# Patient Record
Sex: Male | Born: 1940 | Race: White | Hispanic: No | Marital: Married | Smoking: Former smoker
Health system: Southern US, Community
[De-identification: ages and names within clinical notes are randomized; demographics above are authoritative.]

## PROBLEM LIST (undated history)

## (undated) DIAGNOSIS — H5200 Hypermetropia, unspecified eye: Secondary | ICD-10-CM

## (undated) DIAGNOSIS — T8859XA Other complications of anesthesia, initial encounter: Secondary | ICD-10-CM

## (undated) DIAGNOSIS — H524 Presbyopia: Secondary | ICD-10-CM

## (undated) DIAGNOSIS — M48062 Spinal stenosis, lumbar region with neurogenic claudication: Secondary | ICD-10-CM

## (undated) DIAGNOSIS — G7000 Myasthenia gravis without (acute) exacerbation: Secondary | ICD-10-CM

## (undated) DIAGNOSIS — C439 Malignant melanoma of skin, unspecified: Secondary | ICD-10-CM

## (undated) DIAGNOSIS — T4145XA Adverse effect of unspecified anesthetic, initial encounter: Secondary | ICD-10-CM

## (undated) DIAGNOSIS — R7303 Prediabetes: Secondary | ICD-10-CM

## (undated) DIAGNOSIS — Z9289 Personal history of other medical treatment: Secondary | ICD-10-CM

## (undated) DIAGNOSIS — Z8719 Personal history of other diseases of the digestive system: Secondary | ICD-10-CM

## (undated) DIAGNOSIS — C8590 Non-Hodgkin lymphoma, unspecified, unspecified site: Secondary | ICD-10-CM

## (undated) DIAGNOSIS — H3322 Serous retinal detachment, left eye: Secondary | ICD-10-CM

## (undated) DIAGNOSIS — E119 Type 2 diabetes mellitus without complications: Secondary | ICD-10-CM

## (undated) DIAGNOSIS — J189 Pneumonia, unspecified organism: Secondary | ICD-10-CM

## (undated) DIAGNOSIS — K219 Gastro-esophageal reflux disease without esophagitis: Secondary | ICD-10-CM

## (undated) DIAGNOSIS — M1990 Unspecified osteoarthritis, unspecified site: Secondary | ICD-10-CM

## (undated) DIAGNOSIS — I499 Cardiac arrhythmia, unspecified: Secondary | ICD-10-CM

## (undated) DIAGNOSIS — I10 Essential (primary) hypertension: Secondary | ICD-10-CM

## (undated) HISTORY — DX: Myasthenia gravis without (acute) exacerbation: G70.00

## (undated) HISTORY — DX: Hypermetropia, unspecified eye: H52.00

## (undated) HISTORY — PX: MIDDLE EAR SURGERY: SHX713

## (undated) HISTORY — PX: COLONOSCOPY: SHX174

## (undated) HISTORY — DX: Spinal stenosis, lumbar region with neurogenic claudication: M48.062

## (undated) HISTORY — DX: Malignant melanoma of skin, unspecified: C43.9

## (undated) HISTORY — DX: Presbyopia: H52.4

## (undated) HISTORY — PX: HERNIA REPAIR: SHX51

## (undated) HISTORY — DX: Essential (primary) hypertension: I10

---

## 2001-08-23 ENCOUNTER — Ambulatory Visit (HOSPITAL_COMMUNITY): Admission: RE | Admit: 2001-08-23 | Discharge: 2001-08-23 | Payer: Self-pay | Admitting: Gastroenterology

## 2001-08-23 ENCOUNTER — Encounter (INDEPENDENT_AMBULATORY_CARE_PROVIDER_SITE_OTHER): Payer: Self-pay | Admitting: Specialist

## 2004-10-23 ENCOUNTER — Encounter (INDEPENDENT_AMBULATORY_CARE_PROVIDER_SITE_OTHER): Payer: Self-pay | Admitting: Family Medicine

## 2004-10-23 LAB — CONVERTED CEMR LAB

## 2004-12-05 ENCOUNTER — Encounter (INDEPENDENT_AMBULATORY_CARE_PROVIDER_SITE_OTHER): Payer: Self-pay | Admitting: Specialist

## 2004-12-05 ENCOUNTER — Ambulatory Visit (HOSPITAL_COMMUNITY): Admission: RE | Admit: 2004-12-05 | Discharge: 2004-12-05 | Payer: Self-pay | Admitting: Gastroenterology

## 2005-04-11 ENCOUNTER — Ambulatory Visit (HOSPITAL_COMMUNITY): Admission: RE | Admit: 2005-04-11 | Discharge: 2005-04-11 | Payer: Self-pay | Admitting: Family Medicine

## 2006-06-15 ENCOUNTER — Ambulatory Visit: Payer: Self-pay | Admitting: Family Medicine

## 2006-06-21 ENCOUNTER — Ambulatory Visit: Payer: Self-pay | Admitting: Family Medicine

## 2006-07-19 ENCOUNTER — Ambulatory Visit: Payer: Self-pay | Admitting: Family Medicine

## 2006-07-27 ENCOUNTER — Ambulatory Visit (HOSPITAL_COMMUNITY): Admission: RE | Admit: 2006-07-27 | Discharge: 2006-07-27 | Payer: Self-pay | Admitting: *Deleted

## 2006-08-30 ENCOUNTER — Ambulatory Visit: Payer: Self-pay | Admitting: Family Medicine

## 2006-08-30 LAB — CONVERTED CEMR LAB
ALT: 26 units/L (ref 0–40)
Cholesterol: 258 mg/dL (ref 0–200)
HDL: 36.1 mg/dL — ABNORMAL LOW (ref >39.0)
LDL DIRECT: 163.5 mg/dL
Uric Acid, Serum: 6 mg/dL (ref 2.4–7.0)

## 2006-11-15 ENCOUNTER — Ambulatory Visit (HOSPITAL_COMMUNITY): Admission: RE | Admit: 2006-11-15 | Discharge: 2006-11-15 | Payer: Self-pay | Admitting: Orthopedic Surgery

## 2006-11-16 ENCOUNTER — Ambulatory Visit: Payer: Self-pay | Admitting: Family Medicine

## 2006-11-27 ENCOUNTER — Ambulatory Visit: Payer: Self-pay | Admitting: Family Medicine

## 2006-11-27 LAB — CONVERTED CEMR LAB
AST: 16 units/L (ref 0–37)
Cholesterol: 230 mg/dL (ref 0–200)
HDL: 40.2 mg/dL (ref >39.0)
Triglycerides: 275 mg/dL (ref 0–149)

## 2007-01-28 DIAGNOSIS — F528 Other sexual dysfunction not due to a substance or known physiological condition: Secondary | ICD-10-CM

## 2007-01-28 DIAGNOSIS — K219 Gastro-esophageal reflux disease without esophagitis: Secondary | ICD-10-CM

## 2007-10-24 HISTORY — PX: JOINT REPLACEMENT: SHX530

## 2007-12-19 ENCOUNTER — Ambulatory Visit: Payer: Self-pay | Admitting: Family Medicine

## 2007-12-19 DIAGNOSIS — E785 Hyperlipidemia, unspecified: Secondary | ICD-10-CM | POA: Insufficient documentation

## 2007-12-19 DIAGNOSIS — I10 Essential (primary) hypertension: Secondary | ICD-10-CM | POA: Insufficient documentation

## 2008-01-02 ENCOUNTER — Ambulatory Visit: Payer: Self-pay | Admitting: Family Medicine

## 2008-01-05 LAB — CONVERTED CEMR LAB
AST: 22 units/L (ref 0–37)
Cholesterol: 171 mg/dL (ref 0–200)
HDL: 45.3 mg/dL (ref >39.0)
Total CHOL/HDL Ratio: 3.8

## 2008-01-06 ENCOUNTER — Encounter (INDEPENDENT_AMBULATORY_CARE_PROVIDER_SITE_OTHER): Payer: Self-pay | Admitting: *Deleted

## 2008-01-30 ENCOUNTER — Ambulatory Visit: Payer: Self-pay | Admitting: Internal Medicine

## 2008-01-30 DIAGNOSIS — M1990 Unspecified osteoarthritis, unspecified site: Secondary | ICD-10-CM | POA: Insufficient documentation

## 2008-02-03 ENCOUNTER — Encounter (INDEPENDENT_AMBULATORY_CARE_PROVIDER_SITE_OTHER): Payer: Self-pay | Admitting: *Deleted

## 2008-02-03 LAB — CONVERTED CEMR LAB
BUN: 13 mg/dL (ref 6–23)
Basophils Relative: 0.5 % (ref 0.0–1.0)
CO2: 31 meq/L (ref 19–32)
Chloride: 103 meq/L (ref 96–112)
Creatinine, Ser: 1 mg/dL (ref 0.4–1.5)
Eosinophils Absolute: 0.2 10*3/uL (ref 0.0–0.7)
Eosinophils Relative: 2.5 % (ref 0.0–5.0)
Lymphocytes Relative: 23.5 % (ref 12.0–46.0)
MCV: 90.7 fL (ref 78.0–100.0)
Neutrophils Relative %: 63.6 % (ref 43.0–77.0)
RBC: 4.76 M/uL (ref 4.22–5.81)
WBC: 6.7 10*3/uL (ref 4.5–10.5)

## 2008-03-11 ENCOUNTER — Ambulatory Visit: Payer: Self-pay | Admitting: Internal Medicine

## 2008-03-13 ENCOUNTER — Encounter: Payer: Self-pay | Admitting: Internal Medicine

## 2008-03-24 ENCOUNTER — Inpatient Hospital Stay (HOSPITAL_COMMUNITY): Admission: RE | Admit: 2008-03-24 | Discharge: 2008-03-26 | Payer: Self-pay | Admitting: Orthopedic Surgery

## 2008-05-13 ENCOUNTER — Ambulatory Visit: Payer: Self-pay | Admitting: Internal Medicine

## 2008-05-18 LAB — CONVERTED CEMR LAB
HDL: 41.1 mg/dL (ref >39.0)
LDL Cholesterol: 99 mg/dL (ref 0–99)
Total CHOL/HDL Ratio: 4.2
VLDL: 31 mg/dL (ref 0–40)

## 2008-09-03 ENCOUNTER — Encounter (INDEPENDENT_AMBULATORY_CARE_PROVIDER_SITE_OTHER): Payer: Self-pay | Admitting: *Deleted

## 2008-11-12 ENCOUNTER — Ambulatory Visit: Payer: Self-pay | Admitting: Internal Medicine

## 2008-11-12 LAB — CONVERTED CEMR LAB
Glucose, Urine, Semiquant: NEGATIVE
Specific Gravity, Urine: <1.005
WBC Urine, dipstick: NEGATIVE
pH: 6

## 2008-11-16 LAB — CONVERTED CEMR LAB
ALT: 24 units/L (ref 0–53)
AST: 20 units/L (ref 0–37)
Basophils Absolute: 0 10*3/uL (ref 0.0–0.1)
GFR calc Af Amer: 109 mL/min
Glucose, Bld: 95 mg/dL (ref 70–99)
HCT: 41.7 % (ref 39.0–52.0)
Lymphocytes Relative: 22.6 % (ref 12.0–46.0)
Monocytes Absolute: 0.6 10*3/uL (ref 0.1–1.0)
Monocytes Relative: 9.9 % (ref 3.0–12.0)
Platelets: 225 10*3/uL (ref 150–400)
Potassium: 3.8 meq/L (ref 3.5–5.1)
RDW: 11.8 % (ref 11.5–14.6)
Sodium: 140 meq/L (ref 135–145)

## 2009-05-18 ENCOUNTER — Ambulatory Visit: Payer: Self-pay | Admitting: Internal Medicine

## 2009-05-28 LAB — CONVERTED CEMR LAB
ALT: 22 units/L (ref 0–53)
CO2: 31 meq/L (ref 19–32)
Calcium: 9 mg/dL (ref 8.4–10.5)
Chloride: 102 meq/L (ref 96–112)
Glucose, Bld: 104 mg/dL — ABNORMAL HIGH (ref 70–99)
Potassium: 4.3 meq/L (ref 3.5–5.1)
Sodium: 140 meq/L (ref 135–145)
Triglycerides: 222 mg/dL — ABNORMAL HIGH (ref 0.0–149.0)

## 2009-06-08 ENCOUNTER — Ambulatory Visit: Payer: Self-pay | Admitting: Internal Medicine

## 2009-11-17 ENCOUNTER — Telehealth (INDEPENDENT_AMBULATORY_CARE_PROVIDER_SITE_OTHER): Payer: Self-pay | Admitting: *Deleted

## 2009-11-19 ENCOUNTER — Telehealth (INDEPENDENT_AMBULATORY_CARE_PROVIDER_SITE_OTHER): Payer: Self-pay | Admitting: *Deleted

## 2009-11-19 ENCOUNTER — Ambulatory Visit: Payer: Self-pay | Admitting: Internal Medicine

## 2009-12-03 ENCOUNTER — Ambulatory Visit: Payer: Self-pay | Admitting: Internal Medicine

## 2009-12-06 LAB — CONVERTED CEMR LAB
BUN: 10 mg/dL (ref 6–23)
Basophils Absolute: 0 10*3/uL (ref 0.0–0.1)
CO2: 30 meq/L (ref 19–32)
Chloride: 103 meq/L (ref 96–112)
Glucose, Bld: 93 mg/dL (ref 70–99)
HCT: 42.1 % (ref 39.0–52.0)
Hemoglobin: 14.1 g/dL (ref 13.0–17.0)
Lymphs Abs: 1.6 10*3/uL (ref 0.7–4.0)
MCHC: 33.4 g/dL (ref 30.0–36.0)
MCV: 92.3 fL (ref 78.0–100.0)
Monocytes Absolute: 0.6 10*3/uL (ref 0.1–1.0)
Neutro Abs: 4.4 10*3/uL (ref 1.4–7.7)
Platelets: 205 10*3/uL (ref 150.0–400.0)
Potassium: 4.2 meq/L (ref 3.5–5.1)
RDW: 12 % (ref 11.5–14.6)
TSH: 0.61 microintl units/mL (ref 0.35–5.50)

## 2010-03-02 ENCOUNTER — Ambulatory Visit: Payer: Self-pay | Admitting: Internal Medicine

## 2010-03-04 LAB — CONVERTED CEMR LAB
HDL: 45.1 mg/dL (ref >39.00)
Total CHOL/HDL Ratio: 4
VLDL: 52.6 mg/dL — ABNORMAL HIGH (ref 0.0–40.0)

## 2010-05-31 ENCOUNTER — Encounter: Payer: Self-pay | Admitting: Internal Medicine

## 2010-06-03 ENCOUNTER — Telehealth (INDEPENDENT_AMBULATORY_CARE_PROVIDER_SITE_OTHER): Payer: Self-pay | Admitting: *Deleted

## 2010-08-04 ENCOUNTER — Encounter: Payer: Self-pay | Admitting: Internal Medicine

## 2010-08-11 ENCOUNTER — Encounter (INDEPENDENT_AMBULATORY_CARE_PROVIDER_SITE_OTHER): Payer: Self-pay | Admitting: *Deleted

## 2010-09-05 ENCOUNTER — Ambulatory Visit: Payer: Self-pay | Admitting: Internal Medicine

## 2010-11-24 NOTE — Assessment & Plan Note (Signed)
Summary: rto 6 months/cbs   Vital Signs:  Patient profile:   70 year old male Weight:      275.13 pounds Pulse rate:   66 / minute Pulse rhythm:   regular BP sitting:   132 / 82  (left arm) Cuff size:   large  Vitals Entered By: Army Fossa CMA (September 05, 2010 8:31 AM) CC: 6 month f/u- fasting  Comments [facility] [address]    History of Present Illness: routine office visit   diet has not been as good recently No exercising as much recently  due to aches and pains  Current Medications (verified): 1)  Meloxicam 7.5 Mg  Tabs (Meloxicam) .... 1/2 By Mouth Once Daily 2)  Simvastatin 80 Mg  Tabs (Simvastatin) .... Takeone Tablet Daily 3)  Fish Oil 1000 Mg  Caps (Omega-3 Fatty Acids) .... Take One Capsule Daily 4)  Aspirin 81 Mg Tbec (Aspirin) .... Once Daily 5)  Losartan Potassium 50 Mg Tabs (Losartan Potassium) .Marland Kitchen.. 1 By Mouth Once Daily  Allergies (verified): No Known Drug Allergies  Past History:  Past Medical History: Reviewed history from 12/03/2009 and no changes required. OA Dyslipidemia GERD   HTN ERECTILE DYSFUNCTION  adenomatous colon polyps,  h/o tics GI Dr Matthias Hughs  Past Surgical History: Reviewed history from 05/13/2008 and no changes required. 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 GI:  GERD symptoms well controlled. MS:  back pain has improved lately He does have knee pain from time to time, saw  his orthopedic surgeon, recommended no surgery and keep himself active.  Physical Exam  General:  alert, well-developed, and overweight-appearing.   Lungs:   normal respiratory effort, no intercostal retractions, no accessory muscle use, and normal breath sounds.   Heart:  normal rate, regular rhythm, and no murmur.   Extremities:  trace bilateral lower extremity edema Psych:  not anxious appearing and not depressed appearing.     Impression & Recommendations:  Problem # 1:  OSTEOARTHRITIS (ICD-715.90) see review of systems back pain better, some knee pain. Recommend to use a knee sleeve and keep himself active.    His updated medication list for this problem includes:    Meloxicam 7.5 Mg Tabs (Meloxicam) .Marland Kitchen... 1/2 by mouth once daily    Aspirin 81 Mg Tbec (Aspirin) ..... Once daily  Problem # 2:  DYSLIPIDEMIA (ICD-272.4) on a high dose of simvastatin for years, tolerating well all previous FLP reviewed w/ pt ,  aware that the triglycerides are slightly elevated No change Diet and exercise! His updated medication list for this problem includes:    Simvastatin 80 Mg Tabs (Simvastatin) .Marland Kitchen... Takeone tablet daily  Labs Reviewed: SGOT: 20 (12/03/2009)   SGPT: 25 (12/03/2009)   HDL:45.10 (03/02/2010), 43.70 (05/18/2009)  LDL:99 (05/13/2008), 99 (01/02/2008)  Chol:170 (03/02/2010), 158 (05/18/2009)  Trig:263.0 (03/02/2010), 222.0 (05/18/2009)  Problem # 3:  HYPERTENSION (ICD-401.9) at goal  His updated medication list for this problem includes:    Losartan Potassium 50 Mg Tabs (Losartan potassium) .Marland Kitchen... 1 by mouth once daily  BP today: 132/82 Prior BP: 132/80 (03/02/2010)  Labs Reviewed: K+: 4.2 (12/03/2009) Creat: : 1.0 (  12/03/2009)   Chol: 170 (03/02/2010)   HDL: 45.10 (03/02/2010)   LDL: 99 (05/13/2008)   TG: 263.0 (03/02/2010)  Complete Medication List: 1)  Meloxicam 7.5 Mg Tabs (Meloxicam) .... 1/2 by mouth once daily 2)  Simvastatin 80 Mg Tabs (Simvastatin) .... Takeone tablet daily 3)  Fish Oil 1000 Mg Caps (Omega-3 fatty acids) .... Take one capsule daily 4)  Aspirin 81 Mg Tbec (Aspirin) .... Once daily 5)  Losartan  Potassium 50 Mg Tabs (Losartan potassium) .Marland Kitchen.. 1 by mouth once daily  Patient Instructions: 1)  Please schedule a follow-up appointment by 2- 2012, physical exam, fasting   Orders Added: 1)  Est. Patient Level III [10272]

## 2010-11-24 NOTE — Progress Notes (Signed)
Summary: REFILL REQUEST  Phone Note Refill Request Message from:  Pharmacy on June 03, 2010 1:04 PM  Refills Requested: Medication #1:  LOSARTAN POTASSIUM 50 MG TABS 1 by mouth once daily.   Dosage confirmed as above?Dosage Confirmed   Supply Requested: 1 month   Last Refilled: 03/02/2010   Notes: PT WILL BE GOING OUT OF COUNTRY AND NEEDS REFILL ASAP [facility] [address]  Next Appointment Scheduled: NOV.14TH 2011 Initial call taken by: Lavell Islam,  June 03, 2010 1:05 PM  Follow-up for Phone Call        Spoke with pharmacist Trinna Post) he has refills on file. Army Fossa CMA  June 03, 2010 1:13 PM

## 2010-11-24 NOTE — Miscellaneous (Signed)
Summary: flu shot @ [facility]    Immunization History:  Influenza Immunization History:    Influenza:  given @ [facility]  (08/04/2010)

## 2010-11-24 NOTE — Medication Information (Signed)
Summary: [facility]  [facility]ose[facility]   Imported By: Lanelle Bal 06/06/2010 13:18:19  _____________________________________________________________________  External Attachment:    Type:   Image     Comment:   External Document

## 2010-11-24 NOTE — Progress Notes (Signed)
Summary: simvastatin refill   Phone Note Refill Request Message from:  Fax from Pharmacy on November 17, 2009 9:17 AM  Refills Requested: Medication #1:  SIMVASTATIN 80 MG  TABS Takeone tablet daily   Dosage confirmed as above?Dosage [facility] [address]  Next Appointment Scheduled: 11/19/09 Dr.[name] Initial call taken by: Michaelle Copas,  November 17, 2009 9:19 AM    Prescriptions: SIMVASTATIN 80 MG  TABS (SIMVASTATIN) Takeone tablet daily  #30 Tablet x 5   Entered by:   Doristine Devoid   Authorized by:   Nolon Rod. [name] MD   Signed by:   Doristine Devoid on 11/17/2009   Method used:   [facility]        [facility]  [address] [id number]       [address]       [place], [place]  [address]       Ph: [phone] or [phone]       Fax: [phone]   RxID:   [id number]

## 2010-11-24 NOTE — Assessment & Plan Note (Signed)
Summary: RTO 6 MONTHS.CBS   Vital Signs:  Patient profile:   70 year old male Height:      72 inches Weight:      270.2 pounds BMI:     36.78 Pulse rate:   60 / minute BP sitting:   140 / 80  Vitals Entered By: Shary Decamp (November 19, 2009 10:08 AM) CC: rov - fasting   History of Present Illness: here for routine office visit, feels well  Current Medications (verified): 1)  Meloxicam 7.5 Mg  Tabs (Meloxicam) .... 1/2 By Mouth Once Daily 2)  Simvastatin 80 Mg  Tabs (Simvastatin) .... Takeone Tablet Daily 3)  Fish Oil 1000 Mg  Caps (Omega-3 Fatty Acids) .... Take One Capsule Daily 4)  Aspirin 81 Mg Tbec (Aspirin) .... Once Daily  Allergies (verified): No Known Drug Allergies  Past History:  Past Medical History: OA Dyslipidemia GERD   HTN ERECTILE DYSFUNCTION  adenomatous colon polyps,  h/o tics GI Dr Matthias Hughs  Past Surgical History: Reviewed history from 05/13/2008 and no changes required. 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 Occupation:  Programme researcher, broadcasting/film/video, still working, very active  Married, 2 living children, lost 2 kids  Never Smoked Alcohol use-no Drug use-no Regular exercise-no  Review of Systems       since the last office visit, the patient is taking half of his meloxicam on OA  pain is a still well controlled BPs are checked at [facility], they vary from 140 to even 170 at times his diet was very good until December, he is already trying  to go back on a more healthy diet he is eating low-salt food  Physical Exam  General:  alert and well-developed.   Lungs:  normal respiratory effort, no intercostal retractions, no accessory muscle use, and normal breath sounds.   Heart:  normal rate, regular rhythm, and no murmur.   Extremities:  no pretibial edema bilaterally    Impression  & Recommendations:  Problem # 1:  HYPERTENSION (ICD-401.9) his blood pressure has been modestly elevated for a while, he still have systolic readings of 170 Plan: Start losartan, see instructions  His updated medication list for this problem includes:    Losartan Potassium 25 Mg Tabs (Losartan potassium) ..... One tablet a day for one week, then take two tablets daily  Orders: Prescription Created Electronically [phone])  BP today: 140/80 Prior BP: 140/70 (06/08/2009)  Labs Reviewed: K+: 4.3 (05/18/2009) Creat: : 1.1 (05/18/2009)   Chol: 158 (05/18/2009)   HDL: 43.70 (05/18/2009)   LDL: 99 (05/13/2008)   TG: 222.0 (05/18/2009)  Problem # 2:  OSTEOARTHRITIS (ICD-715.90) symptoms well controlled w/ 1/2 meloxicam His updated medication list for this problem includes:    Meloxicam 7.5 Mg Tabs (Meloxicam) .Marland Kitchen... 1/2 by mouth once daily    Aspirin 81 Mg Tbec (Aspirin) ..... Once daily  Complete Medication List: 1)  Meloxicam 7.5 Mg Tabs (Meloxicam) .... 1/2 by mouth once daily 2)  Simvastatin 80 Mg Tabs (Simvastatin) .... Takeone tablet daily 3)  Fish Oil 1000 Mg Caps (Omega-3 fatty acids) .... Take one capsule daily 4)  Aspirin 81 Mg Tbec (Aspirin) .... Once daily 5)  Losartan Potassium 25 Mg Tabs (Losartan potassium) .... One tablet a day for one week, then take two tablets daily  Patient Instructions: 1)  start  losartan 25 mg daily,  then increase to 50 mg daily 2)  call if problems or side effects 3)  please come back in two weeks for a complete physical exam Prescriptions: LOSARTAN POTASSIUM 25 MG TABS (LOSARTAN POTASSIUM) one tablet a day for one week, then take two tablets daily  #60 x 1   Entered and Authorized by:   Elita Quick E. Mandalyn Pasqua MD   Signed by:   Nolon Rod. Madalaine Portier MD on 11/19/2009   Method used:   [facility]        [facility]  [address] [id number]       [address]       [place], [place]  [address]       Ph: [phone] or [phone]       Fax:  [phone]   RxID:   [id number]

## 2010-11-24 NOTE — Assessment & Plan Note (Signed)
Summary: [name] [name]-- /ns/kdc   Vital Signs:  Patient profile:   70 year old male Height:      72 inches Weight:      270 pounds BMI:     36.75 Pulse rate:   76 / minute BP sitting:   160 / 72  Vitals Entered By: Shary Decamp (December 03, 2009 11:13 AM) CC: yearly - fasting Is Patient Diabetic? No   History of Present Illness: OA-- on meloxicam, 1/2 tab a day, symptoms well controlled   Dyslipidemia-- good medication compliance   HTN--ambulatory BPs in the 140-150/80s range     yearly checkup, chart review  Preventive Screening-Counseling & Management  Alcohol-Tobacco     Smoking Status: quit     Year Quit: quit 45 years ago  Caffeine-Diet-Exercise     Does Patient Exercise: yes     Type of exercise: walks, rides bike     Times/week: 7  Current Medications (verified): 1)  Meloxicam 7.5 Mg  Tabs (Meloxicam) .... 1/2 By Mouth Once Daily 2)  Simvastatin 80 Mg  Tabs (Simvastatin) .... Takeone Tablet Daily 3)  Fish Oil 1000 Mg  Caps (Omega-3 Fatty Acids) .... Take One Capsule Daily 4)  Aspirin 81 Mg Tbec (Aspirin) .... Once Daily 5)  Losartan Potassium 25 Mg Tabs (Losartan Potassium) .... One Tablet A Day For One Week, Then Take Two Tablets Daily  Allergies (verified): No Known Drug Allergies  Past History:  Past Medical History: OA Dyslipidemia GERD   HTN ERECTILE DYSFUNCTION  adenomatous colon polyps,  h/o tics GI Dr Matthias Hughs  Past Surgical History: Reviewed history from 05/13/2008 and no changes required. 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 colon ca--no prostate ca--no CAD - no DM - bro HTN - no stroke - no  Social History: Reviewed history from 11/12/2008 and no changes required. 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 Resp:  Denies cough and shortness of breath. GI:  Denies bloody stools, diarrhea, nausea, and vomiting. GU:  Denies hematuria, urinary frequency, and urinary hesitancy. Psych:  Denies anxiety and depression.  Physical Exam  General:  alert and well-developed.   Neck:  no masses, no thyromegaly, and normal carotid upstroke.   Lungs:  normal respiratory effort, no intercostal retractions, no accessory muscle use, and normal breath sounds.   Heart:  normal rate, regular rhythm, no murmur, and no gallop.   Abdomen:  soft, non-tender, and no distention.   Rectal:  No external abnormalities noted. Normal sphincter tone. No rectal masses or tenderness. Prostate:  Prostate gland firm and smooth, no enlargement, nodularity, tenderness, mass, asymmetry or induration. Extremities:  no edema Psych:  Cognition and judgment appear intact. Alert and cooperative with normal attention span and concentration. not anxious appearing and not depressed appearing.     Impression & Recommendations:  Problem # 1:  DYSLIPIDEMIA (ICD-272.4) at goal labs His updated medication list for this problem includes:    Simvastatin 80 Mg Tabs (Simvastatin) .Marland Kitchen... Takeone tablet daily  Orders: TLB-TSH (Thyroid Stimulating Hormone) (84443-TSH) TLB-ALT (SGPT) (84460-ALT) TLB-AST (SGOT) (84450-SGOT)  Problem # 2:  PREVENTIVE HEALTH CARE (ICD-V70.0) Td 09 pneumonia shot--2010 had a flu shot    PSA:  1.62 (01/02/2008)     Colonoscopy:  Adenomatous Polyp (12/17/2006)-- next 5 years    doing well, encouraged to continue being physically active  Problem # 3:  HYPERTENSION (ICD-401.9) at goal ? increased losartan from 25 to 50 just a day ago  low-salt diet see instructions  His updated medication list  for this problem includes:    Losartan Potassium 25 Mg Tabs (Losartan potassium) .Marland Kitchen... 2 tabs a day  BP today: 160/72 Prior BP: 140/80 (11/19/2009)  Labs Reviewed: K+: 4.3 (05/18/2009) Creat: : 1.1 (05/18/2009)   Chol: 158 (05/18/2009)   HDL: 43.70 (05/18/2009)   LDL: 99 (05/13/2008)   TG: 222.0 (05/18/2009)  Problem # 4:  OSTEOARTHRITIS (ICD-715.90) symptoms well controlled with meloxicam His updated medication list for this problem includes:    Meloxicam 7.5 Mg Tabs (Meloxicam) .Marland Kitchen... 1/2 by mouth once daily    Aspirin 81 Mg Tbec (Aspirin) ..... Once daily  Complete Medication List: 1)  Meloxicam 7.5 Mg Tabs (Meloxicam) .... 1/2 by mouth once daily 2)  Simvastatin 80 Mg Tabs (Simvastatin) .... Takeone tablet daily 3)  Fish Oil 1000 Mg Caps (Omega-3 fatty acids) .... Take one capsule daily 4)  Aspirin 81 Mg Tbec (Aspirin) .... Once daily 5)  Losartan Potassium 25 Mg Tabs (Losartan potassium) .... 2 tabs a day  Other Orders: Venipuncture (52841) TLB-BMP (Basic Metabolic Panel-BMET) (80048-METABOL) TLB-CBC Platelet - w/Differential (85025-CBCD) TLB-PSA (Prostate Specific Antigen) (84153-PSA)  Patient Instructions: 1)  low-salt diet 2)  exercise daily for 30 minutes 3)  increase losartan to 50mg  a day 4)  Check your blood pressure 2 or 3 times a week. If it is more than 140/85 consistently,please let us know  5)  Please schedule a follow-up appointment in 3 months .    Preventive Care Screening  Prior Values:    PSA:  1.62 (01/02/2008)    Colonoscopy:  Adenomatous Polyp (12/17/2006)    Last Tetanus Booster:  Tdap (12/19/2007)    Last Pneumovax:  Pneumovax (11/12/2008)    Risk Factors:  Tobacco use:  quit    Year quit:  quit 45 years ago Passive smoke exposure:  no Drug use:  no HIV high-risk behavior:  no Caffeine use:  1 drinks per day Alcohol use:  no Exercise:  yes    Times per week:  7    Type:  walks, rides bike Seatbelt use:  95 % Sun Exposure:   occasionally  Colonoscopy History:    Date of Last Colonoscopy:  12/17/2006

## 2010-11-24 NOTE — Progress Notes (Signed)
Summary: prior authorixation approved for [facility]  Phone Note Refill Request Message from:  [facility] [address] fax [phone]  Refills Requested: Medication #1:  LOSARTAN POTASSIUM 25 MG TABS one tablet a day for one week please call for PA  [phone]  Initial call taken by: Barb Merino,  November 19, 2009 11:50 AM  Follow-up for Phone Call        Prior Auth Approved for Losartan through 11/26/09.  [facility]Please note Losartan 50 mg #1 per day will process without authorization.Kandice Hams  November 22, 2009 9:40 AM  Follow-up by: Kandice Hams,  November 22, 2009 9:41 AM

## 2010-11-24 NOTE — Miscellaneous (Signed)
Summary: [facility]   Imported By: Lanelle Bal 08/22/2010 09:41:46  _____________________________________________________________________  External Attachment:    Type:   Image     Comment:   External Document

## 2010-11-24 NOTE — Assessment & Plan Note (Signed)
Summary: 3 MONTH FOLLOWUP///SPH   Vital Signs:  Patient profile:   70 year old male Height:      72 inches Weight:      273.8 pounds BMI:     37.27 Pulse rate:   64 / minute BP sitting:   132 / 80  Vitals Entered By: Shary Decamp (Mar 02, 2010 8:01 AM) CC: rov, fasting Comments  - pt is seeing Dr. Charlann Boxer for back pain Shary Decamp  Mar 02, 2010 8:05 AM    History of Present Illness: ROV has  low back pain w/  radiation to the *R*  leg leg, status post evaluation and treatment by orthopedic surgery, and they are planning surgery if he's not better. doing physical therapy which seems to be helping   Current Medications (verified): 1)  Meloxicam 7.5 Mg  Tabs (Meloxicam) .... 1/2 By Mouth Once Daily 2)  Simvastatin 80 Mg  Tabs (Simvastatin) .... Takeone Tablet Daily 3)  Fish Oil 1000 Mg  Caps (Omega-3 Fatty Acids) .... Take One Capsule Daily 4)  Aspirin 81 Mg Tbec (Aspirin) .... Once Daily 5)  Losartan Potassium 25 Mg Tabs (Losartan Potassium) .... 2 Tabs A Day  Allergies (verified): No Known Drug Allergies  Past History:  Past Medical History: Reviewed history from 12/03/2009 and no changes required. OA Dyslipidemia GERD   HTN ERECTILE DYSFUNCTION  adenomatous colon polyps,  h/o tics GI Dr Matthias Hughs  Past Surgical History: Reviewed history from 05/13/2008 and no changes required. 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  No apparent side effects denies any bladder or bowel incontinence with the back  pain  Physical Exam  General:  alert and well-developed.   Lungs:  normal respiratory effort, no intercostal retractions, no accessory muscle use, and normal breath sounds.   Heart:  normal rate, regular rhythm, and no murmur.   Extremities:  trace bilateral lower extremity edema Psych:  Cognition and judgment appear intact. Alert and cooperative with normal attention span and concentration. not anxious appearing and not depressed appearing.     Impression & Recommendations:  Problem # 1:  OSTEOARTHRITIS (ICD-715.90) has low back pain with radiation to the right leg, slightly improving with conservative treatment but they are considering surgery His updated medication list for this problem includes:    Meloxicam 7.5 Mg Tabs (Meloxicam) .Marland Kitchen... 1/2 by mouth once daily    Aspirin 81 Mg Tbec (Aspirin) ..... Once daily  Problem # 2:  DYSLIPIDEMIA (ICD-272.4) due for labs His updated medication list for this problem includes:    Simvastatin 80 Mg Tabs (Simvastatin) .Marland Kitchen... Takeone tablet daily  Orders: Venipuncture (57846) TLB-Lipid Panel (80061-LIPID)  Labs Reviewed: SGOT: 20 (12/03/2009)   SGPT: 25 (12/03/2009)   HDL:43.70 (05/18/2009), 41.1 (05/13/2008)  LDL:99 (05/13/2008), 99 (01/02/2008)  Chol:158 (05/18/2009), 171 (05/13/2008)  Trig:222.0 (05/18/2009), 155 (05/13/2008)  Problem # 3:  HYPERTENSION (ICD-401.9)  well-controlled with losartan 50 mg, not change His updated medication list for this problem includes:    Losartan Potassium 50 Mg Tabs (Losartan potassium) .Marland Kitchen... 1 by  mouth once daily  BP today: 132/80 Prior BP: 160/72 (12/03/2009)  Labs Reviewed: K+: 4.2 (12/03/2009) Creat: : 1.0 (12/03/2009)   Chol: 158 (05/18/2009)   HDL: 43.70 (05/18/2009)   LDL: 99 (05/13/2008)   TG: 222.0 (05/18/2009)  Orders: Prescription Created Electronically [id number])  Complete Medication List: 1)  Meloxicam 7.5 Mg Tabs (Meloxicam) .... 1/2 by mouth once daily 2)  Simvastatin 80 Mg Tabs  (Simvastatin) .... Takeone tablet daily 3)  Fish Oil 1000 Mg Caps (Omega-3 fatty acids) .... Take one capsule daily 4)  Aspirin 81 Mg Tbec (Aspirin) .... Once daily 5)  Losartan Potassium 50 Mg Tabs (Losartan potassium) .Marland Kitchen.. 1 by mouth once daily  Patient Instructions: 1)  Please schedule a follow-up appointment in 6 months .  Prescriptions: LOSARTAN POTASSIUM 50 MG TABS (LOSARTAN POTASSIUM) 1 by mouth once daily  #30 x 12   Entered and Authorized by:   Jose E. [name] MD   Signed by:   Nolon Rod. [name] MD on 03/02/2010   Method used:   [facility]  [address] [id number]       [address]       [place], [place]  [address]       Ph: [phone] or [phone]       Fax: [phone]   RxID:   [id number] SIMVASTATIN 80 MG  TABS (SIMVASTATIN) Takeone tablet daily  #30 Tablet x 12   Entered and Authorized by:   Nolon Rod. [name] MD   Signed by:   Nolon Rod. [name] MD on 03/02/2010   Method used:   [facility]        [facility]  [address] [id number]       [address]       [place], [place]  [address]       Ph: [phone] or [phone]       Fax: [phone]   RxID:   [id number]

## 2010-11-24 NOTE — Medication Information (Signed)
Summary: Prior Authorization & Approval for Losartan/[facility]  Prior Authorization & Approval for Losartan/[facility]   Imported By: Lanelle Bal 11/26/2009 14:19:48  _____________________________________________________________________  External Attachment:    Type:   Image     Comment:   External Document

## 2010-12-07 ENCOUNTER — Encounter: Payer: Self-pay | Admitting: Internal Medicine

## 2010-12-07 ENCOUNTER — Other Ambulatory Visit: Payer: Self-pay | Admitting: Internal Medicine

## 2010-12-07 ENCOUNTER — Encounter (INDEPENDENT_AMBULATORY_CARE_PROVIDER_SITE_OTHER): Payer: Medicare Other | Admitting: Internal Medicine

## 2010-12-07 DIAGNOSIS — E785 Hyperlipidemia, unspecified: Secondary | ICD-10-CM

## 2010-12-07 DIAGNOSIS — R109 Unspecified abdominal pain: Secondary | ICD-10-CM | POA: Insufficient documentation

## 2010-12-07 DIAGNOSIS — Z0000 Encounter for general adult medical examination without abnormal findings: Secondary | ICD-10-CM

## 2010-12-07 DIAGNOSIS — Z130 Encounter for screening for diseases of the blood and blood-forming organs and certain disorders involving the immune mechanism: Secondary | ICD-10-CM

## 2010-12-07 DIAGNOSIS — I10 Essential (primary) hypertension: Secondary | ICD-10-CM

## 2010-12-07 DIAGNOSIS — Z125 Encounter for screening for malignant neoplasm of prostate: Secondary | ICD-10-CM

## 2010-12-07 LAB — BASIC METABOLIC PANEL
BUN: 15 mg/dL (ref 6–23)
CO2: 29 mEq/L (ref 19–32)
Calcium: 9 mg/dL (ref 8.4–10.5)
Chloride: 102 mEq/L (ref 96–112)
Creatinine, Ser: 1.1 mg/dL (ref 0.4–1.5)

## 2010-12-07 LAB — ALT: ALT: 24 U/L (ref 0–53)

## 2010-12-07 LAB — CONVERTED CEMR LAB
Bilirubin Urine: NEGATIVE
Blood in Urine, dipstick: NEGATIVE
Ketones, urine, test strip: NEGATIVE
Nitrite: NEGATIVE
Protein, U semiquant: NEGATIVE
Urobilinogen, UA: 0.2

## 2010-12-07 LAB — LIPID PANEL
Cholesterol: 159 mg/dL (ref 0–200)
HDL: 42.5 mg/dL (ref >39.00)
Triglycerides: 223 mg/dL — ABNORMAL HIGH (ref 0.0–149.0)

## 2010-12-07 LAB — AST: AST: 17 U/L (ref 0–37)

## 2010-12-08 LAB — CBC WITH DIFFERENTIAL/PLATELET
Basophils Absolute: 0 10*3/uL (ref 0.0–0.1)
Basophils Relative: 0.4 % (ref 0.0–3.0)
Eosinophils Absolute: 0.2 10*3/uL (ref 0.0–0.7)
Lymphocytes Relative: 25.4 % (ref 12.0–46.0)
MCHC: 34.5 g/dL (ref 30.0–36.0)
MCV: 90.3 fl (ref 78.0–100.0)
Monocytes Absolute: 0.6 10*3/uL (ref 0.1–1.0)
Neutrophils Relative %: 60.1 % (ref 43.0–77.0)
Platelets: 233 10*3/uL (ref 150.0–400.0)
RBC: 4.47 Mil/uL (ref 4.22–5.81)

## 2010-12-14 NOTE — Assessment & Plan Note (Signed)
Summary: YEARLY AND FASTING LABS/SPH/PH   Vital Signs:  Patient profile:   70 year old male Height:      72 inches Weight:      274.13 pounds BMI:     37.31 Pulse rate:   87 / minute Pulse rhythm:   regular BP sitting:   136 / 82  (left arm) Cuff size:   large  Vitals Entered By: Army Fossa CMA (December 07, 2010 9:29 AM) CC: CPX, fasting  Comments wants his Hernia checked [facility] [address]    History of Present Illness: Here for Medicare AWV:  1.   Risk factors based on Past M, S, F history: reviewed  2.   Physical Activities: walks > , 3 times a week 3.   Depression/mood: no problems noted or reported  4.   Hearing: no problems noted or reported  5.   ADL's: independent  6.   Fall Risk: had a fall last year ,prevention discussed  7.   Home Safety: does feel safe at home  8.   Height, weight, &visual acuity: see VS, vision no problems noted or reported  9.   Counseling: yes  10.   Labs ordered based on risk factors: yes   11.           Referral Coordination, if needed  12.           Care Plan, see a/p 13.            Cognitive Assessment: cognition, memory and motor skills seem appropiate  in addition, we discussed the following  8 days ago, he had a sudden onset of moderate to severe pain at the left suprapubic area. symptoms are gradually getting better ROS-----no rash or mass in the area, no heavy lifting recently, no testicular pain, no dysuria or gross hematuria  OA-- on meloxicam, helps his OA sx  , no s/e reported   Dyslipidemia-- good medication compliance, no apparent s/e    HTN-- ambulatory BPs wnl when checked     Preventive Screening-Counseling & Management  Caffeine-Diet-Exercise     Type of exercise: walks  Current Medications (verified): 1)  Meloxicam 7.5 Mg  Tabs (Meloxicam) .... 1/2 By Mouth Once Daily 2)  Simvastatin 80 Mg  Tabs (Simvastatin) .... Takeone Tablet Daily 3)  Fish Oil 1000 Mg  Caps (Omega-3 Fatty Acids) .... Take One  Capsule Daily 4)  Aspirin 81 Mg Tbec (Aspirin) .... Once Daily 5)  Losartan Potassium 50 Mg Tabs (Losartan Potassium) .Marland Kitchen.. 1 By Mouth Once Daily  Allergies (verified): No Known Drug Allergies  Past History:  Past Medical History: Reviewed history from 12/03/2009 and no changes required. OA Dyslipidemia GERD   HTN ERECTILE DYSFUNCTION  adenomatous colon polyps,  h/o tics GI Dr Matthias Hughs  Past Surgical History: Reviewed history from 05/13/2008 and no changes required. 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 colon ca--no prostate ca--no CAD - no DM - bro HTN - no stroke - no  Social History: Reviewed history from 12/03/2009 and no changes required. 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 Resp:  Denies cough, shortness of breath, and wheezing. GI:  Denies bloody stools, diarrhea, nausea, and vomiting. GU:  Denies dysuria, urinary frequency, and urinary hesitancy.  Physical Exam  General:  alert, well-developed, and overweight-appearing.   Neck:  no masses and no thyromegaly.   Lungs:  normal respiratory effort, no intercostal retractions, no accessory muscle use, and normal breath sounds.   Heart:  normal rate, regular rhythm, and no murmur.   Abdomen:  soft, non-tender, no distention, no masses, no guarding, and no rigidity.  Slightly tender at the left lower quadrant without mass or rebound. There is no inguinal hernia on either side, suprapubic area is free of mass or hernia as well Rectal:  No external abnormalities noted. Normal sphincter tone. No rectal masses or tenderness. Prostate:  Prostate gland  firm and smooth, no enlargement, nodularity, tenderness, mass, asymmetry or induration. Extremities:  trace bilateral lower extremity edema Psych:  Oriented X3, memory intact for recent and remote, normally interactive, good eye contact, not anxious appearing, and not depressed appearing.     Impression & Recommendations:  Problem # 1:  PREVENTIVE HEALTH CARE (ICD-V70.0) Td 09 pneumonia shot--2010 had a flu shot   labs      Colonoscopy:  Adenomatous Polyp (12/17/2006)-- next 5 years    encouraged to continue exercise  recommend weight loss, diet discussed  Orders: EKG w/ Interpretation (93000) Medicare -1st Annual Wellness Visit [phone])  Problem # 2:  INGUINAL PAIN (ICD-789.09)  8 days ago developed a sudden left inguinal pain. No mass. On exam, there is no hernia, left lower quadrant is slightly tender. ROS essentially negative. check a Udip observe (in retrospect thinks he sprained a muscle there when he went under the house to change a filter) His updated medication list for this problem includes:    Meloxicam 7.5 Mg Tabs (Meloxicam) .Marland Kitchen... 1/2 by mouth once daily    Aspirin 81 Mg Tbec (Aspirin) ..... Once daily  Orders: UA Dipstick w/o Micro (automated)  (81003)  Problem # 3:  HYPERTENSION (ICD-401.9) EKG showed sinus bradycardia otherwise normal and unchanged from previous. His updated medication list for this problem includes:    Losartan Potassium 50 Mg Tabs (Losartan potassium) .Marland Kitchen... 1 by mouth once daily  Orders: EKG w/ Interpretation (93000) TLB-BMP (Basic Metabolic Panel-BMET) (80048-METABOL) Specimen Handling (60454) EKG w/ Interpretation (93000)  Problem # 4:  DYSLIPIDEMIA (ICD-272.4) labs  His updated medication list for this problem includes:    Simvastatin 80 Mg Tabs (Simvastatin) .Marland Kitchen... Takeone tablet daily  Orders: TLB-ALT (SGPT) (84460-ALT) TLB-AST (SGOT) (84450-SGOT) TLB-Lipid Panel (80061-LIPID) Specimen Handling (09811)  Labs  Reviewed: SGOT: 20 (12/03/2009)   SGPT: 25 (12/03/2009)   HDL:45.10 (03/02/2010), 43.70 (05/18/2009)  LDL:99 (05/13/2008), 99 (01/02/2008)  Chol:170 (03/02/2010), 158 (05/18/2009)  Trig:263.0 (03/02/2010), 222.0 (05/18/2009)  Complete Medication List: 1)  Meloxicam 7.5 Mg Tabs (Meloxicam) .... 1/2 by mouth once daily 2)  Simvastatin 80 Mg Tabs (Simvastatin) .... Takeone tablet daily 3)  Fish Oil 1000 Mg Caps (Omega-3 fatty acids) .... Take one capsule daily 4)  Aspirin 81 Mg Tbec (Aspirin) .... Once daily 5)  Losartan Potassium 50 Mg Tabs (Losartan potassium) .Marland Kitchen.. 1 by mouth once daily  Other Orders: Venipuncture (91478) TLB-CBC Platelet - w/Differential (85025-CBCD) TLB-PSA (Prostate Specific Antigen) (84153-PSA)  Patient Instructions: 1)  Please schedule a follow-up appointment in 6 months .    Orders Added: 1)  EKG w/ Interpretation [93000] 2)  Venipuncture [36415] 3)  TLB-ALT (SGPT) [84460-ALT] 4)  TLB-AST (SGOT) [84450-SGOT] 5)  TLB-BMP (Basic Metabolic Panel-BMET) [80048-METABOL] 6)  TLB-CBC Platelet -  w/Differential [85025-CBCD] 7)  TLB-Lipid Panel [80061-LIPID] 8)  TLB-PSA (Prostate Specific Antigen) [84153-PSA] 9)  Specimen Handling [99000] 10)  UA Dipstick w/o Micro (automated)  [81003] 11)  EKG w/ Interpretation [93000] 12)  Est. Patient Level III [16109] 13)  Medicare -1st Annual Wellness Visit [G0438]     Risk Factors:  Exercise:  yes    Type:  walks     Laboratory Results   Urine Tests    Routine Urinalysis   Color: yellow Appearance: Clear Glucose: negative   (Normal Range: Negative) Bilirubin: negative   (Normal Range: Negative) Ketone: negative   (Normal Range: Negative) Spec. Gravity: 1.020   (Normal Range: 1.003-1.035) Blood: negative   (Normal Range: Negative) pH: 7.0   (Normal Range: 5.0-8.0) Protein: negative   (Normal Range: Negative) Urobilinogen: 0.2   (Normal Range: 0-1) Nitrite: negative   (Normal Range: Negative) Leukocyte  Esterace: negative   (Normal Range: Negative)    Comments: Army Fossa CMA  December 07, 2010 10:33 AM

## 2010-12-15 ENCOUNTER — Telehealth: Payer: Self-pay | Admitting: Internal Medicine

## 2010-12-20 ENCOUNTER — Telehealth: Payer: Self-pay | Admitting: Internal Medicine

## 2010-12-20 NOTE — Progress Notes (Signed)
Summary: referral  Phone Note Call from Patient Call back at Home Phone [phone]   Caller: Spouse Summary of Call: Pts wife called and states that pt is having pain around groin area still. Would like to see a specialist if possible. Please advise. Army Fossa CMA  December 15, 2010 5:05 PM   Follow-up for Phone Call         please arrange a Gen. surgery referral. If the pain is severe, he needs to let us  know Follow-up by: [facility] E. [name] MD,  December 15, 2010 5:37 PM  Additional Follow-up for Phone Call Additional follow up Details #1::        Pts wife states that the pain is not all the time, sometimes worse than normal. It comes and goes. Aware of referral. Army Fossa CMA  December 16, 2010 8:20 AM

## 2010-12-29 NOTE — Progress Notes (Signed)
Summary: referral  Phone Note Call from Patient Call back at Home Phone [phone]   Summary of Call: Patient spouse would like to know about patient referral. I made her aware that Luster Landsberg is working on this and will call her tomorrow. Initial call taken by: Lucious Groves CMA,  December 20, 2010 4:32 PM  Follow-up for Phone Call        pt appt 12-27-2010 w/ccs, i s/w spouse she is aware Magdalen Spatz [facility]  December 21, 2010 8:28 AM

## 2011-03-07 ENCOUNTER — Other Ambulatory Visit: Payer: Self-pay | Admitting: Internal Medicine

## 2011-03-07 NOTE — Op Note (Signed)
NAME:  [name] [name], [name] [name] NO.:  [id number]   MEDICAL RECORD NO.:  [id number]          PATIENT TYPE:  INP   LOCATION:  0006                         FACILITY:  [facility]   PHYSICIAN:  Madlyn Frankel. Charlann Boxer, M.D.  DATE OF BIRTH:  [birth year]   DATE OF PROCEDURE:  03/24/2008  DATE OF DISCHARGE:                               OPERATIVE REPORT   PREOPERATIVE DIAGNOSIS:  Right knee osteoarthritis.   POSTOPERATIVE DIAGNOSIS:  Right knee osteoarthritis.   PROCEDURE:  Right total knee replacement.   COMPONENTS USED:  DePuy rotating platform posterior stabilized knee  system with size 5 femur, 4 tibia, 10-mm insert and a 41 patellar  button.   SURGEON:  Madlyn Frankel. Charlann Boxer, M.D.   ASSISTANT:  Yetta Glassman. Mann, PA.   ANESTHESIA:  Duramorph spinal.   DRAINS:  x1.   COMPLICATIONS:  None.   TOURNIQUET TIME:  39 minutes at 250 mmHg.   INDICATION FOR PROCEDURE:  Mr. Delap is a 70 year old patient of mine  with bilateral total hip replacements, who presented with increasing  right knee discomfort, failing conservative measures.  Despite attempts  at conservative measures and his desire not to have another  arthroplasty, his symptoms have progressed to the point that he wished  to go and proceed with it.  We reviewed the risks and benefits and the  comparison of knee replacement versus total hip replacement.  Consent  was obtained.   PROCEDURE IN DETAIL:  The patient was brought to the operative theater.  Once adequate anesthesia and preoperative antibiotics of Ancef  administered, the patient was positioned supine with a proximal thigh  tourniquet in place.  The right lower extremity was then prescrubbed and  prepped and draped in sterile fashion.  The leg was exsanguinated,  tourniquet elevated.  A midline incision was made followed by a median  arthrotomy.  Patella subluxation was carried out.  Following initial  debridement attention was first directed to the patella.  Precut  measurement was 25 mm.  I resected down to about 14 mm, debriding the  infrapatellar fat pad in addition to the synovium on the proximal  aspect.   The 41 patella appeared to fit nicely on the cut surface.  The lug holes  drills were done.  At this point I placed a metal shim and subluxed the  patella to the side as the knee was flexed.  Attention was now directed  to the femur.  Following initiating the canal opening, I irrigated the  canal to prevent fat emboli.  Intramedullary rod then passed down the  femur.  Based on the preoperative flexion contracture and the size of  the knee I resected 12 mm of bone off the distal femur.  Following this  resection I sized the femur to be a size 5.  The posterior condylar axis  was perpendicular to the [facility] line in the AP axis.   I went ahead and made the anterior-posterior and chamfering cuts without  complication, nor was there any notching.   Based on the lateral aspect of distal femur, I went ahead and  made my  box cut.   At this point the attention was now directed to the tibia.  The tibia  was subluxated anterior and remaining meniscus was removed in addition  to the stumps of the PCL.  the tibia was subluxated forward.  I used an  extramedullary rod and perpendicular to the shaft of the tibia made a  cut off the proximal tibia of 10 mm.  I sized the cut surface.  It fit  best with a size 4.  This allowed me to rotate the tibia to the medial  third of the tibial tubercle.  I pinned it in position, checked with the  alignment rod and was happy that it was a perpendicular cut.  At this  point I drilled and keel-punched the tibia and did a trial reduction.  Please note that after my proximal tibia cut I did check an extension  block and found the knee came out to full extension.  At this point  trial reduction was carried out with this 5 femur, 4 tibia and a 10-mm  insert.  The knee came out to full extension and was stable from   extension to flexion.  A 41 patellar button tracked without application  of pressure.  At this point all trial components were removed.  The knee  was irrigated with normal saline solution.  I then injected the synovial  capsule junction with 0.25% Marcaine with epinephrine and 1 mL of  Toradol.  The knee was irrigated, cement mixed, final components opened.  Final components were then cemented into position and the knee was  brought to extension with a 10-mm insert in place.  The extruded cement  was removed.  Once the cement had cured, the knee was brought to flexion  and the remaining cement removed.  The final 10-mm insert was then  placed into the tibial tray.  We reirrigated the knee, placed a medium  Hemovac drain deep.  The tourniquet was let down at 39 minutes.  At this  point the extensor mechanism was reapproximated using #1 Vicryl with the  knee in flexion.  The remainder of the wound was closed with 2-0 Vicryl  and running 4-0 Monocryl.  The knee was then dressed in Steri-Strips and  a sterile bulky Jones dressing.  He was brought to recovery room and  tolerated the procedure very well.      Madlyn Frankel Charlann Boxer, M.D.  Electronically Signed     MDO/MEDQ  D:  03/24/2008  T:  03/24/2008  Job:  [id number]

## 2011-03-07 NOTE — H&P (Signed)
NAME:  [name] [name], [name] [name] NO.:  [id number]   MEDICAL RECORD NO.:  [id number]         PATIENT TYPE:  LINP   LOCATION:                               FACILITY:  [facility]   PHYSICIAN:  Madlyn Frankel. Charlann Boxer, M.D.  DATE OF BIRTH:  03-Oct-1941   DATE OF ADMISSION:  03/24/2008  DATE OF DISCHARGE:                              HISTORY & PHYSICAL   PROCEDURE:  Right total knee arthroplasty.   CHIEF COMPLAINTS:  Right knee pain.   HISTORY OF PRESENT ILLNESS:  This is a 70 year old male with a history  of right knee pain secondary to osteoarthritis.  It has been refractory  to all conservative treatment including oral anti-inflammatories and  knee scope, cortisone injection and viscous supplementation.  He has  diminished quality of life, pain at night.  He has been presurgically  assessed for a right total knee replacement by his primary care  physician, Dr. Drue Novel.   PAST MEDICAL HISTORY:  Significant for:  1. Osteoarthritis.  2. Dyslipidemia.  3. Borderline hypertension.   PAST SURGICAL HISTORY:  Hernia operations x3.   FAMILY HISTORY:  Diabetes.   SOCIAL HISTORY:  Married, Chartered certified accountant.  Primary caregiver after surgery  will be his wife, Diane at home.   DRUG ALLERGIES:  NO KNOWN DRUG ALLERGIES.   MEDICATIONS:  1. Simvastatin 80 mg one p.o. daily.  2. Meloxicam 7.5 mg p.o. daily.  3. Arthritis Tylenol 650 mg one p.o. daily.  4. Metoprolol 25 mg one p.o. b.i.d. x3 days before surgery and 3 days      after surgery.  5. Celebrex 200 mg one p.o. b.i.d. x2 weeks after surgery.   REVIEW OF SYSTEMS:  None other than HPI.   PHYSICAL EXAMINATION:  VITAL SIGNS:  Pulse 64, respirations 18, blood  pressure 146/76.  GENERAL:  Awake, alert and oriented, well-developed, well-nourished, no  acute distress.  NECK: Supple.  No carotid bruits.  CHEST/LUNGS:  Clear to auscultation bilaterally.  BREASTS:  Deferred.  HEART:  Regular rate and rhythm.  S1-S2 distinct.  ABDOMEN:  Soft,  nontender, nondistended.  Bowel sounds present.  GENITOURINARY:  Deferred.  EXTREMITIES:  He does have dorsalis pedis pulse positive.  He has a very  mild flexion contracture maybe 2-3 degrees of this right lower  extremity.  He flexes his back to beyond 110 degrees.  SKIN:  No cellulitis.  NEUROLOGIC:  Intact distal sensibilities.   LABS:  EKG, chest x-ray all pending presurgical testing.   IMPRESSION:  Right knee osteoarthritis.   PLAN OF ACTION:  Right total knee arthroplasty [facility] March 24, 2008 by surgeon Dr. Durene Romans.  Risks and complications were  discussed.  Questions were encouraged, answered and reviewed.   Postoperative medications including Lovenox, Robaxin, iron, aspirin,  Colace, MiraLax provided at time of history and physical.  Pain  medicines will be provided at time of surgery.     ______________________________  Yetta Glassman [address], [place]      Madlyn Frankel. Charlann Boxer, M.D.  Electronically Signed    BLM/MEDQ  D:  03/18/2008  T:  03/18/2008  Job:  [id number]   cc:   Willow Ora, MD  [phone] [address] [place], [place] [address]

## 2011-03-10 NOTE — Op Note (Signed)
NAME:  QUSAY, VILLADA NO.:  [id number]   MEDICAL RECORD NO.:  [id number]          PATIENT TYPE:  AMB   LOCATION:  DAY                          FACILITY:  [facility]   PHYSICIAN:  Alfonse Ras, MD   DATE OF BIRTH:  [birth year]   DATE OF PROCEDURE:  07/27/2006  DATE OF DISCHARGE:                                 OPERATIVE REPORT   PREOPERATIVE DIAGNOSIS:  Possible ventral hernia.   POSTOPERATIVE DIAGNOSIS:  Rectal  diastasis.   PROCEDURE:  Diagnostic laparoscopy.   SURGEON:  Alfonse Ras, MD   ASSISTANT:  None.   ANESTHESIA:  General.   DESCRIPTION:  The patient was taken to the operating room and placed in  supine position.  After adequate general anesthesia was induced using  endotracheal tube, the abdomen was prepped and draped in the normal sterile  fashion.  Using a OptiView in the left upper quadrant, a pneumoperitoneum  was obtained under direct vision.  On total inspection of the abdomen, I saw  no evidence of a ventral hernia.  He did have a weakness of the upper  midline, but there was no rim and no hernia.  Finding no hernia, I felt no  need to place any mesh intra-abdominally, and therefore the pneumoperitoneum  was released and the skin incision was closed with 4-0 Monocryl.  The  patient tolerated the procedure well and went to PACU in good condition.      Alfonse Ras, MD  Electronically Signed     KRE/MEDQ  D:  07/27/2006  T:  07/28/2006  Job:  [id number]

## 2011-03-10 NOTE — Procedures (Signed)
[place]  Patient:    [name] [name], [name] [name] Visit Number: [id number] MRN: [id number]          Service Type: END Location: ENDO Attending Physician:  Rich Brave Dictated by:   Florencia Reasons, M.D. Proc. Date: 08/23/01 Admit Date:  08/23/2001 Discharge Date: 08/23/2001   CC:         Leanne Chang, M.D.                           Procedure Report  PROCEDURE:  Colonoscopy with polypectomy.  INDICATIONS FOR PROCEDURE:  This is a 70 year old who underwent a screening flexible sigmoidoscopy today and was found to have several polyps.  FINDINGS:  Four small to medium sized polyps removed by snare technique.  DESCRIPTION OF PROCEDURE:  The nature, purpose, and risks of the procedure had been discussed with the patient in the office this morning following his flexible sigmoidoscopy, for which he had undergone a colite prep.  He consented to the procedure, and presented to the [facility]. Sedation was Demerol 50 mg and Versed 5 mg IV without arrhythmias or desaturation.  Digital examination of the prostate gland was normal.  The Olympus adult video colonoscope was quite easily advanced to the level of the ileocecal valve and, then with some abdominal compression, the tip of the scope passed into the base of the cecum and pull back was initiated.  The quality of the prep was fairly good, although there were some patches of mushy stool residue here and there.  It is not felt they would have obscured any major lesions, although some small lesions could conceivably have been missed.  Due to the presence of vegetable matter and the character of the stool, it was not really possible to cleanse the entire colon well during the exam.  There were four polyps observed on this exam, ranging in size from roughly 3 mm to 6 mm, all semi-pedunculated in character, each removed by snare technique with complete hemostasis and no excessive  cautery.  These were located at approximately 40, 35, 20, and 18 cm from the external anal opening. Each polyp was retrieved by suctioning through the scope.  No large polyps, cancer, colitis, or diverticular disease were observed. Retroflexion was not performed in the rectum due to the proximity of the distal polypectomy.  Antegrade viewing, however, disclosed no additional pathology.  There were some moderate internal hemorrhoids.  The patient tolerated this procedure well, and there were no apparent complications.  IMPRESSION:  Four small colon polyps removed as described above.  PLAN:  Await pathology.  Anticipate probable colonoscopic followup in 3 years if any of the polyps are adenomatous in character. Dictated by:   Florencia Reasons, M.D. Attending Physician:  Rich Brave DD:  08/23/01 TD:  08/26/01 Job: [id number]

## 2011-03-10 NOTE — Op Note (Signed)
NAME:  [name] [name], [name] [name] NO.:  [id number]   MEDICAL RECORD NO.:  [id number]          PATIENT TYPE:  AMB   LOCATION:  ENDO                         FACILITY:  [facility]   PHYSICIAN:  Bernette Redbird, M.D.   DATE OF BIRTH:  [birth year]   DATE OF PROCEDURE:  12/05/2004  DATE OF DISCHARGE:                                 OPERATIVE REPORT   PROCEDURE:  Colonoscopy with polypectomy, biopsy and directed submucosal  injection.   ENDOSCOPIST:  Bernette Redbird, M.D.   INDICATIONS FOR PROCEDURE:  The patient is a 70 year old gentleman about 3-  1/2 years status post removal of several colon polyps (initially detected on  screening flexible sigmoidoscopy), one of which was adenomatous in  Editor, commissioning.   FINDINGS:  Several polyps removed.   INFORMED CONSENT:  The nature, purpose and risks of the procedure were  familiar to the patient from prior examination.  He provided a written  consent.   SEDATION:  Fentanyl 75 mcg, Versed 7.5 mg IV, without arrhythmias or  desaturation.  A digital examination of the prostate was unremarkable.   DESCRIPTION OF PROCEDURE:  The Olympus adjustable tension adult video  colonoscope was used for this examination, and despite the use of that  scope, there was still some looping and difficulty entering the base of the  cecum, but after being situation above the cecum for awhile, and turning the  patient from the supine position back to the left lateral decubitus  position, the scope finally crept forward into the base of the cecum, as  identified by visualization of the appendiceal orifice, and the controlled  look of the entire cecum was achieved.  Pullback was then performed.  The  quality of the prep was very good, and it was felt that all areas were well-  seen.   There was some mild right-sided diverticulosis.  Four polyps were identified  and removed on this exam.  Two were cold snared (one in the sigmoid at about  30 cm as measured during  pullback, the other in the proximal colon).  There  was also a smaller polyp in the transverse colon, removed by cold biopsy  technique.  Finally, there was a somewhat larger roughly 15 mm sessile or  semi-pedunculated, slightly erythematous polyp on a fold in the region of  the splenic flexure.  I injected the base of this polyp with 1:10,000  epinephrine to elevate it, with good bleb formation and then I snared it off  with cautery.  The polyp was retrieved by suctioning through the scope,  during which time it fragmented.  There was no evidence of bleeding, or any  evidence of excessive cautery at the polypectomy site.   Retroflexion in the rectum and re-inspection of the rectum were  unremarkable.  No cancer, colitis, or vascular malformations were observed.   The patient tolerated the procedure well, and there were no apparent  complications.   IMPRESSION:  1.  Colon polyps removed as described above - code #211.3.  2.  Mild diverticulosis.   PLAN:  Await pathology results.  RB/MEDQ  D:  12/05/2004  T:  12/05/2004  Job:  [id number]   cc:   Leanne Chang, M.D.  [address]  [place]  [place] [address]  Fax: [phone]

## 2011-03-10 NOTE — Assessment & Plan Note (Signed)
[facility]                        [name]   [name], [name]                        MRN:          [id number]  DATE:11/16/2006                            DOB:          [birth year]    REASON FOR VISIT:  Followup.  [name] is a 70 year old male with a  history of hypertriglyceridemia, hyperlipidemia, and gastroesophageal  reflux disease.  He presents for followup.  In November he was started  on Zocor secondary to his LDL cholesterol being 164.  He reports no side  effects from the medication.  He previously was also on Niaspan, but  discontinued the medication earlier in the month after being started on  a pain medication by his orthopedist for knee pain. He states that he  could not tolerate all 3 medicines at once.   He reports that his diet was fully balanced until the holidays when he  started to eat a lot of ham.  He initially lost 10 pounds but has  regained it.   PAST MEDICAL HISTORY:  1. Hyperlipidemia.  2. Erectile dysfunction.  3. Adenomatous colon polyps.  4. Diverticulosis.  5. Gastroesophageal reflux disease.   MEDICATIONS:  1. Aspirin 1 daily.  2. Fish oil capsule.  3. Niaspan 1000 mg nightly, but discontinued earlier in January.  4. Zocor 40 mg daily.  5. Prevacid 1 daily p.r.n.   ALLERGIES:  NO KNOWN DRUG ALLERGIES.   OBJECTIVE:  Weight 255, pulse 78, blood pressure 135/84.  We have a pleasant male in no acute distress, overweight.  NECK:  Supple, no lymphadenopathy, carotid bruits, or JVD.  LUNGS:  Clear.  HEART:  Regular rate and rhythm, normal S1, S2, no murmurs, gallops, or  rubs.  EXTREMITIES:  No cyanosis, clubbing, or edema.   IMPRESSION:  1. Hyperlipidemia with recent start on Zocor 40 mg secondary to an LDL      of 164.  Tolerating medication well.  2. Gastroesophageal reflux, stable.  3. History of colon polyp with last colonoscopy done in 2006.      Recommendation for patient with  surveillance every 2-3 years.   PLAN:  1. Advised patient that I would like him to restart the Niaspan as      soon as possible.  He prefers to complete the course with the anti      inflammatory that was prescribed by orthopedics.  He anticipates      possible surgery in the near future if the MRI is conclusive      regarding the etiology of his pain.  2. I will schedule the patient for a repeat lipid profile, AST and      ALT.  Additionally a serum glucose will be drawn given a slight      elevation at the previous lab visit.  3. Patient to follow up as needed in the interim, otherwise, will      schedule a complete physical examination in August.  4. Will refer patient back to Dr. Matthias Hughs for his colonoscopy      followup.  Patient expresses understanding.  Leanne Chang, M.D.  Electronically Signed    LA/MedQ  DD: 11/16/2006  DT: 11/16/2006  Job #: [id number]

## 2011-03-10 NOTE — Discharge Summary (Signed)
NAME:  [name] [name], [name] [name] NO.:  [id number]   MEDICAL RECORD NO.:  [id number]          PATIENT TYPE:  INP   LOCATION:  1609                         FACILITY:  [facility]   PHYSICIAN:  Madlyn Frankel. Charlann Boxer, M.D.  DATE OF BIRTH:  [birth year]   DATE OF ADMISSION:  03/24/2008  DATE OF DISCHARGE:  03/26/2008                               DISCHARGE SUMMARY   ADMISSION DIAGNOSES:  1. Osteoarthritis.  2. Dyslipidemia.  3. Borderline hypertension.   DISCHARGE DIAGNOSES:  1. Osteoarthritis.  2. Dyslipidemia.  3. Borderline hypertension.   HISTORY OF PRESENT ILLNESS:  A 70 year old male with a history of right  knee pain secondary to osteoarthritis.  He is refractory to all  conservative treatment including oral anti-inflammatories, knee scope,  cortisone injections and Viscosupplementation.   CONSULTATION:  None.   PROCEDURE:  Right total knee arthroplasty by surgeon Dr. Durene Romans.  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   LABORATORY DATA:  Preadmission CBC:  Hemoglobin/hematocrit respectively  were 14 and 40.2, platelets 231. At time of discharge:  Hemoglobin 12,  hematocrit 34.5, platelets 192.  White cell differential normal.  Coags  within normal limits.  Routine chemistry on admission:  Sodium 138,  potassium 3.6, glucose 141, creatinine 1.01.  At time of discharge:  Sodium 137, potassium 4.5, glucose 164, creatinine 0.97.  Kidney  function:  GFR greater than 16, calcium 8.2 at discharge.  UA negative.   DIAGNOSTICS:  Chest two-view:  No acute cardiopulmonary process.  Indeterminate compression fracture of T8.   Cardiology had previous cardiology workup for previous surgery.   HOSPITAL COURSE:  The patient underwent a right total knee replacement  and admitted to the orthopedic floor.  His stay was unremarkable.  He  remained afebrile and hemodynamically stable.  His dressing was changed.  Wound looked great.  Neurovascularly intact to his right lower  extremity.  He had  increasing quad function throughout.  He made  excellent progress with PT/OT.  Lovenox was started on postop day number  1.  Pain was well controlled.   DISCHARGE DISPOSITION:  Discharged home, home health care PT, stable and  improved condition.  Discharged with physical therapy.  Weightbearing as  tolerated with use of a rolling walker.  Goals of physical therapy will  be 0-100 and 0-120 at 6 weeks.   DISCHARGE DIET:  Regular.   DISCHARGE WOUND CARE:  Keep dry.   DISCHARGE FOLLOWUP:  Follow up with Dr. Charlann Boxer at phone number [phone] in  2 weeks.   DISCHARGE MEDICATIONS:  1. Lovenox 40 mg subcu. q.24 h. x 11 days.  2. Robaxin 5 mg p.o. q.6 h. p.r.n. muscle spasm and pain.  3. Enteric-coated aspirin 325 mg p.o. daily x4 weeks after Lovenox      completed.  4. Iron 325 mg p.o. t.i.d. x2 weeks.  5. Colace 100 mg p.o. b.i.d.  6. MiraLax 17 gm p.o. daily.  7. Vicodin 5/325 1-2 p.o. q.4-6 h. p.r.n. pain.  8. Prednisone 5 mg, 12 day Dosepak.  9. Simvastatin 80 mg 1 p.o. daily.  10.Metoprolol 25 mg 1 p.o. b.i.d.  x3 days after surgery.  11.Celebrex 200 mg p.o. b.i.d. x2 weeks.     ______________________________  Yetta Glassman [address], [place]      Madlyn Frankel. Charlann Boxer, M.D.  Electronically Signed    BLM/MEDQ  D:  04/28/2008  T:  04/28/2008  Job:  [id number]   cc:   Willow Ora, MD  [phone] [address] [place], [place] [address]

## 2011-03-21 ENCOUNTER — Other Ambulatory Visit: Payer: Self-pay | Admitting: Internal Medicine

## 2011-06-01 ENCOUNTER — Other Ambulatory Visit: Payer: Self-pay | Admitting: Emergency Medicine

## 2011-06-01 DIAGNOSIS — M25512 Pain in left shoulder: Secondary | ICD-10-CM

## 2011-06-04 ENCOUNTER — Ambulatory Visit
Admission: RE | Admit: 2011-06-04 | Discharge: 2011-06-04 | Disposition: A | Discharge status: Home / Self Care | Payer: Medicare Other | Source: Ambulatory Visit | Attending: Emergency Medicine | Admitting: Emergency Medicine

## 2011-06-04 DIAGNOSIS — M25512 Pain in left shoulder: Secondary | ICD-10-CM

## 2011-06-07 ENCOUNTER — Ambulatory Visit (INDEPENDENT_AMBULATORY_CARE_PROVIDER_SITE_OTHER): Payer: Medicare Other | Admitting: Internal Medicine

## 2011-06-07 ENCOUNTER — Encounter: Payer: Self-pay | Admitting: Internal Medicine

## 2011-06-07 DIAGNOSIS — I10 Essential (primary) hypertension: Secondary | ICD-10-CM

## 2011-06-07 DIAGNOSIS — S43409A Unspecified sprain of unspecified shoulder joint, initial encounter: Secondary | ICD-10-CM

## 2011-06-07 DIAGNOSIS — E785 Hyperlipidemia, unspecified: Secondary | ICD-10-CM

## 2011-06-07 MED ORDER — ZOSTER VACCINE LIVE 19400 UNT/0.65ML ~~LOC~~ SOLR: 0.6500 mL | Freq: Once | SUBCUTANEOUS | Status: DC

## 2011-06-07 NOTE — Assessment & Plan Note (Signed)
Recent fall, evaluated elsewhere, MRI pending rec tylenol for pain ,avoid NSAIDs if possible

## 2011-06-07 NOTE — Assessment & Plan Note (Signed)
Well-controlled, no change 

## 2011-06-07 NOTE — Assessment & Plan Note (Signed)
At goal.  

## 2011-06-07 NOTE — Progress Notes (Signed)
  Subjective:    Patient ID: [name] [name], male    DOB: 1941-04-02, 70 y.o.   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 He appears well-developed. No distress.       Overweight appearing   Cardiovascular: Normal rate, regular rhythm and normal heart sounds.   No murmur heard. Pulmonary/Chest: Effort normal and breath sounds normal. No respiratory distress. He has no wheezes. He has no rales.  Musculoskeletal: Edema: trace B edema   Neurological: He is alert and oriented to person, place, and time.  Skin: He is not diaphoretic.  Psychiatric: He has a normal mood and affect. His behavior is normal. Judgment and thought content normal.          Assessment & Plan:  Request Shingles shot: Rx provided

## 2011-07-19 LAB — DIFFERENTIAL
Lymphs Abs: 1.7
Monocytes Absolute: 0.5
Monocytes Relative: 8
Neutro Abs: 3.7
Neutrophils Relative %: 60

## 2011-07-19 LAB — URINALYSIS, ROUTINE W REFLEX MICROSCOPIC
Glucose, UA: NEGATIVE
Hgb urine dipstick: NEGATIVE
Protein, ur: NEGATIVE
Specific Gravity, Urine: 1.018
pH: 5.5

## 2011-07-19 LAB — CBC
Hemoglobin: 14
MCV: 88.4
RBC: 4.55
WBC: 6.1

## 2011-07-19 LAB — BASIC METABOLIC PANEL
Chloride: 99
Creatinine, Ser: 1.01
GFR calc Af Amer: >60
Sodium: 138

## 2011-07-19 LAB — PROTIME-INR: INR: 0.9

## 2011-07-20 LAB — CBC
Hemoglobin: 13
MCHC: 34.8
MCHC: 34.9
MCV: 88.9
RBC: 4.19 — ABNORMAL LOW
RDW: 12.4

## 2011-07-20 LAB — BASIC METABOLIC PANEL
CO2: 26
CO2: 29
Calcium: 8.2 — ABNORMAL LOW
Chloride: 102
Creatinine, Ser: 0.97
GFR calc Af Amer: >60
Glucose, Bld: 164 — ABNORMAL HIGH
Sodium: 137

## 2011-07-20 LAB — TYPE AND SCREEN: ABO/RH(D): O POS

## 2011-12-07 ENCOUNTER — Encounter: Payer: Self-pay | Admitting: Internal Medicine

## 2011-12-07 ENCOUNTER — Ambulatory Visit (INDEPENDENT_AMBULATORY_CARE_PROVIDER_SITE_OTHER): Payer: Medicare Other | Admitting: Internal Medicine

## 2011-12-07 VITALS — BP 150/80 | HR 56 | Temp 98.2°F | Ht 71.5 in | Wt 273.0 lb

## 2011-12-07 DIAGNOSIS — Z125 Encounter for screening for malignant neoplasm of prostate: Secondary | ICD-10-CM

## 2011-12-07 DIAGNOSIS — Z0000 Encounter for general adult medical examination without abnormal findings: Secondary | ICD-10-CM

## 2011-12-07 DIAGNOSIS — R609 Edema, unspecified: Secondary | ICD-10-CM

## 2011-12-07 DIAGNOSIS — I10 Essential (primary) hypertension: Secondary | ICD-10-CM

## 2011-12-07 DIAGNOSIS — E785 Hyperlipidemia, unspecified: Secondary | ICD-10-CM

## 2011-12-07 LAB — LIPID PANEL
Total CHOL/HDL Ratio: 4
Triglycerides: 240 mg/dL — ABNORMAL HIGH (ref 0.0–149.0)
VLDL: 48 mg/dL — ABNORMAL HIGH (ref 0.0–40.0)

## 2011-12-07 LAB — PSA: PSA: 1.75 ng/mL (ref 0.10–4.00)

## 2011-12-07 LAB — COMPREHENSIVE METABOLIC PANEL
AST: 18 U/L (ref 0–37)
Albumin: 4.2 g/dL (ref 3.5–5.2)
Alkaline Phosphatase: 77 U/L (ref 39–117)
BUN: 14 mg/dL (ref 6–23)
Potassium: 4.7 mEq/L (ref 3.5–5.1)
Sodium: 140 mEq/L (ref 135–145)
Total Protein: 6.8 g/dL (ref 6.0–8.3)

## 2011-12-07 MED ORDER — ZOSTER VACCINE LIVE 19400 UNT/0.65ML ~~LOC~~ SOLR: 0.6500 mL | Freq: Once | SUBCUTANEOUS | Status: DC

## 2011-12-07 NOTE — Patient Instructions (Signed)
You are due  for a colonoscopy, please call your GI doctor

## 2011-12-07 NOTE — Assessment & Plan Note (Addendum)
Right calf found to be larger on today's exam, the patient was unaware of. Calf is nontender, doubt an acute problem but will get ultrasound to document. Of note, he had a right knee replacement in the past.

## 2011-12-07 NOTE — Progress Notes (Signed)
  Subjective:    Patient ID: [name] [name], male    DOB: [birth year], 71 y.o.   MRN: [id number]  HPI Here for Medicare AWV: 1. Risk factors based on Past M, S, F history: reviewed  2. Physical Activities: bike x 20 min 3 times a week 3. Depression/mood: no problems noted or reported  4. Hearing: no problems noted or reported  5. ADL's: independent  6. Fall Risk: had a fall in the summer , had a shoulder injury, recovered well, was a mechanical             fall while feeding his chicken---> prevention discussed  7. Home Safety: does feel safe at home  8. Height, weight, &visual acuity: see VS, vision no problems noted or reported  9. Counseling: yes  10. Labs ordered based on risk factors: yes   11.           Referral Coordination, if needed  12.           Care Plan, see a/p 13.            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 No difficulty urinating or gross hematuria.     Objective:   Physical Exam  Constitutional: He is oriented to person, place, and time. He appears well-developed. No  distress.       overweight  HENT:  Head: Normocephalic and atraumatic.  Neck: No thyromegaly present.  Cardiovascular: Normal rate, regular rhythm and normal heart sounds.   No murmur heard. Pulmonary/Chest: Effort normal and breath sounds normal. No respiratory distress. He has no wheezes. He has no rales.  Abdominal: Soft. Bowel sounds are normal. He exhibits no distension. There is no tenderness. There is no rebound and no guarding.  Genitourinary: Prostate normal.       No stools found   Musculoskeletal:       R calf ~ 1.5 cm larger than L, no tender . No actual pitting edema  Neurological: He is alert and oriented to person, place, and time.  Skin: Skin is warm and dry. He is not diaphoretic.  Psychiatric: He has a normal mood and affect. His behavior is normal. Judgment and thought content normal.      Assessment & Plan:

## 2011-12-07 NOTE — Assessment & Plan Note (Addendum)
Td 09 pneumonia shot--2010 Had a  zostavax 2012 per pt   labs      Colonoscopy:  Adenomatous Polyp (12/17/2006)-- next 5 years , rec to call GI   encouraged to continue exercise  recommend weight loss, diet discussed

## 2011-12-07 NOTE — Assessment & Plan Note (Signed)
BP slightly elevated today but usually okay, good ambulatory BPs. No change

## 2011-12-07 NOTE — Assessment & Plan Note (Signed)
Good medication compliance, due for lab

## 2011-12-08 ENCOUNTER — Other Ambulatory Visit: Payer: Self-pay | Admitting: Cardiology

## 2011-12-08 DIAGNOSIS — R609 Edema, unspecified: Secondary | ICD-10-CM

## 2011-12-10 ENCOUNTER — Encounter: Payer: Self-pay | Admitting: Internal Medicine

## 2011-12-11 ENCOUNTER — Encounter (INDEPENDENT_AMBULATORY_CARE_PROVIDER_SITE_OTHER): Payer: Medicare Other | Admitting: Cardiology

## 2011-12-11 DIAGNOSIS — M7989 Other specified soft tissue disorders: Secondary | ICD-10-CM

## 2011-12-11 DIAGNOSIS — R609 Edema, unspecified: Secondary | ICD-10-CM

## 2012-01-03 ENCOUNTER — Other Ambulatory Visit: Payer: Self-pay | Admitting: Internal Medicine

## 2012-01-03 NOTE — Telephone Encounter (Signed)
Refill done.  

## 2012-01-24 ENCOUNTER — Telehealth: Payer: Self-pay | Admitting: Internal Medicine

## 2012-01-24 MED ORDER — LOSARTAN POTASSIUM 50 MG PO TABS: 50.0000 mg | ORAL_TABLET | Freq: Every day | ORAL | Status: DC

## 2012-01-24 MED ORDER — SIMVASTATIN 80 MG PO TABS: 80.0000 mg | ORAL_TABLET | Freq: Every day | ORAL | Status: DC

## 2012-01-24 NOTE — Telephone Encounter (Signed)
Refill: Losartan potassium 50 mg tab. 90 day supply  Simvastatin 80 mg tab. 90 day supply.

## 2012-01-24 NOTE — Telephone Encounter (Signed)
Refill done.  

## 2012-01-30 ENCOUNTER — Other Ambulatory Visit: Payer: Self-pay | Admitting: Gastroenterology

## 2012-02-14 ENCOUNTER — Encounter: Payer: Self-pay | Admitting: Internal Medicine

## 2012-05-13 ENCOUNTER — Encounter: Payer: Self-pay | Admitting: Internal Medicine

## 2012-05-13 ENCOUNTER — Ambulatory Visit (INDEPENDENT_AMBULATORY_CARE_PROVIDER_SITE_OTHER): Payer: Medicare Other | Admitting: Internal Medicine

## 2012-05-13 VITALS — BP 148/86 | HR 67 | Temp 98.1°F | Wt 276.0 lb

## 2012-05-13 DIAGNOSIS — I10 Essential (primary) hypertension: Secondary | ICD-10-CM

## 2012-05-13 DIAGNOSIS — E785 Hyperlipidemia, unspecified: Secondary | ICD-10-CM

## 2012-05-13 DIAGNOSIS — Z01818 Encounter for other preprocedural examination: Secondary | ICD-10-CM | POA: Insufficient documentation

## 2012-05-13 DIAGNOSIS — M25569 Pain in unspecified knee: Secondary | ICD-10-CM

## 2012-05-13 LAB — CBC WITH DIFFERENTIAL/PLATELET
Basophils Absolute: 0.1 10*3/uL (ref 0.0–0.1)
Eosinophils Relative: 2.9 % (ref 0.0–5.0)
HCT: 40.5 % (ref 39.0–52.0)
Lymphocytes Relative: 24.7 % (ref 12.0–46.0)
Monocytes Relative: 11.6 % (ref 3.0–12.0)
Neutrophils Relative %: 59.9 % (ref 43.0–77.0)
Platelets: 221 10*3/uL (ref 150.0–400.0)
WBC: 6.7 10*3/uL (ref 4.5–10.5)

## 2012-05-13 LAB — BASIC METABOLIC PANEL
BUN: 14 mg/dL (ref 6–23)
Calcium: 9 mg/dL (ref 8.4–10.5)
GFR: 74.25 mL/min (ref >60.00)
Potassium: 4 mEq/L (ref 3.5–5.1)

## 2012-05-13 MED ORDER — CARVEDILOL 6.25 MG PO TABS: 6.2500 mg | ORAL_TABLET | Freq: Two times a day (BID) | ORAL | Status: DC

## 2012-05-13 NOTE — Assessment & Plan Note (Addendum)
71 year old gentleman in need of a left knee arthroscopy for a meniscal repair. BP has been slightly elevated last 2 times he was here. Will adjust his medication, see hypertension He is asymptomatic from a cardiopulmonary standpoint. EKG today, sinus bradycardia with a rate of 59. No abnormalities, no change from previous EKGs. We'll clear him, labs pending  Recommend early ambulation to prevent clots. His surgeon also recommended to bump up his aspirin dose to 325 daily temporarily. Recommend to take aspirin with food.

## 2012-05-13 NOTE — Assessment & Plan Note (Addendum)
BP slightly elevated at the last 2 office visits. Will start a low dose of Coreg. See instructions

## 2012-05-13 NOTE — Assessment & Plan Note (Signed)
Cleared today to have an arthroscopy

## 2012-05-13 NOTE — Progress Notes (Signed)
  Subjective:    Patient ID: [name] [name], male    DOB: 06-21-1941, 71 y.o.   MRN: [id number]  HPI Here for surgical clearance. Dr. Juliene Pina plans to do a left arthroscopy for meniscus tear. Hypertension, good medication compliance. High cholesterol good medication compliance, no apparent side effects.  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 No orthopnea. No lower extremity edema. Before he developed the knee pain he was able to walk 1 mile every morning and 1 mild every afternoon without problems. Able to go up the stairs without issues. No dysuria or gross hematuria No cough or wheezing. In the 90s, was seen by Dr. Clarene Duke, cardiology, for palpitations, he was never diagnosed with atrial fibrillation to his knowledge. Asymptomatic since.     Objective:   Physical Exam  General -- alert, well-developed, and overweight appearing. No apparent distress.  Neck --no JVD at 45 Lungs -- normal respiratory effort, no intercostal retractions, no accessory muscle use, and normal breath sounds.   Heart-- normal rate, regular rhythm, no murmur, and no gallop.   Extremities-- no pretibial edema bilaterally  Neurologic-- alert & oriented X3 and strength normal in all extremities. Psych-- Cognition and judgment appear intact. Alert and cooperative with normal attention span and concentration.  not anxious appearing and not depressed appearing.         Assessment & Plan:

## 2012-05-13 NOTE — Assessment & Plan Note (Signed)
Well-controlled, no change 

## 2012-05-13 NOTE — Patient Instructions (Signed)
Check the  blood pressure 2 or 3 times a week, be sure it is between 110/60 and 135/85. If it is consistently higher or lower, let me know. Call my nurse in one week, asked for a BP and pulse check. No charge.

## 2012-05-14 ENCOUNTER — Encounter: Payer: Self-pay | Admitting: *Deleted

## 2012-06-05 ENCOUNTER — Ambulatory Visit: Payer: Medicare Other | Admitting: Internal Medicine

## 2012-06-17 ENCOUNTER — Ambulatory Visit: Payer: Medicare Other | Admitting: Internal Medicine

## 2012-09-02 ENCOUNTER — Encounter (HOSPITAL_COMMUNITY): Payer: Self-pay

## 2012-09-04 ENCOUNTER — Encounter (HOSPITAL_COMMUNITY): Payer: Self-pay

## 2012-09-04 ENCOUNTER — Encounter (HOSPITAL_COMMUNITY)
Admission: RE | Admit: 2012-09-04 | Discharge: 2012-09-04 | Disposition: A | Discharge status: Home / Self Care | Payer: Medicare Other | Source: Ambulatory Visit | Attending: Orthopedic Surgery | Admitting: Orthopedic Surgery

## 2012-09-04 ENCOUNTER — Ambulatory Visit (HOSPITAL_COMMUNITY)
Admission: RE | Admit: 2012-09-04 | Discharge: 2012-09-04 | Disposition: A | Discharge status: Home / Self Care | Payer: Medicare Other | Source: Ambulatory Visit | Attending: Surgical | Admitting: Surgical

## 2012-09-04 DIAGNOSIS — Z01818 Encounter for other preprocedural examination: Secondary | ICD-10-CM | POA: Insufficient documentation

## 2012-09-04 HISTORY — DX: Cardiac arrhythmia, unspecified: I49.9

## 2012-09-04 LAB — URINALYSIS, ROUTINE W REFLEX MICROSCOPIC
Bilirubin Urine: NEGATIVE
Glucose, UA: NEGATIVE mg/dL
Hgb urine dipstick: NEGATIVE
Ketones, ur: NEGATIVE mg/dL
Leukocytes, UA: NEGATIVE
Nitrite: NEGATIVE
Protein, ur: NEGATIVE mg/dL
Specific Gravity, Urine: 1.02 (ref 1.005–1.030)
Urobilinogen, UA: 0.2 mg/dL (ref 0.0–1.0)
pH: 6.5 (ref 5.0–8.0)

## 2012-09-04 LAB — COMPREHENSIVE METABOLIC PANEL
ALT: 18 U/L (ref 0–53)
AST: 15 U/L (ref 0–37)
Albumin: 3.9 g/dL (ref 3.5–5.2)
Alkaline Phosphatase: 89 U/L (ref 39–117)
BUN: 11 mg/dL (ref 6–23)
CO2: 31 mEq/L (ref 19–32)
Calcium: 9.1 mg/dL (ref 8.4–10.5)
Chloride: 101 mEq/L (ref 96–112)
Creatinine, Ser: 1.04 mg/dL (ref 0.50–1.35)
GFR calc Af Amer: 82 mL/min — ABNORMAL LOW (ref >90)
GFR calc non Af Amer: 71 mL/min — ABNORMAL LOW (ref >90)
Glucose, Bld: 144 mg/dL — ABNORMAL HIGH (ref 70–99)
Potassium: 4.4 mEq/L (ref 3.5–5.1)
Sodium: 140 mEq/L (ref 135–145)
Total Bilirubin: 0.5 mg/dL (ref 0.3–1.2)
Total Protein: 6.8 g/dL (ref 6.0–8.3)

## 2012-09-04 LAB — SURGICAL PCR SCREEN
MRSA, PCR: NEGATIVE
Staphylococcus aureus: NEGATIVE

## 2012-09-04 LAB — CBC
Hemoglobin: 13.6 g/dL (ref 13.0–17.0)
MCH: 30.6 pg (ref 26.0–34.0)
MCHC: 34.8 g/dL (ref 30.0–36.0)
RDW: 12.5 % (ref 11.5–15.5)

## 2012-09-04 LAB — PROTIME-INR
INR: 0.96 (ref 0.00–1.49)
Prothrombin Time: 12.7 seconds (ref 11.6–15.2)

## 2012-09-04 LAB — APTT: aPTT: 30 seconds (ref 24–37)

## 2012-09-04 NOTE — Patient Instructions (Addendum)
20      Your procedure is scheduled on:  Tuesday 09/10/2012 at 0730 am  Report to [facility] at   0530 AM.  Call this number if you have problems the morning of surgery: [phone]   Remember:   Do not eat food or drink liquids after midnight!  Take these medicines the morning of surgery with A SIP OF WATER: Coreg, Simvastatin   Do not bring valuables to the hospital.  .  Leave suitcase in the car. After surgery it may be brought to your room.  For patients admitted to the hospital, checkout time is 11:00 AM the day of              Discharge.    Special Instructions: See [facility] Preparing  For Surgery Instruction Sheet. Do not wear jewelry, lotions powders, perfumes. Women do not shave  legs or underarms for 12 hours before showers. Contacts, partial plates, or dentures may not be worn into surgery.                          Patients discharged the day of surgery will not be allowed to drive home. If going home the same day of surgery, must have someone stay with you  first 24 hrs.at home and arrange for someone to drive you home from the Hospital.              YOUR DRIVER ZO:[id number]   Please read over the following fact sheets that you were given: MRSA INFORMATION,INCENTIVE SPIROMETRY SHEET, SLEEP APNEA SHEET, BLOOD TRANSFUSION SHEET                            Telford Nab.Ardys Hataway,RN,BSN     [phone]

## 2012-09-04 NOTE — H&P (Signed)
TOTAL KNEE ADMISSION H&P  Patient is being admitted for left total knee arthroplasty.  Subjective:  Chief Complaint:left knee pain.  HPI: [name] [name], 71 y.o. male, has a history of pain and functional disability in the left knee due to arthritis and has failed non-surgical conservative treatments for greater than 12 weeks to includeNSAID's and/or analgesics, corticosteriod injections, activity modification and arthoscopy.  Onset of symptoms was gradual, starting 2 years ago with gradually worsening course since that time. The patient noted prior procedures on the knee to include  arthroscopy and menisectomy on the left knee(s).  Patient currently rates pain in the left knee(s) at 7 out of 10 with activity. Patient has night pain, worsening of pain with activity and weight bearing, pain that interferes with activities of daily living, pain with passive range of motion, crepitus and joint swelling.  Patient has evidence of periarticular osteophytes and joint space narrowing by imaging studies.  There is no active infection.  Patient Active Problem List   Diagnosis Date Noted  . Pre-op evaluation 05/13/2012  . Knee pain 05/13/2012  . General medical examination 12/07/2011  . Edema 12/07/2011  . OSTEOARTHRITIS 01/30/2008  . DYSLIPIDEMIA 12/19/2007  . HYPERTENSION 12/19/2007  . ERECTILE DYSFUNCTION 01/28/2007  . GERD 01/28/2007   Past Medical History  Diagnosis Date  . Dysrhythmia     Past Surgical History  Procedure Date  . Hernia repair     bilateral inguinal hernia  . Hernia repair     umbilical  . Joint replacement 2009    right knee     Current outpatient prescriptions: acetaminophen (TYLENOL) 500 MG tablet, Take 500 mg by mouth every 6 (six) hours as needed. Pain,  aspirin 81 MG tablet, Take 81 mg by mouth daily. ,  carvedilol (COREG) 6.25 MG tablet, Take 1 tablet (6.25 mg total) by mouth 2 (two) times daily with a meal.,  fish oil-omega-3 fatty acids 1000 MG capsule, Take  1 g by mouth daily. ,  losartan (COZAAR) 50 MG tablet, Take 1 tablet (50 mg total) by mouth daily.,    meloxicam (MOBIC) 7.5 MG tablet, Take 7.5 mg by mouth daily. ,  simvastatin (ZOCOR) 80 MG tablet, Take 1 tablet (80 mg total) by mouth daily.,   No Known Allergies  History  Substance Use Topics  . Smoking status: Former Games developer  . Smokeless tobacco: Not on file     Comment: Quit 46 yrs ago  . Alcohol Use: No    Family History Father deceased age 70 due to complications of pneumonia and stroke. Mother deceased age 34 due to cardiac arrest.  Review of Systems  Constitutional: Negative.   HENT: Negative.  Negative for neck pain.   Eyes: Negative.   Respiratory: Negative.   Cardiovascular: Negative.   Gastrointestinal: Negative.   Genitourinary: Negative.   Musculoskeletal: Positive for joint pain. Negative for myalgias, back pain and falls.       Left knee pain  Skin: Negative.   Neurological: Negative.   Endo/Heme/Allergies: Negative.   Psychiatric/Behavioral: Negative.     Objective:  Physical Exam  Constitutional: He is oriented to person, place, and time. He appears well-developed and well-nourished. No distress.  HENT:  Head: Normocephalic and atraumatic.  Right Ear: External ear normal.  Left Ear: External ear normal.  Nose: Nose normal.  Mouth/Throat: Oropharynx is clear and moist.  Eyes: Conjunctivae normal and EOM are normal.  Neck: Normal range of motion. Neck supple. No tracheal deviation present. No thyromegaly present.  Cardiovascular: Normal rate, regular rhythm, normal heart sounds and intact distal pulses.   No murmur heard. Respiratory: Effort normal. No respiratory distress. He has no wheezes. He exhibits no tenderness.  GI: Soft. Bowel sounds are normal. He exhibits no distension and no mass. There is no tenderness.  Musculoskeletal:       Right hip: Normal.       Left hip: Normal.       Right knee: Normal. He exhibits normal range of motion, no  swelling and no erythema. no tenderness found.       Left knee: He exhibits decreased range of motion and swelling. He exhibits no erythema. tenderness found. Medial joint line tenderness noted.       Legs: Lymphadenopathy:    He has no cervical adenopathy.  Neurological: He is alert and oriented to person, place, and time. He has normal strength. No sensory deficit.  Skin: No rash noted. He is not diaphoretic. No erythema.  Psychiatric: He has a normal mood and affect.    Vital signs in last 24 hours: Temp:  [98 F (36.7 C)] 98 F (36.7 C) (11/13 0955) Pulse Rate:  [57] 57  (11/13 0955) Resp:  [16] 16  (11/13 0955) BP: (158)/(75) 158/75 mmHg (11/13 0955) SpO2:  [98 %] 98 % (11/13 0955) Weight:  [128.368 kg (283 lb)] 128.368 kg (283 lb) (11/13 0955)  Labs:   Estimated Body mass index is 37.96 kg/(m^2) as calculated from the following:   Height as of 12/07/11: 5' 11.5"(1.816 m).   Weight as of 05/13/12: 276 lb(125.193 kg).   Imaging Review Plain radiographs demonstrate moderate degenerative joint disease of the left knee(s). The overall alignment ismild varus. The bone quality appears to be fair for age and reported activity level.  Assessment/Plan:  End stage arthritis, left knee   The patient history, physical examination, clinical judgment of the provider and imaging studies are consistent with end stage degenerative joint disease of the left knee(s) and total knee arthroplasty is deemed medically necessary. The treatment options including medical management, injection therapy arthroscopy and arthroplasty were discussed at length. The risks and benefits of total knee arthroplasty were presented and reviewed. The risks due to aseptic loosening, infection, stiffness, patella tracking problems, thromboembolic complications and other imponderables were discussed. The patient acknowledged the explanation, agreed to proceed with the plan and consent was signed. Patient is being admitted  for inpatient treatment for surgery, pain control, PT, OT, prophylactic antibiotics, VTE prophylaxis, progressive ambulation and ADL's and discharge planning. The patient is planning to be discharged home with home health services   [place], [place]

## 2012-09-04 NOTE — Progress Notes (Signed)
09/04/12 0956  OBSTRUCTIVE SLEEP APNEA  Have you ever been diagnosed with sleep apnea through a sleep study? No  Do you snore loudly (loud enough to be heard through closed doors)?  1  Do you often feel tired, fatigued, or sleepy during the daytime? 0  Has anyone observed you stop breathing during your sleep? 0  Do you have, or are you being treated for high blood pressure? 1  BMI more than 35 kg/m2? 1  Age over 70 years old? 1  Neck circumference greater than 40 cm/18 inches? 1  Gender: 1  Obstructive Sleep Apnea Score 6   Score 4 or greater  Results sent to PCP

## 2012-09-05 ENCOUNTER — Encounter: Payer: Self-pay | Admitting: Internal Medicine

## 2012-09-05 ENCOUNTER — Ambulatory Visit (INDEPENDENT_AMBULATORY_CARE_PROVIDER_SITE_OTHER): Payer: Medicare Other | Admitting: Internal Medicine

## 2012-09-05 VITALS — BP 140/80 | HR 49 | Temp 97.9°F | Wt 278.0 lb

## 2012-09-05 DIAGNOSIS — M1990 Unspecified osteoarthritis, unspecified site: Secondary | ICD-10-CM

## 2012-09-05 DIAGNOSIS — I10 Essential (primary) hypertension: Secondary | ICD-10-CM

## 2012-09-05 DIAGNOSIS — Z01818 Encounter for other preprocedural examination: Secondary | ICD-10-CM

## 2012-09-05 DIAGNOSIS — E785 Hyperlipidemia, unspecified: Secondary | ICD-10-CM

## 2012-09-05 LAB — GLUCOSE, RANDOM: Glucose, Bld: 107 mg/dL — ABNORMAL HIGH (ref 70–99)

## 2012-09-05 LAB — LIPID PANEL
HDL: 43.7 mg/dL (ref >39.00)
VLDL: 55.2 mg/dL — ABNORMAL HIGH (ref 0.0–40.0)

## 2012-09-05 LAB — LDL CHOLESTEROL, DIRECT: Direct LDL: 98.9 mg/dL

## 2012-09-05 NOTE — Assessment & Plan Note (Signed)
Since we started beta blockers, he is feeling well, ambulatory BPs remain 140/80 when checked. Plan: No change

## 2012-09-05 NOTE — Progress Notes (Signed)
  Subjective:    Patient ID: [name] [name], male    DOB: 12/19/1940, 71 y.o.   MRN: [id number]  HPI We discussed the following issues: DJD, needs a left total knee replacement High cholesterol, good medication compliance, no apparent side effects Hypertension, we added a beta blocker the last time he was here, since then, ambulatory BPs  140/80  Past Medical History: OA Dyslipidemia GERD    HTN ED adenomatous colon polyps,  h/o tics GI Dr Matthias Hughs  Past Surgical History: right knee arthroscopy right ear -patched hole in ear drum 3 hernia repairs groin/naval area (1) Total knee replacement (right) 6-09  Family History: colon ca--no prostate ca--no CAD - no DM - bro HTN - no stroke - no  Social History: Occupation:  Programme researcher, broadcasting/film/video-- retired 2010 was active, unable to do much d/t OA Married, 2 living children, lost 2 kids   Never Smoked Alcohol use-no   Review of Systems Denies chest pain or shortness of breath, no lower extremity edema. No dyspnea on exertion with activities of daily living. No orthopnea. Snores occasionally, denies persistent fatigue or feeling sleepy throughout the day. Medication list reviewed, good compliance, was recommended to hold aspirin and Tylenol on the days leading up to surgery Recent labs reviewed,   normal except for a blood sugar of 144, we are rechecking    Objective:   Physical Exam General -- alert, well-developed, and overweight appearing. No apparent distress.  Neck --no JVD at 45 Lungs -- normal respiratory effort, no intercostal retractions, no accessory muscle use, and normal breath sounds.   Heart-- normal rate, regular rhythm, no murmur, and no gallop.   Abdomen--soft, non-tender, no distention, no masses, no HSM, no guarding, and no rigidity.   Extremities-- right calf larger than left, nontender. See problem list. Neurologic-- alert & oriented X3 and strength normal in all extremities. Psych-- Cognition and judgment  appear intact. Alert and cooperative with normal attention span and concentration.  not anxious appearing and not depressed appearing.       Assessment & Plan:

## 2012-09-05 NOTE — Assessment & Plan Note (Addendum)
Needs a left total knee replacement. Review of systems negative, recent labs normal Has tolerated well a right total knee replacement  Few years ago, tolerated  well recent left knee arthroscopy. He snores  sometimes but no overt symptoms of sleep apnea. Plan: Cleared for surgery, he is holding Tylenol and aspirin as recommended by surgery.

## 2012-09-05 NOTE — Assessment & Plan Note (Signed)
labs

## 2012-09-05 NOTE — Assessment & Plan Note (Signed)
He had a left knee arthroscopy, now he is recommended a left total knee replacement. See pre op eval

## 2012-09-10 ENCOUNTER — Encounter (HOSPITAL_COMMUNITY)
Admission: RE | Disposition: A | Discharge status: Home Health | Payer: Self-pay | Source: Ambulatory Visit | Attending: Orthopedic Surgery

## 2012-09-10 ENCOUNTER — Encounter (HOSPITAL_COMMUNITY): Payer: Self-pay | Admitting: *Deleted

## 2012-09-10 ENCOUNTER — Inpatient Hospital Stay (HOSPITAL_COMMUNITY)
Admission: RE | Admit: 2012-09-10 | Discharge: 2012-09-13 | DRG: 470 | Disposition: A | Discharge status: Home Health | Payer: Medicare Other | Source: Ambulatory Visit | Attending: Orthopedic Surgery | Admitting: Orthopedic Surgery

## 2012-09-10 ENCOUNTER — Inpatient Hospital Stay (HOSPITAL_COMMUNITY): Payer: Medicare Other

## 2012-09-10 ENCOUNTER — Encounter (HOSPITAL_COMMUNITY): Payer: Self-pay | Admitting: Anesthesiology

## 2012-09-10 ENCOUNTER — Inpatient Hospital Stay (HOSPITAL_COMMUNITY): Payer: Medicare Other | Admitting: Anesthesiology

## 2012-09-10 DIAGNOSIS — I10 Essential (primary) hypertension: Secondary | ICD-10-CM | POA: Diagnosis present

## 2012-09-10 DIAGNOSIS — M1712 Unilateral primary osteoarthritis, left knee: Secondary | ICD-10-CM | POA: Diagnosis present

## 2012-09-10 DIAGNOSIS — Z96659 Presence of unspecified artificial knee joint: Secondary | ICD-10-CM

## 2012-09-10 DIAGNOSIS — M21869 Other specified acquired deformities of unspecified lower leg: Secondary | ICD-10-CM | POA: Diagnosis present

## 2012-09-10 DIAGNOSIS — M1710 Unilateral primary osteoarthritis, unspecified knee: Principal | ICD-10-CM | POA: Diagnosis present

## 2012-09-10 HISTORY — PX: TOTAL KNEE ARTHROPLASTY: SHX125

## 2012-09-10 LAB — TYPE AND SCREEN
ABO/RH(D): O POS
Antibody Screen: NEGATIVE

## 2012-09-10 SURGERY — ARTHROPLASTY, KNEE, TOTAL
Anesthesia: Spinal | Site: Knee | Laterality: Left | Wound class: Clean

## 2012-09-10 MED ORDER — HYDROMORPHONE HCL PF 1 MG/ML IJ SOLN
1.0000 mg | INTRAMUSCULAR | Status: DC | PRN
  Administered 2012-09-10 (×3): 1 mg via INTRAVENOUS
  Filled 2012-09-10 (×3): qty 1

## 2012-09-10 MED ORDER — THROMBIN 5000 UNITS EX SOLR
CUTANEOUS | Status: DC | PRN
  Administered 2012-09-10: 5000 [IU] via TOPICAL

## 2012-09-10 MED ORDER — LACTATED RINGERS IV SOLN
INTRAVENOUS | Status: DC
  Administered 2012-09-10 – 2012-09-11 (×2): via INTRAVENOUS

## 2012-09-10 MED ORDER — PHENOL 1.4 % MT LIQD: 1.0000 | OROMUCOSAL | Status: DC | PRN

## 2012-09-10 MED ORDER — METHOCARBAMOL 100 MG/ML IJ SOLN
500.0000 mg | Freq: Four times a day (QID) | INTRAVENOUS | Status: DC | PRN
  Administered 2012-09-10: 500 mg via INTRAVENOUS
  Filled 2012-09-10: qty 5

## 2012-09-10 MED ORDER — MIDAZOLAM HCL 5 MG/5ML IJ SOLN
INTRAMUSCULAR | Status: DC | PRN
  Administered 2012-09-10 (×2): 0.5 mg via INTRAVENOUS

## 2012-09-10 MED ORDER — DEXTROSE 5 % IV SOLN
3.0000 g | INTRAVENOUS | Status: AC
  Administered 2012-09-10: 3 g via INTRAVENOUS
  Filled 2012-09-10: qty 3000

## 2012-09-10 MED ORDER — ACETAMINOPHEN 10 MG/ML IV SOLN
INTRAVENOUS | Status: DC | PRN
  Administered 2012-09-10: 1000 mg via INTRAVENOUS

## 2012-09-10 MED ORDER — PROPOFOL 10 MG/ML IV EMUL
INTRAVENOUS | Status: DC | PRN
  Administered 2012-09-10: 50 ug/kg/min via INTRAVENOUS

## 2012-09-10 MED ORDER — ONDANSETRON HCL 4 MG/2ML IJ SOLN
4.0000 mg | Freq: Four times a day (QID) | INTRAMUSCULAR | Status: DC | PRN
  Administered 2012-09-10 – 2012-09-11 (×3): 4 mg via INTRAVENOUS
  Filled 2012-09-10 (×3): qty 2

## 2012-09-10 MED ORDER — ATORVASTATIN CALCIUM 40 MG PO TABS
40.0000 mg | ORAL_TABLET | Freq: Every day | ORAL | Status: DC
  Administered 2012-09-11 – 2012-09-12 (×2): 40 mg via ORAL
  Filled 2012-09-10 (×4): qty 1

## 2012-09-10 MED ORDER — BISACODYL 10 MG RE SUPP
10.0000 mg | Freq: Every day | RECTAL | Status: DC | PRN
  Administered 2012-09-12: 10 mg via RECTAL
  Filled 2012-09-10: qty 1

## 2012-09-10 MED ORDER — BUPIVACAINE IN DEXTROSE 0.75-8.25 % IT SOLN
INTRATHECAL | Status: DC | PRN
  Administered 2012-09-10: 2 mL via INTRATHECAL

## 2012-09-10 MED ORDER — LACTATED RINGERS IV SOLN: INTRAVENOUS | Status: DC

## 2012-09-10 MED ORDER — FLEET ENEMA 7-19 GM/118ML RE ENEM: 1.0000 | ENEMA | Freq: Once | RECTAL | Status: AC | PRN

## 2012-09-10 MED ORDER — LACTATED RINGERS IV SOLN
INTRAVENOUS | Status: DC
Start: 2012-09-10 — End: 2012-09-10
  Administered 2012-09-10 (×3): via INTRAVENOUS

## 2012-09-10 MED ORDER — ALUM & MAG HYDROXIDE-SIMETH 200-200-20 MG/5ML PO SUSP: 30.0000 mL | ORAL | Status: DC | PRN

## 2012-09-10 MED ORDER — HYDROCODONE-ACETAMINOPHEN 5-325 MG PO TABS
1.0000 | ORAL_TABLET | ORAL | Status: DC | PRN
  Administered 2012-09-10 – 2012-09-12 (×8): 2 via ORAL
  Administered 2012-09-12: 1 via ORAL
  Administered 2012-09-12: 2 via ORAL
  Administered 2012-09-12: 1 via ORAL
  Administered 2012-09-12: 2 via ORAL
  Administered 2012-09-13 (×2): 1 via ORAL
  Filled 2012-09-10: qty 2
  Filled 2012-09-10: qty 1
  Filled 2012-09-10 (×4): qty 2
  Filled 2012-09-10: qty 1
  Filled 2012-09-10 (×4): qty 2
  Filled 2012-09-10: qty 1
  Filled 2012-09-10 (×2): qty 2

## 2012-09-10 MED ORDER — BUPIVACAINE LIPOSOME 1.3 % IJ SUSP
20.0000 mL | INTRAMUSCULAR | Status: AC
  Administered 2012-09-10: 20 mL
  Filled 2012-09-10: qty 20

## 2012-09-10 MED ORDER — CARVEDILOL 6.25 MG PO TABS
6.2500 mg | ORAL_TABLET | Freq: Two times a day (BID) | ORAL | Status: DC
  Administered 2012-09-10 – 2012-09-13 (×6): 6.25 mg via ORAL
  Filled 2012-09-10 (×9): qty 1

## 2012-09-10 MED ORDER — FERROUS SULFATE 325 (65 FE) MG PO TABS
325.0000 mg | ORAL_TABLET | Freq: Three times a day (TID) | ORAL | Status: DC
  Administered 2012-09-10 – 2012-09-13 (×6): 325 mg via ORAL
  Filled 2012-09-10 (×12): qty 1

## 2012-09-10 MED ORDER — ACETAMINOPHEN 325 MG PO TABS: 650.0000 mg | ORAL_TABLET | Freq: Four times a day (QID) | ORAL | Status: DC | PRN

## 2012-09-10 MED ORDER — SODIUM CHLORIDE 0.9 % IR SOLN
Status: DC | PRN
  Administered 2012-09-10: 1000 mL
  Administered 2012-09-10: 3000 mL
  Administered 2012-09-10: 20 mL

## 2012-09-10 MED ORDER — OXYCODONE-ACETAMINOPHEN 5-325 MG PO TABS: 2.0000 | ORAL_TABLET | ORAL | Status: DC | PRN

## 2012-09-10 MED ORDER — METHOCARBAMOL 500 MG PO TABS
500.0000 mg | ORAL_TABLET | Freq: Four times a day (QID) | ORAL | Status: DC | PRN
  Administered 2012-09-11 – 2012-09-13 (×7): 500 mg via ORAL
  Filled 2012-09-10 (×7): qty 1

## 2012-09-10 MED ORDER — HYDROMORPHONE HCL PF 1 MG/ML IJ SOLN: 0.2500 mg | INTRAMUSCULAR | Status: DC | PRN

## 2012-09-10 MED ORDER — ONDANSETRON HCL 4 MG PO TABS
4.0000 mg | ORAL_TABLET | Freq: Four times a day (QID) | ORAL | Status: DC | PRN
  Administered 2012-09-12: 4 mg via ORAL
  Filled 2012-09-10: qty 1

## 2012-09-10 MED ORDER — MENTHOL 3 MG MT LOZG
1.0000 | LOZENGE | OROMUCOSAL | Status: DC | PRN
  Filled 2012-09-10: qty 9

## 2012-09-10 MED ORDER — CEFAZOLIN SODIUM 1-5 GM-% IV SOLN
1.0000 g | Freq: Four times a day (QID) | INTRAVENOUS | Status: AC
  Administered 2012-09-10 (×2): 1 g via INTRAVENOUS
  Filled 2012-09-10 (×2): qty 50

## 2012-09-10 MED ORDER — FENTANYL CITRATE 0.05 MG/ML IJ SOLN
INTRAMUSCULAR | Status: DC | PRN
  Administered 2012-09-10: 25 ug via INTRAVENOUS

## 2012-09-10 MED ORDER — MEPERIDINE HCL 50 MG/ML IJ SOLN: 6.2500 mg | INTRAMUSCULAR | Status: DC | PRN

## 2012-09-10 MED ORDER — ACETAMINOPHEN 650 MG RE SUPP: 650.0000 mg | Freq: Four times a day (QID) | RECTAL | Status: DC | PRN

## 2012-09-10 MED ORDER — SODIUM CHLORIDE 0.9 % IR SOLN
Status: DC | PRN
  Administered 2012-09-10: 08:00:00

## 2012-09-10 MED ORDER — ONDANSETRON HCL 4 MG/2ML IJ SOLN
INTRAMUSCULAR | Status: DC | PRN
  Administered 2012-09-10: 2 mg via INTRAVENOUS

## 2012-09-10 MED ORDER — RIVAROXABAN 10 MG PO TABS
10.0000 mg | ORAL_TABLET | Freq: Every day | ORAL | Status: DC
  Administered 2012-09-11 – 2012-09-13 (×3): 10 mg via ORAL
  Filled 2012-09-10 (×5): qty 1

## 2012-09-10 MED ORDER — PROMETHAZINE HCL 25 MG/ML IJ SOLN: 6.2500 mg | INTRAMUSCULAR | Status: DC | PRN

## 2012-09-10 MED ORDER — LOSARTAN POTASSIUM 50 MG PO TABS
50.0000 mg | ORAL_TABLET | Freq: Every day | ORAL | Status: DC
  Administered 2012-09-11 – 2012-09-13 (×3): 50 mg via ORAL
  Filled 2012-09-10 (×3): qty 1

## 2012-09-10 MED ORDER — POLYETHYLENE GLYCOL 3350 17 G PO PACK: 17.0000 g | PACK | Freq: Every day | ORAL | Status: DC | PRN

## 2012-09-10 MED ORDER — EPHEDRINE SULFATE 50 MG/ML IJ SOLN
INTRAMUSCULAR | Status: DC | PRN
  Administered 2012-09-10: 10 mg via INTRAVENOUS
  Administered 2012-09-10 (×4): 5 mg via INTRAVENOUS

## 2012-09-10 SURGICAL SUPPLY — 69 items
BAG ZIPLOCK 12X15 (MISCELLANEOUS) ×2 IMPLANT
BANDAGE ELASTIC 4 VELCRO ST LF (GAUZE/BANDAGES/DRESSINGS) ×2 IMPLANT
BANDAGE ELASTIC 6 VELCRO ST LF (GAUZE/BANDAGES/DRESSINGS) ×2 IMPLANT
BANDAGE ESMARK 6X9 LF (GAUZE/BANDAGES/DRESSINGS) ×1 IMPLANT
BANDAGE GAUZE ELAST BULKY 4 IN (GAUZE/BANDAGES/DRESSINGS) ×2 IMPLANT
BLADE SAG 18X100X1.27 (BLADE) ×2 IMPLANT
BLADE SAW SGTL 11.0X1.19X90.0M (BLADE) ×2 IMPLANT
BNDG ESMARK 6X9 LF (GAUZE/BANDAGES/DRESSINGS) ×2
BONE CEMENT GENTAMICIN (Cement) ×4 IMPLANT
CEMENT BONE GENTAMICIN 40 (Cement) ×2 IMPLANT
CLOTH BEACON ORANGE TIMEOUT ST (SAFETY) ×2 IMPLANT
CUFF TOURN SGL QUICK 34 (TOURNIQUET CUFF) ×1
CUFF TRNQT CYL 34X4X40X1 (TOURNIQUET CUFF) ×1 IMPLANT
DRAPE EXTREMITY T 121X128X90 (DRAPE) ×2 IMPLANT
DRAPE INCISE IOBAN 66X45 STRL (DRAPES) ×2 IMPLANT
DRAPE LG THREE QUARTER DISP (DRAPES) ×2 IMPLANT
DRAPE POUCH INSTRU U-SHP 10X18 (DRAPES) ×2 IMPLANT
DRAPE U-SHAPE 47X51 STRL (DRAPES) ×2 IMPLANT
DRSG ADAPTIC 3X8 NADH LF (GAUZE/BANDAGES/DRESSINGS) ×2 IMPLANT
DRSG PAD ABDOMINAL 8X10 ST (GAUZE/BANDAGES/DRESSINGS) ×2 IMPLANT
DURAPREP 26ML APPLICATOR (WOUND CARE) ×2 IMPLANT
ELECT REM PT RETURN 9FT ADLT (ELECTROSURGICAL) ×2
ELECTRODE REM PT RTRN 9FT ADLT (ELECTROSURGICAL) ×1 IMPLANT
EVACUATOR 1/8 PVC DRAIN (DRAIN) ×2 IMPLANT
FACESHIELD LNG OPTICON STERILE (SAFETY) ×12 IMPLANT
GLOVE BIO SURGEON STRL SZ 6.5 (GLOVE) ×4 IMPLANT
GLOVE BIO SURGEON STRL SZ7.5 (GLOVE) ×2 IMPLANT
GLOVE BIOGEL PI IND STRL 7.0 (GLOVE) ×1 IMPLANT
GLOVE BIOGEL PI IND STRL 8 (GLOVE) ×1 IMPLANT
GLOVE BIOGEL PI IND STRL 8.5 (GLOVE) IMPLANT
GLOVE BIOGEL PI INDICATOR 7.0 (GLOVE) ×1
GLOVE BIOGEL PI INDICATOR 8 (GLOVE) ×1
GLOVE BIOGEL PI INDICATOR 8.5 (GLOVE)
GLOVE ECLIPSE 8.0 STRL XLNG CF (GLOVE) ×4 IMPLANT
GLOVE SURG SS PI 6.5 STRL IVOR (GLOVE) ×4 IMPLANT
GOWN PREVENTION PLUS LG XLONG (DISPOSABLE) ×4 IMPLANT
GOWN STRL REIN XL XLG (GOWN DISPOSABLE) ×4 IMPLANT
HANDPIECE INTERPULSE COAX TIP (DISPOSABLE) ×1
IMMOBILIZER KNEE 20 (SOFTGOODS) ×4
IMMOBILIZER KNEE 20 THIGH 36 (SOFTGOODS) ×2 IMPLANT
KIT BASIN OR (CUSTOM PROCEDURE TRAY) ×2 IMPLANT
MANIFOLD NEPTUNE II (INSTRUMENTS) ×2 IMPLANT
NEEDLE HYPO 22GX1.5 SAFETY (NEEDLE) ×2 IMPLANT
NS IRRIG 1000ML POUR BTL (IV SOLUTION) ×2 IMPLANT
PACK TOTAL JOINT (CUSTOM PROCEDURE TRAY) ×2 IMPLANT
POSITIONER SURGICAL ARM (MISCELLANEOUS) ×2 IMPLANT
SET HNDPC FAN SPRY TIP SCT (DISPOSABLE) ×1 IMPLANT
SET PAD KNEE POSITIONER (MISCELLANEOUS) ×2 IMPLANT
SPONGE GAUZE 4X4 12PLY (GAUZE/BANDAGES/DRESSINGS) ×2 IMPLANT
SPONGE LAP 18X18 X RAY DECT (DISPOSABLE) ×2 IMPLANT
SPONGE SURGIFOAM ABS GEL 100 (HEMOSTASIS) ×2 IMPLANT
STAPLER VISISTAT 35W (STAPLE) ×2 IMPLANT
STRIP CLOSURE SKIN 1/2X4 (GAUZE/BANDAGES/DRESSINGS) ×2 IMPLANT
SUCTION FRAZIER 12FR DISP (SUCTIONS) ×2 IMPLANT
SUT BONE WAX W31G (SUTURE) ×2 IMPLANT
SUT MNCRL AB 4-0 PS2 18 (SUTURE) ×2 IMPLANT
SUT VIC AB 0 CT1 27 (SUTURE) ×2
SUT VIC AB 0 CT1 27XBRD ANTBC (SUTURE) ×2 IMPLANT
SUT VIC AB 1 CT1 27 (SUTURE) ×3
SUT VIC AB 1 CT1 27XBRD ANTBC (SUTURE) ×3 IMPLANT
SUT VIC AB 2-0 CT1 27 (SUTURE) ×4
SUT VIC AB 2-0 CT1 TAPERPNT 27 (SUTURE) ×4 IMPLANT
SUT VLOC 180 0 24IN GS25 (SUTURE) ×2 IMPLANT
SYR 20CC LL (SYRINGE) ×2 IMPLANT
TOWEL OR 17X26 10 PK STRL BLUE (TOWEL DISPOSABLE) ×4 IMPLANT
TOWER CARTRIDGE SMART MIX (DISPOSABLE) ×2 IMPLANT
TRAY FOLEY CATH 14FRSI W/METER (CATHETERS) ×2 IMPLANT
WATER STERILE IRR 1500ML POUR (IV SOLUTION) ×4 IMPLANT
WRAP KNEE MAXI GEL POST OP (GAUZE/BANDAGES/DRESSINGS) ×2 IMPLANT

## 2012-09-10 NOTE — Anesthesia Preprocedure Evaluation (Addendum)
Anesthesia Evaluation  Patient identified by MRN, date of birth, ID band Patient awake    Reviewed: Allergy & Precautions, H&P , NPO status , Patient's Chart, lab work & pertinent test results  Airway Mallampati: III TM Distance: >3 FB Neck ROM: Full    Dental No notable dental hx. (+) Partial Upper, Partial Lower and Poor Dentition   Pulmonary neg pulmonary ROS,  breath sounds clear to auscultation  Pulmonary exam normal       Cardiovascular hypertension, Pt. on medications negative cardio ROS  Rhythm:Regular Rate:Normal     Neuro/Psych negative neurological ROS  negative psych ROS   GI/Hepatic negative GI ROS, Neg liver ROS,   Endo/Other  negative endocrine ROS  Renal/GU negative Renal ROS  negative genitourinary   Musculoskeletal negative musculoskeletal ROS (+)   Abdominal   Peds negative pediatric ROS (+)  Hematology negative hematology ROS (+)   Anesthesia Other Findings   Reproductive/Obstetrics negative OB ROS                          Anesthesia Physical Anesthesia Plan  ASA: II  Anesthesia Plan: Spinal   Post-op Pain Management:    Induction:   Airway Management Planned: Simple Face Mask  Additional Equipment:   Intra-op Plan:   Post-operative Plan:   Informed Consent: I have reviewed the patients History and Physical, chart, labs and discussed the procedure including the risks, benefits and alternatives for the proposed anesthesia with the patient or authorized representative who has indicated his/her understanding and acceptance.   Dental advisory given  Plan Discussed with: CRNA  Anesthesia Plan Comments:         Anesthesia Quick Evaluation

## 2012-09-10 NOTE — Transfer of Care (Signed)
Immediate Anesthesia Transfer of Care Note  Patient: [name] [name]  Procedure(s) Performed: Procedure(s) (LRB) with comments: TOTAL KNEE ARTHROPLASTY (Left)  Patient Location: PACU  Anesthesia Type:Regional  Level of Consciousness: awake, alert  and patient cooperative  Airway & Oxygen Therapy: Patient Spontanous Breathing and Patient connected to face mask oxygen  Post-op Assessment: Report given to PACU RN  Post vital signs: Reviewed and stable  Complications: No apparent anesthesia complications

## 2012-09-10 NOTE — Anesthesia Procedure Notes (Signed)
Spinal Patient location during procedure: OR Staffing Anesthesiologist: Floria Brandau Performed by: anesthesiologist  Preanesthetic Checklist Completed: patient identified, site marked, surgical consent, pre-op evaluation, timeout performed, IV checked, risks and benefits discussed and monitors and equipment checked Spinal Block Patient position: sitting Prep: Betadine Patient monitoring: heart rate, continuous pulse ox and blood pressure Approach: left paramedian Location: L3-4 Injection technique: single-shot Needle Needle type: Spinocan  Needle gauge: 22 G Needle length: 9 cm Additional Notes Expiration date of kit checked and confirmed. Patient tolerated procedure well, without complications.     

## 2012-09-10 NOTE — Progress Notes (Signed)
PT Note  Attempted PT eval on POD 0. Pt declined to participate. Will check back on tomorrow. Thanks. Rebeca Alert PT [phone]

## 2012-09-10 NOTE — Progress Notes (Signed)
X-ray results noted 

## 2012-09-10 NOTE — Progress Notes (Signed)
Portable AP and Lateral Left Knee X-rays done. 

## 2012-09-10 NOTE — Progress Notes (Signed)
Utilization review completed.  

## 2012-09-10 NOTE — Interval H&P Note (Signed)
History and Physical Interval Note:  09/10/2012 7:11 AM  [name] [name]  has presented today for surgery, with the diagnosis of osteoarthritis left knee   The various methods of treatment have been discussed with the patient and family. After consideration of risks, benefits and other options for treatment, the patient has consented to  Procedure(s) (LRB) with comments: TOTAL KNEE ARTHROPLASTY (Left) as a surgical intervention .  The patient's history has been reviewed, patient examined, no change in status, stable for surgery.  I have reviewed the patient's chart and labs.  Questions were answered to the patient's satisfaction.     [name] A

## 2012-09-10 NOTE — Progress Notes (Signed)
Dr.Carignan made aware of patient's spinal level and patient's heart rates being in the 40s- O.K. To go to floor

## 2012-09-10 NOTE — Anesthesia Postprocedure Evaluation (Signed)
  Anesthesia Post-op Note  Patient: [name] [name]  Procedure(s) Performed: Procedure(s) (LRB): TOTAL KNEE ARTHROPLASTY (Left)  Patient Location: PACU  Anesthesia Type: Spinal  Level of Consciousness: awake and alert   Airway and Oxygen Therapy: Patient Spontanous Breathing  Post-op Pain: mild  Post-op Assessment: Post-op Vital signs reviewed, Patient's Cardiovascular Status Stable, Respiratory Function Stable, Patent Airway and No signs of Nausea or vomiting  Post-op Vital Signs: stable  Complications: No apparent anesthesia complications

## 2012-09-10 NOTE — Brief Op Note (Signed)
09/10/2012  9:29 AM  PATIENT:  [name] [name]  71 y.o. male  PRE-OPERATIVE DIAGNOSIS:  osteoarthritis left knee   POST-OPERATIVE DIAGNOSIS:  osteoarthritis left knee   PROCEDURE:  Procedure(s) (LRB) with comments: TOTAL KNEE ARTHROPLASTY (Left)  SURGEON:  Surgeon(s) and Role:    * Jacki Cones, MD - Primary  PHYSICIAN ASSISTANT: Dimitri Ped PA  ASSISTANTS: Dimitri Ped [phone] }  ANESTHESIA:   spinal  EBL:  Total I/O In: 1000 [I.V.:1000] Out: 175 [Urine:125; Blood:50]  BLOOD ADMINISTERED:none  DRAINS: (one) Hemovact drain(s) in the Left with  Suction Open   LOCAL MEDICATIONS USED:  BUPIVICAINE 20cc mixed with 20cc Normal Saline  SPECIMEN:  No Specimen  DISPOSITION OF SPECIMEN:  N/A  COUNTS:  YES  TOURNIQUET:  * Missing tourniquet times found for documented tourniquets in log:  [id number] *  DICTATION: .Other Dictation: Dictation Number [id number]  PLAN OF CARE: Admit to inpatient   PATIENT DISPOSITION:  Stable in OR   Delay start of Pharmacological VTE agent (>24hrs) due to surgical blood loss or risk of bleeding: yes

## 2012-09-11 LAB — BASIC METABOLIC PANEL
BUN: 11 mg/dL (ref 6–23)
CO2: 25 mEq/L (ref 19–32)
Chloride: 97 mEq/L (ref 96–112)
GFR calc non Af Amer: 86 mL/min — ABNORMAL LOW (ref >90)
Glucose, Bld: 148 mg/dL — ABNORMAL HIGH (ref 70–99)
Potassium: 3.8 mEq/L (ref 3.5–5.1)
Sodium: 134 mEq/L — ABNORMAL LOW (ref 135–145)

## 2012-09-11 LAB — CBC
MCH: 30.5 pg (ref 26.0–34.0)
MCHC: 34.9 g/dL (ref 30.0–36.0)
Platelets: 221 10*3/uL (ref 150–400)
RDW: 12.5 % (ref 11.5–15.5)

## 2012-09-11 NOTE — Op Note (Signed)
[name], [name] NO.:  [id number]  MEDICAL RECORD NO.:  [id number]  LOCATION:  1621                         FACILITY:  [facility]  PHYSICIAN:  Georges Lynch. Di Jasmer, M.D.DATE OF BIRTH:  24-Jan-1941  DATE OF PROCEDURE:  09/10/2012 DATE OF DISCHARGE:                              OPERATIVE REPORT   SURGEON:  Georges Lynch. Degan Hanser, M.D.  ASSISTANT:  Dimitri Ped, [place]  PREOPERATIVE DIAGNOSES:  Severe degenerative arthritis with a varus deformity of the left knee.  Bone-on-bone with a flexion contracture.  POSTOPERATIVE DIAGNOSES:  Severe degenerative arthritis with a varus deformity of the left knee.  Bone-on-bone with a flexion contracture.  OPERATION:  Left total knee arthroplasty utilizing DePuy system.  The sizes used was a size 5 left femur, posterior cruciate sacrificing, the tibial tray was a size 4.  The insert was a size 5 10-mm thickness rotating platform.  The patella was a size 41 with 3 pegs.  Gentamicin was used in the cement and all three components were cemented.  PROCEDURE:  Under general anesthesia, routine orthopedic prepping and draping, the left lower extremity was carried out.  The patient had 3 g of IV Ancef preop.  At this time, the appropriate time-out was carried out prior to an incision.  I also marked the appropriate left leg in the holding area.  Following that, the leg was exsanguinated with an Esmarch, tourniquet was elevated at 325 mmHg.  The leg was placed in a [facility]  At this time, an incision was made over the anterior aspect of the left knee, two flaps were created.  I then carried out a median parapatellar incision.  The patella was reflected laterally.  The knee was flexed and I did medial and lateral meniscectomies and I excised the anterior and posterior cruciate sacrificing ligament.  At this particular time, I then utilized the initial drill hole in the intercondylar notch in usual fashion.  Guide rod was  inserted in the femoral canal after we thoroughly irrigated out the canal.  I then removed 12 mm thickness off the distal femur at this point.  Following that, I measured the femur to be a size 5, which matched his opposite leg.  Following that, we then utilized the next jig to make our anterior, posterior and chamfering cuts for a size 5 left femur.  After this, we then prepared the tibia in the usual fashion.  We measured the tibial tray to be a size 4.  That was exact same size as the opposite knee.  Initial drill hole was made in the tibial plateau. The guide drill hole was made down into the shaft.  At this time, I then irrigated out the canal, inserted my canal finer and then irrigated again.  We then removed 8-mm thickness off the affected medial side that was quite depressed.  Following that, we then utilized our spacer guide and we had good stability in flexion and extension in medial and lateral.  At this particular time, we then continued preparing the tibia.  We cut our keel cut out of the tibial plateau.  We then went out and cut our notch, cut  out the distal femur.  We thoroughly irrigated out the knee, the trial components were inserted.  We had an excellent fit with a 10-mm thickness insert.  Following that, we then did a resurfacing procedure on the patella for a size 41 patella.  Three drill holes were made in the articular surface of the patella.  We removed all trial components and thoroughly irrigated out the knee and then dried the knee out and cemented all three components in simultaneously.  All loose pieces of cement were removed.  I injected a mixture of 20 mL of Exparel and 20 mL of normal saline.  Part of that mixture was injected initially.  We did inject the remaining part at the end of the procedure.  Following that, we then removed our trial insert, thoroughly water picked out the knee, made sure there were no loose pieces of cement.  We then inserted our  permanent rotating platform size 4 10-mm thickness. We reduced the knee, had excellent function, excellent stability, good flexion and extension.  I then inserted the Hemovac drain and closed the knee in layers in usual fashion.          ______________________________ Georges Lynch Darrelyn Hillock, M.D.     RAG/MEDQ  D:  09/10/2012  T:  09/11/2012  Job:  [id number]

## 2012-09-11 NOTE — Evaluation (Signed)
Physical Therapy Evaluation Patient Details Name: [name] [name] MRN: [id number] DOB: 1940-11-10 Today's Date: 09/11/2012 Time: 4098-1191 PT Time Calculation (min): 24 min  PT Assessment / Plan / Recommendation Clinical Impression  pt is s/p LTKA and will benefit from PT to maximixe independence for home with wife assist    PT Assessment  Patient needs continued PT services    Follow Up Recommendations  Home health PT    Does the patient have the potential to tolerate intense rehabilitation      Barriers to Discharge        Equipment Recommendations  None recommended by PT    Recommendations for Other Services     Frequency 7X/week    Precautions / Restrictions Precautions Precautions: Knee Required Braces or Orthoses: Knee Immobilizer - Left Restrictions Weight Bearing Restrictions: No   Pertinent Vitals/Pain       Mobility  Bed Mobility Bed Mobility: Supine to Sit Supine to Sit: 4: Min assist Details for Bed Mobility Assistance: min with UB, LLE, increased time Transfers Transfers: Sit to Stand;Stand to Sit Sit to Stand: 4: Min assist;From bed;From chair/3-in-1 Stand to Sit: 4: Min assist;To chair/3-in-1 Details for Transfer Assistance: cues for hand placement Ambulation/Gait Ambulation/Gait Assistance: 4: Min assist Ambulation Distance (Feet): 10 Feet (times 2 ) Assistive device: Rolling walker Ambulation/Gait Assistance Details: cues for sequence Gait Pattern: Step-to pattern;Antalgic;Trunk flexed Gait velocity: decreased    Shoulder Instructions     Exercises     PT Diagnosis: Difficulty walking  PT Problem List: Decreased strength;Decreased range of motion;Decreased activity tolerance;Decreased mobility;Decreased balance;Decreased knowledge of use of DME PT Treatment Interventions: DME instruction;Gait training;Functional mobility training;Therapeutic activities;Therapeutic exercise;Balance training;Patient/family education;Stair training   PT  Goals Acute Rehab PT Goals PT Goal Formulation: With patient Time For Goal Achievement: 09/18/12 Potential to Achieve Goals: Good Pt will go Supine/Side to Sit: with supervision PT Goal: Supine/Side to Sit - Progress: Goal set today Pt will go Sit to Supine/Side: with supervision PT Goal: Sit to Supine/Side - Progress: Goal set today Pt will go Sit to Stand: with supervision PT Goal: Sit to Stand - Progress: Goal set today Pt will go Stand to Sit: with supervision PT Goal: Stand to Sit - Progress: Goal set today Pt will Ambulate: 51 - 150 feet;with supervision;with rolling walker PT Goal: Ambulate - Progress: Goal set today Pt will Go Up / Down Stairs: 3-5 stairs;with min assist;with least restrictive assistive device;with rail(s) PT Goal: Up/Down Stairs - Progress: Goal set today Pt will Perform Home Exercise Program: with supervision, verbal cues required/provided PT Goal: Perform Home Exercise Program - Progress: Goal set today  Visit Information  Last PT Received On: 09/11/12 Assistance Needed: +2    Subjective Data  Subjective: I want to go in the BR Patient Stated Goal: home   Prior Functioning  Home Living Lives With: Spouse Available Help at Discharge: Family Type of Home: House Home Access: Stairs to enter [employer] of Steps: 3 at back; 3 steps with 2 handrails in front Entrance Stairs-Rails: None (at back) Home Layout: One level Firefighter: Standard Home Adaptive Equipment: Walker - rolling;Crutches;Raised toilet seat with rails Prior Function Level of Independence: Independent Communication Communication: No difficulties    Cognition  Overall Cognitive Status: Appears within functional limits for tasks assessed/performed Arousal/Alertness: Awake/alert Orientation Level: Appears intact for tasks assessed Behavior During Session: [facility] for tasks performed    Extremity/Trunk Assessment Right Upper Extremity Assessment RUE ROM/Strength/Tone:  [facility] for tasks assessed Left Upper Extremity  Assessment LUE ROM/Strength/Tone: WFL for tasks assessed Right Lower Extremity Assessment RLE ROM/Strength/Tone: [facility] for tasks assessed Left Lower Extremity Assessment LLE ROM/Strength/Tone: Deficits;Due to pain LLE ROM/Strength/Tone Deficits: ankle WFL; knee extension and hip flexion 2+/5   Balance    End of Session PT - End of Session Equipment Utilized During Treatment: Left knee immobilizer Activity Tolerance: Patient limited by fatigue;Treatment limited secondary to medical complications (Comment) (nausea) Patient left: in chair;with call bell/phone within reach;with family/visitor present  GP     [facility] 09/11/2012, 11:13 AM

## 2012-09-12 LAB — CBC
HCT: 31.1 % — ABNORMAL LOW (ref 39.0–52.0)
Hemoglobin: 10.9 g/dL — ABNORMAL LOW (ref 13.0–17.0)
MCH: 30.4 pg (ref 26.0–34.0)
MCV: 86.9 fL (ref 78.0–100.0)
RBC: 3.58 MIL/uL — ABNORMAL LOW (ref 4.22–5.81)

## 2012-09-12 LAB — BASIC METABOLIC PANEL
BUN: 13 mg/dL (ref 6–23)
CO2: 27 mEq/L (ref 19–32)
Calcium: 8.5 mg/dL (ref 8.4–10.5)
Glucose, Bld: 130 mg/dL — ABNORMAL HIGH (ref 70–99)
Sodium: 130 mEq/L — ABNORMAL LOW (ref 135–145)

## 2012-09-12 MED ORDER — BISACODYL 10 MG RE SUPP: 10.0000 mg | Freq: Every day | RECTAL | Status: DC | PRN

## 2012-09-12 MED ORDER — RIVAROXABAN 10 MG PO TABS: 10.0000 mg | ORAL_TABLET | Freq: Every day | ORAL | Status: DC

## 2012-09-12 MED ORDER — OXYCODONE-ACETAMINOPHEN 5-325 MG PO TABS: 2.0000 | ORAL_TABLET | ORAL | Status: DC | PRN

## 2012-09-12 MED ORDER — METHOCARBAMOL 500 MG PO TABS: 500.0000 mg | ORAL_TABLET | Freq: Four times a day (QID) | ORAL | Status: DC | PRN

## 2012-09-12 NOTE — Progress Notes (Signed)
Physical Therapy Treatment Patient Details Name: [name] [name] MRN: [id number] DOB: May 15, 1941 Today's Date: 09/12/2012 Time: 4098-1191 PT Time Calculation (min): 24 min  PT Assessment / Plan / Recommendation Comments on Treatment Session  POD # 2 am session.  Pt OOB in recliner with MAX c/o nausea and over all not feeling well.  Assisted to [facility] for attempted relief then amb in hallway.  Max c/o nausea led to small amount of vomiting.  Reported to RN that this is 2 day c/o.    Follow Up Recommendations  Home health PT     Does the patient have the potential to tolerate intense rehabilitation     Barriers to Discharge        Equipment Recommendations  None recommended by PT    Recommendations for Other Services    Frequency 7X/week   Plan Discharge plan remains appropriate    Precautions / Restrictions Precautions Precautions: Knee Precaution Comments: no pillow under knee Required Braces or Orthoses: Knee Immobilizer - Left Knee Immobilizer - Left: Discontinue once straight leg raise with < 10 degree lag Restrictions Weight Bearing Restrictions: No Other Position/Activity Restrictions: WBAT    Pertinent Vitals/Pain C/o 3/10 L knee pain MAX c/o nausea with vomiting during gait    Mobility  Bed Mobility Bed Mobility: Not assessed Details for Bed Mobility Assistance: Pt OOB in recliner  Transfers Transfers: Sit to Stand;Stand to Sit Sit to Stand: 4: Min assist;From toilet;From chair/3-in-1 Stand to Sit: 4: Min assist;To chair/3-in-1;To bed Details for Transfer Assistance: <25% Vc's on safety with turns and backward gait  Ambulation/Gait Ambulation/Gait Assistance: 4: Min guard Ambulation Distance (Feet): 65 Feet Assistive device: Rolling walker Ambulation/Gait Assistance Details: <25% VC's on proper walker to self distance and upright posture.  Pt c/o max nausea which RN gave meds for but still vomited during gait. Gait Pattern: Step-to pattern;Decreased stance time  - left;Trunk flexed Gait velocity: decreased     PT Goals                                                                progressing    Visit Information  Last PT Received On: 09/12/12 Assistance Needed: +1    Subjective Data  Subjective: I feel sick   Cognition    good   Balance   fair  End of Session PT - End of Session Equipment Utilized During Treatment: Gait belt Activity Tolerance: Other (comment) (Tx limited by MAX c/o nausea and vomiting during gait)   Felecia Shelling  PTA [facility]  Acute  Rehab Pager     [phone]

## 2012-09-12 NOTE — Care Management Note (Addendum)
    Page 1 of 2   09/13/2012     11:09:25 AM   CARE MANAGEMENT NOTE 09/13/2012  Patient:  [name] [name], [name] [name]   Account Number:  [id number]  Date Initiated:  09/12/2012  Documentation initiated by:  Colleen Can  Subjective/Objective Assessment:   dx osteoarthritis knee-left; total knee replacemnt on day of admission     Action/Plan:   CM spoke with patient and spouse. Plans are for patient to return to his home in [place], [place] where spouse will be caregiver. Pt already has RW and BSC. Wants use HH agency in network   Anticipated DC Date:  09/13/2012   Anticipated DC Plan:  HOME W HOME HEALTH SERVICES  In-house referral  Clinical Social Worker      DC Associate Professor  CM consult      [facility] Choice  HOME HEALTH   Choice offered to / List presented to:  [name]   DME arranged  NA      DME agency  NA     HH arranged  HH-2 PT      HH agency  [facility]   Status of service:  Completed, signed off Medicare Important Message given?  NA - LOS <3 / Initial given by admissions (If response is "NO", the following Medicare IM given date fields will be blank) Date Medicare IM given:   Date Additional Medicare IM given:    Discharge Disposition:  HOME W HOME HEALTH SERVICES  Per UR Regulation:    If discussed at Long Length of Stay Meetings, dates discussed:    Comments:  09/13/2012 Damaris Schooner RN CCM [phone] Pt discharging today with [facility] services in place. Spoke with [facility] rep-Kristin who advised that services will start 09/14/2012.   09/12/2012 Colleen Can BSN RN CCM [phone] TCT [facility]-spoke with rep=Kristin who states they can provide HHpt services for patient. Face sheet, HH orders, op note, H&P, PT notes faxed to [phone] with comfirmation.

## 2012-09-12 NOTE — Progress Notes (Signed)
Physical Therapy Treatment Patient Details Name: [name] [name] MRN: [id number] DOB: Jan 05, 1941 Today's Date: 09/12/2012 Time: 4098-1191 PT Time Calculation (min): 16 min  PT Assessment / Plan / Recommendation Comments on Treatment Session  POD # 2 pm session.  Assisted pt OOB to amb in hallway. Less c/o nausea but still feeling sick.  Requested pain meds after amb with c/o 6/10 L knee pain.    Follow Up Recommendations  Home health PT     Does the patient have the potential to tolerate intense rehabilitation     Barriers to Discharge        Equipment Recommendations  None recommended by PT    Recommendations for Other Services    Frequency 7X/week   Plan Discharge plan remains appropriate    Precautions / Restrictions Precautions Precautions: Knee Precaution Comments: no pillow under knee  Required Braces or Orthoses: Knee Immobilizer - Left Knee Immobilizer - Left: Discontinue once straight leg raise with < 10 degree lag Restrictions Weight Bearing Restrictions: No Other Position/Activity Restrictions: WBAT    Pertinent Vitals/Pain C/o 6/10 L knee pain meds requested ICE applied    Mobility  Bed Mobility Bed Mobility: Supine to Sit;Sit to Supine Supine to Sit: 4: Min guard Sit to Supine: 4: Min guard Details for Bed Mobility Assistance: Min Guard assist to support L LE on/off bed and increased time  Transfers Transfers: Sit to Stand;Stand to Sit Sit to Stand: 4: Min guard;From bed Stand to Sit: 4: Min guard;To bed Details for Transfer Assistance: <25% Vc's on safety with turns and backward gait   Ambulation/Gait Ambulation/Gait Assistance: 4: Min guard Ambulation Distance (Feet): 96 Feet Assistive device: Rolling walker Ambulation/Gait Assistance Details: <25% VC's on proper walker to self distance and upright posture Gait Pattern: Step-to pattern;Decreased stance time - left;Trunk flexed Gait velocity: decreased     PT Goals                                                               progressing    Visit Information  Last PT Received On: 09/12/12 Assistance Needed: +1    Subjective Data  Subjective: I have alot of gas Patient Stated Goal: feel better   Cognition    good   Balance   fair  End of Session PT - End of Session Equipment Utilized During Treatment: Gait belt Activity Tolerance: Patient limited by fatigue;Patient limited by pain Patient left: in bed;with call bell/phone within reach;with family/visitor present (ICE to L knee) Nurse Communication: Other (comment) (request for pain meds)   Felecia Shelling  PTA [facility]  Acute  Rehab Pager     [phone]

## 2012-09-12 NOTE — Progress Notes (Signed)
OT Note:  Pt screened for OT.  He has had previous knee surgeries and has no OT needs at this time.  [place], OTR/L [phone] 09/12/2012

## 2012-09-12 NOTE — Progress Notes (Signed)
Pt had nausea episode around 0940 am. Ice chips, ginger ale & wet wash cloth given. Administered Zofran 4mg  that provided relief. Took pain meds with apple sauce & tolerated well. Pt walked with PT around 1150am & had vomiting episode at that time. Will administer Dulcolax supp as ordered prn r/t distention/bloating on abdomen. Bowel sounds (+). Will continue to monitor.

## 2012-09-12 NOTE — Progress Notes (Signed)
Subjective: Doing well. Hbg 10.9.Dressing changed and wound looks fine.   Objective: Vital signs in last 24 hours: Temp:  [96.8 F (36 C)-99.6 F (37.6 C)] 99.1 F (37.3 C) (11/21 0546) Pulse Rate:  [60-75] 69  (11/21 0546) Resp:  [15-16] 16  (11/21 0546) BP: (138-164)/(69-79) 141/69 mmHg (11/21 0546) SpO2:  [90 %-94 %] 90 % (11/21 0546)  Intake/Output from previous day: 11/20 0701 - 11/21 0700 In: 480 [P.O.:480] Out: 175 [Urine:175] Intake/Output this shift: Total I/O In: 240 [P.O.:240] Out: -    Basename 09/12/12 0450 09/11/12 0443  HGB 10.9* 12.1*    Basename 09/12/12 0450 09/11/12 0443  WBC 9.4 9.0  RBC 3.58* 3.97*  HCT 31.1* 34.7*  PLT 223 221    Basename 09/12/12 0450 09/11/12 0443  NA 130* 134*  K 3.7 3.8  CL 93* 97  CO2 27 25  BUN 13 11  CREATININE 1.03 0.85  GLUCOSE 130* 148*  CALCIUM 8.5 8.6   No results found for this basename: LABPT:2,INR:2 in the last 72 hours  Neurovascular intact Dorsiflexion/Plantar flexion intact No cellulitis present  Assessment/Plan: DC tomorrow.   Jolane Bankhead A 09/12/2012, 6:52 AM     Doing

## 2012-09-13 ENCOUNTER — Ambulatory Visit: Payer: Medicare Other | Admitting: Internal Medicine

## 2012-09-13 LAB — CBC
MCH: 30.6 pg (ref 26.0–34.0)
MCHC: 35.4 g/dL (ref 30.0–36.0)
MCV: 86.5 fL (ref 78.0–100.0)
Platelets: 197 10*3/uL (ref 150–400)
RBC: 3.4 MIL/uL — ABNORMAL LOW (ref 4.22–5.81)

## 2012-09-13 NOTE — Progress Notes (Signed)
   Subjective: 3 Days Post-Op Procedure(s) (LRB): TOTAL KNEE ARTHROPLASTY (Left) Patient reports pain as mild.   Patient seen in rounds without Dr. Darrelyn Hillock. Patient is well, and has had no acute complaints or problems. He reports that therapy is going well. He had no issues overnight. Voiding well on his own. Had BM last night. He reports that he is ready to go home today.   Objective: Vital signs in last 24 hours: Temp:  [97.6 F (36.4 C)-98.8 F (37.1 C)] 98.5 F (36.9 C) (11/22 0554) Pulse Rate:  [60-82] 71  (11/22 0554) Resp:  [16] 16  (11/22 0554) BP: (147-166)/(72-81) 148/72 mmHg (11/22 0554) SpO2:  [90 %-96 %] 93 % (11/22 0554)  Intake/Output from previous day:  Intake/Output Summary (Last 24 hours) at 09/13/12 0735 Last data filed at 09/13/12 0711  Gross per 24 hour  Intake    840 ml  Output   1152 ml  Net   -312 ml    Intake/Output this shift: Total I/O In: -  Out: 525 [Urine:525]  Labs:  [facility] 09/13/12 0436 09/12/12 0450 09/11/12 0443  HGB 10.4* 10.9* 12.1*    Basename 09/13/12 0436 09/12/12 0450  WBC 9.6 9.4  RBC 3.40* 3.58*  HCT 29.4* 31.1*  PLT 197 223    Basename 09/12/12 0450 09/11/12 0443  NA 130* 134*  K 3.7 3.8  CL 93* 97  CO2 27 25  BUN 13 11  CREATININE 1.03 0.85  GLUCOSE 130* 148*  CALCIUM 8.5 8.6    EXAM General - Patient is Alert and Oriented Extremity - Neurologically intact Neurovascular intact Dorsiflexion/Plantar flexion intact Dressing/Incision - dressing C/D/I Motor Function - intact, moving foot and toes well on exam.   Past Medical History  Diagnosis Date  . Dysrhythmia     Assessment/Plan: 3 Days Post-Op Procedure(s) (LRB): TOTAL KNEE ARTHROPLASTY (Left) Active Problems:  Primary osteoarthritis of left knee  Estimated Body mass index is 37.70 kg/(m^2) as calculated from the following:   Height as of this encounter: 6\' 0" (1.829 m).   Weight as of this encounter: 278 lb(126.1 kg). Advance diet Up with  therapy D/C IV fluids Discharge home with home health  DVT Prophylaxis - Xarelto Weight-Bearing as tolerated to left leg  He is progressing very well. We are going to discharge him home today with home health. Final arrangements for home health agency being made. Will follow up in office in 2 weeks.   Peniel Biel LAUREN 09/13/2012, 7:35 AM

## 2012-09-13 NOTE — Progress Notes (Signed)
Physical Therapy Treatment Patient Details Name: [name] [name] MRN: [id number] DOB: 1941/09/30 Today's Date: 09/13/2012 Time: 4098-1191 PT Time Calculation (min): 24 min  PT Assessment / Plan / Recommendation Comments on Treatment Session  Pt ambulated in hallway, practiced stairs, and performed exercises in preparation for d/c home today.  Pt had no further questions/concerns regarding d/c.    Follow Up Recommendations  Home health PT     Does the patient have the potential to tolerate intense rehabilitation     Barriers to Discharge        Equipment Recommendations  None recommended by PT    Recommendations for Other Services    Frequency     Plan Discharge plan remains appropriate    Precautions / Restrictions Precautions Precautions: Knee Required Braces or Orthoses: Knee Immobilizer - Left Knee Immobilizer - Left: Discontinue once straight leg raise with < 10 degree lag Restrictions Other Position/Activity Restrictions: WBAT   Pertinent Vitals/Pain 3/10 L knee pain, premedicated, ice applied    Mobility  Transfers Transfers: Sit to Stand;Stand to Sit Sit to Stand: 5: Supervision;With upper extremity assist;From chair/3-in-1 Stand to Sit: 5: Supervision;With upper extremity assist;To chair/3-in-1 Details for Transfer Assistance: no verbal cues necessary Ambulation/Gait Ambulation/Gait Assistance: 4: Min guard;5: Supervision Ambulation Distance (Feet): 120 Feet Assistive device: Rolling walker Ambulation/Gait Assistance Details: verbal cue for heel strike and RW distance Gait Pattern: Step-to pattern;Decreased stance time - left;Trunk flexed Gait velocity: decreased Stairs: Yes Stairs Assistance: 4: Min guard Stairs Assistance Details (indicate cue type and reason): verbal cues for sequence Stair Management Technique: Two rails;Step to pattern;Forwards Number of Stairs: 3     Exercises Total Joint Exercises Ankle Circles/Pumps: AROM;20 reps;Both Quad Sets:  AROM;20 reps;Both Gluteal Sets: AROM;Both;20 reps Towel Squeeze: AROM;20 reps;Both Short Arc Quad: AAROM;Left;15 reps Heel Slides: AROM;Left;15 reps;Seated Hip ABduction/ADduction: AROM;Left;15 reps Straight Leg Raises: AAROM;Left;15 reps   PT Diagnosis:    PT Problem List:   PT Treatment Interventions:     PT Goals Acute Rehab PT Goals PT Goal: Sit to Stand - Progress: Met PT Goal: Stand to Sit - Progress: Met PT Goal: Ambulate - Progress: Partly met PT Goal: Up/Down Stairs - Progress: Met PT Goal: Perform Home Exercise Program - Progress: Progressing toward goal  Visit Information  Last PT Received On: 09/13/12 Assistance Needed: +1    Subjective Data  Subjective: I've been throwing up the past few days but I'm better today.   Cognition  Overall Cognitive Status: Appears within functional limits for tasks assessed/performed    Balance     End of Session PT - End of Session Equipment Utilized During Treatment: Gait belt Activity Tolerance: Patient tolerated treatment well Patient left: in chair;with call bell/phone within reach;with family/visitor present   GP     Jaykwon Morones,KATHrine E 09/13/2012, 11:12 AM Pager: [phone]

## 2012-09-16 NOTE — Discharge Summary (Signed)
Physician Discharge Summary   Patient ID: JARQUEZ MCKNIGHT MRN: [id number] DOB/AGE: 1940/12/30 71 y.o.  Admit date: 09/10/2012 Discharge date: 09/13/2012  Primary Diagnosis:  Osteoarthritis, left knee  Admission Diagnoses:  Past Medical History  Diagnosis Date  . Dysrhythmia    Discharge Diagnoses:   Active Problems:  Primary osteoarthritis of left knee S/P left total knee arthroplasty  Estimated Body mass index is 37.70 kg/(m^2) as calculated from the following:   Height as of this encounter: 6\' 0" (1.829 m).   Weight as of this encounter: 278 lb(126.1 kg).  Classification of overweight in adults according to BMI (WHO, 1998)   Procedure:  Procedure(s) (LRB): TOTAL KNEE ARTHROPLASTY (Left)   Consults: None  HPI: [name], 71 y.o. male, has a history of pain and functional disability in the left knee due to arthritis and has failed non-surgical conservative treatments for greater than 12 weeks to includeNSAID's and/or analgesics, corticosteriod injections, activity modification and arthoscopy. Onset of symptoms was gradual, starting 2 years ago with gradually worsening course since that time. The patient noted prior procedures on the knee to include arthroscopy and menisectomy on the left knee(s). Patient currently rates pain in the left knee(s) at 7 out of 10 with activity. Patient has night pain, worsening of pain with activity and weight bearing, pain that interferes with activities of daily living, pain with passive range of motion, crepitus and joint swelling. Patient has evidence of periarticular osteophytes and joint space narrowing by imaging studies. There is no active infection.   Laboratory Data: Admission on 09/10/2012, Discharged on 09/13/2012  Component Date Value Range Status  . ABO/RH(D) 09/10/2012 O POS   Final  . Antibody Screen 09/10/2012 NEG   Final  . Sample Expiration 09/10/2012 09/13/2012   Final  . WBC 09/11/2012 9.0  4.0 - 10.5 K/uL Final  . RBC  09/11/2012 3.97* 4.22 - 5.81 MIL/uL Final  . Hemoglobin 09/11/2012 12.1* 13.0 - 17.0 g/dL Final  . HCT 21/30/8657 34.7* 39.0 - 52.0 % Final  . MCV 09/11/2012 87.4  78.0 - 100.0 fL Final  . MCH 09/11/2012 30.5  26.0 - 34.0 pg Final  . MCHC 09/11/2012 34.9  30.0 - 36.0 g/dL Final  . RDW 84/69/6295 12.5  11.5 - 15.5 % Final  . Platelets 09/11/2012 221  150 - 400 K/uL Final  . Sodium 09/11/2012 134* 135 - 145 mEq/L Final  . Potassium 09/11/2012 3.8  3.5 - 5.1 mEq/L Final  . Chloride 09/11/2012 97  96 - 112 mEq/L Final  . CO2 09/11/2012 25  19 - 32 mEq/L Final  . Glucose, Bld 09/11/2012 148* 70 - 99 mg/dL Final  . BUN 28/41/3244 11  6 - 23 mg/dL Final  . Creatinine, Ser 09/11/2012 0.85  0.50 - 1.35 mg/dL Final  . Calcium 10/25/7251 8.6  8.4 - 10.5 mg/dL Final  . GFR calc non Af Amer 09/11/2012 86* >90 mL/min Final  . GFR calc Af Amer 09/11/2012 >90  >90 mL/min Final   Comment:                                 The eGFR has been calculated                          using the CKD EPI equation.  This calculation has not been                          validated in all clinical                          situations.                          eGFR's persistently                          <90 mL/min signify                          possible Chronic Kidney Disease.  . WBC 09/12/2012 9.4  4.0 - 10.5 K/uL Final  . RBC 09/12/2012 3.58* 4.22 - 5.81 MIL/uL Final  . Hemoglobin 09/12/2012 10.9* 13.0 - 17.0 g/dL Final  . HCT 40/98/1191 31.1* 39.0 - 52.0 % Final  . MCV 09/12/2012 86.9  78.0 - 100.0 fL Final  . MCH 09/12/2012 30.4  26.0 - 34.0 pg Final  . MCHC 09/12/2012 35.0  30.0 - 36.0 g/dL Final  . RDW 47/82/9562 12.4  11.5 - 15.5 % Final  . Platelets 09/12/2012 223  150 - 400 K/uL Final  . Sodium 09/12/2012 130* 135 - 145 mEq/L Final  . Potassium 09/12/2012 3.7  3.5 - 5.1 mEq/L Final  . Chloride 09/12/2012 93* 96 - 112 mEq/L Final  . CO2 09/12/2012 27  19 - 32 mEq/L Final  .  Glucose, Bld 09/12/2012 130* 70 - 99 mg/dL Final  . BUN 13/05/6577 13  6 - 23 mg/dL Final  . Creatinine, Ser 09/12/2012 1.03  0.50 - 1.35 mg/dL Final  . Calcium 46/96/2952 8.5  8.4 - 10.5 mg/dL Final  . GFR calc non Af Amer 09/12/2012 72* >90 mL/min Final  . GFR calc Af Amer 09/12/2012 83* >90 mL/min Final   Comment:                                 The eGFR has been calculated                          using the CKD EPI equation.                          This calculation has not been                          validated in all clinical                          situations.                          eGFR's persistently                          <90 mL/min signify                          possible Chronic Kidney Disease.  . WBC 09/13/2012 9.6  4.0 - 10.5 K/uL  Final  . RBC 09/13/2012 3.40* 4.22 - 5.81 MIL/uL Final  . Hemoglobin 09/13/2012 10.4* 13.0 - 17.0 g/dL Final  . HCT 04/54/0981 29.4* 39.0 - 52.0 % Final  . MCV 09/13/2012 86.5  78.0 - 100.0 fL Final  . MCH 09/13/2012 30.6  26.0 - 34.0 pg Final  . MCHC 09/13/2012 35.4  30.0 - 36.0 g/dL Final  . RDW 19/14/7829 12.5  11.5 - 15.5 % Final  . Platelets 09/13/2012 197  150 - 400 K/uL Final  Office Visit on 09/05/2012  Component Date Value Range Status  . ALT 09/05/2012 22  0 - 53 U/L Final  . AST 09/05/2012 17  0 - 37 U/L Final  . Cholesterol 09/05/2012 180  0 - 200 mg/dL Final   ATP III Classification       Desirable:  < 200 mg/dL               Borderline High:  200 - 239 mg/dL          High:  > = 562 mg/dL  . Triglycerides 09/05/2012 276.0* 0.0 - 149.0 mg/dL Final   Normal:  <130 mg/dLBorderline High:  150 - 199 mg/dL  . HDL 09/05/2012 43.70  >39.00 mg/dL Final  . VLDL 86/57/8469 55.2* 0.0 - 40.0 mg/dL Final  . Total CHOL/HDL Ratio 09/05/2012 4   Final                  Men          Women1/2 Average Risk     3.4          3.3Average Risk          5.0          4.42X Average Risk          9.6          7.13X Average Risk          15.0           11.0                      . Glucose, Bld 09/05/2012 107* 70 - 99 mg/dL Final  . Direct LDL 62/95/2841 98.9   Final   Optimal:  <100 mg/dLNear or Above Optimal:  100-129 mg/dLBorderline High:  130-159 mg/dLHigh:  160-189 mg/dLVery High:  >190 mg/dL  Hospital Outpatient Visit on 09/04/2012  Component Date Value Range Status  . WBC 09/04/2012 5.9  4.0 - 10.5 K/uL Final  . RBC 09/04/2012 4.45  4.22 - 5.81 MIL/uL Final  . Hemoglobin 09/04/2012 13.6  13.0 - 17.0 g/dL Final  . HCT 32/44/0102 39.1  39.0 - 52.0 % Final  . MCV 09/04/2012 87.9  78.0 - 100.0 fL Final  . MCH 09/04/2012 30.6  26.0 - 34.0 pg Final  . MCHC 09/04/2012 34.8  30.0 - 36.0 g/dL Final  . RDW 72/53/6644 12.5  11.5 - 15.5 % Final  . Platelets 09/04/2012 252  150 - 400 K/uL Final  . MRSA, PCR 09/04/2012 NEGATIVE  NEGATIVE Final  . Staphylococcus aureus 09/04/2012 NEGATIVE  NEGATIVE Final   Comment:                                 The Xpert SA Assay (FDA  approved for NASAL specimens                          in patients over 39 years of age),                          is one component of                          a comprehensive surveillance                          program.  Test performance has                          been validated by [employer] for patients greater                          than or equal to 48 year old.                          It is not intended                          to diagnose infection nor to                          guide or monitor treatment.  Marland Kitchen aPTT 09/04/2012 30  24 - 37 seconds Final  . Sodium 09/04/2012 140  135 - 145 mEq/L Final  . Potassium 09/04/2012 4.4  3.5 - 5.1 mEq/L Final  . Chloride 09/04/2012 101  96 - 112 mEq/L Final  . CO2 09/04/2012 31  19 - 32 mEq/L Final  . Glucose, Bld 09/04/2012 144* 70 - 99 mg/dL Final  . BUN 16/07/9603 11  6 - 23 mg/dL Final  . Creatinine, Ser 09/04/2012 1.04  0.50 - 1.35 mg/dL Final  . Calcium 54/06/8118  9.1  8.4 - 10.5 mg/dL Final  . Total Protein 09/04/2012 6.8  6.0 - 8.3 g/dL Final  . Albumin 14/78/2956 3.9  3.5 - 5.2 g/dL Final  . AST 21/30/8657 15  0 - 37 U/L Final  . ALT 09/04/2012 18  0 - 53 U/L Final  . Alkaline Phosphatase 09/04/2012 89  39 - 117 U/L Final  . Total Bilirubin 09/04/2012 0.5  0.3 - 1.2 mg/dL Final  . GFR calc non Af Amer 09/04/2012 71* >90 mL/min Final  . GFR calc Af Amer 09/04/2012 82* >90 mL/min Final   Comment:                                 The eGFR has been calculated                          using the CKD EPI equation.                          This calculation has not been  validated in all clinical                          situations.                          eGFR's persistently                          <90 mL/min signify                          possible Chronic Kidney Disease.  Marland Kitchen Prothrombin Time 09/04/2012 12.7  11.6 - 15.2 seconds Final  . INR 09/04/2012 0.96  0.00 - 1.49 Final  . Color, Urine 09/04/2012 YELLOW  YELLOW Final  . APPearance 09/04/2012 CLEAR  CLEAR Final  . Specific Gravity, Urine 09/04/2012 1.020  1.005 - 1.030 Final  . pH 09/04/2012 6.5  5.0 - 8.0 Final  . Glucose, UA 09/04/2012 NEGATIVE  NEGATIVE mg/dL Final  . Hgb urine dipstick 09/04/2012 NEGATIVE  NEGATIVE Final  . Bilirubin Urine 09/04/2012 NEGATIVE  NEGATIVE Final  . Ketones, ur 09/04/2012 NEGATIVE  NEGATIVE mg/dL Final  . Protein, ur 16/07/9603 NEGATIVE  NEGATIVE mg/dL Final  . Urobilinogen, UA 09/04/2012 0.2  0.0 - 1.0 mg/dL Final  . Nitrite 54/06/8118 NEGATIVE  NEGATIVE Final  . Leukocytes, UA 09/04/2012 NEGATIVE  NEGATIVE Final   MICROSCOPIC NOT DONE ON URINES WITH NEGATIVE PROTEIN, BLOOD, LEUKOCYTES, NITRITE, OR GLUCOSE <1000 mg/dL.     X-Rays:Dg Chest 2 View  09/04/2012  *RADIOLOGY REPORT*  Clinical Data: Preop  CHEST - 2 VIEW  Comparison: 03/18/2008  Findings: Cardiomediastinal silhouette is unremarkable.  No acute infiltrate or pleural  effusion.  No pulmonary edema.  Mild degenerative changes thoracic spine.  Stable mild compression deformity of the vertebral body.  IMPRESSION: No active disease.  No significant change.   Original Report Authenticated By: Natasha Mead, M.D.    X-ray Knee Left Ap And Lateral  09/10/2012  *RADIOLOGY REPORT*  Clinical Data: Status post left total knee arthroplasty.  LEFT KNEE - 1-2 VIEW  Comparison: No priors.  Findings: Postoperative changes of left total knee arthroplasty are noted.  The femoral and tibial components of the prosthesis appear properly seated without definite periprosthetic fracture or other immediate complicating features.  Gas is present in the joint space, and there is a surgical drain in place.  IMPRESSION: 1.  Status post left total knee arthroplasty, as above, without acute complicating features.   Original Report Authenticated By: Trudie Reed, M.D.     EKG: Orders placed in visit on 05/13/12  . EKG 12-LEAD     Hospital Course: AIDON HERGENRADER is a 72 y.o. who was admitted to [facility]. They were brought to the operating room on 09/10/2012 and underwent Procedure(s): TOTAL KNEE ARTHROPLASTY.  Patient tolerated the procedure well and was later transferred to the recovery room and then to the orthopaedic floor for postoperative care.  They were given PO and IV analgesics for pain control following their surgery.  They were given 24 hours of postoperative antibiotics of  Anti-infectives     Start     Dose/Rate Route Frequency Ordered Stop   09/10/12 1400   ceFAZolin (ANCEF) IVPB 1 g/50 mL premix        1 g 100 mL/hr over 30 Minutes Intravenous Every 6 hours 09/10/12 1136 09/10/12 2019  09/10/12 0812   polymyxin B 500,000 Units, bacitracin 50,000 Units in sodium chloride irrigation 0.9 % 500 mL irrigation  Status:  Discontinued          As needed 09/10/12 0812 09/10/12 1000   09/10/12 0615   ceFAZolin (ANCEF) 3 g in dextrose 5 % 50 mL IVPB        3 g 160 mL/hr  over 30 Minutes Intravenous 60 min pre-op 09/10/12 0615 09/10/12 0715         and started on DVT prophylaxis in the form of Xarelto.   PT and OT were ordered for total joint protocol.  Discharge planning consulted to help with postop disposition and equipment needs.  Patient had a good night on the evening of surgery and started to get up OOB with therapy on day one. Hemovac drain was pulled without difficulty.  Continued to work with therapy into day two.  Dressing was changed on day two and the incision was clean and dry.  By day three, the patient had progressed with therapy and meeting their goals.  Incision was healing well.  Patient was seen in rounds and was ready to go home.   Discharge Medications: Prior to Admission medications   Medication Sig Start Date End Date Taking? Authorizing Provider  carvedilol (COREG) 6.25 MG tablet Take 1 tablet (6.25 mg total) by mouth 2 (two) times daily with a meal. 05/13/12 05/13/13 Yes Wanda Plump, MD  losartan (COZAAR) 50 MG tablet Take 1 tablet (50 mg total) by mouth daily. 01/24/12  Yes Wanda Plump, MD  simvastatin (ZOCOR) 80 MG tablet Take 1 tablet (80 mg total) by mouth daily. 01/24/12  Yes Wanda Plump, MD  bisacodyl (DULCOLAX) 10 MG suppository Place 1 suppository (10 mg total) rectally daily as needed. 09/12/12   Chrissy Ealey Tamala Ser, PA  methocarbamol (ROBAXIN) 500 MG tablet Take 1 tablet (500 mg total) by mouth every 6 (six) hours as needed. 09/12/12   Tukker Byrns Tamala Ser, PA  oxyCODONE-acetaminophen (PERCOCET/ROXICET) 5-325 MG per tablet Take 2 tablets by mouth every 4 (four) hours as needed (Q4-6 hours PRN). 09/12/12   Mohd Clemons Tamala Ser, PA  rivaroxaban (XARELTO) 10 MG TABS tablet Take 1 tablet (10 mg total) by mouth daily with breakfast. 09/12/12   Shelby Anderle Tamala Ser, PA    Diet: Cardiac diet Activity:WBAT Follow-up:in 2 weeks Disposition - Home Discharged Condition: good   Discharge Orders    Future Appointments: Provider:  Department: Dept Phone: Center:   02/03/2013 9:30 AM Wanda Plump, MD [facility]  [place] [phone] [facility]     Future Orders Please Complete By Expires   Diet - low sodium heart healthy      Call MD / Call 911      Comments:   If you experience chest pain or shortness of breath, CALL 911 and be transported to the hospital emergency room.  If you develope a fever above 101 F, pus (white drainage) or increased drainage or redness at the wound, or calf pain, call your surgeon's office.   Constipation Prevention      Comments:   Drink plenty of fluids.  Prune juice may be helpful.  You may use a stool softener, such as Colace (over the counter) 100 mg twice a day.  Use MiraLax (over the counter) for constipation as needed.   Increase activity slowly as tolerated      Discharge instructions      Comments:   Walk with your  walker. Weight bearing as instructed. Home Health Agency will follow you at home for your therapy. Change your dressing daily. Shower only, no tub bath. Call if any temperatures greater than 101 or any wound complications: [phone] during the day and ask for Dr. Jeannetta Ellis nurse, [facility]. Follow up in 2 weeks   Driving restrictions      Comments:   No driving   Do not put a pillow under the knee. Place it under the heel.          Medication List     As of 09/16/2012 10:19 AM    STOP taking these medications         acetaminophen 500 MG tablet   Commonly known as: TYLENOL      aspirin 81 MG tablet      fish oil-omega-3 fatty acids 1000 MG capsule      meloxicam 7.5 MG tablet   Commonly known as: MOBIC      TAKE these medications         bisacodyl 10 MG suppository   Commonly known as: DULCOLAX   Place 1 suppository (10 mg total) rectally daily as needed.      carvedilol 6.25 MG tablet   Commonly known as: COREG   Take 1 tablet (6.25 mg total) by mouth 2 (two) times daily with a meal.      losartan 50 MG tablet    Commonly known as: COZAAR   Take 1 tablet (50 mg total) by mouth daily.      methocarbamol 500 MG tablet   Commonly known as: ROBAXIN   Take 1 tablet (500 mg total) by mouth every 6 (six) hours as needed.      oxyCODONE-acetaminophen 5-325 MG per tablet   Commonly known as: PERCOCET/ROXICET   Take 2 tablets by mouth every 4 (four) hours as needed (Q4-6 hours PRN).      rivaroxaban 10 MG Tabs tablet   Commonly known as: XARELTO   Take 1 tablet (10 mg total) by mouth daily with breakfast.      simvastatin 80 MG tablet   Commonly known as: ZOCOR   Take 1 tablet (80 mg total) by mouth daily.         SignedCeledonio Savage, Keidra Withers LAUREN 09/16/2012, 10:19 AM

## 2012-09-18 ENCOUNTER — Other Ambulatory Visit: Payer: Self-pay | Admitting: Orthopedic Surgery

## 2012-09-18 DIAGNOSIS — M7989 Other specified soft tissue disorders: Secondary | ICD-10-CM

## 2012-09-20 ENCOUNTER — Ambulatory Visit
Admission: RE | Admit: 2012-09-20 | Discharge: 2012-09-20 | Disposition: A | Discharge status: Home / Self Care | Payer: Medicare Other | Source: Ambulatory Visit | Attending: Orthopedic Surgery | Admitting: Orthopedic Surgery

## 2012-09-20 DIAGNOSIS — M7989 Other specified soft tissue disorders: Secondary | ICD-10-CM

## 2012-09-30 ENCOUNTER — Other Ambulatory Visit: Payer: Self-pay | Admitting: Internal Medicine

## 2012-09-30 NOTE — Telephone Encounter (Signed)
Refill done.  

## 2012-10-23 DIAGNOSIS — Z9289 Personal history of other medical treatment: Secondary | ICD-10-CM

## 2012-10-23 HISTORY — DX: Personal history of other medical treatment: Z92.89

## 2012-11-22 ENCOUNTER — Encounter: Payer: Self-pay | Admitting: Neurology

## 2012-11-22 ENCOUNTER — Ambulatory Visit (INDEPENDENT_AMBULATORY_CARE_PROVIDER_SITE_OTHER): Payer: Medicare Other | Admitting: Neurology

## 2012-11-22 VITALS — BP 144/70 | HR 70 | Temp 98.1°F | Resp 12 | Ht 72.0 in | Wt 288.0 lb

## 2012-11-22 DIAGNOSIS — H02402 Unspecified ptosis of left eyelid: Secondary | ICD-10-CM

## 2012-11-22 DIAGNOSIS — H02409 Unspecified ptosis of unspecified eyelid: Secondary | ICD-10-CM

## 2012-11-22 LAB — BASIC METABOLIC PANEL
CO2: 32 mEq/L (ref 19–32)
Calcium: 9.2 mg/dL (ref 8.4–10.5)
Glucose, Bld: 121 mg/dL — ABNORMAL HIGH (ref 70–99)
Sodium: 137 mEq/L (ref 135–145)

## 2012-11-22 MED ORDER — PYRIDOSTIGMINE BROMIDE 60 MG PO TABS: 60.0000 mg | ORAL_TABLET | Freq: Two times a day (BID) | ORAL | Status: DC

## 2012-11-22 NOTE — Progress Notes (Signed)
[name] [name] is a 72 year old retired male with a history of retinal detachment, high blood pressure and left total knee replacement. He is now kindly referred by Ernesto Rutherford, M.D. Of [facility], for the new onset of drooping of the left eyelid that began following knee surgery.  He states that he fell and after falling he would see funny lights were clear to several weeks. I presume this was when he had a retinal detachment. This recovered completely as far as his symptoms. In October, he had arthroscopic surgery of the left knee but did not resolve the knee issue. He did have some reaction to anesthesia and he was nauseated for about 3 days. In November, he had a total knee replacement. Almost immediately after the procedure, he became aware of drooping of the left eyelid. This has persisted. It is noted that the drooping of the eyelid gets worse as the day goes on. When he first wakes up, it's pretty good for a short period of time. 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 When he drives his sedan he has to look out on the horizon and he has more trouble seeing from the left eye.sometimes he has to prop left eye opening with this finger to read. He denies any double vision. He denies any weakness of the shoulders hips or swallowing muscles. He does state that he gets some tingling in the fourth and fifth fingers of the left hand but this problem has been on and off for some time. 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  He has not had problems with visual acuity and this was confirmed on his recent visit with Dr. Dione Booze.  Review of systems is positive for knee pain, occasional nocturia and sometimes a left-sidedfeels funny but he is very tired. Review of systems is otherwise negative.  Past Medical History  Diagnosis Date  . Dysrhythmia   . High blood pressure    Current Outpatient Prescriptions on  File Prior to Visit  Medication Sig Dispense Refill  . bisacodyl (DULCOLAX) 10 MG suppository Place 1 suppository (10 mg total) rectally daily as needed.  8 suppository  0  . carvedilol (COREG) 6.25 MG tablet TAKE 1 TABLET (6.25 MG TOTAL) BY MOUTH 2 (TWO) TIMES DAILY WITH A MEAL.  60 tablet  4  . losartan (COZAAR) 50 MG tablet Take 1 tablet (50 mg total) by mouth daily.  90 tablet  3  . simvastatin (ZOCOR) 80 MG tablet Take 1 tablet (80 mg total) by mouth daily.  90 tablet  3  . methocarbamol (ROBAXIN) 500 MG tablet Take 1 tablet (500 mg total) by mouth every 6 (six) hours as needed.  40 tablet  1  . oxyCODONE-acetaminophen (PERCOCET/ROXICET) 5-325 MG per tablet Take 2 tablets by mouth every 4 (four) hours as needed (Q4-6 hours PRN).  60 tablet  0  . pyridostigmine (MESTINON) 60 MG tablet Take 1 tablet (60 mg total) by mouth 2 (two) times daily.  60 tablet  3  . rivaroxaban (XARELTO) 10 MG TABS tablet Take 1 tablet (10 mg total) by mouth daily with breakfast.  18 tablet  0    Review of patient's allergies indicates no known allergies.   History   Social History  . Marital Status: Married    Spouse Name: N/A    Number of Children: N/A  .  Years of Education: N/A   Occupational History  . Not on file.   Social History Main Topics  . Smoking status: Former Games developer  . Smokeless tobacco: Never Used     Comment: Quit 46 yrs ago  . Alcohol Use: No  . Drug Use: No  . Sexually Active: Not on file   Other Topics Concern  . Not on file   Social History Narrative  . No narrative on file   No family history on file.   BP 144/70  Pulse 70  Temp 98.1 F (36.7 C)  Resp 12  Ht 6' (1.829 m)  Wt 288 lb (130.636 kg)  BMI 39.06 kg/m2   Alert and oriented x 3.  Memory function appears to be intact.  Concentration and attention are normal for educational level and background.  Speech is fluent and without significant word finding difficulty.  Is aware of current events.  No carotid bruits  detected.  Cranial nerve II through XII reveals prominent left eye drooping.  This does improve with 45 seconds rest and does worsen with 30 seconds of upgaze.  Acuity normal, PERL, EOMI, face otherwise symmetric, sensation normal, hearing intact, gag normal and tongue and uvula protrude in the midline. Motor strength is 5 over 5 throughout all limbs.  No atrophy, abnormal tone or tremors. Reflexes are 1+ and symmetric in the upper and lower extremities Sensory exam is intact. Coordination is intact for fine movements and rapid alternating movements in all limbs Gait and station are normal.   Impression: 1. Variable, fatiguing ptosis of the left eye for the last 2+ months since his knee replacement surgery in this 72 year old male. No double vision at this time. 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  Plan: 1. CT of the thymus to rule out thymoma 2. A CH receptor antibody test, both binding and modulating 3. Trial of Mestinon initially at 30-60 mg twice a day. Potential side effects were discussed. 4. Return in 3-4 weeks for followup.

## 2012-11-22 NOTE — Patient Instructions (Addendum)
Follow up in four weeks with Dr. Smiley Houseman.  Your CT scan will be done at [facility] located at [facility] in [place]; third floor. Your appointment is on Wednesday, Feb. 5th at 2:00pm. Nothing to eat or drink two hours prior to the test, except water.  [phone].   Please have lab work done in our office today.

## 2012-11-25 ENCOUNTER — Other Ambulatory Visit: Payer: Self-pay | Admitting: Internal Medicine

## 2012-11-25 NOTE — Telephone Encounter (Signed)
Spoke to pharmacy & pt still has refills left on file. Refill request sent in error.  

## 2012-11-27 ENCOUNTER — Ambulatory Visit: Payer: Medicare Other

## 2012-12-02 ENCOUNTER — Ambulatory Visit (INDEPENDENT_AMBULATORY_CARE_PROVIDER_SITE_OTHER)
Admission: RE | Admit: 2012-12-02 | Discharge: 2012-12-02 | Disposition: A | Discharge status: Home / Self Care | Payer: Medicare Other | Source: Ambulatory Visit | Attending: Neurology | Admitting: Neurology

## 2012-12-02 DIAGNOSIS — H02409 Unspecified ptosis of unspecified eyelid: Secondary | ICD-10-CM

## 2012-12-02 DIAGNOSIS — Z0389 Encounter for observation for other suspected diseases and conditions ruled out: Secondary | ICD-10-CM

## 2012-12-02 DIAGNOSIS — H02402 Unspecified ptosis of left eyelid: Secondary | ICD-10-CM

## 2012-12-02 MED ORDER — IOHEXOL 300 MG/ML  SOLN
80.0000 mL | Freq: Once | INTRAMUSCULAR | Status: AC | PRN
  Administered 2012-12-02: 80 mL via INTRAVENOUS

## 2012-12-04 ENCOUNTER — Telehealth: Payer: Self-pay | Admitting: Neurology

## 2012-12-04 NOTE — Telephone Encounter (Signed)
Message copied by Benay Spice on Wed Dec 04, 2012 10:30 AM ------      Message from: [place], [place] L      Created: Wed Dec 04, 2012  8:16 AM       CT chest and thymus is normal.            Continue mestinon for the droopy eye lid due to myasthenia. ------

## 2012-12-04 NOTE — Telephone Encounter (Signed)
Left a message for the patient to return my call.  

## 2012-12-06 ENCOUNTER — Telehealth: Payer: Self-pay | Admitting: Internal Medicine

## 2012-12-06 NOTE — Telephone Encounter (Signed)
Pt missed the nurses call yesterday re: CT scan. RN reviewed results and informed pt of nurses documentation yesterday that the CT of the thymus and chest are norma. Pt to continue mestinon for myasthenia gravis.

## 2012-12-09 NOTE — Telephone Encounter (Signed)
[facility] was closed due to snow from Wednesday, Feb. 12th at about 1400 until Monday, Feb. 17th at 0800. The patient called on 12/06/12 at 12:59 PM and spoke with the [facility] and received the message that chest CT was normal and to continue his Mestinon. Received this information via fax on 12/09/12.

## 2013-01-01 ENCOUNTER — Ambulatory Visit (INDEPENDENT_AMBULATORY_CARE_PROVIDER_SITE_OTHER): Payer: Medicare Other | Admitting: Neurology

## 2013-01-01 ENCOUNTER — Encounter: Payer: Self-pay | Admitting: Neurology

## 2013-01-01 VITALS — BP 130/76 | HR 52 | Temp 98.2°F | Resp 12 | Wt 288.0 lb

## 2013-01-01 DIAGNOSIS — G7000 Myasthenia gravis without (acute) exacerbation: Secondary | ICD-10-CM

## 2013-01-01 NOTE — Progress Notes (Signed)
[name] [name] returns for followup of his droopy eyelid that was discovered after his knee replacement surgery.  He is doing well with his surgical recovery.    We ordered ach antibody levels.  The binding level is 19.4 which is significantly elevated.  The modulating antibodies are also elevated.  This confirms myasthenia gravis beyond a doubt.    CT of the chest and thymus is unremarkable.  He found that Mestinon 60 mg twice a day caused some loose stools.  He is now on 30 twice a day and he notices it does help.  He is doing pretty well.  No diplopia. No swallowing difficulty, and also noted difficulty with weakness of the arms or legs.  Review of symptoms is positive for improving knee pain and loose stools due to the medications. Review of systems is otherwise negative.  Past Medical History  Diagnosis Date  . Dysrhythmia   . High blood pressure    myasthenia gravis, occular type onset 2013  Current Outpatient Prescriptions on File Prior to Visit  Medication Sig Dispense Refill  . carvedilol (COREG) 6.25 MG tablet TAKE 1 TABLET (6.25 MG TOTAL) BY MOUTH 2 (TWO) TIMES DAILY WITH A MEAL.  60 tablet  4  . fish oil-omega-3 fatty acids 1000 MG capsule Take 1 g by mouth daily.      Marland Kitchen losartan (COZAAR) 50 MG tablet Take 1 tablet (50 mg total) by mouth daily.  90 tablet  3  . methocarbamol (ROBAXIN) 500 MG tablet Take 1 tablet (500 mg total) by mouth every 6 (six) hours as needed.  40 tablet  1  . oxyCODONE-acetaminophen (PERCOCET/ROXICET) 5-325 MG per tablet Take 2 tablets by mouth every 4 (four) hours as needed (Q4-6 hours PRN).  60 tablet  0  . pyridostigmine (MESTINON) 60 MG tablet Take 1 tablet (60 mg total) by mouth 2 (two) times daily.  60 tablet  3  . rivaroxaban (XARELTO) 10 MG TABS tablet Take 1 tablet (10 mg total) by mouth daily with breakfast.  18 tablet  0  . simvastatin (ZOCOR) 80 MG tablet Take 1 tablet (80 mg total) by mouth daily.  90 tablet  3  . aspirin 325 MG tablet Take 325 mg by  mouth daily.      . bisacodyl (DULCOLAX) 10 MG suppository Place 1 suppository (10 mg total) rectally daily as needed.  8 suppository  0  . meloxicam (MOBIC) 7.5 MG tablet Take 7.5 mg by mouth 2 (two) times daily.       No current facility-administered medications on file prior to visit.   taking the mestinon at 30 bid. Review of patient's allergies indicates no known allergies.  History   Social History  . Marital Status: Married    Spouse Name: N/A    Number of Children: N/A  . Years of Education: N/A   Occupational History  . Not on file.   Social History Main Topics  . Smoking status: Former Games developer  . Smokeless tobacco: Never Used     Comment: Quit 46 yrs ago  . Alcohol Use: No  . Drug Use: No  . Sexually Active: Not on file   Other Topics Concern  . Not on file   Social History Narrative  . No narrative on file   No family history on file.   BP 130/76  Pulse 52  Temp(Src) 98.2 F (36.8 C)  Resp 12  Wt 288 lb (130.636 kg)  BMI 39.05 kg/m2   Alert and oriented  x 3.  Memory function appears to be intact.  Concentration and attention are normal for educational level and background.  Speech is fluent and without significant word finding difficulty.  Is aware of current events.  No carotid bruits detected.  Cranial nerve II through XII are within normal limits except for left eye ptosis of moderate degree (last dose of mesinton > 6 hours ago).  This includes normal optic discs and acuity, EOMI, PERLA, facial movement and sensation intact, hearing grossly intact, gag intact,Uvula raises symmetrically and tongue protrudes evenly. Motor strength is 5 over 5 throughout all limbs.  No atrophy, abnormal tone or tremors. Reflexes are 2+ and symmetric in the upper and lower extremities Sensory exam is intact. Coordination is intact for fine movements and rapid alternating movements in all limbs Gait and station are normal.   Impression:  1. Ocular myasthenia gravis onset  after knee surgery in 2013.  This is confirmed with anybody testing.  CT of the thymus is unrevealing.  2. He is responding somewhat to Mestinon but we haven't fine-tuned the regimen yet.  Plan:  1. Try the Mestinon 30 mg t.i.d. Spread out through the day at roughly six-hour intervals. 2. Reported he develops any new weakness in the arms neck or legs or swallowing. 3.  Return in 4 months.

## 2013-01-01 NOTE — Patient Instructions (Addendum)
Your diagnosis is myasthenia gravis of the eye only  Try the mestinin 1/2 tablet 3 times a day for the drooping eyelid  Call for weakness in the body or limbs.  Return in 4 months for follow up

## 2013-01-24 ENCOUNTER — Other Ambulatory Visit: Payer: Self-pay | Admitting: Internal Medicine

## 2013-01-24 NOTE — Telephone Encounter (Signed)
Refill done.  

## 2013-01-30 ENCOUNTER — Ambulatory Visit (INDEPENDENT_AMBULATORY_CARE_PROVIDER_SITE_OTHER): Payer: Medicare Other | Admitting: Internal Medicine

## 2013-01-30 ENCOUNTER — Encounter: Payer: Self-pay | Admitting: Internal Medicine

## 2013-01-30 VITALS — BP 142/84 | HR 52 | Temp 98.0°F | Ht 72.0 in | Wt 284.0 lb

## 2013-01-30 DIAGNOSIS — M1990 Unspecified osteoarthritis, unspecified site: Secondary | ICD-10-CM

## 2013-01-30 DIAGNOSIS — I10 Essential (primary) hypertension: Secondary | ICD-10-CM

## 2013-01-30 DIAGNOSIS — E785 Hyperlipidemia, unspecified: Secondary | ICD-10-CM

## 2013-01-30 DIAGNOSIS — K219 Gastro-esophageal reflux disease without esophagitis: Secondary | ICD-10-CM

## 2013-01-30 DIAGNOSIS — Z0000 Encounter for general adult medical examination without abnormal findings: Secondary | ICD-10-CM

## 2013-01-30 LAB — LIPID PANEL
Cholesterol: 152 mg/dL (ref 0–200)
Total CHOL/HDL Ratio: 5
VLDL: 50.6 mg/dL — ABNORMAL HIGH (ref 0.0–40.0)

## 2013-01-30 LAB — CBC WITH DIFFERENTIAL/PLATELET
Basophils Absolute: 0.1 10*3/uL (ref 0.0–0.1)
Hemoglobin: 13.3 g/dL (ref 13.0–17.0)
Lymphocytes Relative: 23.8 % (ref 12.0–46.0)
Monocytes Relative: 10.4 % (ref 3.0–12.0)
Platelets: 236 10*3/uL (ref 150.0–400.0)
RDW: 13.3 % (ref 11.5–14.6)

## 2013-01-30 LAB — LDL CHOLESTEROL, DIRECT: Direct LDL: 85.5 mg/dL

## 2013-01-30 NOTE — Assessment & Plan Note (Signed)
Good compliance, labs  

## 2013-01-30 NOTE — Assessment & Plan Note (Signed)
Asymptomatic. 

## 2013-01-30 NOTE — Patient Instructions (Addendum)
Check the  blood pressure 2 or 3 times a week, be sure it is between 110/60 and 140/85. If it is consistently higher or lower, let me know   Fall Prevention and Home Safety Falls cause injuries and can affect all age groups. It is possible to use preventive measures to significantly decrease the likelihood of falls. There are many simple measures which can make your home safer and prevent falls. OUTDOORS  Repair cracks and edges of walkways and driveways.  Remove high doorway thresholds.  Trim shrubbery on the main path into your home.  Have good outside lighting.  Clear walkways of tools, rocks, debris, and clutter.  Check that handrails are not broken and are securely fastened. Both sides of steps should have handrails.  Have leaves, snow, and ice cleared regularly.  Use sand or salt on walkways during winter months.  In the garage, clean up grease or oil spills. BATHROOM  Install night lights.  Install grab bars by the toilet and in the tub and shower.  Use non-skid mats or decals in the tub or shower.  Place a plastic non-slip stool in the shower to sit on, if needed.  Keep floors dry and clean up all water on the floor immediately.  Remove soap buildup in the tub or shower on a regular basis.  Secure bath mats with non-slip, double-sided rug tape.  Remove throw rugs and tripping hazards from the floors. BEDROOMS  Install night lights.  Make sure a bedside light is easy to reach.  Do not use oversized bedding.  Keep a telephone by your bedside.  Have a firm chair with side arms to use for getting dressed.  Remove throw rugs and tripping hazards from the floor. KITCHEN  Keep handles on pots and pans turned toward the center of the stove. Use back burners when possible.  Clean up spills quickly and allow time for drying.  Avoid walking on wet floors.  Avoid hot utensils and knives.  Position shelves so they are not too high or low.  Place commonly  used objects within easy reach.  If necessary, use a sturdy step stool with a grab bar when reaching.  Keep electrical cables out of the way.  Do not use floor polish or wax that makes floors slippery. If you must use wax, use non-skid floor wax.  Remove throw rugs and tripping hazards from the floor. STAIRWAYS  Never leave objects on stairs.  Place handrails on both sides of stairways and use them. Fix any loose handrails. Make sure handrails on both sides of the stairways are as long as the stairs.  Check carpeting to make sure it is firmly attached along stairs. Make repairs to worn or loose carpet promptly.  Avoid placing throw rugs at the top or bottom of stairways, or properly secure the rug with carpet tape to prevent slippage. Get rid of throw rugs, if possible.  Have an electrician put in a light switch at the top and bottom of the stairs. OTHER FALL PREVENTION TIPS  Wear low-heel or rubber-soled shoes that are supportive and fit well. Wear closed toe shoes.  When using a stepladder, make sure it is fully opened and both spreaders are firmly locked. Do not climb a closed stepladder.  Add color or contrast paint or tape to grab bars and handrails in your home. Place contrasting color strips on first and last steps.  Learn and use mobility aids as needed. Install an electrical emergency response system.  Turn   on lights to avoid dark areas. Replace light bulbs that burn out immediately. Get light switches that glow.  Arrange furniture to create clear pathways. Keep furniture in the same place.  Firmly attach carpet with non-skid or double-sided tape.  Eliminate uneven floor surfaces.  Select a carpet pattern that does not visually hide the edge of steps.  Be aware of all pets. OTHER HOME SAFETY TIPS  Set the water temperature for 120 F (48.8 C).  Keep emergency numbers on or near the telephone.  Keep smoke detectors on every level of the home and near sleeping  areas. Document Released: 09/29/2002 Document Revised: 04/09/2012 Document Reviewed: 12/29/2011 [employer] Patient Information 2013 [employer].  

## 2013-01-30 NOTE — Assessment & Plan Note (Signed)
Well-controlled, last BMP normal 

## 2013-01-30 NOTE — Assessment & Plan Note (Addendum)
Td 09 pneumonia shot--2010 Had a  zostavax 2012 per pt  Colonoscopy:  Adenomatous Polyp (12/17/2006) Cscope again 4-13, per bx report recheck in 5 years, pt was told 3 years  DRE normal, PSAs consistently normal. encouraged to continue exercise  recommend weight loss, diet discussed

## 2013-01-30 NOTE — Progress Notes (Signed)
  Subjective:    Patient ID: [name] [name], male    DOB: [birth year], 72 y.o.   MRN: [id number]  HPI  Here for Medicare AWV: 1. Risk factors based on Past M, S, F history: reviewed   2. Physical Activities: bike x 20 min 3 times a week 3. Depression/mood: no problems noted or reported   4.  Hearing: R hearing decreased? No tinnitus, declined referral   5. ADL's: independent   6. Fall Risk: no recent fall , see instructions   7. Home Safety: does feel safe at home   8.  Height, weight, &visual acuity: see VS, vision no problems noted or reported, sees eye doctor 9.   Counseling: yes   10.   Labs ordered based on risk factors: yes    11. Referral Coordination, if needed   12.  Care Plan, see a/p 13.  Cognitive Assessment: cognition, memory and motor skills seem appropiate  in addition, we discussed the following DJD-- recovering well from knee surgery last year, able to be more active High cholesterol, good medication compliance. Hypertension, good medication compliance, ambulatory BPs always very good. BP today 142/84. Recently diagnosed with myasthenia gravis, medication is helping. GERD, essentially asymptomatic.  Past Medical History: OA Dyslipidemia GERD    HTN ED adenomatous colon polyps,  h/o tics GI Dr Matthias Hughs Myasthenia Gravis   Past Surgical History  Procedure Laterality Date  . Hernia repair      bilateral inguinal hernia  . Hernia repair      umbilical  . Joint replacement  2009    right knee  . 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 Middle ear surgery Right     ear -patched hole in ear drum    Family History: colon ca--no prostate ca--no CAD - no DM - bro HTN - no stroke - no  Social History: Occupation:  Programme researcher, broadcasting/film/video-- retired 2010  active, recovered well from the TKR married, 2 living children, lost 2 kids    Never Smoked Alcohol use-no   Review of Systems  Respiratory: Negative for cough and shortness of breath.   Cardiovascular: Negative for chest pain and leg swelling.  Gastrointestinal: Negative for abdominal pain and blood in stool.  Genitourinary: Negative for hematuria and difficulty urinating.       Objective:   Physical Exam BP 142/84  Pulse 52  Temp(Src) 98 F (36.7 C) (Oral)  Ht 6' (1.829 m)  Wt 284 lb (128.822 kg)  BMI 38.51 kg/m2  SpO2 96%  General -- alert, well-developed, no apparent distress Neck --no thyromegaly  Lungs -- normal respiratory effort, no intercostal retractions, no accessory muscle use, and normal breath sounds.   Heart-- normal rate, regular rhythm, no murmur, and no gallop.   Abdomen--soft, non-tender, no distention, no masses, no HSM, no guarding, and no rigidity.   Extremities-- trace pretibial edema bilaterally Rectal-- No external abnormalities noted. Normal sphincter tone. No rectal masses or tenderness. Brown stool  Prostate:  Prostate gland firm and smooth, no enlargement, nodularity, tenderness, mass, asymmetry or induration. Neurologic-- alert & oriented X3 and strength normal in all extremities. Psych-- Cognition and judgment appear intact. Alert and cooperative with normal attention span and concentration.  not anxious appearing and not depressed appearing.      Assessment & Plan:

## 2013-01-30 NOTE — Assessment & Plan Note (Signed)
Doing great after left TKR last year

## 2013-02-03 ENCOUNTER — Encounter: Payer: Medicare Other | Admitting: Internal Medicine

## 2013-02-04 ENCOUNTER — Encounter: Payer: Self-pay | Admitting: *Deleted

## 2013-02-18 ENCOUNTER — Other Ambulatory Visit: Payer: Self-pay | Admitting: Internal Medicine

## 2013-02-18 NOTE — Telephone Encounter (Signed)
Refill done.  

## 2013-02-22 ENCOUNTER — Other Ambulatory Visit: Payer: Self-pay | Admitting: Internal Medicine

## 2013-02-24 NOTE — Telephone Encounter (Signed)
Refill done.  

## 2013-04-17 ENCOUNTER — Ambulatory Visit (INDEPENDENT_AMBULATORY_CARE_PROVIDER_SITE_OTHER): Payer: Medicare Other | Admitting: Family Medicine

## 2013-04-17 ENCOUNTER — Encounter: Payer: Self-pay | Admitting: Family Medicine

## 2013-04-17 VITALS — BP 130/78 | HR 62 | Temp 98.1°F | Wt 291.0 lb

## 2013-04-17 DIAGNOSIS — R0602 Shortness of breath: Secondary | ICD-10-CM

## 2013-04-17 DIAGNOSIS — I10 Essential (primary) hypertension: Secondary | ICD-10-CM

## 2013-04-17 DIAGNOSIS — I499 Cardiac arrhythmia, unspecified: Secondary | ICD-10-CM

## 2013-04-17 DIAGNOSIS — R002 Palpitations: Secondary | ICD-10-CM

## 2013-04-17 NOTE — Patient Instructions (Signed)
Palpitations  A palpitation is the feeling that your heartbeat is irregular or is faster than normal. It may feel like your heart is fluttering or skipping a beat. Palpitations are usually not a serious problem. However, in some cases, you may need further medical evaluation. CAUSES  Palpitations can be caused by:  Smoking.  Caffeine or other stimulants, such as diet pills or energy drinks.  Alcohol.  Stress and anxiety.  Strenuous physical activity.  Fatigue.  Certain medicines.  Heart disease, especially if you have a history of arrhythmias. This includes atrial fibrillation, atrial flutter, or supraventricular tachycardia.  An improperly working pacemaker or defibrillator. DIAGNOSIS  To find the cause of your palpitations, your caregiver will take your history and perform a physical exam. Tests may also be done, including:  Electrocardiography (ECG). This test records the heart's electrical activity.  Cardiac monitoring. This allows your caregiver to monitor your heart rate and rhythm in real time.  Holter monitor. This is a portable device that records your heartbeat and can help diagnose heart arrhythmias. It allows your caregiver to track your heart activity for several days, if needed.  Stress tests by exercise or by giving medicine that makes the heart beat faster. TREATMENT  Treatment of palpitations depends on the cause of your symptoms and can vary greatly. Most cases of palpitations do not require any treatment other than time, relaxation, and monitoring your symptoms. Other causes, such as atrial fibrillation, atrial flutter, or supraventricular tachycardia, usually require further treatment. HOME CARE INSTRUCTIONS   Avoid:  Caffeinated coffee, tea, soft drinks, diet pills, and energy drinks.  Chocolate.  Alcohol.  Stop smoking if you smoke.  Reduce your stress and anxiety. Things that can help you relax include:  A method that measures bodily functions so  you can learn to control them (biofeedback).  Yoga.  Meditation.  Physical activity such as swimming, jogging, or walking.  Get plenty of rest and sleep. SEEK MEDICAL CARE IF:   You continue to have a fast or irregular heartbeat beyond 24 hours.  Your palpitations occur more often. SEEK IMMEDIATE MEDICAL CARE IF:  You develop chest pain or shortness of breath.  You have a severe headache.  You feel dizzy, or you faint. MAKE SURE YOU:  Understand these instructions.  Will watch your condition.  Will get help right away if you are not doing well or get worse. Document Released: 10/06/2000 Document Revised: 04/09/2012 Document Reviewed: 12/08/2011 [employer] Patient Information 2014 [employer].  

## 2013-04-17 NOTE — Progress Notes (Signed)
  Subjective:    Patient ID: [name] [name], male    DOB: 1940-12-25, 72 y.o.   MRN: [id number]  HPI Pt is here stating he has a hx of an irregular heart beat for years.  He used to see Dr Clarene Duke but states he retired so he would like a new cardiologist.  He was dx with myasthenia gravis and put on pyridostigmine.   He has been feeling bad since then--- the dose was decreased to 1/2 tab tid but he still can not tolerate that.      Review of Systems Review of Systems  Constitutional: Negative for activity change, appetite change and fatigue.  HENT: Negative for hearing loss, congestion, tinnitus and ear discharge.  dentist q11m Eyes: Negative for visual disturbance (see optho q1y -- vision corrected to 20/20 with glasses).  Respiratory: Negative for cough, chest tightness and shortness of breath.   Cardiovascular: Negative for chest pain, palpitations and leg swelling.  Gastrointestinal: Negative for abdominal pain, diarrhea, constipation and abdominal distention.  Genitourinary: Negative for urgency, frequency, decreased urine volume and difficulty urinating.  Musculoskeletal: Negative for back pain, arthralgias and gait problem.  Skin: Negative for color change, pallor and rash.  Neurological: Negative for dizziness, light-headedness, numbness and headaches.  Hematological: Negative for adenopathy. Does not bruise/bleed easily.  Psychiatric/Behavioral: Negative for suicidal ideas, confusion, sleep disturbance, self-injury, dysphoric mood, decreased concentration and agitation.         Objective:   Physical Exam  BP 130/78  Pulse 62  Temp(Src) 98.1 F (36.7 C) (Oral)  Wt 291 lb (131.997 kg)  BMI 39.46 kg/m2  SpO2 98% General appearance: alert, cooperative, appears stated age and no distress Throat: lips, mucosa, and tongue normal; teeth and gums normal Neck: no adenopathy, no carotid bruit, no JVD, supple, symmetrical, trachea midline and thyroid not enlarged, symmetric, no  tenderness/mass/nodules Lungs: clear to auscultation bilaterally Heart: S1, S2 normal Extremities: edema tr pitting edema       Assessment & Plan:

## 2013-04-18 LAB — CBC WITH DIFFERENTIAL/PLATELET
Basophils Relative: 0.4 % (ref 0.0–3.0)
Eosinophils Absolute: 0.3 10*3/uL (ref 0.0–0.7)
Eosinophils Relative: 4.3 % (ref 0.0–5.0)
Hemoglobin: 13.5 g/dL (ref 13.0–17.0)
Lymphocytes Relative: 23.4 % (ref 12.0–46.0)
MCHC: 34.1 g/dL (ref 30.0–36.0)
MCV: 90.9 fl (ref 78.0–100.0)
Monocytes Absolute: 0.7 10*3/uL (ref 0.1–1.0)
Neutro Abs: 3.9 10*3/uL (ref 1.4–7.7)
RBC: 4.37 Mil/uL (ref 4.22–5.81)
WBC: 6.3 10*3/uL (ref 4.5–10.5)

## 2013-04-18 LAB — BASIC METABOLIC PANEL
CO2: 30 mEq/L (ref 19–32)
Chloride: 104 mEq/L (ref 96–112)
Sodium: 138 mEq/L (ref 135–145)

## 2013-04-18 LAB — HEPATIC FUNCTION PANEL
ALT: 22 U/L (ref 0–53)
Albumin: 4.2 g/dL (ref 3.5–5.2)
Alkaline Phosphatase: 75 U/L (ref 39–117)
Total Protein: 7.2 g/dL (ref 6.0–8.3)

## 2013-04-19 DIAGNOSIS — R002 Palpitations: Secondary | ICD-10-CM | POA: Insufficient documentation

## 2013-04-19 NOTE — Assessment & Plan Note (Addendum)
Pt is requesting a cardiology referral ---his previous cardiologist retired See orders F/u PCP

## 2013-05-09 ENCOUNTER — Ambulatory Visit (INDEPENDENT_AMBULATORY_CARE_PROVIDER_SITE_OTHER): Payer: Medicare Other | Admitting: Neurology

## 2013-05-09 ENCOUNTER — Encounter: Payer: Self-pay | Admitting: Neurology

## 2013-05-09 VITALS — BP 132/68 | HR 50 | Temp 98.1°F | Ht 72.0 in | Wt 287.0 lb

## 2013-05-09 DIAGNOSIS — G7000 Myasthenia gravis without (acute) exacerbation: Secondary | ICD-10-CM

## 2013-05-09 NOTE — Progress Notes (Signed)
NEUROLOGY FOLLOW UP NOTE  [name] [name] MRN: [id number] DOB: 08-29-1941   HISTORY OF PRESENT ILLNESS: [name] [name] is a 72 y.o. 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  He is accompanied by his wife.  He was previously followed by Dr. Smiley Houseman, who has since left the practice.    He developed left eyelid ptosis after knee surgery last November.  He denied any blurred vision or diplopia.  He also noted sensation of numbness on the left side of his face.  He saw ophthalmology, who referred him to neurology.  He was then seen by Dr. Smiley Houseman.  Ach binding antibodies were 19.4 and modulating antibodies were 86.  CT of chest revealed no abnormalities of thymus.  TSH 0.38.  He was started on Mestinon and developed loose stool on dose of 60mg  BID.  Dose was reduced to 30mg  BID, which helped.  He denies neck weakness, dysphasia, difficulty breathing, and generalized fatigue and weakness of his limbs.  He takes the medication at [facility] and again at noon.  He notes positive effects in 45 minutes, which lasts about 4 hours.  He does not take an evening dose because he was told by Dr. Smiley Houseman that it wouldn't make a difference since he will be going to sleep until the next morning anyway.  Symptoms would return around 5pm and I asked him if he notes discomfort around the house due to the lid drop, and he says he doesn't.  He doesn't drive in the evenings anyway, so he prefers to not take the third dose at this time.    He has a long-standing history of bradycardia and possible arrhythmia. At times, he would not racing sensation in his chest.  Recent EKG revealed bradycardia and first-degree A-V block, which was also seen on an EKG from one year ago. He has yet to be evaluated by a cardiologist for several years and has an upcoming appointment with Dr. Kristeen Miss in a week and a half. He is wondering if the Mestinon could be causing this.  PAST MEDICAL HISTORY: Past Medical History  Diagnosis Date  . Dysrhythmia    . High blood pressure   . MG, ocular (myasthenia gravis)     PAST SURGICAL HISTORY: Past Surgical History  Procedure Laterality Date  . Hernia repair      bilateral inguinal hernia  . Hernia repair      umbilical  . Joint replacement  2009    right knee  . 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 Middle ear surgery Right     ear -patched hole in ear drum    MEDICATIONS: Current Outpatient Prescriptions on File Prior to Visit  Medication Sig Dispense Refill  . aspirin 81 MG tablet Take 81 mg by mouth daily.      . carvedilol (COREG) 6.25 MG tablet TAKE 1 TABLET (6.25 MG TOTAL) BY MOUTH 2 (TWO) TIMES DAILY WITH A MEAL.  60 tablet  6  . fish oil-omega-3 fatty acids 1000 MG capsule Take 1 g by mouth daily.      Marland Kitchen losartan (COZAAR) 50 MG tablet TAKE 1 TABLET BY MOUTH EVERY DAY  90 tablet  1  . pyridostigmine (MESTINON) 60 MG tablet Take 1 tablet (60 mg total) by mouth 2 (two) times daily.  60 tablet  3  . simvastatin (ZOCOR) 80  MG tablet TAKE 1 TABLET (80 MG TOTAL) BY MOUTH DAILY.  90 tablet  2  . meloxicam (MOBIC) 7.5 MG tablet Take 7.5 mg by mouth 2 (two) times daily.       No current facility-administered medications on file prior to visit.    ALLERGIES: No Known Allergies  FAMILY HISTORY: No family history on file.  SOCIAL HISTORY: History   Social History  . Marital Status: Married    Spouse Name: N/A    Number of Children: N/A  . Years of Education: N/A   Occupational History  . Not on file.   Social History Main Topics  . Smoking status: Former Games developer  . Smokeless tobacco: Never Used     Comment: Quit 46 yrs ago  . Alcohol Use: No     Comment: none  . Drug Use: No  . Sexually Active: Not on file   Other Topics Concern  . Not on file   Social History Narrative  . No narrative on file    REVIEW OF  SYSTEMS: Constitutional: No fevers, chills, or sweats, no generalized fatigue, change in appetite Eyes: No visual changes, double vision, eye pain Ear, nose and throat: No hearing loss, ear pain, nasal congestion, sore throat Cardiovascular: No chest pain, palpitations Respiratory:  No shortness of breath at rest or with exertion, wheezes GastrointestinaI: No nausea, vomiting, diarrhea, abdominal pain, fecal incontinence Genitourinary:  No dysuria, urinary retention or frequency Musculoskeletal:  No neck pain, back pain Integumentary: No rash, pruritus, skin lesions Neurological: as above Psychiatric: No depression, insomnia, anxiety Endocrine: No palpitations, fatigue, diaphoresis, mood swings, change in appetite, change in weight, increased thirst Hematologic/Lymphatic:  No anemia, purpura, petechiae. Allergic/Immunologic: no itchy/runny eyes, nasal congestion, recent allergic reactions, rashes  PHYSICAL EXAM: Filed Vitals:   05/09/13 0845  BP: 132/68  Pulse: 50  Temp: 98.1 F (36.7 C)   General: No acute distress Head:  Normocephalic/atraumatic Neck: supple, no paraspinal tenderness, full range of motion Back: No paraspinal tenderness Heart: regular rhythm Lungs: Clear to auscultation bilaterally. Vascular: No carotid bruits. Neurological Exam: Mental status: alert and oriented to person, place, time and self, speech fluent and not dysarthric, language intact. Cranial nerves: CN I: not tested CN II: pupils equal, round and reactive to light, visual fields intact, fundi unremarkable. CN III, IV, VI:  full range of motion except trace superior rectus weakness on primary gaze, no nystagmus, left ptosis CN V: facial sensation intact CN VII: upper and lower face symmetric CN VIII: hearing intact CN IX, X: gag intact, uvula midline CN XI: sternocleidomastoid and trapezius muscles intact CN XII: tongue midline Bulk & Tone: normal, no fasciculations. Motor: 5/5 throughout,  including neck flexion and extension. Sensation: Temperature and vibration sensation intact Deep Tendon Reflexes: 2+ throughout, toes downgoing. Finger to nose testing: normal Gait: mild limp to right knee. Romberg negative.  IMPRESSION & PLAN: [name] [name] is a 72 y.o. male with left ocular myasthenia.  Overall, doing well.    Mestinon may cause bradycardia and/or arrhythmia, however this is not likely causing his current symptoms as it is an ongoing issue for many years.  I would like to wait and hear from Dr. Elease Hashimoto regarding if it would be okay for [name] [name] to remain on a cholinesterase inhibitor or that he should be switched to another form of therapy.  The alternative would be immunosuppressive therapy, such as prednisone, azathioprine or mycophenolate mofetil.  I discussed these alternative medications with the patient and his  wife. 1.  Continue the Mestinon 30mg  BID at 8am and noon for now. 2.  Await opinion from Dr. Elease Hashimoto. 3.  Follow up in one month.  Thank you for allowing me to take part in the care of this patient.  Shon Millet, DO  CC:  Willow Ora, MD  Kristeen Miss, MD

## 2013-05-09 NOTE — Patient Instructions (Addendum)
The myasthenia medication can potentially cause change in heart rate.  However, I don't think it is the cause of it, since you had this prior to starting this medication.  I would like to wait and see if cardiology prefers switching to another medication.  The alternative medication would be an immunosuppresant, such as steroids.  See that the cardiologist sends his note to my office.  Follow up in 1 month.  Continue the Mestinon 1/2 tablet twice daily for now.

## 2013-05-22 ENCOUNTER — Encounter (INDEPENDENT_AMBULATORY_CARE_PROVIDER_SITE_OTHER): Payer: Medicare Other

## 2013-05-22 ENCOUNTER — Encounter: Payer: Self-pay | Admitting: *Deleted

## 2013-05-22 ENCOUNTER — Encounter: Payer: Self-pay | Admitting: Cardiovascular Disease

## 2013-05-22 ENCOUNTER — Ambulatory Visit (INDEPENDENT_AMBULATORY_CARE_PROVIDER_SITE_OTHER): Payer: Medicare Other | Admitting: Cardiovascular Disease

## 2013-05-22 VITALS — BP 132/74 | HR 49 | Ht 72.0 in | Wt 284.2 lb

## 2013-05-22 DIAGNOSIS — I498 Other specified cardiac arrhythmias: Secondary | ICD-10-CM

## 2013-05-22 DIAGNOSIS — R001 Bradycardia, unspecified: Secondary | ICD-10-CM

## 2013-05-22 DIAGNOSIS — R002 Palpitations: Secondary | ICD-10-CM

## 2013-05-22 NOTE — Progress Notes (Signed)
Patient ID: [name] [name], male   DOB: [birth year], 72 y.o.   MRN: [id number] E-Cardio 48 Hour Holter monitor applied to patient.

## 2013-05-22 NOTE — Patient Instructions (Addendum)
Your physician has requested that you have an echocardiogram. Echocardiography is a painless test that uses sound waves to create images of your heart. It provides your doctor with information about the size and shape of your heart and how well your heart's chambers and valves are working. This procedure takes approximately one hour. There are no restrictions for this procedure.   Your physician has recommended that you wear a holter monitor. Holter monitors are medical devices that record the heart's electrical activity. Doctors most often use these monitors to diagnose arrhythmias. Arrhythmias are problems with the speed or rhythm of the heartbeat. The monitor is a small, portable device. You can wear one while you do your normal daily activities. This is usually used to diagnose what is causing palpitations/syncope (passing out).   Your physician wants you to follow-up in: 6 months  You will receive a reminder letter in the mail two months in advance. If you don't receive a letter, please call our office to schedule the follow-up appointment.   [facility] Low Glycemic Diet (Source: [facility], 2006) Low Glycemic Foods (20-49) (Decrease risk of developing heart disease) Breakfast Cereals: All-Bran All-Bran Fruit 'n Oats Fiber One Oatmeal (not instant) Oat bran Fruits and fruit juices: (Limit to 1-2 servings per day) Apples Apricots (fresh & dried) Blackberries Blueberries Cherries Cranberries Peaches Pears Plums Prunes Grapefruit Raspberries Strawberries Tangerine Apple juice Grapefruit juice Tomato juice Beans and legumes (fresh-cooked): Black-eyed peas Butter beans Chick peas Lentils  Green beans Lima beans Kidney beans Navy beans Pinto beans Snow peas Non-starchy vegetables: Asparagus, avocado, broccoli, cabbage, cauliflower, celery, cucumber, greens, lettuce, mushrooms, peppers, tomatoes, okra, onions, spinach, summer squash Grains: Barley  Bulgur Rye Wild rice Nuts and oils : Almonds Peanuts Sunflower seeds Hazelnuts Pecans Walnuts Oils that are liquid at room temperature Dairy, fish, meat, soy, and eggs: Milk, skim Lowfat cheese Yogurt, lowfat, fruit sugar sweetened Lean red meat Fish  Skinless chicken & [place] Shellfish Egg whites (up to 3 daily) Soy products  Egg yolks (up to 7 or _____ per week) Moderate Glycemic Foods (50-69) Breakfast Cereals: Bran Buds Bran Chex Just Right Mini-Wheats  Special K Swiss muesli Fruits: Banana (under-ripe) Dates Figs Grapes Kiwi Mango Oranges Raisins Fruit Juices: Cranberry juice Orange juice Beans and legumes: Boston-type baked beans Canned pinto, kidney, or navy beans Green peas Vegetables: Beets Carrots  Sweet potato Yam Corn on the cob Breads: Pita (pocket) bread Oat bran bread Pumpernickel bread Rye bread Wheat bread, high fiber  Grains: Cornmeal Rice, brown Rice, white Couscous Pasta: Macaroni Pizza, cheese Ravioli, meat filled Spaghetti, white  Nuts: Cashews Macadamia Snacks: Chocolate Ice cream, lowfat Muffin Popcorn High Glycemic Foods (70-100)  Breakfast Cereals: Cheerios Corn Chex Corn Flakes Cream of Wheat Grape Nuts Grape Nut Flakes Grits Nutri-Grain Puffed Rice Puffed Wheat Rice Chex Rice Krispies Shredded Wheat Team Total Fruits: Pineapple Watermelon Banana (over-ripe) Beverages: Sodas, sweet tea, pineapple juice Vegetables: Potato, baked, boiled, fried, mashed Jamaica fries Canned or frozen corn Parsnips Winter squash Breads: Most breads (white and whole grain) Bagels Bread sticks Bread stuffing Kaiser roll Dinner rolls Grains: Rice, instant Tapioca, with milk Candy and most cookies Snacks: Donuts Corn chips Jelly beans Pretzels Pastries

## 2013-05-22 NOTE — Assessment & Plan Note (Signed)
[name] [name] presents with palpitations that seem to be exacerbated by his Mestinon therapy. He's had a history of bradycardia for years. He's also had some palpitations in the past and has seen Dr. Julieanne Manson.  He was recently diagnosed with ocular myasthenia gravis. He was started on Mestinon in his palpitations became more acute. He's gradually cut back on his dose of Mestinon his palpitations seem to be improving.  Mestinon can certainly cause bradycardia and first-degree heart block. The carvedilol but he is on for his hypertension can also contribute to bradycardia first-degree heart block.  At this point) neurologist to determine whether or not he really needs to Mestinon. If he needs to go back on the Mestinon and we will discontinue the carvedilol which will allow his heart rate to be faster. On the other hand, if he does not need to Mestinon than he should completely discontinue it since his symptoms have almost completely resolved a lower dose of Mestinon.  Will get an echocardiogram for further evaluation of his left ventricular function. We'll get a 48 hour Holter monitor to evaluate him for arrhythmias. I'll see him back in 6 months for followup visit.

## 2013-05-22 NOTE — Progress Notes (Signed)
[name] [name] Date of Birth  01-12-1941       [facility]    [place] [address]  [address] [place], [place]  [address]   [place], [place]  [address] [phone]     [phone]   Fax  [phone]    Fax [phone]  Problem List: 1. Palpitations 2. myasthemia gravis ( newly diagnosed)  3. Bradycardia 4. Hypertension  History of Present Illness:  [name] [name] is a 72 yo with hx of palpitations ( previously saw Dr. Caprice Kluver) .  He has had a slow HR for years.  He was diagnosed with myasthenia 6 months ago and was started on Mestinon.  His palpitations worsened.  He has tapered his mestinon and his symptoms are better.    He is a retired Scientist, research (physical sciences).   Current Outpatient Prescriptions on File Prior to Visit  Medication Sig Dispense Refill  . aspirin 81 MG tablet Take 81 mg by mouth daily.      . carvedilol (COREG) 6.25 MG tablet TAKE 1 TABLET (6.25 MG TOTAL) BY MOUTH 2 (TWO) TIMES DAILY WITH A MEAL.  60 tablet  6  . fish oil-omega-3 fatty acids 1000 MG capsule Take 1 g by mouth daily.      Marland Kitchen losartan (COZAAR) 50 MG tablet TAKE 1 TABLET BY MOUTH EVERY DAY  90 tablet  1  . pyridostigmine (MESTINON) 60 MG tablet Take 1 tablet (60 mg total) by mouth 2 (two) times daily.  60 tablet  3  . simvastatin (ZOCOR) 80 MG tablet TAKE 1 TABLET (80 MG TOTAL) BY MOUTH DAILY.  90 tablet  2   No current facility-administered medications on file prior to visit.    No Known Allergies  Past Medical History  Diagnosis Date  . Dysrhythmia   . High blood pressure   . MG, ocular (myasthenia gravis)     Past Surgical History  Procedure Laterality Date  . Hernia repair      bilateral inguinal hernia  . Hernia repair      umbilical  . Joint replacement  2009    right knee  . 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 Middle ear surgery Right     ear -patched hole in ear drum    History  Smoking status  . Former Smoker  Smokeless tobacco  . Never Used    Comment: Quit 46 yrs ago    History  Alcohol Use No    Comment: none    Family History  Problem Relation Age of Onset  . Diabetes Brother     Reviw of Systems:  Reviewed in the HPI.  All other systems are negative.  Physical Exam: Blood pressure 132/74, pulse 49, height 6' (1.829 m), weight 284 lb 3.2 oz (128.912 kg). General: Well developed, well nourished, in no acute distress.  Head: Normocephalic, atraumatic, sclera non-icteric, mucus membranes are moist,   Neck: Supple. Carotids are 2 + without bruits. No JVD   Lungs: Clear   Heart: RR, normal S1, S2  Abdomen: Soft, non-tender, non-distended with normal bowel sounds.  Msk:  Strength and tone are normal   Extremities: No clubbing or cyanosis. No edema.  Distal pedal pulses are 2+ and equal    Neuro: CN II - XII intact.  Alert and  oriented X 3.   Psych:  Normal   ECG: May 22, 2013:  Sinus brady at 5 with 1st degree AV block.  Low voltage QR.  Assessment / Plan:

## 2013-05-29 ENCOUNTER — Ambulatory Visit (HOSPITAL_COMMUNITY): Payer: Medicare Other | Attending: Cardiology

## 2013-05-29 DIAGNOSIS — R001 Bradycardia, unspecified: Secondary | ICD-10-CM

## 2013-05-29 DIAGNOSIS — R002 Palpitations: Secondary | ICD-10-CM | POA: Insufficient documentation

## 2013-05-29 DIAGNOSIS — I495 Sick sinus syndrome: Secondary | ICD-10-CM | POA: Insufficient documentation

## 2013-05-29 DIAGNOSIS — Z87891 Personal history of nicotine dependence: Secondary | ICD-10-CM | POA: Insufficient documentation

## 2013-05-29 DIAGNOSIS — I10 Essential (primary) hypertension: Secondary | ICD-10-CM | POA: Insufficient documentation

## 2013-05-29 DIAGNOSIS — R9431 Abnormal electrocardiogram [ECG] [EKG]: Secondary | ICD-10-CM

## 2013-05-29 DIAGNOSIS — I498 Other specified cardiac arrhythmias: Secondary | ICD-10-CM | POA: Insufficient documentation

## 2013-05-29 NOTE — Progress Notes (Signed)
Echocardiogram performed.  

## 2013-05-30 ENCOUNTER — Ambulatory Visit: Payer: Medicare Other

## 2013-05-30 DIAGNOSIS — R002 Palpitations: Secondary | ICD-10-CM

## 2013-05-30 NOTE — Progress Notes (Signed)
Replaced a second E-cardio holter monitor [phone]

## 2013-06-09 ENCOUNTER — Encounter: Payer: Self-pay | Admitting: Neurology

## 2013-06-09 ENCOUNTER — Ambulatory Visit (INDEPENDENT_AMBULATORY_CARE_PROVIDER_SITE_OTHER): Payer: Medicare Other | Admitting: Neurology

## 2013-06-09 VITALS — BP 146/68 | HR 48 | Temp 98.1°F | Wt 285.0 lb

## 2013-06-09 DIAGNOSIS — G7000 Myasthenia gravis without (acute) exacerbation: Secondary | ICD-10-CM

## 2013-06-09 MED ORDER — PYRIDOSTIGMINE BROMIDE 60 MG PO TABS: 30.0000 mg | ORAL_TABLET | Freq: Three times a day (TID) | ORAL | Status: DC

## 2013-06-09 NOTE — Patient Instructions (Addendum)
1.  We will slowly increase the dose of the Mestinon.  Take 1/2 pill twice daily for one week  Then 1/2 pill three times daily. 2.  I will contact Dr. Elease Hashimoto about discontinuing the Coreg in order to give Mestinon a chance. 3.  If you still note palpitations on the the Mestinon while off the Coreg, we will have to consider steroid therapy (prednisone). 4.  For cases of diarrhea, may take Imodium. 5.  Follow up in 2 months.  But CALL IN ONE MONTH WITH AN UPDATE.

## 2013-06-09 NOTE — Progress Notes (Signed)
NEUROLOGY FOLLOW UP OFFICE NOTE  [name] [name] [id number]  HISTORY OF PRESENT ILLNESS: [name] [name] is a 72 year old male who presents for follow up for left ocular myasthenia.  Records and images were personally reviewed where available.  He developed left eyelid ptosis after knee surgery last November.  He denied any blurred vision or diplopia.  Ach binding antibodies were 19.4 and modulating antibodies were 86.  CT of chest revealed no abnormalities of thymus.  TSH 0.38.  He was started on Mestinon and developed loose stool on dose of 60mg  BID.  He has history of bradycardia and first degree heart black.  Palpitations worsened when started on Mestinon.  Dose was reduced to 30mg  BID and then once daily, which did improve his symptoms.  He takes the medication at [facility] and again at noon.  He notes positive effects in 45 minutes, which lasts about 4 hours.  Symptoms would return around 5pm and I asked him if he notes discomfort around the house due to the lid drop, and he says he doesn't.  He doesn't drive in the evenings anyway, so he prefers to not take the third dose at this time.  He denies neck weakness, weakness of extremities, or shortness of breath.  His cardiologist, Dr. Elease Hashimoto, suggested that if he cannot be taken off Mestinon, then he would consider discontinuing the carvedilol.  2D Echo earlier this month revealed LVEF 55-65% with severe LVH and grade I diastolic dysfunction.  Since last visit, things have been stable.  He denies dysarthria, dysphagia, diplopia, neck and extremity weakness.  He occasionally has SOB, such as when walking out to get the mail.  He only takes 1/2 pill in the morning and does not note any fluctuation in ptosis.  His eye does not completely close up during the day.  It will get heavier when he is tired.  PAST MEDICAL HISTORY: Past Medical History  Diagnosis Date  . Dysrhythmia   . High blood pressure   . MG, ocular (myasthenia gravis)      MEDICATIONS: Current Outpatient Prescriptions on File Prior to Visit  Medication Sig Dispense Refill  . aspirin 81 MG tablet Take 81 mg by mouth daily.      . carvedilol (COREG) 6.25 MG tablet TAKE 1 TABLET (6.25 MG TOTAL) BY MOUTH 2 (TWO) TIMES DAILY WITH A MEAL.  60 tablet  6  . fish oil-omega-3 fatty acids 1000 MG capsule Take 1 g by mouth daily.      Marland Kitchen losartan (COZAAR) 50 MG tablet TAKE 1 TABLET BY MOUTH EVERY DAY  90 tablet  1  . simvastatin (ZOCOR) 80 MG tablet TAKE 1 TABLET (80 MG TOTAL) BY MOUTH DAILY.  90 tablet  2   No current facility-administered medications on file prior to visit.    ALLERGIES: No Known Allergies  FAMILY HISTORY: Family History  Problem Relation Age of Onset  . Diabetes Brother     SOCIAL HISTORY: History   Social History  . Marital Status: Married    Spouse Name: N/A    Number of Children: N/A  . Years of Education: N/A   Occupational History  . Not on file.   Social History Main Topics  . Smoking status: Former Games developer  . Smokeless tobacco: Never Used     Comment: Quit 46 yrs ago  . Alcohol Use: No     Comment: none  . Drug Use: No  . Sexual Activity: Not on file  Other Topics Concern  . Not on file   Social History Narrative  . No narrative on file    PHYSICAL EXAM: Filed Vitals:   06/09/13 1449  BP: 146/68  Pulse: 48  Temp: 98.1 F (36.7 C)   General: No acute distress Head:  Normocephalic/atraumatic Neck: supple, no paraspinal tenderness, full range of motion Back: No paraspinal tenderness Neurological Exam: alert and oriented to person, place, and time, speech fluent and not dysarthric, language intact.  Left ptosis, otherwise CN II-XII intact, bulk and tone normal, muscle strength 5/5 throughout, including neck flexors and extensors, finger to nose and heel to shin testing intact, gait normal.  IMPRESSION & PLAN: Ocular myasthenia 1.  Due to limitations in treatment, I feel we should give Mestinon another  try.  I will have him increase 30mg  dose to BID x 1wk, then TID.  For occasional diarrhea, he can use Imodium. 2.  I will communicate with Dr. Elease Hashimoto about trying to discontinue the Coreg for now.  If he still notes palpitations on the increased Mestinon and off the Coreg, then we will have to consider alternative therapy, namely prednisone. 3.  Follow up in 2 months or as needed.  Call in one month with update.  Shon Millet, DO  CC:  Willow Ora, MD  Kristeen Miss, MD

## 2013-06-13 ENCOUNTER — Telehealth: Payer: Self-pay | Admitting: *Deleted

## 2013-06-13 DIAGNOSIS — Z79899 Other long term (current) drug therapy: Secondary | ICD-10-CM

## 2013-06-13 MED ORDER — LOSARTAN POTASSIUM 100 MG PO TABS: 100.0000 mg | ORAL_TABLET | Freq: Every day | ORAL | Status: DC

## 2013-06-13 NOTE — Telephone Encounter (Signed)
Message copied by Antony Odea on Fri Jun 13, 2013  3:03 PM ------      Message from: Vesta Mixer      Created: Fri Jun 13, 2013 11:36 AM       Hi Dr. Everlena Cooper.            We will DC his coreg since he has a clear indication for the mestinon.  I will send this message to my nurse as well      Jodette, will you DC coreg and increase his Losartan to 100 mg a day.  Please check BMP in 3 weeks.  Thanks                        ----- Message -----         From: Cira Servant, DO         Sent: 06/10/2013   8:33 AM           To: Vesta Mixer, MD            Hello Dr. Elease Hashimoto,      I saw [name] [name] in follow up yesterday, regarding his ocular myasthenia.  I would like to try and increase his Mestinon to see if he will indeed tolerate this.  Would you be able to coordinate discontinuing his Coreg?  I would like to give the Mestinon a chance because all alternative therapies for myasthenia are immunosuppressants, such as prednisone, and would like to try and reserve that only if necessary.  Please let me know if you have any further concerns.      [name] [name]       ------

## 2013-06-13 NOTE — Telephone Encounter (Signed)
Pt informed/ lab date given

## 2013-06-16 ENCOUNTER — Telehealth: Payer: Self-pay | Admitting: Cardiovascular Disease

## 2013-06-16 ENCOUNTER — Telehealth: Payer: Self-pay | Admitting: *Deleted

## 2013-06-16 NOTE — Telephone Encounter (Signed)
New Prob  Pt wife has some question regarding his meds. She said she cannot remember which one he is suppose to stop taking.

## 2013-06-16 NOTE — Telephone Encounter (Signed)
PT COULD NOT REMEMBER WHICH MEDS WHERE TO BE STOPPED AND INCREASED, REVIEWED WITH WIFE/ SHE WROTE IT DOWN.

## 2013-06-16 NOTE — Telephone Encounter (Signed)
24 hr monitor, sinus brady with pvc's ordered by Dr Drue Novel

## 2013-07-07 ENCOUNTER — Other Ambulatory Visit (INDEPENDENT_AMBULATORY_CARE_PROVIDER_SITE_OTHER): Payer: Medicare Other

## 2013-07-07 DIAGNOSIS — Z79899 Other long term (current) drug therapy: Secondary | ICD-10-CM

## 2013-07-07 LAB — BASIC METABOLIC PANEL
BUN: 10 mg/dL (ref 6–23)
Chloride: 103 mEq/L (ref 96–112)
GFR: 75.67 mL/min (ref >60.00)
Potassium: 4.2 mEq/L (ref 3.5–5.1)
Sodium: 137 mEq/L (ref 135–145)

## 2013-07-21 ENCOUNTER — Telehealth: Payer: Self-pay | Admitting: Neurology

## 2013-07-21 NOTE — Telephone Encounter (Signed)
The patient called and left me a vm message today at 10:57 to pass on to Dr. Everlena Cooper: "I was to call and let Dr. Everlena Cooper know how I was doing in a month. Tell him that I am about the same. I am taking my medicine like he told me to. If you need to talk to me you can call me at [phone]. Thank you."

## 2013-07-22 NOTE — Telephone Encounter (Signed)
If he has not noted improvement on the Mestinon, then I would start low-dose prednisone (10mg ) daily.  This is a steroid, which is often used in myasthenia gravis.  Side effects may include changes in mood or GI upset.  I would then follow up with me as scheduled next month.

## 2013-07-23 NOTE — Telephone Encounter (Signed)
Called and spoke with the patient. Information given as per Dr. Everlena Cooper below. The patient responded by saying he wanted to wait and see Dr. Everlena Cooper first before starting the steroids. His f/u appointment in scheduled for 08/13/13. Advised to call if questions or concerns. The patient understands. **Dr. Everlena Cooper, just a FYI.

## 2013-08-01 ENCOUNTER — Ambulatory Visit (INDEPENDENT_AMBULATORY_CARE_PROVIDER_SITE_OTHER): Payer: Medicare Other | Admitting: Internal Medicine

## 2013-08-01 ENCOUNTER — Encounter: Payer: Self-pay | Admitting: Internal Medicine

## 2013-08-01 VITALS — BP 136/73 | HR 77 | Temp 98.7°F | Wt 280.4 lb

## 2013-08-01 DIAGNOSIS — I10 Essential (primary) hypertension: Secondary | ICD-10-CM

## 2013-08-01 DIAGNOSIS — G7000 Myasthenia gravis without (acute) exacerbation: Secondary | ICD-10-CM

## 2013-08-01 DIAGNOSIS — R002 Palpitations: Secondary | ICD-10-CM

## 2013-08-01 NOTE — Assessment & Plan Note (Signed)
Doing well w/ Mestinon, no side effects

## 2013-08-01 NOTE — Assessment & Plan Note (Addendum)
BP well controlled with current regimen, cardiology recently discontinued carvedilol and increased losartan, follow up BMP okay. Plan: No change

## 2013-08-01 NOTE — Progress Notes (Signed)
  Subjective:    Patient ID: [name] [name], male    DOB: [birth year], 72 y.o.   MRN: [id number]  HPI Routine office visit Palpitations--since the last time he was here, he saw cardiology due to palpitations, cardiology note reviewed, the d/c carvedilol and  increase losartan. He feels better, no further palpitations. Hypertension--BP currently well controlled with present regimen. Reports good ambulatory BPs.  Past Medical History  Diagnosis Date  . Dysrhythmia   . High blood pressure   . MG, ocular (myasthenia gravis)    Past Surgical History  Procedure Laterality Date  . Hernia repair      bilateral inguinal hernia  . Hernia repair      umbilical  . Joint replacement  2009    right knee  . 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 Middle ear surgery Right     ear -patched hole in ear drum   History   Social History  . Marital Status: Married    Spouse Name: N/A    Number of Children: 2  . Years of Education: N/A   Occupational History  . 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 Smoking status: Former Games developer  . Smokeless tobacco: Never Used     Comment: Quit 46 yrs ago  . Alcohol Use: No     Comment: none  . Drug Use: No  . Sexual Activity: Not on file   Other Topics Concern  . Not on file   Social History Narrative   married, 2 living children, lost 2 kids     Family History  Problem Relation Age of Onset  . Diabetes Brother   . Colon cancer Neg Hx   . Prostate cancer Neg Hx   . Stroke Neg Hx   . CAD Neg Hx     Review of Systems Denies chest pain or shortness or breath Declined a flu shot, will get one in few days with his wife History of myasthenia gravis, on Mestinon, good compliance, good control of symptoms on no apparent side effects at this point.     Objective:   Physical Exam BP 136/73   Pulse 77  Temp(Src) 98.7 F (37.1 C)  Wt 280 lb 6.4 oz (127.189 kg)  BMI 38.02 kg/m2  SpO2 97% General -- alert, well-developed, NAD.   Lungs -- normal respiratory effort, no intercostal retractions, no accessory muscle use, and normal breath sounds.  Heart-- normal rate, regular rhythm, no murmur.  Extremities-- trace pretibial edema bilaterally  Neurologic--  alert & oriented X3. Speech normal, gait normal, strength normal in all extremities. Eye lids symmetric Psych-- Cognition and judgment appear intact. Cooperative with normal attention span and concentration. No anxious appearing , no depressed appearing.        Assessment & Plan:

## 2013-08-01 NOTE — Assessment & Plan Note (Signed)
Doing better, symptoms essentially resolved

## 2013-08-13 ENCOUNTER — Encounter: Payer: Self-pay | Admitting: Neurology

## 2013-08-13 ENCOUNTER — Ambulatory Visit (INDEPENDENT_AMBULATORY_CARE_PROVIDER_SITE_OTHER): Payer: Medicare Other | Admitting: Neurology

## 2013-08-13 VITALS — BP 130/70 | HR 62 | Temp 98.1°F | Ht 72.0 in | Wt 285.0 lb

## 2013-08-13 DIAGNOSIS — G7000 Myasthenia gravis without (acute) exacerbation: Secondary | ICD-10-CM

## 2013-08-13 NOTE — Patient Instructions (Signed)
1.  Try taking a whole pill of Mestinon in the afternoon.  Call in a couple of days with an update.  Continue the 1/2 pill in the morning and evening for now. 2.  Still need to consider starting a small dose of steroid if you cannot tolerate higher doses of the Mestinon. 3.  Schedule follow up in 3 months.  Call with any questions or concerns.

## 2013-08-13 NOTE — Progress Notes (Signed)
NEUROLOGY FOLLOW UP OFFICE NOTE  [name] [name] [id number]  HISTORY OF PRESENT ILLNESS: [name] [name] is a 72 year old man with history of palpitations, bradycardia, and high blood pressure who returns to follow up for ocular myasthenia.  Records and images were personally reviewed where available.  He developed left eyelid ptosis after knee surgery last November.  He denied any blurred vision or diplopia.  Ach binding antibodies were 19.4 and modulating antibodies were 86.  CT of chest revealed no abnormalities of thymus.  TSH 0.38.  He was started on Mestinon and developed loose stool on dose of 60mg  BID.  He has history of bradycardia and first degree heart black.  Palpitations worsened when started on Mestinon.  Dose was reduced to 30mg  BID and then once daily, which did improve his symptoms.  His cardiologist, Dr. Elease Hashimoto, discontinued the carvedilol to see if this contributed to the bradycardia.  Based on the vitals flowsheet, it has improved a bit, from the high 40s-50s in August to the 60s-70s.  I asked him to try taking Mestinon 30mg  TID.  Sometimes he skips doses.  He does notice some improvement in the ptosis after a dose.  He doesn't like taking a whole pill at night because he says it contributed to symptoms such as painful urination.  He still notes loose stool after a dose but feels that the loose stool has improved and maybe his body is getting used to the medication.  He denies worsening shortness of breath, dysphagia or limb weakness.  2D Echo earlier this month revealed LVEF 55-65% with severe LVH and grade I diastolic dysfunction.  PAST MEDICAL HISTORY: Past Medical History  Diagnosis Date  . Dysrhythmia   . High blood pressure   . MG, ocular (myasthenia gravis)     MEDICATIONS: Current Outpatient Prescriptions on File Prior to Visit  Medication Sig Dispense Refill  . aspirin 81 MG tablet Take 81 mg by mouth daily.      . fish oil-omega-3 fatty acids 1000 MG capsule Take 1  g by mouth daily.      Marland Kitchen losartan (COZAAR) 100 MG tablet Take 1 tablet (100 mg total) by mouth daily.  90 tablet  1  . pyridostigmine (MESTINON) 60 MG tablet Take 0.5 tablets (30 mg total) by mouth 3 (three) times daily.  60 tablet  3  . simvastatin (ZOCOR) 80 MG tablet TAKE 1 TABLET (80 MG TOTAL) BY MOUTH DAILY.  90 tablet  2   No current facility-administered medications on file prior to visit.    ALLERGIES: No Known Allergies  FAMILY HISTORY: Family History  Problem Relation Age of Onset  . Diabetes Brother   . Colon cancer Neg Hx   . Prostate cancer Neg Hx   . Stroke Neg Hx   . CAD Neg Hx     SOCIAL HISTORY: History   Social History  . Marital Status: Married    Spouse Name: N/A    Number of Children: 2  . Years of Education: N/A   Occupational History  . 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 Smoking status: Former Games developer  . Smokeless tobacco: Never Used     Comment: Quit 46 yrs ago  . Alcohol Use: No     Comment: none  . Drug Use: No  . Sexual Activity: Not on file   Other Topics Concern  . Not on file   Social History Narrative   married,  2 living children, lost 2 kids      REVIEW OF SYSTEMS: Constitutional: No fevers, chills, or sweats, no generalized fatigue, change in appetite Eyes: No visual changes, double vision, eye pain Ear, nose and throat: No hearing loss, ear pain, nasal congestion, sore throat Cardiovascular: No chest pain, palpitations Respiratory:  No shortness of breath at rest or with exertion, wheezes GastrointestinaI: No nausea, vomiting, diarrhea, abdominal pain, fecal incontinence Genitourinary:  No dysuria, urinary retention or frequency Musculoskeletal:  No neck pain, back pain Integumentary: No rash, pruritus, skin lesions Neurological: as above Psychiatric: No depression, insomnia, anxiety Endocrine: No palpitations, fatigue, diaphoresis, mood swings, change in appetite, change in weight, increased  thirst Hematologic/Lymphatic:  No anemia, purpura, petechiae. Allergic/Immunologic: no itchy/runny eyes, nasal congestion, recent allergic reactions, rashes  PHYSICAL EXAM: Filed Vitals:   08/13/13 1500  BP: 130/70  Pulse: 62  Temp: 98.1 F (36.7 C)   General: No acute distress Head:  Normocephalic/atraumatic Neck: supple, no paraspinal tenderness, full range of motion Heart:  Regular rate and rhythm Lungs:  Clear to auscultation bilaterally Back: No paraspinal tenderness Neurological Exam: alert and oriented to person, place, and time. Speech fluent and not dysarthric, language intact.  Left ptosis (unable to forcibly open eye), otherwise CN II-XII intact. Fundoscopic exam unremarkable, no papilledema.  Bulk and tone normal, muscle strength 5/5 throughout, including neck flexors/extensors.  Sensation to light touch intact.  Deep tendon reflexes absent throughout.  Finger to nose intact.  Gait normal, Romberg negative.  IMPRESSION: Ocular myasthenia.  Stable, but patient is hesitant about taking mestinon due to side effects.  Overall, he feels he is having less diarrhea and is willing to try and slowly titrate up on the Mestinon.  PLAN: 1.  Increase mestinon to 30mg  qAM, 60mg  qNOON, and 30mg  qHS.  Call in 2 days to inform us if he notes significant improvement after the 60mg  dose.  If no significant improvement, may need to consider starting low-dose prednisone 10mg .  Discussed side effects. 2.  Follow up in 3 months.  Call with questions or concerns.  30 minute spent with patient, over 50% spent counseling and coordinating care.  Shon Millet, DO  CC:  Willow Ora, MD

## 2013-08-22 ENCOUNTER — Telehealth: Payer: Self-pay | Admitting: Neurology

## 2013-08-22 NOTE — Telephone Encounter (Signed)
Patient left a message on my voicemail stating he was not able to tolerate the Mestinon increase at the lunch time dose; he tried but just can't tolerate; can do the 1/2 tab TID though. Asked that I let Dr. Everlena Cooper know. **Dr. Everlena Cooper, [place] [facility].

## 2013-08-26 ENCOUNTER — Telehealth: Payer: Self-pay | Admitting: Neurology

## 2013-08-26 ENCOUNTER — Other Ambulatory Visit: Payer: Self-pay | Admitting: Neurology

## 2013-08-26 MED ORDER — PREDNISONE 10 MG PO TABS: 10.0000 mg | ORAL_TABLET | Freq: Every day | ORAL | Status: DC

## 2013-08-26 NOTE — Telephone Encounter (Signed)
Yes.  I would start prednisone 10mg  daily.

## 2013-08-26 NOTE — Telephone Encounter (Signed)
Spoke with the patient. Will e-scribe Prednisone 10 mg daily. Asked the patient to call if problems or concerns. He states that he will.

## 2013-08-26 NOTE — Telephone Encounter (Signed)
Received a call from patient yesterday at 2:05 pm re: starting another medication Dr. Everlena Cooper mentioned when he was last seen.  **Dr. Everlena Cooper, last office note mentions low dose Prednisone 10 mg. Want to start? Please advise. Thank you.

## 2013-11-12 ENCOUNTER — Ambulatory Visit (INDEPENDENT_AMBULATORY_CARE_PROVIDER_SITE_OTHER): Payer: Medicare Other | Admitting: Neurology

## 2013-11-12 ENCOUNTER — Encounter: Payer: Self-pay | Admitting: Neurology

## 2013-11-12 VITALS — BP 144/62 | HR 64 | Resp 18 | Ht 72.0 in | Wt 284.0 lb

## 2013-11-12 DIAGNOSIS — G7000 Myasthenia gravis without (acute) exacerbation: Secondary | ICD-10-CM

## 2013-11-12 MED ORDER — PREDNISONE 10 MG PO TABS: 20.0000 mg | ORAL_TABLET | Freq: Every day | ORAL | Status: DC

## 2013-11-12 NOTE — Patient Instructions (Signed)
Your eye looks better than at our last meeting. 1.  We will increase the prednisone to 2 tablets daily (20mg  daily) 2.  Stop the pyridostigmine 3.  Follow up in 3 months but call with questions or concerns.

## 2013-11-12 NOTE — Progress Notes (Signed)
NEUROLOGY FOLLOW UP OFFICE NOTE  [name] [name] [id number]  HISTORY OF PRESENT ILLNESS: [name] [name] is a 73 year old man with history of palpitations, bradycardia, and high blood pressure who returns to follow up for ocular myasthenia.  Records and images were personally reviewed where available.    He developed left eyelid ptosis after knee surgery last November.  He denied any blurred vision or diplopia.  Ach binding antibodies were 19.4 and modulating antibodies were 86.  CT of chest revealed no abnormalities of thymus.  TSH 0.38.  He was started on Mestinon and developed loose stool on dose of 60mg  BID.  He has history of bradycardia and first degree heart black.  Palpitations worsened when started on Mestinon.  Dose was reduced to 30mg  BID and then once daily, which did improve his symptoms.  His cardiologist, Dr. Acie Fredrickson, discontinued the carvedilol to see if this contributed to the bradycardia.  He still was experiencing diarrhea on low-dose Mestinon, so we started prednisone 10mg  daily.  His heart has been doing well.  He has noticed some improvement in his eye.  He denies worsening shortness of breath, dysphagia, neck weakness or limb weakness.   PAST MEDICAL HISTORY: Past Medical History  Diagnosis Date  . Dysrhythmia   . High blood pressure   . MG, ocular (myasthenia gravis)     MEDICATIONS: Current Outpatient Prescriptions on File Prior to Visit  Medication Sig Dispense Refill  . aspirin 81 MG tablet Take 81 mg by mouth daily.      . fish oil-omega-3 fatty acids 1000 MG capsule Take 1 g by mouth daily.      Marland Kitchen losartan (COZAAR) 100 MG tablet Take 1 tablet (100 mg total) by mouth daily.  90 tablet  1  . simvastatin (ZOCOR) 80 MG tablet TAKE 1 TABLET (80 MG TOTAL) BY MOUTH DAILY.  90 tablet  2   No current facility-administered medications on file prior to visit.    ALLERGIES: No Known Allergies  FAMILY HISTORY: Family History  Problem Relation Age of Onset  . Diabetes  Brother   . Colon cancer Neg Hx   . Prostate cancer Neg Hx   . Stroke Neg Hx   . CAD Neg Hx     SOCIAL HISTORY: History   Social History  . Marital Status: Married    Spouse Name: N/A    Number of Children: 2  . Years of Education: N/A   Occupational History  . [employer]-- retired 2010    Social History Main Topics  . Smoking status: Former Research scientist (life sciences)  . Smokeless tobacco: Never Used     Comment: Quit 46 yrs ago  . Alcohol Use: No     Comment: none  . Drug Use: No  . Sexual Activity: Not on file   Other Topics Concern  . Not on file   Social History Narrative   married, 2 living children, lost 2 kids      REVIEW OF SYSTEMS: Constitutional: No fevers, chills, or sweats, no generalized fatigue, change in appetite Eyes: No visual changes, double vision, eye pain Ear, nose and throat: No hearing loss, ear pain, nasal congestion, sore throat Cardiovascular: No chest pain, palpitations Respiratory:  No shortness of breath at rest or with exertion, wheezes GastrointestinaI: No nausea, vomiting, diarrhea, abdominal pain, fecal incontinence Genitourinary:  No dysuria, urinary retention or frequency Musculoskeletal:  No neck pain, back pain Integumentary: No rash, pruritus, skin lesions Neurological: as above Psychiatric: No depression, insomnia,  anxiety Endocrine: No palpitations, fatigue, diaphoresis, mood swings, change in appetite, change in weight, increased thirst Hematologic/Lymphatic:  No anemia, purpura, petechiae. Allergic/Immunologic: no itchy/runny eyes, nasal congestion, recent allergic reactions, rashes  PHYSICAL EXAM: Filed Vitals:   11/12/13 1437  BP: 144/62  Pulse: 64  Resp: 18   General: No acute distress Head:  Normocephalic/atraumatic Neck: supple, no paraspinal tenderness, full range of motion Heart:  Regular rate and rhythm Lungs:  Clear to auscultation bilaterally Back: No paraspinal tenderness Neurological Exam: alert and oriented to  person, place, and time. Speech fluent and not dysarthric, language intact.  Left ptosis, but eye open and gives resistance when I forcibly try to open his lid.  Otherwise, CN II-XII intact. .  Bulk and tone normal, muscle strength 5/5 throughout.  Neck flexion and extension strength intact.  Sensation to light touch intact.  Deep tendon reflexes 2+ throughout.  Finger to nose testing intact.  Gait normal, Romberg negative.  IMPRESSION: Left ocular myasthenia.  Noted improvement (less of a ptosis)  PLAN: 1.  Increase prednisone to 20mg  daily 2.  Discontinue pyridostigmine as low dose likely not effective. 3.  Follow up in 3 months or as needed.  Metta Clines, DO  CC:  [name], MD

## 2013-11-18 ENCOUNTER — Other Ambulatory Visit: Payer: Self-pay | Admitting: Internal Medicine

## 2013-11-18 NOTE — Telephone Encounter (Signed)
Simvastatin refilled per protocol. JG//CMA

## 2013-11-19 ENCOUNTER — Encounter: Payer: Self-pay | Admitting: Cardiovascular Disease

## 2013-11-19 ENCOUNTER — Ambulatory Visit (INDEPENDENT_AMBULATORY_CARE_PROVIDER_SITE_OTHER): Payer: Medicare Other | Admitting: Cardiovascular Disease

## 2013-11-19 VITALS — BP 135/65 | HR 51 | Ht 72.0 in | Wt 283.0 lb

## 2013-11-19 DIAGNOSIS — I10 Essential (primary) hypertension: Secondary | ICD-10-CM

## 2013-11-19 DIAGNOSIS — R001 Bradycardia, unspecified: Secondary | ICD-10-CM

## 2013-11-19 DIAGNOSIS — I498 Other specified cardiac arrhythmias: Secondary | ICD-10-CM

## 2013-11-19 NOTE — Patient Instructions (Signed)
Your physician wants you to follow-up in: 1 YEAR.  You will receive a reminder letter in the mail two months in advance. If you don't receive a letter, please call our office to schedule the follow-up appointment.  Your physician recommends that you continue on your current medications as directed. Please refer to the Current Medication list given to you today.  

## 2013-11-19 NOTE — Assessment & Plan Note (Signed)
His heart rate remains low. We will continue to follow him. At this point there is no indication for pacemaker. I'll see him again in one year.

## 2013-11-19 NOTE — Progress Notes (Signed)
[name] [name] Date of Birth  Mar 09, 1941       [name] [address], [place], [place]  [address]   [name], [place]  [address] [phone]     [phone]   Fax  [phone]    Fax [phone]  Problem List: 1. Palpitations 2. myasthemia gravis ( newly diagnosed)  3. Bradycardia 4. Hypertension  History of Present Illness:  [name] [name] is a 73 yo with hx of palpitations ( previously saw Dr. Aldona Bar) .  He has had a slow HR for years.  He was diagnosed with myasthenia 6 months ago and was started on Mestinon.  His palpitations worsened.  He has tapered his mestinon and his symptoms are better.    He is a retired Investment banker, corporate.   Jan. 28, 2015:  [name] [name] is feeling better.  He has started his exercise echo. He's been walking a little bit more.  He is off the mestinon now.  He does not know whether or not he actually has myasthemia Gravis.   No syncope or presyncope. He is able to do all of his normal activities without significant problems.  His medical Dr. has stopped his carvedilol to his heart rate remains low.  Current Outpatient Prescriptions on File Prior to Visit  Medication Sig Dispense Refill  . aspirin 81 MG tablet Take 81 mg by mouth daily.      . fish oil-omega-3 fatty acids 1000 MG capsule Take 1 g by mouth daily.      Marland Kitchen losartan (COZAAR) 100 MG tablet Take 1 tablet (100 mg total) by mouth daily.  90 tablet  1  . predniSONE (DELTASONE) 10 MG tablet Take 2 tablets (20 mg total) by mouth daily with breakfast.  60 tablet  2  . simvastatin (ZOCOR) 80 MG tablet TAKE 1 TABLET (80 MG TOTAL) BY MOUTH DAILY.  90 tablet  1   No current facility-administered medications on file prior to visit.    No Known Allergies  Past Medical History  Diagnosis Date  . Dysrhythmia   . High blood pressure   . MG, ocular (myasthenia gravis)     Past Surgical History  Procedure Laterality Date  .  Hernia repair      bilateral inguinal hernia  . Hernia repair      umbilical  . Joint replacement  2009    right knee  . 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 Middle ear surgery Right     ear -patched hole in ear drum    History  Smoking status  . Former Smoker  Smokeless tobacco  . Never Used    Comment: Quit 46 yrs ago    History  Alcohol Use No    Comment: none    Family History  Problem Relation Age of Onset  . Diabetes Brother   . Colon cancer Neg Hx   . Prostate cancer Neg Hx   . Stroke Neg Hx   . CAD Neg Hx     Reviw of Systems:  Reviewed in the HPI.  All other systems are negative.  Physical Exam: Blood pressure 135/65, pulse 51, height 6' (1.829 m), weight 283 lb (128.368 kg). General: Well developed, well nourished, in no acute distress. Head: Normocephalic, atraumatic,  sclera non-icteric, mucus membranes are moist,  Neck: Supple. Carotids are 2 + without bruits. No JVD  Lungs: Clear  Heart: RR, normal S1, S2 Abdomen: Soft, non-tender, non-distended with normal bowel sounds. Msk:  Strength and tone are normal  Extremities: No clubbing or cyanosis. No edema.  Distal pedal pulses are 2+ and equal Neuro: CN II - XII intact.  Alert and oriented X 3.  Psych:  Normal   ECG: May 22, 2013:  Sinus brady at 92 with 1st degree AV block.  Low voltage QR.  Assessment / Plan:

## 2013-11-19 NOTE — Assessment & Plan Note (Signed)
His blood pressure remained well controlled. He's no longer on the carvedilol because of the slow heart rate. His has not changed his heart rate significant. He's not having any episodes of syncope.

## 2014-01-07 ENCOUNTER — Other Ambulatory Visit: Payer: Self-pay | Admitting: Cardiovascular Disease

## 2014-01-30 ENCOUNTER — Telehealth: Payer: Self-pay

## 2014-01-30 NOTE — Telephone Encounter (Signed)
Patient called to return your phone back.

## 2014-01-30 NOTE — Telephone Encounter (Addendum)
Left message for call back Non identifiable  Medication List and allergies:  Reviewed and updated  90 day supply/mail order: na Local prescriptions: [facility] [address] [place]  Immunizations due: UTD  A/P:   No changes to FH, PSH or Personal Hx Flu vaccine--07/2013 Tdap--11/2007 Shingles vaccine--2013 PNA--10/2008 CCS--2013--Dr Buccini--sessile polyp--next due 2018 PSA--11/2011--1.75  To Discuss with Provider: Not at this time

## 2014-02-02 ENCOUNTER — Encounter: Payer: Self-pay | Admitting: Internal Medicine

## 2014-02-02 ENCOUNTER — Ambulatory Visit (INDEPENDENT_AMBULATORY_CARE_PROVIDER_SITE_OTHER): Payer: Medicare Other | Admitting: Internal Medicine

## 2014-02-02 VITALS — BP 119/72 | HR 60 | Temp 97.9°F | Ht 71.1 in | Wt 286.0 lb

## 2014-02-02 DIAGNOSIS — I10 Essential (primary) hypertension: Secondary | ICD-10-CM

## 2014-02-02 DIAGNOSIS — Z125 Encounter for screening for malignant neoplasm of prostate: Secondary | ICD-10-CM

## 2014-02-02 DIAGNOSIS — E785 Hyperlipidemia, unspecified: Secondary | ICD-10-CM

## 2014-02-02 DIAGNOSIS — G7000 Myasthenia gravis without (acute) exacerbation: Secondary | ICD-10-CM

## 2014-02-02 DIAGNOSIS — Z0000 Encounter for general adult medical examination without abnormal findings: Secondary | ICD-10-CM

## 2014-02-02 LAB — CBC WITH DIFFERENTIAL/PLATELET
BASOS PCT: 0.5 % (ref 0.0–3.0)
Basophils Absolute: 0 10*3/uL (ref 0.0–0.1)
EOS ABS: 0.1 10*3/uL (ref 0.0–0.7)
EOS PCT: 1.5 % (ref 0.0–5.0)
HCT: 40.6 % (ref 39.0–52.0)
HEMOGLOBIN: 13.7 g/dL (ref 13.0–17.0)
LYMPHS PCT: 23.4 % (ref 12.0–46.0)
Lymphs Abs: 1.8 10*3/uL (ref 0.7–4.0)
MCHC: 33.8 g/dL (ref 30.0–36.0)
MCV: 92.8 fl (ref 78.0–100.0)
Monocytes Absolute: 0.7 10*3/uL (ref 0.1–1.0)
Monocytes Relative: 8.6 % (ref 3.0–12.0)
NEUTROS ABS: 5.2 10*3/uL (ref 1.4–7.7)
Neutrophils Relative %: 66 % (ref 43.0–77.0)
Platelets: 234 10*3/uL (ref 150.0–400.0)
RBC: 4.38 Mil/uL (ref 4.22–5.81)
RDW: 12.7 % (ref 11.5–14.6)
WBC: 7.9 10*3/uL (ref 4.5–10.5)

## 2014-02-02 LAB — LIPID PANEL
CHOL/HDL RATIO: 3
Cholesterol: 162 mg/dL (ref 0–200)
HDL: 46.7 mg/dL (ref >39.00)
LDL CALC: 60 mg/dL (ref 0–99)
Triglycerides: 278 mg/dL — ABNORMAL HIGH (ref 0.0–149.0)
VLDL: 55.6 mg/dL — AB (ref 0.0–40.0)

## 2014-02-02 LAB — COMPREHENSIVE METABOLIC PANEL
ALK PHOS: 60 U/L (ref 39–117)
ALT: 19 U/L (ref 0–53)
AST: 15 U/L (ref 0–37)
Albumin: 3.9 g/dL (ref 3.5–5.2)
BUN: 11 mg/dL (ref 6–23)
CALCIUM: 9.2 mg/dL (ref 8.4–10.5)
CO2: 28 mEq/L (ref 19–32)
CREATININE: 1 mg/dL (ref 0.4–1.5)
Chloride: 102 mEq/L (ref 96–112)
GFR: 80.96 mL/min (ref >60.00)
Glucose, Bld: 86 mg/dL (ref 70–99)
Potassium: 4.2 mEq/L (ref 3.5–5.1)
Sodium: 139 mEq/L (ref 135–145)
Total Bilirubin: 0.7 mg/dL (ref 0.3–1.2)
Total Protein: 6.4 g/dL (ref 6.0–8.3)

## 2014-02-02 LAB — PSA: PSA: 2.57 ng/mL (ref 0.10–4.00)

## 2014-02-02 NOTE — Assessment & Plan Note (Addendum)
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 DRE normal today encouraged to continue exercise , recommend weight loss, diet discussed

## 2014-02-02 NOTE — Progress Notes (Signed)
Subjective:    Patient ID: [name] [name], male    DOB: [birth year], 73 y.o.   MRN: [id number]  DOS:  02/02/2014 Type of  visit:  Here for Medicare AWV: 1. Risk factors based on Past M, S, F history: reviewed   2. Physical Activities: walks 1.5 miles 3/week, bike x 20 min 3 times a week 3. Depression/mood: neg screening 4.  Hearing: R hearing decreased? Sx stable. No tinnitus, declined referral   5. ADL's: independent   6. Fall Risk: no recent fall , see instructions   7. Home Safety: does feel safe at home   8.  Height, weight, &visual acuity: see VS, vision no problems noted or reported, has not d\seen eye doctor in a while 9.  Counseling: yes   10.   Labs ordered based on risk factors: yes    11. Referral Coordination, if needed   12.  Care Plan, see a/p 13.  Cognitive Assessment: cognition, memory and motor skills seem appropiate  in addition, we discussed the following CV, saw cardiology 3 months ago, stable. Myasthenia gravis, still taking prednisone although he reports   was recommended to stop. Hyperlipidemia, good medication compliance. Hypertension, good medication compliance, BP today is very good.  ROS No  CP, SOB No palpitations, no lower extremity edema Denies  nausea, vomiting diarrhea Denies  blood in the stools (-) cough, sputum production (-) wheezing, chest congestion  No dysuria, gross hematuria, difficulty urinating   no headaches Denies diplopia  Denies dizziness    Past Medical History  Diagnosis Date  . Dysrhythmia   . High blood pressure   . MG, ocular (myasthenia gravis)     ? of     Past Surgical History  Procedure Laterality Date  . Hernia repair      bilateral inguinal hernia  . Hernia repair      umbilical  . Joint replacement  2009    right knee  . 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 Middle ear  surgery Right     ear -patched hole in ear drum    History   Social History  . Marital Status: Married    Spouse Name: N/A    Number of Children: 4  . Years of Education: N/A   Occupational History  . [employer]-- retired 2010    Social History Main Topics  . Smoking status: Former Research scientist (life sciences)  . Smokeless tobacco: Never Used     Comment: Quit 46 yrs ago  . Alcohol Use: No     Comment: none  . Drug Use: No  . Sexual Activity: Not on file   Other Topics Concern  . Not on file   Social History Narrative   married, lives w/ second wife, 2 living children, lost 2 kids       Family History  Problem Relation Age of Onset  . Diabetes Brother   . Colon cancer Neg Hx   . Prostate cancer Neg Hx   . Stroke Neg Hx   . CAD Neg Hx        Medication List       This list is accurate as of: 02/02/14  5:27 PM.  Always use your most recent med list.               aspirin 81 MG tablet  Take  81 mg by mouth daily.     fish oil-omega-3 fatty acids 1000 MG capsule  Take 1 g by mouth daily.     losartan 100 MG tablet  Commonly known as:  COZAAR  TAKE 1 TABLET (100 MG TOTAL) BY MOUTH DAILY.     predniSONE 10 MG tablet  Commonly known as:  DELTASONE  Take 2 tablets (20 mg total) by mouth daily with breakfast.     simvastatin 80 MG tablet  Commonly known as:  ZOCOR  TAKE 1 TABLET (80 MG TOTAL) BY MOUTH DAILY.           Objective:   Physical Exam BP 119/72  Pulse 60  Temp(Src) 97.9 F (36.6 C)  Ht 5' 11.1" (1.806 m)  Wt 286 lb (129.729 kg)  BMI 39.77 kg/m2  SpO2 97%  General -- alert, well-developed, NAD.  Neck --no thyromegaly , normal carotid pulse  HEENT-- Not pale.  Lungs -- normal respiratory effort, no intercostal retractions, no accessory muscle use, and normal breath sounds.  Heart-- normal rate, regular rhythm, no murmur.  Abdomen-- Not distended, good bowel sounds,soft, non-tender. No bruit  Rectal-- No external abnormalities noted. Normal  sphincter tone. No rectal masses or tenderness. Brown stool  Prostate--Prostate gland firm and smooth, no enlargement, nodularity, tenderness, mass, asymmetry or induration. Extremities-- no pretibial edema bilaterally  Neurologic--  alert & oriented X3. Speech normal, gait normal, strength normal in all extremities. L ptosis, mild    Psych-- Cognition and judgment appear intact. Cooperative with normal attention span and concentration. No anxious or depressed appearing.     Assessment & Plan:

## 2014-02-02 NOTE — Assessment & Plan Note (Addendum)
Currently on prednisone, 20 mg daily. Patient stated that he was told to discontinue prednisone but restarted on its own. Plan: Decrease prednisone to 15 mg for 2 weeks then 10 mg. rec to discuss further decrease with neurology

## 2014-02-02 NOTE — Assessment & Plan Note (Signed)
Seems  well-controlled, labs 

## 2014-02-02 NOTE — Assessment & Plan Note (Signed)
Good medication compliance, labs

## 2014-02-02 NOTE — Patient Instructions (Addendum)
Get your blood work before you leave   Consider take  a immunization called PREVNAR  If you need more information about a healthy diet  visit  the American Heart Association, it  is a great resource online at:  http://www.richard-flynn.net/  Decrease prednisone to 15  mg a day for 2 weeks, then 10 mg a day.  Next visit is for routine check up regards your  blood pressure  in 6 months  No need to come back fasting Please make an appointment     Fall Prevention and Home Safety Falls cause injuries and can affect all age groups. It is possible to use preventive measures to significantly decrease the likelihood of falls. There are many simple measures which can make your home safer and prevent falls. OUTDOORS  Repair cracks and edges of walkways and driveways.  Remove high doorway thresholds.  Trim shrubbery on the main path into your home.  Have good outside lighting.  Clear walkways of tools, rocks, debris, and clutter.  Check that handrails are not broken and are securely fastened. Both sides of steps should have handrails.  Have leaves, snow, and ice cleared regularly.  Use sand or salt on walkways during winter months.  In the garage, clean up grease or oil spills. BATHROOM  Install night lights.  Install grab bars by the toilet and in the tub and shower.  Use non-skid mats or decals in the tub or shower.  Place a plastic non-slip stool in the shower to sit on, if needed.  Keep floors dry and clean up all water on the floor immediately.  Remove soap buildup in the tub or shower on a regular basis.  Secure bath mats with non-slip, double-sided rug tape.  Remove throw rugs and tripping hazards from the floors. BEDROOMS  Install night lights.  Make sure a bedside light is easy to reach.  Do not use oversized bedding.  Keep a telephone by your bedside.  Have a firm chair with side arms to use for getting dressed.  Remove throw rugs and tripping hazards  from the floor. KITCHEN  Keep handles on pots and pans turned toward the center of the stove. Use back burners when possible.  Clean up spills quickly and allow time for drying.  Avoid walking on wet floors.  Avoid hot utensils and knives.  Position shelves so they are not too high or low.  Place commonly used objects within easy reach.  If necessary, use a sturdy step stool with a grab bar when reaching.  Keep electrical cables out of the way.  Do not use floor polish or wax that makes floors slippery. If you must use wax, use non-skid floor wax.  Remove throw rugs and tripping hazards from the floor. STAIRWAYS  Never leave objects on stairs.  Place handrails on both sides of stairways and use them. Fix any loose handrails. Make sure handrails on both sides of the stairways are as long as the stairs.  Check carpeting to make sure it is firmly attached along stairs. Make repairs to worn or loose carpet promptly.  Avoid placing throw rugs at the top or bottom of stairways, or properly secure the rug with carpet tape to prevent slippage. Get rid of throw rugs, if possible.  Have an electrician put in a light switch at the top and bottom of the stairs. OTHER FALL PREVENTION TIPS  Wear low-heel or rubber-soled shoes that are supportive and fit well. Wear closed toe shoes.  When using  a stepladder, make sure it is fully opened and both spreaders are firmly locked. Do not climb a closed stepladder.  Add color or contrast paint or tape to grab bars and handrails in your home. Place contrasting color strips on first and last steps.  Learn and use mobility aids as needed. Install an electrical emergency response system.  Turn on lights to avoid dark areas. Replace light bulbs that burn out immediately. Get light switches that glow.  Arrange furniture to create clear pathways. Keep furniture in the same place.  Firmly attach carpet with non-skid or double-sided tape.  Eliminate  uneven floor surfaces.  Select a carpet pattern that does not visually hide the edge of steps.  Be aware of all pets. OTHER HOME SAFETY TIPS  Set the water temperature for 120 F (48.8 C).  Keep emergency numbers on or near the telephone.  Keep smoke detectors on every level of the home and near sleeping areas. Document Released: 09/29/2002 Document Revised: 04/09/2012 Document Reviewed: 12/29/2011 [facility] Patient Information 2014 [place].

## 2014-02-03 ENCOUNTER — Telehealth: Payer: Self-pay | Admitting: Internal Medicine

## 2014-02-03 NOTE — Telephone Encounter (Signed)
Relevant patient education mailed to patient.  

## 2014-02-18 ENCOUNTER — Encounter: Payer: Self-pay | Admitting: Neurology

## 2014-02-18 ENCOUNTER — Ambulatory Visit (INDEPENDENT_AMBULATORY_CARE_PROVIDER_SITE_OTHER): Payer: Medicare Other | Admitting: Neurology

## 2014-02-18 VITALS — BP 154/78 | HR 60 | Resp 18 | Ht 72.0 in | Wt 291.0 lb

## 2014-02-18 DIAGNOSIS — G7000 Myasthenia gravis without (acute) exacerbation: Secondary | ICD-10-CM

## 2014-02-18 MED ORDER — OMEPRAZOLE 20 MG PO CPDR: 20.0000 mg | DELAYED_RELEASE_CAPSULE | Freq: Every day | ORAL | Status: DC

## 2014-02-18 NOTE — Patient Instructions (Addendum)
1.  Go back to taking prednisone 20mg  (total of two 10mg  tablets) once a day. 2.  Will prescribe omeprazole 20mg  daily for stomach protection. 3.  Stop the Mestinon. 4.  Follow up in 3 months.

## 2014-02-18 NOTE — Progress Notes (Signed)
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  Records and images were personally reviewed where available.    UPDATE: Last visit, we increased the prednisone to 20mg  daily.  There seemed to have been a miscommunication regarding his medications.  He was instructed to start tapering off the prednisone, as that was thought to be my plan.  He is currently only taking 10mg  daily.  He also decided to take 1/2 a Mestinon.  He reports some heartburn with the prednisone but overall is doing well.  He is able to drive and the eye doesn't shut as much but will close when he is tired.  He denies worsening shortness of breath, dysphagia, neck weakness or limb weakness.  02/02/14:  Glucose 86.  HISTORY: He developed left eyelid ptosis after knee surgery last November.  He denied any blurred vision or diplopia.  Ach binding antibodies were 19.4 and modulating antibodies were 86.  CT of chest revealed no abnormalities of thymus.  TSH 0.38.  He was started on Mestinon and developed loose stool on dose of 60mg  BID.  He has history of bradycardia and first degree heart black.  Palpitations worsened when started on Mestinon.  Dose was reduced to 30mg  BID and then once daily, which did improve his symptoms.  His cardiologist, Dr. Acie Fredrickson, discontinued the carvedilol to see if this contributed to the bradycardia.  He still was experiencing diarrhea on low-dose Mestinon, so it was discontinued and he was started on prednisone.   PAST MEDICAL HISTORY: Past Medical History  Diagnosis Date  . Dysrhythmia   . High blood pressure   . MG, ocular (myasthenia gravis)     ? of     MEDICATIONS: Current Outpatient Prescriptions on File Prior to Visit  Medication Sig Dispense Refill  . aspirin 81 MG tablet Take 81 mg by mouth daily.       . fish oil-omega-3 fatty acids 1000 MG capsule Take 1 g by mouth daily.      Marland Kitchen losartan (COZAAR) 100 MG tablet TAKE 1 TABLET (100 MG TOTAL) BY MOUTH DAILY.  90 tablet  1  . simvastatin (ZOCOR) 80 MG tablet TAKE 1 TABLET (80 MG TOTAL) BY MOUTH DAILY.  90 tablet  1   No current facility-administered medications on file prior to visit.    ALLERGIES: No Known Allergies  FAMILY HISTORY: Family History  Problem Relation Age of Onset  . Diabetes Brother   . Colon cancer Neg Hx   . Prostate cancer Neg Hx   . Stroke Neg Hx   . CAD Neg Hx     SOCIAL HISTORY: History   Social History  . Marital Status: Married    Spouse Name: N/A    Number of Children: 4  . Years of Education: N/A   Occupational History  . [employer]-- retired 2010    Social History Main Topics  . Smoking status: Former Research scientist (life sciences)  . Smokeless tobacco: Never Used     Comment: Quit 46 yrs ago  . Alcohol Use: No     Comment: none  . Drug Use: No  . Sexual Activity: Not on file   Other Topics Concern  . Not on file   Social History Narrative   married, lives w/ second wife, 2 living children, lost 2 kids  REVIEW OF SYSTEMS: Constitutional: No fevers, chills, or sweats, no generalized fatigue, change in appetite Eyes: No visual changes, double vision, eye pain Ear, nose and throat: No hearing loss, ear pain, nasal congestion, sore throat Cardiovascular: No chest pain, palpitations Respiratory:  No shortness of breath at rest or with exertion, wheezes GastrointestinaI: No nausea, vomiting, diarrhea, abdominal pain, fecal incontinence Genitourinary:  No dysuria, urinary retention or frequency Musculoskeletal:  No neck pain, back pain Integumentary: No rash, pruritus, skin lesions Neurological: as above Psychiatric: No depression, insomnia, anxiety Endocrine: No palpitations, fatigue, diaphoresis, mood swings, change in appetite, change in weight, increased thirst Hematologic/Lymphatic:  No  anemia, purpura, petechiae. Allergic/Immunologic: no itchy/runny eyes, nasal congestion, recent allergic reactions, rashes  PHYSICAL EXAM: Filed Vitals:   02/18/14 1342  BP: 154/78  Pulse: 60  Resp: 18   General: No acute distress Head:  Normocephalic/atraumatic Neck: supple, no paraspinal tenderness, full range of motion Heart:  Regular rate and rhythm Lungs:  Clear to auscultation bilaterally Back: No paraspinal tenderness Neurological Exam: alert and oriented to person, place, and time. Attention span and concentration intact, recent and remote memory intact, fund of knowledge intact.  Speech fluent and not dysarthric, language intact. Left ptosis, but eye open and gives resistance when I forcibly try to open his lid. Otherwise, CN II-XII intact. . Bulk and tone normal, muscle strength 5/5 throughout. Neck flexion and extension strength intact. Sensation to light touch intact. Deep tendon reflexes 2+ throughout. Finger to nose testing intact. Gait normal, Romberg negative.  IMPRESSION: Ocular myasthenia   PLAN: 1.  Will increase prednisone back up to 20mg  daily 2.  Will prescribe omeprazole 20mg  daily for GI protection. 3.  Advised to stop Mestinon as such a low dose is ineffective anyway. 4.  Follow up in 3 months.  Metta Clines, DO  CC:  [name], MD

## 2014-03-26 ENCOUNTER — Telehealth: Payer: Self-pay | Admitting: *Deleted

## 2014-03-26 NOTE — Telephone Encounter (Signed)
Message copied by Claudie Revering on Thu Mar 26, 2014  8:17 AM ------      Message from: JAFFE, ADAM R      Created: Thu Mar 26, 2014  7:45 AM       Daine Floras,            Since Mr. Festa is taking the prednisone, I want to make sure that he takes supplemental over-the-counter calcium in addition to the omeprazole, as long-term use of prednisone may cause osteopenia.  Also, his PCP should be monitoring his blood sugar, since it may cause increased blood sugar. ------

## 2014-03-26 NOTE — Telephone Encounter (Signed)
Patient was advised to get a good calcium supplemental and to discuss with his pcp about the monitoring of his blood sugar

## 2014-05-16 ENCOUNTER — Other Ambulatory Visit: Payer: Self-pay | Admitting: Internal Medicine

## 2014-05-20 ENCOUNTER — Encounter: Payer: Self-pay | Admitting: Neurology

## 2014-05-20 ENCOUNTER — Ambulatory Visit (INDEPENDENT_AMBULATORY_CARE_PROVIDER_SITE_OTHER): Payer: Medicare Other | Admitting: Neurology

## 2014-05-20 VITALS — BP 148/80 | HR 58 | Ht 70.87 in | Wt 287.2 lb

## 2014-05-20 DIAGNOSIS — G7000 Myasthenia gravis without (acute) exacerbation: Secondary | ICD-10-CM

## 2014-05-20 MED ORDER — PREDNISONE 10 MG PO TABS: 15.0000 mg | ORAL_TABLET | Freq: Every day | ORAL | Status: DC

## 2014-05-20 NOTE — Progress Notes (Signed)
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  He is accompanied by his wife.  UPDATE: Currently taking:  Prednisone 15mg  daily.  Higher doses upset his stomach, despite omeprazole.  He is doing well.  Denies double vision, difficulty breathing, difficulty swallowing or head droop.  Since starting the prednisone, he seems to sweat more during the summer.  02/02/14:  Glucose 86.  HISTORY: He developed left eyelid ptosis after knee surgery last November.  He denied any blurred vision or diplopia.  Ach binding antibodies were 19.4 and modulating antibodies were 86.  CT of chest revealed no abnormalities of thymus.  TSH 0.38.  He was started on Mestinon and developed loose stool on dose of 60mg  BID.  He has history of bradycardia and first degree heart black.  Palpitations worsened when started on Mestinon.  Dose was reduced to 30mg  BID and then once daily, which did improve his symptoms.  His cardiologist, Dr. Acie Fredrickson, discontinued the carvedilol to see if this contributed to the bradycardia.  He still was experiencing diarrhea on low-dose Mestinon, so it was discontinued and he was started on prednisone.  PAST MEDICAL HISTORY: Past Medical History  Diagnosis Date  . Dysrhythmia   . High blood pressure   . MG, ocular (myasthenia gravis)     ? of     MEDICATIONS: Current Outpatient Prescriptions on File Prior to Visit  Medication Sig Dispense Refill  . aspirin 81 MG tablet Take 81 mg by mouth daily.      . fish oil-omega-3 fatty acids 1000 MG capsule Take 1 g by mouth daily.      Marland Kitchen losartan (COZAAR) 100 MG tablet TAKE 1 TABLET (100 MG TOTAL) BY MOUTH DAILY.  90 tablet  1  . simvastatin (ZOCOR) 80 MG tablet TAKE 1 TABLET (80 MG TOTAL) BY MOUTH DAILY.  90 tablet  0   No current facility-administered  medications on file prior to visit.    ALLERGIES: No Known Allergies  FAMILY HISTORY: Family History  Problem Relation Age of Onset  . Diabetes Brother   . Colon cancer Neg Hx   . Prostate cancer Neg Hx   . Stroke Neg Hx   . CAD Neg Hx     SOCIAL HISTORY: History   Social History  . Marital Status: Married    Spouse Name: N/A    Number of Children: 4  . Years of Education: N/A   Occupational History  . [employer]-- retired 2010    Social History Main Topics  . Smoking status: Former Research scientist (life sciences)  . Smokeless tobacco: Never Used     Comment: Quit 46 yrs ago  . Alcohol Use: No     Comment: none  . Drug Use: No  . Sexual Activity: Not on file   Other Topics Concern  . Not on file   Social History Narrative   married, lives w/ second wife, 2 living children, lost 2 kids      REVIEW OF SYSTEMS: Constitutional: No fevers, chills, or sweats, no generalized fatigue, change in appetite Eyes: No visual changes, double vision, eye pain Ear, nose and throat: No hearing loss, ear pain, nasal congestion, sore throat Cardiovascular: No chest pain, palpitations Respiratory:  No shortness of breath at rest or with exertion, wheezes GastrointestinaI: No nausea, vomiting, diarrhea, abdominal  pain, fecal incontinence Genitourinary:  No dysuria, urinary retention or frequency Musculoskeletal:  No neck pain, back pain Integumentary: No rash, pruritus, skin lesions Neurological: as above Psychiatric: No depression, insomnia, anxiety Endocrine: Diaphoresis.  No palpitations, fatigue, mood swings, change in appetite, change in weight, increased thirst Hematologic/Lymphatic:  No anemia, purpura, petechiae. Allergic/Immunologic: no itchy/runny eyes, nasal congestion, recent allergic reactions, rashes  PHYSICAL EXAM: Filed Vitals:   05/20/14 1249  BP: 148/80  Pulse: 58   General: No acute distress Head:  Normocephalic/atraumatic Neck: supple, no paraspinal tenderness, full  range of motion Heart:  Regular rate and rhythm Lungs:  Clear to auscultation bilaterally Back: No paraspinal tenderness Neurological Exam: alert and oriented to person, place, and time. Attention span and concentration intact, recent and remote memory intact, fund of knowledge intact.  Speech fluent and not dysarthric, language intact.  Left ptosis, otherwise CN II-XII intact. Fundoscopic exam unremarkable without vessel changes, exudates, hemorrhages or papilledema.  Bulk and tone normal, muscle strength 5/5 throughout.  Sensation to light touch, temperature and vibration intact.  Deep tendon reflexes trace throughout.  Finger to nose testing intact.  Gait normal, Romberg negative.  IMPRESSION: Left ocular myasthenia, stable.  PLAN: 1.  Continue prednisone 15mg  daily.  I would not want to reduce dose further during the summer months because heat exacerbates symptoms. 2.  Omeprazole and calcium plus D supplementation daily while on prednisone 3.  Recommend for PCP to monitor glucose levels while on prednisone 4.  Follow up at end of September or beginning of October.  If stable, we can continue tapering down the prednisone.  Metta Clines, DO  CC:  [name], MD

## 2014-05-20 NOTE — Patient Instructions (Signed)
1.  Continue the prednisone 10mg  tablets.  Take 1 and 1/2 tablets daily.  Do not reduce dose any further. 2.  Take the omeprazole daily for stomach protection and calcium plus vitamin D supplement daily 3.  Follow up in 2 months with plan to reduce the dose of the prednisone further.

## 2014-07-04 ENCOUNTER — Other Ambulatory Visit: Payer: Self-pay | Admitting: Cardiovascular Disease

## 2014-07-16 ENCOUNTER — Other Ambulatory Visit: Payer: Self-pay | Admitting: *Deleted

## 2014-07-16 DIAGNOSIS — K219 Gastro-esophageal reflux disease without esophagitis: Secondary | ICD-10-CM

## 2014-07-16 MED ORDER — OMEPRAZOLE 20 MG PO CPDR: 20.0000 mg | DELAYED_RELEASE_CAPSULE | Freq: Every day | ORAL | Status: DC

## 2014-07-21 ENCOUNTER — Ambulatory Visit (INDEPENDENT_AMBULATORY_CARE_PROVIDER_SITE_OTHER): Payer: Medicare Other | Admitting: Neurology

## 2014-07-21 ENCOUNTER — Encounter: Payer: Self-pay | Admitting: Neurology

## 2014-07-21 VITALS — BP 140/90 | HR 70 | Temp 97.9°F | Resp 18 | Wt 292.0 lb

## 2014-07-21 DIAGNOSIS — G7000 Myasthenia gravis without (acute) exacerbation: Secondary | ICD-10-CM

## 2014-07-21 DIAGNOSIS — G453 Amaurosis fugax: Secondary | ICD-10-CM

## 2014-07-21 DIAGNOSIS — H3400 Transient retinal artery occlusion, unspecified eye: Secondary | ICD-10-CM

## 2014-07-21 NOTE — Patient Instructions (Addendum)
MRI  Brain  [phone] without contrast 07/30/14 at 12:30 pm  Carotid doppler 07/30/1509:45am [facility]   Glucose serum  Return in 1 months

## 2014-07-21 NOTE — Progress Notes (Signed)
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  He is accompanied by his wife.  UPDATE: Currently taking:  Prednisone 15mg  daily, omeprazole and calcium plus vitamin D.  He reports no worsening of the ptosis.  He denies double vision, shortness of breath, dysphagia or weakness of the neck or limbs.  He reports an episode that occurred last week.  He was sipping a Dr. Malachi Bonds, when suddenly he had some vision loss in the left half of his visual field.  When he closed either eye, he noted that the right eye was fine.  There was some associated dizziness.  It lasted for 2 minutes and resolved.  About a month before that, he began seeing brief flashes in the left part of his vision whenever he blinks.  He saw his ophthalmologist at that time who told him he had "floaters".  He also had an episode of diffuse sweating over the weekend.   02/02/14:  Glucose 86.  HISTORY: He developed left eyelid ptosis after knee surgery last [name].  He denied any blurred vision or diplopia.  Ach binding antibodies were 19.4 and modulating antibodies were 86.  CT of chest revealed no abnormalities of thymus.  TSH 0.38.  He was started on Mestinon and developed loose stool on dose of 60mg  BID.  He has history of bradycardia and first degree heart black.  Palpitations worsened when started on Mestinon.  Dose was reduced to 30mg  BID and then once daily, which did improve his symptoms.  His cardiologist, Dr. Acie Fredrickson, discontinued the carvedilol to see if this contributed to the bradycardia.  He still was experiencing diarrhea on low-dose Mestinon, so it was discontinued and he was started on prednisone.  PAST MEDICAL HISTORY: Past Medical History  Diagnosis Date  . Dysrhythmia   . High blood pressure   . MG, ocular (myasthenia  gravis)     ? of     MEDICATIONS: Current Outpatient Prescriptions on File Prior to Visit  Medication Sig Dispense Refill  . aspirin 81 MG tablet Take 81 mg by mouth daily.      . fish oil-omega-3 fatty acids 1000 MG capsule Take 1 g by mouth daily.      Marland Kitchen losartan (COZAAR) 100 MG tablet TAKE 1 TABLET (100 MG TOTAL) BY MOUTH DAILY.  90 tablet  0  . omeprazole (PRILOSEC) 20 MG capsule Take 1 capsule (20 mg total) by mouth daily.  90 capsule  2  . predniSONE (DELTASONE) 10 MG tablet Take 1.5 tablets (15 mg total) by mouth daily with breakfast.  45 tablet  1  . simvastatin (ZOCOR) 80 MG tablet TAKE 1 TABLET (80 MG TOTAL) BY MOUTH DAILY.  90 tablet  0   No current facility-administered medications on file prior to visit.    ALLERGIES: No Known Allergies  FAMILY HISTORY: Family History  Problem Relation Age of Onset  . Diabetes Brother   . Colon cancer Neg Hx   . Prostate cancer Neg Hx   . Stroke Neg Hx   . CAD Neg Hx     SOCIAL HISTORY: History   Social History  . Marital Status: Married    Spouse Name: N/A    Number of Children: 4  . Years of Education: N/A   Occupational History  . [employer]-- retired  2010    Social History Main Topics  . Smoking status: Former Research scientist (life sciences)  . Smokeless tobacco: Never Used     Comment: Quit 46 yrs ago  . Alcohol Use: No     Comment: none  . Drug Use: No  . Sexual Activity: Not on file   Other Topics Concern  . Not on file   Social History Narrative   married, lives w/ second wife, 2 living children, lost 2 kids      REVIEW OF SYSTEMS: Constitutional: No fevers, chills, or sweats, no generalized fatigue, change in appetite Eyes: No visual changes, double vision, eye pain Ear, nose and throat: No hearing loss, ear pain, nasal congestion, sore throat Cardiovascular: No chest pain, palpitations Respiratory:  No shortness of breath at rest or with exertion, wheezes GastrointestinaI: No nausea, vomiting, diarrhea, abdominal  pain, fecal incontinence Genitourinary:  No dysuria, urinary retention or frequency Musculoskeletal:  No neck pain, back pain Integumentary: No rash, pruritus, skin lesions Neurological: as above Psychiatric: No depression, insomnia, anxiety Endocrine: No palpitations, fatigue, diaphoresis, mood swings, change in appetite, change in weight, increased thirst Hematologic/Lymphatic:  No anemia, purpura, petechiae. Allergic/Immunologic: no itchy/runny eyes, nasal congestion, recent allergic reactions, rashes  PHYSICAL EXAM: Filed Vitals:   07/21/14 1223  BP: 140/90  Pulse: 70  Temp: 97.9 F (36.6 C)  Resp: 18   General: No acute distress Head:  Normocephalic/atraumatic Neck: supple, no paraspinal tenderness, full range of motion Heart:  Regular rate and rhythm Lungs:  Clear to auscultation bilaterally Back: No paraspinal tenderness Neurological Exam: alert and oriented to person, place, and time. Attention span and concentration intact, recent and remote memory intact, fund of knowledge intact. Speech fluent and not dysarthric, language intact. Left ptosis, otherwise CN II-XII intact. Fundoscopic exam unremarkable without vessel changes, exudates, hemorrhages or papilledema. Bulk and tone normal, muscle strength 5/5 throughout. Sensation to light touch, temperature and vibration intact. Deep tendon reflexes trace throughout. Finger to nose testing intact. Gait normal, Romberg negative.  IMPRESSION: Left ocular myasthenia Left amaurosis fugax  PLAN: Will start to slowly taper prednisone.  We will start prednisone 10mg  daily. Omeprazole and calcium plus D supplementation while on prednisone Check fasting serum glucose MRI of the brain without contrast Carotid doppler Continue ASA for now. Follow up in 4-6 weeks  Metta Clines, DO  CC:  [name] November, MD

## 2014-07-23 ENCOUNTER — Telehealth: Payer: Self-pay | Admitting: *Deleted

## 2014-07-23 LAB — GLUCOSE, RANDOM: GLUCOSE: 95 mg/dL (ref 70–99)

## 2014-07-23 NOTE — Telephone Encounter (Signed)
Message copied by Claudie Revering on Thu Jul 23, 2014 12:48 PM ------      Message from: JAFFE, ADAM R      Created: Thu Jul 23, 2014  6:33 AM       Blood sugars look okay      ----- Message -----         From: [facility] Interface         Sent: 07/23/2014  12:36 AM           To: Dudley Major, DO                   ------

## 2014-07-30 ENCOUNTER — Ambulatory Visit
Admission: RE | Admit: 2014-07-30 | Discharge: 2014-07-30 | Disposition: A | Discharge status: Home / Self Care | Payer: Medicare Other | Source: Ambulatory Visit | Attending: Neurology | Admitting: Neurology

## 2014-07-30 DIAGNOSIS — G7000 Myasthenia gravis without (acute) exacerbation: Secondary | ICD-10-CM

## 2014-08-05 ENCOUNTER — Ambulatory Visit: Payer: Medicare Other | Admitting: Internal Medicine

## 2014-08-12 ENCOUNTER — Encounter: Payer: Self-pay | Admitting: Internal Medicine

## 2014-08-12 ENCOUNTER — Ambulatory Visit (INDEPENDENT_AMBULATORY_CARE_PROVIDER_SITE_OTHER): Payer: Medicare Other | Admitting: Internal Medicine

## 2014-08-12 VITALS — BP 159/78 | HR 58 | Temp 97.9°F | Wt 288.2 lb

## 2014-08-12 DIAGNOSIS — E785 Hyperlipidemia, unspecified: Secondary | ICD-10-CM

## 2014-08-12 DIAGNOSIS — G7000 Myasthenia gravis without (acute) exacerbation: Secondary | ICD-10-CM

## 2014-08-12 DIAGNOSIS — I10 Essential (primary) hypertension: Secondary | ICD-10-CM

## 2014-08-12 DIAGNOSIS — Z23 Encounter for immunization: Secondary | ICD-10-CM

## 2014-08-12 LAB — BASIC METABOLIC PANEL
BUN: 13 mg/dL (ref 6–23)
CHLORIDE: 103 meq/L (ref 96–112)
CO2: 33 meq/L — AB (ref 19–32)
Calcium: 9.1 mg/dL (ref 8.4–10.5)
Creatinine, Ser: 1.2 mg/dL (ref 0.4–1.5)
GFR: 66.42 mL/min (ref >60.00)
GLUCOSE: 94 mg/dL (ref 70–99)
POTASSIUM: 4.6 meq/L (ref 3.5–5.1)
Sodium: 140 mEq/L (ref 135–145)

## 2014-08-12 NOTE — Patient Instructions (Signed)
Get your blood work before you leave   START NIACIN 500 MG 1 TABLET AT NIGHT WITH A SMALL SNACK, AFTER A MONTH, INCREASE TO TWO TABLETS   Check the  blood pressure  weekly  Be sure your blood pressure is between 145/85  and 110/65.  if it is consistently higher or lower, let me know    Please come back to the office by 01-2015  for a physical exam. Come back fasting

## 2014-08-12 NOTE — Progress Notes (Signed)
Subjective:    Patient ID: [name] [name], male    DOB: Mar 20, 1941, 73 y.o.   MRN: [id number]  DOS:  08/12/2014 Type of visit - description : Followup Interval history: Myasthenia gravis, saw neurology, decreasing prednisone dose Hypertension, BP today slightly elevated, normal ambulatory BPs when he checks at the store Cholesterol, based on last FLP I recommended niacin, has not started it yet. Had a flu shot, prevnar?   ROS Chest pain or difficulty breathing No nausea, vomiting, diarrhea  Past Medical History  Diagnosis Date  . Dysrhythmia   . High blood pressure   . MG, ocular (myasthenia gravis)     ? of     Past Surgical History  Procedure Laterality Date  . Hernia repair      bilateral inguinal hernia  . Hernia repair      umbilical  . Joint replacement  2009    right knee  . 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 Middle ear surgery Right     ear -patched hole in ear drum    History   Social History  . Marital Status: Married    Spouse Name: N/A    Number of Children: 4  . Years of Education: N/A   Occupational History  . [employer]-- retired 2010    Social History Main Topics  . Smoking status: Former Research scientist (life sciences)  . Smokeless tobacco: Never Used     Comment: Quit 46 yrs ago  . Alcohol Use: No     Comment: none  . Drug Use: No  . Sexual Activity: Not on file   Other Topics Concern  . Not on file   Social History Narrative   married, lives w/ second wife, 2 living children, lost 2 kids          Medication List       This list is accurate as of: 08/12/14 11:59 PM.  Always use your most recent med list.               aspirin 81 MG tablet  Take 81 mg by mouth daily.     fish oil-omega-3 fatty acids 1000 MG capsule  Take 1 g by mouth daily.     losartan 100 MG tablet  Commonly known as:  COZAAR  TAKE  1 TABLET (100 MG TOTAL) BY MOUTH DAILY.     omeprazole 20 MG capsule  Commonly known as:  PRILOSEC  Take 1 capsule (20 mg total) by mouth daily.     predniSONE 10 MG tablet  Commonly known as:  DELTASONE  Take 10 mg by mouth daily with breakfast.     simvastatin 80 MG tablet  Commonly known as:  ZOCOR  TAKE 1 TABLET (80 MG TOTAL) BY MOUTH DAILY.           Objective:   Physical Exam BP 159/78  Pulse 58  Temp(Src) 97.9 F (36.6 C) (Oral)  Wt 288 lb 4 oz (130.749 kg)  SpO2 97%  General -- alert, well-developed, NAD.  Lungs -- normal respiratory effort, no intercostal retractions, no accessory muscle use, and normal breath sounds.  Heart-- normal rate, regular rhythm, no murmur.   Extremities-- no pretibial edema bilaterally  Neurologic--  alert & oriented X3. Speech normal, gait appropriate for age  Psych-- Cognition and judgment appear intact. Cooperative with normal attention span  and concentration. No anxious or depressed appearing.      Assessment & Plan:

## 2014-08-12 NOTE — Progress Notes (Signed)
Pre visit review using our clinic review tool, if applicable. No additional management support is needed unless otherwise documented below in the visit note. 

## 2014-08-12 NOTE — Assessment & Plan Note (Signed)
Good compliance with simvastatin 80 mg, last FLP okay except for increased triglycerides, recommend OTC niacin.

## 2014-08-12 NOTE — Assessment & Plan Note (Signed)
decreasing dose of prednisone

## 2014-08-12 NOTE — Assessment & Plan Note (Addendum)
BP today slightly elevated, at the other office visits BPs are okay. Plan: BMP, check ambulatory BPs, continue with present care. See instructions

## 2014-08-17 ENCOUNTER — Encounter: Payer: Self-pay | Admitting: *Deleted

## 2014-08-24 ENCOUNTER — Encounter: Payer: Self-pay | Admitting: Neurology

## 2014-08-24 ENCOUNTER — Ambulatory Visit (INDEPENDENT_AMBULATORY_CARE_PROVIDER_SITE_OTHER): Payer: Medicare Other | Admitting: Neurology

## 2014-08-24 ENCOUNTER — Other Ambulatory Visit: Payer: Self-pay | Admitting: Physician Assistant

## 2014-08-24 VITALS — BP 138/74 | HR 78 | Temp 98.3°F | Resp 18 | Wt 290.7 lb

## 2014-08-24 DIAGNOSIS — G453 Amaurosis fugax: Secondary | ICD-10-CM

## 2014-08-24 DIAGNOSIS — G7000 Myasthenia gravis without (acute) exacerbation: Secondary | ICD-10-CM

## 2014-08-24 MED ORDER — PREDNISONE 10 MG PO TABS: 5.0000 mg | ORAL_TABLET | Freq: Every day | ORAL | Status: DC

## 2014-08-24 NOTE — Patient Instructions (Signed)
1. Decrease the prednisone to 1/2 tablet every morning with breakfast 2.  Aspirin 81mg  daily 3.  Follow up in 4 weeks.

## 2014-08-24 NOTE — Progress Notes (Signed)
NEUROLOGY FOLLOW UP OFFICE NOTE  [facility] [id number]  HISTORY OF PRESENT ILLNESS: [name] [name] is a 73 year old man with history of palpitations, bradycardia, and high blood pressure who returns to follow up for ocular myasthenia.  He is accompanied by his wife.   UPDATE: Currently taking:  Prednisone 10mg  daily, omeprazole and calcium plus vitamin D.  He reports no worsening of the ptosis.  He denies double vision, shortness of breath, dysphagia or weakness of the neck or limbs.  08/12/14 glucose 94  In September, he suddenly he had some vision loss in the left half of his visual field.  When he closed either eye, he noted that the right eye was fine.  There was some associated dizziness.  It lasted for 2 minutes and resolved.  About a month before that, he began seeing brief flashes in the left part of his vision whenever he blinks.  He saw his ophthalmologist at that time who told him he had "floaters".  MRI of the brain without contrast was performed on 07/30/14, which revealed mild chronic small vessel ischemic changes but no acute intracranial abnormalities.  Carotid doppler revealed no significant plaque or stenosis.  LDL from 02/02/14 was 60.  Blood glucose level from 07/21/14 was 95.  He is on ASA.  HISTORY: He developed left eyelid ptosis after knee surgery last November.  He denied any blurred vision or diplopia.  Ach binding antibodies were 19.4 and modulating antibodies were 86.  CT of chest revealed no abnormalities of thymus.  TSH 0.38.  He was started on Mestinon and developed loose stool on dose of 60mg  BID.  He has history of bradycardia and first degree heart black.  Palpitations worsened when started on Mestinon.  Dose was reduced to 30mg  BID and then once daily, which did improve his symptoms.  His cardiologist, Dr. Acie Fredrickson, discontinued the carvedilol to see if this contributed to the bradycardia.  He still was experiencing diarrhea on low-dose Mestinon, so it was  discontinued and he was started on prednisone.  PAST MEDICAL HISTORY: Past Medical History  Diagnosis Date  . Dysrhythmia   . High blood pressure   . MG, ocular (myasthenia gravis)     ? of     MEDICATIONS: Current Outpatient Prescriptions on File Prior to Visit  Medication Sig Dispense Refill  . aspirin 81 MG tablet Take 81 mg by mouth daily.    . fish oil-omega-3 fatty acids 1000 MG capsule Take 1 g by mouth daily.    Marland Kitchen losartan (COZAAR) 100 MG tablet TAKE 1 TABLET (100 MG TOTAL) BY MOUTH DAILY. 90 tablet 0  . omeprazole (PRILOSEC) 20 MG capsule Take 1 capsule (20 mg total) by mouth daily. 90 capsule 2  . simvastatin (ZOCOR) 80 MG tablet TAKE 1 TABLET (80 MG TOTAL) BY MOUTH DAILY. 90 tablet 0   No current facility-administered medications on file prior to visit.    ALLERGIES: No Known Allergies  FAMILY HISTORY: Family History  Problem Relation Age of Onset  . Diabetes Brother   . Colon cancer Neg Hx   . Prostate cancer Neg Hx   . Stroke Neg Hx   . CAD Neg Hx     SOCIAL HISTORY: History   Social History  . Marital Status: Married    Spouse Name: N/A    Number of Children: 4  . Years of Education: N/A   Occupational History  . [employer]-- retired 2010    Social History Main  Topics  . Smoking status: Former Research scientist (life sciences)  . Smokeless tobacco: Never Used     Comment: Quit 46 yrs ago  . Alcohol Use: No     Comment: none  . Drug Use: No  . Sexual Activity: Not on file   Other Topics Concern  . Not on file   Social History Narrative   married, lives w/ second wife, 2 living children, lost 2 kids      REVIEW OF SYSTEMS: Constitutional: No fevers, chills, or sweats, no generalized fatigue, change in appetite Eyes: No visual changes, double vision, eye pain Ear, nose and throat: No hearing loss, ear pain, nasal congestion, sore throat Cardiovascular: No chest pain, palpitations Respiratory:  No shortness of breath at rest or with exertion,  wheezes GastrointestinaI: No nausea, vomiting, diarrhea, abdominal pain, fecal incontinence Genitourinary:  No dysuria, urinary retention or frequency Musculoskeletal:  No neck pain, back pain Integumentary: No rash, pruritus, skin lesions Neurological: as above Psychiatric: No depression, insomnia, anxiety Endocrine: No palpitations, fatigue, diaphoresis, mood swings, change in appetite, change in weight, increased thirst Hematologic/Lymphatic:  No anemia, purpura, petechiae. Allergic/Immunologic: no itchy/runny eyes, nasal congestion, recent allergic reactions, rashes  PHYSICAL EXAM: Filed Vitals:   08/24/14 1046  BP: 138/74  Pulse: 78  Temp: 98.3 F (36.8 C)  Resp: 18   General: No acute distress Head:  Normocephalic/atraumatic Neck: supple, no paraspinal tenderness, full range of motion Heart:  Regular rate and rhythm Lungs:  Clear to auscultation bilaterally Back: No paraspinal tenderness Neurological Exam: alert and oriented to person, place, and time. Attention span and concentration intact, recent and remote memory intact, fund of knowledge intact.  Speech fluent and not dysarthric, language intact.  Left ptosis, otherwise CN II-XII intact. Fundoscopic exam unremarkable without vessel changes, exudates, hemorrhages or papilledema.  Bulk and tone normal, muscle strength 5/5 throughout.  Sensation to light touch, temperature and vibration intact.  Deep tendon reflexes 2+ throughout, toes downgoing.  Finger to nose and heel to shin testing intact.  Gait normal, Romberg negative.  IMPRESSION: Left Ocular myasthenia Left Amaurosis fugax  PLAN: Decrease prednisone to 5mg  at breakfast ASA daily and niacin Follow up in 4 weeks  Metta Clines, DO  CC:  [name], MD

## 2014-09-21 ENCOUNTER — Encounter: Payer: Self-pay | Admitting: Neurology

## 2014-09-21 ENCOUNTER — Ambulatory Visit (INDEPENDENT_AMBULATORY_CARE_PROVIDER_SITE_OTHER): Payer: Medicare Other | Admitting: Neurology

## 2014-09-21 VITALS — BP 128/68 | HR 68 | Temp 98.3°F | Resp 18 | Wt 293.1 lb

## 2014-09-21 DIAGNOSIS — G453 Amaurosis fugax: Secondary | ICD-10-CM

## 2014-09-21 DIAGNOSIS — G7000 Myasthenia gravis without (acute) exacerbation: Secondary | ICD-10-CM

## 2014-09-21 NOTE — Patient Instructions (Signed)
Start taking the prednisone 5mg  every other day.  In 4 weeks, call for further instructions.  If you experience any worsening symptoms or double vision, call immediately. Continue Prilosec, calcium and vitamin D Follow up in 2 months but call in 4 weeks.

## 2014-09-21 NOTE — Progress Notes (Signed)
NEUROLOGY FOLLOW UP OFFICE NOTE  [facility] [id number]  HISTORY OF PRESENT ILLNESS: Shad Ledvina is a 73 year old man with high blood pressure and history of palpitations, bradycardia and amaurosis fugax who returns to follow up for ocular myasthenia and left amaurosis fugax.  He is accompanied by his wife.  UPDATE: Currently taking:  Prednisone 5mg  daily, omeprazole and calcium plus vitamin D.  He is doing well.  HISTORY: He developed left eyelid ptosis after knee surgery last November.  He denied any blurred vision or diplopia.  Ach binding antibodies were 19.4 and modulating antibodies were 86.  CT of chest revealed no abnormalities of thymus.  TSH 0.38.  He was started on Mestinon and developed loose stool on dose of 60mg  BID.  He has history of bradycardia and first degree heart black.  Palpitations worsened when started on Mestinon.  Dose was reduced to 30mg  BID and then once daily, which did improve his symptoms.  His cardiologist, Dr. Acie Fredrickson, discontinued the carvedilol to see if this contributed to the bradycardia.  He still was experiencing diarrhea on low-dose Mestinon, so it was discontinued and he was started on prednisone.  In September, he suddenly he had some vision loss in the left half of his visual field.  When he closed either eye, he noted that the right eye was fine.  There was some associated dizziness.  It lasted for 2 minutes and resolved.  About a month before that, he began seeing brief flashes in the left part of his vision whenever he blinks.  He saw his ophthalmologist at that time who told him he had "floaters".  MRI of the brain without contrast was performed on 07/30/14, which revealed mild chronic small vessel ischemic changes but no acute intracranial abnormalities.  Carotid doppler revealed no significant plaque or stenosis.  LDL from 02/02/14 was 60.  Blood glucose level from 07/21/14 was 95.  He is on ASA.  PAST MEDICAL HISTORY: Past Medical History    Diagnosis Date  . Dysrhythmia   . High blood pressure   . MG, ocular (myasthenia gravis)     ? of     MEDICATIONS: Current Outpatient Prescriptions on File Prior to Visit  Medication Sig Dispense Refill  . aspirin 81 MG tablet Take 81 mg by mouth daily.    . fish oil-omega-3 fatty acids 1000 MG capsule Take 1 g by mouth daily.    Marland Kitchen losartan (COZAAR) 100 MG tablet TAKE 1 TABLET (100 MG TOTAL) BY MOUTH DAILY. 90 tablet 0  . omeprazole (PRILOSEC) 20 MG capsule Take 1 capsule (20 mg total) by mouth daily. 90 capsule 2  . predniSONE (DELTASONE) 10 MG tablet Take 0.5 tablets (5 mg total) by mouth daily with breakfast. 15 tablet 1  . simvastatin (ZOCOR) 80 MG tablet TAKE 1 TABLET BY MOUTH EVERY DAY 90 tablet 1   No current facility-administered medications on file prior to visit.    ALLERGIES: No Known Allergies  FAMILY HISTORY: Family History  Problem Relation Age of Onset  . Diabetes Brother   . Colon cancer Neg Hx   . Prostate cancer Neg Hx   . Stroke Neg Hx   . CAD Neg Hx     SOCIAL HISTORY: History   Social History  . Marital Status: Married    Spouse Name: N/A    Number of Children: 4  . Years of Education: N/A   Occupational History  . [employer]-- retired 2010  Social History Main Topics  . Smoking status: Former Research scientist (life sciences)  . Smokeless tobacco: Never Used     Comment: Quit 46 yrs ago  . Alcohol Use: No     Comment: none  . Drug Use: No  . Sexual Activity:    Partners: Female   Other Topics Concern  . Not on file   Social History Narrative   married, lives w/ second wife, 2 living children, lost 2 kids      REVIEW OF SYSTEMS: Constitutional: No fevers, chills, or sweats, no generalized fatigue, change in appetite Eyes: No visual changes, double vision, eye pain Ear, nose and throat: No hearing loss, ear pain, nasal congestion, sore throat Cardiovascular: No chest pain, palpitations Respiratory:  No shortness of breath at rest or with exertion,  wheezes GastrointestinaI: No nausea, vomiting, diarrhea, abdominal pain, fecal incontinence Genitourinary:  No dysuria, urinary retention or frequency Musculoskeletal:  No neck pain, back pain Integumentary: No rash, pruritus, skin lesions Neurological: as above Psychiatric: No depression, insomnia, anxiety Endocrine: No palpitations, fatigue, diaphoresis, mood swings, change in appetite, change in weight, increased thirst Hematologic/Lymphatic:  No anemia, purpura, petechiae. Allergic/Immunologic: no itchy/runny eyes, nasal congestion, recent allergic reactions, rashes  PHYSICAL EXAM: Filed Vitals:   09/21/14 1033  BP: 128/68  Pulse: 68  Temp: 98.3 F (36.8 C)  Resp: 18   General: No acute distress Head:  Normocephalic/atraumatic Eyes:  Fundoscopic exam unremarkable without vessel changes, exudates, hemorrhages or papilledema. Neck: supple, no paraspinal tenderness, full range of motion Heart:  Regular rate and rhythm Lungs:  Clear to auscultation bilaterally Back: No paraspinal tenderness Neurological Exam: alert and oriented to person, place, and time. Attention span and concentration intact, recent and remote memory intact, fund of knowledge intact.  Speech fluent and not dysarthric, language intact.  Left ptosis, otherwise CN II-XII intact. Fundoscopic exam unremarkable without vessel changes, exudates, hemorrhages or papilledema.  Bulk and tone normal, muscle strength 5/5 throughout.  Finger to nose testing intact.  Gait normal  IMPRESSION: Left Ocular myasthenia  PLAN: Decrease prednisone to 5mg  every other day and call in 4 weeks.  If stable, will decrease dose to three times a week. Omeprazole, calcium and vitamin D ASA daily and niacin for secondary stroke prevention. Follow up in 2 weeks  15 minutes spent with patient, over 50% spent discussing the plan.  Metta Clines, DO  CC: [name], MD

## 2014-09-23 ENCOUNTER — Other Ambulatory Visit: Payer: Self-pay

## 2014-10-01 ENCOUNTER — Other Ambulatory Visit: Payer: Self-pay | Admitting: Cardiovascular Disease

## 2014-10-23 DIAGNOSIS — H524 Presbyopia: Secondary | ICD-10-CM

## 2014-10-23 DIAGNOSIS — H5200 Hypermetropia, unspecified eye: Secondary | ICD-10-CM

## 2014-10-23 HISTORY — DX: Presbyopia: H52.4

## 2014-10-23 HISTORY — DX: Hypermetropia, unspecified eye: H52.00

## 2014-11-19 ENCOUNTER — Ambulatory Visit (INDEPENDENT_AMBULATORY_CARE_PROVIDER_SITE_OTHER): Payer: Medicare Other | Admitting: Cardiovascular Disease

## 2014-11-19 ENCOUNTER — Encounter: Payer: Self-pay | Admitting: Cardiovascular Disease

## 2014-11-19 VITALS — BP 160/82 | HR 82 | Ht 70.0 in | Wt 298.6 lb

## 2014-11-19 DIAGNOSIS — R001 Bradycardia, unspecified: Secondary | ICD-10-CM | POA: Diagnosis not present

## 2014-11-19 DIAGNOSIS — I10 Essential (primary) hypertension: Secondary | ICD-10-CM

## 2014-11-19 MED ORDER — SIMVASTATIN 40 MG PO TABS: 40.0000 mg | ORAL_TABLET | Freq: Every day | ORAL | Status: DC

## 2014-11-19 MED ORDER — HYDROCHLOROTHIAZIDE 25 MG PO TABS: 25.0000 mg | ORAL_TABLET | Freq: Every day | ORAL | Status: DC

## 2014-11-19 MED ORDER — POTASSIUM CHLORIDE ER 10 MEQ PO TBCR: 10.0000 meq | EXTENDED_RELEASE_TABLET | Freq: Every day | ORAL | Status: DC

## 2014-11-19 NOTE — Patient Instructions (Signed)
Your physician has recommended you make the following change in your medication: decrease Simvastatin to 40 mg daily and start taking HCTZ 25 mg daily and Potassium 10 meq daily  Your physician recommends that you return for lab work in: in 3 weeks  Your physician wants you to follow-up in: 6 months. You will receive a reminder letter in the mail two months in advance. If you don't receive a letter, please call our office to schedule the follow-up appointment.

## 2014-11-19 NOTE — Progress Notes (Signed)
Cardiology Office Note   Date:  11/19/2014   ID:  [name] [name], DOB 1941/10/15, MRN [id number]  PCP:  [name], MD  Cardiologist:   Acie Fredrickson [name], MD   Chief Complaint  Patient presents with  . Hypertension      History of Present Illness: [name] [name] is a 74 y.o. male who presents for follow up.  Problem List  Problem List: 1. Palpitations 2. myasthemia gravis ( newly diagnosed)  3. Bradycardia 4. Hypertension 5. Hyperlipidemia   Has some dyspnea.  Has some vague left sided chest pain.  Lasts for a second.  Typically after eating  - likely is gas pain. Gas like pain  Has DOE. Walks when he can.  Not much walking in the snow.   His wife had surgery at the first part of December. Since that time he's been doing all of the cooking and as a result has gained some weight. We discussed alternatives to his diet.   Past Medical History  Diagnosis Date  . Dysrhythmia   . High blood pressure   . MG, ocular (myasthenia gravis)     ? of     Past Surgical History  Procedure Laterality Date  . Hernia repair      bilateral inguinal hernia  . Hernia repair      umbilical  . Joint replacement  2009    right knee  . 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 Middle ear surgery Right     ear -patched hole in ear drum     Current Outpatient Prescriptions  Medication Sig Dispense Refill  . aspirin 81 MG tablet Take 81 mg by mouth daily.    . fish oil-omega-3 fatty acids 1000 MG capsule Take 1 g by mouth daily.    Marland Kitchen losartan (COZAAR) 100 MG tablet TAKE 1 TABLET (100 MG TOTAL) BY MOUTH DAILY. 90 tablet 3  . predniSONE (DELTASONE) 10 MG tablet Take 0.5 tablets (5 mg total) by mouth daily with breakfast. 15 tablet 1  . simvastatin (ZOCOR) 80 MG tablet TAKE 1 TABLET BY MOUTH EVERY DAY 90 tablet 1  . omeprazole (PRILOSEC) 20 MG capsule Take 1  capsule (20 mg total) by mouth daily. (Patient not taking: Reported on 11/19/2014) 90 capsule 2   No current facility-administered medications for this visit.    Allergies:   Review of patient's allergies indicates no known allergies.    Social History:  The patient  reports that he has quit smoking. He has never used smokeless tobacco. He reports that he does not drink alcohol or use illicit drugs.   Family History:  The patient's family history includes Diabetes in his brother. There is no history of Colon cancer, Prostate cancer, Stroke, or CAD.    ROS:  Please see the history of present illness.    Review of Systems: Constitutional:  denies fever, chills, diaphoresis, appetite change and fatigue. + weight gain .   HEENT: denies photophobia, eye pain, redness, hearing loss, ear pain, congestion, sore throat, rhinorrhea, sneezing, neck pain, neck stiffness and tinnitus.  Respiratory: denies SOB, DOE, cough, chest tightness, and wheezing.  Cardiovascular: denies chest pain, palpitations and leg swelling.  Gastrointestinal: denies nausea, vomiting, abdominal pain, diarrhea, constipation, blood in stool.  Genitourinary: denies dysuria, urgency, frequency, hematuria, flank pain and difficulty urinating.  Musculoskeletal: denies  myalgias,  back pain, joint swelling, arthralgias and gait problem.   Skin: denies pallor, rash and wound.  Neurological: denies dizziness, seizures, syncope, weakness, light-headedness, numbness and headaches.   Hematological: denies adenopathy, easy bruising, personal or family bleeding history.  Psychiatric/ Behavioral: denies suicidal ideation, mood changes, confusion, nervousness, sleep disturbance and agitation.       All other systems are reviewed and negative.    PHYSICAL EXAM: VS:  BP 160/82 mmHg  Pulse 82  Ht 5\' 10"  (1.778 m)  Wt 298 lb 9.6 oz (135.444 kg)  BMI 42.84 kg/m2  SpO2 97% , BMI Body mass index is 42.84 kg/(m^2). GEN: Well nourished,  well developed, in no acute distress HEENT: normal Neck: no JVD, carotid bruits, or masses Cardiac: RRR; no murmurs, rubs, or gallops,no edema  Respiratory:  clear to auscultation bilaterally, normal work of breathing GI: soft, nontender, nondistended, + BS MS: no deformity or atrophy Skin: warm and dry, no rash Neuro:  Strength and sensation are intact Psych: normal   EKG:  EKG is ordered today. The ekg ordered today demonstrates NSR at 94,  No ST or T wave changes.     Recent Labs: 02/02/2014: ALT 19; Hemoglobin 13.7; Platelets 234.0 08/12/2014: BUN 13; Creatinine 1.2; Potassium 4.6; Sodium 140    Lipid Panel    Component Value Date/Time   CHOL 162 02/02/2014 1123   TRIG 278.0* 02/02/2014 1123   TRIG 247* 08/30/2006 1117   HDL 46.70 02/02/2014 1123   CHOLHDL 3 02/02/2014 1123   VLDL 55.6* 02/02/2014 1123   LDLCALC 60 02/02/2014 1123   LDLDIRECT 85.5 01/30/2013 1103   LDLDIRECT 163.5 08/30/2006 1117      Wt Readings from Last 3 Encounters:  11/19/14 298 lb 9.6 oz (135.444 kg)  09/21/14 293 lb 1.6 oz (132.949 kg)  08/24/14 290 lb 11.2 oz (131.861 kg)      Other studies Reviewed: Additional studies/ records that were reviewed today include: . Review of the above records demonstrates:    ASSESSMENT AND PLAN:  Problem List: 1. Palpitations 2. myasthemia gravis ( newly diagnosed)  3. Bradycardia 4. Hypertension 5. Hyperlipidemia - I noticed that he was on simvastatin 80 mg a day. We typically do not give within 40 mg a day. I decrease his simvastatin to 40 mg a day. He will continue to follow-up with Dr. [name].  If his LDL cholesterol increases because of this reduction, I would suggest that we try Motrin atorvastatin instead of simvastatin since it is stronger. 6. Obesity - he has gained 8 lbs since I last saw him.  Encouraged him to work on this   Current medicines are reviewed at length with the patient today.  The patient does not have concerns regarding  medicines.  The following changes have been made:  no change  Labs/ tests ordered today include:  No orders of the defined types were placed in this encounter.     Disposition:   FU with me in 6 months     Signed, Sreshta Cressler, [name], MD  11/19/2014 4:27 PM    [facility] [address], [place], [place]  [address] Phone: [phone]; Fax: [phone]

## 2014-11-23 ENCOUNTER — Ambulatory Visit (INDEPENDENT_AMBULATORY_CARE_PROVIDER_SITE_OTHER): Payer: Medicare Other | Admitting: Neurology

## 2014-11-23 ENCOUNTER — Encounter: Payer: Self-pay | Admitting: Neurology

## 2014-11-23 VITALS — BP 160/70 | HR 81 | Ht 72.0 in | Wt 293.0 lb

## 2014-11-23 DIAGNOSIS — G7000 Myasthenia gravis without (acute) exacerbation: Secondary | ICD-10-CM | POA: Diagnosis not present

## 2014-11-23 MED ORDER — PREDNISONE 5 MG PO TABS: ORAL_TABLET | ORAL | Status: DC

## 2014-11-23 NOTE — Patient Instructions (Signed)
1.  Decrease prednisone to 5mg  three times a week (on Monday, Wednesday and Friday) 2.  Call in 4 weeks with update 3.  Follow up in 3 months.

## 2014-11-23 NOTE — Progress Notes (Signed)
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  He is accompanied by his wife who provides some history.  UPDATE: He has been taking prednisone 5mg  daily.  He is doing well.  HISTORY: He developed left eyelid ptosis after knee surgery last November.  He denied any blurred vision or diplopia.  Ach binding antibodies were 19.4 and modulating antibodies were 86.  CT of chest revealed no abnormalities of thymus.  TSH 0.38.  He was started on Mestinon and developed loose stool on dose of 60mg  BID.  He has history of bradycardia and first degree heart black.  Palpitations worsened when started on Mestinon.  Dose was reduced to 30mg  BID and then once daily, which did improve his symptoms.  His cardiologist, Dr. Acie Fredrickson, discontinued the carvedilol to see if this contributed to the bradycardia.  He still was experiencing diarrhea on low-dose Mestinon, so it was discontinued and he was started on prednisone.  In September 2015, he suddenly he had some vision loss in the left half of his visual field.  When he closed either eye, he noted that the right eye was fine.  There was some associated dizziness.  It lasted for 2 minutes and resolved.  About a month before that, he began seeing brief flashes in the left part of his vision whenever he blinks.  He saw his ophthalmologist at that time who told him he had "floaters".  MRI of the brain without contrast was performed on 07/30/14, which revealed mild chronic small vessel ischemic changes but no acute intracranial abnormalities.  Carotid doppler revealed no significant plaque or stenosis.  LDL from 02/02/14 was 60.  Blood glucose level from 07/21/14 was 95.  He is on ASA.  PAST MEDICAL HISTORY: Past Medical History  Diagnosis Date  . Dysrhythmia   . High  blood pressure   . MG, ocular (myasthenia gravis)     ? of     MEDICATIONS: Current Outpatient Prescriptions on File Prior to Visit  Medication Sig Dispense Refill  . aspirin 81 MG tablet Take 81 mg by mouth daily.    . fish oil-omega-3 fatty acids 1000 MG capsule Take 1 g by mouth daily.    . hydrochlorothiazide (HYDRODIURIL) 25 MG tablet Take 1 tablet (25 mg total) by mouth daily. 30 tablet 3  . losartan (COZAAR) 100 MG tablet TAKE 1 TABLET (100 MG TOTAL) BY MOUTH DAILY. 90 tablet 3  . omeprazole (PRILOSEC) 20 MG capsule Take 1 capsule (20 mg total) by mouth daily. 90 capsule 2  . potassium chloride (K-DUR) 10 MEQ tablet Take 1 tablet (10 mEq total) by mouth daily. 30 tablet 3  . simvastatin (ZOCOR) 40 MG tablet Take 1 tablet (40 mg total) by mouth daily.     No current facility-administered medications on file prior to visit.    ALLERGIES: No Known Allergies  FAMILY HISTORY: Family History  Problem Relation Age of Onset  . Diabetes Brother   . Colon cancer Neg Hx   . Prostate cancer Neg Hx   . Stroke Neg Hx   . CAD Neg Hx     SOCIAL HISTORY: History   Social History  . Marital Status: Married    Spouse Name: N/A    Number of Children: 4  . Years of Education:  N/A   Occupational History  . [employer]-- retired 2010    Social History Main Topics  . Smoking status: Former Research scientist (life sciences)  . Smokeless tobacco: Never Used     Comment: Quit 46 yrs ago  . Alcohol Use: No     Comment: none  . Drug Use: No  . Sexual Activity:    Partners: Female   Other Topics Concern  . Not on file   Social History Narrative   married, lives w/ second wife, 2 living children, lost 2 kids      REVIEW OF SYSTEMS: Constitutional: No fevers, chills, or sweats, no generalized fatigue, change in appetite Eyes: No visual changes, double vision, eye pain Ear, nose and throat: No hearing loss, ear pain, nasal congestion, sore throat Cardiovascular: No chest pain,  palpitations Respiratory:  No shortness of breath at rest or with exertion, wheezes GastrointestinaI: No nausea, vomiting, diarrhea, abdominal pain, fecal incontinence Genitourinary:  No dysuria, urinary retention or frequency Musculoskeletal:  No neck pain, back pain Integumentary: No rash, pruritus, skin lesions Neurological: as above Psychiatric: No depression, insomnia, anxiety Endocrine: No palpitations, fatigue, diaphoresis, mood swings, change in appetite, change in weight, increased thirst Hematologic/Lymphatic:  No anemia, purpura, petechiae. Allergic/Immunologic: no itchy/runny eyes, nasal congestion, recent allergic reactions, rashes  PHYSICAL EXAM: Filed Vitals:   11/23/14 1105  BP: 160/70  Pulse: 81   General: No acute distress Head:  Normocephalic/atraumatic Eyes:  Fundoscopic exam unremarkable without vessel changes, exudates, hemorrhages or papilledema. Neck: supple, no paraspinal tenderness, full range of motion Heart:  Regular rate and rhythm Lungs:  Clear to auscultation bilaterally Back: No paraspinal tenderness Neurological Exam: alert and oriented to person, place, and time. Attention span and concentration intact, recent and remote memory intact, fund of knowledge intact.  Speech fluent and not dysarthric, language intact.  Left ptosis.  Otherwise CN II-XII intact. Fundoscopic exam unremarkable without vessel changes, exudates, hemorrhages or papilledema.  Bulk and tone normal, muscle strength 5/5 throughout.  Sensation to light touch, temperature and vibration intact.  Deep tendon reflexes 2+ throughout.  Finger to nose and heel to shin testing intact.  Gait normal  IMPRESSION: Left ocular myasthenia  PLAN: 1.  Decrease prednisone to 5mg  three times a week (Monday, Wednesday and Friday).  Call in 4 weeks with update. 2.  Follow up in 3 months  15 minutes spent with patient, over 50% spent discussing plan  Metta Clines, DO  CC:  [name], MD

## 2014-12-10 ENCOUNTER — Other Ambulatory Visit (INDEPENDENT_AMBULATORY_CARE_PROVIDER_SITE_OTHER): Payer: Medicare Other | Admitting: *Deleted

## 2014-12-10 DIAGNOSIS — I10 Essential (primary) hypertension: Secondary | ICD-10-CM

## 2014-12-10 LAB — BASIC METABOLIC PANEL
BUN: 13 mg/dL (ref 6–23)
CHLORIDE: 99 meq/L (ref 96–112)
CO2: 32 meq/L (ref 19–32)
Calcium: 9.1 mg/dL (ref 8.4–10.5)
Creatinine, Ser: 1.07 mg/dL (ref 0.40–1.50)
GFR: 72.12 mL/min (ref >60.00)
GLUCOSE: 130 mg/dL — AB (ref 70–99)
Potassium: 3.7 mEq/L (ref 3.5–5.1)
Sodium: 136 mEq/L (ref 135–145)

## 2015-01-26 DIAGNOSIS — L570 Actinic keratosis: Secondary | ICD-10-CM | POA: Diagnosis not present

## 2015-01-26 DIAGNOSIS — L578 Other skin changes due to chronic exposure to nonionizing radiation: Secondary | ICD-10-CM | POA: Diagnosis not present

## 2015-01-26 DIAGNOSIS — L821 Other seborrheic keratosis: Secondary | ICD-10-CM | POA: Diagnosis not present

## 2015-01-27 ENCOUNTER — Other Ambulatory Visit: Payer: Self-pay

## 2015-02-08 ENCOUNTER — Encounter: Payer: Self-pay | Admitting: Internal Medicine

## 2015-02-08 ENCOUNTER — Ambulatory Visit (INDEPENDENT_AMBULATORY_CARE_PROVIDER_SITE_OTHER): Payer: Medicare Other | Admitting: Internal Medicine

## 2015-02-08 VITALS — BP 124/82 | HR 49 | Temp 98.2°F | Ht 72.0 in | Wt 292.0 lb

## 2015-02-08 DIAGNOSIS — K219 Gastro-esophageal reflux disease without esophagitis: Secondary | ICD-10-CM | POA: Diagnosis not present

## 2015-02-08 DIAGNOSIS — E785 Hyperlipidemia, unspecified: Secondary | ICD-10-CM

## 2015-02-08 DIAGNOSIS — M159 Polyosteoarthritis, unspecified: Secondary | ICD-10-CM

## 2015-02-08 DIAGNOSIS — I10 Essential (primary) hypertension: Secondary | ICD-10-CM

## 2015-02-08 DIAGNOSIS — R739 Hyperglycemia, unspecified: Secondary | ICD-10-CM | POA: Diagnosis not present

## 2015-02-08 DIAGNOSIS — M150 Primary generalized (osteo)arthritis: Secondary | ICD-10-CM

## 2015-02-08 LAB — LIPID PANEL
CHOL/HDL RATIO: 4
Cholesterol: 159 mg/dL (ref 0–200)
HDL: 39.9 mg/dL (ref >39.00)
NONHDL: 119.1
Triglycerides: 284 mg/dL — ABNORMAL HIGH (ref 0.0–149.0)
VLDL: 56.8 mg/dL — ABNORMAL HIGH (ref 0.0–40.0)

## 2015-02-08 LAB — ALT: ALT: 20 U/L (ref 0–53)

## 2015-02-08 LAB — AST: AST: 17 U/L (ref 0–37)

## 2015-02-08 LAB — HEMOGLOBIN A1C: Hgb A1c MFr Bld: 6.3 % (ref 4.6–6.5)

## 2015-02-08 LAB — LDL CHOLESTEROL, DIRECT: Direct LDL: 85 mg/dL

## 2015-02-08 MED ORDER — LOSARTAN POTASSIUM 100 MG PO TABS: 100.0000 mg | ORAL_TABLET | Freq: Every day | ORAL | Status: DC

## 2015-02-08 MED ORDER — HYDROCHLOROTHIAZIDE 25 MG PO TABS: 25.0000 mg | ORAL_TABLET | Freq: Every day | ORAL | Status: DC

## 2015-02-08 MED ORDER — POTASSIUM CHLORIDE ER 10 MEQ PO TBCR: 10.0000 meq | EXTENDED_RELEASE_TABLET | Freq: Every day | ORAL | Status: DC

## 2015-02-08 NOTE — Assessment & Plan Note (Signed)
Aches and pains at baseline even after he decrease dose of simvastatin from 80 mg to 40 mg

## 2015-02-08 NOTE — Progress Notes (Signed)
Pre visit review using our clinic review tool, if applicable. No additional management support is needed unless otherwise documented below in the visit note. 

## 2015-02-08 NOTE — Assessment & Plan Note (Signed)
Refill medications

## 2015-02-08 NOTE — Assessment & Plan Note (Addendum)
Was seen by cardiology a couple of months ago, they recommend not to take > than  simvastatin 40 mg daily. Patient decided not to take niacin for now as he is taking a number of medicines. Had a long discussion about diet, exercise. He is doing actually okay with exercise, obesity is his main problem, we discussed a nutritionist referral, not interested at this time. Plan: Labs, if not at goal consider to switch simvastatin to another agent.

## 2015-02-08 NOTE — Assessment & Plan Note (Addendum)
HCTZ and potassium were added 10-2014, ambulatory BPs great, f/u bmp was normal. Plan: Refill medications

## 2015-02-08 NOTE — Patient Instructions (Signed)
Get your blood work before you leave   Come back to the office in 3-4 months  for a physical exam  Please schedule an appointment at the front desk    Come back fasting

## 2015-02-08 NOTE — Progress Notes (Signed)
Subjective:    Patient ID: [name] [name], male    DOB: 28-Apr-1941, 74 y.o.   MRN: [id number]  DOS:  02/08/2015 Type of visit - description : Routine office visit Interval history: Hypertension, cardiology added a diuretic 3 months ago, ambulatory BPs great. Myasthenia, neurology note reviewed, they are decreasing dramatically the prednisone dose. High cholesterol, cardiology note reviewed, they recommended simvastatin 40 mg, avoid higher doses. The patient decided not to take niacin for now. Obesity,  patient and his cardiologist concerned about his weight   Review of Systems  Denies chest pain or difficulty breathing No nausea, vomiting, diarrhea Has occasional aches and pains, baseline, reducing simvastatin dose to 40 mg did not change his aches  Past Medical History  Diagnosis Date  . Dysrhythmia   . High blood pressure   . MG, ocular (myasthenia gravis)     ? of     Past Surgical History  Procedure Laterality Date  . Hernia repair      bilateral inguinal hernia  . Hernia repair      umbilical  . Joint replacement  2009    right knee  . 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 Middle ear surgery Right     ear -patched hole in ear drum    History   Social History  . Marital Status: Married    Spouse Name: N/A  . Number of Children: 4  . Years of Education: N/A   Occupational History  . [employer]-- retired 2010    Social History Main Topics  . Smoking status: Former Research scientist (life sciences)  . Smokeless tobacco: Never Used     Comment: Quit 46 yrs ago  . Alcohol Use: No     Comment: none  . Drug Use: No  . Sexual Activity:    Partners: Female   Other Topics Concern  . Not on file   Social History Narrative   married, lives w/ second wife, 2 living children, lost 2 kids          Medication List       This list is accurate as  of: 02/08/15  9:17 PM.  Always use your most recent med list.               aspirin 81 MG tablet  Take 81 mg by mouth daily.     fish oil-omega-3 fatty acids 1000 MG capsule  Take 1 g by mouth daily.     hydrochlorothiazide 25 MG tablet  Commonly known as:  HYDRODIURIL  Take 1 tablet (25 mg total) by mouth daily.     losartan 100 MG tablet  Commonly known as:  COZAAR  Take 1 tablet (100 mg total) by mouth daily.     omeprazole 20 MG capsule  Commonly known as:  PRILOSEC  Take 1 capsule (20 mg total) by mouth daily.     potassium chloride 10 MEQ tablet  Commonly known as:  K-DUR  Take 1 tablet (10 mEq total) by mouth daily.     predniSONE 5 MG tablet  Commonly known as:  DELTASONE  Take 1tab three times a week (Monday, Wednesday, Friday)     simvastatin 40 MG tablet  Commonly known as:  ZOCOR  Take 1 tablet (40 mg total) by mouth daily.           Objective:  Physical Exam BP 124/82 mmHg  Pulse 49  Temp(Src) 98.2 F (36.8 C) (Oral)  Ht 6' (1.829 m)  Wt 292 lb (132.45 kg)  BMI 39.59 kg/m2  SpO2 97% General:   Well developed, well nourished . NAD.  HEENT:  Normocephalic . Face symmetric, atraumatic Lungs:  CTA B Normal respiratory effort, no intercostal retractions, no accessory muscle use. Heart: RRR,  no murmur.  Muscle skeletal: no pretibial edema bilaterally  Skin: Not pale. Not jaundice Neurologic:  alert & oriented X3.  Speech normal, gait appropriate for age and unassisted Psych--  Cognition and judgment appear intact.  Cooperative with normal attention span and concentration.  Behavior appropriate. No anxious or depressed appearing.        Assessment & Plan:

## 2015-02-24 ENCOUNTER — Ambulatory Visit (INDEPENDENT_AMBULATORY_CARE_PROVIDER_SITE_OTHER): Payer: Medicare Other | Admitting: Neurology

## 2015-02-24 ENCOUNTER — Encounter: Payer: Self-pay | Admitting: Neurology

## 2015-02-24 ENCOUNTER — Ambulatory Visit: Payer: Medicare Other | Admitting: Neurology

## 2015-02-24 VITALS — BP 142/68 | HR 70 | Resp 18 | Ht 72.0 in | Wt 297.2 lb

## 2015-02-24 DIAGNOSIS — G7000 Myasthenia gravis without (acute) exacerbation: Secondary | ICD-10-CM | POA: Diagnosis not present

## 2015-02-24 MED ORDER — PREDNISONE 1 MG PO TABS: ORAL_TABLET | ORAL | Status: DC

## 2015-02-24 NOTE — Progress Notes (Addendum)
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  He is accompanied by his wife, who provides some history.  UPDATE: He has slowly been tapering off prednisone and currently is taking 5mg  three times a week.  He is doing well.  He has gained some more weight.  HISTORY: He developed left eyelid ptosis after knee surgery last November.  He denied any blurred vision or diplopia.  Ach binding antibodies were 19.4 and modulating antibodies were 86.  CT of chest revealed no abnormalities of thymus.  TSH 0.38.  He was started on Mestinon and developed loose stool on dose of 60mg  BID.  He has history of bradycardia and first degree heart black.  Palpitations worsened when started on Mestinon.  Dose was reduced to 30mg  BID and then once daily, which did improve his symptoms.  His cardiologist, Dr. Acie Fredrickson, discontinued the carvedilol to see if this contributed to the bradycardia.  He still was experiencing diarrhea on low-dose Mestinon, so it was discontinued and he was started on prednisone.  In September 2015, he suddenly he had some vision loss in the left half of his visual field.  When he closed either eye, he noted that the right eye was fine.  There was some associated dizziness.  It lasted for 2 minutes and resolved.  About a month before that, he began seeing brief flashes in the left part of his vision whenever he blinks.  He saw his ophthalmologist at that time who told him he had "floaters".  MRI of the brain without contrast was performed on 07/30/14, which revealed mild chronic small vessel ischemic changes but no acute intracranial abnormalities.  Carotid doppler revealed no significant plaque or stenosis.  LDL from 02/02/14 was 60.  Blood glucose level from 07/21/14 was 95.  He is on  ASA.  PAST MEDICAL HISTORY: Past Medical History  Diagnosis Date  . Dysrhythmia   . High blood pressure   . MG, ocular (myasthenia gravis)     ? of     MEDICATIONS: Current Outpatient Prescriptions on File Prior to Visit  Medication Sig Dispense Refill  . aspirin 81 MG tablet Take 81 mg by mouth daily.    . fish oil-omega-3 fatty acids 1000 MG capsule Take 1 g by mouth daily.    . hydrochlorothiazide (HYDRODIURIL) 25 MG tablet Take 1 tablet (25 mg total) by mouth daily. 90 tablet 2  . losartan (COZAAR) 100 MG tablet Take 1 tablet (100 mg total) by mouth daily. 90 tablet 2  . omeprazole (PRILOSEC) 20 MG capsule Take 1 capsule (20 mg total) by mouth daily. 90 capsule 2  . potassium chloride (K-DUR) 10 MEQ tablet Take 1 tablet (10 mEq total) by mouth daily. 90 tablet 2  . simvastatin (ZOCOR) 40 MG tablet Take 1 tablet (40 mg total) by mouth daily.     No current facility-administered medications on file prior to visit.    ALLERGIES: No Known Allergies  FAMILY HISTORY: Family History  Problem Relation Age of Onset  . Diabetes Brother   . Colon cancer Neg Hx   . Prostate cancer Neg Hx   . Stroke Neg Hx   . CAD Neg Hx     SOCIAL HISTORY: History   Social History  . Marital Status: Married  Spouse Name: N/A  . Number of Children: 4  . Years of Education: N/A   Occupational History  . [employer]-- retired 2010    Social History Main Topics  . Smoking status: Former Research scientist (life sciences)  . Smokeless tobacco: Never Used     Comment: Quit 46 yrs ago  . Alcohol Use: No     Comment: none  . Drug Use: No  . Sexual Activity:    Partners: Female   Other Topics Concern  . Not on file   Social History Narrative   married, lives w/ second wife, 2 living children, lost 2 kids      REVIEW OF SYSTEMS: Constitutional: No fevers, chills, or sweats, no generalized fatigue, change in appetite Eyes: No visual changes, double vision, eye pain Ear, nose and throat: No hearing loss,  ear pain, nasal congestion, sore throat Cardiovascular: No chest pain, palpitations Respiratory:  No shortness of breath at rest or with exertion, wheezes GastrointestinaI: No nausea, vomiting, diarrhea, abdominal pain, fecal incontinence Genitourinary:  No dysuria, urinary retention or frequency Musculoskeletal:  No neck pain, back pain Integumentary: No rash, pruritus, skin lesions Neurological: as above Psychiatric: No depression, insomnia, anxiety Endocrine: No palpitations, fatigue, diaphoresis, mood swings, change in appetite, change in weight, increased thirst Hematologic/Lymphatic:  No anemia, purpura, petechiae. Allergic/Immunologic: no itchy/runny eyes, nasal congestion, recent allergic reactions, rashes  PHYSICAL EXAM: Filed Vitals:   02/24/15 0932  BP: 142/68  Pulse: 70  Resp: 18   General: No acute distress Head:  Normocephalic/atraumatic Eyes:  Fundoscopic exam unremarkable without vessel changes, exudates, hemorrhages or papilledema. Neck: supple, no paraspinal tenderness, full range of motion Heart:  Regular rate and rhythm Lungs:  Clear to auscultation bilaterally Back: No paraspinal tenderness Neurological Exam: alert and oriented to person, place, and time. Attention span and concentration intact, recent and remote memory intact, fund of knowledge intact.  Speech fluent and not dysarthric, language intact.  Left ptosis.  Otherwise CN II-XII intact. Fundoscopic exam unremarkable without vessel changes, exudates, hemorrhages or papilledema.  Bulk and tone normal, muscle strength 5/5 throughout.  Sensation to light touch, temperature and vibration intact.  Deep tendon reflexes 2+ throughout.  Finger to nose testing intact.  Gait normal.  IMPRESSION: Left ocular myasthenia Morbid obesity  PLAN: Decrease prednisone to 4mg  three times a week.  He is to call in 1 months for refill, decreased to 3mg  three times a week He was advised to call us immediately if he starts to  feel increased weakness, shortness of breath, dysphagia or double vision. Discussed weight loss Follow up in 4 months  15 minutes spent with patient, over 50% spent discussing plan.  Metta Clines, DO  CC: [name], MD

## 2015-02-24 NOTE — Patient Instructions (Signed)
I will now prescribe you 1mg  tablets of prednisone.  Take 4 tablets daily three times a week (on Monday, Wednesday and Friday).  In 4 weeks, call for a refill and we can prescribe a lower dose in order to get you off of the prednisone Try working on weight loss, as we discussed.

## 2015-03-02 ENCOUNTER — Ambulatory Visit: Payer: Medicare Other | Admitting: Neurology

## 2015-03-30 ENCOUNTER — Other Ambulatory Visit: Payer: Self-pay | Admitting: Neurology

## 2015-03-31 NOTE — Telephone Encounter (Signed)
Decrease dose of prednisone 1mg  to 3 tablets three times a week (Monday, Wednesday, and Friday) for 4 weeks, and then we will decrease dose further.

## 2015-03-31 NOTE — Telephone Encounter (Signed)
Please advise is this ok to refill? °

## 2015-05-05 ENCOUNTER — Other Ambulatory Visit: Payer: Self-pay | Admitting: Neurology

## 2015-05-13 ENCOUNTER — Other Ambulatory Visit: Payer: Self-pay | Admitting: Internal Medicine

## 2015-06-09 ENCOUNTER — Telehealth: Payer: Self-pay | Admitting: Internal Medicine

## 2015-06-09 NOTE — Telephone Encounter (Signed)
pre visit letter mailed 06/02/15  °

## 2015-06-11 DIAGNOSIS — R52 Pain, unspecified: Secondary | ICD-10-CM | POA: Diagnosis not present

## 2015-06-11 DIAGNOSIS — M79602 Pain in left arm: Secondary | ICD-10-CM | POA: Diagnosis not present

## 2015-06-20 ENCOUNTER — Other Ambulatory Visit: Payer: Self-pay | Admitting: Neurology

## 2015-06-23 ENCOUNTER — Encounter: Payer: Self-pay | Admitting: Internal Medicine

## 2015-06-23 ENCOUNTER — Ambulatory Visit (HOSPITAL_BASED_OUTPATIENT_CLINIC_OR_DEPARTMENT_OTHER)
Admission: RE | Admit: 2015-06-23 | Discharge: 2015-06-23 | Disposition: A | Discharge status: Home / Self Care | Payer: Medicare Other | Source: Ambulatory Visit | Attending: Internal Medicine | Admitting: Internal Medicine

## 2015-06-23 ENCOUNTER — Ambulatory Visit (INDEPENDENT_AMBULATORY_CARE_PROVIDER_SITE_OTHER): Payer: Medicare Other | Admitting: Internal Medicine

## 2015-06-23 VITALS — BP 120/62 | HR 59 | Temp 97.9°F | Ht 72.0 in | Wt 296.0 lb

## 2015-06-23 DIAGNOSIS — Z09 Encounter for follow-up examination after completed treatment for conditions other than malignant neoplasm: Secondary | ICD-10-CM | POA: Insufficient documentation

## 2015-06-23 DIAGNOSIS — R7309 Other abnormal glucose: Secondary | ICD-10-CM | POA: Diagnosis not present

## 2015-06-23 DIAGNOSIS — Z23 Encounter for immunization: Secondary | ICD-10-CM | POA: Diagnosis not present

## 2015-06-23 DIAGNOSIS — I10 Essential (primary) hypertension: Secondary | ICD-10-CM

## 2015-06-23 DIAGNOSIS — M7732 Calcaneal spur, left foot: Secondary | ICD-10-CM | POA: Diagnosis not present

## 2015-06-23 DIAGNOSIS — E785 Hyperlipidemia, unspecified: Secondary | ICD-10-CM | POA: Diagnosis not present

## 2015-06-23 DIAGNOSIS — W1789XA Other fall from one level to another, initial encounter: Secondary | ICD-10-CM | POA: Insufficient documentation

## 2015-06-23 DIAGNOSIS — Z0000 Encounter for general adult medical examination without abnormal findings: Secondary | ICD-10-CM | POA: Diagnosis not present

## 2015-06-23 DIAGNOSIS — R7303 Prediabetes: Secondary | ICD-10-CM

## 2015-06-23 DIAGNOSIS — S99929A Unspecified injury of unspecified foot, initial encounter: Secondary | ICD-10-CM | POA: Diagnosis not present

## 2015-06-23 DIAGNOSIS — S99822A Other specified injuries of left foot, initial encounter: Secondary | ICD-10-CM | POA: Diagnosis not present

## 2015-06-23 DIAGNOSIS — S99922A Unspecified injury of left foot, initial encounter: Secondary | ICD-10-CM | POA: Diagnosis not present

## 2015-06-23 LAB — LIPID PANEL
CHOLESTEROL: 152 mg/dL (ref 0–200)
HDL: 37 mg/dL — ABNORMAL LOW (ref >39.00)
NONHDL: 115.28
TRIGLYCERIDES: 208 mg/dL — AB (ref 0.0–149.0)
Total CHOL/HDL Ratio: 4
VLDL: 41.6 mg/dL — ABNORMAL HIGH (ref 0.0–40.0)

## 2015-06-23 LAB — HEMOGLOBIN A1C: Hgb A1c MFr Bld: 6.4 % (ref 4.6–6.5)

## 2015-06-23 LAB — BASIC METABOLIC PANEL
BUN: 16 mg/dL (ref 6–23)
CALCIUM: 9 mg/dL (ref 8.4–10.5)
CO2: 30 meq/L (ref 19–32)
CREATININE: 1.08 mg/dL (ref 0.40–1.50)
Chloride: 101 mEq/L (ref 96–112)
GFR: 71.24 mL/min (ref >60.00)
Glucose, Bld: 109 mg/dL — ABNORMAL HIGH (ref 70–99)
Potassium: 3.8 mEq/L (ref 3.5–5.1)
Sodium: 140 mEq/L (ref 135–145)

## 2015-06-23 LAB — HM DIABETES FOOT EXAM: HM Diabetic Foot Exam: NORMAL

## 2015-06-23 LAB — LDL CHOLESTEROL, DIRECT: Direct LDL: 81 mg/dL

## 2015-06-23 NOTE — Progress Notes (Signed)
Pre visit review using our clinic review tool, if applicable. No additional management support is needed unless otherwise documented below in the visit note. 

## 2015-06-23 NOTE — Progress Notes (Signed)
Subjective:    Patient ID: [name] [name], male    DOB: 1941/04/02, 74 y.o.   MRN: [id number]  DOS:  06/23/2015 Type of visit - description :  Here for Medicare AWV:  1. Risk factors based on Past M, S, F history: reviewed 2. Physical Activities:  Walks-stationary bike most day 3. Depression/mood: neg screening  4. Hearing: R hear decreased, declines a referral, not interfering w/ ADL 5. ADL's: independent, drives  6. Fall Risk: no recent falls, prevention discussed , see AVS 7. home Safety: does feel safe at home  8. Height, weight, & visual acuity: see VS,  Needs to check his vision--- referral enter  9. Counseling: provided 10. Labs ordered based on risk factors: if needed  11. Referral Coordination: if needed 12. Care Plan, see assessment and plan , written personalized plan provided , see AVS 13. Cognitive Assessment: motor skills and cognition appropriate for age 42. Care team updated  15. End-of-life care, has a HC-POA  In addition, today we discussed the following: High cholesterol: Good compliance with simvastatin Hypertension, good compliance with medication, BP today is very good Myasthenia gravis: Follow-up by neurology, still on prednisone, low dose. Prediabetes: Dx discussed with the patient. Second left toe was injured 6 days ago, it bended downward, the patient "set it  back up". Now has minimal pain and swelling.   Review of Systems Constitutional: No fever. No chills. No unexplained wt changes. No unusual sweats  HEENT: + dental problems, apparently needs a crown, already has an appointment to see the dentist; no ear discharge, no facial swelling, no voice changes. No eye discharge, no eye  redness , no  intolerance to light   Respiratory: No wheezing , no  difficulty breathing. No cough , no mucus production  Cardiovascular: No CP, no leg swelling , no  Palpitations  GI: no nausea, no vomiting, no diarrhea , no  abdominal pain.  No blood in the stools. No  dysphagia, no odynophagia    Endocrine: No polyphagia, no polyuria , no polydipsia  GU: No dysuria, gross hematuria, difficulty urinating. No urinary urgency, no frequency.  Musculoskeletal: No joint swellings or unusual aches or pains  Skin: No change in the color of the skin, palor , no  Rash  Allergic, immunologic: No environmental allergies , no  food allergies  Neurological: No dizziness no  syncope. No headaches. No diplopia, no slurred, no slurred speech, no motor deficits, no facial  Numbness  Hematological: No enlarged lymph nodes, no easy bruising , no unusual bleedings  Psychiatry: No suicidal ideas, no hallucinations, no beavior problems, no confusion.  No unusual/severe anxiety, no depression   Past Medical History  Diagnosis Date  . Dysrhythmia   . High blood pressure   . MG, ocular (myasthenia gravis)     ? of     Past Surgical History  Procedure Laterality Date  . Hernia repair      bilateral inguinal hernia  . Hernia repair      umbilical  . Joint replacement  2009    right knee  . 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 Middle ear surgery Right     ear -patched hole in ear drum    Social History   Social History  . Marital Status: Married    Spouse Name: N/A  . Number of Children:  4  . Years of Education: N/A   Occupational History  . [employer]-- retired 2010    Social History Main Topics  . Smoking status: Former Research scientist (life sciences)  . Smokeless tobacco: Never Used     Comment: Quit 46 yrs ago  . Alcohol Use: No     Comment: none  . Drug Use: No  . Sexual Activity:    Partners: Female   Other Topics Concern  . Not on file   Social History Narrative   married, lives w/ second wife, 2 living children, lost 2 kids       Family History  Problem Relation Age of Onset  . Diabetes Brother   . Colon cancer Neg Hx    . Prostate cancer Neg Hx   . Stroke Neg Hx   . CAD Neg Hx        Medication List       This list is accurate as of: 06/23/15  6:54 PM.  Always use your most recent med list.               aspirin 81 MG tablet  Take 81 mg by mouth daily.     fish oil-omega-3 fatty acids 1000 MG capsule  Take 1 g by mouth daily.     hydrochlorothiazide 25 MG tablet  Commonly known as:  HYDRODIURIL  Take 1 tablet (25 mg total) by mouth daily.     losartan 100 MG tablet  Commonly known as:  COZAAR  Take 1 tablet (100 mg total) by mouth daily.     omeprazole 20 MG capsule  Commonly known as:  PRILOSEC  Take 1 capsule (20 mg total) by mouth daily.     potassium chloride 10 MEQ tablet  Commonly known as:  K-DUR  Take 1 tablet (10 mEq total) by mouth daily.     predniSONE 1 MG tablet  Commonly known as:  DELTASONE  TAKE 3 TABLETS (3 MG TOTAL) BY MOUTH 3 (THREE) TIMES A WEEK. (MONDAY, WEDNESDAY, FRIDAY)     simvastatin 40 MG tablet  Commonly known as:  ZOCOR  Take 1 tablet (40 mg total) by mouth daily.           Objective:   Physical Exam BP 120/62 mmHg  Pulse 59  Temp(Src) 97.9 F (36.6 C) (Oral)  Ht 6' (1.829 m)  Wt 296 lb (134.265 kg)  BMI 40.14 kg/m2  SpO2 97% General:   Well developed, well nourished . NAD.  HEENT:  Normocephalic . Face symmetric, atraumatic Neck: No thyromegaly, good carotid pulses. Lungs:  CTA B Normal respiratory effort, no intercostal retractions, no accessory muscle use. Heart: RRR,  no murmur.  no pretibial edema bilaterally  Abdomen:  Not distended, soft, non-tender. No rebound or rigidity. No mass,organomegaly Skin: Not pale. Not jaundice Diabetic feet exam: Normal skin, good pedal pulses, normal pinprick examination 2nd  Left toe: Slightly swollen, slightly TTP. No red, warm. No deformities Neurologic:  alert & oriented X3.  Speech normal, gait appropriate for age and unassisted Psych--  Cognition and judgment appear intact.    Cooperative with normal attention span and concentration.  Behavior appropriate. No anxious or depressed appearing.    Assessment & Plan:   Prediabetes: A1c 01-2015 was 6.3. Concept of A1c of prediabetes discussed with the patient. Feet exam normal today, recheck the A1c. Myasthenia gravis: Sees neurology, on low dose of prednisone High cholesterol: On simvastatin 40 mg, check labs, if not at goal will consider switch to  another statin Toe injury: Recommend buddy tape, get x-ray. Hypertension: Continue with losartan, HCTZ, potassium supplements. Check a BMP Obesity: Diet and exercise discussed   Next visit in 6 months.

## 2015-06-23 NOTE — Assessment & Plan Note (Addendum)
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 Reassess next year  He remains active, has not been able to lose weight, diet is essentially unchanged

## 2015-06-23 NOTE — Assessment & Plan Note (Signed)
Prediabetes: A1c 01-2015 was 6.3. Concept of A1c of prediabetes discussed with the patient. Feet exam normal today, recheck the A1c. Myasthenia gravis: Sees neurology, on low dose of prednisone High cholesterol: On simvastatin 40 mg, check labs, if not at goal will consider switch to another statin Toe injury: Recommend buddy tape, get x-ray. Hypertension: Continue with losartan, HCTZ, potassium supplements. Check a BMP Obesity: Diet and exercise discussed

## 2015-06-23 NOTE — Patient Instructions (Addendum)
Get your blood work before you leave     Check the  blood pressure 2 or 3 times a month Be sure your blood pressure is between 110/65 and  145/85.  if it is consistently higher or lower, let me know   Next visit  for a  routine visit in 6 months  Please schedule an appointment at the front desk No need to come back fasting      Fall Prevention and Home Safety Falls cause injuries and can affect all age groups. It is possible to use preventive measures to significantly decrease the likelihood of falls. There are many simple measures which can make your home safer and prevent falls. OUTDOORS  Repair cracks and edges of walkways and driveways.  Remove high doorway thresholds.  Trim shrubbery on the main path into your home.  Have good outside lighting.  Clear walkways of tools, rocks, debris, and clutter.  Check that handrails are not broken and are securely fastened. Both sides of steps should have handrails.  Have leaves, snow, and ice cleared regularly.  Use sand or salt on walkways during winter months.  In the garage, clean up grease or oil spills. BATHROOM  Install night lights.  Install grab bars by the toilet and in the tub and shower.  Use non-skid mats or decals in the tub or shower.  Place a plastic non-slip stool in the shower to sit on, if needed.  Keep floors dry and clean up all water on the floor immediately.  Remove soap buildup in the tub or shower on a regular basis.  Secure bath mats with non-slip, double-sided rug tape.  Remove throw rugs and tripping hazards from the floors. BEDROOMS  Install night lights.  Make sure a bedside light is easy to reach.  Do not use oversized bedding.  Keep a telephone by your bedside.  Have a firm chair with side arms to use for getting dressed.  Remove throw rugs and tripping hazards from the floor. KITCHEN  Keep handles on pots and pans turned toward the center of the stove. Use back burners when  possible.  Clean up spills quickly and allow time for drying.  Avoid walking on wet floors.  Avoid hot utensils and knives.  Position shelves so they are not too high or low.  Place commonly used objects within easy reach.  If necessary, use a sturdy step stool with a grab bar when reaching.  Keep electrical cables out of the way.  Do not use floor polish or wax that makes floors slippery. If you must use wax, use non-skid floor wax.  Remove throw rugs and tripping hazards from the floor. STAIRWAYS  Never leave objects on stairs.  Place handrails on both sides of stairways and use them. Fix any loose handrails. Make sure handrails on both sides of the stairways are as long as the stairs.  Check carpeting to make sure it is firmly attached along stairs. Make repairs to worn or loose carpet promptly.  Avoid placing throw rugs at the top or bottom of stairways, or properly secure the rug with carpet tape to prevent slippage. Get rid of throw rugs, if possible.  Have an electrician put in a light switch at the top and bottom of the stairs. OTHER FALL PREVENTION TIPS  Wear low-heel or rubber-soled shoes that are supportive and fit well. Wear closed toe shoes.  When using a stepladder, make sure it is fully opened and both spreaders are firmly locked. Do  not climb a closed stepladder.  Add color or contrast paint or tape to grab bars and handrails in your home. Place contrasting color strips on first and last steps.  Learn and use mobility aids as needed. Install an electrical emergency response system.  Turn on lights to avoid dark areas. Replace light bulbs that burn out immediately. Get light switches that glow.  Arrange furniture to create clear pathways. Keep furniture in the same place.  Firmly attach carpet with non-skid or double-sided tape.  Eliminate uneven floor surfaces.  Select a carpet pattern that does not visually hide the edge of steps.  Be aware of all  pets. OTHER HOME SAFETY TIPS  Set the water temperature for 120 F (48.8 C).  Keep emergency numbers on or near the telephone.  Keep smoke detectors on every level of the home and near sleeping areas. Document Released: 09/29/2002 Document Revised: 04/09/2012 Document Reviewed: 12/29/2011 [facility] Patient Information 2015 [place], [place]. This information is not intended to replace advice given to you by your health care provider. Make sure you discuss any questions you have with your health care provider.   Preventive Care for Adults Ages 27 and over  Blood pressure check.** / Every 1 to 2 years.  Lipid and cholesterol check.**/ Every 5 years beginning at age 41.  Lung cancer screening. / Every year if you are aged 71-80 years and have a 30-pack-year history of smoking and currently smoke or have quit within the past 15 years. Yearly screening is stopped once you have quit smoking for at least 15 years or develop a health problem that would prevent you from having lung cancer treatment.  Fecal occult blood test (FOBT) of stool. / Every year beginning at age 24 and continuing until age 41. You may not have to do this test if you get a colonoscopy every 10 years.  Flexible sigmoidoscopy** or colonoscopy.** / Every 5 years for a flexible sigmoidoscopy or every 10 years for a colonoscopy beginning at age 90 and continuing until age 79.  Hepatitis C blood test.** / For all people born from [birth year] through 1964 and any individual with known risks for hepatitis C.  Abdominal aortic aneurysm (AAA) screening.** / A one-time screening for ages 49 to 47 years who are current or former smokers.  Skin self-exam. / Monthly.  Influenza vaccine. / Every year.  Tetanus, diphtheria, and acellular pertussis (Tdap/Td) vaccine.** / 1 dose of Td every 10 years.  Varicella vaccine.** / Consult your health care provider.  Zoster vaccine.** / 1 dose for adults aged 20 years or older.  Pneumococcal  13-valent conjugate (PCV13) vaccine.** / Consult your health care provider.  Pneumococcal polysaccharide (PPSV23) vaccine.** / 1 dose for all adults aged 66 years and older.  Meningococcal vaccine.** / Consult your health care provider.  Hepatitis A vaccine.** / Consult your health care provider.  Hepatitis B vaccine.** / Consult your health care provider.  Haemophilus influenzae type b (Hib) vaccine.** / Consult your health care provider. **Family history and personal history of risk and conditions may change your health care provider's recommendations. Document Released: 12/05/2001 Document Revised: 10/14/2013 Document Reviewed: 03/06/2011 [facility] Patient Information 2015 [place], [place]. This information is not intended to replace advice given to you by your health care provider. Make sure you discuss any questions you have with your health care provider.

## 2015-06-24 ENCOUNTER — Encounter: Payer: Self-pay | Admitting: Internal Medicine

## 2015-06-25 ENCOUNTER — Ambulatory Visit (INDEPENDENT_AMBULATORY_CARE_PROVIDER_SITE_OTHER): Payer: Medicare Other | Admitting: Neurology

## 2015-06-25 ENCOUNTER — Encounter: Payer: Self-pay | Admitting: Neurology

## 2015-06-25 VITALS — BP 128/70 | HR 70 | Resp 20 | Ht 72.0 in | Wt 296.2 lb

## 2015-06-25 DIAGNOSIS — G7000 Myasthenia gravis without (acute) exacerbation: Secondary | ICD-10-CM | POA: Diagnosis not present

## 2015-06-25 MED ORDER — PREDNISONE 1 MG PO TABS: ORAL_TABLET | ORAL | Status: DC

## 2015-06-25 NOTE — Progress Notes (Signed)
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  He is accompanied by his wife, who provides some history.  UPDATE: He has slowly been tapering off prednisone.  He currently is taking 2mg  three times a week.  He is doing well.  He denies neck weakness, dysphagia, or shortness of breath.  HISTORY: He developed left eyelid ptosis after knee surgery last November.  He denied any blurred vision or diplopia.  Ach binding antibodies were 19.4 and modulating antibodies were 86.  CT of chest revealed no abnormalities of thymus.  TSH 0.38.  He was started on Mestinon and developed loose stool on dose of 60mg  BID.  He has history of bradycardia and first degree heart black.  Palpitations worsened when started on Mestinon.  Dose was reduced to 30mg  BID and then once daily, which did improve his symptoms.  His cardiologist, Dr. Acie Fredrickson, discontinued the carvedilol to see if this contributed to the bradycardia.  He still was experiencing diarrhea on low-dose Mestinon, so it was discontinued and he was started on prednisone.  In September 2015, he suddenly he had some vision loss in the left half of his visual field.  When he closed either eye, he noted that the right eye was fine.  There was some associated dizziness.  It lasted for 2 minutes and resolved.  About a month before that, he began seeing brief flashes in the left part of his vision whenever he blinks.  He saw his ophthalmologist at that time who told him he had "floaters".  MRI of the brain without contrast was performed on 07/30/14, which revealed mild chronic small vessel ischemic changes but no acute intracranial abnormalities.  Carotid doppler revealed no significant plaque or stenosis.  LDL from 02/02/14 was 60.  Blood glucose  level from 07/21/14 was 95.  He is on ASA.  PAST MEDICAL HISTORY: Past Medical History  Diagnosis Date  . Dysrhythmia   . High blood pressure   . MG, ocular (myasthenia gravis)     ? of     MEDICATIONS: Current Outpatient Prescriptions on File Prior to Visit  Medication Sig Dispense Refill  . aspirin 81 MG tablet Take 81 mg by mouth daily.    . fish oil-omega-3 fatty acids 1000 MG capsule Take 1 g by mouth daily.    . hydrochlorothiazide (HYDRODIURIL) 25 MG tablet Take 1 tablet (25 mg total) by mouth daily. 90 tablet 2  . losartan (COZAAR) 100 MG tablet Take 1 tablet (100 mg total) by mouth daily. 90 tablet 2  . omeprazole (PRILOSEC) 20 MG capsule Take 1 capsule (20 mg total) by mouth daily. 90 capsule 2  . potassium chloride (K-DUR) 10 MEQ tablet Take 1 tablet (10 mEq total) by mouth daily. 90 tablet 2  . simvastatin (ZOCOR) 40 MG tablet Take 1 tablet (40 mg total) by mouth daily.     No current facility-administered medications on file prior to visit.    ALLERGIES: No Known Allergies  FAMILY HISTORY: Family History  Problem Relation Age of Onset  . Diabetes Brother   . Colon cancer Neg Hx   . Prostate cancer Neg Hx   . Stroke Neg Hx   . CAD Neg Hx     SOCIAL HISTORY: Social History   Social  History  . Marital Status: Married    Spouse Name: N/A  . Number of Children: 4  . Years of Education: N/A   Occupational History  . [employer]-- retired 2010    Social History Main Topics  . Smoking status: Former Research scientist (life sciences)  . Smokeless tobacco: Never Used     Comment: Quit 46 yrs ago  . Alcohol Use: No     Comment: none  . Drug Use: No  . Sexual Activity:    Partners: Female   Other Topics Concern  . Not on file   Social History Narrative   married, lives w/ second wife, 2 living children, lost 2 kids      REVIEW OF SYSTEMS: Constitutional: No fevers, chills, or sweats, no generalized fatigue, change in appetite Eyes: No visual changes, double vision, eye  pain Ear, nose and throat: No hearing loss, ear pain, nasal congestion, sore throat Cardiovascular: No chest pain, palpitations Respiratory:  No shortness of breath at rest or with exertion, wheezes GastrointestinaI: No nausea, vomiting, diarrhea, abdominal pain, fecal incontinence Genitourinary:  No dysuria, urinary retention or frequency Musculoskeletal:  No neck pain, back pain Integumentary: No rash, pruritus, skin lesions Neurological: as above Psychiatric: No depression, insomnia, anxiety Endocrine: No palpitations, fatigue, diaphoresis, mood swings, change in appetite, change in weight, increased thirst Hematologic/Lymphatic:  No anemia, purpura, petechiae. Allergic/Immunologic: no itchy/runny eyes, nasal congestion, recent allergic reactions, rashes  PHYSICAL EXAM: Filed Vitals:   06/25/15 0920  BP: 128/70  Pulse: 70  Resp: 20   General: No acute distress.  Patient appears well-groomed.   Head:  Normocephalic/atraumatic Eyes:  Fundoscopic exam unremarkable without vessel changes, exudates, hemorrhages or papilledema. Neck: supple, no paraspinal tenderness, full range of motion Heart:  Regular rate and rhythm Lungs:  Clear to auscultation bilaterally Back: No paraspinal tenderness Neurological Exam: alert and oriented to person, place, and time. Attention span and concentration intact, recent and remote memory intact, fund of knowledge intact.  Speech fluent and not dysarthric, language intact.  Left ptosis.  Otherwise CN II-XII intact. Fundoscopic exam unremarkable without vessel changes, exudates, hemorrhages or papilledema.  Bulk and tone normal, muscle strength 5/5 throughout, including neck flexors/extensors.  Sensation to light touch  intact.  Deep tendon reflexes 2+ throughout.  Finger to nose testing intact.  Gait normal.  IMPRESSION: Left ocular myasthenia  PLAN: Decrease prednisone to 1mg  three times a week and call in 4 weeks for adjusted refill. Follow up in 3  months. Weight loss  15 minutes spent face to face with patient, over 50% spent discussing management.  Metta Clines, DO  CC:  [name], MD

## 2015-06-25 NOTE — Patient Instructions (Signed)
Take 1 tablet three times a week (Such as 1 tablet on Monday, Wednesday and Friday) Call in 4 weeks for adjusted refill

## 2015-06-26 ENCOUNTER — Other Ambulatory Visit: Payer: Self-pay | Admitting: Neurology

## 2015-06-29 ENCOUNTER — Ambulatory Visit: Payer: Medicare Other | Admitting: Neurology

## 2015-06-29 ENCOUNTER — Other Ambulatory Visit: Payer: Self-pay | Admitting: *Deleted

## 2015-06-29 DIAGNOSIS — K219 Gastro-esophageal reflux disease without esophagitis: Secondary | ICD-10-CM

## 2015-06-29 MED ORDER — OMEPRAZOLE 20 MG PO CPDR: 20.0000 mg | DELAYED_RELEASE_CAPSULE | Freq: Every day | ORAL | Status: DC

## 2015-07-19 DIAGNOSIS — H524 Presbyopia: Secondary | ICD-10-CM | POA: Diagnosis not present

## 2015-09-04 ENCOUNTER — Other Ambulatory Visit: Payer: Self-pay | Admitting: Internal Medicine

## 2015-09-06 ENCOUNTER — Other Ambulatory Visit: Payer: Self-pay | Admitting: Gastroenterology

## 2015-09-06 DIAGNOSIS — Z86010 Personal history of colonic polyps: Secondary | ICD-10-CM | POA: Diagnosis not present

## 2015-09-06 DIAGNOSIS — D126 Benign neoplasm of colon, unspecified: Secondary | ICD-10-CM | POA: Diagnosis not present

## 2015-09-06 DIAGNOSIS — D122 Benign neoplasm of ascending colon: Secondary | ICD-10-CM | POA: Diagnosis not present

## 2015-09-06 DIAGNOSIS — D123 Benign neoplasm of transverse colon: Secondary | ICD-10-CM | POA: Diagnosis not present

## 2015-09-06 DIAGNOSIS — Z09 Encounter for follow-up examination after completed treatment for conditions other than malignant neoplasm: Secondary | ICD-10-CM | POA: Diagnosis not present

## 2015-09-06 LAB — HM COLONOSCOPY

## 2015-09-16 ENCOUNTER — Other Ambulatory Visit: Payer: Self-pay | Admitting: Internal Medicine

## 2015-10-11 ENCOUNTER — Encounter: Payer: Self-pay | Admitting: Internal Medicine

## 2015-10-13 ENCOUNTER — Ambulatory Visit (INDEPENDENT_AMBULATORY_CARE_PROVIDER_SITE_OTHER): Payer: Medicare Other | Admitting: Neurology

## 2015-10-13 ENCOUNTER — Encounter: Payer: Self-pay | Admitting: Neurology

## 2015-10-13 VITALS — BP 138/60 | HR 81 | Ht 72.0 in | Wt 303.0 lb

## 2015-10-13 DIAGNOSIS — G7000 Myasthenia gravis without (acute) exacerbation: Secondary | ICD-10-CM | POA: Diagnosis not present

## 2015-10-13 DIAGNOSIS — G453 Amaurosis fugax: Secondary | ICD-10-CM | POA: Diagnosis not present

## 2015-10-13 NOTE — Progress Notes (Signed)
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  He is accompanied by his wife, who provides some history.  UPDATE: He has been off of prednisone for about 2 months.  He is doing well.  He denies diplopia, shortness of breath, dysphagia or neck weakness.  HISTORY: He developed left eyelid ptosis after knee surgery last November.  He denied any blurred vision or diplopia.  Ach binding antibodies were 19.4 and modulating antibodies were 86.  CT of chest revealed no abnormalities of thymus.  TSH 0.38.  He was started on Mestinon and developed loose stool on dose of 60mg  BID.  He has history of bradycardia and first degree heart black.  Palpitations worsened when started on Mestinon.  Dose was reduced to 30mg  BID and then once daily, which did improve his symptoms.  His cardiologist, Dr. Acie Fredrickson, discontinued the carvedilol to see if this contributed to the bradycardia.  He still was experiencing diarrhea on low-dose Mestinon, so it was discontinued and he was started on prednisone.  In September 2015, he suddenly he had some vision loss in the left half of his visual field.  When he closed either eye, he noted that the right eye was fine.  There was some associated dizziness.  It lasted for 2 minutes and resolved.  About a month before that, he began seeing brief flashes in the left part of his vision whenever he blinks.  He saw his ophthalmologist at that time who told him he had "floaters".  MRI of the brain without contrast was performed on 07/30/14, which revealed mild chronic small vessel ischemic changes but no acute intracranial abnormalities.  Carotid doppler revealed no significant plaque or stenosis.  He is on ASA.  PAST MEDICAL HISTORY: Past Medical History  Diagnosis  Date  . Dysrhythmia   . High blood pressure   . MG, ocular (myasthenia gravis) ([place])     ? of   . Hyperopia 2016  . Presbyopia OU 2016    MEDICATIONS: Current Outpatient Prescriptions on File Prior to Visit  Medication Sig Dispense Refill  . aspirin 81 MG tablet Take 81 mg by mouth daily.    . fish oil-omega-3 fatty acids 1000 MG capsule Take 1 g by mouth daily.    . hydrochlorothiazide (HYDRODIURIL) 25 MG tablet Take 1 tablet (25 mg total) by mouth daily. 90 tablet 2  . losartan (COZAAR) 100 MG tablet Take 1 tablet (100 mg total) by mouth daily. 90 tablet 2  . omeprazole (PRILOSEC) 20 MG capsule Take 1 capsule (20 mg total) by mouth daily. 90 capsule 2  . potassium chloride (K-DUR) 10 MEQ tablet Take 1 tablet (10 mEq total) by mouth daily. 90 tablet 2  . predniSONE (DELTASONE) 1 MG tablet Take 1 tablet three times a week.  Call in 4 weeks for adjusted refill. 12 tablet 0  . simvastatin (ZOCOR) 80 MG tablet Take 1 tablet (80 mg total) by mouth daily. 90 tablet 2   No current facility-administered medications on file prior to visit.    ALLERGIES: No Known Allergies  FAMILY HISTORY: Family History  Problem Relation Age of Onset  . Diabetes Brother   . Colon cancer Neg Hx   . Prostate cancer Neg Hx   . Stroke Neg Hx   .  CAD Neg Hx     SOCIAL HISTORY: Social History   Social History  . Marital Status: Married    Spouse Name: N/A  . Number of Children: 4  . Years of Education: N/A   Occupational History  . [employer]-- retired 2010    Social History Main Topics  . Smoking status: Former Research scientist (life sciences)  . Smokeless tobacco: Never Used     Comment: Quit 46 yrs ago  . Alcohol Use: No     Comment: none  . Drug Use: No  . Sexual Activity:    Partners: Female   Other Topics Concern  . Not on file   Social History Narrative   married, lives w/ second wife, 2 living children, lost 2 kids      REVIEW OF SYSTEMS: Constitutional: No fevers, chills, or sweats, no  generalized fatigue, change in appetite Eyes: No visual changes, double vision, eye pain Ear, nose and throat: No hearing loss, ear pain, nasal congestion, sore throat Cardiovascular: No chest pain, palpitations Respiratory:  No shortness of breath at rest or with exertion, wheezes GastrointestinaI: No nausea, vomiting, diarrhea, abdominal pain, fecal incontinence Genitourinary:  No dysuria, urinary retention or frequency Musculoskeletal:  No neck pain, back pain Integumentary: No rash, pruritus, skin lesions Neurological: as above Psychiatric: No depression, insomnia, anxiety Endocrine: No palpitations, fatigue, diaphoresis, mood swings, change in appetite, change in weight, increased thirst Hematologic/Lymphatic:  No anemia, purpura, petechiae. Allergic/Immunologic: no itchy/runny eyes, nasal congestion, recent allergic reactions, rashes  PHYSICAL EXAM: Filed Vitals:   10/13/15 0929  BP: 138/60  Pulse: 81   General: No acute distress.  Patient appears well-groomed.  Head:  Normocephalic/atraumatic Eyes:  Fundoscopic exam unremarkable without vessel changes, exudates, hemorrhages or papilledema. Neck: supple, no paraspinal tenderness, full range of motion Heart:  Regular rate and rhythm Lungs:  Clear to auscultation bilaterally Back: No paraspinal tenderness Neurological Exam: alert and oriented to person, place, and time. Attention span and concentration intact, recent and remote memory intact, fund of knowledge intact.  Speech fluent and not dysarthric, language intact.  Left ptosis.  Otherwise CN II-XII intact. Fundoscopic exam unremarkable without vessel changes, exudates, hemorrhages or papilledema.  Bulk and tone normal, muscle strength 5/5 throughout, including neck flexors/extensors.  Sensation to light touch  intact.  Deep tendon reflexes 2+ throughout.  Finger to nose testing intact.  Gait normal.  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  15 minutes spent face to face with patient, over 50% spent discussing management.  Metta Clines, DO  CC:  [name], MD

## 2015-10-13 NOTE — Patient Instructions (Addendum)
1.  Continue aspirin 81mg  daily and Zocor 2.  Call with any questions or concerns 3.  Weight loss 4.  Follow up in 7 months

## 2015-11-24 ENCOUNTER — Encounter: Payer: Self-pay | Admitting: Cardiovascular Disease

## 2015-11-24 ENCOUNTER — Ambulatory Visit (INDEPENDENT_AMBULATORY_CARE_PROVIDER_SITE_OTHER): Payer: Medicare Other | Admitting: Cardiovascular Disease

## 2015-11-24 ENCOUNTER — Ambulatory Visit: Payer: Medicare Other | Admitting: Cardiovascular Disease

## 2015-11-24 VITALS — BP 144/84 | HR 68 | Ht 72.0 in | Wt 303.8 lb

## 2015-11-24 DIAGNOSIS — I493 Ventricular premature depolarization: Secondary | ICD-10-CM | POA: Insufficient documentation

## 2015-11-24 DIAGNOSIS — I10 Essential (primary) hypertension: Secondary | ICD-10-CM | POA: Diagnosis not present

## 2015-11-24 NOTE — Progress Notes (Signed)
Cardiology Office Note   Date:  11/24/2015   ID:  [name] [name], DOB 13-Feb-1941, MRN [id number]  PCP:  [name], MD  Cardiologist:   Acie Fredrickson [name], MD   Chief Complaint  Patient presents with  . Follow-up    palpitations       History of Present Illness: [name] [name] is a 75 y.o. male who presents for follow up.  Problem List  Problem List: 1. Palpitations 2. myasthemia gravis ( newly diagnosed)  3. Bradycardia 4. Hypertension 5. Hyperlipidemia   Has some dyspnea.  Has some vague left sided chest pain.  Lasts for a second.  Typically after eating  - likely is gas pain. Gas like pain  Has DOE. Walks when he can.  Not much walking in the snow.   His wife had surgery at the first part of December. Since that time he's been doing all of the cooking and as a result has gained some weight. We discussed alternatives to his diet.  Feb. 1, 2017; Has some skipping in his heart.  Has lack of energy when when has palpitations   No CP , some dyspnea .  Exercises 4 times a week - walks 1 1/4 miles a day   Wt Readings from Last 3 Encounters:  11/24/15 303 lb 12.8 oz (137.803 kg)  10/13/15 303 lb (137.44 kg)  06/25/15 296 lb 3.2 oz (134.355 kg)     Past Medical History  Diagnosis Date  . Dysrhythmia   . High blood pressure   . MG, ocular (myasthenia gravis) ([place])     ? of   . Hyperopia 2016  . Presbyopia OU 2016    Past Surgical History  Procedure Laterality Date  . Hernia repair      bilateral inguinal hernia  . Hernia repair      umbilical  . Joint replacement  2009    right knee  . 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 Middle ear surgery Right     ear -patched hole in ear drum     Current Outpatient Prescriptions  Medication Sig Dispense Refill  . aspirin 81 MG tablet Take 81 mg by mouth daily.    . fish oil-omega-3  fatty acids 1000 MG capsule Take 1 g by mouth daily.    . hydrochlorothiazide (HYDRODIURIL) 25 MG tablet Take 1 tablet (25 mg total) by mouth daily. 90 tablet 2  . losartan (COZAAR) 100 MG tablet Take 1 tablet (100 mg total) by mouth daily. 90 tablet 2  . omeprazole (PRILOSEC) 20 MG capsule Take 1 capsule (20 mg total) by mouth daily. 90 capsule 2  . potassium chloride (K-DUR) 10 MEQ tablet Take 1 tablet (10 mEq total) by mouth daily. 90 tablet 2  . predniSONE (DELTASONE) 1 MG tablet Take 1 tablet three times a week.  Call in 4 weeks for adjusted refill. 12 tablet 0  . simvastatin (ZOCOR) 80 MG tablet Take 1 tablet (80 mg total) by mouth daily. (Patient taking differently: Take 40 mg by mouth daily. ) 90 tablet 2   No current facility-administered medications for this visit.    Allergies:   Review of patient's allergies indicates no known allergies.    Social History:  The patient  reports that he has quit smoking. He has never used smokeless tobacco. He reports that he does  not drink alcohol or use illicit drugs.   Family History:  The patient's family history includes Diabetes in his brother. There is no history of Colon cancer, Prostate cancer, Stroke, or CAD.    ROS:  Please see the history of present illness.    Review of Systems: Constitutional:  denies fever, chills, diaphoresis, appetite change and fatigue. + weight gain .   HEENT: denies photophobia, eye pain, redness, hearing loss, ear pain, congestion, sore throat, rhinorrhea, sneezing, neck pain, neck stiffness and tinnitus.  Respiratory: denies SOB, DOE, cough, chest tightness, and wheezing.  Cardiovascular: denies chest pain, palpitations and leg swelling.  Gastrointestinal: denies nausea, vomiting, abdominal pain, diarrhea, constipation, blood in stool.  Genitourinary: denies dysuria, urgency, frequency, hematuria, flank pain and difficulty urinating.  Musculoskeletal: denies  myalgias, back pain, joint swelling,  arthralgias and gait problem.   Skin: denies pallor, rash and wound.  Neurological: denies dizziness, seizures, syncope, weakness, light-headedness, numbness and headaches.   Hematological: denies adenopathy, easy bruising, personal or family bleeding history.  Psychiatric/ Behavioral: denies suicidal ideation, mood changes, confusion, nervousness, sleep disturbance and agitation.       All other systems are reviewed and negative.    PHYSICAL EXAM: VS:  BP 144/84 mmHg  Pulse 68  Ht 6' (1.829 m)  Wt 303 lb 12.8 oz (137.803 kg)  BMI 41.19 kg/m2 , BMI Body mass index is 41.19 kg/(m^2). GEN: Well nourished, well developed, in no acute distress HEENT: normal Neck: no JVD, carotid bruits, or masses Cardiac: RRR; no murmurs, rubs, or gallops,no edema  Respiratory:  clear to auscultation bilaterally, normal work of breathing GI: soft, nontender, nondistended, + BS MS: no deformity or atrophy Skin: warm and dry, no rash Neuro:  Strength and sensation are intact Psych: normal   EKG:  EKG is ordered today. The ekg ordered today demonstrates NSR at 94,  No ST or T wave changes.     Recent Labs: 02/08/2015: ALT 20 06/23/2015: BUN 16; Creatinine, Ser 1.08; Potassium 3.8; Sodium 140    Lipid Panel    Component Value Date/Time   CHOL 152 06/23/2015 0950   TRIG 208.0* 06/23/2015 0950   TRIG 247* 08/30/2006 1117   HDL 37.00* 06/23/2015 0950   CHOLHDL 4 06/23/2015 0950   CHOLHDL 7.1 CALC 08/30/2006 1117   VLDL 41.6* 06/23/2015 0950   LDLCALC 60 02/02/2014 1123   LDLDIRECT 81.0 06/23/2015 0950   LDLDIRECT 163.5 08/30/2006 1117      Wt Readings from Last 3 Encounters:  11/24/15 303 lb 12.8 oz (137.803 kg)  10/13/15 303 lb (137.44 kg)  06/25/15 296 lb 3.2 oz (134.355 kg)      Other studies Reviewed: Additional studies/ records that were reviewed today include: . Review of the above records demonstrates:    ASSESSMENT AND PLAN:  Problem List: 1. Palpitations - likely are  PVCs, I suspect these are benign. He has worn a  30 day monitor but it did not find anything significant.  2. myasthemia gravis  3. Bradycardia 4. Hypertension 5. Hyperlipidemia - on simvastatin . 6. Obesity - he has gained 7 lbs since I last saw him.  Encouraged him to work on this   Current medicines are reviewed at length with the patient today.  The patient does not have concerns regarding medicines.  The following changes have been made:  no change  Labs/ tests ordered today include:  No orders of the defined types were placed in this encounter.     Disposition:  FU with me in 1 year     Signed, Yerlin Gasparyan, [name], MD  11/24/2015 3:01 PM    [address], [place], [place]  [address] Phone: [phone]; Fax: [phone]

## 2015-11-24 NOTE — Patient Instructions (Signed)
Medication Instructions:  Your physician recommends that you continue on your current medications as directed. Please refer to the Current Medication list given to you today.   Labwork: None Ordered   Testing/Procedures: None Ordered   Follow-Up: Your physician wants you to follow-up in: 1 year with Dr. [name].  You will receive a reminder letter in the mail two months in advance. If you don't receive a letter, please call our office to schedule the follow-up appointment.   If you need a refill on your cardiac medications before your next appointment, please call your pharmacy.   Thank you for choosing [facility]! Irma Roulhac, RN [phone]    

## 2015-12-12 ENCOUNTER — Other Ambulatory Visit: Payer: Self-pay | Admitting: Internal Medicine

## 2015-12-21 ENCOUNTER — Encounter: Payer: Self-pay | Admitting: Internal Medicine

## 2015-12-21 ENCOUNTER — Ambulatory Visit (INDEPENDENT_AMBULATORY_CARE_PROVIDER_SITE_OTHER): Payer: Medicare Other | Admitting: Internal Medicine

## 2015-12-21 VITALS — BP 142/60 | HR 53 | Temp 97.8°F | Ht 72.0 in | Wt 298.4 lb

## 2015-12-21 DIAGNOSIS — R739 Hyperglycemia, unspecified: Secondary | ICD-10-CM | POA: Diagnosis not present

## 2015-12-21 DIAGNOSIS — E669 Obesity, unspecified: Secondary | ICD-10-CM | POA: Diagnosis not present

## 2015-12-21 DIAGNOSIS — E785 Hyperlipidemia, unspecified: Secondary | ICD-10-CM

## 2015-12-21 DIAGNOSIS — I10 Essential (primary) hypertension: Secondary | ICD-10-CM | POA: Diagnosis not present

## 2015-12-21 DIAGNOSIS — Z09 Encounter for follow-up examination after completed treatment for conditions other than malignant neoplasm: Secondary | ICD-10-CM

## 2015-12-21 LAB — AST: AST: 17 U/L (ref 0–37)

## 2015-12-21 LAB — CBC WITH DIFFERENTIAL/PLATELET
BASOS PCT: 0.6 % (ref 0.0–3.0)
Basophils Absolute: 0 10*3/uL (ref 0.0–0.1)
EOS PCT: 5.4 % — AB (ref 0.0–5.0)
Eosinophils Absolute: 0.4 10*3/uL (ref 0.0–0.7)
HEMATOCRIT: 40.5 % (ref 39.0–52.0)
Hemoglobin: 13.9 g/dL (ref 13.0–17.0)
LYMPHS ABS: 1.6 10*3/uL (ref 0.7–4.0)
LYMPHS PCT: 22.6 % (ref 12.0–46.0)
MCHC: 34.2 g/dL (ref 30.0–36.0)
MCV: 88.3 fl (ref 78.0–100.0)
MONOS PCT: 10.2 % (ref 3.0–12.0)
Monocytes Absolute: 0.7 10*3/uL (ref 0.1–1.0)
NEUTROS ABS: 4.3 10*3/uL (ref 1.4–7.7)
NEUTROS PCT: 61.2 % (ref 43.0–77.0)
PLATELETS: 302 10*3/uL (ref 150.0–400.0)
RBC: 4.59 Mil/uL (ref 4.22–5.81)
RDW: 13.1 % (ref 11.5–15.5)
WBC: 7 10*3/uL (ref 4.0–10.5)

## 2015-12-21 LAB — LDL CHOLESTEROL, DIRECT: Direct LDL: 85 mg/dL

## 2015-12-21 LAB — BASIC METABOLIC PANEL
BUN: 11 mg/dL (ref 6–23)
CALCIUM: 9.3 mg/dL (ref 8.4–10.5)
CO2: 31 mEq/L (ref 19–32)
Chloride: 100 mEq/L (ref 96–112)
Creatinine, Ser: 1.04 mg/dL (ref 0.40–1.50)
GFR: 74.31 mL/min (ref >60.00)
GLUCOSE: 113 mg/dL — AB (ref 70–99)
POTASSIUM: 4.3 meq/L (ref 3.5–5.1)
SODIUM: 137 meq/L (ref 135–145)

## 2015-12-21 LAB — LIPID PANEL
CHOL/HDL RATIO: 5
CHOLESTEROL: 159 mg/dL (ref 0–200)
HDL: 35 mg/dL — ABNORMAL LOW (ref >39.00)
NonHDL: 124.02
TRIGLYCERIDES: 301 mg/dL — AB (ref 0.0–149.0)
VLDL: 60.2 mg/dL — ABNORMAL HIGH (ref 0.0–40.0)

## 2015-12-21 LAB — ALT: ALT: 16 U/L (ref 0–53)

## 2015-12-21 LAB — HEMOGLOBIN A1C: Hgb A1c MFr Bld: 6.3 % (ref 4.6–6.5)

## 2015-12-21 NOTE — Progress Notes (Signed)
Subjective:    Patient ID: [name] [name], male    DOB: 05/23/1941, 75 y.o.   MRN: [id number]  DOS:  12/21/2015 Type of visit - description : Routine visit Interval history: Morbid obesity: Since the last time he saw cardiology is trying to do better, has decrease portions trying to eat less carbohydrates, per his own scales lost 7 pounds. High cholesterol: Due for labs. DM: Good compliance with medications, BP today is 142/60, similar readings in the outpatient setting.  BP Readings from Last 3 Encounters:  12/21/15 142/60  11/24/15 144/84  10/13/15 138/60     Review of Systems  Denies chest pain or difficulty breathing. No nausea, vomiting, diarrhea. Admits to snoring and broken sleep, energy is relatively okay.   Past Medical History  Diagnosis Date  . Dysrhythmia   . High blood pressure   . MG, ocular (myasthenia gravis) ([place])     ? of   . Hyperopia 2016  . Presbyopia OU 2016    Past Surgical History  Procedure Laterality Date  . Hernia repair      bilateral inguinal hernia  . Hernia repair      umbilical  . Joint replacement  2009    right knee  . 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 Middle ear surgery Right     ear -patched hole in ear drum    Social History   Social History  . Marital Status: Married    Spouse Name: N/A  . Number of Children: 4  . Years of Education: N/A   Occupational History  . [employer]-- retired 2010    Social History Main Topics  . Smoking status: Former Research scientist (life sciences)  . Smokeless tobacco: Never Used     Comment: Quit 46 yrs ago  . Alcohol Use: No     Comment: none  . Drug Use: No  . Sexual Activity:    Partners: Female   Other Topics Concern  . Not on file   Social History Narrative   married, lives w/ second wife, 2 living children, lost 2 kids          Medication List         This list is accurate as of: 12/21/15 11:59 PM.  Always use your most recent med list.               aspirin 81 MG tablet  Take 81 mg by mouth daily.     fish oil-omega-3 fatty acids 1000 MG capsule  Take 1 g by mouth daily.     hydrochlorothiazide 25 MG tablet  Commonly known as:  HYDRODIURIL  Take 1 tablet (25 mg total) by mouth daily.     losartan 100 MG tablet  Commonly known as:  COZAAR  Take 1 tablet (100 mg total) by mouth daily.     omeprazole 20 MG capsule  Commonly known as:  PRILOSEC  Take 1 capsule (20 mg total) by mouth daily.     potassium chloride 10 MEQ tablet  Commonly known as:  KLOR-CON M10  Take 1 tablet (10 mEq total) by mouth daily.     simvastatin 80 MG tablet  Commonly known as:  ZOCOR  Take 40 mg by mouth daily.           Objective:   Physical Exam BP 142/60 mmHg  Pulse  53  Temp(Src) 97.8 F (36.6 C) (Oral)  Ht 6' (1.829 m)  Wt 298 lb 6 oz (135.342 kg)  BMI 40.46 kg/m2  SpO2 97% General:   Well developed, well nourished . NAD.  HEENT:  Normocephalic . Face symmetric, atraumatic Lungs:  CTA B Normal respiratory effort, no intercostal retractions, no accessory muscle use. Heart: RRR,  no murmur.  No pretibial edema bilaterally  Skin: Not pale. Not jaundice Neurologic:  alert & oriented X3.  Speech normal, gait appropriate for age and unassisted Psych--  Cognition and judgment appear intact.  Cooperative with normal attention span and concentration.  Behavior appropriate. No anxious or depressed appearing.      Assessment & Plan:   Assessment Prediabetes HTN Hyperlipidemia Palpitations, PVCs, h/o bradycardia Myasthenia gravis Morbid obesity  Plan: Prediabetes: Check A1c HTN, continue losartan, HCTZ and potassium. Check a BMP. BPs in the 140s, needs better control, is working on his diet, no change in medications for now Hyperlipidemia: On simvastatin 40 mg, labs. Morbid obesity:    he is active but admits that his  diet needs help. He is already trying to do better >> lost 7 pounds. Does  not use a computer thus self education is difficult, will refer to a nutritionist. Snoring: pt is obese, snores, epworth scale score #9 (barely +). For now recommend work on his weight, reassess his snoring at least yearly. RTC 4-5  months

## 2015-12-21 NOTE — Progress Notes (Signed)
Pre visit review using our clinic review tool, if applicable. No additional management support is needed unless otherwise documented below in the visit note. 

## 2015-12-21 NOTE — Patient Instructions (Signed)
GO TO THE LAB : Get the blood work    GO TO THE FRONT DESK  Schedule a routine office visit or check up to be done in  4-5 months  No  fasting    Check the  blood pressure 2 or 3 times a week Be sure your blood pressure is between 110/65 and  145/85. If it is consistently higher or lower, let me know

## 2015-12-22 NOTE — Assessment & Plan Note (Signed)
Prediabetes: Check A1c HTN, continue losartan, HCTZ and potassium. Check a BMP. BPs in the 140s, needs better control, is working on his diet, no change in medications for now Hyperlipidemia: On simvastatin 40 mg, labs. Morbid obesity:    he is active but admits that his diet needs help. He is already trying to do better >> lost 7 pounds. Does  not use a computer thus self education is difficult, will refer to a nutritionist. Snoring: pt is obese, snores, epworth scale score #9 (barely +). For now recommend work on his weight, reassess his snoring at least yearly. RTC 4-5  months

## 2016-01-01 DIAGNOSIS — M5136 Other intervertebral disc degeneration, lumbar region: Secondary | ICD-10-CM | POA: Diagnosis not present

## 2016-01-01 DIAGNOSIS — M6289 Other specified disorders of muscle: Secondary | ICD-10-CM | POA: Diagnosis not present

## 2016-01-01 DIAGNOSIS — M545 Low back pain: Secondary | ICD-10-CM | POA: Diagnosis not present

## 2016-01-18 DIAGNOSIS — G8929 Other chronic pain: Secondary | ICD-10-CM | POA: Diagnosis not present

## 2016-01-18 DIAGNOSIS — M545 Low back pain: Secondary | ICD-10-CM | POA: Diagnosis not present

## 2016-01-24 DIAGNOSIS — G8929 Other chronic pain: Secondary | ICD-10-CM | POA: Diagnosis not present

## 2016-01-24 DIAGNOSIS — M545 Low back pain: Secondary | ICD-10-CM | POA: Diagnosis not present

## 2016-01-26 DIAGNOSIS — L570 Actinic keratosis: Secondary | ICD-10-CM | POA: Diagnosis not present

## 2016-01-26 DIAGNOSIS — L578 Other skin changes due to chronic exposure to nonionizing radiation: Secondary | ICD-10-CM | POA: Diagnosis not present

## 2016-01-26 DIAGNOSIS — L821 Other seborrheic keratosis: Secondary | ICD-10-CM | POA: Diagnosis not present

## 2016-02-01 DIAGNOSIS — G8929 Other chronic pain: Secondary | ICD-10-CM | POA: Diagnosis not present

## 2016-02-01 DIAGNOSIS — M545 Low back pain: Secondary | ICD-10-CM | POA: Diagnosis not present

## 2016-02-14 DIAGNOSIS — M5136 Other intervertebral disc degeneration, lumbar region: Secondary | ICD-10-CM | POA: Diagnosis not present

## 2016-02-14 DIAGNOSIS — M545 Low back pain: Secondary | ICD-10-CM | POA: Diagnosis not present

## 2016-02-14 DIAGNOSIS — M6289 Other specified disorders of muscle: Secondary | ICD-10-CM | POA: Diagnosis not present

## 2016-02-15 DIAGNOSIS — M545 Low back pain: Secondary | ICD-10-CM | POA: Diagnosis not present

## 2016-02-15 DIAGNOSIS — G8929 Other chronic pain: Secondary | ICD-10-CM | POA: Diagnosis not present

## 2016-02-23 DIAGNOSIS — G8929 Other chronic pain: Secondary | ICD-10-CM | POA: Diagnosis not present

## 2016-02-23 DIAGNOSIS — M545 Low back pain: Secondary | ICD-10-CM | POA: Diagnosis not present

## 2016-03-14 DIAGNOSIS — M19011 Primary osteoarthritis, right shoulder: Secondary | ICD-10-CM | POA: Diagnosis not present

## 2016-03-14 DIAGNOSIS — M7541 Impingement syndrome of right shoulder: Secondary | ICD-10-CM | POA: Diagnosis not present

## 2016-03-14 DIAGNOSIS — S46011A Strain of muscle(s) and tendon(s) of the rotator cuff of right shoulder, initial encounter: Secondary | ICD-10-CM | POA: Diagnosis not present

## 2016-03-15 ENCOUNTER — Other Ambulatory Visit: Payer: Self-pay | Admitting: Internal Medicine

## 2016-03-27 DIAGNOSIS — M5136 Other intervertebral disc degeneration, lumbar region: Secondary | ICD-10-CM | POA: Diagnosis not present

## 2016-03-27 DIAGNOSIS — M6289 Other specified disorders of muscle: Secondary | ICD-10-CM | POA: Diagnosis not present

## 2016-03-27 DIAGNOSIS — M545 Low back pain: Secondary | ICD-10-CM | POA: Diagnosis not present

## 2016-03-27 DIAGNOSIS — G8929 Other chronic pain: Secondary | ICD-10-CM | POA: Diagnosis not present

## 2016-04-12 DIAGNOSIS — M7541 Impingement syndrome of right shoulder: Secondary | ICD-10-CM | POA: Diagnosis not present

## 2016-04-12 DIAGNOSIS — M19011 Primary osteoarthritis, right shoulder: Secondary | ICD-10-CM | POA: Diagnosis not present

## 2016-04-12 DIAGNOSIS — M25511 Pain in right shoulder: Secondary | ICD-10-CM | POA: Diagnosis not present

## 2016-05-01 ENCOUNTER — Ambulatory Visit: Payer: Medicare Other | Admitting: Internal Medicine

## 2016-05-10 DIAGNOSIS — M19011 Primary osteoarthritis, right shoulder: Secondary | ICD-10-CM | POA: Diagnosis not present

## 2016-05-18 ENCOUNTER — Ambulatory Visit: Payer: Medicare Other | Admitting: Neurology

## 2016-05-26 ENCOUNTER — Encounter: Payer: Self-pay | Admitting: Internal Medicine

## 2016-05-26 ENCOUNTER — Ambulatory Visit (INDEPENDENT_AMBULATORY_CARE_PROVIDER_SITE_OTHER): Payer: Medicare Other | Admitting: Internal Medicine

## 2016-05-26 VITALS — BP 132/76 | HR 56 | Temp 97.8°F | Resp 14 | Ht 72.0 in | Wt 301.1 lb

## 2016-05-26 DIAGNOSIS — I10 Essential (primary) hypertension: Secondary | ICD-10-CM | POA: Diagnosis not present

## 2016-05-26 DIAGNOSIS — R739 Hyperglycemia, unspecified: Secondary | ICD-10-CM

## 2016-05-26 LAB — BASIC METABOLIC PANEL
BUN: 13 mg/dL (ref 6–23)
CO2: 31 mEq/L (ref 19–32)
CREATININE: 1.08 mg/dL (ref 0.40–1.50)
Calcium: 9.3 mg/dL (ref 8.4–10.5)
Chloride: 100 mEq/L (ref 96–112)
GFR: 71.06 mL/min (ref >60.00)
Glucose, Bld: 108 mg/dL — ABNORMAL HIGH (ref 70–99)
Potassium: 4.3 mEq/L (ref 3.5–5.1)
Sodium: 138 mEq/L (ref 135–145)

## 2016-05-26 LAB — HEMOGLOBIN A1C: HEMOGLOBIN A1C: 6.3 % (ref 4.6–6.5)

## 2016-05-26 NOTE — Progress Notes (Signed)
Subjective:    Patient ID: [name] [name], male    DOB: Dec 23, 1940, 75 y.o.   MRN: [id number]  DOS:  05/26/2016 Type of visit - description : rov Interval history: Had a shoulder injury 01-2016, to have surgery HTN: Good compliance of medication, ambulatory BPs in the 120/80.  Wt Readings from Last 3 Encounters:  05/26/16 (!) 301 lb 2 oz (136.6 kg)  12/21/15 298 lb 6 oz (135.3 kg)  11/24/15 (!) 303 lb 12.8 oz (137.8 kg)    Review of Systems  No chest pain or difficulty breathing Denies fatigue, feeling sleepy but admits to snoring  Past Medical History:  Diagnosis Date  . Dysrhythmia   . Hyperopia 2016  . Hypertension   . MG, ocular (myasthenia gravis) ([place])    ? of   . Presbyopia OU 2016    Past Surgical History:  Procedure Laterality Date  . HERNIA REPAIR     bilateral inguinal hernia  . HERNIA REPAIR     umbilical  . JOINT REPLACEMENT  2009   right knee  . MIDDLE EAR SURGERY Right    ear -patched hole in ear drum  . TOTAL KNEE ARTHROPLASTY  09/10/2012   Procedure: TOTAL KNEE ARTHROPLASTY;  Surgeon: Tobi Bastos, MD;  Location: [facility];  Service: Orthopedics;  Laterality: Left;    Social History   Social History  . Marital status: Married    Spouse name: N/A  . Number of children: 4  . Years of education: N/A   Occupational History  . [employer]-- retired 2010    Social History Main Topics  . Smoking status: Former Research scientist (life sciences)  . Smokeless tobacco: Never Used     Comment: Quit 46 yrs ago  . Alcohol use No     Comment: none  . Drug use: No  . Sexual activity: Yes    Partners: Female   Other Topics Concern  . Not on file   Social History Narrative   married, lives w/ second wife, 2 living children, lost 2 kids          Medication List       Accurate as of 05/26/16 11:59 PM. Always use your most recent med list.          acetaminophen 650 MG CR tablet Commonly known as:  TYLENOL Take 650 mg by mouth every 8 (eight) hours as needed for  pain.   aspirin 81 MG tablet Take 81 mg by mouth daily.   fish oil-omega-3 fatty acids 1000 MG capsule Take 1 g by mouth daily.   hydrochlorothiazide 25 MG tablet Commonly known as:  HYDRODIURIL Take 1 tablet (25 mg total) by mouth daily.   losartan 100 MG tablet Commonly known as:  COZAAR Take 1 tablet (100 mg total) by mouth daily.   meloxicam 15 MG tablet Commonly known as:  MOBIC Take 15 mg by mouth daily.   omeprazole 20 MG capsule Commonly known as:  PRILOSEC Take 1 capsule (20 mg total) by mouth daily.   potassium chloride 10 MEQ tablet Commonly known as:  KLOR-CON M10 Take 1 tablet (10 mEq total) by mouth daily.   simvastatin 80 MG tablet Commonly known as:  ZOCOR Take 80 mg by mouth daily.          Objective:   Physical Exam BP 132/76 (BP Location: Left Arm, Patient Position: Sitting, Cuff Size: Normal)   Pulse (!) 56   Temp 97.8 F (36.6 C) (Oral)   Resp  14   Ht 6' (1.829 m)   Wt (!) 301 lb 2 oz (136.6 kg)   SpO2 96%   BMI 40.84 kg/m  General:   Well developed, morbidly obese . NAD.  HEENT:  Normocephalic . Face symmetric, atraumatic Lungs:  CTA B Normal respiratory effort, no intercostal retractions, no accessory muscle use. Heart: RRR,  no murmur.  Trace pretibial edema bilaterally  Skin: Not pale. Not jaundice Neurologic:  alert & oriented X3.  Speech normal, gait appropriate for age and unassisted Psych--  Cognition and judgment appear intact.  Cooperative with normal attention span and concentration.  Behavior appropriate. No anxious or depressed appearing.      Assessment & Plan:    Assessment Prediabetes HTN Hyperlipidemia Palpitations, PVCs, h/o bradycardia Myasthenia gravis Morbid obesity  PLAN: Prediabetes: Was doing really well with diet and taking he had a shoulder injury, recommend to go back to a healthy diet, check A1c HTN: Check a BMP, continue same meds. Morbid obesity: Plans to go back to a healthier diet  once shoulder pain is better Snoring: Denies fatigue or feeling sleepy. R Shoulder injury: To have surgery soon, advised to be sure his doctors know that he has a history of myasthenia. RTC 4 months, CPX

## 2016-05-26 NOTE — Patient Instructions (Addendum)
GO TO THE LAB : Get the blood work     GO TO THE FRONT DESK Schedule your next appointment for a  Physical exam in 4 months, fasting    Please be sure the doctors at the hospital know you have myasthenia

## 2016-05-26 NOTE — Progress Notes (Signed)
Pre visit review using our clinic review tool, if applicable. No additional management support is needed unless otherwise documented below in the visit note. 

## 2016-05-28 NOTE — Assessment & Plan Note (Signed)
Prediabetes: Was doing really well with diet and taking he had a shoulder injury, recommend to go back to a healthy diet, check A1c HTN: Check a BMP, continue same meds. Morbid obesity: Plans to go back to a healthier diet once shoulder pain is better Snoring: Denies fatigue or feeling sleepy. R Shoulder injury: To have surgery soon, advised to be sure his doctors know that he has a history of myasthenia. RTC 4 months, CPX

## 2016-06-07 ENCOUNTER — Other Ambulatory Visit: Payer: Self-pay | Admitting: Internal Medicine

## 2016-06-08 ENCOUNTER — Other Ambulatory Visit (HOSPITAL_COMMUNITY): Payer: Self-pay | Admitting: *Deleted

## 2016-06-08 NOTE — Pre-Procedure Instructions (Addendum)
[name] [name]  06/08/2016      [id number] [address], [place] [address] Phone: [phone] [phone]    Your procedure is scheduled on Thursday, June 22, 2016     Report to [facility] Admitting Office at 8:00 AM.   Call this number if you have problems the morning of surgery: [phone]   Questions prior to day of surgery, please call [phone] between 8 & 4 PM.   Remember:  Do not eat food or drink liquids after midnight Wednesday, 06/21/16.  Take these medicines the morning of surgery with A SIP OF WATER: Tylenol, Omeprazole (Prilosec) - if needed  STOP all herbel meds, nsaids (aleve,naproxen,advil,ibuprofen) 5 days prior to surgery including all vitamins, fish oil, aspirin starting 06/17/16   Do not wear jewelry.  Do not wear lotions, powders, or cologne.  You may wear deodorant.  Men may shave face and neck.  Do not bring valuables to the hospital.  [facility] is not responsible for any belongings or valuables.  Contacts, dentures or bridgework may not be worn into surgery.  Leave your suitcase in the car.  After surgery it may be brought to your room.  For patients admitted to the hospital, discharge time will be determined by your treatment team.  Special instructions:  [place] - Preparing for Surgery  Before surgery, you can play an important role.  Because skin is not sterile, your skin needs to be as free of germs as possible.  You can reduce the number of germs on you skin by washing with CHG (chlorahexidine gluconate) soap before surgery.  CHG is an antiseptic cleaner which kills germs and bonds with the skin to continue killing germs even after washing.  Please DO NOT use if you have an allergy to CHG or antibacterial soaps.  If your skin becomes reddened/irritated stop using the CHG and inform your nurse when you arrive at Short Stay.  Do not shave (including legs  and underarms) for at least 48 hours prior to the first CHG shower.  You may shave your face.  Please follow these instructions carefully:   1.  Shower with CHG Soap the night before surgery and the                    morning of Surgery.  2.  If you choose to wash your hair, wash your hair first as usual with your       normal shampoo.  3.  After you shampoo, rinse your hair and body thoroughly to remove the shampoo.  4.  Use CHG as you would any other liquid soap.  You can apply chg directly       to the skin and wash gently with scrungie or a clean washcloth.  5.  Apply the CHG Soap to your body ONLY FROM THE NECK DOWN.        Do not use on open wounds or open sores.  Avoid contact with your eyes, ears, mouth and genitals (private parts).  Wash genitals (private parts) with your normal soap.  6.  Wash thoroughly, paying special attention to the area where your surgery        will be performed.  7.  Thoroughly rinse your body with warm water from the neck down.  8.  DO NOT shower/wash with your normal soap after using and rinsing off  the CHG Soap.  9.  Pat yourself dry with a clean towel.            10.  Wear clean pajamas.            11.  Place clean sheets on your bed the night of your first shower and do not        sleep with pets.  Day of Surgery  Do not apply any lotions/deodorants the morning of surgery.  Please wear clean clothes to the hospital.   Please read over the  fact sheets that you were given.

## 2016-06-09 ENCOUNTER — Encounter (HOSPITAL_COMMUNITY): Payer: Self-pay

## 2016-06-09 ENCOUNTER — Encounter (HOSPITAL_COMMUNITY)
Admission: RE | Admit: 2016-06-09 | Discharge: 2016-06-09 | Disposition: A | Discharge status: Home / Self Care | Payer: Medicare Other | Source: Ambulatory Visit | Attending: Orthopedic Surgery | Admitting: Orthopedic Surgery

## 2016-06-09 DIAGNOSIS — Z01812 Encounter for preprocedural laboratory examination: Secondary | ICD-10-CM | POA: Diagnosis not present

## 2016-06-09 DIAGNOSIS — K219 Gastro-esophageal reflux disease without esophagitis: Secondary | ICD-10-CM | POA: Insufficient documentation

## 2016-06-09 DIAGNOSIS — Z79899 Other long term (current) drug therapy: Secondary | ICD-10-CM | POA: Diagnosis not present

## 2016-06-09 DIAGNOSIS — Z01818 Encounter for other preprocedural examination: Secondary | ICD-10-CM | POA: Diagnosis not present

## 2016-06-09 DIAGNOSIS — Z7982 Long term (current) use of aspirin: Secondary | ICD-10-CM | POA: Insufficient documentation

## 2016-06-09 DIAGNOSIS — I10 Essential (primary) hypertension: Secondary | ICD-10-CM | POA: Diagnosis not present

## 2016-06-09 DIAGNOSIS — M19011 Primary osteoarthritis, right shoulder: Secondary | ICD-10-CM | POA: Insufficient documentation

## 2016-06-09 DIAGNOSIS — Z87891 Personal history of nicotine dependence: Secondary | ICD-10-CM | POA: Insufficient documentation

## 2016-06-09 DIAGNOSIS — I440 Atrioventricular block, first degree: Secondary | ICD-10-CM | POA: Insufficient documentation

## 2016-06-09 DIAGNOSIS — R7303 Prediabetes: Secondary | ICD-10-CM | POA: Insufficient documentation

## 2016-06-09 DIAGNOSIS — G7000 Myasthenia gravis without (acute) exacerbation: Secondary | ICD-10-CM | POA: Insufficient documentation

## 2016-06-09 HISTORY — DX: Adverse effect of unspecified anesthetic, initial encounter: T41.45XA

## 2016-06-09 HISTORY — DX: Gastro-esophageal reflux disease without esophagitis: K21.9

## 2016-06-09 HISTORY — DX: Unspecified osteoarthritis, unspecified site: M19.90

## 2016-06-09 HISTORY — DX: Prediabetes: R73.03

## 2016-06-09 HISTORY — DX: Other complications of anesthesia, initial encounter: T88.59XA

## 2016-06-09 HISTORY — DX: Personal history of other diseases of the digestive system: Z87.19

## 2016-06-09 LAB — CBC
HCT: 40.9 % (ref 39.0–52.0)
Hemoglobin: 13.6 g/dL (ref 13.0–17.0)
MCH: 30.2 pg (ref 26.0–34.0)
MCHC: 33.3 g/dL (ref 30.0–36.0)
MCV: 90.7 fL (ref 78.0–100.0)
PLATELETS: 262 10*3/uL (ref 150–400)
RBC: 4.51 MIL/uL (ref 4.22–5.81)
RDW: 12.8 % (ref 11.5–15.5)
WBC: 7.4 10*3/uL (ref 4.0–10.5)

## 2016-06-09 LAB — BASIC METABOLIC PANEL
Anion gap: 7 (ref 5–15)
BUN: 11 mg/dL (ref 6–20)
CALCIUM: 9.4 mg/dL (ref 8.9–10.3)
CHLORIDE: 103 mmol/L (ref 101–111)
CO2: 27 mmol/L (ref 22–32)
CREATININE: 0.94 mg/dL (ref 0.61–1.24)
GFR calc Af Amer: >60 mL/min (ref >60)
GFR calc non Af Amer: >60 mL/min (ref >60)
Glucose, Bld: 125 mg/dL — ABNORMAL HIGH (ref 65–99)
Potassium: 3.5 mmol/L (ref 3.5–5.1)
Sodium: 137 mmol/L (ref 135–145)

## 2016-06-09 LAB — SURGICAL PCR SCREEN
MRSA, PCR: NEGATIVE
Staphylococcus aureus: NEGATIVE

## 2016-06-09 NOTE — Progress Notes (Signed)
   06/09/16 0927  OBSTRUCTIVE SLEEP APNEA  Have you ever been diagnosed with sleep apnea through a sleep study? No  Do you snore loudly (loud enough to be heard through closed doors)?  1  Do you often feel tired, fatigued, or sleepy during the daytime (such as falling asleep during driving or talking to someone)? 0  Has anyone observed you stop breathing during your sleep? 0  Do you have, or are you being treated for high blood pressure? 1  BMI more than 35 kg/m2? 1  Age > 50 (1-yes) 1  Neck circumference greater than:Male 16 inches or larger, Male 17inches or larger? 1 (7)  Male Gender (Yes=1) 1  Obstructive Sleep Apnea Score 6  Score 5 or greater  Results sent to PCP

## 2016-06-13 NOTE — Progress Notes (Signed)
Anesthesia Chart Review:  Pt is a 75 year old male scheduled for R total shoulder arthroplasty on 06/22/2016 with [name], MD  PCP is [name], MD who is aware of upcoming surgery. Cardiologist is Mertie Moores, MD. Neurologist is Metta Clines, DO.   PMH includes:  HTN, dysrhythmia (palpitations thought to be PVCs), myasthenia gravis (ocular; off medication for it per neuro), prediabetes, GERD. Former smoker. BMI 41  Medications include: ASA, hctz, losartan, prilosec, potassium, simvastatin. Last dose ASA 06/07/16.   Preoperative labs reviewed.    EKG 06/09/16: Sinus rhythm with 1st degree A-V block  Carotid duplex 07/30/14: No significant carotid plaque or stenosis.  Echo 05/29/13:  - Left ventricle: The cavity size was normal. Wall thickness was increased in a pattern of severe LVH. Systolic function was normal. The estimated ejection fraction was in the range of 55% to 65%. Wall motion was normal; there were no regional wall motion abnormalities. Doppler parameters are consistent with abnormal left ventricular relaxation (grade 1 diastolic dysfunction). - Aortic valve: Trivial regurgitation. - Atrial septum: No defect or patent foramen ovale was identified.  Holter monitor 05/22/13: sinus bradycardia with PVCs  If no changes, I anticipate pt can proceed with surgery as scheduled.   Willeen Cass, FNP-BC [facility]/Anesthesiology Phone: [phone] 06/13/2016 2:41 PM

## 2016-06-14 ENCOUNTER — Other Ambulatory Visit: Payer: Self-pay | Admitting: Internal Medicine

## 2016-06-20 ENCOUNTER — Other Ambulatory Visit: Payer: Self-pay | Admitting: Neurology

## 2016-06-20 DIAGNOSIS — K219 Gastro-esophageal reflux disease without esophagitis: Secondary | ICD-10-CM

## 2016-06-21 MED ORDER — CEFAZOLIN SODIUM 10 G IJ SOLR
3.0000 g | INTRAMUSCULAR | Status: AC
  Administered 2016-06-22: 3 g via INTRAVENOUS
  Filled 2016-06-21: qty 3000

## 2016-06-21 NOTE — Anesthesia Preprocedure Evaluation (Signed)
Anesthesia Evaluation  Patient identified by MRN, date of birth, ID band Patient awake    Reviewed: Allergy & Precautions, H&P , Patient's Chart, lab work & pertinent test results, reviewed documented beta blocker date and time   Airway Mallampati: II  TM Distance: >3 FB Neck ROM: full    Dental no notable dental hx.    Pulmonary former smoker,    Pulmonary exam normal breath sounds clear to auscultation       Cardiovascular hypertension, On Medications  Rhythm:regular Rate:Normal     Neuro/Psych    GI/Hepatic   Endo/Other  Morbid obesity  Renal/GU      Musculoskeletal   Abdominal   Peds  Hematology   Anesthesia Other Findings   Reproductive/Obstetrics                            Anesthesia Physical Anesthesia Plan  ASA: III  Anesthesia Plan: General   Post-op Pain Management: GA combined w/ Regional for post-op pain   Induction: Intravenous  Airway Management Planned: Oral ETT and Video Laryngoscope Planned  Additional Equipment:   Intra-op Plan:   Post-operative Plan: Extubation in OR  Informed Consent: I have reviewed the patients History and Physical, chart, labs and discussed the procedure including the risks, benefits and alternatives for the proposed anesthesia with the patient or authorized representative who has indicated his/her understanding and acceptance.   Dental Advisory Given and Dental advisory given  Plan Discussed with: CRNA and Surgeon  Anesthesia Plan Comments: (  Discussed general anesthesia, including possible nausea, instrumentation of airway, sore throat,pulmonary aspiration, etc. I asked if the were any outstanding questions, or  concerns before we proceeded. )       Anesthesia Quick Evaluation

## 2016-06-22 ENCOUNTER — Inpatient Hospital Stay (HOSPITAL_COMMUNITY): Payer: Medicare Other | Admitting: Anesthesiology

## 2016-06-22 ENCOUNTER — Encounter (HOSPITAL_COMMUNITY)
Admission: RE | Disposition: A | Discharge status: Home / Self Care | Payer: Self-pay | Source: Ambulatory Visit | Attending: Orthopedic Surgery

## 2016-06-22 ENCOUNTER — Inpatient Hospital Stay (HOSPITAL_COMMUNITY): Payer: Medicare Other | Admitting: Emergency Medicine

## 2016-06-22 ENCOUNTER — Encounter (HOSPITAL_COMMUNITY): Payer: Self-pay | Admitting: *Deleted

## 2016-06-22 ENCOUNTER — Inpatient Hospital Stay (HOSPITAL_COMMUNITY)
Admission: RE | Admit: 2016-06-22 | Discharge: 2016-06-23 | DRG: 483 | Disposition: A | Discharge status: Home / Self Care | Payer: Medicare Other | Source: Ambulatory Visit | Attending: Orthopedic Surgery | Admitting: Orthopedic Surgery

## 2016-06-22 DIAGNOSIS — Z79899 Other long term (current) drug therapy: Secondary | ICD-10-CM

## 2016-06-22 DIAGNOSIS — Z7982 Long term (current) use of aspirin: Secondary | ICD-10-CM | POA: Diagnosis not present

## 2016-06-22 DIAGNOSIS — Z96611 Presence of right artificial shoulder joint: Secondary | ICD-10-CM

## 2016-06-22 DIAGNOSIS — Z87891 Personal history of nicotine dependence: Secondary | ICD-10-CM

## 2016-06-22 DIAGNOSIS — G8918 Other acute postprocedural pain: Secondary | ICD-10-CM | POA: Diagnosis not present

## 2016-06-22 DIAGNOSIS — Z96653 Presence of artificial knee joint, bilateral: Secondary | ICD-10-CM | POA: Diagnosis not present

## 2016-06-22 DIAGNOSIS — I10 Essential (primary) hypertension: Secondary | ICD-10-CM | POA: Diagnosis present

## 2016-06-22 DIAGNOSIS — M19011 Primary osteoarthritis, right shoulder: Secondary | ICD-10-CM | POA: Diagnosis not present

## 2016-06-22 DIAGNOSIS — Z96619 Presence of unspecified artificial shoulder joint: Secondary | ICD-10-CM

## 2016-06-22 HISTORY — PX: TOTAL SHOULDER ARTHROPLASTY: SHX126

## 2016-06-22 SURGERY — ARTHROPLASTY, SHOULDER, TOTAL
Anesthesia: General | Site: Shoulder | Laterality: Right

## 2016-06-22 MED ORDER — SUCCINYLCHOLINE CHLORIDE 200 MG/10ML IV SOSY
PREFILLED_SYRINGE | INTRAVENOUS | Status: AC
  Filled 2016-06-22: qty 10

## 2016-06-22 MED ORDER — PHENYLEPHRINE HCL 10 MG/ML IJ SOLN
INTRAMUSCULAR | Status: DC | PRN
  Administered 2016-06-22 (×3): 80 ug via INTRAVENOUS

## 2016-06-22 MED ORDER — PROPOFOL 10 MG/ML IV BOLUS
INTRAVENOUS | Status: AC
  Filled 2016-06-22: qty 20

## 2016-06-22 MED ORDER — DEXAMETHASONE SODIUM PHOSPHATE 10 MG/ML IJ SOLN
INTRAMUSCULAR | Status: AC
  Filled 2016-06-22: qty 1

## 2016-06-22 MED ORDER — CEFAZOLIN SODIUM-DEXTROSE 2-4 GM/100ML-% IV SOLN
2.0000 g | Freq: Four times a day (QID) | INTRAVENOUS | Status: AC
  Administered 2016-06-22 – 2016-06-23 (×3): 2 g via INTRAVENOUS
  Filled 2016-06-22 (×3): qty 100

## 2016-06-22 MED ORDER — DIAZEPAM 5 MG PO TABS: 2.5000 mg | ORAL_TABLET | Freq: Four times a day (QID) | ORAL | Status: DC | PRN

## 2016-06-22 MED ORDER — KETOROLAC TROMETHAMINE 15 MG/ML IJ SOLN
7.5000 mg | Freq: Four times a day (QID) | INTRAMUSCULAR | Status: AC
  Administered 2016-06-22 – 2016-06-23 (×4): 7.5 mg via INTRAVENOUS
  Filled 2016-06-22 (×4): qty 1

## 2016-06-22 MED ORDER — MENTHOL 3 MG MT LOZG: 1.0000 | LOZENGE | OROMUCOSAL | Status: DC | PRN

## 2016-06-22 MED ORDER — ONDANSETRON HCL 4 MG PO TABS
4.0000 mg | ORAL_TABLET | Freq: Four times a day (QID) | ORAL | Status: DC | PRN
  Administered 2016-06-23: 4 mg via ORAL
  Filled 2016-06-22: qty 1

## 2016-06-22 MED ORDER — PHENYLEPHRINE 40 MCG/ML (10ML) SYRINGE FOR IV PUSH (FOR BLOOD PRESSURE SUPPORT)
PREFILLED_SYRINGE | INTRAVENOUS | Status: AC
  Filled 2016-06-22: qty 10

## 2016-06-22 MED ORDER — POLYETHYLENE GLYCOL 3350 17 G PO PACK: 17.0000 g | PACK | Freq: Every day | ORAL | Status: DC | PRN

## 2016-06-22 MED ORDER — DOCUSATE SODIUM 100 MG PO CAPS
100.0000 mg | ORAL_CAPSULE | Freq: Two times a day (BID) | ORAL | Status: DC
  Administered 2016-06-22 – 2016-06-23 (×2): 100 mg via ORAL
  Filled 2016-06-22 (×2): qty 1

## 2016-06-22 MED ORDER — ACETAMINOPHEN 650 MG RE SUPP: 650.0000 mg | Freq: Four times a day (QID) | RECTAL | Status: DC | PRN

## 2016-06-22 MED ORDER — PROPOFOL 10 MG/ML IV BOLUS
INTRAVENOUS | Status: DC | PRN
  Administered 2016-06-22: 150 mg via INTRAVENOUS
  Administered 2016-06-22: 50 mg via INTRAVENOUS

## 2016-06-22 MED ORDER — EPHEDRINE 5 MG/ML INJ
INTRAVENOUS | Status: AC
  Filled 2016-06-22: qty 10

## 2016-06-22 MED ORDER — DIPHENHYDRAMINE HCL 12.5 MG/5ML PO ELIX: 12.5000 mg | ORAL_SOLUTION | ORAL | Status: DC | PRN

## 2016-06-22 MED ORDER — 0.9 % SODIUM CHLORIDE (POUR BTL) OPTIME
TOPICAL | Status: DC | PRN
  Administered 2016-06-22: 1000 mL

## 2016-06-22 MED ORDER — ACETAMINOPHEN 325 MG PO TABS: 650.0000 mg | ORAL_TABLET | Freq: Four times a day (QID) | ORAL | Status: DC | PRN

## 2016-06-22 MED ORDER — FENTANYL CITRATE (PF) 100 MCG/2ML IJ SOLN
INTRAMUSCULAR | Status: DC | PRN
  Administered 2016-06-22 (×3): 50 ug via INTRAVENOUS

## 2016-06-22 MED ORDER — LOSARTAN POTASSIUM 50 MG PO TABS
100.0000 mg | ORAL_TABLET | Freq: Every day | ORAL | Status: DC
  Administered 2016-06-22 – 2016-06-23 (×2): 100 mg via ORAL
  Filled 2016-06-22 (×2): qty 2

## 2016-06-22 MED ORDER — LIDOCAINE HCL (CARDIAC) 20 MG/ML IV SOLN
INTRAVENOUS | Status: DC | PRN
  Administered 2016-06-22: 100 mg via INTRAVENOUS

## 2016-06-22 MED ORDER — PHENOL 1.4 % MT LIQD
1.0000 | OROMUCOSAL | Status: DC | PRN
Start: 2016-06-22 — End: 2016-06-23

## 2016-06-22 MED ORDER — HYDROCHLOROTHIAZIDE 25 MG PO TABS
25.0000 mg | ORAL_TABLET | Freq: Every day | ORAL | Status: DC
Start: 2016-06-22 — End: 2016-06-23
  Administered 2016-06-22 – 2016-06-23 (×2): 25 mg via ORAL
  Filled 2016-06-22 (×2): qty 1

## 2016-06-22 MED ORDER — LACTATED RINGERS IV SOLN
INTRAVENOUS | Status: DC | PRN
  Administered 2016-06-22 (×2): via INTRAVENOUS

## 2016-06-22 MED ORDER — MIDAZOLAM HCL 5 MG/5ML IJ SOLN
INTRAMUSCULAR | Status: DC | PRN
  Administered 2016-06-22: 2 mg via INTRAVENOUS

## 2016-06-22 MED ORDER — ONDANSETRON HCL 4 MG/2ML IJ SOLN
INTRAMUSCULAR | Status: DC | PRN
  Administered 2016-06-22: 4 mg via INTRAVENOUS

## 2016-06-22 MED ORDER — EPHEDRINE SULFATE 50 MG/ML IJ SOLN
INTRAMUSCULAR | Status: DC | PRN
  Administered 2016-06-22 (×3): 10 mg via INTRAVENOUS

## 2016-06-22 MED ORDER — METOCLOPRAMIDE HCL 5 MG/ML IJ SOLN: 5.0000 mg | Freq: Three times a day (TID) | INTRAMUSCULAR | Status: DC | PRN

## 2016-06-22 MED ORDER — BISACODYL 5 MG PO TBEC: 5.0000 mg | DELAYED_RELEASE_TABLET | Freq: Every day | ORAL | Status: DC | PRN

## 2016-06-22 MED ORDER — ONDANSETRON HCL 4 MG/2ML IJ SOLN
INTRAMUSCULAR | Status: AC
  Filled 2016-06-22: qty 2

## 2016-06-22 MED ORDER — TRANEXAMIC ACID 1000 MG/10ML IV SOLN
1000.0000 mg | INTRAVENOUS | Status: AC
  Administered 2016-06-22: 1000 mg via INTRAVENOUS
  Filled 2016-06-22: qty 10

## 2016-06-22 MED ORDER — ALUM & MAG HYDROXIDE-SIMETH 200-200-20 MG/5ML PO SUSP: 30.0000 mL | ORAL | Status: DC | PRN

## 2016-06-22 MED ORDER — FENTANYL CITRATE (PF) 100 MCG/2ML IJ SOLN
INTRAMUSCULAR | Status: AC
  Filled 2016-06-22: qty 2

## 2016-06-22 MED ORDER — DEXAMETHASONE SODIUM PHOSPHATE 10 MG/ML IJ SOLN
INTRAMUSCULAR | Status: DC | PRN
  Administered 2016-06-22: 10 mg via INTRAVENOUS

## 2016-06-22 MED ORDER — LACTATED RINGERS IV SOLN
INTRAVENOUS | Status: DC
  Administered 2016-06-22 (×2): via INTRAVENOUS

## 2016-06-22 MED ORDER — MIDAZOLAM HCL 2 MG/2ML IJ SOLN
INTRAMUSCULAR | Status: AC
  Filled 2016-06-22: qty 2

## 2016-06-22 MED ORDER — ONDANSETRON HCL 4 MG/2ML IJ SOLN
4.0000 mg | Freq: Four times a day (QID) | INTRAMUSCULAR | Status: DC | PRN
  Administered 2016-06-23: 4 mg via INTRAVENOUS
  Filled 2016-06-22: qty 2

## 2016-06-22 MED ORDER — FENTANYL CITRATE (PF) 100 MCG/2ML IJ SOLN
INTRAMUSCULAR | Status: AC
Start: 2016-06-22 — End: 2016-06-22
  Filled 2016-06-22: qty 2

## 2016-06-22 MED ORDER — SUCCINYLCHOLINE CHLORIDE 20 MG/ML IJ SOLN
INTRAMUSCULAR | Status: DC | PRN
  Administered 2016-06-22: 140 mg via INTRAVENOUS

## 2016-06-22 MED ORDER — FENTANYL CITRATE (PF) 100 MCG/2ML IJ SOLN: 25.0000 ug | INTRAMUSCULAR | Status: DC | PRN

## 2016-06-22 MED ORDER — FLEET ENEMA 7-19 GM/118ML RE ENEM: 1.0000 | ENEMA | Freq: Once | RECTAL | Status: DC | PRN

## 2016-06-22 MED ORDER — HYDROMORPHONE HCL 1 MG/ML IJ SOLN: 1.0000 mg | INTRAMUSCULAR | Status: DC | PRN

## 2016-06-22 MED ORDER — LIDOCAINE 2% (20 MG/ML) 5 ML SYRINGE
INTRAMUSCULAR | Status: AC
  Filled 2016-06-22: qty 5

## 2016-06-22 MED ORDER — POTASSIUM CHLORIDE CRYS ER 10 MEQ PO TBCR
10.0000 meq | EXTENDED_RELEASE_TABLET | Freq: Every day | ORAL | Status: DC
Start: 2016-06-22 — End: 2016-06-23
  Administered 2016-06-22 – 2016-06-23 (×2): 10 meq via ORAL
  Filled 2016-06-22 (×2): qty 1

## 2016-06-22 MED ORDER — DEXTROSE 5 % IV SOLN
INTRAVENOUS | Status: DC | PRN
  Administered 2016-06-22: 30 ug/min via INTRAVENOUS

## 2016-06-22 MED ORDER — CHLORHEXIDINE GLUCONATE 4 % EX LIQD: 60.0000 mL | Freq: Once | CUTANEOUS | Status: DC

## 2016-06-22 MED ORDER — METOCLOPRAMIDE HCL 5 MG PO TABS: 5.0000 mg | ORAL_TABLET | Freq: Three times a day (TID) | ORAL | Status: DC | PRN

## 2016-06-22 MED ORDER — OXYCODONE HCL 5 MG PO TABS
5.0000 mg | ORAL_TABLET | ORAL | Status: DC | PRN
  Administered 2016-06-22: 10 mg via ORAL
  Administered 2016-06-22: 5 mg via ORAL
  Administered 2016-06-23: 10 mg via ORAL
  Filled 2016-06-22: qty 1
  Filled 2016-06-22 (×2): qty 2

## 2016-06-22 SURGICAL SUPPLY — 63 items
BLADE SAW SGTL 83.5X18.5 (BLADE) ×3 IMPLANT
CEMENT BONE DEPUY (Cement) ×3 IMPLANT
COVER SURGICAL LIGHT HANDLE (MISCELLANEOUS) ×3 IMPLANT
DERMABOND ADHESIVE PROPEN (GAUZE/BANDAGES/DRESSINGS) ×2
DERMABOND ADVANCED (GAUZE/BANDAGES/DRESSINGS) ×2
DERMABOND ADVANCED .7 DNX12 (GAUZE/BANDAGES/DRESSINGS) ×1 IMPLANT
DERMABOND ADVANCED .7 DNX6 (GAUZE/BANDAGES/DRESSINGS) ×1 IMPLANT
DRAPE ORTHO SPLIT 77X108 STRL (DRAPES) ×4
DRAPE SURG 17X11 SM STRL (DRAPES) ×3 IMPLANT
DRAPE SURG ORHT 6 SPLT 77X108 (DRAPES) ×2 IMPLANT
DRAPE U-SHAPE 47X51 STRL (DRAPES) ×3 IMPLANT
DRESSING AQUACEL AG SP 3.5X10 (GAUZE/BANDAGES/DRESSINGS) ×1 IMPLANT
DRSG AQUACEL AG ADV 3.5X10 (GAUZE/BANDAGES/DRESSINGS) ×3 IMPLANT
DRSG AQUACEL AG SP 3.5X10 (GAUZE/BANDAGES/DRESSINGS) ×3
DURAPREP 26ML APPLICATOR (WOUND CARE) ×6 IMPLANT
ELECT BLADE 4.0 EZ CLEAN MEGAD (MISCELLANEOUS) ×3
ELECT CAUTERY BLADE 6.4 (BLADE) ×3 IMPLANT
ELECT REM PT RETURN 9FT ADLT (ELECTROSURGICAL) ×3
ELECTRODE BLDE 4.0 EZ CLN MEGD (MISCELLANEOUS) ×1 IMPLANT
ELECTRODE REM PT RTRN 9FT ADLT (ELECTROSURGICAL) ×1 IMPLANT
FACESHIELD WRAPAROUND (MASK) ×9 IMPLANT
GLENOID WITH CLEAT MEDIUM (Shoulder) ×3 IMPLANT
GLOVE BIO SURGEON STRL SZ7.5 (GLOVE) ×3 IMPLANT
GLOVE BIO SURGEON STRL SZ8 (GLOVE) ×3 IMPLANT
GLOVE EUDERMIC 7 POWDERFREE (GLOVE) ×3 IMPLANT
GLOVE SS BIOGEL STRL SZ 7.5 (GLOVE) ×1 IMPLANT
GLOVE SUPERSENSE BIOGEL SZ 7.5 (GLOVE) ×2
GOWN STRL REUS W/ TWL LRG LVL3 (GOWN DISPOSABLE) ×1 IMPLANT
GOWN STRL REUS W/ TWL XL LVL3 (GOWN DISPOSABLE) ×2 IMPLANT
GOWN STRL REUS W/TWL LRG LVL3 (GOWN DISPOSABLE) ×2
GOWN STRL REUS W/TWL XL LVL3 (GOWN DISPOSABLE) ×4
HEAD HUMERAL UNIVERS II 52/20 (Head) ×3 IMPLANT
KIT BASIN OR (CUSTOM PROCEDURE TRAY) ×3 IMPLANT
KIT ROOM TURNOVER OR (KITS) ×3 IMPLANT
MANIFOLD NEPTUNE II (INSTRUMENTS) ×3 IMPLANT
NDL SUT .5 MAYO 1.404X.05X (NEEDLE) ×1 IMPLANT
NDL SUT 6 .5 CRC .975X.05 MAYO (NEEDLE) ×1 IMPLANT
NEEDLE MAYO TAPER (NEEDLE) ×4
NS IRRIG 1000ML POUR BTL (IV SOLUTION) ×3 IMPLANT
PACK SHOULDER (CUSTOM PROCEDURE TRAY) ×3 IMPLANT
PAD ARMBOARD 7.5X6 YLW CONV (MISCELLANEOUS) ×6 IMPLANT
PIN SET UNIVERSAL ECLIPSE ×3 IMPLANT
RESTRAINT HEAD UNIVERSAL NS (MISCELLANEOUS) ×3 IMPLANT
SLING ARM FOAM STRAP LRG (SOFTGOODS) IMPLANT
SLING ARM FOAM STRAP XLG (SOFTGOODS) ×3 IMPLANT
SLING ARM XL FOAM STRAP (SOFTGOODS) ×3 IMPLANT
SMARTMIX MINI TOWER (MISCELLANEOUS) ×3
SPONGE LAP 18X18 X RAY DECT (DISPOSABLE) ×3 IMPLANT
SPONGE LAP 4X18 X RAY DECT (DISPOSABLE) ×3 IMPLANT
STEM HUMERAL UNI APEX 10MM (Shoulder) ×3 IMPLANT
SUCTION FRAZIER HANDLE 10FR (MISCELLANEOUS) ×2
SUCTION TUBE FRAZIER 10FR DISP (MISCELLANEOUS) ×1 IMPLANT
SUT FIBERWIRE #2 38 T-5 BLUE (SUTURE) ×12
SUT MNCRL AB 3-0 PS2 18 (SUTURE) ×3 IMPLANT
SUT MON AB 2-0 CT1 36 (SUTURE) ×3 IMPLANT
SUT VIC AB 1 CT1 27 (SUTURE) ×2
SUT VIC AB 1 CT1 27XBRD ANBCTR (SUTURE) ×1 IMPLANT
SUTURE FIBERWR #2 38 T-5 BLUE (SUTURE) ×4 IMPLANT
SYR CONTROL 10ML LL (SYRINGE) IMPLANT
TOWEL OR 17X24 6PK STRL BLUE (TOWEL DISPOSABLE) ×3 IMPLANT
TOWEL OR 17X26 10 PK STRL BLUE (TOWEL DISPOSABLE) ×3 IMPLANT
TOWER SMARTMIX MINI (MISCELLANEOUS) ×1 IMPLANT
WATER STERILE IRR 1000ML POUR (IV SOLUTION) ×3 IMPLANT

## 2016-06-22 NOTE — Transfer of Care (Signed)
Immediate Anesthesia Transfer of Care Note  Patient: [name] [name]  Procedure(s) Performed: Procedure(s): RIGHT TOTAL SHOULDER ARTHROPLASTY (Right)  Patient Location: PACU  Anesthesia Type:General and Regional  Level of Consciousness: responds to stimulation  Airway & Oxygen Therapy: Patient Spontanous Breathing and Patient connected to nasal cannula oxygen  Post-op Assessment: Report given to RN and Post -op Vital signs reviewed and stable  Post vital signs: Reviewed and stable  Last Vitals:  Vitals:   06/22/16 0607  BP: 138/62  Pulse: (!) 59  Resp: 18  Temp: 36.7 C    Last Pain:  Vitals:   06/22/16 0607  TempSrc: Oral         Complications: No apparent anesthesia complications

## 2016-06-22 NOTE — Anesthesia Postprocedure Evaluation (Signed)
Anesthesia Post Note  Patient: [name] [name]  Procedure(s) Performed: Procedure(s) (LRB): RIGHT TOTAL SHOULDER ARTHROPLASTY (Right)  Patient location during evaluation: PACU Anesthesia Type: General Level of consciousness: sedated Pain management: satisfactory to patient Vital Signs Assessment: post-procedure vital signs reviewed and stable Respiratory status: spontaneous breathing Cardiovascular status: stable Anesthetic complications: no    Last Vitals:  Vitals:   06/22/16 1040 06/22/16 1055  BP: 122/74 121/74  Pulse: 66 66  Resp: 13 13  Temp:  36.4 C    Last Pain:  Vitals:   06/22/16 1055  TempSrc:   PainSc: 0-No pain                 Velera Lansdale EDWARD

## 2016-06-22 NOTE — Op Note (Signed)
06/22/2016  9:30 AM  PATIENT:   [name] [name]  74 y.o. male  PRE-OPERATIVE DIAGNOSIS:  RIGHT SHOULDER OA  POST-OPERATIVE DIAGNOSIS:  same  PROCEDURE:  R TSA #10 stem,52x20 head, medium glenoid  SURGEON:  Kean Gautreau, Metta Clines M.D.  ASSISTANTS: Shuford pac   ANESTHESIA:   GET + ISB  EBL: 100  SPECIMEN:  none  Drains: none   PATIENT DISPOSITION:  PACU - hemodynamically stable.    PLAN OF CARE: Admit for overnight observation  Dictation# [id number]   Contact # [phone]

## 2016-06-22 NOTE — Anesthesia Procedure Notes (Signed)
Procedure Name: Intubation Date/Time: 06/22/2016 7:38 AM Performed by: Manuela Schwartz B Pre-anesthesia Checklist: Patient identified, Emergency Drugs available, Suction available, Patient being monitored and Timeout performed Patient Re-evaluated:Patient Re-evaluated prior to inductionOxygen Delivery Method: Circle system utilized Preoxygenation: Pre-oxygenation with 100% oxygen Intubation Type: IV induction and Rapid sequence Laryngoscope Size: Mac and 4 Grade View: Grade III Tube type: Oral Tube size: 7.5 mm Number of attempts: 1 Airway Equipment and Method: Stylet Placement Confirmation: ETT inserted through vocal cords under direct vision,  positive ETCO2 and breath sounds checked- equal and bilateral Secured at: 23 cm Tube secured with: Tape Dental Injury: Teeth and Oropharynx as per pre-operative assessment

## 2016-06-22 NOTE — Anesthesia Procedure Notes (Signed)
Anesthesia Regional Block:  Interscalene brachial plexus block  Pre-Anesthetic Checklist: ,, timeout performed, Correct Patient, Correct Site, Correct Laterality, Correct Procedure, Correct Position,,,, pre-op evaluation, at surgeon's request and post-op pain management  Laterality: Right  Prep: chloraprep       Needles:   Needle Type: Echogenic Needle     Needle Length: 5cm 5 cm Needle Gauge: 21 and 21 G    Additional Needles:  Procedures: ultrasound guided (picture in chart) Interscalene brachial plexus block Narrative:  Injection made incrementally with aspirations every 5 mL. Anesthesiologist: Lyndle Herrlich  Additional Notes: 25cc .5% Naropin

## 2016-06-22 NOTE — H&P (Signed)
[name] [name]    Chief Complaint: RIGHT SHOULDER OA HPI: The patient is a 75 y.o. male with end stage right shoulder OA  Past Medical History:  Diagnosis Date  . Arthritis   . Complication of anesthesia     had spinal with knee surgeries  -"hr too low" for general  . Dysrhythmia   . GERD (gastroesophageal reflux disease)    hx no problems now  . History of hiatal hernia    ?20 yrs ago  . Hyperopia 2016  . Hypertension   . MG, ocular (myasthenia gravis) ([place])    ? of   . Prediabetes   . Presbyopia OU 2016    Past Surgical History:  Procedure Laterality Date  . HERNIA REPAIR     bilateral inguinal hernia  . HERNIA REPAIR     umbilical  . JOINT REPLACEMENT  2009   right knee  . MIDDLE EAR SURGERY Right    ear -patched hole in ear drum  . TOTAL KNEE ARTHROPLASTY  09/10/2012   Procedure: TOTAL KNEE ARTHROPLASTY;  Surgeon: Tobi Bastos, MD;  Location: [facility];  Service: Orthopedics;  Laterality: Left;    Family History  Problem Relation Age of Onset  . Diabetes Brother   . Colon cancer Neg Hx   . Prostate cancer Neg Hx   . Stroke Neg Hx   . CAD Neg Hx     Social History:  reports that he quit smoking about 50 years ago. His smoking use included Cigarettes. He has a 1.00 pack-year smoking history. He has never used smokeless tobacco. He reports that he does not drink alcohol or use drugs.   Medications Prior to Admission  Medication Sig Dispense Refill  . acetaminophen (TYLENOL) 650 MG CR tablet Take 650 mg by mouth daily.     Marland Kitchen aspirin 81 MG tablet Take 81 mg by mouth daily.    . fish oil-omega-3 fatty acids 1000 MG capsule Take 1 g by mouth daily.    . hydrochlorothiazide (HYDRODIURIL) 25 MG tablet Take 1 tablet (25 mg total) by mouth daily. 90 tablet 1  . losartan (COZAAR) 100 MG tablet Take 1 tablet (100 mg total) by mouth daily. 90 tablet 2  . potassium chloride (KLOR-CON M10) 10 MEQ tablet Take 1 tablet (10 mEq total) by mouth daily. 90 tablet 2  .  simvastatin (ZOCOR) 40 MG tablet Take 40 mg by mouth daily.     Marland Kitchen omeprazole (PRILOSEC) 20 MG capsule Take 1 capsule (20 mg total) by mouth daily. (Patient not taking: Reported on 05/26/2016) 90 capsule 2     Physical Exam: right shoulder with painful and restricted motion as noted at recent office visits  Vitals  Temp:  [98 F (36.7 C)] 98 F (36.7 C) (08/31 0607) Pulse Rate:  [59] 59 (08/31 0607) Resp:  [18] 18 (08/31 0607) BP: (138)/(62) 138/62 (08/31 0607) SpO2:  [99 %] 99 % (08/31 0607) Weight:  [135.6 kg (299 lb)] 135.6 kg (299 lb) (08/31 0601)  Assessment/Plan  Impression: RIGHT SHOULDER OA  Plan of Action: Procedure(s): RIGHT TOTAL SHOULDER ARTHROPLASTY  Sheral Pfahler M Fusae Florio 06/22/2016, 7:26 AM Contact # [phone]

## 2016-06-23 MED ORDER — DIAZEPAM 5 MG PO TABS: 2.5000 mg | ORAL_TABLET | Freq: Four times a day (QID) | ORAL | 1 refills | Status: DC | PRN

## 2016-06-23 MED ORDER — OXYCODONE-ACETAMINOPHEN 5-325 MG PO TABS: 1.0000 | ORAL_TABLET | ORAL | 0 refills | Status: DC | PRN

## 2016-06-23 MED ORDER — ONDANSETRON HCL 4 MG PO TABS: 4.0000 mg | ORAL_TABLET | Freq: Three times a day (TID) | ORAL | 0 refills | Status: DC | PRN

## 2016-06-23 NOTE — Op Note (Signed)
[name] [name], [name] [name] NO.:  [id number]  MEDICAL RECORD NO.:  [id number]  LOCATION:  [id number]                        FACILITY:  [facility]  PHYSICIAN:  Metta Clines. Jamile Rekowski, M.D.  DATE OF BIRTH:  Apr 10, 1941  DATE OF PROCEDURE:  06/22/2016 DATE OF DISCHARGE:                              OPERATIVE REPORT   PREOPERATIVE DIAGNOSIS:  End-stage right shoulder osteoarthritis.  POSTOPERATIVE DIAGNOSIS:  End-stage right shoulder osteoarthritis.  PROCEDURE:  Right total shoulder arthroplasty utilizing a press-fit size 10 Arthrex stem with a 52 x 20 head and a cemented 8 medium glenoid.  SURGEON:  Metta Clines. Wagner Tanzi, M.D.  Terrence DupontOlivia Mackie A. Shuford, P.A.-C.  ANESTHESIA:  General endotracheal as well as an interscalene block.  ESTIMATED BLOOD LOSS:  100 mL.  DRAINS:  None.  HISTORY:  Mr. Mcglathery is a 75 year old gentleman who has had chronic and progressive increasing right shoulder pain related to glenohumeral arthritis.  Plain radiographs confirmed loss of joint space, subchondral sclerosis, deformity of the humeral head and peripheral osteophyte formation.  Due to his increasing pain and functional limitations, he was brought to the operating room at this time for planned right total shoulder arthroplasty.  Preoperatively, I counseled Mr. Vill regarding treatment options and potential risks versus benefits thereof.  Possible surgical complications were reviewed including bleeding, infection, neurovascular injury, persistent pain, loss of motion, anesthetic complication, failure of the implant and possible need for additional surgery.  He understands and accepts and agrees with our planned procedure.  PROCEDURE IN DETAIL:  After undergoing routine preop evaluation, the patient received prophylactic antibiotics and an interscalene block was established in the holding area by the Anesthesia Department.  Placed supine on the operating table and underwent smooth induction of  a general endotracheal anesthesia.  Placed in the beach-chair position and appropriately padded and protected.  The right shoulder girdle region was then sterilely prepped and draped in standard fashion.  Time-out was called.  An anterior deltopectoral approach to the right shoulder was made through a 10-cm incision.  Skin flaps were elevated and electrocautery was used for hemostasis.  Dissection carried deeply, deltopectoral interval identified.  The vein was taken laterally at the deltoid, this interval was then developed bluntly and the upper centimeter and half of pec major was then tenotomized to enhance exposure.  Adhesion was divided beneath the deltoid.  Retractors were placed.  The conjoined tendon was mobilized and retracted medially.  We then unroofed the biceps tendon.  This was tenotomized for later tenodesis.  Then split the rotator cuff along the rotator interval from the apex of the bicipital groove to the base of the coracoid and then tenotomized the subscapularis way from the lesser tuberosity leaving an approximately 1-1.5 cm cuff of tissue for later repair.  The free margin of the subscapularis was then tagged with a series of #2 grasping FiberWire sutures.  We then divided the capsular tissues from the anterior-inferior and inferior aspect of the humeral neck allowing complete delivery of the humeral head through the wound.  We then carefully protected the rotator cuff, outlined to proposed humeral head resection 135 degrees and this was then performed with an oscillating saw  making the native retroversion of approximately 30 degrees.  We then removed the osteophytes on the anterior and inferior margins of the humeral neck.  The canal was then opened up with hand reaming and then we broached up to size 10, which showed excellent fit and fixation. Size 9 stem with a metal cap was then placed into the humeral metaphysis and at this point, we exposed our glenoid with  combination of Fukuda, pitchfork and snake tongue retractors.  I performed a circumferential labral resection and also mobilized the subscap so that it was freely mobile.  Gained complete visualization of the periphery of the glenoid. It was sized with medium showing best fit.  A guidepin was placed in the center of the glenoid.  We reamed the glenoid to a stable subchondral bony bed, removed residual soft tissue bony debris at the margins of the glenoid.  We then placed our central drill hole followed by the guide for superior and inferior holes and slot respectively.  We then broached for the glenoid and a trial glenoid showed excellent fit and fixation. At this point, the glenoid was irrigated, cleaned and dried.  Cement was mixed, introduced into the superior and inferior pegs and slots respectively and then we impacted our size medium glenoid with excellent fit and fixation.  Attention was then returned back to our humeral shaft where we cleaned the metaphysis, we impacted the size 10 stem with excellent fit and fixation.  The proximal locking screws were appropriately tensioned.  We then performed a series of trial reductions and the 52 x 20 head showed the best coverage of the bone with soft tissue balance much to our satisfaction.  At this point, the [facility] taper was then cleaned, we impacted the final 52 x 20 head.  Final reduction was performed.  The joint was copiously irrigated.  We very pleased with the overall soft tissue balance, good shoulder motion with 50% translation of the humeral head and glenoid.  At this point, the subscap was then repaired back to the lesser tuberosity through our soft tissue cuff using the #2 FiberWires and also repaired the rotator interval with a pair of figure-of-eight #2 FiberWire sutures.  The biceps tendon was then tenodesed at the border of the pec major using #2 FiberWire.  The wound again was irrigated, cleaned and dried.  The  deltopectoral interval was then reapproximated with a series of figure-of-eight #1 Vicryl sutures.  2-0 Vicryl was used for the subcu layer, intracuticular 3-0 Monocryl for the skin followed by Dermabond and Aquacel dressing. The right arm was placed in the sling.  The patient was then awakened, extubated, and taken to the recovery room in stable condition.  Jenetta Loges, PA-C, was used as an Environmental consultant throughout this case, was essential for help with positioning the patient, positioning the extremity, management of the tissue retractors, implantation of the prosthesis, wound closure and intraoperative decision making.     Metta Clines. Jackie Russman, M.D.     KMS/MEDQ  D:  06/22/2016  T:  06/23/2016  Job:  [id number]

## 2016-06-23 NOTE — Discharge Instructions (Signed)
° °  Metta Clines. Supple, M.D., F.A.A.O.S. Orthopaedic Surgery Specializing in Arthroscopic and Reconstructive Surgery of the Shoulder and Knee [phone] [address] [place], [place] [address] - Fax [phone]   POST-OP TOTAL SHOULDER REPLACEMENT INSTRUCTIONS  1. Call the office at [phone] to schedule your first post-op appointment 10-14 days from the date of your surgery.  2. The bandage over your incision is waterproof. You may begin showering with this dressing on. You may leave this dressing on until first follow up appointment within 2 weeks. We prefer you leave this dressing in place until follow up however after 5-7 days if you are having itching or skin irritation and would like to remove it you may do so. Go slow and tug at the borders gently to break the bond the dressing has with the skin. At this point if there is no drainage it is okay to go without a bandage or you may cover it with a light guaze and tape. You can also expect significant bruising around your shoulder that will drift down your arm and into your chest wall. This is very normal and should resolve over several days.   3. Wear your sling/immobilizer at all times except to perform the exercises below or to occasionally let your arm dangle by your side to stretch your elbow. You also need to sleep in your sling immobilizer until instructed otherwise.  4. Range of motion to your elbow, wrist, and hand are encouraged 3-5 times daily. Exercise to your hand and fingers helps to reduce swelling you may experience.  5. Utilize ice to the shoulder 3-5 times minimum a day and additionally if you are experiencing pain.  6. Prescriptions for a pain medication and a muscle relaxant are provided for you. It is recommended that if you are experiencing pain that you pain medication alone is not controlling, add the muscle relaxant along with the pain medication which can give additional pain relief. The first 1-2 days  is generally the most severe of your pain and then should gradually decrease. As your pain lessens it is recommended that you decrease your use of the pain medications to an "as needed basis'" only and to always comply with the recommended dosages of the pain medications.  7. Pain medications can produce constipation along with their use. If you experience this, the use of an over the counter stool softener or laxative daily is recommended.  As soon as you can, it is recommended you wean off of the prescription pain meds and muscle relaxant and you may take over the counter tylenol or ibuprofen if you tolerate them.   8. For additional questions or concerns, please do not hesitate to call the office. If after hours there is an answering service to forward your concerns to the physician on call.  POST-OP EXERCISES  Pendulum Exercises  Perform pendulum exercises while standing and bending at the waist. Support your uninvolved arm on a table or chair and allow your operated arm to hang freely. Make sure to do these exercises passively - not using you shoulder muscles.  Repeat 20 times. Do 3 sessions per day.

## 2016-06-23 NOTE — Progress Notes (Signed)
[name] [name] to be D/C'd Home per MD order.  Discussed with the patient and all questions fully answered.  VSS, Skin clean, dry and intact without evidence of skin break down, no evidence of skin tears noted. IV catheter discontinued intact. Site without signs and symptoms of complications. Dressing and pressure applied.  An After Visit Summary was printed and given to the patient. Patient received prescription.  D/c education completed with patient/family including follow up instructions, medication list, d/c activities limitations if indicated, with other d/c instructions as indicated by MD - patient able to verbalize understanding, all questions fully answered.   Patient instructed to return to ED, call 911, or call MD for any changes in condition.   Patient escorted via [place], and D/C home via private auto.  Jerry Caras 06/23/2016 12:13 PM

## 2016-06-23 NOTE — Discharge Summary (Signed)
PATIENT ID:      [name] [name]  MRN:     [id number] DOB/AGE:    [birth year] / 75 y.o.     DISCHARGE SUMMARY  ADMISSION DATE:    06/22/2016 DISCHARGE DATE:    ADMISSION DIAGNOSIS: RIGHT SHOULDER OA Past Medical History:  Diagnosis Date  . Arthritis   . Complication of anesthesia     had spinal with knee surgeries  -"hr too low" for general  . Dysrhythmia   . GERD (gastroesophageal reflux disease)    hx no problems now  . History of hiatal hernia    ?20 yrs ago  . Hyperopia 2016  . Hypertension   . MG, ocular (myasthenia gravis) ([place])    ? of   . Prediabetes   . Presbyopia OU 2016    DISCHARGE DIAGNOSIS:   Active Problems:   S/P shoulder replacement   PROCEDURE: Procedure(s): RIGHT TOTAL SHOULDER ARTHROPLASTY on 06/22/2016  CONSULTS:    HISTORY:  See H&P in chart.  HOSPITAL COURSE:  [name] [name] is a 75 y.o. admitted on 06/22/2016 with a diagnosis of RIGHT SHOULDER OA.  They were brought to the operating room on 06/22/2016 and underwent Procedure(s): RIGHT TOTAL SHOULDER ARTHROPLASTY.    They were given perioperative antibiotics: Anti-infectives    Start     Dose/Rate Route Frequency Ordered Stop   06/22/16 1400  ceFAZolin (ANCEF) IVPB 2g/100 mL premix     2 g 200 mL/hr over 30 Minutes Intravenous Every 6 hours 06/22/16 1119 06/23/16 0339   06/22/16 0600  ceFAZolin (ANCEF) 3 g in dextrose 5 % 50 mL IVPB     3 g 130 mL/hr over 30 Minutes Intravenous On call to O.R. 06/21/16 1341 06/22/16 0750    .  Patient underwent the above named procedure and tolerated it well. The following day they were hemodynamically stable and pain was controlled on oral analgesics. They were neurovascularly intact to the operative extremity. OT was ordered and worked with patient per protocol. They were medically and orthopaedically stable for discharge on day 1.    DIAGNOSTIC STUDIES:  RECENT RADIOGRAPHIC STUDIES :  No results found.  RECENT VITAL SIGNS:  Patient Vitals for the past 24  hrs:  BP Temp Temp src Pulse Resp SpO2  06/23/16 0545 128/60 97.6 F (36.4 C) Oral 71 16 97 %  06/23/16 0031 121/66 98.1 F (36.7 C) Oral 77 16 97 %  06/22/16 2013 118/70 97.9 F (36.6 C) Oral 87 16 95 %  06/22/16 1500 118/70 97.1 F (36.2 C) Oral 72 18 98 %  06/22/16 1125 128/63 97.6 F (36.4 C) Oral 64 18 97 %  06/22/16 1055 121/74 97.6 F (36.4 C) - 66 13 100 %  06/22/16 1040 122/74 - - 66 13 96 %  06/22/16 1025 126/78 - - 67 12 99 %  06/22/16 1010 124/74 - - 70 13 98 %  06/22/16 0955 (!) 143/68 - - 69 12 94 %  06/22/16 0942 132/70 97.6 F (36.4 C) - 70 17 97 %  .  RECENT EKG RESULTS:    Orders placed or performed during the hospital encounter of 06/09/16  . EKG 12 lead  . EKG 12 lead    DISCHARGE INSTRUCTIONS:  Discharge Instructions    Discontinue IV    Complete by:  As directed      DISCHARGE MEDICATIONS:     Medication List    TAKE these medications   acetaminophen 650 MG CR tablet Commonly known  as:  TYLENOL Take 650 mg by mouth daily.   aspirin 81 MG tablet Take 81 mg by mouth daily.   diazepam 5 MG tablet Commonly known as:  VALIUM Take 0.5-1 tablets (2.5-5 mg total) by mouth every 6 (six) hours as needed for muscle spasms or sedation.   fish oil-omega-3 fatty acids 1000 MG capsule Take 1 g by mouth daily.   hydrochlorothiazide 25 MG tablet Commonly known as:  HYDRODIURIL Take 1 tablet (25 mg total) by mouth daily.   losartan 100 MG tablet Commonly known as:  COZAAR Take 1 tablet (100 mg total) by mouth daily.   omeprazole 20 MG capsule Commonly known as:  PRILOSEC Take 1 capsule (20 mg total) by mouth daily.   ondansetron 4 MG tablet Commonly known as:  ZOFRAN Take 1 tablet (4 mg total) by mouth every 8 (eight) hours as needed for nausea or vomiting.   oxyCODONE-acetaminophen 5-325 MG tablet Commonly known as:  PERCOCET Take 1-2 tablets by mouth every 4 (four) hours as needed.   potassium chloride 10 MEQ tablet Commonly known as:   KLOR-CON M10 Take 1 tablet (10 mEq total) by mouth daily.   simvastatin 40 MG tablet Commonly known as:  ZOCOR Take 40 mg by mouth daily.       FOLLOW UP VISIT:   Follow-up Information    Metta Clines SUPPLE, MD.   Specialty:  Orthopedic Surgery Why:  call to be seen in 10-14 days Contact information: [address] [place] [address] [id number]           DISCHARGE TO: Home  DISPOSITION: Good  DISCHARGE CONDITION:  [name] [name] for Dr. [name] 06/23/2016, 7:57 AM

## 2016-06-23 NOTE — Progress Notes (Signed)
Occupational Therapy Evaluation Patient Details Name: [name] [name] MRN: [id number] DOB: May 20, 1941 Today's Date: 06/23/2016    History of Present Illness s/p R TSA   Clinical Impression   Completed all education regarding compensatory techniques for ADL and HEP for RUE. Pt able to demonstrate understanding. Pt to follow up with Dr. Onnie Graham to progress rehab of RUE. If wife has questions regarding information, please page number below. Pt ready to D/C when medically stable.     Follow Up Recommendations  Other (comment) (progress rehab of shoulder as indicated by MD)    Equipment Recommendations  None recommended by OT    Recommendations for Other Services       Precautions / Restrictions Precautions Precautions: Shoulder Type of Shoulder Precautions: passive protocol. FF 0-90; Abd 0-60; ER 0-30; pendulums OK; AROM elbow/wirst/hand Shoulder Interventions: Shoulder sling/immobilizer (for mobility and sleep) Precaution Booklet Issued: Yes (comment) Restrictions Weight Bearing Restrictions: Yes RUE Weight Bearing: Non weight bearing      Mobility Bed Mobility Overal bed mobility: Modified Independent                Transfers Overall transfer level: Independent                    Balance Overall balance assessment: No apparent balance deficits (not formally assessed)                                          ADL Overall ADL's : Needs assistance/impaired                                     Functional mobility during ADLs: Independent General ADL Comments: Completed education regarding compensatory techniques for ADL, sling management and positioning of RUE; edema control. See exercise below     Vision     Perception     Praxis      Pertinent Vitals/Pain Pain Assessment: 0-10 Pain Score: 6  Pain Location: Rshoulder Pain Descriptors / Indicators: Aching;Discomfort;Sore Pain Intervention(s): Limited activity within  patient's tolerance;Repositioned;Ice applied;Premedicated before session     Hand Dominance Right   Extremity/Trunk Assessment Upper Extremity Assessment Upper Extremity Assessment: RUE deficits/detail RUE Deficits / Details: elbow/wrist/hand WFL; able to tolerate PROM FF 0-60; Abd 0-30; ER to neutral. RUE Coordination: decreased gross motor   Lower Extremity Assessment Lower Extremity Assessment: Overall WFL for tasks assessed   Cervical / Trunk Assessment Cervical / Trunk Assessment: Normal   Communication Communication Communication: No difficulties   Cognition Arousal/Alertness: Awake/alert Behavior During Therapy: WFL for tasks assessed/performed Overall Cognitive Status: Within Functional Limits for tasks assessed                     General Comments       Exercises Exercises: Shoulder     Shoulder Instructions Shoulder Instructions Donning/doffing shirt without moving shoulder: Supervision/safety;Patient able to independently direct caregiver Method for sponge bathing under operated UE: Supervision/safety;Patient able to independently direct caregiver Donning/doffing sling/immobilizer: Supervision/safety;Patient able to independently direct caregiver Correct positioning of sling/immobilizer: Supervision/safety Pendulum exercises (written home exercise program):  (dangle only) ROM for elbow, wrist and digits of operated UE: Modified independent Sling wearing schedule (on at all times/off for ADL's): Modified independent Proper positioning of operated UE when showering: Modified independent Positioning of UE while sleeping: Supervision/safety  Home Living Family/patient expects to be discharged to:: Private residence Living Arrangements: Spouse/significant other Available Help at Discharge: Available 24 hours/day Type of Home: House Home Access: Stairs to enter [employer] of Steps: 2   Home Layout: One level     Bathroom Shower/Tub:  Occupational psychologist: Standard Bathroom Accessibility: Yes How Accessible: Accessible via walker Home Equipment: [place] - 2 wheels;Shower seat          Prior Functioning/Environment Level of Independence: Independent        Comments: very actuve    OT Diagnosis: Generalized weakness;Acute pain   OT Problem List: Decreased strength;Decreased range of motion;Decreased knowledge of precautions;Obesity;Impaired UE functional use;Pain;Increased edema   OT Treatment/Interventions:      OT Goals(Current goals can be found in the care plan section) Acute Rehab OT Goals Patient Stated Goal: to regain the use of his arm OT Goal Formulation: All assessment and education complete, DC therapy  OT Frequency:     Barriers to D/C:            Co-evaluation              End of Session Nurse Communication: Mobility status;Other (comment)  Activity Tolerance: Patient tolerated treatment well Patient left: in chair;with call bell/phone within reach   Time: 0820-0902 OT Time Calculation (min): 42 min Charges:  OT General Charges $OT Visit: 1 Procedure OT Evaluation $OT Eval Moderate Complexity: 1 Procedure OT Treatments $Self Care/Home Management : 8-22 mins $Therapeutic Exercise: 8-22 mins G-Codes:    Ani Deoliveira,HILLARY July 22, 2016, 9:15 AM   Maurie Boettcher, OTR/L  [phone] 22-Jul-2016

## 2016-06-28 ENCOUNTER — Encounter (HOSPITAL_COMMUNITY): Payer: Self-pay | Admitting: Orthopedic Surgery

## 2016-07-03 ENCOUNTER — Encounter (HOSPITAL_COMMUNITY): Payer: Self-pay | Admitting: Orthopedic Surgery

## 2016-07-05 DIAGNOSIS — Z471 Aftercare following joint replacement surgery: Secondary | ICD-10-CM | POA: Diagnosis not present

## 2016-07-17 DIAGNOSIS — M19011 Primary osteoarthritis, right shoulder: Secondary | ICD-10-CM | POA: Diagnosis not present

## 2016-07-24 DIAGNOSIS — M19011 Primary osteoarthritis, right shoulder: Secondary | ICD-10-CM | POA: Diagnosis not present

## 2016-08-02 DIAGNOSIS — M19011 Primary osteoarthritis, right shoulder: Secondary | ICD-10-CM | POA: Diagnosis not present

## 2016-08-07 DIAGNOSIS — M19011 Primary osteoarthritis, right shoulder: Secondary | ICD-10-CM | POA: Diagnosis not present

## 2016-08-10 DIAGNOSIS — M19011 Primary osteoarthritis, right shoulder: Secondary | ICD-10-CM | POA: Diagnosis not present

## 2016-08-14 DIAGNOSIS — M19011 Primary osteoarthritis, right shoulder: Secondary | ICD-10-CM | POA: Diagnosis not present

## 2016-08-16 DIAGNOSIS — M19011 Primary osteoarthritis, right shoulder: Secondary | ICD-10-CM | POA: Diagnosis not present

## 2016-08-23 DIAGNOSIS — M19011 Primary osteoarthritis, right shoulder: Secondary | ICD-10-CM | POA: Diagnosis not present

## 2016-08-29 DIAGNOSIS — M19011 Primary osteoarthritis, right shoulder: Secondary | ICD-10-CM | POA: Diagnosis not present

## 2016-08-30 DIAGNOSIS — M19011 Primary osteoarthritis, right shoulder: Secondary | ICD-10-CM | POA: Diagnosis not present

## 2016-08-30 DIAGNOSIS — Z471 Aftercare following joint replacement surgery: Secondary | ICD-10-CM | POA: Diagnosis not present

## 2016-09-01 DIAGNOSIS — M19011 Primary osteoarthritis, right shoulder: Secondary | ICD-10-CM | POA: Diagnosis not present

## 2016-10-02 ENCOUNTER — Other Ambulatory Visit (INDEPENDENT_AMBULATORY_CARE_PROVIDER_SITE_OTHER): Payer: Medicare Other

## 2016-10-02 ENCOUNTER — Telehealth: Payer: Self-pay | Admitting: Internal Medicine

## 2016-10-02 ENCOUNTER — Ambulatory Visit (INDEPENDENT_AMBULATORY_CARE_PROVIDER_SITE_OTHER): Payer: Medicare Other | Admitting: Internal Medicine

## 2016-10-02 ENCOUNTER — Encounter: Payer: Self-pay | Admitting: Internal Medicine

## 2016-10-02 VITALS — BP 126/68 | HR 69 | Temp 97.4°F | Resp 14 | Ht 72.0 in | Wt 306.2 lb

## 2016-10-02 DIAGNOSIS — Z0000 Encounter for general adult medical examination without abnormal findings: Secondary | ICD-10-CM

## 2016-10-02 DIAGNOSIS — E785 Hyperlipidemia, unspecified: Secondary | ICD-10-CM

## 2016-10-02 DIAGNOSIS — K219 Gastro-esophageal reflux disease without esophagitis: Secondary | ICD-10-CM | POA: Diagnosis not present

## 2016-10-02 DIAGNOSIS — R739 Hyperglycemia, unspecified: Secondary | ICD-10-CM

## 2016-10-02 DIAGNOSIS — I10 Essential (primary) hypertension: Secondary | ICD-10-CM | POA: Diagnosis not present

## 2016-10-02 LAB — HEMOGLOBIN A1C: Hgb A1c MFr Bld: 6.6 % — ABNORMAL HIGH (ref 4.6–6.5)

## 2016-10-02 LAB — BASIC METABOLIC PANEL
BUN: 13 mg/dL (ref 6–23)
CALCIUM: 9.2 mg/dL (ref 8.4–10.5)
CO2: 27 meq/L (ref 19–32)
Chloride: 99 mEq/L (ref 96–112)
Creatinine, Ser: 1.04 mg/dL (ref 0.40–1.50)
GFR: 74.15 mL/min (ref >60.00)
GLUCOSE: 112 mg/dL — AB (ref 70–99)
POTASSIUM: 3.8 meq/L (ref 3.5–5.1)
SODIUM: 137 meq/L (ref 135–145)

## 2016-10-02 LAB — LIPID PANEL
CHOL/HDL RATIO: 4
CHOLESTEROL: 172 mg/dL (ref 0–200)
HDL: 40.6 mg/dL (ref >39.00)
NonHDL: 131.87
Triglycerides: 305 mg/dL — ABNORMAL HIGH (ref 0.0–149.0)
VLDL: 61 mg/dL — ABNORMAL HIGH (ref 0.0–40.0)

## 2016-10-02 LAB — ALT: ALT: 19 U/L (ref 0–53)

## 2016-10-02 LAB — AST: AST: 15 U/L (ref 0–37)

## 2016-10-02 LAB — LDL CHOLESTEROL, DIRECT: LDL DIRECT: 97 mg/dL

## 2016-10-02 LAB — PSA: PSA: 1.59 ng/mL (ref 0.10–4.00)

## 2016-10-02 MED ORDER — OMEPRAZOLE 20 MG PO CPDR: 20.0000 mg | DELAYED_RELEASE_CAPSULE | Freq: Every day | ORAL | 3 refills | Status: DC

## 2016-10-02 NOTE — Progress Notes (Signed)
Subjective:    Patient ID: [name] [name], male    DOB: Mar 14, 1941, 75 y.o.   MRN: [id number]  DOS:  10/02/2016 Type of visit - description : cpx Interval history: Had shoulder surgery, recovering well. Prediabetes: Has gained weight, plans to "do better". GERD: Needs a refill of medications. HTN: Good med compliance, normal ambulatory BPs    Review of Systems Denies any new or worrisome symptoms Constitutional: No fever. No chills. No unexplained wt changes. No unusual sweats  HEENT: No dental problems, no ear discharge, no facial swelling, no voice changes. No eye discharge, no eye  redness , no  intolerance to light   Respiratory: No wheezing , no  difficulty breathing. No cough , no mucus production  Cardiovascular: No CP, no leg swelling , no  Palpitations  GI: no nausea, no vomiting, no diarrhea , no  abdominal pain.  No blood in the stools. No dysphagia, no odynophagia    Endocrine: No polyphagia, no polyuria , no polydipsia  GU: No dysuria, gross hematuria, difficulty urinating. No urinary urgency, no frequency.  Musculoskeletal: Recovering well from shoulder surgery  Skin: No change in the color of the skin, palor , no  Rash  Allergic, immunologic: No environmental allergies , no  food allergies  Neurological: No dizziness no  syncope. No headaches. No diplopia, no slurred, no slurred speech, no motor deficits, no facial  Numbness  Hematological: No enlarged lymph nodes, no easy bruising , no unusual bleedings  Psychiatry: No suicidal ideas, no hallucinations, no beavior problems, no confusion.  No unusual/severe anxiety, no depression    Past Medical History:  Diagnosis Date  . Arthritis   . Complication of anesthesia     had spinal with knee surgeries  -"hr too low" for general  . Dysrhythmia   . GERD (gastroesophageal reflux disease)    hx no problems now  . History of hiatal hernia    ?20 yrs ago  . Hyperopia 2016  . Hypertension   . MG, ocular  (myasthenia gravis) ([place])    ? of   . Prediabetes   . Presbyopia OU 2016    Past Surgical History:  Procedure Laterality Date  . HERNIA REPAIR     bilateral inguinal hernia  . HERNIA REPAIR     umbilical  . JOINT REPLACEMENT  2009   right knee  . MIDDLE EAR SURGERY Right    ear -patched hole in ear drum  . TOTAL KNEE ARTHROPLASTY  09/10/2012   Procedure: TOTAL KNEE ARTHROPLASTY;  Surgeon: Tobi Bastos, MD;  Location: [facility];  Service: Orthopedics;  Laterality: Left;  . TOTAL SHOULDER ARTHROPLASTY Right 06/22/2016   Procedure: RIGHT TOTAL SHOULDER ARTHROPLASTY;  Surgeon: [name], MD;  Location: [facility];  Service: Orthopedics;  Laterality: Right;    Social History   Social History  . Marital status: Married    Spouse name: N/A  . Number of children: 4  . Years of education: N/A   Occupational History  . [employer]-- retired 2010    Social History Main Topics  . Smoking status: Former Smoker    Packs/day: 0.50    Years: 2.00    Types: Cigarettes    Quit date: 06/09/1966  . Smokeless tobacco: Never Used  . Alcohol use No     Comment: none  . Drug use: No  . Sexual activity: Yes    Partners: Female   Other Topics Concern  . Not on file   Social  History Narrative   married, lives w/ second wife, 2 living children, lost 2 kids       Family History  Problem Relation Age of Onset  . Diabetes Brother   . Colon cancer Neg Hx   . Prostate cancer Neg Hx   . Stroke Neg Hx   . CAD Neg Hx    Family History  Problem Relation Age of Onset  . Diabetes Brother   . Colon cancer Neg Hx   . Prostate cancer Neg Hx   . Stroke Neg Hx   . CAD Neg Hx        Medication List       Accurate as of 10/02/16 11:59 PM. Always use your most recent med list.          acetaminophen 650 MG CR tablet Commonly known as:  TYLENOL Take 650 mg by mouth daily.   aspirin 81 MG tablet Take 81 mg by mouth daily.   Fish Oil 1200 MG Caps Take 1,200 mg by mouth daily.    hydrochlorothiazide 25 MG tablet Commonly known as:  HYDRODIURIL Take 1 tablet (25 mg total) by mouth daily.   losartan 100 MG tablet Commonly known as:  COZAAR Take 1 tablet (100 mg total) by mouth daily.   omeprazole 20 MG capsule Commonly known as:  PRILOSEC Take 1 capsule (20 mg total) by mouth daily.   potassium chloride 10 MEQ tablet Commonly known as:  KLOR-CON M10 Take 1 tablet (10 mEq total) by mouth daily.   simvastatin 40 MG tablet Commonly known as:  ZOCOR Take 40 mg by mouth daily.          Objective:   Physical Exam BP 126/68 (BP Location: Left Arm, Patient Position: Sitting, Cuff Size: Normal)   Pulse 69   Temp 97.4 F (36.3 C) (Oral)   Resp 14   Ht 6' (1.829 m)   Wt (!) 306 lb 4 oz (138.9 kg)   SpO2 96%   BMI 41.53 kg/m   General:   Well developed, well nourished . NAD.  Neck: No  thyromegaly  HEENT:  Normocephalic . Face symmetric, atraumatic Lungs:  CTA B Normal respiratory effort, no intercostal retractions, no accessory muscle use. Heart: RRR,  no murmur.  No pretibial edema bilaterally  Abdomen:  Not distended, soft, non-tender. No rebound or rigidity.   Skin: Multiple skin lesions, muscle think consistent with SK. DRE: wnl Neurologic:  alert & oriented X3.  Speech normal, gait appropriate for age and unassisted Strength symmetric and appropriate for age.  Psych: Cognition and judgment appear intact.  Cooperative with normal attention span and concentration.  Behavior appropriate. No anxious or depressed appearing.    Assessment & Plan:   Assessment Prediabetes HTN Hyperlipidemia Palpitations, PVCs, h/o bradycardia Myasthenia gravis Morbid obesity  PLAN: Prediabetes: Last A1c is stable, has gained some weight, plans to go back to a healthier diet HTN: Cont w/  potassium, losartan HCTZ. Checking labs. Ambulatory BPs normal Hyperlipidemia:  on simvastatin, check labs. S/p  shoulder replacement: Recovering well SK:  Multiple lesions, clinically SKs, recommend to see derm for any change in lesions. He sees dermatology q year RTC 6 months

## 2016-10-02 NOTE — Progress Notes (Signed)
Pre visit review using our clinic review tool, if applicable. No additional management support is needed unless otherwise documented below in the visit note. 

## 2016-10-02 NOTE — Telephone Encounter (Signed)
06/23/15 PR PPPS, SUBSEQ VISIT [id number] patient scheduled medicare wellness for 04/02/16 with Nurse

## 2016-10-02 NOTE — Patient Instructions (Signed)
GO TO THE LAB : Get the blood work     GO TO THE FRONT DESK Schedule your next appointment for a   routine checkup in 6 months  Please schedule him Medicare wellness exam with one of our registered  nurses.

## 2016-10-02 NOTE — Assessment & Plan Note (Addendum)
Td 09; pneumonia shot--2010;  Prevnar 2015; Had a  zostavax 2012 per pt . 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

## 2016-10-02 NOTE — Assessment & Plan Note (Signed)
Prediabetes: Last A1c is stable, has gained some weight, plans to go back to a healthier diet HTN: Cont w/  potassium, losartan HCTZ. Checking labs. Ambulatory BPs normal Hyperlipidemia:  on simvastatin, check labs. S/p  shoulder replacement: Recovering well SK: Multiple lesions, clinically SKs, recommend to see derm for any change in lesions. He sees dermatology q year RTC 6 months

## 2016-11-29 ENCOUNTER — Other Ambulatory Visit: Payer: Self-pay | Admitting: Internal Medicine

## 2016-12-05 ENCOUNTER — Other Ambulatory Visit: Payer: Self-pay | Admitting: Internal Medicine

## 2016-12-06 ENCOUNTER — Encounter: Payer: Self-pay | Admitting: *Deleted

## 2016-12-06 ENCOUNTER — Ambulatory Visit (INDEPENDENT_AMBULATORY_CARE_PROVIDER_SITE_OTHER): Payer: Medicare Other | Admitting: *Deleted

## 2016-12-06 VITALS — BP 138/80 | HR 72 | Ht 71.5 in | Wt 309.0 lb

## 2016-12-06 DIAGNOSIS — Z0000 Encounter for general adult medical examination without abnormal findings: Secondary | ICD-10-CM

## 2016-12-06 NOTE — Progress Notes (Addendum)
Subjective:   [name] [name] is a 76 y.o. male who presents for Medicare Annual/Subsequent preventive examination.  Review of Systems:  No ROS.  Medicare Wellness Visit.  Cardiac Risk Factors include: advanced age (>81men, >67 women);dyslipidemia;obesity (BMI >30kg/m2);male gender;hypertension  Sleep patterns: no sleep issues, has snoring, feels rested on waking, gets up 1 times nightly to void and sleeps 5 hours nightly.   Home Safety/Smoke Alarms: Feels safe in home. Smoke alarms in place.   Living environment; residence and Firearm Safety: Lives w/ wife and dog. 1-story house/ trailer, firearms stored safely. Seat Belt Safety/Bike Helmet: Wears seat belt.   Counseling:   Eye Exam- Follows w/ eye doctor  yearly Dental- Does not follow w/ dentist regularly. Partial plate upper.  Male:   CCS- last 09/06/15. 3 polyps removed, otherwise normal. 3 year recall.      PSA-  Lab Results  Component Value Date   PSA 1.59 10/02/2016   PSA 2.57 02/02/2014   PSA 1.75 12/07/2011       Objective:    Vitals: BP (!) 154/76 (BP Location: Left Arm, Patient Position: Sitting, Cuff Size: Normal)   Pulse 72   Ht 5' 11.5" (1.816 m)   Wt (!) 309 lb (140.2 kg)   SpO2 98%   BMI 42.50 kg/m   Body mass index is 42.5 kg/m.   Wt Readings from Last 3 Encounters:  12/06/16 (!) 309 lb (140.2 kg)  10/02/16 (!) 306 lb 4 oz (138.9 kg)  06/22/16 299 lb (135.6 kg)   Tobacco History  Smoking Status  . Former Smoker  . Packs/day: 0.50  . Years: 2.00  . Types: Cigarettes  . Quit date: 06/09/1966  Smokeless Tobacco  . Never Used     Counseling given: Not Answered   Past Medical History:  Diagnosis Date  . Arthritis   . Complication of anesthesia     had spinal with knee surgeries  -"hr too low" for general  . Dysrhythmia   . GERD (gastroesophageal reflux disease)    hx no problems now  . History of hiatal hernia    ?20 yrs ago  . Hyperopia 2016  . Hypertension   . MG, ocular (myasthenia  gravis) ([place])    ? of   . Prediabetes   . Presbyopia OU 2016   Past Surgical History:  Procedure Laterality Date  . HERNIA REPAIR     bilateral inguinal hernia  . HERNIA REPAIR     umbilical  . JOINT REPLACEMENT  2009   right knee  . MIDDLE EAR SURGERY Right    ear -patched hole in ear drum  . TOTAL KNEE ARTHROPLASTY  09/10/2012   Procedure: TOTAL KNEE ARTHROPLASTY;  Surgeon: Tobi Bastos, MD;  Location: [facility];  Service: Orthopedics;  Laterality: Left;  . TOTAL SHOULDER ARTHROPLASTY Right 06/22/2016   Procedure: RIGHT TOTAL SHOULDER ARTHROPLASTY;  Surgeon: [name], MD;  Location: [facility];  Service: Orthopedics;  Laterality: Right;   Family History  Problem Relation Age of Onset  . Diabetes Brother   . Hypertension Sister   . Colon cancer Neg Hx   . Prostate cancer Neg Hx   . Stroke Neg Hx   . CAD Neg Hx    History  Sexual Activity  . Sexual activity: Yes  . Partners: Female    Outpatient Encounter Prescriptions as of 12/06/2016  Medication Sig  . acetaminophen (TYLENOL) 650 MG CR tablet Take 650 mg by mouth daily.   Marland Kitchen aspirin  81 MG tablet Take 81 mg by mouth daily.  . hydrochlorothiazide (HYDRODIURIL) 25 MG tablet Take 1 tablet (25 mg total) by mouth daily.  Marland Kitchen losartan (COZAAR) 100 MG tablet Take 1 tablet (100 mg total) by mouth daily.  . Omega-3 Fatty Acids (FISH OIL) 1200 MG CAPS Take 1,200 mg by mouth daily.  Marland Kitchen omeprazole (PRILOSEC) 20 MG capsule Take 1 capsule (20 mg total) by mouth daily.  . potassium chloride (KLOR-CON M10) 10 MEQ tablet Take 1 tablet (10 mEq total) by mouth daily.  . simvastatin (ZOCOR) 80 MG tablet Take 1 tablet (80 mg total) by mouth daily. (Patient taking differently: Take 40 mg by mouth daily. )   No facility-administered encounter medications on file as of 12/06/2016.     Activities of Daily Living In your present state of health, do you have any difficulty performing the following activities: 12/06/2016 06/09/2016  Hearing? N N    Vision? N N  Difficulty concentrating or making decisions? N N  Walking or climbing stairs? N N  Dressing or bathing? N N  Doing errands, shopping? N N  Preparing Food and eating ? N -  Some recent data might be hidden    Patient Care Team: Colon Branch, MD as PCP - General Thayer Headings, MD as Consulting Physician (Cardiology) [name], MD as Consulting Physician (Gastroenterology) [name], MD as Consulting Physician (Orthopedic Surgery)   Assessment:    Physical assessment deferred to PCP.  Exercise Activities and Dietary recommendations Current Exercise Habits: Home exercise routine, Type of exercise: Other - see comments;strength training/weights (exercise bike), Time (Minutes): 20, Frequency (Times/Week): 2, Weekly Exercise (Minutes/Week): 40  Diet (meal preparation, eat out, water intake, caffeinated beverages, dairy products, fruits and vegetables): on average, 2 meals per day. Drinks lots of sweet tea. Drinks "about a 1/2 gallon" of water per day. Decaf coffee in the morning. Pt feels diet is somewhere between healthy and unhealthy.   Breakfast: bacon, eggs, grits Lunch: skips Dinner: green beans, potatoes, cabbage, and meat  Goals    . Increase physical activity    . Reduce portion size    . Weight (lb) < 300 lb (136.1 kg)      Fall Risk Fall Risk  12/06/2016 05/26/2016 12/21/2015 10/13/2015 06/23/2015  Falls in the past year? Yes Yes No No No  Number falls in past yr: 1 1 - - -  Injury with Fall? No Yes - - -  Follow up Falls prevention discussed Falls evaluation completed;Falls prevention discussed - - -   Depression Screen PHQ 2/9 Scores 12/06/2016 05/26/2016 12/21/2015 06/23/2015  PHQ - 2 Score 0 0 0 0    Cognitive Function MMSE - Mini Mental State Exam 12/06/2016  Orientation to time 5  Orientation to Place 5  Registration 3  Attention/ Calculation 2  Recall 3  Language- name 2 objects 2  Language- repeat 1  Language- follow 3 step command 3   Language- read & follow direction 1  Write a sentence 1  Copy design 1  Total score 27        Immunization History  Administered Date(s) Administered  . H1N1 11/12/2008  . Influenza Split 07/17/2014  . Influenza Whole 08/04/2010  . Influenza,inj,Quad PF,36+ Mos 06/23/2015  . Influenza-Unspecified 07/23/2013, 07/07/2016  . Pneumococcal Conjugate-13 08/12/2014  . Pneumococcal Polysaccharide-23 11/12/2008  . Td 12/19/2007  . Zoster 10/24/2011   Screening Tests Health Maintenance  Topic Date Due  . TETANUS/TDAP  12/18/2017  . COLONOSCOPY  09/05/2018  . INFLUENZA VACCINE  Completed  . ZOSTAVAX  Completed  . PNA vac Low Risk Adult  Completed      Plan:   Follow-up w/ PCP as scheduled.  Eat heart healthy diet (full of fruits, vegetables, whole grains, lean protein, water--limit salt, fat, and sugar intake) and increase physical activity as tolerated.  Dental resource list provided.  During the course of the visit the patient was educated and counseled about the following appropriate screening and preventive services:   Vaccines to include Pneumoccal, Influenza, Hepatitis B, Td, Zostavax, HCV  Cardiovascular Disease  Colorectal cancer screening  Diabetes screening  Prostate Cancer Screening  Glaucoma screening  Nutrition counseling   Patient Instructions (the written plan) was given to the patient.    [name], RN  12/06/2016  [name], MD

## 2016-12-06 NOTE — Progress Notes (Signed)
Pre visit review using our clinic review tool, if applicable. No additional management support is needed unless otherwise documented below in the visit note. 

## 2016-12-06 NOTE — Patient Instructions (Addendum)
[name] [name] , Thank you for taking time to come for your Medicare Wellness Visit. I appreciate your ongoing commitment to your health goals. Please review the following plan we discussed and let me know if I can assist you in the future.   Schedule your follow-up appt w/ Dr. Acie Fredrickson and your eye exam with your eye doctor. Bring a copy of your advance directives to your next office visit.  [employer] is a Lawyer for seniors through [employer]. The nearest [place] location for you is:  [employer] Address: [address] [place], [place] [address] Phone: [phone]  These are the goals we discussed: Goals    . Increase physical activity    . Reduce portion size    . Weight (lb) < 300 lb (136.1 kg)       This is a list of the screening recommended for you and due dates:  Health Maintenance  Topic Date Due  . Tetanus Vaccine  12/18/2017  . Colon Cancer Screening  09/05/2018  . Flu Shot  Completed  . Shingles Vaccine  Completed  . Pneumonia vaccines  Completed

## 2016-12-09 DIAGNOSIS — Z96611 Presence of right artificial shoulder joint: Secondary | ICD-10-CM | POA: Diagnosis not present

## 2016-12-09 DIAGNOSIS — Z471 Aftercare following joint replacement surgery: Secondary | ICD-10-CM | POA: Diagnosis not present

## 2016-12-10 ENCOUNTER — Other Ambulatory Visit: Payer: Self-pay | Admitting: Internal Medicine

## 2017-01-25 DIAGNOSIS — L570 Actinic keratosis: Secondary | ICD-10-CM | POA: Diagnosis not present

## 2017-01-25 DIAGNOSIS — L821 Other seborrheic keratosis: Secondary | ICD-10-CM | POA: Diagnosis not present

## 2017-01-25 DIAGNOSIS — L578 Other skin changes due to chronic exposure to nonionizing radiation: Secondary | ICD-10-CM | POA: Diagnosis not present

## 2017-02-15 DIAGNOSIS — M545 Low back pain: Secondary | ICD-10-CM | POA: Diagnosis not present

## 2017-02-15 DIAGNOSIS — G8929 Other chronic pain: Secondary | ICD-10-CM | POA: Diagnosis not present

## 2017-02-28 DIAGNOSIS — M5136 Other intervertebral disc degeneration, lumbar region: Secondary | ICD-10-CM | POA: Diagnosis not present

## 2017-03-06 DIAGNOSIS — M48062 Spinal stenosis, lumbar region with neurogenic claudication: Secondary | ICD-10-CM | POA: Diagnosis not present

## 2017-03-06 DIAGNOSIS — M5136 Other intervertebral disc degeneration, lumbar region: Secondary | ICD-10-CM | POA: Diagnosis not present

## 2017-03-07 ENCOUNTER — Other Ambulatory Visit: Payer: Self-pay | Admitting: Internal Medicine

## 2017-03-28 DIAGNOSIS — M48062 Spinal stenosis, lumbar region with neurogenic claudication: Secondary | ICD-10-CM | POA: Diagnosis not present

## 2017-04-02 ENCOUNTER — Ambulatory Visit (INDEPENDENT_AMBULATORY_CARE_PROVIDER_SITE_OTHER): Payer: Medicare Other | Admitting: Internal Medicine

## 2017-04-02 ENCOUNTER — Ambulatory Visit: Payer: Medicare Other | Admitting: *Deleted

## 2017-04-02 VITALS — BP 136/76 | HR 52 | Temp 97.6°F | Resp 14 | Ht 72.0 in | Wt 309.5 lb

## 2017-04-02 DIAGNOSIS — E785 Hyperlipidemia, unspecified: Secondary | ICD-10-CM

## 2017-04-02 DIAGNOSIS — R739 Hyperglycemia, unspecified: Secondary | ICD-10-CM | POA: Diagnosis not present

## 2017-04-02 DIAGNOSIS — I10 Essential (primary) hypertension: Secondary | ICD-10-CM | POA: Diagnosis not present

## 2017-04-02 LAB — BASIC METABOLIC PANEL WITH GFR
BUN: 22 mg/dL (ref 6–23)
CO2: 27 meq/L (ref 19–32)
Calcium: 9.3 mg/dL (ref 8.4–10.5)
Chloride: 99 meq/L (ref 96–112)
Creatinine, Ser: 1.16 mg/dL (ref 0.40–1.50)
GFR: 65.29 mL/min
Glucose, Bld: 118 mg/dL — ABNORMAL HIGH (ref 70–99)
Potassium: 4.3 meq/L (ref 3.5–5.1)
Sodium: 136 meq/L (ref 135–145)

## 2017-04-02 LAB — HEMOGLOBIN A1C: Hgb A1c MFr Bld: 7 % — ABNORMAL HIGH (ref 4.6–6.5)

## 2017-04-02 LAB — TSH: TSH: 0.78 u[IU]/mL (ref 0.35–4.50)

## 2017-04-02 NOTE — Progress Notes (Signed)
Pre visit review using our clinic review tool, if applicable. No additional management support is needed unless otherwise documented below in the visit note. 

## 2017-04-02 NOTE — Progress Notes (Signed)
Subjective:    Patient ID: [name] [name], male    DOB: [birth year], 76 y.o.   MRN: [id number]  DOS:  04/02/2017 Type of visit - description : ROV Interval history: Since the last visit, his taking the medications regularly. About 4 months ago developed back pain, left-sided, increased by walking, no nocturnal pain. Some radiation to the left leg. Went to see Dr. Rolena Infante, MRI was done, was told he has a back problem and he could proceed with surgery if so desired. Patient is quite reluctant to do so, had the first local injection few days ago with no relief so far.   Review of Systems   Past Medical History:  Diagnosis Date  . Arthritis   . Complication of anesthesia     had spinal with knee surgeries  -"hr too low" for general  . Dysrhythmia   . GERD (gastroesophageal reflux disease)    hx no problems now  . History of hiatal hernia    ?20 yrs ago  . Hyperopia 2016  . Hypertension   . Lumbar stenosis with neurogenic claudication   . MG, ocular (myasthenia gravis) ([place])    ? of   . Prediabetes   . Presbyopia OU 2016    Past Surgical History:  Procedure Laterality Date  . HERNIA REPAIR     bilateral inguinal hernia  . HERNIA REPAIR     umbilical  . JOINT REPLACEMENT  2009   right knee  . MIDDLE EAR SURGERY Right    ear -patched hole in ear drum  . TOTAL KNEE ARTHROPLASTY  09/10/2012   Procedure: TOTAL KNEE ARTHROPLASTY;  Surgeon: Tobi Bastos, MD;  Location: [facility];  Service: Orthopedics;  Laterality: Left;  . TOTAL SHOULDER ARTHROPLASTY Right 06/22/2016   Procedure: RIGHT TOTAL SHOULDER ARTHROPLASTY;  Surgeon: [name], MD;  Location: [facility];  Service: Orthopedics;  Laterality: Right;    Social History   Social History  . Marital status: Married    Spouse name: N/A  . Number of children: 4  . Years of education: N/A   Occupational History  . [employer]-- retired 2010    Social History Main Topics  . Smoking status: Former Smoker    Packs/day:  0.50    Years: 2.00    Types: Cigarettes    Quit date: 06/09/1966  . Smokeless tobacco: Never Used  . Alcohol use No     Comment: none  . Drug use: No  . Sexual activity: Yes    Partners: Female   Other Topics Concern  . Not on file   Social History Narrative   married, lives w/ second wife, 2 living children, lost 2 kids        Allergies as of 04/02/2017   No Known Allergies     Medication List       Accurate as of 04/02/17 10:01 PM. Always use your most recent med list.          acetaminophen 650 MG CR tablet Commonly known as:  TYLENOL Take 650 mg by mouth daily.   aspirin 81 MG tablet Take 81 mg by mouth daily.   Fish Oil 1200 MG Caps Take 1,200 mg by mouth daily.   hydrochlorothiazide 25 MG tablet Commonly known as:  HYDRODIURIL Take 1 tablet (25 mg total) by mouth daily.   KLOR-CON M10 10 MEQ tablet Generic drug:  potassium chloride TAKE 1 TABLET EVERY DAY   losartan 100 MG tablet Commonly known as:  COZAAR Take 1 tablet (100 mg total) by mouth daily.   Magnesium 100 MG Caps Take 100 mg by mouth daily.   omeprazole 20 MG capsule Commonly known as:  PRILOSEC Take 1 capsule (20 mg total) by mouth daily.   simvastatin 80 MG tablet Commonly known as:  ZOCOR Take 0.5 tablets (40 mg total) by mouth daily.          Objective:   Physical Exam BP 136/76 (BP Location: Left Arm, Patient Position: Sitting, Cuff Size: Normal)   Pulse (!) 52   Temp 97.6 F (36.4 C) (Oral)   Resp 14   Ht 6' (1.829 m)   Wt (!) 309 lb 8 oz (140.4 kg)   SpO2 97%   BMI 41.98 kg/m  General:   Well developed, well nourished . NAD.  HEENT:  Normocephalic . Face symmetric, atraumatic Lungs:  CTA B Normal respiratory effort, no intercostal retractions, no accessory muscle use. Heart: RRR,  no murmur.  No pretibial edema bilaterally  Skin: Not pale. Not jaundice Neurologic:  alert & oriented X3.  Speech normal, gait appropriate for age and unassisted. Gait is not  antalgic Psych--  Cognition and judgment appear intact.  Cooperative with normal attention span and concentration.  Behavior appropriate. No anxious or depressed appearing.      Assessment & Plan:   Assessment Prediabetes HTN Hyperlipidemia Palpitations, PVCs, h/o bradycardia Myasthenia gravis Morbid obesity  PLAN: Prediabetes: Last A1c increased, recheck. Unable to exercise much due to back pain,  try a stationary bike or water aerobics ?Marland Kitchen Also watch even more carefully his diet HTN: On losartan, HCTZ, potassium. Checking a BMP and TSH High cholesterol: Has been on simvastatin 80 mg half tablet daily for a while. Last FLP satisfactory. No change Back pain: For 3-4 months, some radicular features, already saw Dr. Rolena Infante, he is a surgical candidate but he is reluctant to proceed. He will try additional local injections, encouraged to take Tylenol and ibuprofen before he is physically active (preemptively)  RTC 6 months, CPX

## 2017-04-02 NOTE — Assessment & Plan Note (Signed)
Prediabetes: Last A1c increased, recheck. Unable to exercise much due to back pain,  try a stationary bike or water aerobics ?Marland Kitchen Also watch even more carefully his diet HTN: On losartan, HCTZ, potassium. Checking a BMP and TSH High cholesterol: Has been on simvastatin 80 mg half tablet daily for a while. Last FLP satisfactory. No change Back pain: For 3-4 months, some radicular features, already saw Dr. Rolena Infante, he is a surgical candidate but he is reluctant to proceed. He will try additional local injections, encouraged to take Tylenol and ibuprofen before he is physically active (preemptively)  RTC 6 months, CPX

## 2017-04-02 NOTE — Patient Instructions (Addendum)
GO TO THE LAB : Get the blood work     GO TO THE FRONT DESK Schedule your next appointment for a  physical exam in 6 months  Okay to take Tylenol or Motrin for pain. See instructions below.  Tylenol  500 mg OTC 2 tabs a day every 8 hours as needed for pain  IBUPROFEN (Advil or Motrin) 200 mg 2 tablets every twice a day as needed for pain.  Always take it with food because may cause gastritis and ulcers.  If you notice nausea, stomach pain, change in the color of stools --->  Stop the medicine and let us know

## 2017-04-05 MED ORDER — METFORMIN HCL 500 MG PO TABS
500.0000 mg | ORAL_TABLET | Freq: Two times a day (BID) | ORAL | 3 refills | Status: DC
Start: 2017-04-05 — End: 2017-07-25

## 2017-04-05 NOTE — Addendum Note (Signed)
Addended byDamita Dunnings D on: 04/05/2017 03:07 PM   Modules accepted: Orders

## 2017-04-10 ENCOUNTER — Telehealth: Payer: Self-pay | Admitting: Internal Medicine

## 2017-04-10 NOTE — Telephone Encounter (Signed)
Spoke with pt r/s awv. Pt stated that his awv has already been r/s to Dec 2018 on the same day as his CPE. Pt also stated that Dr. [name] wants him to return for a 3 month f/u visit due to his sugar being a little high. Scheduled f/u visit for 07/09/2017 at 11:15am. Also informed pt that he will receive a reminder call for his visit.

## 2017-05-07 ENCOUNTER — Telehealth: Payer: Self-pay | Admitting: Internal Medicine

## 2017-05-07 DIAGNOSIS — M5136 Other intervertebral disc degeneration, lumbar region: Secondary | ICD-10-CM | POA: Diagnosis not present

## 2017-05-07 DIAGNOSIS — M48062 Spinal stenosis, lumbar region with neurogenic claudication: Secondary | ICD-10-CM | POA: Diagnosis not present

## 2017-05-07 NOTE — Telephone Encounter (Signed)
Will bring paperwork to appt on 05/10/17 @ 10am

## 2017-05-07 NOTE — Telephone Encounter (Signed)
Caller name:Ms Cardin Relationship to patient: Can be reached:[phone] Pharmacy:  Reason for call:Dr Brooks([facility]) is requesting medical clearance for back surgery. Wife requesting call back to see if patient needs to be seen first or not. Last office visit 04/02/17

## 2017-05-07 NOTE — Telephone Encounter (Signed)
Last EKG 05/2016, will need surgical clearance appt. 30 mins. Okay to use same day slot. Pt will need Dr. Rolena Infante to send surgical clearance form prior to appt or bring with him to appt.

## 2017-05-10 ENCOUNTER — Encounter: Payer: Self-pay | Admitting: Internal Medicine

## 2017-05-10 ENCOUNTER — Ambulatory Visit (INDEPENDENT_AMBULATORY_CARE_PROVIDER_SITE_OTHER): Payer: Medicare Other | Admitting: Internal Medicine

## 2017-05-10 VITALS — BP 131/60 | HR 78 | Temp 98.5°F | Ht 72.0 in | Wt 312.2 lb

## 2017-05-10 DIAGNOSIS — E118 Type 2 diabetes mellitus with unspecified complications: Secondary | ICD-10-CM

## 2017-05-10 DIAGNOSIS — Z01818 Encounter for other preprocedural examination: Secondary | ICD-10-CM

## 2017-05-10 LAB — HEPATIC FUNCTION PANEL
ALK PHOS: 66 U/L (ref 39–117)
ALT: 15 U/L (ref 0–53)
AST: 12 U/L (ref 0–37)
Albumin: 4.1 g/dL (ref 3.5–5.2)
BILIRUBIN DIRECT: 0 mg/dL (ref 0.0–0.3)
BILIRUBIN TOTAL: 0.5 mg/dL (ref 0.2–1.2)
Total Protein: 6.5 g/dL (ref 6.0–8.3)

## 2017-05-10 NOTE — Assessment & Plan Note (Signed)
Surgical clearance:  76 year old gentleman with history of DM, HTN, hyperlipidemia in need of back surgery. He is doing well clinically, last year he tolerated a right total shoulder replacement surgery without reported problems. No history of heart disease, echocardiogram 2014 for palpitation showed a normal LV with some LVH. EKG today: Sinus rhythm, at baseline. Plan: We'll clear him for surgery, strongly recommend to discuss with anesthesia his diagnosis of myasthenia gravis. DM: Recently started metformin, check LFTs. Next visit 9- 2018

## 2017-05-10 NOTE — Progress Notes (Signed)
Pre visit review using our clinic review tool, if applicable. No additional management support is needed unless otherwise documented below in the visit note. 

## 2017-05-10 NOTE — Patient Instructions (Addendum)
GO TO THE LAB : Get the blood work      Please discuss the diagnosis of myasthenia gravis with the anesthesiologist.

## 2017-05-10 NOTE — Progress Notes (Signed)
Subjective:    Patient ID: [name] [name], male    DOB: 04-May-1941, 76 y.o.   MRN: [id number]  DOS:  05/10/2017 Type of visit - description : Surgical clearance  Interval history:  Patient has back pain, very difficult for him, "can hardly walk". He has recommended back surgery. Here for clearance. Despite the pain, he is able to do all his ADLs without problems including going upstairs  to a second floor. Before the pain was severe, he was able to exercise some without chest pain, difficulty breathing, DOE or palpitations. He tolerated well shoulder surgery last year.   Review of Systems  denies edema. Ambulatory BPs when checked normal. Started metformin, no apparent side effects, CBGs not checked.  Past Medical History:  Diagnosis Date  . Arthritis   . Complication of anesthesia     had spinal with knee surgeries  -"hr too low" for general  . Dysrhythmia   . GERD (gastroesophageal reflux disease)    hx no problems now  . History of hiatal hernia    ?20 yrs ago  . Hyperopia 2016  . Hypertension   . Lumbar stenosis with neurogenic claudication   . MG, ocular (myasthenia gravis) ([place])    ? of   . Prediabetes   . Presbyopia OU 2016    Past Surgical History:  Procedure Laterality Date  . HERNIA REPAIR     bilateral inguinal hernia  . HERNIA REPAIR     umbilical  . JOINT REPLACEMENT  2009   right knee  . MIDDLE EAR SURGERY Right    ear -patched hole in ear drum  . TOTAL KNEE ARTHROPLASTY  09/10/2012   Procedure: TOTAL KNEE ARTHROPLASTY;  Surgeon: Tobi Bastos, MD;  Location: [facility];  Service: Orthopedics;  Laterality: Left;  . TOTAL SHOULDER ARTHROPLASTY Right 06/22/2016   Procedure: RIGHT TOTAL SHOULDER ARTHROPLASTY;  Surgeon: [name], MD;  Location: [facility];  Service: Orthopedics;  Laterality: Right;    Social History   Social History  . Marital status: Married    Spouse name: N/A  . Number of children: 4  . Years of education: N/A   Occupational  History  . [employer]-- retired 2010    Social History Main Topics  . Smoking status: Former Smoker    Packs/day: 0.50    Years: 2.00    Types: Cigarettes    Quit date: 06/09/1966  . Smokeless tobacco: Never Used  . Alcohol use No     Comment: none  . Drug use: No  . Sexual activity: Yes    Partners: Female   Other Topics Concern  . Not on file   Social History Narrative   married, lives w/ second wife, 2 living children, lost 2 kids        Allergies as of 05/10/2017   No Known Allergies     Medication List       Accurate as of 05/10/17  6:00 PM. Always use your most recent med list.          acetaminophen 650 MG CR tablet Commonly known as:  TYLENOL Take 650 mg by mouth daily.   aspirin 81 MG tablet Take 81 mg by mouth daily.   Fish Oil 1200 MG Caps Take 1,200 mg by mouth daily.   hydrochlorothiazide 25 MG tablet Commonly known as:  HYDRODIURIL Take 1 tablet (25 mg total) by mouth daily.   KLOR-CON M10 10 MEQ tablet Generic drug:  potassium chloride TAKE 1  TABLET EVERY DAY   losartan 100 MG tablet Commonly known as:  COZAAR Take 1 tablet (100 mg total) by mouth daily.   Magnesium 100 MG Caps Take 100 mg by mouth daily.   metFORMIN 500 MG tablet Commonly known as:  GLUCOPHAGE Take 1 tablet (500 mg total) by mouth 2 (two) times daily with a meal. For the first week take only 1 tablet daily   omeprazole 20 MG capsule Commonly known as:  PRILOSEC Take 1 capsule (20 mg total) by mouth daily.   simvastatin 80 MG tablet Commonly known as:  ZOCOR Take 0.5 tablets (40 mg total) by mouth daily.          Objective:   Physical Exam BP 131/60 (BP Location: Left Arm, Patient Position: Sitting, Cuff Size: Normal)   Pulse 78   Temp 98.5 F (36.9 C) (Oral)   Ht 6' (1.829 m)   Wt (!) 312 lb 4 oz (141.6 kg)   SpO2 97%   BMI 42.35 kg/m  General:   Well developed, well nourished . NAD.  HEENT:  Normocephalic . Face symmetric, atraumatic Neck:  No JVD at 45 Lungs:  CTA B Normal respiratory effort, no intercostal retractions, no accessory muscle use. Heart: RRR,  no murmur.  no pretibial edema bilaterally  Abdomen:  Not distended, soft, non-tender. No rebound or rigidity.  Skin: Not pale. Not jaundice Neurologic:  alert & oriented X3.  Speech normal, gait appropriate for age and unassisted Psych--  Cognition and judgment appear intact.  Cooperative with normal attention span and concentration.  Behavior appropriate. No anxious or depressed appearing.    Assessment & Plan:   Assessment Prediabetes HTN Hyperlipidemia Palpitations, PVCs, h/o bradycardia,s/p -30 day monitor, echo 2014: Normal LV, some LVH due to HTN Myasthenia gravis Morbid obesity  PLAN: Surgical clearance:  76 year old gentleman with history of DM, HTN, hyperlipidemia in need of back surgery. He is doing well clinically, last year he tolerated a right total shoulder replacement surgery without reported problems. No history of heart disease, echocardiogram 2014 for palpitation showed a normal LV with some LVH. EKG today: Sinus rhythm, at baseline. Plan: We'll clear him for surgery, strongly recommend to discuss with anesthesia his diagnosis of myasthenia gravis. DM: Recently started metformin, check LFTs. Next visit 9- 2018

## 2017-05-10 NOTE — Telephone Encounter (Signed)
Surgical Clearance form and OV notes faxed to [facility]: Orson Slick at [phone]. Forms sent for scanning.

## 2017-05-14 ENCOUNTER — Ambulatory Visit: Payer: Self-pay | Admitting: Physician Assistant

## 2017-05-26 ENCOUNTER — Other Ambulatory Visit: Payer: Self-pay | Admitting: Internal Medicine

## 2017-05-30 NOTE — Pre-Procedure Instructions (Signed)
[name] [name]  05/30/2017      [id number] [address], [place] [address] Phone: [phone] Fax: [phone]    Your procedure is scheduled on August 15  Report to [facility] at 1100 A.M.  Call this number if you have problems the morning of surgery:  [phone]   Remember:  Do not eat food or drink liquids after midnight.  Continue all other medications as directed by your physician except follow these instructions about you medications    Take these medicines the morning of surgery with A SIP OF WATER acetaminophen (TYLENOL),  omeprazole (PRILOSEC)   7 days prior to surgery STOP taking any Aleve, Naproxen, Ibuprofen, Motrin, Advil, Goody's, BC's, all herbal medications, fish oil, and all vitamins  Follow your doctors instructions regarding your Aspirin.  If no instructions were given by the doctor you will need to call the office to get instructions.  Your pre admission RN will also call for those instructions  WHAT DO I DO ABOUT MY DIABETES MEDICATION?   Marland Kitchen Do not take oral diabetes medicines (pills) the morning of surgery. metFORMIN (GLUCOPHAGE)     How to Manage Your Diabetes Before and After Surgery  Why is it important to control my blood sugar before and after surgery? . Improving blood sugar levels before and after surgery helps healing and can limit problems. . A way of improving blood sugar control is eating a healthy diet by: o  Eating less sugar and carbohydrates o  Increasing activity/exercise o  Talking with your doctor about reaching your blood sugar goals . High blood sugars (greater than 180 mg/dL) can raise your risk of infections and slow your recovery, so you will need to focus on controlling your diabetes during the weeks before surgery. . Make sure that the doctor who takes care of your diabetes knows about your planned surgery including the date and location.  How do  I manage my blood sugar before surgery? . Check your blood sugar at least 4 times a day, starting 2 days before surgery, to make sure that the level is not too high or low. o Check your blood sugar the morning of your surgery when you wake up and every 2 hours until you get to the Short Stay unit. . If your blood sugar is less than 70 mg/dL, you will need to treat for low blood sugar: o Do not take insulin. o Treat a low blood sugar (less than 70 mg/dL) with  cup of clear juice (cranberry or apple), 4 glucose tablets, OR glucose gel. o Recheck blood sugar in 15 minutes after treatment (to make sure it is greater than 70 mg/dL). If your blood sugar is not greater than 70 mg/dL on recheck, call [phone] for further instructions. . Report your blood sugar to the short stay nurse when you get to Short Stay.  . If you are admitted to the hospital after surgery: o Your blood sugar will be checked by the staff and you will probably be given insulin after surgery (instead of oral diabetes medicines) to make sure you have good blood sugar levels. o The goal for blood sugar control after surgery is 80-180 mg/dL.    Do not wear jewelry  Do not wear lotions, powders, or cologne, or deoderant.  Men may shave face and neck.  Do not bring valuables to the hospital.  [facility] is not responsible for any belongings  or valuables.  Contacts, dentures or bridgework may not be worn into surgery.  Leave your suitcase in the car.  After surgery it may be brought to your room.  For patients admitted to the hospital, discharge time will be determined by your treatment team.  Patients discharged the day of surgery will not be allowed to drive home.    Special instructions:   [facility]- Preparing For Surgery  Before surgery, you can play an important role. Because skin is not sterile, your skin needs to be as free of germs as possible. You can reduce the number of germs on your skin by washing with CHG  (chlorahexidine gluconate) Soap before surgery.  CHG is an antiseptic cleaner which kills germs and bonds with the skin to continue killing germs even after washing.  Please do not use if you have an allergy to CHG or antibacterial soaps. If your skin becomes reddened/irritated stop using the CHG.  Do not shave (including legs and underarms) for at least 48 hours prior to first CHG shower. It is OK to shave your face.  Please follow these instructions carefully.   1. Shower the NIGHT BEFORE SURGERY and the MORNING OF SURGERY with CHG.   2. If you chose to wash your hair, wash your hair first as usual with your normal shampoo.  3. After you shampoo, rinse your hair and body thoroughly to remove the shampoo.  4. Use CHG as you would any other liquid soap. You can apply CHG directly to the skin and wash gently with a scrungie or a clean washcloth.   5. Apply the CHG Soap to your body ONLY FROM THE NECK DOWN.  Do not use on open wounds or open sores. Avoid contact with your eyes, ears, mouth and genitals (private parts). Wash genitals (private parts) with your normal soap.  6. Wash thoroughly, paying special attention to the area where your surgery will be performed.  7. Thoroughly rinse your body with warm water from the neck down.  8. DO NOT shower/wash with your normal soap after using and rinsing off the CHG Soap.  9. Pat yourself dry with a CLEAN TOWEL.   10. Wear CLEAN PAJAMAS   11. Place CLEAN SHEETS on your bed the night of your first shower and DO NOT SLEEP WITH PETS.    Day of Surgery: Do not apply any deodorants/lotions. Please wear clean clothes to the hospital/surgery center.      Please read over the following fact sheets that you were given.

## 2017-05-31 ENCOUNTER — Encounter (HOSPITAL_COMMUNITY): Payer: Self-pay

## 2017-05-31 ENCOUNTER — Encounter (HOSPITAL_COMMUNITY)
Admission: RE | Admit: 2017-05-31 | Discharge: 2017-05-31 | Disposition: A | Discharge status: Home / Self Care | Payer: Medicare Other | Source: Ambulatory Visit | Attending: Orthopedic Surgery | Admitting: Orthopedic Surgery

## 2017-05-31 DIAGNOSIS — Z01812 Encounter for preprocedural laboratory examination: Secondary | ICD-10-CM | POA: Insufficient documentation

## 2017-05-31 HISTORY — DX: Type 2 diabetes mellitus without complications: E11.9

## 2017-05-31 HISTORY — DX: Personal history of other medical treatment: Z92.89

## 2017-05-31 HISTORY — DX: Serous retinal detachment, left eye: H33.22

## 2017-05-31 LAB — HEMOGLOBIN A1C
HEMOGLOBIN A1C: 6.6 % — AB (ref 4.8–5.6)
Mean Plasma Glucose: 142.72 mg/dL

## 2017-05-31 LAB — BASIC METABOLIC PANEL
ANION GAP: 12 (ref 5–15)
BUN: 10 mg/dL (ref 6–20)
CALCIUM: 8.9 mg/dL (ref 8.9–10.3)
CHLORIDE: 101 mmol/L (ref 101–111)
CO2: 24 mmol/L (ref 22–32)
CREATININE: 1.14 mg/dL (ref 0.61–1.24)
GFR calc non Af Amer: >60 mL/min (ref >60)
Glucose, Bld: 112 mg/dL — ABNORMAL HIGH (ref 65–99)
Potassium: 3.9 mmol/L (ref 3.5–5.1)
SODIUM: 137 mmol/L (ref 135–145)

## 2017-05-31 LAB — CBC
HCT: 40.9 % (ref 39.0–52.0)
HEMOGLOBIN: 13.6 g/dL (ref 13.0–17.0)
MCH: 29.8 pg (ref 26.0–34.0)
MCHC: 33.3 g/dL (ref 30.0–36.0)
MCV: 89.5 fL (ref 78.0–100.0)
PLATELETS: 290 10*3/uL (ref 150–400)
RBC: 4.57 MIL/uL (ref 4.22–5.81)
RDW: 13.5 % (ref 11.5–15.5)
WBC: 7.6 10*3/uL (ref 4.0–10.5)

## 2017-05-31 LAB — TYPE AND SCREEN
ABO/RH(D): O POS
Antibody Screen: NEGATIVE

## 2017-05-31 LAB — ABO/RH: ABO/RH(D): O POS

## 2017-05-31 LAB — GLUCOSE, CAPILLARY: Glucose-Capillary: 115 mg/dL — ABNORMAL HIGH (ref 65–99)

## 2017-05-31 LAB — SURGICAL PCR SCREEN
MRSA, PCR: NEGATIVE
Staphylococcus aureus: NEGATIVE

## 2017-05-31 NOTE — Progress Notes (Signed)
   05/31/17 1058  OBSTRUCTIVE SLEEP APNEA  Have you ever been diagnosed with sleep apnea through a sleep study? No  Do you snore loudly (loud enough to be heard through closed doors)?  1  Do you often feel tired, fatigued, or sleepy during the daytime (such as falling asleep during driving or talking to someone)? 1  Has anyone observed you stop breathing during your sleep? 0  Do you have, or are you being treated for high blood pressure? 1  BMI more than 35 kg/m2? 1  Age > 50 (1-yes) 1  Neck circumference greater than:Male 16 inches or larger, Male 17inches or larger? 1  Male Gender (Yes=1) 1  Obstructive Sleep Apnea Score 7  Score 5 or greater  Results sent to PCP

## 2017-06-01 ENCOUNTER — Encounter (HOSPITAL_COMMUNITY): Payer: Self-pay

## 2017-06-01 DIAGNOSIS — M5441 Lumbago with sciatica, right side: Secondary | ICD-10-CM | POA: Diagnosis not present

## 2017-06-01 DIAGNOSIS — G8929 Other chronic pain: Secondary | ICD-10-CM | POA: Diagnosis not present

## 2017-06-01 DIAGNOSIS — M5442 Lumbago with sciatica, left side: Secondary | ICD-10-CM | POA: Diagnosis not present

## 2017-06-01 NOTE — Progress Notes (Signed)
Anesthesia Chart Review:  Pt is a 76 year old male scheduled for L3-5 decompression, in situ fusion L3-5 and 06/06/2017 with Melina Schools, M.D.  - PCP is [name], MD who cleared pt for surgery at last office visit 05/10/17 - Cardiologist is Mertie Moores, MD who sees pt for palpitations determined to be PVCs; last office visit 11/24/15 - Neurologist is Metta Clines, DO; last office visit 10/13/15.   PMH includes:  HTN, dysrhythmia (palpitations thought to be PVCs), bradycardia, DM, myasthenia gravis (ocular; off medication for it per neuro), GERD. Former smoker. S/p R shoulder arthroplasty 06/22/16. S/p L TKA 09/10/12  Anesthesia history: Pt reported at PAT he has been too bradycardic in the past to have general anesthesia.  Had [place] 06/22/16 without issue.   Medications include: ASA, hctz, losartan, metformin, prilosec, potassium, simvastatin. Last dose ASA 06/07/16.   BP 140/62   Pulse 63   Temp 36.7 C   Resp 20   Ht 6' (1.829 m)   SpO2 98%    Preoperative labs reviewed.  HbA1c 6.6, glucose 112  EKG 05/10/17: Sinus  Rhythm. First degree A-V block. PRi = 248. 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  Carotid duplex 07/30/14: No significant carotid plaque or stenosis.  Echo 05/29/13:  - Left ventricle: The cavity size was normal. Wall thickness was increased in a pattern of severe LVH. Systolic function was normal. The estimated ejection fraction was in the range of 55% to 65%. Wall motion was normal; there were no regional wall motion abnormalities. Doppler parameters are consistent with abnormal left ventricular relaxation (grade 1 diastolic dysfunction). - Aortic valve: Trivial regurgitation. - Atrial septum: No defect or patent foramen ovale was identified.  Holter monitor 05/22/13: sinus bradycardia with PVCs  If no changes, I anticipate pt can proceed with surgery as scheduled.   Willeen Cass, FNP-BC [facility] Short Stay Surgical  Center/Anesthesiology Phone: [phone] 06/01/2017 2:06 PM

## 2017-06-02 ENCOUNTER — Other Ambulatory Visit: Payer: Self-pay | Admitting: Internal Medicine

## 2017-06-03 ENCOUNTER — Other Ambulatory Visit: Payer: Self-pay | Admitting: Internal Medicine

## 2017-06-05 MED ORDER — DEXTROSE 5 % IV SOLN
3.0000 g | INTRAVENOUS | Status: AC
  Administered 2017-06-06: 3 g via INTRAVENOUS
  Filled 2017-06-05: qty 3

## 2017-06-06 ENCOUNTER — Inpatient Hospital Stay (HOSPITAL_COMMUNITY)
Admission: RE | Admit: 2017-06-06 | Discharge: 2017-06-08 | DRG: 460 | Disposition: A | Discharge status: Home / Self Care | Payer: Medicare Other | Source: Ambulatory Visit | Attending: Orthopedic Surgery | Admitting: Orthopedic Surgery

## 2017-06-06 ENCOUNTER — Inpatient Hospital Stay (HOSPITAL_COMMUNITY)
Admission: RE | Disposition: A | Discharge status: Home / Self Care | Payer: Self-pay | Source: Ambulatory Visit | Attending: Orthopedic Surgery

## 2017-06-06 ENCOUNTER — Inpatient Hospital Stay (HOSPITAL_COMMUNITY): Payer: Medicare Other

## 2017-06-06 ENCOUNTER — Inpatient Hospital Stay (HOSPITAL_COMMUNITY): Payer: Medicare Other | Admitting: Emergency Medicine

## 2017-06-06 ENCOUNTER — Inpatient Hospital Stay (HOSPITAL_COMMUNITY): Payer: Medicare Other | Admitting: Certified Registered Nurse Anesthetist

## 2017-06-06 DIAGNOSIS — Z419 Encounter for procedure for purposes other than remedying health state, unspecified: Secondary | ICD-10-CM

## 2017-06-06 DIAGNOSIS — Z8249 Family history of ischemic heart disease and other diseases of the circulatory system: Secondary | ICD-10-CM | POA: Diagnosis not present

## 2017-06-06 DIAGNOSIS — G7000 Myasthenia gravis without (acute) exacerbation: Secondary | ICD-10-CM | POA: Diagnosis present

## 2017-06-06 DIAGNOSIS — M4316 Spondylolisthesis, lumbar region: Secondary | ICD-10-CM | POA: Diagnosis not present

## 2017-06-06 DIAGNOSIS — R339 Retention of urine, unspecified: Secondary | ICD-10-CM | POA: Diagnosis not present

## 2017-06-06 DIAGNOSIS — Z8261 Family history of arthritis: Secondary | ICD-10-CM

## 2017-06-06 DIAGNOSIS — M48061 Spinal stenosis, lumbar region without neurogenic claudication: Secondary | ICD-10-CM | POA: Diagnosis not present

## 2017-06-06 DIAGNOSIS — M48062 Spinal stenosis, lumbar region with neurogenic claudication: Principal | ICD-10-CM | POA: Diagnosis present

## 2017-06-06 DIAGNOSIS — K219 Gastro-esophageal reflux disease without esophagitis: Secondary | ICD-10-CM | POA: Diagnosis not present

## 2017-06-06 DIAGNOSIS — Z79899 Other long term (current) drug therapy: Secondary | ICD-10-CM | POA: Diagnosis not present

## 2017-06-06 DIAGNOSIS — I10 Essential (primary) hypertension: Secondary | ICD-10-CM | POA: Diagnosis present

## 2017-06-06 DIAGNOSIS — Z6841 Body Mass Index (BMI) 40.0 and over, adult: Secondary | ICD-10-CM

## 2017-06-06 DIAGNOSIS — Z87891 Personal history of nicotine dependence: Secondary | ICD-10-CM

## 2017-06-06 DIAGNOSIS — Z96652 Presence of left artificial knee joint: Secondary | ICD-10-CM | POA: Diagnosis not present

## 2017-06-06 DIAGNOSIS — Z86010 Personal history of colonic polyps: Secondary | ICD-10-CM | POA: Diagnosis not present

## 2017-06-06 DIAGNOSIS — Z833 Family history of diabetes mellitus: Secondary | ICD-10-CM | POA: Diagnosis not present

## 2017-06-06 DIAGNOSIS — Z7984 Long term (current) use of oral hypoglycemic drugs: Secondary | ICD-10-CM | POA: Diagnosis not present

## 2017-06-06 DIAGNOSIS — E7800 Pure hypercholesterolemia, unspecified: Secondary | ICD-10-CM | POA: Diagnosis present

## 2017-06-06 DIAGNOSIS — E785 Hyperlipidemia, unspecified: Secondary | ICD-10-CM | POA: Diagnosis not present

## 2017-06-06 DIAGNOSIS — Z7982 Long term (current) use of aspirin: Secondary | ICD-10-CM | POA: Diagnosis not present

## 2017-06-06 DIAGNOSIS — M5136 Other intervertebral disc degeneration, lumbar region: Secondary | ICD-10-CM | POA: Diagnosis not present

## 2017-06-06 DIAGNOSIS — E119 Type 2 diabetes mellitus without complications: Secondary | ICD-10-CM | POA: Diagnosis not present

## 2017-06-06 DIAGNOSIS — Z9889 Other specified postprocedural states: Secondary | ICD-10-CM

## 2017-06-06 HISTORY — PX: LUMBAR LAMINECTOMY/DECOMPRESSION MICRODISCECTOMY: SHX5026

## 2017-06-06 LAB — GLUCOSE, CAPILLARY
GLUCOSE-CAPILLARY: 128 mg/dL — AB (ref 65–99)
Glucose-Capillary: 141 mg/dL — ABNORMAL HIGH (ref 65–99)
Glucose-Capillary: 144 mg/dL — ABNORMAL HIGH (ref 65–99)

## 2017-06-06 SURGERY — LUMBAR LAMINECTOMY/DECOMPRESSION MICRODISCECTOMY 2 LEVELS
Anesthesia: General

## 2017-06-06 MED ORDER — MAGNESIUM CITRATE PO SOLN
0.5000 | Freq: Once | ORAL | Status: AC
  Administered 2017-06-06: 0.5 via ORAL

## 2017-06-06 MED ORDER — GLYCOPYRROLATE 0.2 MG/ML IJ SOLN
INTRAMUSCULAR | Status: DC | PRN
  Administered 2017-06-06: 0.2 mg via INTRAVENOUS

## 2017-06-06 MED ORDER — SUGAMMADEX SODIUM 200 MG/2ML IV SOLN
INTRAVENOUS | Status: DC | PRN
  Administered 2017-06-06: 400 mg via INTRAVENOUS

## 2017-06-06 MED ORDER — ACETAMINOPHEN 325 MG PO TABS
650.0000 mg | ORAL_TABLET | ORAL | Status: DC | PRN
  Administered 2017-06-07 (×3): 650 mg via ORAL
  Filled 2017-06-06 (×3): qty 2

## 2017-06-06 MED ORDER — MAGNESIUM CITRATE PO SOLN
1.0000 | Freq: Once | ORAL | Status: AC | PRN
  Administered 2017-06-08: 1 via ORAL
  Filled 2017-06-06: qty 296

## 2017-06-06 MED ORDER — ONDANSETRON HCL 4 MG/2ML IJ SOLN
INTRAMUSCULAR | Status: DC | PRN
  Administered 2017-06-06: 4 mg via INTRAVENOUS

## 2017-06-06 MED ORDER — ONDANSETRON HCL 4 MG PO TABS
4.0000 mg | ORAL_TABLET | Freq: Four times a day (QID) | ORAL | Status: DC | PRN
  Administered 2017-06-08: 4 mg via ORAL
  Filled 2017-06-06: qty 1

## 2017-06-06 MED ORDER — LACTATED RINGERS IV SOLN: INTRAVENOUS | Status: DC

## 2017-06-06 MED ORDER — SODIUM CHLORIDE 0.9 % IV SOLN: 250.0000 mL | INTRAVENOUS | Status: DC

## 2017-06-06 MED ORDER — OXYCODONE HCL 5 MG PO TABS
5.0000 mg | ORAL_TABLET | ORAL | Status: DC | PRN
  Administered 2017-06-06 – 2017-06-08 (×8): 10 mg via ORAL
  Filled 2017-06-06 (×8): qty 2

## 2017-06-06 MED ORDER — PHENYLEPHRINE 40 MCG/ML (10ML) SYRINGE FOR IV PUSH (FOR BLOOD PRESSURE SUPPORT)
PREFILLED_SYRINGE | INTRAVENOUS | Status: AC
  Filled 2017-06-06: qty 10

## 2017-06-06 MED ORDER — METOPROLOL TARTRATE 5 MG/5ML IV SOLN
INTRAVENOUS | Status: AC
  Filled 2017-06-06: qty 5

## 2017-06-06 MED ORDER — PHENYLEPHRINE HCL 10 MG/ML IJ SOLN
INTRAVENOUS | Status: DC | PRN
  Administered 2017-06-06: 25 ug/min via INTRAVENOUS

## 2017-06-06 MED ORDER — MORPHINE SULFATE (PF) 4 MG/ML IV SOLN: 2.0000 mg | INTRAVENOUS | Status: DC | PRN

## 2017-06-06 MED ORDER — ARTIFICIAL TEARS OPHTHALMIC OINT
TOPICAL_OINTMENT | OPHTHALMIC | Status: DC | PRN
  Administered 2017-06-06: 1 via OPHTHALMIC

## 2017-06-06 MED ORDER — PROPOFOL 10 MG/ML IV BOLUS
INTRAVENOUS | Status: AC
  Filled 2017-06-06: qty 20

## 2017-06-06 MED ORDER — OXYCODONE-ACETAMINOPHEN 10-325 MG PO TABS: 1.0000 | ORAL_TABLET | ORAL | 0 refills | Status: AC | PRN

## 2017-06-06 MED ORDER — BUPIVACAINE-EPINEPHRINE (PF) 0.25% -1:200000 IJ SOLN
INTRAMUSCULAR | Status: AC
  Filled 2017-06-06: qty 30

## 2017-06-06 MED ORDER — FENTANYL CITRATE (PF) 100 MCG/2ML IJ SOLN
INTRAMUSCULAR | Status: AC
  Filled 2017-06-06: qty 2

## 2017-06-06 MED ORDER — METHOCARBAMOL 500 MG PO TABS
500.0000 mg | ORAL_TABLET | Freq: Four times a day (QID) | ORAL | Status: DC | PRN
  Administered 2017-06-06 – 2017-06-08 (×5): 500 mg via ORAL
  Filled 2017-06-06 (×5): qty 1

## 2017-06-06 MED ORDER — MIDAZOLAM HCL 2 MG/2ML IJ SOLN
INTRAMUSCULAR | Status: DC | PRN
  Administered 2017-06-06 (×2): 1 mg via INTRAVENOUS

## 2017-06-06 MED ORDER — LACTATED RINGERS IV SOLN
INTRAVENOUS | Status: DC
  Administered 2017-06-06 (×3): via INTRAVENOUS

## 2017-06-06 MED ORDER — METHOCARBAMOL 500 MG PO TABS
ORAL_TABLET | ORAL | Status: AC
  Filled 2017-06-06: qty 1

## 2017-06-06 MED ORDER — LIDOCAINE 2% (20 MG/ML) 5 ML SYRINGE
INTRAMUSCULAR | Status: AC
  Filled 2017-06-06: qty 5

## 2017-06-06 MED ORDER — PROPOFOL 10 MG/ML IV BOLUS
INTRAVENOUS | Status: DC | PRN
  Administered 2017-06-06: 200 mg via INTRAVENOUS

## 2017-06-06 MED ORDER — ONDANSETRON HCL 4 MG/2ML IJ SOLN
INTRAMUSCULAR | Status: AC
  Filled 2017-06-06: qty 2

## 2017-06-06 MED ORDER — GELATIN ABSORBABLE 12-7 MM EX MISC
CUTANEOUS | Status: DC | PRN
  Administered 2017-06-06: 1

## 2017-06-06 MED ORDER — BUPIVACAINE-EPINEPHRINE (PF) 0.25% -1:200000 IJ SOLN
INTRAMUSCULAR | Status: DC | PRN
Start: 2017-06-06 — End: 2017-06-06
  Administered 2017-06-06: 10 mL via PERINEURAL

## 2017-06-06 MED ORDER — ALBUMIN HUMAN 5 % IV SOLN
INTRAVENOUS | Status: DC | PRN
  Administered 2017-06-06: 14:00:00 via INTRAVENOUS

## 2017-06-06 MED ORDER — PHENYLEPHRINE HCL 10 MG/ML IJ SOLN
INTRAMUSCULAR | Status: DC | PRN
  Administered 2017-06-06 (×5): 80 ug via INTRAVENOUS

## 2017-06-06 MED ORDER — DOCUSATE SODIUM 100 MG PO CAPS
100.0000 mg | ORAL_CAPSULE | Freq: Two times a day (BID) | ORAL | Status: DC
  Administered 2017-06-06 – 2017-06-08 (×4): 100 mg via ORAL
  Filled 2017-06-06 (×4): qty 1

## 2017-06-06 MED ORDER — ONDANSETRON HCL 4 MG/2ML IJ SOLN
4.0000 mg | Freq: Four times a day (QID) | INTRAMUSCULAR | Status: DC | PRN
  Administered 2017-06-06: 4 mg via INTRAVENOUS
  Filled 2017-06-06: qty 2

## 2017-06-06 MED ORDER — METHOCARBAMOL 500 MG PO TABS: 500.0000 mg | ORAL_TABLET | Freq: Four times a day (QID) | ORAL | 0 refills | Status: DC

## 2017-06-06 MED ORDER — MENTHOL 3 MG MT LOZG
1.0000 | LOZENGE | OROMUCOSAL | Status: DC | PRN
Start: 2017-06-06 — End: 2017-06-08

## 2017-06-06 MED ORDER — METHOCARBAMOL 1000 MG/10ML IJ SOLN
500.0000 mg | Freq: Four times a day (QID) | INTRAVENOUS | Status: DC | PRN
  Filled 2017-06-06: qty 5

## 2017-06-06 MED ORDER — THROMBIN 5000 UNITS EX SOLR
CUTANEOUS | Status: DC | PRN
  Administered 2017-06-06: 5000 [IU] via TOPICAL

## 2017-06-06 MED ORDER — ALBUTEROL SULFATE HFA 108 (90 BASE) MCG/ACT IN AERS
INHALATION_SPRAY | RESPIRATORY_TRACT | Status: DC | PRN
  Administered 2017-06-06: 2 via RESPIRATORY_TRACT

## 2017-06-06 MED ORDER — FENTANYL CITRATE (PF) 250 MCG/5ML IJ SOLN
INTRAMUSCULAR | Status: AC
  Filled 2017-06-06: qty 5

## 2017-06-06 MED ORDER — HEMOSTATIC AGENTS (NO CHARGE) OPTIME
TOPICAL | Status: DC | PRN
  Administered 2017-06-06: 2 via TOPICAL

## 2017-06-06 MED ORDER — ONDANSETRON 4 MG PO TBDP: 4.0000 mg | ORAL_TABLET | Freq: Three times a day (TID) | ORAL | 0 refills | Status: DC | PRN

## 2017-06-06 MED ORDER — ACETAMINOPHEN 10 MG/ML IV SOLN
INTRAVENOUS | Status: AC
Start: 2017-06-06 — End: 2017-06-06
  Filled 2017-06-06: qty 100

## 2017-06-06 MED ORDER — PHENOL 1.4 % MT LIQD: 1.0000 | OROMUCOSAL | Status: DC | PRN

## 2017-06-06 MED ORDER — 0.9 % SODIUM CHLORIDE (POUR BTL) OPTIME
TOPICAL | Status: DC | PRN
  Administered 2017-06-06 (×2): 1000 mL

## 2017-06-06 MED ORDER — ROCURONIUM BROMIDE 100 MG/10ML IV SOLN
INTRAVENOUS | Status: DC | PRN
  Administered 2017-06-06: 20 mg via INTRAVENOUS
  Administered 2017-06-06: 10 mg via INTRAVENOUS
  Administered 2017-06-06: 20 mg via INTRAVENOUS
  Administered 2017-06-06: 10 mg via INTRAVENOUS
  Administered 2017-06-06: 30 mg via INTRAVENOUS
  Administered 2017-06-06: 10 mg via INTRAVENOUS

## 2017-06-06 MED ORDER — ROCURONIUM BROMIDE 10 MG/ML (PF) SYRINGE
PREFILLED_SYRINGE | INTRAVENOUS | Status: AC
  Filled 2017-06-06: qty 5

## 2017-06-06 MED ORDER — SODIUM CHLORIDE 0.9% FLUSH: 3.0000 mL | INTRAVENOUS | Status: DC | PRN

## 2017-06-06 MED ORDER — MIDAZOLAM HCL 2 MG/2ML IJ SOLN
INTRAMUSCULAR | Status: AC
  Filled 2017-06-06: qty 2

## 2017-06-06 MED ORDER — LIDOCAINE HCL (CARDIAC) 20 MG/ML IV SOLN
INTRAVENOUS | Status: DC | PRN
  Administered 2017-06-06: 80 mg via INTRATRACHEAL

## 2017-06-06 MED ORDER — ACETAMINOPHEN 10 MG/ML IV SOLN
INTRAVENOUS | Status: DC | PRN
  Administered 2017-06-06: 1000 mg via INTRAVENOUS

## 2017-06-06 MED ORDER — METOPROLOL TARTARATE 1 MG/ML SYRINGE (5ML)
Status: DC | PRN
Start: 2017-06-06 — End: 2017-06-06
  Administered 2017-06-06: 1 mg via INTRAVENOUS

## 2017-06-06 MED ORDER — ARTIFICIAL TEARS OPHTHALMIC OINT
TOPICAL_OINTMENT | OPHTHALMIC | Status: AC
  Filled 2017-06-06: qty 3.5

## 2017-06-06 MED ORDER — CEFAZOLIN SODIUM-DEXTROSE 2-4 GM/100ML-% IV SOLN
2.0000 g | Freq: Three times a day (TID) | INTRAVENOUS | Status: AC
Start: 2017-06-06 — End: 2017-06-07
  Administered 2017-06-06 – 2017-06-07 (×2): 2 g via INTRAVENOUS
  Filled 2017-06-06 (×2): qty 100

## 2017-06-06 MED ORDER — ONDANSETRON HCL 4 MG/2ML IJ SOLN: 4.0000 mg | Freq: Once | INTRAMUSCULAR | Status: DC | PRN

## 2017-06-06 MED ORDER — FENTANYL CITRATE (PF) 100 MCG/2ML IJ SOLN
INTRAMUSCULAR | Status: AC
  Administered 2017-06-06: 50 ug via INTRAVENOUS
  Filled 2017-06-06: qty 2

## 2017-06-06 MED ORDER — ACETAMINOPHEN 650 MG RE SUPP: 650.0000 mg | RECTAL | Status: DC | PRN

## 2017-06-06 MED ORDER — THROMBIN 20000 UNITS EX SOLR
CUTANEOUS | Status: AC
  Filled 2017-06-06: qty 20000

## 2017-06-06 MED ORDER — FENTANYL CITRATE (PF) 100 MCG/2ML IJ SOLN
25.0000 ug | INTRAMUSCULAR | Status: DC | PRN
  Administered 2017-06-06 (×3): 50 ug via INTRAVENOUS

## 2017-06-06 MED ORDER — FENTANYL CITRATE (PF) 250 MCG/5ML IJ SOLN
INTRAMUSCULAR | Status: DC | PRN
  Administered 2017-06-06: 50 ug via INTRAVENOUS
  Administered 2017-06-06: 100 ug via INTRAVENOUS
  Administered 2017-06-06 (×2): 50 ug via INTRAVENOUS
  Administered 2017-06-06: 100 ug via INTRAVENOUS
  Administered 2017-06-06: 50 ug via INTRAVENOUS

## 2017-06-06 MED ORDER — SODIUM CHLORIDE 0.9% FLUSH
3.0000 mL | Freq: Two times a day (BID) | INTRAVENOUS | Status: DC
  Administered 2017-06-07 (×2): 3 mL via INTRAVENOUS

## 2017-06-06 MED ORDER — POLYETHYLENE GLYCOL 3350 17 G PO PACK: 17.0000 g | PACK | Freq: Every day | ORAL | Status: DC | PRN

## 2017-06-06 SURGICAL SUPPLY — 66 items
BNDG GAUZE ELAST 4 BULKY (GAUZE/BANDAGES/DRESSINGS) ×3 IMPLANT
BUR EGG ELITE 4.0 (BURR) IMPLANT
BUR EGG ELITE 4.0MM (BURR)
CLOSURE WOUND 1/2 X4 (GAUZE/BANDAGES/DRESSINGS) ×2
CORDS BIPOLAR (ELECTRODE) ×3 IMPLANT
DRAIN CHANNEL 15F RND FF W/TCR (WOUND CARE) ×3 IMPLANT
DRAPE C-ARM 42X72 X-RAY (DRAPES) ×3 IMPLANT
DRAPE POUCH INSTRU U-SHP 10X18 (DRAPES) ×3 IMPLANT
DRAPE SURG 17X11 SM STRL (DRAPES) ×3 IMPLANT
DRAPE U-SHAPE 47X51 STRL (DRAPES) ×3 IMPLANT
DRSG AQUACEL AG ADV 3.5X 6 (GAUZE/BANDAGES/DRESSINGS) IMPLANT
DRSG MEPILEX BORDER 4X4 (GAUZE/BANDAGES/DRESSINGS) ×3 IMPLANT
DRSG OPSITE POSTOP 4X10 (GAUZE/BANDAGES/DRESSINGS) ×3 IMPLANT
DURAPREP 26ML APPLICATOR (WOUND CARE) ×3 IMPLANT
ELECT BLADE 4.0 EZ CLEAN MEGAD (MISCELLANEOUS) ×3
ELECT CAUTERY BLADE 6.4 (BLADE) ×3 IMPLANT
ELECT PENCIL ROCKER SW 15FT (MISCELLANEOUS) ×3 IMPLANT
ELECT REM PT RETURN 9FT ADLT (ELECTROSURGICAL) ×3
ELECTRODE BLDE 4.0 EZ CLN MEGD (MISCELLANEOUS) ×1 IMPLANT
ELECTRODE REM PT RTRN 9FT ADLT (ELECTROSURGICAL) ×1 IMPLANT
EVACUATOR SILICONE 100CC (DRAIN) ×3 IMPLANT
GLOVE BIO SURGEON STRL SZ 6.5 (GLOVE) ×2 IMPLANT
GLOVE BIO SURGEONS STRL SZ 6.5 (GLOVE) ×1
GLOVE BIOGEL PI IND STRL 6.5 (GLOVE) ×2 IMPLANT
GLOVE BIOGEL PI IND STRL 8.5 (GLOVE) ×1 IMPLANT
GLOVE BIOGEL PI INDICATOR 6.5 (GLOVE) ×4
GLOVE BIOGEL PI INDICATOR 8.5 (GLOVE) ×2
GLOVE SS BIOGEL STRL SZ 8.5 (GLOVE) ×2 IMPLANT
GLOVE SUPERSENSE BIOGEL SZ 8.5 (GLOVE) ×4
GLOVE SURG SS PI 6.5 STRL IVOR (GLOVE) ×3 IMPLANT
GOWN STRL REUS W/ TWL LRG LVL3 (GOWN DISPOSABLE) ×1 IMPLANT
GOWN STRL REUS W/TWL 2XL LVL3 (GOWN DISPOSABLE) ×6 IMPLANT
GOWN STRL REUS W/TWL LRG LVL3 (GOWN DISPOSABLE) ×2
KIT BASIN OR (CUSTOM PROCEDURE TRAY) ×3 IMPLANT
NEEDLE 22X1 1/2 (OR ONLY) (NEEDLE) ×3 IMPLANT
NEEDLE SPNL 18GX3.5 QUINCKE PK (NEEDLE) ×6 IMPLANT
NS IRRIG 1000ML POUR BTL (IV SOLUTION) ×3 IMPLANT
PACK LAMINECTOMY ORTHO (CUSTOM PROCEDURE TRAY) ×3 IMPLANT
PACK UNIVERSAL I (CUSTOM PROCEDURE TRAY) ×3 IMPLANT
PATTIES SURGICAL .5 X.5 (GAUZE/BANDAGES/DRESSINGS) IMPLANT
PATTIES SURGICAL .5 X1 (DISPOSABLE) ×3 IMPLANT
SPOGE SURGIFLO 8M (HEMOSTASIS) ×4
SPONGE LAP 4X18 X RAY DECT (DISPOSABLE) ×6 IMPLANT
SPONGE SURGIFLO 8M (HEMOSTASIS) ×2 IMPLANT
SPONGE SURGIFOAM ABS GEL 100 (HEMOSTASIS) ×3 IMPLANT
STAPLER VISISTAT 35W (STAPLE) IMPLANT
STRIP CLOSURE SKIN 1/2X4 (GAUZE/BANDAGES/DRESSINGS) ×4 IMPLANT
SURGIFLO W/THROMBIN 8M KIT (HEMOSTASIS) IMPLANT
SUT BONE WAX W31G (SUTURE) ×3 IMPLANT
SUT MON AB 3-0 SH 27 (SUTURE) ×2
SUT MON AB 3-0 SH27 (SUTURE) ×1 IMPLANT
SUT SILK 2 0 FS (SUTURE) ×3 IMPLANT
SUT VIC AB 0 CT1 27 (SUTURE) ×2
SUT VIC AB 0 CT1 27XBRD ANBCTR (SUTURE) ×1 IMPLANT
SUT VIC AB 1 CT1 18XCR BRD 8 (SUTURE) ×1 IMPLANT
SUT VIC AB 1 CT1 27 (SUTURE)
SUT VIC AB 1 CT1 27XBRD ANTBC (SUTURE) IMPLANT
SUT VIC AB 1 CT1 8-18 (SUTURE) ×2
SUT VIC AB 2-0 CT1 18 (SUTURE) ×6 IMPLANT
SUT VICRYL 0 UR6 27IN ABS (SUTURE) IMPLANT
SYR BULB IRRIGATION 50ML (SYRINGE) ×3 IMPLANT
SYR CONTROL 10ML LL (SYRINGE) ×3 IMPLANT
TOWEL OR 17X26 10 PK STRL BLUE (TOWEL DISPOSABLE) ×6 IMPLANT
TRAY FOLEY W/METER SILVER 16FR (SET/KITS/TRAYS/PACK) IMPLANT
WATER STERILE IRR 1000ML POUR (IV SOLUTION) ×3 IMPLANT
YANKAUER SUCT BULB TIP NO VENT (SUCTIONS) ×3 IMPLANT

## 2017-06-06 NOTE — Brief Op Note (Signed)
06/06/2017  4:12 PM  PATIENT:  [name] [name]  76 y.o. male  PRE-OPERATIVE DIAGNOSIS:  Lumbar stenosis with slip  POST-OPERATIVE DIAGNOSIS:  Lumbar stenosis with slip  PROCEDURE:  Procedure(s): Decompression L3-5, insitu fusion L3-5  (N/A)  SURGEON:  Surgeon(s) and Role:    Melina Schools, MD - Primary  PHYSICIAN ASSISTANT:   ASSISTANTS: Carmen Mayo   ANESTHESIA:   general  EBL:  Total I/O In: 2250 [I.V.:2000; IV Piggyback:250] Out: 300 [Urine:150; Blood:150]  BLOOD ADMINISTERED:none  DRAINS: JP   LOCAL MEDICATIONS USED:  MARCAINE     SPECIMEN:  No Specimen  DISPOSITION OF SPECIMEN:  N/A  COUNTS:  YES  TOURNIQUET:  * No tourniquets in log *  DICTATION: .Dragon Dictation  PLAN OF CARE: Admit to inpatient   PATIENT DISPOSITION:  PACU - hemodynamically stable.

## 2017-06-06 NOTE — Anesthesia Preprocedure Evaluation (Addendum)
Anesthesia Evaluation  Patient identified by MRN, date of birth, ID band Patient awake    Reviewed: Allergy & Precautions, H&P , NPO status , Patient's Chart, lab work & pertinent test results, reviewed documented beta blocker date and time   Airway Mallampati: III  TM Distance: >3 FB Neck ROM: full    Dental no notable dental hx. (+) Dental Advisory Given, Partial Upper, Missing, Poor Dentition   Pulmonary former smoker,    Pulmonary exam normal breath sounds clear to auscultation       Cardiovascular hypertension, On Medications  Rhythm:regular Rate:Normal     Neuro/Psych Myasthenia Gravis  Neuromuscular disease    GI/Hepatic Neg liver ROS, GERD  ,  Endo/Other  diabetesMorbid obesity  Renal/GU negative Renal ROS     Musculoskeletal  (+) Arthritis ,   Abdominal   Peds  Hematology negative hematology ROS (+)   Anesthesia Other Findings   Reproductive/Obstetrics                           Anesthesia Physical  Anesthesia Plan  ASA: III  Anesthesia Plan: General   Post-op Pain Management: GA combined w/ Regional for post-op pain   Induction: Intravenous  PONV Risk Score and Plan: 2 and Ondansetron, Dexamethasone and Treatment may vary due to age or medical condition  Airway Management Planned: Oral ETT  Additional Equipment:   Intra-op Plan:   Post-operative Plan: Extubation in OR  Informed Consent: I have reviewed the patients History and Physical, chart, labs and discussed the procedure including the risks, benefits and alternatives for the proposed anesthesia with the patient or authorized representative who has indicated his/her understanding and acceptance.   Dental Advisory Given and Dental advisory given  Plan Discussed with: CRNA  Anesthesia Plan Comments:        Anesthesia Quick Evaluation

## 2017-06-06 NOTE — Op Note (Signed)
Operative report.  Preoperative diagnosis. Lumbar spinal stenosis. 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 Same.  Operative procedure. Lumbar decompression L3-L5. In situ fusion L4-5.  Complications. None.  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  Indications. This is a pleasant 75 year old morbidly obese gentleman with complaints of lateral leg pain. Imaging studies confirmed presence of lumbar spinal stenosis. As a result of the failure of conservative management in the poor quality of life he elected to proceed with surgery. All appropriate risks benefits and alternatives to surgery were discussed and consent was obtained.  Operative note.  Patient was brought the operating room placed on the operating table. After successful induction of general anesthesia and endotracheal intubation teds SCDs and a Foley were inserted. He was turned prone onto the Wilson frame and all bony prominences well-padded. The back was then prepped and draped in a standard fashion. Timeout was taken to confirm patient procedure and all other pertinent important data.  2 needles were then placed into the back and x-ray was taken to localize the skin incision this was infiltrated with quarter percent Marcaine. A midline incision was made and sharp dissection was carried out down to the deep fascia the deep fascia sharply incised and exposed spinous process from L2-L5. Using a Cobb I stripped the paraspinal muscles to expose the spinous process and lamina of L3 and L4 and a portion of that of L5. I then identified the L3-4-4 5 facet complexes and swept out over then and then exposed the L5 5 and L4 transverse processes. Once this was done I then removed the facet capsule at the L4-5 level in order to facilitate fusion.  A Penfield 4 was placed under the L4 lamina and an x-ray was taken to confirm the appropriate level. Once I confirm the appropriate level using a double-action Leksell rongeur I  removed the spinous process of L4 and the majority of that of L3. 3 mm Kerrison rongeur was used to perform a central laminectomy of L4. I dissected through the very thickened robust ligamentum flavum to eventually identify the thecal sac using a Penfield 4 and [facility] created a plane between the thecal sac and the osteophyte and ligamentum flavum and then used my Kerrison punches to remove the ligamentum flavum and bone spur to complete the central L4 decompression I then went into the lateral recess and dissected out to the medial border of the pedicle. I then went into the foramen adequately decompressed the area. Once this was done I then went to the L3 level a laminotomy was performed using L Kerrison rongeurs of L3. Majority of this was resected and again there was significant stenotic calcified ligamentum flavum. Using the Kerrison rongeur to remove the ligamentum flavum I then passed the neuro patties underneath the remaining portion of L4 completely. I again went into the lateral recess and resected the ligamentum flavum and advanced decompress the lateral recess at L3 foramen. At this point I could easily pass my [facility] elevator superiorly inferiorly along the lateral recess and out each neural foramen bilaterally. The thecal sac had expanded and I had a pedicle to pedicle decompression. At this point I used bipolar cautery to obtain he's hemostasis in 5 coagulated the epidural veins.  High-speed bur was then used to decorticate the transverse processes of 54 in the lateral aspect of the facet complex. Bone graft from that was harvested from the decompression was impacted into the posterior lateral gutter. A drain was placed and brought out through a separate stab  incision. The wound was then copiously irrigated with saline and I made sure hemostasis using bipolar cautery and FloSeal. Final irrigation was done and there was no significant bleeding and hemostasis obtained and there was no  evidence of CSF leak.  The wounds were closed over the drain in a layered fashion with interrupted #1 Vicryl suture, running 0 Vicryl interrupted 2-0 Vicryl and 3-0 Monocryl. The drain was stitched in with a separate 2-0 silk stitch. Steri-Strips dry dressings were then applied and the patient was ultimately extubated transferred the PACU that incident. The end of the case all needle sponge counts were correct there were no adverse intraoperative events

## 2017-06-06 NOTE — Transfer of Care (Signed)
Immediate Anesthesia Transfer of Care Note  Patient: [name] [name]  Procedure(s) Performed: Procedure(s): Decompression L3-5, insitu fusion L3-5  (N/A)  Patient Location: PACU  Anesthesia Type:General  Level of Consciousness: awake, alert  and oriented  Airway & Oxygen Therapy: Patient Spontanous Breathing and Patient connected to face mask oxygen  Post-op Assessment: Report given to RN and Post -op Vital signs reviewed and stable  Post vital signs: Reviewed and stable  Last Vitals:  Vitals:   06/06/17 1123 06/06/17 1652  BP: (!) 153/67 (!) 160/67  Pulse: (!) 54 78  Resp: 18 16  Temp: 36.8 C   SpO2: 96% 99%    Last Pain:  Vitals:   06/06/17 1123  TempSrc: Oral         Complications: No apparent anesthesia complications

## 2017-06-06 NOTE — OR Nursing (Signed)
Dr. Leonides Schanz states that probe is posterior to L4-5. Dr.  Rolena Infante notified.

## 2017-06-06 NOTE — Anesthesia Procedure Notes (Addendum)
Procedure Name: Intubation Date/Time: 06/06/2017 1:22 PM Performed by: [name] D Pre-anesthesia Checklist: Patient identified, Emergency Drugs available, Suction available and Patient being monitored Patient Re-evaluated:Patient Re-evaluated prior to induction Oxygen Delivery Method: Circle system utilized Preoxygenation: Pre-oxygenation with 100% oxygen Induction Type: IV induction Ventilation: Mask ventilation without difficulty Laryngoscope Size: Glidescope (T4) Grade View: Grade II Tube type: Oral Tube size: 7.5 mm Number of attempts: 1 Airway Equipment and Method: Video-laryngoscopy Placement Confirmation: ETT inserted through vocal cords under direct vision,  positive ETCO2 and breath sounds checked- equal and bilateral Secured at: 23 cm Tube secured with: Tape Dental Injury: Teeth and Oropharynx as per pre-operative assessment  Difficulty Due To: Difficulty was anticipated

## 2017-06-06 NOTE — Discharge Instructions (Signed)
Lumbar Diskectomy, Care After °Refer to this sheet in the next few weeks. These instructions provide you with information about caring for yourself after your procedure. Your health care provider may also give you more specific instructions. Your treatment has been planned according to current medical practices, but problems sometimes occur. Call your health care provider if you have any problems or questions after your procedure. °What can I expect after the procedure? °After the procedure, it is common to have: °· Pain. °· Numbness. °· Weakness. ° °Follow these instructions at home: °Medicines °· Take medicines only as directed by your health care provider. °· If you were prescribed an antibiotic medicine, finish all of it even if you start to feel better. °Incision care °· There are many different ways to close and cover an incision, including stitches (sutures), skin glue, and adhesive strips. Follow your health care provider's instructions about: °? Incision care. °? Bandage (dressing) changes and removal. °? Incision closure removal. °· Check your incision area every day for signs of infection. If you cannot see your incision, have someone check it for you. Watch for: °? Redness, swelling, or pain. °? Fluid, blood, or pus. °Activity °· Avoid sitting for longer than 20 minutes at a time or as directed by your health care provider. °· Do not climb stairs more than once each day until your health care provider approves. °· Do not bend at your waist. To pick things up, bend your knees. °· Do not lift anything that is heavier than 10 lb (4.5 kg) or as directed by your health care provider. °· Do not drive a car until your health care provider approves. °· Ask your health care provider when you may return to your normal activities, such as playing sports and going back to work. °· Work with your physical therapist to learn safe movement and exercises to help healing. Do these exercises as directed. °· Take short  walks often. °General instructions °· Do not use any tobacco products, including cigarettes, chewing tobacco, or electronic cigarettes. If you need help quitting, ask your health care provider. °· Follow your health care provider’s instructions about bathing. Do not take baths, shower, swim, or use a hot tub until your health care provider approves. °· Wear your back brace as directed by your health care provider. °· To prevent constipation: °? Drink enough fluid to keep your urine clear or pale yellow. °? Eat plenty of fruits, vegetables, and whole grains. °· Keep all follow-up visits as directed by your health care provider. This is important. This includes any follow-up visits with your physical therapist. °Contact a health care provider if: °· You have a fever. °· You have redness, swelling, or pain in your incision area. °· Your pain is not controlled with medicine. °· You have pain, numbness, or weakness that lasts longer than three weeks after surgery. °· You become constipated. °Get help right away if: °· You have fluid, blood, or pus coming from your incision. °· You have increasing pain, numbness, or weakness. °· You lose control of when you urinate or have a bowel movement (incontinence). °· You have chest pain. °· You have trouble breathing. °This information is not intended to replace advice given to you by your health care provider. Make sure you discuss any questions you have with your health care provider. °Document Released: 09/13/2004 Document Revised: 03/16/2016 Document Reviewed: 06/03/2014 °Elsevier Interactive Patient Education © 2018 [employer] ° °

## 2017-06-06 NOTE — H&P (Signed)
History of Present Illness  The patient is a 76 year old male who comes in today for a preoperative History and Physical. The patient is scheduled for a decompression L3-5 with insitu fusion to be performed by Dr. Duane Lope D. Rolena Infante, MD at [facility] on 06-06-17 . Please see the hospital record for complete dictated history and physical. 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 It is controlled on Metformin.   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 Young; 06/01/2017 9:22 AM) Tobacco use  Tobacco / smoke exposure  Current work status  retired Engineer, agricultural (Currently)  no Children  2, 4 Alcohol use  never consumed alcohol Exercise  Exercises daily; does running / walking, Exercises weekly Illicit drug use  no Marital status  married Living situation  live with spouse Number of flights of stairs before winded  less than 1 Pain Contract  yes Never consumed alcohol  01/01/2016: Never consumed alcohol No history of  drug/alcohol rehab  Not under pain contract   Medication History Tylenol Arthritis Pain (650MG  Tablet ER, Oral) Active. (qd) Aspirin (81MG  Tablet, Oral) Active. (qd) Fish Oil Active. (1000mg  qd) HydroCHLOROthiazide (25MG  Tablet, Oral) Active. (qd) Klor-Con M10 (10MEQ Tablet ER, Oral) Active. (qd) Losartan Potassium (100MG  Tablet, Oral) Active. (qd) Magnesium (250MG  Tablet, Oral) Active. (qd) MetFORMIN HCl (500MG  Tablet, Oral) Active. (bid pc) Omeprazole (20MG  Tablet DR, Oral) Active. (qd) Simvastatin (80MG  Tablet, Oral) Active. (40mg  qd) Medications Reconciled  Past Surgical History  Arthroscopy of Knee  bilateral right Inguinal Hernia Repair  open: bilateral and multiple times Colon Polyp Removal - Colonoscopy   Other Problems Asthma  Heart murmur  High blood pressure  Hypercholesterolemia   Vitals 06/01/2017 9:30 AM Weight: 311 lb Height: 70.5in Body Surface Area: 2.53 m Body Mass Index: 43.99 kg/m  Temp.: 98.26F  Pulse: 71 (Regular)  BP: 176/83 (Sitting, Left Arm, Standard)  General General Appearance-Not in acute distress. Orientation-Oriented X3. Build & Nutrition-Well nourished and Well developed.  Integumentary General Characteristics Surgical Scars - no surgical scar evidence of previous lumbar surgery. Lumbar Spine-Skin examination of the lumbar spine is without deformity, skin lesions, lacerations or abrasions.  Chest and Lung Exam Auscultation Breath sounds - Normal and Clear.  Cardiovascular Auscultation Rhythm - Regular rate and rhythm.  Abdomen Palpation/Percussion Palpation and Percussion of the abdomen reveal - Soft, Non Tender and No Rebound tenderness.  Peripheral Vascular Lower Extremity Palpation - Posterior tibial pulse - Bilateral - 2+. Dorsalis pedis pulse - Bilateral - 2+.  Neurologic Sensation Lower Extremity - Bilateral - sensation is intact in the lower extremity. Reflexes Patellar  Reflex - Bilateral - 2+. Achilles Reflex - Bilateral - 2+. Testing Seated  Straight Leg Raise - Bilateral - Seated straight leg raise negative.  Musculoskeletal Spine/Ribs/Pelvis  Lumbosacral Spine: Inspection and Palpation - Tenderness - left lumbar paraspinals tender to palpation, right lumbar paraspinals tender to palpation, left buttock is tender to palpation and right buttock is tender to palpation. Strength and Tone: Strength - Hip Flexion - Bilateral - 5/5. Knee Extension - Bilateral - 5/5. Knee Flexion - Bilateral - 5/5. Ankle Dorsiflexion - Bilateral - 5/5. Ankle Plantarflexion - Bilateral - 5/5. Heel walk - Bilateral - able to heel walk with moderate difficulty. Toe Walk - Bilateral - able to walk on toes with moderate difficulty. ROM - Flexion - moderately decreased range of motion. Extension - moderately decreased range of motion and painful. Left Lateral Bending - moderately decreased range of motion and painful. Right Lateral Bending - moderately decreased range of motion and painful. Right Rotation - moderately decreased range of motion and painful. Left Rotation - moderately decreased range of motion and painful. Pain - neither flexion or extension is more painful than the other. Lumbosacral Spine - Waddell's Signs - no Waddell's signs present. Lower Extremity Range of Motion - No true hip, knee or ankle pain with range of motion. Gait and Station - [employer] - no assistive devices.  MRI at L3-4, there is a grade 1 anterolisthesis, prominent bulging disc osteophyte complex, right central disc extrusion noted, small left foraminal disc extrusion indenting the thecal sac, moderate thickening of the ligamentum flavum, severe bilateral facet arthrosis noted, severe narrowing of the thecal sac, moderate bilateral foraminal narrowing more pronounced on the left. L4-5, mild grade 1 anterolisthesis of L4 on L5, severe right and moderate left disc height loss, prominent bulging, disc  osteophyte complex, central right subarticular disc extrusion with mild cranial migration, mild thickening of the ligamentum flavum, severe bilateral facet arthrosis, severe facet hypertrophy, moderate-to-severe narrowing of the thecal sac is noted.  Assessment & Plan  Goal Of Surgery: Discussed that goal of surgery is to reduce pain and improve function and quality of life. Patient is aware that despite all appropriate treatment that there pain and function could be the same, worse, or different. Posterior Lumbar Decompression/disectomy: Risks of surgery include infection, bleeding, nerve damage, death, stroke, paralysis, failure to heal, need for further surgery, ongoing or worse pain, need for further surgery, CSF leak, loss of bowel or bladder, and recurrent disc herniation or Stenosis which would necessitate need for further surgery.  Patient has significant spinal stenosis with neurogenic claudication.  Plan on decompression and in situ fusion.  Underlying slip at L4/5 and to decrease risk of progression will move foraward with in situ fusion.  Hardware for fusion is not necessary.

## 2017-06-07 ENCOUNTER — Encounter (HOSPITAL_COMMUNITY): Payer: Self-pay | Admitting: Orthopedic Surgery

## 2017-06-07 LAB — GLUCOSE, CAPILLARY
GLUCOSE-CAPILLARY: 143 mg/dL — AB (ref 65–99)
GLUCOSE-CAPILLARY: 145 mg/dL — AB (ref 65–99)
Glucose-Capillary: 174 mg/dL — ABNORMAL HIGH (ref 65–99)
Glucose-Capillary: 150 mg/dL — ABNORMAL HIGH (ref 65–99)

## 2017-06-07 MED ORDER — TAMSULOSIN HCL 0.4 MG PO CAPS
0.4000 mg | ORAL_CAPSULE | Freq: Every day | ORAL | Status: DC
  Administered 2017-06-07 – 2017-06-08 (×3): 0.4 mg via ORAL
  Filled 2017-06-07 (×3): qty 1

## 2017-06-07 MED ORDER — CEFAZOLIN SODIUM-DEXTROSE 2-4 GM/100ML-% IV SOLN
2.0000 g | Freq: Three times a day (TID) | INTRAVENOUS | Status: AC
  Administered 2017-06-07 (×2): 2 g via INTRAVENOUS
  Filled 2017-06-07 (×2): qty 100

## 2017-06-07 MED FILL — Thrombin For Soln 20000 Unit: CUTANEOUS | Qty: 1 | Status: AC

## 2017-06-07 NOTE — Evaluation (Signed)
Physical Therapy Evaluation Patient Details Name: [name] [name] MRN: [id number] DOB: 1941-09-19 Today's Date: 06/07/2017   History of Present Illness  Pt is a 76 y.o. male s/p Decompression L3-5, insitu fusion L3-5. PMHx: L TKA, R TSA, DM2.   Clinical Impression  Pt admitted with above diagnosis. Pt currently with functional limitations due to the deficits listed below (see PT Problem List). At the time of PT eval pt was able to perform transfers and ambulation with gross supervision for safety. No AD needed for balance however pt appeared slightly unsteady at the start of gait training. Improved with time/distance. Pt will benefit from skilled PT to increase their independence and safety with mobility to allow discharge to the venue listed below.       Follow Up Recommendations No PT follow up;Supervision for mobility/OOB    Equipment Recommendations       Recommendations for Other Services       Precautions / Restrictions Precautions Precautions: Fall;Back Precaution Booklet Issued: No Precaution Comments: Pt able to recall 2/3 back precautions. Reviewed all with pt. Required Braces or Orthoses: Spinal Brace Spinal Brace: Lumbar corset Restrictions Weight Bearing Restrictions: No      Mobility  Bed Mobility Overal bed mobility: Needs Assistance Bed Mobility: Rolling;Sidelying to Sit Rolling: Supervision Sidelying to sit: Supervision       General bed mobility comments: Use of rails required. No assist however increased time to complete safe transfer.   Transfers Overall transfer level: Needs assistance Equipment used: None Transfers: Sit to/from Stand Sit to Stand: Supervision         General transfer comment: for safety, no physical assist but required increased time  Ambulation/Gait Ambulation/Gait assistance: Supervision Ambulation Distance (Feet): 200 Feet Assistive device: None Gait Pattern/deviations: Step-through pattern;Decreased stride length;Trunk  flexed Gait velocity: Decreased Gait velocity interpretation: Below normal speed for age/gender General Gait Details: Close guard provided for safety. Pt was able to complete ~200 feet without complaints of fatigue.   Stairs Stairs: Yes Stairs assistance: Supervision Stair Management: One rail Left;Step to pattern;Forwards Number of Stairs: 10 General stair comments: VC's for sequencing and general safety  Wheelchair Mobility    Modified Rankin (Stroke Patients Only)       Balance Overall balance assessment: Needs assistance Sitting-balance support: Feet supported;No upper extremity supported Sitting balance-Leahy Scale: Good     Standing balance support: No upper extremity supported;During functional activity Standing balance-Leahy Scale: Good                               Pertinent Vitals/Pain Pain Assessment: Faces Faces Pain Scale: Hurts little more Pain Location: Back Pain Descriptors / Indicators: Operative site guarding;Discomfort Pain Intervention(s): Monitored during session    Home Living Family/patient expects to be discharged to:: Private residence Living Arrangements: Spouse/significant other Available Help at Discharge: Family;Available 24 hours/day Type of Home: House Home Access: Stairs to enter Entrance Stairs-Rails: Right;Left;Can reach both Entrance Stairs-Number of Steps: 3 Home Layout: One level Home Equipment: Walker - 2 wheels;Shower seat;Cane - quad;Cane - single point;Toilet riser;Grab bars - toilet      Prior Function Level of Independence: Independent               Hand Dominance   Dominant Hand: Right    Extremity/Trunk Assessment   Upper Extremity Assessment Upper Extremity Assessment: Overall WFL for tasks assessed    Lower Extremity Assessment Lower Extremity Assessment: Generalized weakness (Consistent with pre-op  diagnosis)    Cervical / Trunk Assessment Cervical / Trunk Assessment: Other  exceptions Cervical / Trunk Exceptions: s/p surgery  Communication   Communication: No difficulties  Cognition Arousal/Alertness: Awake/alert Behavior During Therapy: WFL for tasks assessed/performed Overall Cognitive Status: Within Functional Limits for tasks assessed                                        General Comments      Exercises     Assessment/Plan    PT Assessment Patient needs continued PT services  PT Problem List Decreased strength;Decreased range of motion;Decreased activity tolerance;Decreased balance;Decreased mobility;Decreased safety awareness;Decreased knowledge of use of DME;Decreased knowledge of precautions;Pain;Obesity       PT Treatment Interventions DME instruction;Gait training;Therapeutic activities;Stair training;Functional mobility training;Therapeutic exercise;Neuromuscular re-education;Patient/family education    PT Goals (Current goals can be found in the Care Plan section)  Acute Rehab PT Goals Patient Stated Goal: return home PT Goal Formulation: With patient Time For Goal Achievement: 06/14/17 Potential to Achieve Goals: Good    Frequency Min 5X/week   Barriers to discharge        Co-evaluation               AM-PAC PT "6 Clicks" Daily Activity  Outcome Measure Difficulty turning over in bed (including adjusting bedclothes, sheets and blankets)?: Total Difficulty moving from lying on back to sitting on the side of the bed? : Total Difficulty sitting down on and standing up from a chair with arms (e.g., wheelchair, bedside commode, etc,.)?: Total Help needed moving to and from a bed to chair (including a wheelchair)?: A Little Help needed walking in hospital room?: A Little Help needed climbing 3-5 steps with a railing? : A Little 6 Click Score: 12    End of Session Equipment Utilized During Treatment: Gait belt Activity Tolerance: Patient tolerated treatment well Patient left: Other (comment) (Sitting EOB  with OT present) Nurse Communication: Mobility status PT Visit Diagnosis: Unsteadiness on feet (R26.81);Pain;Other symptoms and signs involving the nervous system (R29.898) Pain - part of body:  (back)    Time: 7989-2119 PT Time Calculation (min) (ACUTE ONLY): 24 min   Charges:   PT Evaluation $PT Eval Moderate Complexity: 1 Mod PT Treatments $Gait Training: 8-22 mins   PT G Codes:        Rolinda Roan, PT, DPT Acute Rehabilitation Services Pager: [place] 06/07/2017, 10:16 AM

## 2017-06-07 NOTE — Progress Notes (Addendum)
    Subjective: 1 Day Post-Op Procedure(s) (LRB): Decompression L3-5, insitu fusion L3-5  (N/A) Patient reports pain as 3 on 0-10 scale.   Denies CP or SOB.  Pt has not voided on his own since foley was removed.  In/out cath 600 cc. Positive flatus. Drain output last night was 280cc Objective: Vital signs in last 24 hours: Temp:  [97.7 F (36.5 C)-98.8 F (37.1 C)] 98.8 F (37.1 C) (08/16 0400) Pulse Rate:  [54-80] 65 (08/16 0400) Resp:  [13-23] 18 (08/16 0400) BP: (135-160)/(59-76) 146/59 (08/16 0400) SpO2:  [96 %-99 %] 97 % (08/16 0400) Weight:  [141.6 kg (312 lb 2.7 oz)] 141.6 kg (312 lb 2.7 oz) (08/15 1123)  Intake/Output from previous day: 08/15 0701 - 08/16 0700 In: 1962 [P.O.:240; I.V.:2700; IV Piggyback:250] Out: 2297 [Urine:1200; Drains:320; Blood:150] Intake/Output this shift: No intake/output data recorded.  Labs: No results for input(s): HGB in the last 72 hours. No results for input(s): WBC, RBC, HCT, PLT in the last 72 hours. No results for input(s): NA, K, CL, CO2, BUN, CREATININE, GLUCOSE, CALCIUM in the last 72 hours. No results for input(s): LABPT, INR in the last 72 hours.  Physical Exam: Neurologically intact ABD soft Sensation intact distally Dorsiflexion/Plantar flexion intact Incision: scant drainage Compartment soft  Assessment/Plan: 1 Day Post-Op Procedure(s) (LRB): Decompression L3-5, insitu fusion L3-5  (N/A) Advance diet Up with therapy  Drain remains in place Flomax started Continue anx  [place], Darla Lesches for Dr. Melina Schools [facility] [phone] 06/07/2017, 7:30 AM  Rectal tone: normal Perianal sensation intact No radicular leg pain Patient improving No evidence of cauda equina syndrome Continue to monitor urine output  Flomax for retention

## 2017-06-07 NOTE — Anesthesia Postprocedure Evaluation (Signed)
Anesthesia Post Note  Patient: [name] [name]  Procedure(s) Performed: Procedure(s) (LRB): Decompression L3-5, insitu fusion L3-5  (N/A)     Patient location during evaluation: PACU Anesthesia Type: General Level of consciousness: awake and alert Pain management: pain level controlled Vital Signs Assessment: post-procedure vital signs reviewed and stable Respiratory status: spontaneous breathing, nonlabored ventilation, respiratory function stable and patient connected to nasal cannula oxygen Cardiovascular status: blood pressure returned to baseline and stable Postop Assessment: no signs of nausea or vomiting Anesthetic complications: no    Last Vitals:  Vitals:   06/07/17 0758 06/07/17 1334  BP: 134/61 (!) 152/60  Pulse: 65 73  Resp: 18 18  Temp: 36.8 C 36.8 C  SpO2: 97% 94%    Last Pain:  Vitals:   06/07/17 1350  TempSrc:   PainSc: 3                  Ozella Comins

## 2017-06-07 NOTE — Evaluation (Signed)
Occupational Therapy Evaluation and Discharge Patient Details Name: [name] [name] MRN: [id number] DOB: June 13, 1941 Today's Date: 06/07/2017    History of Present Illness Pt is a 76 y.o. male s/p Decompression L3-5, insitu fusion L3-5. PMHx: L TKA, R TSA, DM2.    Clinical Impression   Pt reports he was independent with ADL PTA. Currently pt supervision with ADL and functional mobility with the exception of min assist for LB ADL. All back, safety, and ADL education completed with pt. Pt planning to d/c home with 24/7 supervision from family. No further acute OT needs identified; signing off at this time. Please re-consult if needs change. Thank you for this referral.     Follow Up Recommendations  No OT follow up    Equipment Recommendations  None recommended by OT    Recommendations for Other Services       Precautions / Restrictions Precautions Precautions: Fall;Back Precaution Booklet Issued: No Precaution Comments: Pt able to recall 2/3 back precautions. Reviewed all with pt. Required Braces or Orthoses: Spinal Brace Spinal Brace: Lumbar corset Restrictions Weight Bearing Restrictions: No      Mobility Bed Mobility        General bed mobility comments: Pt sitting EOB with PT upon arrival  Transfers Overall transfer level: Needs assistance Equipment used: None Transfers: Sit to/from Stand Sit to Stand: Supervision         General transfer comment: for safety, no physical assist but required increased time    Balance Overall balance assessment: Needs assistance Sitting-balance support: Feet supported;No upper extremity supported Sitting balance-Leahy Scale: Good     Standing balance support: No upper extremity supported;During functional activity Standing balance-Leahy Scale: Good                             ADL either performed or assessed with clinical judgement   ADL Overall ADL's : Needs assistance/impaired Eating/Feeding: Set  up;Sitting   Grooming: Supervision/safety;Standing Grooming Details (indicate cue type and reason): Educated on use of 2 cups for oral care Upper Body Bathing: Set up;Sitting   Lower Body Bathing: Minimal assistance;Sit to/from stand   Upper Body Dressing : Set up;Sitting Upper Body Dressing Details (indicate cue type and reason): pt reports no difficulties donning back brace this AM Lower Body Dressing: Minimal assistance;Sit to/from stand Lower Body Dressing Details (indicate cue type and reason): Pt unable to cross foot over opposite knee. Family to assist as needed Toilet Transfer: Supervision/safety;Ambulation;Regular Toilet;Grab bars Toilet Transfer Details (indicate cue type and reason): performed transfer on low toilet to simulate home environment   Toileting - Clothing Manipulation Details (indicate cue type and reason): educated on no twisting for peri care and use of wet wipes Tub/ Shower Transfer: Supervision/safety;Walk-in shower;Ambulation;Shower Scientist, research (medical) Details (indicate cue type and reason): recommended use of shower chair for safety Functional mobility during ADLs: Supervision/safety General ADL Comments: Educated pt on maintaining back precautions during functional activities, keeping frequently used items at counter top height.     Vision         Perception     Praxis      Pertinent Vitals/Pain Pain Assessment: Faces Faces Pain Scale: Hurts little more Pain Location: Back Pain Descriptors / Indicators: Operative site guarding;Discomfort Pain Intervention(s): Limited activity within patient's tolerance;Monitored during session;Repositioned     Hand Dominance Right   Extremity/Trunk Assessment Upper Extremity Assessment Upper Extremity Assessment: Overall WFL for tasks assessed   Lower Extremity Assessment Lower  Extremity Assessment: Defer to PT evaluation   Cervical / Trunk Assessment Cervical / Trunk Assessment: Other  exceptions Cervical / Trunk Exceptions: s/p surgery   Communication Communication Communication: No difficulties   Cognition Arousal/Alertness: Awake/alert Behavior During Therapy: WFL for tasks assessed/performed Overall Cognitive Status: Within Functional Limits for tasks assessed                                     General Comments       Exercises     Shoulder Instructions      Home Living Family/patient expects to be discharged to:: Private residence Living Arrangements: Spouse/significant other Available Help at Discharge: Family;Available 24 hours/day Type of Home: House Home Access: Stairs to enter [employer] of Steps: 3 Entrance Stairs-Rails: Right;Left;Can reach both Home Layout: One level     Bathroom Shower/Tub: Occupational psychologist: Standard Bathroom Accessibility: Yes   Home Equipment: Environmental consultant - 2 wheels;Shower seat;Cane - quad;Cane - single point;Toilet riser;Grab bars - toilet          Prior Functioning/Environment Level of Independence: Independent                 OT Problem List:        OT Treatment/Interventions:      OT Goals(Current goals can be found in the care plan section) Acute Rehab OT Goals Patient Stated Goal: return home OT Goal Formulation: All assessment and education complete, DC therapy  OT Frequency:     Barriers to D/C:            Co-evaluation              AM-PAC PT "6 Clicks" Daily Activity     Outcome Measure Help from another person eating meals?: None Help from another person taking care of personal grooming?: A Little Help from another person toileting, which includes using toliet, bedpan, or urinal?: A Little Help from another person bathing (including washing, rinsing, drying)?: A Little Help from another person to put on and taking off regular upper body clothing?: A Little Help from another person to put on and taking off regular lower body clothing?: A  Little 6 Click Score: 19   End of Session Equipment Utilized During Treatment: Back brace Nurse Communication: Mobility status;Other (comment) (no equipment or f/u needs)  Activity Tolerance: Patient tolerated treatment well Patient left: in chair;with call bell/phone within reach;with nursing/sitter in room  OT Visit Diagnosis: Unsteadiness on feet (R26.81);Pain Pain - part of body:  (back)                Time: 9311-2162 OT Time Calculation (min): 10 min Charges:  OT General Charges $OT Visit: 1 Procedure OT Evaluation $OT Eval Moderate Complexity: 1 Procedure G-Codes:     Zyiah Withington A. Ulice Brilliant, M.S., OTR/L Pager: [place] 06/07/2017, 10:02 AM

## 2017-06-08 ENCOUNTER — Encounter (HOSPITAL_COMMUNITY): Payer: Self-pay | Admitting: *Deleted

## 2017-06-08 LAB — GLUCOSE, CAPILLARY
GLUCOSE-CAPILLARY: 153 mg/dL — AB (ref 65–99)
Glucose-Capillary: 141 mg/dL — ABNORMAL HIGH (ref 65–99)

## 2017-06-08 MED ORDER — TAMSULOSIN HCL 0.4 MG PO CAPS: 0.4000 mg | ORAL_CAPSULE | Freq: Every day | ORAL | 0 refills | Status: DC

## 2017-06-08 NOTE — Progress Notes (Signed)
Patient is discharged from room 3C11 at this time. Alert and in stable condition. IV site d/c'd and instructions read to patient and wife with understanding verbalized. Left unit via wheelchair with all belongings at side. 

## 2017-06-08 NOTE — Progress Notes (Signed)
Physical Therapy Treatment Patient Details Name: [name] [name] MRN: [id number] DOB: 1941-07-18 Today's Date: 06/08/2017    History of Present Illness Pt is a 76 y.o. male s/p Decompression L3-5, insitu fusion L3-5. PMHx: L TKA, R TSA, DM2.     PT Comments    Pt progressing towards physical therapy goals. Was able to perform transfers and ambulation with gross supervision for safety. Pt reaching for HHA for sidelying to sit transfer but feel he will be able to complete without assist at home. Will continue to follow and progress as able per POC.    Follow Up Recommendations  No PT follow up;Supervision for mobility/OOB     Equipment Recommendations       Recommendations for Other Services       Precautions / Restrictions Precautions Precautions: Fall;Back Precaution Booklet Issued: No Precaution Comments: Pt was cued for precautions throughout functional mobility.  Required Braces or Orthoses: Spinal Brace Spinal Brace: Lumbar corset Restrictions Weight Bearing Restrictions: No    Mobility  Bed Mobility Overal bed mobility: Needs Assistance Bed Mobility: Rolling;Sidelying to Sit Rolling: Supervision Sidelying to sit: Min assist       General bed mobility comments: HOB flat and rails lowered to simulate home environment. Pt was able to complete with min assist for shoulder elevation to full sitting position.   Transfers Overall transfer level: Needs assistance Equipment used: None Transfers: Sit to/from Stand Sit to Stand: Supervision         General transfer comment: for safety, no physical assist but required increased time  Ambulation/Gait Ambulation/Gait assistance: Supervision Ambulation Distance (Feet): 400 Feet Assistive device: None Gait Pattern/deviations: Step-through pattern;Decreased stride length;Trunk flexed Gait velocity: Decreased Gait velocity interpretation: Below normal speed for age/gender General Gait Details: Close guard provided for  safety. No obvious losses of balance noted.    Stairs         General stair comments: Pt declined stair training. Was at a supervision level last session.   Wheelchair Mobility    Modified Rankin (Stroke Patients Only)       Balance Overall balance assessment: Needs assistance Sitting-balance support: Feet supported;No upper extremity supported Sitting balance-Leahy Scale: Good     Standing balance support: No upper extremity supported;During functional activity Standing balance-Leahy Scale: Good                              Cognition Arousal/Alertness: Awake/alert Behavior During Therapy: WFL for tasks assessed/performed Overall Cognitive Status: Within Functional Limits for tasks assessed                                        Exercises      General Comments        Pertinent Vitals/Pain Pain Assessment: Faces Faces Pain Scale: Hurts little more Pain Location: Back Pain Descriptors / Indicators: Operative site guarding;Discomfort Pain Intervention(s): Limited activity within patient's tolerance;Monitored during session;Repositioned    Home Living                      Prior Function            PT Goals (current goals can now be found in the care plan section) Acute Rehab PT Goals Patient Stated Goal: return home PT Goal Formulation: With patient Time For Goal Achievement: 06/14/17 Potential to Achieve Goals: Good Progress  towards PT goals: Progressing toward goals    Frequency    Min 5X/week      PT Plan Current plan remains appropriate    Co-evaluation              AM-PAC PT "6 Clicks" Daily Activity  Outcome Measure  Difficulty turning over in bed (including adjusting bedclothes, sheets and blankets)?: A Little Difficulty moving from lying on back to sitting on the side of the bed? : A Little Difficulty sitting down on and standing up from a chair with arms (e.g., wheelchair, bedside commode,  etc,.)?: A Little Help needed moving to and from a bed to chair (including a wheelchair)?: A Little Help needed walking in hospital room?: A Little Help needed climbing 3-5 steps with a railing? : A Little 6 Click Score: 18    End of Session Equipment Utilized During Treatment: Gait belt Activity Tolerance: Patient tolerated treatment well Patient left: in chair;with call bell/phone within reach Nurse Communication: Mobility status PT Visit Diagnosis: Unsteadiness on feet (R26.81);Pain;Other symptoms and signs involving the nervous system (R29.898) Pain - part of body:  (back)     Time: 7622-6333 PT Time Calculation (min) (ACUTE ONLY): 16 min  Charges:  $Gait Training: 8-22 mins                    G Codes:       Rolinda Roan, PT, DPT Acute Rehabilitation Services Pager: [place] 06/08/2017, 10:01 AM

## 2017-06-08 NOTE — Progress Notes (Signed)
    Subjective: Procedure(s) (LRB): Decompression L3-5, insitu fusion L3-5  (N/A) 2 Days Post-Op  Patient reports pain as 2 on 0-10 scale.  Reports decreased leg pain reports incisional back pain   Positive void Negative bowel movement Positive flatus Negative chest pain or shortness of breath  Objective: Vital signs in last 24 hours: Temp:  [98 F (36.7 C)-98.9 F (37.2 C)] 98.1 F (36.7 C) (08/17 0340) Pulse Rate:  [65-76] 76 (08/17 0340) Resp:  [18-20] 18 (08/17 0340) BP: (104-158)/(42-66) 158/66 (08/17 0340) SpO2:  [93 %-100 %] 100 % (08/17 0340)  Intake/Output from previous day: 08/16 0701 - 08/17 0700 In: 340 [P.O.:240; IV Piggyback:100] Out: 1934 [Urine:1734; Drains:200]  Labs: No results for input(s): WBC, RBC, HCT, PLT in the last 72 hours. No results for input(s): NA, K, CL, CO2, BUN, CREATININE, GLUCOSE, CALCIUM in the last 72 hours. No results for input(s): LABPT, INR in the last 72 hours.  Physical Exam: Neurologically intact Sensation intact distally Intact pulses distally Incision: dressing C/D/I Compartment soft  Assessment/Plan: Patient stable  xrays n/a Continue mobilization with physical therapy Continue care  Advance diet Up with therapy  Voiding spontaneously Drain 100 cc over last 12hrs.  If output remains low will d/c drain this afternoon and d/c to home Continue flomax  Melina Schools, MD [place] [phone]

## 2017-06-14 NOTE — Discharge Summary (Signed)
Physician Discharge Summary  Patient ID: [name] [name] MRN: [id number] DOB/AGE: 1941/07/22 76 y.o.  Admit date: 06/06/2017 Discharge date: 06/08/17 Admission Diagnoses:  <principal problem not specified>  Discharge Diagnoses:  Active Problems:   Status post lumbar spine operation   Status post lumbar surgery   Past Medical History:  Diagnosis Date  . Arthritis   . Complication of anesthesia     had spinal with knee surgeries  -"heart rate too low" for general  . Detached retina, left    L eye normal vision, reports that he has had a retina procedure in a doctor's office at [facility]   . Diabetes mellitus without complication ([place])   . Dysrhythmia   . GERD (gastroesophageal reflux disease)    hx no problems now  . H/O echocardiogram 2014  . History of hiatal hernia    ?20 yrs ago  . Hyperopia 2016  . Hypertension   . Lumbar stenosis with neurogenic claudication   . MG, ocular (myasthenia gravis) ([place])    ? of L eye,   . Prediabetes   . Presbyopia OU 2016    Surgeries: Procedure(s): Decompression L3-5, insitu fusion L3-5  on 06/06/2017   Consultants (if any):   Discharged Condition: Improved  Hospital Course: ASIM GERSTEN is an 76 y.o. male who was admitted 06/06/2017 with a diagnosis of DDD Lumbar and went to the operating room on 06/06/2017 and underwent the above named procedures.  Post op day one pt was having urinary retention. In/out cath was done and flomax was started.  Pt also had a drain in place with out put of 280cc overnight.  Low level of pain controlled on oral medications. Post op day 2 pts pain was well controlled.  Pt was ambulating in hallway.  Pt voiding w/o difficulty.  Drain output continued to decrease.  Pt was released later in the day.  The pt was cleared by PT to go home.  He was given perioperative antibiotics:  Anti-infectives    Start     Dose/Rate Route Frequency Ordered Stop   06/07/17 1200  ceFAZolin (ANCEF) IVPB 2g/100 mL premix     2 g 200  mL/hr over 30 Minutes Intravenous Every 8 hours 06/07/17 0737 06/07/17 2139   06/06/17 2130  ceFAZolin (ANCEF) IVPB 2g/100 mL premix     2 g 200 mL/hr over 30 Minutes Intravenous Every 8 hours 06/06/17 1800 06/07/17 0436   06/06/17 1230  ceFAZolin (ANCEF) 3 g in dextrose 5 % 50 mL IVPB     3 g 130 mL/hr over 30 Minutes Intravenous To [facility]Stay [facility] 06/05/17 1136 06/06/17 1338    .  He was given sequential compression devices, early ambulation, and TED for DVT prophylaxis.  He benefited maximally from the hospital stay and there were no complications.    Recent vital signs:  Vitals:   06/08/17 0840 06/08/17 1248  BP: 139/71 132/68  Pulse: 71 69  Resp: 18 18  Temp: 98.7 F (37.1 C) 97.6 F (36.4 C)  SpO2: 95% 95%    Recent laboratory studies:  Lab Results  Component Value Date   HGB 13.6 05/31/2017   HGB 13.6 06/09/2016   HGB 13.9 12/21/2015   Lab Results  Component Value Date   WBC 7.6 05/31/2017   PLT 290 05/31/2017   Lab Results  Component Value Date   INR 0.96 09/04/2012   Lab Results  Component Value Date   NA 137 05/31/2017   K 3.9 05/31/2017  CL 101 05/31/2017   CO2 24 05/31/2017   BUN 10 05/31/2017   CREATININE 1.14 05/31/2017   GLUCOSE 112 (H) 05/31/2017    Discharge Medications:   Allergies as of 06/08/2017   No Known Allergies     Medication List    STOP taking these medications   acetaminophen 500 MG tablet Commonly known as:  TYLENOL     TAKE these medications   aspirin 81 MG tablet Take 81 mg by mouth daily.   Fish Oil 1200 MG Caps Take 1,200 mg by mouth daily.   Flaxseed Oil 1000 MG Caps Take 1,000 mg by mouth daily.   hydrochlorothiazide 25 MG tablet Commonly known as:  HYDRODIURIL Take 1 tablet (25 mg total) by mouth daily.   losartan 100 MG tablet Commonly known as:  COZAAR Take 1 tablet (100 mg total) by mouth daily.   Magnesium 100 MG Caps Take 100 mg by mouth daily.   metFORMIN 500 MG tablet Commonly known  as:  GLUCOPHAGE Take 1 tablet (500 mg total) by mouth 2 (two) times daily with a meal. For the first week take only 1 tablet daily What changed:  when to take this  additional instructions   methocarbamol 500 MG tablet Commonly known as:  ROBAXIN Take 1 tablet (500 mg total) by mouth 4 (four) times daily.   omeprazole 20 MG capsule Commonly known as:  PRILOSEC Take 1 capsule (20 mg total) by mouth daily. What changed:  when to take this  reasons to take this   ondansetron 4 MG disintegrating tablet Commonly known as:  ZOFRAN ODT Take 1 tablet (4 mg total) by mouth every 8 (eight) hours as needed for nausea or vomiting.   oxyCODONE-acetaminophen 10-325 MG tablet Commonly known as:  PERCOCET Take 1 tablet by mouth every 4 (four) hours as needed for pain.   potassium chloride 10 MEQ tablet Commonly known as:  KLOR-CON M10 Take 1 tablet (10 mEq total) by mouth daily.   simvastatin 80 MG tablet Commonly known as:  ZOCOR Take 0.5 tablets (40 mg total) by mouth daily.   tamsulosin 0.4 MG Caps capsule Commonly known as:  FLOMAX Take 1 capsule (0.4 mg total) by mouth daily.            Discharge Care Instructions        Start     Ordered   06/08/17 0000  tamsulosin (FLOMAX) 0.4 MG CAPS capsule  Daily     06/08/17 0758   06/07/17 0000  oxyCODONE-acetaminophen (PERCOCET) 10-325 MG tablet  Every 4 hours PRN     06/06/17 1657   06/06/17 0000  Incentive spirometry RT     06/06/17 1654   06/06/17 0000  methocarbamol (ROBAXIN) 500 MG tablet  4 times daily     06/06/17 1657   06/06/17 0000  ondansetron (ZOFRAN ODT) 4 MG disintegrating tablet  Every 8 hours PRN     06/06/17 1657      Diagnostic Studies: Dg Lumbar Spine 2-3 Views  Result Date: 06/06/2017 CLINICAL DATA:  Lumbar decompression L3-L5. EXAM: LUMBAR SPINE - 2-3 VIEW COMPARISON:  None. FINDINGS: Cross-table lateral image labeled #1 shows metallic probes posterior to L1-2 and the mid L4 vertebral body levels.  There is vacuum phenomenon with disc space narrowing at L4-5 and L5-S1. No fracture or spondylolisthesis. Cross-table lateral image labeled #2 shows metallic probe tip posterior to the L4-5 interspace. The cutting tool overlies the L5 spinous process. There is no fracture or spondylolisthesis. There  is disc space narrowing at L4-5 and L5-S1. IMPRESSION: On the final submitted cross-table lateral image, metallic probe tip is posterior to the L4-5 interspace. Cutting tool overlies the L5 spinous process. There is disc space narrowing at L4-5 and L5-S1. These results were called by telephone at the time of interpretation on 06/06/2017 at 2:14 pm to the circulating nurse in [place] room 4, who verbally acknowledged these results. Electronically Signed   By: Lowella Grip III M.D.   On: 06/06/2017 14:15    Disposition: 01-Home or Self Care Pt will present to clinic in 2 weeks Post op medication provided  Discharge Instructions    Incentive spirometry RT    Complete by:  As directed          Signed: Valinda Hoar 06/14/2017, 11:21 AM

## 2017-07-09 ENCOUNTER — Encounter: Payer: Self-pay | Admitting: Internal Medicine

## 2017-07-09 ENCOUNTER — Ambulatory Visit (INDEPENDENT_AMBULATORY_CARE_PROVIDER_SITE_OTHER): Payer: Medicare Other | Admitting: Internal Medicine

## 2017-07-09 VITALS — BP 132/78 | HR 70 | Temp 97.9°F | Resp 14 | Ht 72.0 in | Wt 301.2 lb

## 2017-07-09 DIAGNOSIS — E118 Type 2 diabetes mellitus with unspecified complications: Secondary | ICD-10-CM

## 2017-07-09 DIAGNOSIS — I10 Essential (primary) hypertension: Secondary | ICD-10-CM

## 2017-07-09 DIAGNOSIS — E119 Type 2 diabetes mellitus without complications: Secondary | ICD-10-CM | POA: Insufficient documentation

## 2017-07-09 LAB — COMPREHENSIVE METABOLIC PANEL
ALBUMIN: 4.3 g/dL (ref 3.5–5.2)
ALK PHOS: 74 U/L (ref 39–117)
ALT: 17 U/L (ref 0–53)
AST: 15 U/L (ref 0–37)
BUN: 13 mg/dL (ref 6–23)
CALCIUM: 9.6 mg/dL (ref 8.4–10.5)
CHLORIDE: 99 meq/L (ref 96–112)
CO2: 26 mEq/L (ref 19–32)
CREATININE: 1.04 mg/dL (ref 0.40–1.50)
GFR: 74 mL/min (ref >60.00)
Glucose, Bld: 97 mg/dL (ref 70–99)
Potassium: 3.9 mEq/L (ref 3.5–5.1)
SODIUM: 137 meq/L (ref 135–145)
TOTAL PROTEIN: 7.2 g/dL (ref 6.0–8.3)
Total Bilirubin: 0.5 mg/dL (ref 0.2–1.2)

## 2017-07-09 NOTE — Progress Notes (Signed)
Pre visit review using our clinic review tool, if applicable. No additional management support is needed unless otherwise documented below in the visit note. 

## 2017-07-09 NOTE — Progress Notes (Signed)
Subjective:    Patient ID: [name] [name], male    DOB: 08/27/1941, 76 y.o.   MRN: [id number]  DOS:  07/09/2017 Type of visit - description : rov Interval history:  In general feeling well. Had back surgery few weeks ago, things are going well. Immediately after the surgery had difficulty urinating, was prescribed Flomax. He also took pain medications for 2 weeks but then he is stopped ,  pain is well-controlled with Tylenol as needed. DM: On metformin, CBGs at the hospital where 130, 140. HTN: Good compliance of medication, reports normal ambulatory BPs   Review of Systems No dysuria, gross hematuria difficulty urinating No lower extremity paresthesias No bladder or bowel incontinence  Past Medical History:  Diagnosis Date  . Arthritis   . Complication of anesthesia     had spinal with knee surgeries  -"heart rate too low" for general  . Detached retina, left    L eye normal vision, reports that he has had a retina procedure in a doctor's office at [facility]   . Diabetes mellitus without complication ([place])   . Dysrhythmia   . GERD (gastroesophageal reflux disease)    hx no problems now  . H/O echocardiogram 2014  . History of hiatal hernia    ?20 yrs ago  . Hyperopia 2016  . Hypertension   . Lumbar stenosis with neurogenic claudication   . MG, ocular (myasthenia gravis) ([place])    ? of L eye,   . Prediabetes   . Presbyopia OU 2016    Past Surgical History:  Procedure Laterality Date  . HERNIA REPAIR     bilateral inguinal hernia  . HERNIA REPAIR     umbilical  . JOINT REPLACEMENT  2009   right knee  . LUMBAR LAMINECTOMY/DECOMPRESSION MICRODISCECTOMY N/A 06/06/2017   Procedure: Decompression L3-5, insitu fusion L3-5 ;  Surgeon: Melina Schools, MD;  Location: [place];  Service: Orthopedics;  Laterality: N/A;  . MIDDLE EAR SURGERY Right    ear -patched hole in ear drum  . TOTAL KNEE ARTHROPLASTY  09/10/2012   Procedure: TOTAL KNEE ARTHROPLASTY;  Surgeon: Tobi Bastos,  MD;  Location: [facility];  Service: Orthopedics;  Laterality: Left;  . TOTAL SHOULDER ARTHROPLASTY Right 06/22/2016   Procedure: RIGHT TOTAL SHOULDER ARTHROPLASTY;  Surgeon: [name], MD;  Location: [place];  Service: Orthopedics;  Laterality: Right;    Social History   Social History  . Marital status: Married    Spouse name: N/A  . Number of children: 4  . Years of education: N/A   Occupational History  . [employer]-- retired 2010    Social History Main Topics  . Smoking status: Former Smoker    Packs/day: 0.50    Years: 2.00    Types: Cigarettes    Quit date: 06/09/1966  . Smokeless tobacco: Never Used  . Alcohol use No     Comment: none  . Drug use: No  . Sexual activity: Yes    Partners: Female   Other Topics Concern  . Not on file   Social History Narrative   married, lives w/ second wife, 2 living children, lost 2 kids        Allergies as of 07/09/2017   No Known Allergies     Medication List       Accurate as of 07/09/17  4:53 PM. Always use your most recent med list.          aspirin 81 MG tablet Take 81  mg by mouth daily.   Flaxseed Oil 1000 MG Caps Take 1,000 mg by mouth daily.   hydrochlorothiazide 25 MG tablet Commonly known as:  HYDRODIURIL Take 1 tablet (25 mg total) by mouth daily.   losartan 100 MG tablet Commonly known as:  COZAAR Take 1 tablet (100 mg total) by mouth daily.   Magnesium 100 MG Caps Take 100 mg by mouth daily.   metFORMIN 500 MG tablet Commonly known as:  GLUCOPHAGE Take 1 tablet (500 mg total) by mouth 2 (two) times daily with a meal. For the first week take only 1 tablet daily   methocarbamol 500 MG tablet Commonly known as:  ROBAXIN Take 1 tablet (500 mg total) by mouth 4 (four) times daily.   omeprazole 20 MG capsule Commonly known as:  PRILOSEC Take 1 capsule (20 mg total) by mouth daily.   potassium chloride 10 MEQ tablet Commonly known as:  KLOR-CON M10 Take 1 tablet (10 mEq total) by mouth  daily.   simvastatin 80 MG tablet Commonly known as:  ZOCOR Take 0.5 tablets (40 mg total) by mouth daily.   tamsulosin 0.4 MG Caps capsule Commonly known as:  FLOMAX Take 1 capsule (0.4 mg total) by mouth daily.            Discharge Care Instructions        Start     Ordered   07/09/17 0000  Comp Met (CMET)     07/09/17 1157         Objective:   Physical Exam BP 132/78 (BP Location: Left Arm, Patient Position: Sitting, Cuff Size: Normal)   Pulse 70   Temp 97.9 F (36.6 C) (Oral)   Resp 14   Ht 6' (1.829 m)   Wt (!) 301 lb 4 oz (136.6 kg)   SpO2 97%   BMI 40.86 kg/m  General:   Well developed, well nourished . NAD.  HEENT:  Normocephalic . Face symmetric, atraumatic Lungs:  CTA B Normal respiratory effort, no intercostal retractions, no accessory muscle use. Heart: RRR,  no murmur.  DIABETIC FEET EXAM: No lower extremity edema Normal pedal pulses bilaterally Skin normal, nails normal, no calluses Pinprick examination of the feet normal. Neurologic:  alert & oriented X3.  Speech normal, gait appropriate for age and unassisted Psych--  Cognition and judgment appear intact.  Cooperative with normal attention span and concentration.  Behavior appropriate. No anxious or depressed appearing.      Assessment & Plan:   Assessment DM HTN Hyperlipidemia Palpitations, PVCs, h/o bradycardia,s/p. Dr Adin Hector day monitor, echo 2014: Normal LV, some LVH due to HTN Myasthenia gravis (ocular, Dr Tomi Likens) Morbid obesity  PLAN: DM: Due to a A1c of 7.0, he started metformin, subsequent A1c was 6.6. Check a CMP. Foot exam normal HTN: Seems well-controlled on HCTZ, losartan, potassium supplements. Back pain: s/p surgery, pain has decrease, currently not taking strong pain medications just Tylenol as needed. L UTS: Had difficulty urinating after back surgery, was Rx Flomax, I wonder if problem was related to pain medication. He is not on pain meds, rec to hold  Flomax and restart if he has problems again. RTC 3-4 months, CPX

## 2017-07-09 NOTE — Patient Instructions (Signed)
GO TO THE LAB : Get the blood work     GO TO THE FRONT DESK Schedule your next appointment for a   physical exam in 3-4 months

## 2017-07-09 NOTE — Assessment & Plan Note (Signed)
DM: Due to a A1c of 7.0, he started metformin, subsequent A1c was 6.6. Check a CMP. Foot exam normal HTN: Seems well-controlled on HCTZ, losartan, potassium supplements. Back pain: s/p surgery, pain has decrease, currently not taking strong pain medications just Tylenol as needed. L UTS: Had difficulty urinating after back surgery, was Rx Flomax, I wonder if problem was related to pain medication. He is not on pain meds, rec to hold Flomax and restart if he has problems again. RTC 3-4 months, CPX

## 2017-07-25 ENCOUNTER — Other Ambulatory Visit: Payer: Self-pay

## 2017-07-25 MED ORDER — METFORMIN HCL 500 MG PO TABS: ORAL_TABLET | ORAL | 0 refills | Status: DC

## 2017-07-31 DIAGNOSIS — M48062 Spinal stenosis, lumbar region with neurogenic claudication: Secondary | ICD-10-CM | POA: Diagnosis not present

## 2017-08-08 DIAGNOSIS — M48062 Spinal stenosis, lumbar region with neurogenic claudication: Secondary | ICD-10-CM | POA: Diagnosis not present

## 2017-08-10 DIAGNOSIS — H01131 Eczematous dermatitis of right upper eyelid: Secondary | ICD-10-CM | POA: Diagnosis not present

## 2017-08-10 DIAGNOSIS — H01134 Eczematous dermatitis of left upper eyelid: Secondary | ICD-10-CM | POA: Diagnosis not present

## 2017-08-10 DIAGNOSIS — E119 Type 2 diabetes mellitus without complications: Secondary | ICD-10-CM | POA: Diagnosis not present

## 2017-08-10 DIAGNOSIS — H02403 Unspecified ptosis of bilateral eyelids: Secondary | ICD-10-CM | POA: Diagnosis not present

## 2017-08-10 DIAGNOSIS — H2513 Age-related nuclear cataract, bilateral: Secondary | ICD-10-CM | POA: Diagnosis not present

## 2017-08-10 LAB — HM DIABETES EYE EXAM

## 2017-08-20 ENCOUNTER — Encounter: Payer: Self-pay | Admitting: Internal Medicine

## 2017-08-22 DIAGNOSIS — M48062 Spinal stenosis, lumbar region with neurogenic claudication: Secondary | ICD-10-CM | POA: Diagnosis not present

## 2017-08-29 DIAGNOSIS — M48062 Spinal stenosis, lumbar region with neurogenic claudication: Secondary | ICD-10-CM | POA: Diagnosis not present

## 2017-09-27 ENCOUNTER — Other Ambulatory Visit: Payer: Self-pay | Admitting: Internal Medicine

## 2017-10-03 ENCOUNTER — Encounter: Payer: Medicare Other | Admitting: Internal Medicine

## 2017-10-03 ENCOUNTER — Ambulatory Visit: Payer: Medicare Other | Admitting: *Deleted

## 2017-10-04 ENCOUNTER — Encounter: Payer: Self-pay | Admitting: Internal Medicine

## 2017-10-04 ENCOUNTER — Ambulatory Visit: Payer: Medicare Other | Admitting: Internal Medicine

## 2017-10-04 VITALS — BP 126/70 | HR 59 | Temp 98.1°F | Resp 14 | Ht 72.0 in | Wt 298.2 lb

## 2017-10-04 DIAGNOSIS — Z0000 Encounter for general adult medical examination without abnormal findings: Secondary | ICD-10-CM

## 2017-10-04 DIAGNOSIS — E785 Hyperlipidemia, unspecified: Secondary | ICD-10-CM | POA: Diagnosis not present

## 2017-10-04 DIAGNOSIS — E118 Type 2 diabetes mellitus with unspecified complications: Secondary | ICD-10-CM

## 2017-10-04 LAB — LIPID PANEL
Cholesterol: 150 mg/dL (ref 0–200)
HDL: 42.5 mg/dL (ref >39.00)
NonHDL: 107.22
Total CHOL/HDL Ratio: 4
Triglycerides: 316 mg/dL — ABNORMAL HIGH (ref 0.0–149.0)
VLDL: 63.2 mg/dL — ABNORMAL HIGH (ref 0.0–40.0)

## 2017-10-04 LAB — LDL CHOLESTEROL, DIRECT: LDL DIRECT: 86 mg/dL

## 2017-10-04 LAB — HEMOGLOBIN A1C: HEMOGLOBIN A1C: 6.7 % — AB (ref 4.6–6.5)

## 2017-10-04 NOTE — Assessment & Plan Note (Signed)
DM: Currently on metformin 500 mg once daily, 2-3 times a week takes it B.I.D. check a A1c. HTN: Seems well controlled with losartan, HCTZ, potassium.  Last BMP satisfactory Hyperlipidemia: On simvastatin, checking labs. Morbid obesity: Has lost a few pounds, trying to do better with diet and exercise. L UTS: See last visit, sxs resolved.  Not on Flomax anymore. RTC 6 months, R OV

## 2017-10-04 NOTE — Progress Notes (Signed)
Pre visit review using our clinic review tool, if applicable. No additional management support is needed unless otherwise documented below in the visit note. 

## 2017-10-04 NOTE — Progress Notes (Signed)
Subjective:    Patient ID: [name] [name], male    DOB: Jan 23, 1941, 76 y.o.   MRN: [id number]  DOS:  10/04/2017 Type of visit - description : cpx Interval history: In general feeling well's, no major concerns Not taking metformin B.I.D., mostly once a day  Wt Readings from Last 3 Encounters:  10/04/17 298 lb 4 oz (135.3 kg)  07/09/17 (!) 301 lb 4 oz (136.6 kg)  06/06/17 (!) 312 lb 2.7 oz (141.6 kg)     Review of Systems Had a dental infection, had extractions and using a plate.    Other than above, a 14 point review of systems is negative    Past Medical History:  Diagnosis Date  . Arthritis   . Complication of anesthesia     had spinal with knee surgeries  -"heart rate too low" for general  . Detached retina, left    L eye normal vision, reports that he has had a retina procedure in a doctor's office at [facility]   . Diabetes mellitus without complication ([place])   . Dysrhythmia   . GERD (gastroesophageal reflux disease)    hx no problems now  . H/O echocardiogram 2014  . History of hiatal hernia    ?20 yrs ago  . Hyperopia 2016  . Hypertension   . Lumbar stenosis with neurogenic claudication   . MG, ocular (myasthenia gravis) ([place])    ? of L eye,   . Prediabetes   . Presbyopia OU 2016    Past Surgical History:  Procedure Laterality Date  . HERNIA REPAIR     bilateral inguinal hernia  . HERNIA REPAIR     umbilical  . JOINT REPLACEMENT  2009   right knee  . LUMBAR LAMINECTOMY/DECOMPRESSION MICRODISCECTOMY N/A 06/06/2017   Procedure: Decompression L3-5, insitu fusion L3-5 ;  Surgeon: Melina Schools, MD;  Location: [place];  Service: Orthopedics;  Laterality: N/A;  . MIDDLE EAR SURGERY Right    ear -patched hole in ear drum  . TOTAL KNEE ARTHROPLASTY  09/10/2012   Procedure: TOTAL KNEE ARTHROPLASTY;  Surgeon: Tobi Bastos, MD;  [facility];  Service: Orthopedics;  Laterality: Left;  . TOTAL SHOULDER ARTHROPLASTY Right 06/22/2016   Procedure: RIGHT TOTAL  SHOULDER ARTHROPLASTY;  Surgeon: [name], MD;  Location: [place];  Service: Orthopedics;  Laterality: Right;    Social History   Socioeconomic History  . Marital status: Married    Spouse name: Not on file  . Number of children: 4  . Years of education: Not on file  . Highest education level: Not on file  Social Needs  . Financial resource strain: Not on file  . Food insecurity - worry: Not on file  . Food insecurity - inability: Not on file  . Transportation needs - medical: Not on file  . Transportation needs - non-medical: Not on file  Occupational History  . Occupation: Audiological scientist-- retired 2010  Tobacco Use  . Smoking status: Former Smoker    Packs/day: 0.50    Years: 2.00    Pack years: 1.00    Types: Cigarettes    Last attempt to quit: 06/09/1966    Years since quitting: 51.3  . Smokeless tobacco: Never Used  Substance and Sexual Activity  . Alcohol use: No    Alcohol/week: 0.0 oz    Comment: none  . Drug use: No  . Sexual activity: Yes    Partners: Female  Other Topics Concern  . Not on file  Social History Narrative   married, lives w/ second wife, 2 living children, lost 2 kids       Family History  Problem Relation Age of Onset  . Diabetes Brother   . Colon cancer Brother 82       dx in 2018  . Hypertension Sister   . Prostate cancer Neg Hx   . Stroke Neg Hx   . CAD Neg Hx      Allergies as of 10/04/2017   No Known Allergies     Medication List        Accurate as of 10/04/17  4:59 PM. Always use your most recent med list.          aspirin 81 MG tablet Take 81 mg by mouth daily.   Flaxseed Oil 1000 MG Caps Take 1,000 mg by mouth daily.   hydrochlorothiazide 25 MG tablet Commonly known as:  HYDRODIURIL Take 1 tablet (25 mg total) by mouth daily.   losartan 100 MG tablet Commonly known as:  COZAAR Take 1 tablet (100 mg total) by mouth daily.   Magnesium 100 MG Caps Take 100 mg by mouth daily.   metFORMIN 500 MG  tablet Commonly known as:  GLUCOPHAGE Take 1 bid with meals   omeprazole 20 MG capsule Commonly known as:  PRILOSEC Take 1 capsule (20 mg total) by mouth daily.   potassium chloride 10 MEQ tablet Commonly known as:  KLOR-CON M10 Take 1 tablet (10 mEq total) by mouth daily.   simvastatin 80 MG tablet Commonly known as:  ZOCOR Take 0.5 tablets (40 mg total) by mouth daily.          Objective:   Physical Exam BP 126/70 (BP Location: Left Arm, Patient Position: Sitting, Cuff Size: Normal)   Pulse (!) 59   Temp 98.1 F (36.7 C) (Oral)   Resp 14   Ht 6' (1.829 m)   Wt 298 lb 4 oz (135.3 kg)   SpO2 98%   BMI 40.45 kg/m  General:   Well developed, obese appearing. NAD.  Neck: No  thyromegaly  HEENT:  Normocephalic . Face symmetric, atraumatic Lungs:  CTA B Normal respiratory effort, no intercostal retractions, no accessory muscle use. Heart: RRR,  no murmur.  This pretibial edema bilaterally  Abdomen:  Not distended, soft, non-tender. No rebound or rigidity.   Skin: Exposed areas without rash. Not pale. Not jaundice Neurologic:  alert & oriented X3.  Speech normal, gait appropriate for age and unassisted Strength symmetric and appropriate for age.  Psych: Cognition and judgment appear intact.  Cooperative with normal attention span and concentration.  Behavior appropriate. No anxious or depressed appearing.     Assessment & Plan:   Assessment DM HTN Hyperlipidemia Morbid obesity  Palpitations, PVCs, h/o bradycardia,s/p. Dr Adin Hector day monitor, echo 2014: Normal LV, some LVH due to HTN Myasthenia gravis (ocular, Dr Tomi Likens) Morbid obesity  PLAN: DM: Currently on metformin 500 mg once daily, 2-3 times a week takes it B.I.D. check a A1c. HTN: Seems well controlled with losartan, HCTZ, potassium.  Last BMP satisfactory Hyperlipidemia: On simvastatin, checking labs. Morbid obesity: Has lost a few pounds, trying to do better with diet and exercise. L UTS:  See last visit, sxs resolved.  Not on Flomax anymore. RTC 6 months, R OV

## 2017-10-04 NOTE — Assessment & Plan Note (Addendum)
-   Td 09; pneumonia shot: 2010;  Prevnar 2015; Had a  zostavax 2012 per pt; shingrix discussed. 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

## 2017-10-04 NOTE — Patient Instructions (Signed)
GO TO THE LAB : Get the blood work     GO TO THE FRONT DESK Schedule your next appointment for a checkup in 6 months  Consider a Medicare wellness with one of our nurses by February 2019

## 2017-10-18 ENCOUNTER — Other Ambulatory Visit: Payer: Self-pay

## 2017-10-18 MED ORDER — METFORMIN HCL 500 MG PO TABS: 500.0000 mg | ORAL_TABLET | Freq: Two times a day (BID) | ORAL | 1 refills | Status: DC

## 2017-11-27 ENCOUNTER — Other Ambulatory Visit: Payer: Self-pay | Admitting: Internal Medicine

## 2017-12-04 NOTE — Progress Notes (Addendum)
Subjective:   [name] [name] is a 77 y.o. male who presents for Medicare Annual/Subsequent preventive examination. Travels around playing music at churches.   Review of Systems: No ROS.  Medicare Wellness Visit. Additional risk factors are reflected in the social history.    Sleep patterns: Takes Tylenol PM to sleep. Sleep varies.  Home Safety/Smoke Alarms: Feels safe in home. Smoke alarms in place.  Living environment; residence and Firearm Safety: 1 story home. Lives with wife and dog.  Seat Belt Safety/Bike Helmet: Wears seat belt.  Male:   CCS- pt reported last 09/06/15 with 3 yr recall     PSA-  Lab Results  Component Value Date   PSA 1.59 10/02/2016   PSA 2.57 02/02/2014   PSA 1.75 12/07/2011       Objective:    Vitals: There were no vitals taken for this visit.  There is no height or weight on file to calculate BMI.  Advanced Directives 06/08/2017 05/31/2017 12/06/2016 06/09/2016 06/25/2015 06/25/2015 09/10/2012  Does Patient Have a Medical Advance Directive? Yes Yes Yes Yes Yes No Patient has advance directive, copy not in chart  Type of Advance Directive Living will Living will Living will Living will;Healthcare Power of [place];Living will - [place];Living will  Does patient want to make changes to medical advance directive? No - Patient declined - - No - Patient declined No - Patient declined - -  Copy of [place] in Chart? - - - No - copy requested No - copy requested - Copy requested from family  Would patient like information on creating a medical advance directive? - - - - No - patient declined information No - patient declined information -  Pre-existing out of facility DNR order (yellow form or pink MOST form) - - - - - - No    Tobacco Social History   Tobacco Use  Smoking Status Former Smoker  . Packs/day: 0.50  . Years: 2.00  . Pack years: 1.00  . Types: Cigarettes  . Last attempt to  quit: 06/09/1966  . Years since quitting: 51.5  Smokeless Tobacco Never Used     Counseling given: Not Answered   Clinical Intake:                       Past Medical History:  Diagnosis Date  . Arthritis   . Complication of anesthesia     had spinal with knee surgeries  -"heart rate too low" for general  . Detached retina, left    L eye normal vision, reports that he has had a retina procedure in a doctor's office at [facility]   . Diabetes mellitus without complication ([place])   . Dysrhythmia   . GERD (gastroesophageal reflux disease)    hx no problems now  . H/O echocardiogram 2014  . History of hiatal hernia    ?20 yrs ago  . Hyperopia 2016  . Hypertension   . Lumbar stenosis with neurogenic claudication   . MG, ocular (myasthenia gravis) ([place])    ? of L eye,   . Prediabetes   . Presbyopia OU 2016   Past Surgical History:  Procedure Laterality Date  . HERNIA REPAIR     bilateral inguinal hernia  . HERNIA REPAIR     umbilical  . JOINT REPLACEMENT  2009   right knee  . LUMBAR LAMINECTOMY/DECOMPRESSION MICRODISCECTOMY N/A 06/06/2017   Procedure: Decompression L3-5, insitu fusion L3-5 ;  Surgeon: Melina Schools, MD;  Location: [facility];  Service: Orthopedics;  Laterality: N/A;  . MIDDLE EAR SURGERY Right    ear -patched hole in ear drum  . TOTAL KNEE ARTHROPLASTY  09/10/2012   Procedure: TOTAL KNEE ARTHROPLASTY;  Surgeon: Tobi Bastos, MD;  Location: [facility];  Service: Orthopedics;  Laterality: Left;  . TOTAL SHOULDER ARTHROPLASTY Right 06/22/2016   Procedure: RIGHT TOTAL SHOULDER ARTHROPLASTY;  Surgeon: [name], MD;  Location: [facility];  Service: Orthopedics;  Laterality: Right;   Family History  Problem Relation Age of Onset  . Diabetes Brother   . Colon cancer Brother 59       dx in 2018  . Hypertension Sister   . Prostate cancer Neg Hx   . Stroke Neg Hx   . CAD Neg Hx    Social History   Socioeconomic History  . Marital status: Married    Spouse  name: Not on file  . Number of children: 4  . Years of education: Not on file  . Highest education level: Not on file  Social Needs  . Financial resource strain: Not on file  . Food insecurity - worry: Not on file  . Food insecurity - inability: Not on file  . Transportation needs - medical: Not on file  . Transportation needs - non-medical: Not on file  Occupational History  . Occupation: Audiological scientist-- retired 2010  Tobacco Use  . Smoking status: Former Smoker    Packs/day: 0.50    Years: 2.00    Pack years: 1.00    Types: Cigarettes    Last attempt to quit: 06/09/1966    Years since quitting: 51.5  . Smokeless tobacco: Never Used  Substance and Sexual Activity  . Alcohol use: No    Alcohol/week: 0.0 oz    Comment: none  . Drug use: No  . Sexual activity: Yes    Partners: Female  Other Topics Concern  . Not on file  Social History Narrative   married, lives w/ second wife, 2 living children, lost 2 kids      Outpatient Encounter Medications as of 12/07/2017  Medication Sig  . aspirin 81 MG tablet Take 81 mg by mouth daily.  . Flaxseed, Linseed, (FLAXSEED OIL) 1000 MG CAPS Take 1,000 mg by mouth daily.  . hydrochlorothiazide (HYDRODIURIL) 25 MG tablet Take 1 tablet (25 mg total) by mouth daily.  Marland Kitchen losartan (COZAAR) 100 MG tablet Take 1 tablet (100 mg total) by mouth daily.  . Magnesium 100 MG CAPS Take 100 mg by mouth daily.  . metFORMIN (GLUCOPHAGE) 500 MG tablet Take 1 tablet (500 mg total) by mouth 2 (two) times daily with a meal.  . omeprazole (PRILOSEC) 20 MG capsule Take 1 capsule (20 mg total) by mouth daily. (Patient taking differently: Take 20 mg by mouth daily as needed. )  . potassium chloride (KLOR-CON M10) 10 MEQ tablet Take 1 tablet (10 mEq total) by mouth daily.  . simvastatin (ZOCOR) 80 MG tablet Take 0.5 tablets (40 mg total) by mouth daily.   No facility-administered encounter medications on file as of 12/07/2017.     Activities of Daily Living In  your present state of health, do you have any difficulty performing the following activities: 06/08/2017 05/31/2017  Hearing? N N  Vision? N N  Difficulty concentrating or making decisions? N N  Walking or climbing stairs? Y Y  Dressing or bathing? N N  Doing errands, shopping? N -  Conservation officer, nature and  eating ? - -  Some recent data might be hidden    Patient Care Team: Colon Branch, MD as PCP - General [name], [name], MD as Consulting Physician (Cardiology) [name], MD as Consulting Physician (Gastroenterology) [name], MD as Consulting Physician (Orthopedic Surgery) [name], MD as Consulting Physician (Physical Medicine and Rehabilitation)   Assessment:   This is a routine wellness examination for [place]. Physical assessment deferred to PCP.  Exercise Activities and Dietary recommendations   Diet (meal preparation, eat out, water intake, caffeinated beverages, dairy products, fruits and vegetables): in general, an "unhealthy" diet, on average, 2 meals per day   Goals    . Weight (lb) < 300 lb (136.1 kg)       Fall Risk Fall Risk  12/06/2016 05/26/2016 12/21/2015 10/13/2015 06/23/2015  Falls in the past year? Yes Yes No No No  Number falls in past yr: 1 1 - - -  Comment Slipped on ice during ice storm - - - -  Injury with Fall? No Yes - - -  Follow up Falls prevention discussed Falls evaluation completed;Falls prevention discussed - - -     Depression Screen PHQ 2/9 Scores 12/06/2016 05/26/2016 12/21/2015 06/23/2015  PHQ - 2 Score 0 0 0 0    Cognitive Function MMSE - Mini Mental State Exam 12/06/2016  Orientation to time 5  Orientation to Place 5  Registration 3  Attention/ Calculation 2  Recall 3  Language- name 2 objects 2  Language- repeat 1  Language- follow 3 step command 3  Language- read & follow direction 1  Write a sentence 1  Copy design 1  Total score 27        Immunization History  Administered Date(s) Administered  . H1N1 11/12/2008   . Influenza Split 07/17/2014  . Influenza Whole 08/04/2010  . Influenza,inj,Quad PF,6+ Mos 06/23/2015  . Influenza-Unspecified 07/23/2013, 07/07/2016, 07/20/2017  . Pneumococcal Conjugate-13 08/12/2014  . Pneumococcal Polysaccharide-23 11/12/2008  . Td 12/19/2007  . Zoster 10/24/2011   Screening Tests Health Maintenance  Topic Date Due  . TETANUS/TDAP  12/18/2017  . HEMOGLOBIN A1C  04/04/2018  . FOOT EXAM  07/09/2018  . OPHTHALMOLOGY EXAM  08/10/2018  . COLONOSCOPY  09/05/2018  . INFLUENZA VACCINE  Completed  . PNA vac Low Risk Adult  Completed      Plan:   Follow up with Dr.[name] as scheduled 04/04/18.  Continue to eat heart healthy diet (full of fruits, vegetables, whole grains, lean protein, water--limit salt, fat, and sugar intake) and increase physical activity as tolerated.  Continue doing brain stimulating activities (puzzles, reading, adult coloring books, staying active) to keep memory sharp.   I have personally reviewed and noted the following in the patient's chart:   . Medical and social history . Use of alcohol, tobacco or illicit drugs  . Current medications and supplements . Functional ability and status . Nutritional status . Physical activity . Advanced directives . List of other physicians . Hospitalizations, surgeries, and ER visits in previous 12 months . Vitals . Screenings to include cognitive, depression, and falls . Referrals and appointments  In addition, I have reviewed and discussed with patient certain preventive protocols, quality metrics, and best practice recommendations. A written personalized care plan for preventive services as well as general preventive health recommendations were provided to patient.     Naaman Plummer [place], [place]  12/04/2017  [name], MD

## 2017-12-05 ENCOUNTER — Ambulatory Visit: Payer: Medicare Other | Admitting: *Deleted

## 2017-12-07 ENCOUNTER — Encounter: Payer: Self-pay | Admitting: *Deleted

## 2017-12-07 ENCOUNTER — Ambulatory Visit (INDEPENDENT_AMBULATORY_CARE_PROVIDER_SITE_OTHER): Payer: Medicare Other | Admitting: *Deleted

## 2017-12-07 VITALS — BP 140/74 | HR 60 | Ht 72.0 in | Wt 302.6 lb

## 2017-12-07 DIAGNOSIS — Z0000 Encounter for general adult medical examination without abnormal findings: Secondary | ICD-10-CM

## 2017-12-07 NOTE — Patient Instructions (Signed)
Follow up with Dr.[name] as scheduled 04/04/18.  Continue to eat heart healthy diet (full of fruits, vegetables, whole grains, lean protein, water--limit salt, fat, and sugar intake) and increase physical activity as tolerated.  Continue doing brain stimulating activities (puzzles, reading, adult coloring books, staying active) to keep memory sharp.    [name] , Thank you for taking time to come for your Medicare Wellness Visit. I appreciate your ongoing commitment to your health goals. Please review the following plan we discussed and let me know if I can assist you in the future.   These are the goals we discussed: Goals    . Walk more.    . Weight (lb) < 300 lb (136.1 kg)       This is a list of the screening recommended for you and due dates:  Health Maintenance  Topic Date Due  . Tetanus Vaccine  12/18/2017  . Hemoglobin A1C  04/04/2018  . Complete foot exam   07/09/2018  . Eye exam for diabetics  08/10/2018  . Colon Cancer Screening  09/05/2018  . Flu Shot  Completed  . Pneumonia vaccines  Completed    DASH Eating Plan DASH stands for "Dietary Approaches to Stop Hypertension." The DASH eating plan is a healthy eating plan that has been shown to reduce high blood pressure (hypertension). It may also reduce your risk for type 2 diabetes, heart disease, and stroke. The DASH eating plan may also help with weight loss. What are tips for following this plan? General guidelines  Avoid eating more than 2,300 mg (milligrams) of salt (sodium) a day. If you have hypertension, you may need to reduce your sodium intake to 1,500 mg a day.  Limit alcohol intake to no more than 1 drink a day for nonpregnant women and 2 drinks a day for men. One drink equals 12 oz of beer, 5 oz of wine, or 1 oz of hard liquor.  Work with your health care provider to maintain a healthy body weight or to lose weight. Ask what an ideal weight is for you.  Get at least 30 minutes of exercise that causes your  heart to beat faster (aerobic exercise) most days of the week. Activities may include walking, swimming, or biking.  Work with your health care provider or diet and nutrition specialist (dietitian) to adjust your eating plan to your individual calorie needs. Reading food labels  Check food labels for the amount of sodium per serving. Choose foods with less than 5 percent of the Daily Value of sodium. Generally, foods with less than 300 mg of sodium per serving fit into this eating plan.  To find whole grains, look for the word "whole" as the first word in the ingredient list. Shopping  Buy products labeled as "low-sodium" or "no salt added."  Buy fresh foods. Avoid canned foods and premade or frozen meals. Cooking  Avoid adding salt when cooking. Use salt-free seasonings or herbs instead of table salt or sea salt. Check with your health care provider or pharmacist before using salt substitutes.  Do not fry foods. Cook foods using healthy methods such as baking, boiling, grilling, and broiling instead.  Cook with heart-healthy oils, such as olive, canola, soybean, or sunflower oil. Meal planning   Eat a balanced diet that includes: ? 5 or more servings of fruits and vegetables each day. At each meal, try to fill half of your plate with fruits and vegetables. ? Up to 6-8 servings of whole grains each day. ?  Less than 6 oz of lean meat, poultry, or fish each day. A 3-oz serving of meat is about the same size as a deck of cards. One egg equals 1 oz. ? 2 servings of low-fat dairy each day. ? A serving of nuts, seeds, or beans 5 times each week. ? Heart-healthy fats. Healthy fats called Omega-3 fatty acids are found in foods such as flaxseeds and coldwater fish, like sardines, salmon, and mackerel.  Limit how much you eat of the following: ? Canned or prepackaged foods. ? Food that is high in trans fat, such as fried foods. ? Food that is high in saturated fat, such as fatty  meat. ? Sweets, desserts, sugary drinks, and other foods with added sugar. ? Full-fat dairy products.  Do not salt foods before eating.  Try to eat at least 2 vegetarian meals each week.  Eat more home-cooked food and less restaurant, buffet, and fast food.  When eating at a restaurant, ask that your food be prepared with less salt or no salt, if possible. What foods are recommended? The items listed may not be a complete list. Talk with your dietitian about what dietary choices are best for you. Grains Whole-grain or whole-wheat bread. Whole-grain or whole-wheat pasta. Brown rice. [place]. Bulgur. Whole-grain and low-sodium cereals. Pita bread. Low-fat, low-sodium crackers. Whole-wheat flour tortillas. Vegetables Fresh or frozen vegetables (raw, steamed, roasted, or grilled). Low-sodium or reduced-sodium tomato and vegetable juice. Low-sodium or reduced-sodium tomato sauce and tomato paste. Low-sodium or reduced-sodium canned vegetables. Fruits All fresh, dried, or frozen fruit. Canned fruit in natural juice (without added sugar). Meat and other protein foods Skinless chicken or [place]. Ground chicken or [place]. Pork with fat trimmed off. Fish and seafood. Egg whites. Dried beans, peas, or lentils. Unsalted nuts, nut butters, and seeds. Unsalted canned beans. Lean cuts of beef with fat trimmed off. Low-sodium, lean deli meat. Dairy Low-fat (1%) or fat-free (skim) milk. Fat-free, low-fat, or reduced-fat cheeses. Nonfat, low-sodium ricotta or cottage cheese. Low-fat or nonfat yogurt. Low-fat, low-sodium cheese. Fats and oils Soft margarine without trans fats. Vegetable oil. Low-fat, reduced-fat, or light mayonnaise and salad dressings (reduced-sodium). Canola, safflower, olive, soybean, and sunflower oils. Avocado. Seasoning and other foods Herbs. Spices. Seasoning mixes without salt. Unsalted popcorn and pretzels. Fat-free sweets. What foods are not recommended? The items listed  may not be a complete list. Talk with your dietitian about what dietary choices are best for you. Grains Baked goods made with fat, such as croissants, muffins, or some breads. Dry pasta or rice meal packs. Vegetables Creamed or fried vegetables. Vegetables in a cheese sauce. Regular canned vegetables (not low-sodium or reduced-sodium). Regular canned tomato sauce and paste (not low-sodium or reduced-sodium). Regular tomato and vegetable juice (not low-sodium or reduced-sodium). Angie Fava. Olives. Fruits Canned fruit in a light or heavy syrup. Fried fruit. Fruit in cream or butter sauce. Meat and other protein foods Fatty cuts of meat. Ribs. Fried meat. Berniece Salines. Sausage. Bologna and other processed lunch meats. Salami. Fatback. Hotdogs. Bratwurst. Salted nuts and seeds. Canned beans with added salt. Canned or smoked fish. Whole eggs or egg yolks. Chicken or [place] with skin. Dairy Whole or 2% milk, cream, and half-and-half. Whole or full-fat cream cheese. Whole-fat or sweetened yogurt. Full-fat cheese. Nondairy creamers. Whipped toppings. Processed cheese and cheese spreads. Fats and oils Butter. Stick margarine. Lard. Shortening. Ghee. Bacon fat. Tropical oils, such as coconut, palm kernel, or palm oil. Seasoning and other foods Salted popcorn and pretzels. Onion salt,  garlic salt, seasoned salt, table salt, and sea salt. Worcestershire sauce. Tartar sauce. Barbecue sauce. Teriyaki sauce. Soy sauce, including reduced-sodium. Steak sauce. Canned and packaged gravies. Fish sauce. Oyster sauce. Cocktail sauce. Horseradish that you find on the shelf. Ketchup. Mustard. Meat flavorings and tenderizers. Bouillon cubes. Hot sauce and Tabasco sauce. Premade or packaged marinades. Premade or packaged taco seasonings. Relishes. Regular salad dressings. Where to find more information:  National Heart, Lung, and [place]: https://wilson-eaton.com/  American Heart Association: www.heart.org Summary  The DASH  eating plan is a healthy eating plan that has been shown to reduce high blood pressure (hypertension). It may also reduce your risk for type 2 diabetes, heart disease, and stroke.  With the DASH eating plan, you should limit salt (sodium) intake to 2,300 mg a day. If you have hypertension, you may need to reduce your sodium intake to 1,500 mg a day.  When on the DASH eating plan, aim to eat more fresh fruits and vegetables, whole grains, lean proteins, low-fat dairy, and heart-healthy fats.  Work with your health care provider or diet and nutrition specialist (dietitian) to adjust your eating plan to your individual calorie needs. This information is not intended to replace advice given to you by your health care provider. Make sure you discuss any questions you have with your health care provider. Document Released: 09/28/2011 Document Revised: 10/02/2016 Document Reviewed: 10/02/2016 Elsevier Interactive Patient Education  2018 [place] Maintenance, Male A healthy lifestyle and preventive care is important for your health and wellness. Ask your health care provider about what schedule of regular examinations is right for you. What should I know about weight and diet? Eat a Healthy Diet  Eat plenty of vegetables, fruits, whole grains, low-fat dairy products, and lean protein.  Do not eat a lot of foods high in solid fats, added sugars, or salt.  Maintain a Healthy Weight Regular exercise can help you achieve or maintain a healthy weight. You should:  Do at least 150 minutes of exercise each week. The exercise should increase your heart rate and make you sweat (moderate-intensity exercise).  Do strength-training exercises at least twice a week.  Watch Your Levels of Cholesterol and Blood Lipids  Have your blood tested for lipids and cholesterol every 5 years starting at 76 years of age. If you are at high risk for heart disease, you should start having your blood tested  when you are 76 years old. You may need to have your cholesterol levels checked more often if: ? Your lipid or cholesterol levels are high. ? You are older than 76 years of age. ? You are at high risk for heart disease.  What should I know about cancer screening? Many types of cancers can be detected early and may often be prevented. Lung Cancer  You should be screened every year for lung cancer if: ? You are a current smoker who has smoked for at least 30 years. ? You are a former smoker who has quit within the past 15 years.  Talk to your health care provider about your screening options, when you should start screening, and how often you should be screened.  Colorectal Cancer  Routine colorectal cancer screening usually begins at 76 years of age and should be repeated every 5-10 years until you are 76 years old. You may need to be screened more often if early forms of precancerous polyps or small growths are found. Your health care provider may recommend screening at an  earlier age if you have risk factors for colon cancer.  Your health care provider may recommend using home test kits to check for hidden blood in the stool.  A small camera at the end of a tube can be used to examine your colon (sigmoidoscopy or colonoscopy). This checks for the earliest forms of colorectal cancer.  Prostate and Testicular Cancer  Depending on your age and overall health, your health care provider may do certain tests to screen for prostate and testicular cancer.  Talk to your health care provider about any symptoms or concerns you have about testicular or prostate cancer.  Skin Cancer  Check your skin from head to toe regularly.  Tell your health care provider about any new moles or changes in moles, especially if: ? There is a change in a mole's size, shape, or color. ? You have a mole that is larger than a pencil eraser.  Always use sunscreen. Apply sunscreen liberally and repeat throughout  the day.  Protect yourself by wearing long sleeves, pants, a wide-brimmed hat, and sunglasses when outside.  What should I know about heart disease, diabetes, and high blood pressure?  If you are 62-39 years of age, have your blood pressure checked every 3-5 years. If you are 39 years of age or older, have your blood pressure checked every year. You should have your blood pressure measured twice-once when you are at a hospital or clinic, and once when you are not at a hospital or clinic. Record the average of the two measurements. To check your blood pressure when you are not at a hospital or clinic, you can use: ? An automated blood pressure machine at a pharmacy. ? A home blood pressure monitor.  Talk to your health care provider about your target blood pressure.  If you are between 2-53 years old, ask your health care provider if you should take aspirin to prevent heart disease.  Have regular diabetes screenings by checking your fasting blood sugar level. ? If you are at a normal weight and have a low risk for diabetes, have this test once every three years after the age of 79. ? If you are overweight and have a high risk for diabetes, consider being tested at a younger age or more often.  A one-time screening for abdominal aortic aneurysm (AAA) by ultrasound is recommended for men aged 35-75 years who are current or former smokers. What should I know about preventing infection? Hepatitis B If you have a higher risk for hepatitis B, you should be screened for this virus. Talk with your health care provider to find out if you are at risk for hepatitis B infection. Hepatitis C Blood testing is recommended for:  Everyone born from [birth year] through 1964.  Anyone with known risk factors for hepatitis C.  Sexually Transmitted Diseases (STDs)  You should be screened each year for STDs including gonorrhea and chlamydia if: ? You are sexually active and are younger than 76 years of age. ? You  are older than 76 years of age and your health care provider tells you that you are at risk for this type of infection. ? Your sexual activity has changed since you were last screened and you are at an increased risk for chlamydia or gonorrhea. Ask your health care provider if you are at risk.  Talk with your health care provider about whether you are at high risk of being infected with HIV. Your health care provider may recommend a prescription medicine  to help prevent HIV infection.  What else can I do?  Schedule regular health, dental, and eye exams.  Stay current with your vaccines (immunizations).  Do not use any tobacco products, such as cigarettes, chewing tobacco, and e-cigarettes. If you need help quitting, ask your health care provider.  Limit alcohol intake to no more than 2 drinks per day. One drink equals 12 ounces of beer, 5 ounces of wine, or 1 ounces of hard liquor.  Do not use street drugs.  Do not share needles.  Ask your health care provider for help if you need support or information about quitting drugs.  Tell your health care provider if you often feel depressed.  Tell your health care provider if you have ever been abused or do not feel safe at home. This information is not intended to replace advice given to you by your health care provider. Make sure you discuss any questions you have with your health care provider. Document Released: 04/06/2008 Document Revised: 06/07/2016 Document Reviewed: 07/13/2015 Elsevier Interactive Patient Education  [employer].

## 2018-01-15 DIAGNOSIS — M545 Low back pain: Secondary | ICD-10-CM | POA: Diagnosis not present

## 2018-01-28 DIAGNOSIS — L578 Other skin changes due to chronic exposure to nonionizing radiation: Secondary | ICD-10-CM | POA: Diagnosis not present

## 2018-01-28 DIAGNOSIS — L821 Other seborrheic keratosis: Secondary | ICD-10-CM | POA: Diagnosis not present

## 2018-01-28 DIAGNOSIS — L570 Actinic keratosis: Secondary | ICD-10-CM | POA: Diagnosis not present

## 2018-03-30 ENCOUNTER — Other Ambulatory Visit: Payer: Self-pay | Admitting: Internal Medicine

## 2018-04-04 ENCOUNTER — Encounter: Payer: Self-pay | Admitting: Internal Medicine

## 2018-04-04 ENCOUNTER — Ambulatory Visit (INDEPENDENT_AMBULATORY_CARE_PROVIDER_SITE_OTHER): Payer: Medicare Other | Admitting: Internal Medicine

## 2018-04-04 VITALS — BP 128/68 | HR 56 | Temp 97.5°F | Resp 16 | Ht 72.0 in | Wt 306.1 lb

## 2018-04-04 DIAGNOSIS — E118 Type 2 diabetes mellitus with unspecified complications: Secondary | ICD-10-CM | POA: Diagnosis not present

## 2018-04-04 DIAGNOSIS — Z23 Encounter for immunization: Secondary | ICD-10-CM | POA: Diagnosis not present

## 2018-04-04 DIAGNOSIS — I10 Essential (primary) hypertension: Secondary | ICD-10-CM | POA: Diagnosis not present

## 2018-04-04 LAB — BASIC METABOLIC PANEL
BUN: 12 mg/dL (ref 6–23)
CALCIUM: 9.3 mg/dL (ref 8.4–10.5)
CHLORIDE: 101 meq/L (ref 96–112)
CO2: 27 mEq/L (ref 19–32)
CREATININE: 1.07 mg/dL (ref 0.40–1.50)
GFR: 71.47 mL/min (ref >60.00)
Glucose, Bld: 121 mg/dL — ABNORMAL HIGH (ref 70–99)
Potassium: 4.2 mEq/L (ref 3.5–5.1)
Sodium: 137 mEq/L (ref 135–145)

## 2018-04-04 LAB — HEMOGLOBIN A1C: HEMOGLOBIN A1C: 6.7 % — AB (ref 4.6–6.5)

## 2018-04-04 LAB — MAGNESIUM: MAGNESIUM: 1.9 mg/dL (ref 1.5–2.5)

## 2018-04-04 NOTE — Progress Notes (Signed)
Pre visit review using our clinic review tool, if applicable. No additional management support is needed unless otherwise documented below in the visit note. 

## 2018-04-04 NOTE — Assessment & Plan Note (Signed)
DM: On metformin, occasionally skips the second dose of metformin if he does not have dinner.  Check a A1c. HTN: Seems controlled on losartan, HCTZ, potassium.  Check a BMP and magnesium per patient request.  He is not taking any Mg  supplements Morbid obesity: Talked to the patient about diet, exercise, portion control.  Still has back pain mostly when he walks, stationary bike?Marland Kitchen  Information about the wellness clinic provided. Preventive care: TD today RTC 09-2018 CPX

## 2018-04-04 NOTE — Progress Notes (Signed)
Subjective:    Patient ID: [name] [name], male    DOB: 1941/07/03, 77 y.o.   MRN: [id number]  DOS:  04/04/2018 Type of visit - description : rov Interval history: Routine follow-up, no major concerns. Diet and exercise have no change. Good med compliance although he skips the second dose of metformin now and then No ambulatory CBGs.  Wt Readings from Last 3 Encounters:  04/04/18 (!) 306 lb 2 oz (138.9 kg)  12/07/17 (!) 302 lb 9.6 oz (137.3 kg)  10/04/17 298 lb 4 oz (135.3 kg)     Review of Systems Denies chest pain, difficulty breathing or lower extremity edema. Concerned about his magnesium: Check levels?  Past Medical History:  Diagnosis Date  . Arthritis   . Complication of anesthesia     had spinal with knee surgeries  -"heart rate too low" for general  . Detached retina, left    L eye normal vision, reports that he has had a retina procedure in a doctor's office [facility]   . Diabetes mellitus without complication ([place])   . Dysrhythmia   . GERD (gastroesophageal reflux disease)    hx no problems now  . H/O echocardiogram 2014  . History of hiatal hernia    ?20 yrs ago  . Hyperopia 2016  . Hypertension   . Lumbar stenosis with neurogenic claudication   . MG, ocular (myasthenia gravis) ([place])    ? of L eye,   . Prediabetes   . Presbyopia OU 2016    Past Surgical History:  Procedure Laterality Date  . HERNIA REPAIR     bilateral inguinal hernia  . HERNIA REPAIR     umbilical  . JOINT REPLACEMENT  2009   right knee  . LUMBAR LAMINECTOMY/DECOMPRESSION MICRODISCECTOMY N/A 06/06/2017   Procedure: Decompression L3-5, insitu fusion L3-5 ;  Surgeon: Melina Schools, MD;  Location: [facility];  Service: Orthopedics;  Laterality: N/A;  . MIDDLE EAR SURGERY Right    ear -patched hole in ear drum  . TOTAL KNEE ARTHROPLASTY  09/10/2012   Procedure: TOTAL KNEE ARTHROPLASTY;  Surgeon: Tobi Bastos, MD;  Location: [facility];  Service: Orthopedics;  Laterality: Left;  . TOTAL  SHOULDER ARTHROPLASTY Right 06/22/2016   Procedure: RIGHT TOTAL SHOULDER ARTHROPLASTY;  Surgeon: [name], MD;  Location: [place];  Service: Orthopedics;  Laterality: Right;    Social History   Socioeconomic History  . Marital status: Married    Spouse name: Not on file  . Number of children: 4  . Years of education: Not on file  . Highest education level: Not on file  Occupational History  . Occupation: Audiological scientist-- retired 2010  Social Needs  . Financial resource strain: Not on file  . Food insecurity:    Worry: Not on file    Inability: Not on file  . Transportation needs:    Medical: Not on file    Non-medical: Not on file  Tobacco Use  . Smoking status: Former Smoker    Packs/day: 0.50    Years: 2.00    Pack years: 1.00    Types: Cigarettes    Last attempt to quit: 06/09/1966    Years since quitting: 51.8  . Smokeless tobacco: Never Used  Substance and Sexual Activity  . Alcohol use: No    Alcohol/week: 0.0 oz    Comment: none  . Drug use: No  . Sexual activity: Yes    Partners: Female  Lifestyle  . Physical activity:  Days per week: Not on file    Minutes per session: Not on file  . Stress: Not on file  Relationships  . Social connections:    Talks on phone: Not on file    Gets together: Not on file    Attends religious service: Not on file    Active member of club or organization: Not on file    Attends meetings of clubs or organizations: Not on file    Relationship status: Not on file  . Intimate partner violence:    Fear of current or ex partner: Not on file    Emotionally abused: Not on file    Physically abused: Not on file    Forced sexual activity: Not on file  Other Topics Concern  . Not on file  Social History Narrative   married, lives w/ second wife, 2 living children, lost 2 kids        Allergies as of 04/04/2018   No Known Allergies     Medication List        Accurate as of 04/04/18  1:07 PM. Always use your most recent med  list.          aspirin 81 MG tablet Take 81 mg by mouth daily.   Flaxseed Oil 1000 MG Caps Take 1,000 mg by mouth daily.   hydrochlorothiazide 25 MG tablet Commonly known as:  HYDRODIURIL Take 1 tablet (25 mg total) by mouth daily.   losartan 100 MG tablet Commonly known as:  COZAAR Take 1 tablet (100 mg total) by mouth daily.   metFORMIN 500 MG tablet Commonly known as:  GLUCOPHAGE Take 1 tablet (500 mg total) by mouth 2 (two) times daily with a meal.   omeprazole 20 MG capsule Commonly known as:  PRILOSEC Take 1 capsule (20 mg total) by mouth daily.   potassium chloride 10 MEQ tablet Commonly known as:  KLOR-CON M10 Take 1 tablet (10 mEq total) by mouth daily.   simvastatin 80 MG tablet Commonly known as:  ZOCOR TAKE 0.5 TABLETS (40 MG TOTAL) BY MOUTH DAILY.          Objective:   Physical Exam BP 128/68 (BP Location: Left Arm, Patient Position: Sitting, Cuff Size: Normal)   Pulse (!) 56   Temp (!) 97.5 F (36.4 C) (Oral)   Resp 16   Ht 6' (1.829 m)   Wt (!) 306 lb 2 oz (138.9 kg)   SpO2 96%   BMI 41.52 kg/m  General:   Well developed, NAD, see BMI.  HEENT:  Normocephalic . Face symmetric, atraumatic Lungs:  CTA B Normal respiratory effort, no intercostal retractions, no accessory muscle use. Heart: RRR,  no murmur.  No pretibial edema bilaterally  Skin: Not pale. Not jaundice Neurologic:  alert & oriented X3.  Speech normal, gait appropriate for age and unassisted Psych--  Cognition and judgment appear intact.  Cooperative with normal attention span and concentration.  Behavior appropriate. No anxious or depressed appearing.      Assessment & Plan:   Assessment DM HTN Hyperlipidemia Morbid obesity  Palpitations, PVCs, h/o bradycardia,s/p. Dr Adin Hector day monitor, echo 2014: Normal LV, some LVH due to HTN Myasthenia gravis (ocular, Dr Tomi Likens) Morbid obesity  PLAN: DM: On metformin, occasionally skips the second dose of metformin if  he does not have dinner.  Check a A1c. HTN: Seems controlled on losartan, HCTZ, potassium.  Check a BMP and magnesium per patient request.  He is not taking any Mg  supplements  Morbid obesity: Talked to the patient about diet, exercise, portion control.  Still has back pain mostly when he walks, stationary bike?Marland Kitchen  Information about the wellness clinic provided. Preventive care: TD today RTC 09-2018 CPX

## 2018-04-04 NOTE — Patient Instructions (Signed)
GO TO THE LAB : Get the blood work     GO TO THE FRONT DESK Schedule your next appointment for a physical exam by December 2019    

## 2018-04-15 ENCOUNTER — Other Ambulatory Visit: Payer: Self-pay | Admitting: Internal Medicine

## 2018-04-19 DIAGNOSIS — M545 Low back pain: Secondary | ICD-10-CM | POA: Diagnosis not present

## 2018-04-19 DIAGNOSIS — Z9889 Other specified postprocedural states: Secondary | ICD-10-CM | POA: Diagnosis not present

## 2018-05-24 ENCOUNTER — Other Ambulatory Visit: Payer: Self-pay | Admitting: Internal Medicine

## 2018-09-11 DIAGNOSIS — K634 Enteroptosis: Secondary | ICD-10-CM | POA: Diagnosis not present

## 2018-09-11 DIAGNOSIS — Z86010 Personal history of colonic polyps: Secondary | ICD-10-CM | POA: Diagnosis not present

## 2018-09-11 DIAGNOSIS — D123 Benign neoplasm of transverse colon: Secondary | ICD-10-CM | POA: Diagnosis not present

## 2018-09-11 DIAGNOSIS — K635 Polyp of colon: Secondary | ICD-10-CM | POA: Diagnosis not present

## 2018-09-11 DIAGNOSIS — D122 Benign neoplasm of ascending colon: Secondary | ICD-10-CM | POA: Diagnosis not present

## 2018-09-11 LAB — HM COLONOSCOPY

## 2018-09-13 DIAGNOSIS — D122 Benign neoplasm of ascending colon: Secondary | ICD-10-CM | POA: Diagnosis not present

## 2018-09-13 DIAGNOSIS — D123 Benign neoplasm of transverse colon: Secondary | ICD-10-CM | POA: Diagnosis not present

## 2018-09-13 DIAGNOSIS — K635 Polyp of colon: Secondary | ICD-10-CM | POA: Diagnosis not present

## 2018-10-01 ENCOUNTER — Encounter: Payer: Self-pay | Admitting: Internal Medicine

## 2018-10-03 ENCOUNTER — Encounter: Payer: Medicare Other | Admitting: Internal Medicine

## 2018-10-07 ENCOUNTER — Other Ambulatory Visit: Payer: Self-pay | Admitting: Internal Medicine

## 2018-10-11 ENCOUNTER — Encounter: Payer: Medicare Other | Admitting: Internal Medicine

## 2018-10-21 ENCOUNTER — Ambulatory Visit (INDEPENDENT_AMBULATORY_CARE_PROVIDER_SITE_OTHER): Payer: Medicare Other | Admitting: Internal Medicine

## 2018-10-21 ENCOUNTER — Encounter: Payer: Self-pay | Admitting: Internal Medicine

## 2018-10-21 VITALS — BP 136/78 | HR 78 | Temp 98.6°F | Resp 16 | Ht 72.0 in | Wt 311.5 lb

## 2018-10-21 DIAGNOSIS — E785 Hyperlipidemia, unspecified: Secondary | ICD-10-CM

## 2018-10-21 DIAGNOSIS — Z0000 Encounter for general adult medical examination without abnormal findings: Secondary | ICD-10-CM | POA: Diagnosis not present

## 2018-10-21 DIAGNOSIS — E1169 Type 2 diabetes mellitus with other specified complication: Secondary | ICD-10-CM

## 2018-10-21 MED ORDER — ZOSTER VAC RECOMB ADJUVANTED 50 MCG/0.5ML IM SUSR: 0.5000 mL | Freq: Once | INTRAMUSCULAR | 1 refills | Status: AC

## 2018-10-21 NOTE — Progress Notes (Signed)
Subjective:    Patient ID: [name] [name], male    DOB: 1941/09/15, 77 y.o.   MRN: [id number]  DOS:  10/21/2018 Type of visit - description: cpx In general feels well. Good compliance of medications   Review of Systems Has a number of skin lesions, 1 of them seems a slightly irritated located at the right arm.  Plan to see dermatology When asked, admits to snoring, denies any major problems with fatigue, occasionally gets sleepy. Back pain is currently controlled/at baseline.  Other than above, a 14 point review of systems is negative   Past Medical History:  Diagnosis Date  . Arthritis   . Complication of anesthesia     had spinal with knee surgeries  -"heart rate too low" for general  . Detached retina, left    L eye normal vision, reports that he has had a retina procedure in a doctor's office at [facility]   . Diabetes mellitus without complication ([place])   . Dysrhythmia   . GERD (gastroesophageal reflux disease)    hx no problems now  . H/O echocardiogram 2014  . History of hiatal hernia    ?20 yrs ago  . Hyperopia 2016  . Hypertension   . Lumbar stenosis with neurogenic claudication   . MG, ocular (myasthenia gravis) ([place])    ? of L eye,   . Prediabetes   . Presbyopia OU 2016    Past Surgical History:  Procedure Laterality Date  . HERNIA REPAIR     bilateral inguinal hernia  . HERNIA REPAIR     umbilical  . JOINT REPLACEMENT  2009   right knee  . LUMBAR LAMINECTOMY/DECOMPRESSION MICRODISCECTOMY N/A 06/06/2017   Procedure: Decompression L3-5, insitu fusion L3-5 ;  Surgeon: Melina Schools, MD;  Location: [place];  Service: Orthopedics;  Laterality: N/A;  . MIDDLE EAR SURGERY Right    ear -patched hole in ear drum  . TOTAL KNEE ARTHROPLASTY  09/10/2012   Procedure: TOTAL KNEE ARTHROPLASTY;  Surgeon: Tobi Bastos, MD;  Location: [facility];  Service: Orthopedics;  Laterality: Left;  . TOTAL SHOULDER ARTHROPLASTY Right 06/22/2016   Procedure: RIGHT TOTAL SHOULDER  ARTHROPLASTY;  Surgeon: [name], MD;  Location: [place];  Service: Orthopedics;  Laterality: Right;    Social History   Socioeconomic History  . Marital status: Married    Spouse name: Not on file  . Number of children: 4  . Years of education: Not on file  . Highest education level: Not on file  Occupational History  . Occupation: Audiological scientist-- retired 2010  Social Needs  . Financial resource strain: Not on file  . Food insecurity:    Worry: Not on file    Inability: Not on file  . Transportation needs:    Medical: Not on file    Non-medical: Not on file  Tobacco Use  . Smoking status: Former Smoker    Packs/day: 0.50    Years: 2.00    Pack years: 1.00    Types: Cigarettes    Last attempt to quit: 06/09/1966    Years since quitting: 52.4  . Smokeless tobacco: Never Used  Substance and Sexual Activity  . Alcohol use: No    Alcohol/week: 0.0 standard drinks    Comment: none  . Drug use: No  . Sexual activity: Yes    Partners: Female  Lifestyle  . Physical activity:    Days per week: Not on file    Minutes per session: Not on file  .  Stress: Not on file  Relationships  . Social connections:    Talks on phone: Not on file    Gets together: Not on file    Attends religious service: Not on file    Active member of club or organization: Not on file    Attends meetings of clubs or organizations: Not on file    Relationship status: Not on file  . Intimate partner violence:    Fear of current or ex partner: Not on file    Emotionally abused: Not on file    Physically abused: Not on file    Forced sexual activity: Not on file  Other Topics Concern  . Not on file  Social History Narrative   married, lives w/ second wife, 2 living children, lost 2 kids     Has a small dog   Family History  Problem Relation Age of Onset  . Diabetes Brother   . Colon cancer Brother 15       dx in 2018  . Hypertension Sister   . Prostate cancer Neg Hx   . Stroke Neg Hx   .  CAD Neg Hx      Allergies as of 10/21/2018   No Known Allergies     Medication List       Accurate as of October 21, 2018  5:52 PM. Always use your most recent med list.        aspirin 81 MG tablet Take 81 mg by mouth daily.   Flaxseed Oil 1000 MG Caps Take 1,000 mg by mouth daily.   hydrochlorothiazide 25 MG tablet Commonly known as:  HYDRODIURIL Take 1 tablet (25 mg total) by mouth daily.   losartan 100 MG tablet Commonly known as:  COZAAR Take 1 tablet (100 mg total) by mouth daily.   metFORMIN 500 MG tablet Commonly known as:  GLUCOPHAGE Take 1 tablet (500 mg total) by mouth 2 (two) times daily with a meal.   omeprazole 20 MG capsule Commonly known as:  PRILOSEC Take 1 capsule (20 mg total) by mouth daily.   potassium chloride 10 MEQ tablet Commonly known as:  KLOR-CON M10 Take 1 tablet (10 mEq total) by mouth daily.   simvastatin 80 MG tablet Commonly known as:  ZOCOR Take 0.5 tablets (40 mg total) by mouth daily.   Zoster Vaccine Adjuvanted injection Commonly known as:  SHINGRIX Inject 0.5 mLs into the muscle once for 1 dose.           Objective:   Physical Exam BP 136/78 (BP Location: Left Arm, Patient Position: Sitting, Cuff Size: Normal)   Pulse 78   Temp 98.6 F (37 C) (Oral)   Resp 16   Ht 6' (1.829 m)   Wt (!) 311 lb 8 oz (141.3 kg)   SpO2 98%   BMI 42.25 kg/m  General: Well developed, NAD, BMI noted Neck: No  thyromegaly  HEENT:  Normocephalic . Face symmetric, atraumatic Lungs:  CTA B Normal respiratory effort, no intercostal retractions, no accessory muscle use. Heart: RRR,  no murmur.  +/+++ pretibial edema bilaterally  Rectal: External abnormalities: none. Normal sphincter tone. No rectal masses or tenderness.  Brown stools Prostate: Prostate gland firm and smooth, no enlargement, nodularity, tenderness, mass, asymmetry or induration Abdomen:  Not distended, soft, non-tender. No rebound or rigidity.   Skin: Exposed  areas without rash. Not pale. Not jaundice Neurologic:  alert & oriented X3.  Speech normal, gait appropriate for age and unassisted Strength symmetric and  appropriate for age.  Psych: Cognition and judgment appear intact.  Cooperative with normal attention span and concentration.  Behavior appropriate. No anxious or depressed appearing.     Assessment     Assessment DM HTN Hyperlipidemia Morbid obesity Snoring  Palpitations, PVCs, h/o bradycardia,s/p. Dr Adin Hector day monitor, echo 2014: Normal LV, some LVH due to HTN Myasthenia gravis (ocular, Dr Tomi Likens)  h/o detached retina before   PLAN: DM: On metformin, checking labs HTN: Seems controlled, continue HCTZ, losartan and potassium.  Checking labs Hyperlipidemia: On simvastatin, labs. Snoring: feels well, rarely feels sleepy, [place] scale= zero RTC 6 months

## 2018-10-21 NOTE — Assessment & Plan Note (Addendum)
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  Counseled that the best I could.  Information about the wellness clinic provided.

## 2018-10-21 NOTE — Assessment & Plan Note (Signed)
DM: On metformin, checking labs HTN: Seems controlled, continue HCTZ, losartan and potassium.  Checking labs Hyperlipidemia: On simvastatin, labs. Snoring: feels well, rarely feels sleepy, Epwoth scale= zero RTC 6 months

## 2018-10-21 NOTE — Patient Instructions (Addendum)
Please schedule your Medicare Wellness visit with Glenard Haring anytime after 12/07/2018.   GO TO THE LAB : Get the blood work     GO TO THE FRONT DESK Schedule your next appointment for a  Check up in 6 months    Check the  blood pressure 2 or 3 times a month   Be sure your blood pressure is between 110/65 and  135/85. If it is consistently higher or lower, let me know

## 2018-10-21 NOTE — Progress Notes (Signed)
Pre visit review using our clinic review tool, if applicable. No additional management support is needed unless otherwise documented below in the visit note. 

## 2018-10-22 LAB — LDL CHOLESTEROL, DIRECT: Direct LDL: 96 mg/dL

## 2018-10-22 LAB — COMPREHENSIVE METABOLIC PANEL
ALT: 16 U/L (ref 0–53)
AST: 13 U/L (ref 0–37)
Albumin: 4.3 g/dL (ref 3.5–5.2)
Alkaline Phosphatase: 63 U/L (ref 39–117)
BILIRUBIN TOTAL: 0.5 mg/dL (ref 0.2–1.2)
BUN: 13 mg/dL (ref 6–23)
CHLORIDE: 98 meq/L (ref 96–112)
CO2: 28 meq/L (ref 19–32)
CREATININE: 1.08 mg/dL (ref 0.40–1.50)
Calcium: 9.3 mg/dL (ref 8.4–10.5)
GFR: 70.6 mL/min (ref >60.00)
GLUCOSE: 102 mg/dL — AB (ref 70–99)
Potassium: 4.2 mEq/L (ref 3.5–5.1)
Sodium: 137 mEq/L (ref 135–145)
Total Protein: 6.6 g/dL (ref 6.0–8.3)

## 2018-10-22 LAB — CBC WITH DIFFERENTIAL/PLATELET
Basophils Absolute: 0.1 10*3/uL (ref 0.0–0.1)
Basophils Relative: 1.3 % (ref 0.0–3.0)
Eosinophils Absolute: 0.3 10*3/uL (ref 0.0–0.7)
Eosinophils Relative: 4 % (ref 0.0–5.0)
HCT: 40.3 % (ref 39.0–52.0)
Hemoglobin: 13.8 g/dL (ref 13.0–17.0)
LYMPHS ABS: 1.7 10*3/uL (ref 0.7–4.0)
Lymphocytes Relative: 19.8 % (ref 12.0–46.0)
MCHC: 34.3 g/dL (ref 30.0–36.0)
MCV: 90.1 fl (ref 78.0–100.0)
Monocytes Absolute: 0.8 10*3/uL (ref 0.1–1.0)
Monocytes Relative: 8.7 % (ref 3.0–12.0)
NEUTROS ABS: 5.8 10*3/uL (ref 1.4–7.7)
NEUTROS PCT: 66.2 % (ref 43.0–77.0)
Platelets: 285 10*3/uL (ref 150.0–400.0)
RBC: 4.47 Mil/uL (ref 4.22–5.81)
RDW: 13.5 % (ref 11.5–15.5)
WBC: 8.7 10*3/uL (ref 4.0–10.5)

## 2018-10-22 LAB — LIPID PANEL
Cholesterol: 167 mg/dL (ref 0–200)
HDL: 40.2 mg/dL (ref >39.00)
NonHDL: 126.72
Total CHOL/HDL Ratio: 4
Triglycerides: 315 mg/dL — ABNORMAL HIGH (ref 0.0–149.0)
VLDL: 63 mg/dL — ABNORMAL HIGH (ref 0.0–40.0)

## 2018-10-22 LAB — TSH: TSH: 0.71 u[IU]/mL (ref 0.35–4.50)

## 2018-10-22 LAB — HEMOGLOBIN A1C: HEMOGLOBIN A1C: 6.7 % — AB (ref 4.6–6.5)

## 2018-10-22 LAB — PSA: PSA: 5.21 ng/mL — ABNORMAL HIGH (ref 0.10–4.00)

## 2018-10-31 DIAGNOSIS — L821 Other seborrheic keratosis: Secondary | ICD-10-CM | POA: Diagnosis not present

## 2018-10-31 DIAGNOSIS — L570 Actinic keratosis: Secondary | ICD-10-CM | POA: Diagnosis not present

## 2018-10-31 DIAGNOSIS — L820 Inflamed seborrheic keratosis: Secondary | ICD-10-CM | POA: Diagnosis not present

## 2018-10-31 DIAGNOSIS — L578 Other skin changes due to chronic exposure to nonionizing radiation: Secondary | ICD-10-CM | POA: Diagnosis not present

## 2018-11-20 ENCOUNTER — Other Ambulatory Visit: Payer: Self-pay | Admitting: Internal Medicine

## 2018-12-23 ENCOUNTER — Other Ambulatory Visit: Payer: Self-pay

## 2018-12-23 NOTE — Patient Outreach (Signed)
  [facility]) Care Management Chronic Special Needs Program   12/23/2018  Name: [name] [name], DOB: 11-01-1940  MRN: [id number]  The client was discussed in an interdisciplinary care team meeting.  The following issues were discussed:  Client's needs, Key risk triggers/risk stratification, Care Plan, Coordination of care and Issues/barriers to care  Participants present: Mahlon Gammon, RNCM; Peter Garter, RNCM; Thea Silversmith, RNCM  Recommendations/Plan: Diabetes program, send educational material; send advanced directive packet. Send copy of care plan to client and a copy of care plan to primary care provider.   Follow-up:  In 2-4 months.  Thea Silversmith, RN, MSN, [place] [phone]

## 2019-01-07 DIAGNOSIS — M545 Low back pain: Secondary | ICD-10-CM | POA: Diagnosis not present

## 2019-01-07 DIAGNOSIS — M25552 Pain in left hip: Secondary | ICD-10-CM | POA: Diagnosis not present

## 2019-02-05 DIAGNOSIS — Z96653 Presence of artificial knee joint, bilateral: Secondary | ICD-10-CM | POA: Diagnosis not present

## 2019-02-05 DIAGNOSIS — M79652 Pain in left thigh: Secondary | ICD-10-CM | POA: Diagnosis not present

## 2019-02-05 DIAGNOSIS — M7632 Iliotibial band syndrome, left leg: Secondary | ICD-10-CM | POA: Diagnosis not present

## 2019-02-05 DIAGNOSIS — Z471 Aftercare following joint replacement surgery: Secondary | ICD-10-CM | POA: Diagnosis not present

## 2019-03-07 ENCOUNTER — Ambulatory Visit: Payer: Self-pay

## 2019-03-07 DIAGNOSIS — M545 Low back pain: Secondary | ICD-10-CM | POA: Diagnosis not present

## 2019-03-07 DIAGNOSIS — Z6841 Body Mass Index (BMI) 40.0 and over, adult: Secondary | ICD-10-CM | POA: Diagnosis not present

## 2019-03-25 ENCOUNTER — Ambulatory Visit: Payer: Self-pay

## 2019-03-25 ENCOUNTER — Other Ambulatory Visit: Payer: Self-pay

## 2019-03-25 NOTE — Patient Outreach (Signed)
  [place] [facility]) Care Management Chronic Special Needs Program    03/25/2019  Name: [name], DOB: 13-May-1941  MRN: [id number]   [name] is enrolled in a chronic special needs plan. RNCM called to follow up and review individualized care plan. No answer. HIPAA compliant message left.  Plan: follow up call within 1-2 weeks.  Thea Silversmith, RN, MSN, [place] [phone]

## 2019-04-03 ENCOUNTER — Ambulatory Visit: Payer: Self-pay

## 2019-04-04 ENCOUNTER — Other Ambulatory Visit: Payer: Self-pay | Admitting: Internal Medicine

## 2019-04-10 ENCOUNTER — Other Ambulatory Visit: Payer: Self-pay

## 2019-04-10 NOTE — Patient Outreach (Signed)
  [facility]) Care Management Chronic Special Needs Program    04/10/2019  Name: [name] [name], DOB: October 01, 1941  MRN: [id number]   Mr. [name] is enrolled in a chronic special needs plan. RNCM called to follow up and review individualized care plan. No answer. HIPAA compliant message left. 2nd attempt.  Plan: follow up call within 1-2 weeks.  Thea Silversmith, RN, MSN, [place] [phone]

## 2019-04-10 NOTE — Patient Outreach (Signed)
  [facility]) Care Management Chronic Special Needs Program  04/10/2019  Name: [name] [name] DOB: Jul 02, 1941  MRN: [id number]  Mr. Harlem Bula is enrolled in a chronic special needs plan for Diabetes. Chronic Care Management Coordinator telephoned client to review health risk assessment and to develop individualized care plan.  Introduced the chronic care management program, importance of client participation, and taking their care plan to all provider appointments and inpatient facilities.    Subjective: client reports a history of diabetes and hypertension. He reports he does not have a glucometer or check his sugar because his blood sugars have not been that bad. And adds he has a follow up appointment with his primary care at the end of the week and he will discuss with his doctor then. Currently client reports he is taking metformin 500 mg daily. Client reports he does not have any questions or concerns at this time. He is taking his medication as scheduled and denies any issues.   Care Plan Reviewed:  Goals Addressed            This Visit's Progress   . COMPLETED:  Acknowledge receipt of Advanced Directive package       Reports received packet.    . COMPLETED: Advanced Care Planning complete by as directed by client within the next 6-9 months.       Reports he has an advanced directive and health care power of attorney completed.    . Client understands the importance of follow-up with providers by attending scheduled visits   On track   . Client will use Assistive Devices as needed and verbalize understanding of device use   On track    Follow up with provider to see if you need a glucometer.    . Client will verbalize knowledge of self management of Hypertension as evidences by BP reading of 140/90 or less; or as defined by provider   On track   . Increase physical activity   On track   . Maintain timely refills of diabetic medication as prescribed within the  year .   On track   . Obtain annual  Lipid Profile, LDL-C   On track   . Obtain Annual Eye (retinal)  Exam    On track   . Obtain Annual Foot Exam   On track   . Obtain annual screen for micro albuminuria (urine) , nephropathy (kidney problems)   On track   . Obtain Hemoglobin A1C at least 2 times per year   On track   . Reduce portion size   On track    Please review education provided.    . Visit Primary Care Provider or Endocrinologist at least 2 times per year    On track   . Walk more.   On track   . Weight (lb) < 300 lb (136.1 kg)       Please review Educational material provided:       Covid 19 precautions discussed. RNCM reinforced the 24 hour nurse advice line. RNCM encouraged client to contact health care concierge for benefit questions. Client encouraged to call RNCM as needed/Confirmed he has contact number.  Plan: follow up in 6 months.    Thea Silversmith, RN, MSN, [place] [phone]

## 2019-04-14 ENCOUNTER — Ambulatory Visit: Payer: Self-pay

## 2019-04-21 ENCOUNTER — Other Ambulatory Visit: Payer: Self-pay

## 2019-04-22 ENCOUNTER — Ambulatory Visit (INDEPENDENT_AMBULATORY_CARE_PROVIDER_SITE_OTHER): Payer: HMO | Admitting: Internal Medicine

## 2019-04-22 ENCOUNTER — Encounter: Payer: Self-pay | Admitting: Internal Medicine

## 2019-04-22 VITALS — BP 141/48 | HR 65 | Temp 98.2°F | Resp 16 | Ht 72.0 in | Wt 314.2 lb

## 2019-04-22 DIAGNOSIS — E1169 Type 2 diabetes mellitus with other specified complication: Secondary | ICD-10-CM

## 2019-04-22 DIAGNOSIS — I10 Essential (primary) hypertension: Secondary | ICD-10-CM

## 2019-04-22 DIAGNOSIS — M25552 Pain in left hip: Secondary | ICD-10-CM

## 2019-04-22 DIAGNOSIS — R972 Elevated prostate specific antigen [PSA]: Secondary | ICD-10-CM | POA: Diagnosis not present

## 2019-04-22 LAB — BASIC METABOLIC PANEL
BUN: 11 mg/dL (ref 6–23)
CO2: 30 mEq/L (ref 19–32)
Calcium: 8.9 mg/dL (ref 8.4–10.5)
Chloride: 99 mEq/L (ref 96–112)
Creatinine, Ser: 1.12 mg/dL (ref 0.40–1.50)
GFR: 63.61 mL/min (ref >60.00)
Glucose, Bld: 120 mg/dL — ABNORMAL HIGH (ref 70–99)
Potassium: 4.4 mEq/L (ref 3.5–5.1)
Sodium: 138 mEq/L (ref 135–145)

## 2019-04-22 LAB — HEMOGLOBIN A1C: Hgb A1c MFr Bld: 6.7 % — ABNORMAL HIGH (ref 4.6–6.5)

## 2019-04-22 LAB — PSA: PSA: 1.65 ng/mL (ref 0.10–4.00)

## 2019-04-22 NOTE — Progress Notes (Signed)
Subjective:    Patient ID: [name] [name], male    DOB: [birth year], 78 y.o.   MRN: [id number]  DOS:  04/22/2019 Type of visit - description: f/u In the last 4 months has developed left hip pain, saw ortho,  x-rays done.  Pain has not changed DM: Good med compliance, no ambulatory CBGs. HTN: Good med compliance, ambulatory BPs when checked normal.  BP today slightly elevated. Increased PSA, due to recheck labs.  Wt Readings from Last 3 Encounters:  04/22/19 (!) 314 lb 4 oz (142.5 kg)  10/21/18 (!) 311 lb 8 oz (141.3 kg)  04/04/18 (!) 306 lb 2 oz (138.9 kg)   BP Readings from Last 3 Encounters:  04/22/19 (!) 141/48  10/21/18 136/78  04/04/18 128/68    Review of Systems Denies fever chills No chest pain no difficulty breathing No dysuria, gross hematuria difficulty urinating. Weight gain noted, patient admits in the last 3 months due to quarantine he has not been eating healthy; additionally is not able to exercise much due to hip pain.  Past Medical History:  Diagnosis Date  . Arthritis   . Complication of anesthesia     had spinal with knee surgeries  -"heart rate too low" for general  . Detached retina, left    L eye normal vision, reports that he has had a retina procedure in a doctor's office at [facility]   . Diabetes mellitus without complication ([place])   . Dysrhythmia   . GERD (gastroesophageal reflux disease)    hx no problems now  . H/O echocardiogram 2014  . History of hiatal hernia    ?20 yrs ago  . Hyperopia 2016  . Hypertension   . Lumbar stenosis with neurogenic claudication   . MG, ocular (myasthenia gravis) ([place])    ? of L eye,   . Prediabetes   . Presbyopia OU 2016    Past Surgical History:  Procedure Laterality Date  . HERNIA REPAIR     bilateral inguinal hernia  . HERNIA REPAIR     umbilical  . JOINT REPLACEMENT  2009   right knee  . LUMBAR LAMINECTOMY/DECOMPRESSION MICRODISCECTOMY N/A 06/06/2017   Procedure: Decompression L3-5, insitu fusion L3-5  ;  Surgeon: Melina Schools, MD;  Location: [place];  Service: Orthopedics;  Laterality: N/A;  . MIDDLE EAR SURGERY Right    ear -patched hole in ear drum  . TOTAL KNEE ARTHROPLASTY  09/10/2012   Procedure: TOTAL KNEE ARTHROPLASTY;  Surgeon: Tobi Bastos, MD;  Location: [facility];  Service: Orthopedics;  Laterality: Left;  . TOTAL SHOULDER ARTHROPLASTY Right 06/22/2016   Procedure: RIGHT TOTAL SHOULDER ARTHROPLASTY;  Surgeon: [name], MD;  Location: [place];  Service: Orthopedics;  Laterality: Right;    Social History   Socioeconomic History  . Marital status: Married    Spouse name: Not on file  . Number of children: 4  . Years of education: Not on file  . Highest education level: Not on file  Occupational History  . Occupation: Audiological scientist-- retired 2010  Social Needs  . Financial resource strain: Not on file  . Food insecurity    Worry: Not on file    Inability: Not on file  . Transportation needs    Medical: Not on file    Non-medical: Not on file  Tobacco Use  . Smoking status: Former Smoker    Packs/day: 0.50    Years: 2.00    Pack years: 1.00    Types: Cigarettes  Quit date: 06/09/1966    Years since quitting: 52.9  . Smokeless tobacco: Never Used  Substance and Sexual Activity  . Alcohol use: No    Alcohol/week: 0.0 standard drinks    Comment: none  . Drug use: No  . Sexual activity: Yes    Partners: Female  Lifestyle  . Physical activity    Days per week: Not on file    Minutes per session: Not on file  . Stress: Not on file  Relationships  . Social Herbalist on phone: Not on file    Gets together: Not on file    Attends religious service: Not on file    Active member of club or organization: Not on file    Attends meetings of clubs or organizations: Not on file    Relationship status: Not on file  . Intimate partner violence    Fear of current or ex partner: Not on file    Emotionally abused: Not on file    Physically abused: Not on  file    Forced sexual activity: Not on file  Other Topics Concern  . Not on file  Social History Narrative   married, lives w/ second wife, 2 living children, lost 2 kids     Has a small dog      Allergies as of 04/22/2019   No Known Allergies     Medication List       Accurate as of April 22, 2019  9:20 AM. If you have any questions, ask your nurse or doctor.        aspirin 81 MG tablet Take 81 mg by mouth daily.   Fish Oil 500 MG Caps Take 1 capsule by mouth daily.   Flaxseed Oil 1000 MG Caps Take 1,000 mg by mouth daily.   hydrochlorothiazide 25 MG tablet Commonly known as: HYDRODIURIL Take 1 tablet (25 mg total) by mouth daily.   losartan 100 MG tablet Commonly known as: COZAAR Take 1 tablet (100 mg total) by mouth daily.   metFORMIN 500 MG tablet Commonly known as: GLUCOPHAGE Take 1 tablet (500 mg total) by mouth 2 (two) times daily with a meal.   omeprazole 20 MG capsule Commonly known as: PriLOSEC Take 1 capsule (20 mg total) by mouth daily. What changed:   when to take this  reasons to take this   potassium chloride 10 MEQ tablet Commonly known as: [place]-Con M10 Take 1 tablet (10 mEq total) by mouth daily.   simvastatin 80 MG tablet Commonly known as: ZOCOR Take 0.5 tablets (40 mg total) by mouth daily.           Objective:   Physical Exam BP (!) 141/48 (BP Location: Left Arm, Patient Position: Sitting, Cuff Size: Normal)   Pulse 65   Temp 98.2 F (36.8 C) (Oral)   Resp 16   Ht 6' (1.829 m)   Wt (!) 314 lb 4 oz (142.5 kg)   SpO2 99%   BMI 42.62 kg/m  General:   Well developed, NAD, BMI noted. HEENT:  Normocephalic . Face symmetric, atraumatic Lungs:  CTA B Normal respiratory effort, no intercostal retractions, no accessory muscle use. Heart: RRR,  no murmur.  Trace pretibial edema bilaterally  Skin: Not pale. Not jaundice DRE: Normal sphincter tone, brown stools, prostate gland is soft, not tender or nodular Neurologic:   alert & oriented X3.  Speech normal, gait limited by hip pain, uses a cane. Psych--  Cognition and judgment appear  intact.  Cooperative with normal attention span and concentration.  Behavior appropriate. No anxious or depressed appearing.      Assessment    Assessment DM HTN Hyperlipidemia Morbid obesity Snoring  Palpitations, PVCs, h/o bradycardia,s/p. Dr Adin Hector day monitor, echo 2014: Normal LV, some LVH due to HTN Myasthenia gravis (ocular, Dr Tomi Likens)  h/o detached retina before   PLAN: DM: Continue metformin, check A1c. HTN: Continue losartan, HCTZ, potassium.  BP is slightly elevated, recommend to monitor BPs at home. Increased PSA: Asymptomatic, DRE normal today, rechecking a PSA Morbid obesity: Unfortunately the patient continues to gain weight, the challenge is hip pain preventing him from exercising regularly & the quarantine which keep him at home; admits to overeating.  Counseled to the best of my ability. Preventive care: Recommend early flu shot this season Hip pain: Recommend to see Ortho again if the pain continue. RTC 4 months

## 2019-04-22 NOTE — Assessment & Plan Note (Signed)
DM: Continue metformin, check A1c. HTN: Continue losartan, HCTZ, potassium.  BP is slightly elevated, recommend to monitor BPs at home. Increased PSA: Asymptomatic, DRE normal today, rechecking a PSA Morbid obesity: Unfortunately the patient continues to gain weight, the challenge is hip pain preventing him from exercising regularly & the quarantine which keep him at home; admits to overeating.  Counseled to the best of my ability. Preventive care: Recommend early flu shot this season Hip pain: Recommend to see Ortho again if the pain continue. RTC 4 months

## 2019-04-22 NOTE — Progress Notes (Signed)
Pre visit review using our clinic review tool, if applicable. No additional management support is needed unless otherwise documented below in the visit note. 

## 2019-04-22 NOTE — Patient Instructions (Signed)
Get the blood work     [place] Schedule your next appointment   fpr a check up in 4 months    Check the  blood pressure weekly   Be sure your blood pressure is between 110/65 and  135/85. If it is consistently higher or lower, let me know

## 2019-05-15 ENCOUNTER — Other Ambulatory Visit: Payer: Self-pay | Admitting: Internal Medicine

## 2019-05-19 ENCOUNTER — Ambulatory Visit (INDEPENDENT_AMBULATORY_CARE_PROVIDER_SITE_OTHER): Payer: HMO | Admitting: Internal Medicine

## 2019-05-19 ENCOUNTER — Other Ambulatory Visit: Payer: Self-pay

## 2019-05-19 ENCOUNTER — Ambulatory Visit (HOSPITAL_BASED_OUTPATIENT_CLINIC_OR_DEPARTMENT_OTHER)
Admission: RE | Admit: 2019-05-19 | Discharge: 2019-05-19 | Disposition: A | Discharge status: Home / Self Care | Payer: HMO | Source: Ambulatory Visit | Attending: Internal Medicine | Admitting: Internal Medicine

## 2019-05-19 ENCOUNTER — Encounter: Payer: Self-pay | Admitting: Internal Medicine

## 2019-05-19 VITALS — BP 191/56 | HR 82 | Temp 98.6°F | Resp 20 | Ht 72.0 in | Wt 317.2 lb

## 2019-05-19 DIAGNOSIS — I872 Venous insufficiency (chronic) (peripheral): Secondary | ICD-10-CM

## 2019-05-19 DIAGNOSIS — M7989 Other specified soft tissue disorders: Secondary | ICD-10-CM

## 2019-05-19 DIAGNOSIS — L03119 Cellulitis of unspecified part of limb: Secondary | ICD-10-CM | POA: Diagnosis not present

## 2019-05-19 MED ORDER — CEPHALEXIN 500 MG PO CAPS: 500.0000 mg | ORAL_CAPSULE | Freq: Four times a day (QID) | ORAL | 0 refills | Status: DC

## 2019-05-19 NOTE — Patient Instructions (Addendum)
Go to the first floor and get the ultrasound on your right leg.  Leg elevation is very important  Take the antibiotic as prescribed, is called Keflex  Use a lotion over-the-counter called AVENO twice a  day  Also use over-the-counter hydrocortisone 1% cream twice a day  We will call you and schedule follow-up 1 weeks from today.  Call sooner if you have any problems

## 2019-05-19 NOTE — Progress Notes (Signed)
Subjective:    Patient ID: [name] [name], male    DOB: 1941-10-10, 78 y.o.   MRN: [id number]  DOS:  05/19/2019 Type of visit - description: Acute He has a long history of dry skin particularly at the medial aspect of the pretibial area distally. He has gradually noted over the last 3 weeks that the area is now tender has noticed some redness going upward.  BP is elevated today, no ambulatory BPs.   Wt Readings from Last 3 Encounters:  05/19/19 (!) 317 lb 4 oz (143.9 kg)  04/22/19 (!) 314 lb 4 oz (142.5 kg)  10/21/18 (!) 311 lb 8 oz (141.3 kg)   BP Readings from Last 3 Encounters:  05/19/19 (!) 191/56  04/22/19 (!) 141/48  10/21/18 136/78     Review of Systems  Denies any fever chills No chest pain no difficulty breathing No palpitations No nausea, vomiting, diarrhea No recent prolonged car trip.  Past Medical History:  Diagnosis Date  . Arthritis   . Complication of anesthesia     had spinal with knee surgeries  -"heart rate too low" for general  . Detached retina, left    L eye normal vision, reports that he has had a retina procedure in a doctor's office at [facility]   . Diabetes mellitus without complication ([place])   . Dysrhythmia   . GERD (gastroesophageal reflux disease)    hx no problems now  . H/O echocardiogram 2014  . History of hiatal hernia    ?20 yrs ago  . Hyperopia 2016  . Hypertension   . Lumbar stenosis with neurogenic claudication   . MG, ocular (myasthenia gravis) ([place])    ? of L eye,   . Prediabetes   . Presbyopia OU 2016    Past Surgical History:  Procedure Laterality Date  . HERNIA REPAIR     bilateral inguinal hernia  . HERNIA REPAIR     umbilical  . JOINT REPLACEMENT  2009   right knee  . LUMBAR LAMINECTOMY/DECOMPRESSION MICRODISCECTOMY N/A 06/06/2017   Procedure: Decompression L3-5, insitu fusion L3-5 ;  Surgeon: Melina Schools, MD;  Location: [place];  Service: Orthopedics;  Laterality: N/A;  . MIDDLE EAR SURGERY Right    ear -patched  hole in ear drum  . TOTAL KNEE ARTHROPLASTY  09/10/2012   Procedure: TOTAL KNEE ARTHROPLASTY;  Surgeon: Tobi Bastos, MD;  [facility];  Service: Orthopedics;  Laterality: Left;  . TOTAL SHOULDER ARTHROPLASTY Right 06/22/2016   Procedure: RIGHT TOTAL SHOULDER ARTHROPLASTY;  Surgeon: [name], MD;  Location: [place];  Service: Orthopedics;  Laterality: Right;    Social History   Socioeconomic History  . Marital status: Married    Spouse name: Not on file  . Number of children: 4  . Years of education: Not on file  . Highest education level: Not on file  Occupational History  . Occupation: Audiological scientist-- retired 2010  Social Needs  . Financial resource strain: Not on file  . Food insecurity    Worry: Not on file    Inability: Not on file  . Transportation needs    Medical: Not on file    Non-medical: Not on file  Tobacco Use  . Smoking status: Former Smoker    Packs/day: 0.50    Years: 2.00    Pack years: 1.00    Types: Cigarettes    Quit date: 06/09/1966    Years since quitting: 52.9  . Smokeless tobacco: Never Used  Substance  and Sexual Activity  . Alcohol use: No    Alcohol/week: 0.0 standard drinks    Comment: none  . Drug use: No  . Sexual activity: Yes    Partners: Female  Lifestyle  . Physical activity    Days per week: Not on file    Minutes per session: Not on file  . Stress: Not on file  Relationships  . Social Herbalist on phone: Not on file    Gets together: Not on file    Attends religious service: Not on file    Active member of club or organization: Not on file    Attends meetings of clubs or organizations: Not on file    Relationship status: Not on file  . Intimate partner violence    Fear of current or ex partner: Not on file    Emotionally abused: Not on file    Physically abused: Not on file    Forced sexual activity: Not on file  Other Topics Concern  . Not on file  Social History Narrative   married, lives w/  second wife, 2 living children, lost 2 kids     Has a small dog      Allergies as of 05/19/2019   No Known Allergies     Medication List       Accurate as of May 19, 2019  4:37 PM. If you have any questions, ask your nurse or doctor.        aspirin 81 MG tablet Take 81 mg by mouth daily.   Fish Oil 500 MG Caps Take 1 capsule by mouth daily.   Flaxseed Oil 1000 MG Caps Take 1,000 mg by mouth daily.   hydrochlorothiazide 25 MG tablet Commonly known as: HYDRODIURIL Take 1 tablet (25 mg total) by mouth daily.   losartan 100 MG tablet Commonly known as: COZAAR Take 1 tablet (100 mg total) by mouth daily.   metFORMIN 500 MG tablet Commonly known as: GLUCOPHAGE Take 1 tablet (500 mg total) by mouth 2 (two) times daily with a meal.   omeprazole 20 MG capsule Commonly known as: PriLOSEC Take 1 capsule (20 mg total) by mouth daily. What changed:   when to take this  reasons to take this   potassium chloride 10 MEQ tablet Commonly known as: Klor-Con M10 Take 1 tablet (10 mEq total) by mouth daily.   simvastatin 80 MG tablet Commonly known as: ZOCOR Take 0.5 tablets (40 mg total) by mouth daily.           Objective:   Physical Exam BP (!) 191/56 (BP Location: Left Wrist, Patient Position: Sitting, Cuff Size: Small)   Pulse 82   Temp 98.6 F (37 C) (Oral)   Resp 20   Ht 6' (1.829 m)   Wt (!) 317 lb 4 oz (143.9 kg)   SpO2 98%   BMI 43.03 kg/m  General:   Well developed, NAD, BMI noted. HEENT:  Normocephalic . Face symmetric, atraumatic Lungs:  CTA B Normal respiratory effort, no intercostal retractions, no accessory muscle use. Heart: RRR,  no murmur.  Lower extremities: Both calf tender to palpation, right calf is larger by 1 inch in diameter. Good pedal pulses bilaterally and good capillary refill. Skin: See pictures.  In a symmetric fashion, skin is atrophic distally near the malleolus, mild woody edema; at the pretibial areas the skin is warm  and slightly red.  No abscess that I can tell, no openings. Neurologic:  alert &  oriented X3.  Speech normal, gait appropriate for age and unassisted Psych--  Cognition and judgment appear intact.  Cooperative with normal attention span and concentration.  Behavior appropriate. No anxious or depressed appearing.           Assessment     Assessment DM HTN Hyperlipidemia Morbid obesity Snoring  Palpitations, PVCs, h/o bradycardia,s/p. Dr Adin Hector day monitor, echo 2014: Normal LV, some LVH due to HTN Myasthenia gravis (ocular, Dr Tomi Likens)  h/o detached retina before   PLAN: Stasis dermatitis, cellulitis bilaterally: will treat with leg elevation, Keflex, OTC Aveeno,  hydrocortisone topically twice a day.  Reassess next week. Right calf swelling: The right calf is  swollen, check ultrasound, rule out DVT. HTN: BP is quite elevated today, he is asymptomatic.  BPs are typically okay.  Recheck next week, continue same meds RTC 1 week.  Will call and schedule.

## 2019-05-19 NOTE — Progress Notes (Signed)
Pre visit review using our clinic review tool, if applicable. No additional management support is needed unless otherwise documented below in the visit note. 

## 2019-05-20 NOTE — Assessment & Plan Note (Signed)
Stasis dermatitis, cellulitis bilaterally: will treat with leg elevation, Keflex, OTC Aveeno,  hydrocortisone topically twice a day.  Reassess next week. Right calf swelling: The right calf is  swollen, check ultrasound, rule out DVT. HTN: BP is quite elevated today, he is asymptomatic.  BPs are typically okay.  Recheck next week, continue same meds RTC 1 week.  Will call and schedule.

## 2019-05-28 ENCOUNTER — Ambulatory Visit (INDEPENDENT_AMBULATORY_CARE_PROVIDER_SITE_OTHER): Payer: HMO | Admitting: Internal Medicine

## 2019-05-28 ENCOUNTER — Encounter: Payer: Self-pay | Admitting: Internal Medicine

## 2019-05-28 ENCOUNTER — Other Ambulatory Visit: Payer: Self-pay

## 2019-05-28 VITALS — BP 161/56 | HR 88 | Temp 98.1°F | Resp 16 | Ht 72.0 in | Wt 314.0 lb

## 2019-05-28 DIAGNOSIS — I10 Essential (primary) hypertension: Secondary | ICD-10-CM

## 2019-05-28 DIAGNOSIS — Z23 Encounter for immunization: Secondary | ICD-10-CM | POA: Diagnosis not present

## 2019-05-28 DIAGNOSIS — L03119 Cellulitis of unspecified part of limb: Secondary | ICD-10-CM

## 2019-05-28 MED ORDER — DOXYCYCLINE HYCLATE 100 MG PO TABS: 100.0000 mg | ORAL_TABLET | Freq: Two times a day (BID) | ORAL | 0 refills | Status: DC

## 2019-05-28 MED ORDER — CHLORTHALIDONE 25 MG PO TABS: 25.0000 mg | ORAL_TABLET | Freq: Every day | ORAL | 0 refills | Status: DC

## 2019-05-28 MED ORDER — KETOCONAZOLE 2 % EX CREA: 1.0000 "application " | TOPICAL_CREAM | Freq: Every day | CUTANEOUS | 0 refills | Status: DC

## 2019-05-28 NOTE — Progress Notes (Signed)
Subjective:    Patient ID: [name] [name], male    DOB: [birth year], 78 y.o.   MRN: [id number]  DOS:  05/28/2019 Type of visit - description: Follow-up Was seen last week with lower extremity edema, cellulitis, started antibiotics, ultrasound negative for DVT. Feels about the same.  Wt Readings from Last 3 Encounters:  05/28/19 (!) 314 lb (142.4 kg)  05/19/19 (!) 317 lb 4 oz (143.9 kg)  04/22/19 (!) 314 lb 4 oz (142.5 kg)      Review of Systems  No fever chills No diarrhea but had some nausea.  Past Medical History:  Diagnosis Date  . Arthritis   . Complication of anesthesia     had spinal with knee surgeries  -"heart rate too low" for general  . Detached retina, left    L eye normal vision, reports that he has had a retina procedure in a doctor's office at [facility]   . Diabetes mellitus without complication ([place])   . Dysrhythmia   . GERD (gastroesophageal reflux disease)    hx no problems now  . H/O echocardiogram 2014  . History of hiatal hernia    ?20 yrs ago  . Hyperopia 2016  . Hypertension   . Lumbar stenosis with neurogenic claudication   . MG, ocular (myasthenia gravis) ([place])    ? of L eye,   . Prediabetes   . Presbyopia OU 2016    Past Surgical History:  Procedure Laterality Date  . HERNIA REPAIR     bilateral inguinal hernia  . HERNIA REPAIR     umbilical  . JOINT REPLACEMENT  2009   right knee  . LUMBAR LAMINECTOMY/DECOMPRESSION MICRODISCECTOMY N/A 06/06/2017   Procedure: Decompression L3-5, insitu fusion L3-5 ;  Surgeon: Melina Schools, MD;  Location: [place];  Service: Orthopedics;  Laterality: N/A;  . MIDDLE EAR SURGERY Right    ear -patched hole in ear drum  . TOTAL KNEE ARTHROPLASTY  09/10/2012   Procedure: TOTAL KNEE ARTHROPLASTY;  Surgeon: Tobi Bastos, MD;  [facility];  Service: Orthopedics;  Laterality: Left;  . TOTAL SHOULDER ARTHROPLASTY Right 06/22/2016   Procedure: RIGHT TOTAL SHOULDER ARTHROPLASTY;  Surgeon: [name], MD;   Location: [place];  Service: Orthopedics;  Laterality: Right;    Social History   Socioeconomic History  . Marital status: Married    Spouse name: Not on file  . Number of children: 4  . Years of education: Not on file  . Highest education level: Not on file  Occupational History  . Occupation: Audiological scientist-- retired 2010  Social Needs  . Financial resource strain: Not on file  . Food insecurity    Worry: Not on file    Inability: Not on file  . Transportation needs    Medical: Not on file    Non-medical: Not on file  Tobacco Use  . Smoking status: Former Smoker    Packs/day: 0.50    Years: 2.00    Pack years: 1.00    Types: Cigarettes    Quit date: 06/09/1966    Years since quitting: 53.0  . Smokeless tobacco: Never Used  Substance and Sexual Activity  . Alcohol use: No    Alcohol/week: 0.0 standard drinks    Comment: none  . Drug use: No  . Sexual activity: Yes    Partners: Female  Lifestyle  . Physical activity    Days per week: Not on file    Minutes per session: Not on file  .  Stress: Not on file  Relationships  . Social Herbalist on phone: Not on file    Gets together: Not on file    Attends religious service: Not on file    Active member of club or organization: Not on file    Attends meetings of clubs or organizations: Not on file    Relationship status: Not on file  . Intimate partner violence    Fear of current or ex partner: Not on file    Emotionally abused: Not on file    Physically abused: Not on file    Forced sexual activity: Not on file  Other Topics Concern  . Not on file  Social History Narrative   married, lives w/ second wife, 2 living children, lost 2 kids     Has a small dog      Allergies as of 05/28/2019   No Known Allergies     Medication List       Accurate as of May 28, 2019 11:59 PM. If you have any questions, ask your nurse or doctor.        STOP taking these medications   cephALEXin 500 MG capsule  Commonly known as: [place] by: [name], MD   hydrochlorothiazide 25 MG tablet Commonly known as: HYDRODIURIL Stopped by: [name], MD     TAKE these medications   aspirin 81 MG tablet Take 81 mg by mouth daily.   chlorthalidone 25 MG tablet Commonly known as: HYGROTON Take 1 tablet (25 mg total) by mouth daily. Started by: [name], MD   doxycycline 100 MG tablet Commonly known as: VIBRA-TABS Take 1 tablet (100 mg total) by mouth 2 (two) times daily. Started by: [name], MD   Fish Oil 500 MG Caps Take 1 capsule by mouth daily.   Flaxseed Oil 1000 MG Caps Take 1,000 mg by mouth daily.   ketoconazole 2 % cream Commonly known as: NIZORAL Apply 1 application topically daily. Started by: [name], MD   losartan 100 MG tablet Commonly known as: COZAAR Take 1 tablet (100 mg total) by mouth daily.   metFORMIN 500 MG tablet Commonly known as: GLUCOPHAGE Take 1 tablet (500 mg total) by mouth 2 (two) times daily with a meal.   omeprazole 20 MG capsule Commonly known as: PriLOSEC Take 1 capsule (20 mg total) by mouth daily. What changed:   when to take this  reasons to take this   potassium chloride 10 MEQ tablet Commonly known as: Klor-Con M10 Take 1 tablet (10 mEq total) by mouth daily.   simvastatin 80 MG tablet Commonly known as: ZOCOR Take 0.5 tablets (40 mg total) by mouth daily.           Objective:   Physical Exam BP (!) 161/56 (BP Location: Left Arm, Patient Position: Sitting, Cuff Size: Normal)   Pulse 88   Temp 98.1 F (36.7 C) (Oral)   Resp 16   Ht 6' (1.829 m)   Wt (!) 314 lb (142.4 kg)   SpO2 100%   BMI 42.59 kg/m  General:   Well developed, NAD, BMI noted. HEENT:  Normocephalic . Face symmetric, atraumatic Lower extremities: See pictures from last visit, they look about the same.  Right calf is still slightly larger by about half inch in circumference.  Area of redness remains TTP. There is a single area between the toes  with some maceration on the left foot. Neurologic:  alert & oriented X3.  Speech  normal, gait appropriate for age and unassisted Psych--  Cognition and judgment appear intact.  Cooperative with normal attention span and concentration.  Behavior appropriate. No anxious or depressed appearing.      Assessment     Assessment DM HTN Hyperlipidemia Morbid obesity Snoring  Palpitations, PVCs, h/o bradycardia,s/p. Dr Adin Hector day monitor, echo 2014: Normal LV, some LVH due to HTN Myasthenia gravis (ocular, Dr Tomi Likens)  h/o detached retina before   PLAN: Restasis dermatitis, cellulitis: Status post Keflex, not much better, will recommend doxycycline for 1 week, leg elevation, work on edema, see next. HTN: Currently on losartan 100 mg, HCTZ 25 mg,KCl 10 mEq 1 tablet daily. Plan: Stop hydrochlorothiazide, start chlorthalidone 25 mg (#30, no RF sent).  BMP in 10 days Right calf edema: Ultrasound was negative Dermatitis between the toes: Likely fungal, prescribed Nizoral. Preventive care: PNM 23 booster today Follow-up: BMP in 10 days RTC 4 weeks

## 2019-05-28 NOTE — Patient Instructions (Addendum)
Per our records you are due for an eye exam. Please contact your eye doctor to schedule an appointment. Please have them send copies of your office visit notes to [place]. Our fax number is [phone].  GO TO THE FRONT DESK Schedule your next appointment   for a checkup in 4 weeks  Schedule labs to be done in 10 days, no need to be fasting  Stop hydrochlorothiazide Start chlorthalidone in the morning Start the new antibiotic called doxycycline for 10 days Other medications the same  Leg elevation Continue same creams  Check the  blood pressure every other day BP GOAL is between 110/65 and  135/85. If it is consistently higher or lower, let me know

## 2019-05-28 NOTE — Progress Notes (Signed)
Pre visit review using our clinic review tool, if applicable. No additional management support is needed unless otherwise documented below in the visit note. 

## 2019-05-29 NOTE — Assessment & Plan Note (Signed)
Restasis dermatitis, cellulitis: Status post Keflex, not much better, will recommend doxycycline for 1 week, leg elevation, work on edema, see next. HTN: Currently on losartan 100 mg, HCTZ 25 mg,KCl 10 mEq 1 tablet daily. Plan: Stop hydrochlorothiazide, start chlorthalidone 25 mg (#30, no RF sent).  BMP in 10 days Right calf edema: Ultrasound was negative Dermatitis between the toes: Likely fungal, prescribed Nizoral. Preventive care: PNM 23 booster today Follow-up: BMP in 10 days RTC 4 weeks

## 2019-06-04 ENCOUNTER — Ambulatory Visit: Payer: Self-pay | Admitting: Podiatry

## 2019-06-06 ENCOUNTER — Other Ambulatory Visit (INDEPENDENT_AMBULATORY_CARE_PROVIDER_SITE_OTHER): Payer: HMO

## 2019-06-06 ENCOUNTER — Other Ambulatory Visit: Payer: Self-pay

## 2019-06-06 DIAGNOSIS — I10 Essential (primary) hypertension: Secondary | ICD-10-CM

## 2019-06-06 NOTE — Addendum Note (Signed)
Addended by: Caffie Pinto on: 06/06/2019 03:28 PM   Modules accepted: Orders

## 2019-06-06 NOTE — Addendum Note (Signed)
Addended by: Caffie Pinto on: 06/06/2019 03:27 PM   Modules accepted: Orders

## 2019-06-07 LAB — BASIC METABOLIC PANEL
BUN/Creatinine Ratio: 17 (calc) (ref 6–22)
BUN: 24 mg/dL (ref 7–25)
CO2: 23 mmol/L (ref 20–32)
Calcium: 8.5 mg/dL — ABNORMAL LOW (ref 8.6–10.3)
Chloride: 94 mmol/L — ABNORMAL LOW (ref 98–110)
Creat: 1.41 mg/dL — ABNORMAL HIGH (ref 0.70–1.18)
Glucose, Bld: 129 mg/dL — ABNORMAL HIGH (ref 65–99)
Potassium: 4.1 mmol/L (ref 3.5–5.3)
Sodium: 129 mmol/L — ABNORMAL LOW (ref 135–146)

## 2019-06-10 NOTE — Addendum Note (Signed)
Addended byDamita Dunnings D on: 06/10/2019 01:02 PM   Modules accepted: Orders

## 2019-06-10 NOTE — Addendum Note (Signed)
Addended byDamita Dunnings D on: 06/10/2019 04:41 PM   Modules accepted: Orders

## 2019-06-12 LAB — HM DIABETES FOOT EXAM

## 2019-06-16 ENCOUNTER — Encounter (HOSPITAL_BASED_OUTPATIENT_CLINIC_OR_DEPARTMENT_OTHER): Payer: Self-pay | Admitting: *Deleted

## 2019-06-16 ENCOUNTER — Telehealth: Payer: Self-pay | Admitting: Internal Medicine

## 2019-06-16 ENCOUNTER — Other Ambulatory Visit: Payer: Self-pay

## 2019-06-16 ENCOUNTER — Emergency Department (HOSPITAL_BASED_OUTPATIENT_CLINIC_OR_DEPARTMENT_OTHER)
Admission: EM | Admit: 2019-06-16 | Discharge: 2019-06-16 | Disposition: A | Discharge status: Home / Self Care | Payer: HMO | Attending: Emergency Medicine | Admitting: Emergency Medicine

## 2019-06-16 ENCOUNTER — Ambulatory Visit (INDEPENDENT_AMBULATORY_CARE_PROVIDER_SITE_OTHER): Payer: HMO | Admitting: Internal Medicine

## 2019-06-16 ENCOUNTER — Emergency Department (HOSPITAL_BASED_OUTPATIENT_CLINIC_OR_DEPARTMENT_OTHER): Payer: HMO

## 2019-06-16 DIAGNOSIS — E119 Type 2 diabetes mellitus without complications: Secondary | ICD-10-CM | POA: Diagnosis not present

## 2019-06-16 DIAGNOSIS — Z7984 Long term (current) use of oral hypoglycemic drugs: Secondary | ICD-10-CM | POA: Diagnosis not present

## 2019-06-16 DIAGNOSIS — R21 Rash and other nonspecific skin eruption: Secondary | ICD-10-CM

## 2019-06-16 DIAGNOSIS — R0602 Shortness of breath: Secondary | ICD-10-CM | POA: Insufficient documentation

## 2019-06-16 DIAGNOSIS — I10 Essential (primary) hypertension: Secondary | ICD-10-CM | POA: Insufficient documentation

## 2019-06-16 DIAGNOSIS — Z7982 Long term (current) use of aspirin: Secondary | ICD-10-CM | POA: Insufficient documentation

## 2019-06-16 DIAGNOSIS — L03119 Cellulitis of unspecified part of limb: Secondary | ICD-10-CM | POA: Diagnosis not present

## 2019-06-16 DIAGNOSIS — Z79899 Other long term (current) drug therapy: Secondary | ICD-10-CM | POA: Insufficient documentation

## 2019-06-16 DIAGNOSIS — Z87891 Personal history of nicotine dependence: Secondary | ICD-10-CM | POA: Insufficient documentation

## 2019-06-16 LAB — PROTIME-INR
INR: 1.1 (ref 0.8–1.2)
Prothrombin Time: 13.8 seconds (ref 11.4–15.2)

## 2019-06-16 LAB — COMPREHENSIVE METABOLIC PANEL
ALT: 18 U/L (ref 0–44)
AST: 23 U/L (ref 15–41)
Albumin: 3.4 g/dL — ABNORMAL LOW (ref 3.5–5.0)
Alkaline Phosphatase: 81 U/L (ref 38–126)
Anion gap: 12 (ref 5–15)
BUN: 15 mg/dL (ref 8–23)
CO2: 27 mmol/L (ref 22–32)
Calcium: 8.6 mg/dL — ABNORMAL LOW (ref 8.9–10.3)
Chloride: 93 mmol/L — ABNORMAL LOW (ref 98–111)
Creatinine, Ser: 1.16 mg/dL (ref 0.61–1.24)
GFR calc Af Amer: >60 mL/min (ref >60)
GFR calc non Af Amer: >60 mL/min (ref >60)
Glucose, Bld: 112 mg/dL — ABNORMAL HIGH (ref 70–99)
Potassium: 3.7 mmol/L (ref 3.5–5.1)
Sodium: 132 mmol/L — ABNORMAL LOW (ref 135–145)
Total Bilirubin: 0.9 mg/dL (ref 0.3–1.2)
Total Protein: 6.7 g/dL (ref 6.5–8.1)

## 2019-06-16 LAB — CBC WITH DIFFERENTIAL/PLATELET
Abs Immature Granulocytes: 0.1 10*3/uL — ABNORMAL HIGH (ref 0.00–0.07)
Basophils Absolute: 0.2 10*3/uL — ABNORMAL HIGH (ref 0.0–0.1)
Basophils Relative: 2 %
Eosinophils Absolute: 1.1 10*3/uL — ABNORMAL HIGH (ref 0.0–0.5)
Eosinophils Relative: 11 %
HCT: 35.8 % — ABNORMAL LOW (ref 39.0–52.0)
Hemoglobin: 11.8 g/dL — ABNORMAL LOW (ref 13.0–17.0)
Immature Granulocytes: 1 %
Lymphocytes Relative: 13 %
Lymphs Abs: 1.2 10*3/uL (ref 0.7–4.0)
MCH: 29.5 pg (ref 26.0–34.0)
MCHC: 33 g/dL (ref 30.0–36.0)
MCV: 89.5 fL (ref 80.0–100.0)
Monocytes Absolute: 1.2 10*3/uL — ABNORMAL HIGH (ref 0.1–1.0)
Monocytes Relative: 13 %
Neutro Abs: 5.6 10*3/uL (ref 1.7–7.7)
Neutrophils Relative %: 60 %
Platelets: 118 10*3/uL — ABNORMAL LOW (ref 150–400)
RBC: 4 MIL/uL — ABNORMAL LOW (ref 4.22–5.81)
RDW: 14.4 % (ref 11.5–15.5)
Smear Review: DECREASED
WBC: 9.4 10*3/uL (ref 4.0–10.5)
nRBC: 0 % (ref 0.0–0.2)

## 2019-06-16 MED ORDER — PREDNISONE 20 MG PO TABS: 40.0000 mg | ORAL_TABLET | Freq: Every day | ORAL | 0 refills | Status: DC

## 2019-06-16 NOTE — ED Notes (Signed)
Patient transported to X-ray 

## 2019-06-16 NOTE — Telephone Encounter (Signed)
Okay to schedule for today (virtual).

## 2019-06-16 NOTE — Telephone Encounter (Signed)
Attempted to reach the office, three times. Patient's daughter is calling to request her father has a sooner appt.  He is having an upset stomach, and a rash. Daughter is open to a virutal appt.  Please advise [name] [phone]

## 2019-06-16 NOTE — Progress Notes (Signed)
Subjective:    Patient ID: [name] [name], male    DOB: Mar 08, 1941, 78 y.o.   MRN: [id number]  DOS:  06/16/2019 Type of visit - description: Virtual Visit via Video Note  I connected with@   by a video enabled telemedicine application and verified that I am speaking with the correct person using two identifiers.   THIS ENCOUNTER IS A VIRTUAL VISIT DUE TO COVID-19 - PATIENT WAS NOT SEEN IN THE OFFICE. PATIENT HAS CONSENTED TO VIRTUAL VISIT / TELEMEDICINE VISIT   Location of patient: home  Location of provider: office  I discussed the limitations of evaluation and management by telemedicine and the availability of in person appointments. The patient expressed understanding and agreed to proceed.  History of Present Illness: Acute. This is video visit assisted by the patient's daughter. Multiple concerns. Develop a generalized, itchy rash since he was switch to doxycycline 05/28/2019.  The rash is still there.  Cellulitis: Not improving  Has developed consistent nausea since he took doxycycline.  Review of Systems  Reports fever 2 nights ago.  No chills Some shortness of breath, reports palpitations ("my heart rate is 88, is never as high") No vomiting, no diarrhea.  No blood in the stools. Some shortness of breath but no chest pain  Past Medical History:  Diagnosis Date  . Arthritis   . Complication of anesthesia     had spinal with knee surgeries  -"heart rate too low" for general  . Detached retina, left    L eye normal vision, reports that he has had a retina procedure in a doctor's office at [facility]   . Diabetes mellitus without complication ([place])   . Dysrhythmia   . GERD (gastroesophageal reflux disease)    hx no problems now  . H/O echocardiogram 2014  . History of hiatal hernia    ?20 yrs ago  . Hyperopia 2016  . Hypertension   . Lumbar stenosis with neurogenic claudication   . MG, ocular (myasthenia gravis) ([place])    ? of L eye,   . Prediabetes   . Presbyopia OU  2016    Past Surgical History:  Procedure Laterality Date  . HERNIA REPAIR     bilateral inguinal hernia  . HERNIA REPAIR     umbilical  . JOINT REPLACEMENT  2009   right knee  . LUMBAR LAMINECTOMY/DECOMPRESSION MICRODISCECTOMY N/A 06/06/2017   Procedure: Decompression L3-5, insitu fusion L3-5 ;  Surgeon: Melina Schools, MD;  Location: [place];  Service: Orthopedics;  Laterality: N/A;  . MIDDLE EAR SURGERY Right    ear -patched hole in ear drum  . TOTAL KNEE ARTHROPLASTY  09/10/2012   Procedure: TOTAL KNEE ARTHROPLASTY;  Surgeon: Tobi Bastos, MD;  [facility];  Service: Orthopedics;  Laterality: Left;  . TOTAL SHOULDER ARTHROPLASTY Right 06/22/2016   Procedure: RIGHT TOTAL SHOULDER ARTHROPLASTY;  Surgeon: [name], MD;  Location: [place];  Service: Orthopedics;  Laterality: Right;    Social History   Socioeconomic History  . Marital status: Married    Spouse name: Not on file  . Number of children: 4  . Years of education: Not on file  . Highest education level: Not on file  Occupational History  . Occupation: Audiological scientist-- retired 2010  Social Needs  . Financial resource strain: Not on file  . Food insecurity    Worry: Not on file    Inability: Not on file  . Transportation needs    Medical: Not on file  Non-medical: Not on file  Tobacco Use  . Smoking status: Former Smoker    Packs/day: 0.50    Years: 2.00    Pack years: 1.00    Types: Cigarettes    Quit date: 06/09/1966    Years since quitting: 53.0  . Smokeless tobacco: Never Used  Substance and Sexual Activity  . Alcohol use: No    Alcohol/week: 0.0 standard drinks    Comment: none  . Drug use: No  . Sexual activity: Yes    Partners: Female  Lifestyle  . Physical activity    Days per week: Not on file    Minutes per session: Not on file  . Stress: Not on file  Relationships  . Social Herbalist on phone: Not on file    Gets together: Not on file    Attends religious service:  Not on file    Active member of club or organization: Not on file    Attends meetings of clubs or organizations: Not on file    Relationship status: Not on file  . Intimate partner violence    Fear of current or ex partner: Not on file    Emotionally abused: Not on file    Physically abused: Not on file    Forced sexual activity: Not on file  Other Topics Concern  . Not on file  Social History Narrative   married, lives w/ second wife, 2 living children, lost 2 kids     Has a small dog      Allergies as of 06/16/2019   No Known Allergies     Medication List       Accurate as of June 16, 2019  3:45 PM. If you have any questions, ask your nurse or doctor.        aspirin 81 MG tablet Take 81 mg by mouth daily.   chlorthalidone 25 MG tablet Commonly known as: HYGROTON Take 0.5 tablets (12.5 mg total) by mouth daily.   doxycycline 100 MG tablet Commonly known as: VIBRA-TABS Take 1 tablet (100 mg total) by mouth 2 (two) times daily.   Fish Oil 500 MG Caps Take 1 capsule by mouth daily.   Flaxseed Oil 1000 MG Caps Take 1,000 mg by mouth daily.   ketoconazole 2 % cream Commonly known as: NIZORAL Apply 1 application topically daily.   losartan 100 MG tablet Commonly known as: COZAAR Take 1 tablet (100 mg total) by mouth daily.   metFORMIN 500 MG tablet Commonly known as: GLUCOPHAGE Take 1 tablet (500 mg total) by mouth 2 (two) times daily with a meal.   omeprazole 20 MG capsule Commonly known as: PriLOSEC Take 1 capsule (20 mg total) by mouth daily. What changed:   when to take this  reasons to take this   potassium chloride 10 MEQ tablet Commonly known as: Klor-Con M10 Take 1 tablet (10 mEq total) by mouth daily.   simvastatin 80 MG tablet Commonly known as: ZOCOR Take 0.5 tablets (40 mg total) by mouth daily.           Objective:   Physical Exam There were no vitals taken for this visit. This is a virtual video visit, he is alert oriented,  in no distress. The daughter tried to show me the generalized rash but is really hard to tell due to the poor quality of the video. He also showed me the lower extremities, I did see the hyperpigmentation from stasis dermatitis and mild redness around  it but I really cannot tell if it is better or worse than the last visit.     Assessment     Assessment DM HTN Hyperlipidemia Morbid obesity Snoring  Palpitations, PVCs, h/o bradycardia,s/p. Dr Adin Hector day monitor, echo 2014: Normal LV, some LVH due to HTN Myasthenia gravis (ocular, Dr Tomi Likens)  h/o detached retina before   PLAN: Here with multiple concerns: Generalized rash since he was prescribed doxycycline on 05/28/2019 Cellulitis: Apparently not improving Fever 2 nights ago, shortness of breath, palpitations. I really cannot tell what the rash looks like or if the cellulitis is better or worse.  Recommend ER for further evaluation of rash, recent fever and cellulitis.  If he is indeed not better, might need IV antibiotics. Patient and daughter verbalized understanding.     I discussed the assessment and treatment plan with the patient. The patient was provided an opportunity to ask questions and all were answered. The patient agreed with the plan and demonstrated an understanding of the instructions.   The patient was advised to call back or seek an in-person evaluation if the symptoms worsen or if the condition fails to improve as anticipated.

## 2019-06-16 NOTE — ED Provider Notes (Signed)
[facility] EMERGENCY DEPARTMENT Provider Note   CSN: [id number] Arrival date & time: 06/16/19  1700     History   Chief Complaint Chief Complaint  Patient presents with  . Rash    HPI [name] [name] is a 78 y.o. male with PMH/o DM, GERD, HTN, who presents for evaluation of rash that is been ongoing for the last 2 weeks.  He states that rash is pruritic and sometimes painful.  He states that initially started 2 weeks ago and was initially only on his arms and then spread throughout his entire body.  He states he has had some chills but denies any fevers.  He has also occasionally had some intermittent shortness of breath.  He denies any chest pain.  He states he has not had any new exposures, lotions, soaps, detergents.  He does not know of any allergies.  He does report that he recently started on doxycycline 2 weeks ago prior to onset of rash.  This was being used to treat cellulitis of his bilateral legs.  He states he has not noted any changes in bilateral lower extremities.  Patient denies any abdominal pain, nausea/vomiting, swelling of his tongue or lips.     The history is provided by the patient.    Past Medical History:  Diagnosis Date  . Arthritis   . Complication of anesthesia     had spinal with knee surgeries  -"heart rate too low" for general  . Detached retina, left    L eye normal vision, reports that he has had a retina procedure in a doctor's office at [facility]   . Diabetes mellitus without complication ([place])   . Dysrhythmia   . GERD (gastroesophageal reflux disease)    hx no problems now  . H/O echocardiogram 2014  . History of hiatal hernia    ?20 yrs ago  . Hyperopia 2016  . Hypertension   . Lumbar stenosis with neurogenic claudication   . 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 of L eye,   . Prediabetes   . Presbyopia OU 2016    Patient Active Problem List   Diagnosis Date Noted  . Diabetes mellitus, type II ([place]) 07/09/2017  . Status  post lumbar surgery 06/06/2017  . 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 PVC's (premature ventricular contractions) 11/24/2015  . PCP NOTES >>>>>>>>>>>>>>>>>>>>>> 06/23/2015  . Morbid obesity ([place]) 02/24/2015  . Bradycardia 11/19/2013  . 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 Palpitations 04/19/2013  . Annual physical exam 12/07/2011  . R calf larger than L, chronic, U/S (-) for DVT 11-2011 12/07/2011  . Osteoarthritis 01/30/2008  . Hyperlipidemia 12/19/2007  . HTN (hypertension) 12/19/2007  . ERECTILE DYSFUNCTION 01/28/2007  . GERD 01/28/2007    Past Surgical History:  Procedure Laterality Date  . HERNIA REPAIR     bilateral inguinal hernia  . HERNIA REPAIR     umbilical  . JOINT REPLACEMENT  2009   right knee  . LUMBAR LAMINECTOMY/DECOMPRESSION MICRODISCECTOMY N/A 06/06/2017   Procedure: Decompression L3-5, insitu fusion L3-5 ;  Surgeon: Melina Schools, MD;  Location: [facility];  Service: Orthopedics;  Laterality: N/A;  . MIDDLE EAR SURGERY Right    ear -patched hole in ear drum  . TOTAL KNEE ARTHROPLASTY  09/10/2012   Procedure: TOTAL KNEE ARTHROPLASTY;  Surgeon: Tobi Bastos, MD;  Location: [facility];  Service: Orthopedics;  Laterality: Left;  . TOTAL SHOULDER ARTHROPLASTY Right 06/22/2016   Procedure: RIGHT TOTAL SHOULDER ARTHROPLASTY;  Surgeon: [name], MD;  Location: [place];  Service: Orthopedics;  Laterality: Right;        Home Medications    Prior to Admission medications   Medication Sig Start Date End Date Taking? Authorizing Provider  aspirin 81 MG tablet Take 81 mg by mouth daily.    [provider]  chlorthalidone (HYGROTON) 25 MG tablet Take 0.5 tablets (12.5 mg total) by mouth daily. 06/10/19   Colon Branch, MD  doxycycline (VIBRA-TABS) 100 MG tablet Take 1 tablet (100 mg total) by mouth 2 (two) times daily. 05/28/19   Colon Branch, MD  Flaxseed, Linseed, (FLAXSEED OIL) 1000 MG CAPS Take 1,000 mg by mouth daily.    [provider]  ketoconazole  (NIZORAL) 2 % cream Apply 1 application topically daily. 05/28/19   Colon Branch, MD  losartan (COZAAR) 100 MG tablet Take 1 tablet (100 mg total) by mouth daily. 05/15/19   Colon Branch, MD  metFORMIN (GLUCOPHAGE) 500 MG tablet Take 1 tablet (500 mg total) by mouth 2 (two) times daily with a meal. 04/04/19   [name], [name], MD  Omega-3 Fatty Acids (FISH OIL) 500 MG CAPS Take 1 capsule by mouth daily.    [provider]  omeprazole (PRILOSEC) 20 MG capsule Take 1 capsule (20 mg total) by mouth daily. Patient taking differently: Take 20 mg by mouth daily as needed.  10/02/16   Colon Branch, MD  potassium chloride (KLOR-CON M10) 10 MEQ tablet Take 1 tablet (10 mEq total) by mouth daily. 05/15/19   Colon Branch, MD  predniSONE (DELTASONE) 20 MG tablet Take 2 tablets (40 mg total) by mouth daily for 4 days. 06/16/19 06/20/19  [name], PA-C  simvastatin (ZOCOR) 80 MG tablet Take 0.5 tablets (40 mg total) by mouth daily. 10/07/18   Colon Branch, MD    Family History Family History  Problem Relation Age of Onset  . Diabetes Brother   . Colon cancer Brother 1       dx in 2018  . Hypertension Sister   . Prostate cancer Neg Hx   . Stroke Neg Hx   . CAD Neg Hx     Social History Social History   Tobacco Use  . Smoking status: Former Smoker    Packs/day: 0.50    Years: 2.00    Pack years: 1.00    Types: Cigarettes    Quit date: 06/09/1966    Years since quitting: 53.0  . Smokeless tobacco: Never Used  Substance Use Topics  . Alcohol use: No    Alcohol/week: 0.0 standard drinks    Comment: none  . Drug use: No     Allergies   Patient has no known allergies.   Review of Systems Review of Systems  Constitutional: Negative for fever.  Respiratory: Positive for shortness of breath. Negative for cough.   Cardiovascular: Negative for chest pain.  Gastrointestinal: Negative for abdominal pain, nausea and vomiting.  Genitourinary: Negative for dysuria and hematuria.  Skin:  Positive for rash.  Neurological: Negative for headaches.  All other systems reviewed and are negative.    Physical Exam Updated Vital Signs BP (!) 150/66 (BP Location: Right Arm)   Pulse 69   Temp 98.4 F (36.9 C) (Oral)   Resp 18   Ht 6' (1.829 m)   Wt 136.1 kg   SpO2 98%   BMI 40.69 kg/m   Physical Exam Vitals signs and nursing note reviewed.  Constitutional:  Appearance: Normal appearance. He is well-developed.  HENT:     Head: Normocephalic and atraumatic.     Mouth/Throat:     Comments: No oral lesions noted. Eyes:     General: Lids are normal.     Conjunctiva/sclera: Conjunctivae normal.     Pupils: Pupils are equal, round, and reactive to light.  Neck:     Musculoskeletal: Full passive range of motion without pain.  Cardiovascular:     Rate and Rhythm: Normal rate and regular rhythm.     Pulses: Normal pulses.          Radial pulses are 2+ on the right side and 2+ on the left side.       Dorsalis pedis pulses are 2+ on the right side and 2+ on the left side.     Heart sounds: Normal heart sounds. No murmur. No friction rub. No gallop.   Pulmonary:     Effort: Pulmonary effort is normal.     Breath sounds: Normal breath sounds.     Comments: Lungs clear to auscultation bilaterally.  Symmetric chest rise.  No wheezing, rales, rhonchi. Abdominal:     Palpations: Abdomen is soft. Abdomen is not rigid.     Tenderness: There is no abdominal tenderness. There is no guarding.     Comments: Abdomen is soft, non-distended, non-tender. No rigidity, No guarding. No peritoneal signs.  Musculoskeletal: Normal range of motion.  Skin:    General: Skin is warm and dry.     Capillary Refill: Capillary refill takes 2 to 3 seconds.     Findings: Rash present.     Comments: Chronic venous stasis changes noted to bilateral lower extremities.  There is some discoloration but no overlying warmth.  Bilateral lower extremities are not dusky in appearance or cool to touch.  He  has diffuse rash noted throughout entire body that has several characteristics.  He has a diffuse erythematous macular papular rash that is blanchable noted to his back.  He has a diffuse almost petechial-like rash noted to his bilateral lower extremities.  Additionally, he has a diffuse scattered rash noted to his palms and upper extremities.  No rash noted on soles.  Neurological:     Mental Status: He is alert and oriented to person, place, and time.  Psychiatric:        Speech: Speech normal.               ED Treatments / Results  Labs (all labs ordered are listed, but only abnormal results are displayed) Labs Reviewed  COMPREHENSIVE METABOLIC PANEL - Abnormal; Notable for the following components:      Result Value   Sodium 132 (*)    Chloride 93 (*)    Glucose, Bld 112 (*)    Calcium 8.6 (*)    Albumin 3.4 (*)    All other components within normal limits  CBC WITH DIFFERENTIAL/PLATELET - Abnormal; Notable for the following components:   RBC 4.00 (*)    Hemoglobin 11.8 (*)    HCT 35.8 (*)    Platelets 118 (*)    Monocytes Absolute 1.2 (*)    Eosinophils Absolute 1.1 (*)    Basophils Absolute 0.2 (*)    Abs Immature Granulocytes 0.10 (*)    All other components within normal limits  PROTIME-INR  RPR    EKG None  Radiology Dg Chest 2 View  Result Date: 06/16/2019 CLINICAL DATA:  Shortness of Breath EXAM: CHEST - 2 VIEW COMPARISON:  09/04/2012 FINDINGS: Cardiomegaly. Bibasilar atelectasis. No overt edema or effusions. No acute bony abnormality. IMPRESSION: Cardiomegaly.  Bibasilar atelectasis. Electronically Signed   By: Rolm Baptise M.D.   On: 06/16/2019 20:21    Procedures Procedures (including critical care time)  Medications Ordered in ED Medications - No data to display   Initial Impression / Assessment and Plan / ED Course  I have reviewed the triage vital signs and the nursing notes.  Pertinent labs & imaging results that were available during  my care of the patient were reviewed by me and considered in my medical decision making (see chart for details).        78 year old male who presents for evaluation of rash x2 weeks.  Also reports some intermittent shortness breath.  No fevers, chest pain.  States he has been on doxycycline over the last 2 weeks for cellulitis in that rash started afterwards. Patient is afebrile, non-toxic appearing, sitting comfortably on examination table. Vital signs reviewed and stable.  On exam, he has a diffusely scattered rash that has several characteristics in different places.  No oral lesions.  History/physical exam not concerning for shingles, infectious etiology, SJS, TENS.  Consider syphilis given that he has rash on palms.  Also consider allergic dermatitis.  We will plan to check labs.  Additionally, we will plan to check S chest x-ray for intermittent shortness of breath.  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  He states he has never had any other myasthenia gravis across the crisis.  Do not feel shortness of breath is related.  CBC shows no significant leukocytosis.  Hemoglobin is 11.8.  Platelets are 118.  CMP is unremarkable.  INR is 1.1.  Chest x-ray shows some bibasilar basilar atelectasis.  No other infectious etiology.  Will plan for RPR for evaluation of syphilis.  Reevaluation.  Patient's vitals are stable.  He is not hypoxic. Discussed patient with Dr. Johnney Killian after evaluation.  We will plan to treat rash as allergic/contact dermatitis with short course of prednisone.  Patient instructed follow-up with his primary care doctor in 3 days. At this time, patient exhibits no emergent life-threatening condition that require further evaluation in ED or admission. Patient had ample opportunity for questions and discussion. All patient's questions were answered with full understanding. Strict return precautions discussed. Patient expresses understanding and agreement to plan.   Portions  of this note were generated with [employer]. Dictation errors may occur despite best attempts at proofreading.   Final Clinical Impressions(s) / ED Diagnoses   Final diagnoses:  Rash    ED Discharge Orders         Ordered    predniSONE (DELTASONE) 20 MG tablet  Daily     06/16/19 2245           Desma Mcgregor 06/17/19 2155    Charlesetta Shanks, MD 06/29/19 1447

## 2019-06-16 NOTE — ED Triage Notes (Addendum)
Rash x 2 weeks. Pain swelling and redness in both feet x 2 weeks. Nausea.

## 2019-06-16 NOTE — Discharge Instructions (Signed)
Take prednisone as directed.   You can intermittently take Benadryl for itching.   As we discussed, you will need to follow-up with your primary care doctor in 3 days.  You have some lab work pending.  If there is anything abnormal, you will be notified.  Return the emergency department for any fever, worsening rash, difficulty breathing, chest pain or any other worsening or concerning symptoms.

## 2019-06-17 ENCOUNTER — Telehealth: Payer: Self-pay | Admitting: Internal Medicine

## 2019-06-17 LAB — RPR: RPR Ser Ql: NONREACTIVE

## 2019-06-17 MED ORDER — PROMETHAZINE HCL 12.5 MG PO TABS: 12.5000 mg | ORAL_TABLET | Freq: Four times a day (QID) | ORAL | 0 refills | Status: DC | PRN

## 2019-06-17 NOTE — Telephone Encounter (Signed)
Patient's daughter, [name], calling to inquire if Dr. [name] would like to see patient sooner than scheduled visit. Patient was seen in ED last night. She is requesting call back from CMA to discuss further.

## 2019-06-17 NOTE — Assessment & Plan Note (Signed)
Here with multiple concerns: Generalized rash since he was prescribed doxycycline on 05/28/2019 Cellulitis: Apparently not improving Fever 2 nights ago, shortness of breath, palpitations. I really cannot tell what the rash looks like or if the cellulitis is better or worse.  Recommend ER for further evaluation of rash, recent fever and cellulitis.  If he is indeed not better, might need IV antibiotics. Patient and daughter verbalized understanding.

## 2019-06-17 NOTE — Telephone Encounter (Signed)
Please advise 

## 2019-06-17 NOTE — Telephone Encounter (Signed)
Seen at the ER, chart reviewed: Chest x-ray was negative, 132, slightly low but better than previous. Calcium slightly low.  Kidney function is stable, CBC with no elevated white count but hemoglobin is slightly low at 1.8. Rash felt to be an allergic reaction and he was prescribed prednisone.  On reviewing the picture, rash indeed seems to be medication induced rash.  Spoke with the patient's daughter, [name] [phone]) She requests something for nausea, Phenergan sent. Recommend to continue prednisone, also stop doxycycline and okay to use Claritin for itching. He has an appointment with me in 3 days, recommend to keep it. Also recommend her to be in the PDR

## 2019-06-19 ENCOUNTER — Other Ambulatory Visit: Payer: Self-pay | Admitting: Internal Medicine

## 2019-06-20 ENCOUNTER — Ambulatory Visit: Payer: HMO

## 2019-06-20 ENCOUNTER — Other Ambulatory Visit: Payer: Self-pay

## 2019-06-20 ENCOUNTER — Encounter: Payer: Self-pay | Admitting: Internal Medicine

## 2019-06-20 ENCOUNTER — Other Ambulatory Visit: Payer: HMO

## 2019-06-20 ENCOUNTER — Ambulatory Visit (INDEPENDENT_AMBULATORY_CARE_PROVIDER_SITE_OTHER): Payer: HMO | Admitting: Internal Medicine

## 2019-06-20 VITALS — BP 161/56 | HR 78 | Temp 96.5°F | Resp 16 | Ht 72.0 in | Wt 311.0 lb

## 2019-06-20 DIAGNOSIS — I872 Venous insufficiency (chronic) (peripheral): Secondary | ICD-10-CM | POA: Diagnosis not present

## 2019-06-20 DIAGNOSIS — D649 Anemia, unspecified: Secondary | ICD-10-CM | POA: Diagnosis not present

## 2019-06-20 DIAGNOSIS — L03119 Cellulitis of unspecified part of limb: Secondary | ICD-10-CM

## 2019-06-20 DIAGNOSIS — R21 Rash and other nonspecific skin eruption: Secondary | ICD-10-CM | POA: Diagnosis not present

## 2019-06-20 DIAGNOSIS — Z23 Encounter for immunization: Secondary | ICD-10-CM

## 2019-06-20 LAB — CBC WITH DIFFERENTIAL/PLATELET
Absolute Monocytes: 863 cells/uL (ref 200–950)
Basophils Absolute: 158 cells/uL (ref 0–200)
Basophils Relative: 1.9 %
Eosinophils Absolute: 340 cells/uL (ref 15–500)
Eosinophils Relative: 4.1 %
HCT: 32.6 % — ABNORMAL LOW (ref 38.5–50.0)
Hemoglobin: 11.1 g/dL — ABNORMAL LOW (ref 13.2–17.1)
Lymphs Abs: 2382 cells/uL (ref 850–3900)
MCH: 29.7 pg (ref 27.0–33.0)
MCHC: 34 g/dL (ref 32.0–36.0)
MCV: 87.2 fL (ref 80.0–100.0)
MPV: 11.4 fL (ref 7.5–12.5)
Monocytes Relative: 10.4 %
Neutro Abs: 4557 cells/uL (ref 1500–7800)
Neutrophils Relative %: 54.9 %
Platelets: 237 10*3/uL (ref 140–400)
RBC: 3.74 10*6/uL — ABNORMAL LOW (ref 4.20–5.80)
RDW: 13.9 % (ref 11.0–15.0)
Total Lymphocyte: 28.7 %
WBC: 8.3 10*3/uL (ref 3.8–10.8)

## 2019-06-20 MED ORDER — CARVEDILOL 12.5 MG PO TABS: 12.5000 mg | ORAL_TABLET | Freq: Two times a day (BID) | ORAL | 6 refills | Status: DC

## 2019-06-20 NOTE — Progress Notes (Deleted)
Patient in today for blood pressure check  Patient is taking losartan 100mg  and chlorthaladone 25mg .  Blood pressure today:

## 2019-06-20 NOTE — Progress Notes (Signed)
Pre visit review using our clinic review tool, if applicable. No additional management support is needed unless otherwise documented below in the visit note. 

## 2019-06-20 NOTE — Progress Notes (Signed)
Subjective:    Patient ID: [name] [name], male    DOB: 1941/08/08, 78 y.o.   MRN: [id number]  DOS:  06/20/2019 Type of visit - description: ER follow-up  Went to the ER 06/16/2019, he was seen with generalized rash and other sx , vital signs stable, exam was confirmatory of the rash.  DDX was large but they considered an allergic dermatitis. Work-up included a CBC (hemoglobin 11.8, lower than before) , chest x-ray with some atelectasis, RPR nonreactive, Was prescribed prednisone for 4 days. Here for follow-up   Wt Readings from Last 3 Encounters:  06/20/19 (!) 311 lb (141.1 kg)  06/16/19 300 lb (136.1 kg)  05/28/19 (!) 314 lb (142.4 kg)    BP Readings from Last 3 Encounters:  06/20/19 (!) 161/56  06/16/19 (!) 150/66  05/28/19 (!) 161/56     Review of Systems After the ER visit, he took the prednisone and the rash quickly improved Nausea resolved No vomiting, diarrhea or blood in the stools. No further fevers.   Past Medical History:  Diagnosis Date  . Arthritis   . Complication of anesthesia     had spinal with knee surgeries  -"heart rate too low" for general  . Detached retina, left    L eye normal vision, reports that he has had a retina procedure in a doctor's office at [facility]   . Diabetes mellitus without complication ([place])   . Dysrhythmia   . GERD (gastroesophageal reflux disease)    hx no problems now  . H/O echocardiogram 2014  . History of hiatal hernia    ?20 yrs ago  . Hyperopia 2016  . Hypertension   . Lumbar stenosis with neurogenic claudication   . 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 of L eye,   . Prediabetes   . Presbyopia OU 2016    Past Surgical History:  Procedure Laterality Date  . HERNIA REPAIR     bilateral inguinal hernia  . HERNIA REPAIR     umbilical  . JOINT REPLACEMENT  2009   right knee  . LUMBAR LAMINECTOMY/DECOMPRESSION MICRODISCECTOMY N/A 06/06/2017   Procedure: Decompression L3-5, insitu fusion L3-5 ;  Surgeon: Melina Schools, MD;  Location: [facility];  Service: Orthopedics;  Laterality: N/A;  . MIDDLE EAR SURGERY Right    ear -patched hole in ear drum  . TOTAL KNEE ARTHROPLASTY  09/10/2012   Procedure: TOTAL KNEE ARTHROPLASTY;  Surgeon: Tobi Bastos, MD;  Location: [facility];  Service: Orthopedics;  Laterality: Left;  . TOTAL SHOULDER ARTHROPLASTY Right 06/22/2016   Procedure: RIGHT TOTAL SHOULDER ARTHROPLASTY;  Surgeon: [name], MD;  Location: [place] [facility];  Service: Orthopedics;  Laterality: Right;    Social History   Socioeconomic History  . Marital status: Married    Spouse name: Not on file  . Number of children: 4  . Years of education: Not on file  . Highest education level: Not on file  Occupational History  . Occupation: Audiological scientist-- retired 2010  Social Needs  . Financial resource strain: Not on file  . Food insecurity    Worry: Not on file    Inability: Not on file  . Transportation needs    Medical: Not on file    Non-medical: Not on file  Tobacco Use  . Smoking status: Former Smoker    Packs/day: 0.50    Years: 2.00    Pack years: 1.00    Types: Cigarettes    Quit date: 06/09/1966  Years since quitting: 53.0  . Smokeless tobacco: Never Used  Substance and Sexual Activity  . Alcohol use: No    Alcohol/week: 0.0 standard drinks    Comment: none  . Drug use: No  . Sexual activity: Yes    Partners: Female  Lifestyle  . Physical activity    Days per week: Not on file    Minutes per session: Not on file  . Stress: Not on file  Relationships  . Social Herbalist on phone: Not on file    Gets together: Not on file    Attends religious service: Not on file    Active member of club or organization: Not on file    Attends meetings of clubs or organizations: Not on file    Relationship status: Not on file  . Intimate partner violence    Fear of current or ex partner: Not on file    Emotionally abused: Not on file    Physically abused: Not on file    Forced  sexual activity: Not on file  Other Topics Concern  . Not on file  Social History Narrative   married, lives w/ second wife, 2 living children, lost 2 kids     Has a small dog      Allergies as of 06/20/2019      Reactions   Doxycycline    Rash , See office visit 06/16/2019      Medication List       Accurate as of June 20, 2019  3:13 PM. If you have any questions, ask your nurse or doctor.        aspirin 81 MG tablet Take 81 mg by mouth daily.   chlorthalidone 25 MG tablet Commonly known as: HYGROTON Take 1 tablet (25 mg total) by mouth daily.   Fish Oil 500 MG Caps Take 1 capsule by mouth daily.   Flaxseed Oil 1000 MG Caps Take 1,000 mg by mouth daily.   ketoconazole 2 % cream Commonly known as: NIZORAL Apply 1 application topically daily.   losartan 100 MG tablet Commonly known as: COZAAR Take 1 tablet (100 mg total) by mouth daily.   metFORMIN 500 MG tablet Commonly known as: GLUCOPHAGE Take 1 tablet (500 mg total) by mouth 2 (two) times daily with a meal.   omeprazole 20 MG capsule Commonly known as: PriLOSEC Take 1 capsule (20 mg total) by mouth daily. What changed:   when to take this  reasons to take this   potassium chloride 10 MEQ tablet Commonly known as: Klor-Con M10 Take 1 tablet (10 mEq total) by mouth daily.   predniSONE 20 MG tablet Commonly known as: DELTASONE Take 2 tablets (40 mg total) by mouth daily for 4 days.   promethazine 12.5 MG tablet Commonly known as: PHENERGAN Take 1 tablet (12.5 mg total) by mouth every 6 (six) hours as needed for nausea or vomiting.   simvastatin 80 MG tablet Commonly known as: ZOCOR Take 0.5 tablets (40 mg total) by mouth daily.           Objective:   Physical Exam BP (!) 161/56 (BP Location: Left Arm, Patient Position: Sitting, Cuff Size: Normal)   Pulse 78   Temp (!) 96.5 F (35.8 C) (Temporal)   Resp 16   Ht 6' (1.829 m)   Wt (!) 311 lb (141.1 kg)   SpO2 98%   BMI 42.18 kg/m   General:   Well developed, NAD, BMI noted. HEENT:  Normocephalic . Face symmetric, atraumatic Lungs:  CTA B Normal respiratory effort, no intercostal retractions, no accessory muscle use. Heart: RRR,  no murmur.  Right calf is slightly larger Skin: Persistent dark/thin skin patches almost on a symmetric fashion at the distal lower extremity.  No longer has redness, warmness, swelling. Neurologic:  alert & oriented X3.  Speech normal, gait appropriate for age and unassisted Psych--  Cognition and judgment appear intact.  Cooperative with normal attention span and concentration.  Behavior appropriate. No anxious or depressed appearing.      Assessment    Assessment DM HTN Hyperlipidemia Morbid obesity Snoring  Palpitations, PVCs, h/o bradycardia,s/p. Dr Adin Hector day monitor, echo 2014: Normal LV, some LVH due to HTN Myasthenia gravis (ocular, Dr Tomi Likens)  h/o detached retina before  Stasis dermatitis  PLAN: Generalized rash, nausea, fever: Status post ER eval, symptoms likely D/T doxycycline, had a round of prednisone, symptoms resolved.  CBC showed decreased hemoglobin, rechecking today. Cellulitis: See previous visits.  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  Call if no better HTN: Continue to be elevated at home in the 150s and 160s. Currently on losartan, chlorthalidone (decreased to 12.5 mg due to hyponatremia), potassium.  We will add carvedilol, continue monitoring BPs.  Recommend to call if he has any questions about HTN management.  See AVS. Flu shot today RTC 3 months

## 2019-06-20 NOTE — Patient Instructions (Addendum)
GO TO THE LAB : Get the blood work     [place] the appointment for September and schedule a follow up 3 months from today  In addition to your medicines, take carvedilol 12.5 mg twice a day.  This is blood pressure medication.    Check the  blood pressure 2 or 3 times a week BP GOAL is between 110/65 and  135/85.  If it is consistently higher or lower, let me know

## 2019-06-21 DIAGNOSIS — I872 Venous insufficiency (chronic) (peripheral): Secondary | ICD-10-CM | POA: Insufficient documentation

## 2019-06-21 NOTE — Assessment & Plan Note (Signed)
Generalized rash, nausea, fever: Status post ER eval, symptoms likely D/T doxycycline, had a round of prednisone, symptoms resolved.  CBC showed decreased hemoglobin, rechecking today. Cellulitis: See previous visits.  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  Call if no better HTN: Continue to be elevated at home in the 150s and 160s. Currently on losartan, chlorthalidone (decreased to 12.5 mg due to hyponatremia), potassium.  We will add carvedilol, continue monitoring BPs.  Recommend to call if he has any questions about HTN management.  See AVS. Flu shot today RTC 3 months

## 2019-07-01 ENCOUNTER — Ambulatory Visit: Payer: HMO | Admitting: Internal Medicine

## 2019-07-02 ENCOUNTER — Encounter: Payer: Self-pay | Admitting: Internal Medicine

## 2019-07-16 ENCOUNTER — Other Ambulatory Visit: Payer: Self-pay | Admitting: Internal Medicine

## 2019-08-02 ENCOUNTER — Other Ambulatory Visit: Payer: Self-pay

## 2019-08-02 ENCOUNTER — Encounter (HOSPITAL_BASED_OUTPATIENT_CLINIC_OR_DEPARTMENT_OTHER): Payer: Self-pay | Admitting: Emergency Medicine

## 2019-08-02 ENCOUNTER — Emergency Department (HOSPITAL_BASED_OUTPATIENT_CLINIC_OR_DEPARTMENT_OTHER)
Admission: EM | Admit: 2019-08-02 | Discharge: 2019-08-02 | Disposition: A | Discharge status: Home / Self Care | Payer: HMO | Attending: Emergency Medicine | Admitting: Emergency Medicine

## 2019-08-02 DIAGNOSIS — E119 Type 2 diabetes mellitus without complications: Secondary | ICD-10-CM | POA: Insufficient documentation

## 2019-08-02 DIAGNOSIS — R590 Localized enlarged lymph nodes: Secondary | ICD-10-CM | POA: Diagnosis not present

## 2019-08-02 DIAGNOSIS — Z7982 Long term (current) use of aspirin: Secondary | ICD-10-CM | POA: Insufficient documentation

## 2019-08-02 DIAGNOSIS — Z96611 Presence of right artificial shoulder joint: Secondary | ICD-10-CM | POA: Insufficient documentation

## 2019-08-02 DIAGNOSIS — Z7984 Long term (current) use of oral hypoglycemic drugs: Secondary | ICD-10-CM | POA: Diagnosis not present

## 2019-08-02 DIAGNOSIS — R221 Localized swelling, mass and lump, neck: Secondary | ICD-10-CM | POA: Diagnosis not present

## 2019-08-02 DIAGNOSIS — Z79899 Other long term (current) drug therapy: Secondary | ICD-10-CM | POA: Diagnosis not present

## 2019-08-02 DIAGNOSIS — Z87891 Personal history of nicotine dependence: Secondary | ICD-10-CM | POA: Diagnosis not present

## 2019-08-02 DIAGNOSIS — Z96652 Presence of left artificial knee joint: Secondary | ICD-10-CM | POA: Diagnosis not present

## 2019-08-02 DIAGNOSIS — Z96651 Presence of right artificial knee joint: Secondary | ICD-10-CM | POA: Insufficient documentation

## 2019-08-02 DIAGNOSIS — I10 Essential (primary) hypertension: Secondary | ICD-10-CM | POA: Diagnosis not present

## 2019-08-02 DIAGNOSIS — R591 Generalized enlarged lymph nodes: Secondary | ICD-10-CM | POA: Diagnosis not present

## 2019-08-02 MED ORDER — LIDOCAINE-EPINEPHRINE 2 %-1:100000 IJ SOLN
5.0000 mL | Freq: Once | INTRAMUSCULAR | Status: DC
  Filled 2019-08-02: qty 5.1

## 2019-08-02 MED ORDER — LIDOCAINE-EPINEPHRINE (PF) 2 %-1:200000 IJ SOLN
INTRAMUSCULAR | Status: AC
  Filled 2019-08-02: qty 10

## 2019-08-02 NOTE — ED Triage Notes (Signed)
Pt c/o possible boil to back of head x 1 week.

## 2019-08-02 NOTE — ED Provider Notes (Signed)
[facility] Provider Note   CSN: [id number] Arrival date & time: 08/02/19  1137     History   Chief Complaint Chief Complaint  Patient presents with  . Mass     left posterior head    HPI [name] [name] is a 78 y.o. male.     HPI   78 year old male with history below presents with concern for left posterior neck swelling.  Reports he believes he had a boil that started out as a small area and has grown over the last week.  Reports pain to the area is mild to moderate, worse with palpation.  Denies any fevers, nausea, vomiting or associated symptoms.  Past Medical History:  Diagnosis Date  . Arthritis   . Complication of anesthesia     had spinal with knee surgeries  -"heart rate too low" for general  . Detached retina, left    L eye normal vision, reports that he has had a retina procedure in a doctor's office at [facility]   . Diabetes mellitus without complication ([place])   . Dysrhythmia   . GERD (gastroesophageal reflux disease)    hx no problems now  . H/O echocardiogram 2014  . History of hiatal hernia    ?20 yrs ago  . Hyperopia 2016  . Hypertension   . Lumbar stenosis with neurogenic claudication   . 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 of L eye,   . Prediabetes   . Presbyopia OU 2016    Patient Active Problem List   Diagnosis Date Noted  . Stasis dermatitis of both legs 06/21/2019  . Diabetes mellitus, type II ([place]) 07/09/2017  . Status post lumbar surgery 06/06/2017  . 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 PVC's (premature ventricular contractions) 11/24/2015  . PCP NOTES >>>>>>>>>>>>>>>>>>>>>> 06/23/2015  . Morbid obesity ([place]) 02/24/2015  . Bradycardia 11/19/2013  . 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 Palpitations 04/19/2013  . Annual physical exam 12/07/2011  . R calf larger than L, chronic, U/S (-) for DVT 11-2011 12/07/2011  . Osteoarthritis 01/30/2008  . Hyperlipidemia 12/19/2007  . HTN (hypertension)  12/19/2007  . ERECTILE DYSFUNCTION 01/28/2007  . GERD 01/28/2007    Past Surgical History:  Procedure Laterality Date  . HERNIA REPAIR     bilateral inguinal hernia  . HERNIA REPAIR     umbilical  . JOINT REPLACEMENT  2009   right knee  . LUMBAR LAMINECTOMY/DECOMPRESSION MICRODISCECTOMY N/A 06/06/2017   Procedure: Decompression L3-5, insitu fusion L3-5 ;  Surgeon: Melina Schools, MD;  Location: [facility];  Service: Orthopedics;  Laterality: N/A;  . MIDDLE EAR SURGERY Right    ear -patched hole in ear drum  . TOTAL KNEE ARTHROPLASTY  09/10/2012   Procedure: TOTAL KNEE ARTHROPLASTY;  Surgeon: Tobi Bastos, MD;  Location: [facility];  Service: Orthopedics;  Laterality: Left;  . TOTAL SHOULDER ARTHROPLASTY Right 06/22/2016   Procedure: RIGHT TOTAL SHOULDER ARTHROPLASTY;  Surgeon: [name], MD;  Location: [facility];  Service: Orthopedics;  Laterality: Right;        Home Medications    Prior to Admission medications   Medication Sig Start Date End Date Taking? Authorizing Provider  aspirin 81 MG tablet Take 81 mg by mouth daily.    [provider]  carvedilol (COREG) 12.5 MG tablet Take 1 tablet (12.5 mg total) by mouth 2 (two) times daily with a meal. 06/20/19   Colon Branch, MD  chlorthalidone (HYGROTON) 25 MG tablet Take  0.5 tablets (12.5 mg total) by mouth daily. 07/16/19   Colon Branch, MD  Flaxseed, Linseed, (FLAXSEED OIL) 1000 MG CAPS Take 1,000 mg by mouth daily.    [provider]  ketoconazole (NIZORAL) 2 % cream Apply 1 application topically daily. 05/28/19   Colon Branch, MD  losartan (COZAAR) 100 MG tablet Take 1 tablet (100 mg total) by mouth daily. 05/15/19   Colon Branch, MD  metFORMIN (GLUCOPHAGE) 500 MG tablet Take 1 tablet (500 mg total) by mouth 2 (two) times daily with a meal. 04/04/19   [name], [name], MD  Omega-3 Fatty Acids (FISH OIL) 500 MG CAPS Take 1 capsule by mouth daily.    [provider]  omeprazole (PRILOSEC) 20 MG capsule Take 1 capsule (20 mg  total) by mouth daily. Patient taking differently: Take 20 mg by mouth daily as needed.  10/02/16   Colon Branch, MD  potassium chloride (KLOR-CON M10) 10 MEQ tablet Take 1 tablet (10 mEq total) by mouth daily. 05/15/19   Colon Branch, MD  promethazine (PHENERGAN) 12.5 MG tablet Take 1 tablet (12.5 mg total) by mouth every 6 (six) hours as needed for nausea or vomiting. Patient not taking: Reported on 06/20/2019 06/17/19   Colon Branch, MD  simvastatin (ZOCOR) 80 MG tablet Take 0.5 tablets (40 mg total) by mouth daily. 10/07/18   Colon Branch, MD    Family History Family History  Problem Relation Age of Onset  . Diabetes Brother   . Colon cancer Brother 74       dx in 2018  . Hypertension Sister   . Prostate cancer Neg Hx   . Stroke Neg Hx   . CAD Neg Hx     Social History Social History   Tobacco Use  . Smoking status: Former Smoker    Packs/day: 0.50    Years: 2.00    Pack years: 1.00    Types: Cigarettes    Quit date: 06/09/1966    Years since quitting: 53.1  . Smokeless tobacco: Never Used  Substance Use Topics  . Alcohol use: No    Alcohol/week: 0.0 standard drinks    Comment: none  . Drug use: No     Allergies   Doxycycline   Review of Systems Review of Systems  Constitutional: Negative for fever.  Respiratory: Negative for shortness of breath.   Cardiovascular: Negative for chest pain.  Gastrointestinal: Negative for nausea and vomiting.  Musculoskeletal: Positive for neck pain (area of swelling left posterior neck).  Skin: Negative for rash and wound.  Neurological: Negative for headaches.     Physical Exam Updated Vital Signs BP 115/74 (BP Location: Left Arm)   Pulse (!) 59   Temp 99.2 F (37.3 C) (Oral)   Resp 20   Ht 6' (1.829 m)   Wt (!) 138.3 kg   SpO2 99%   BMI 41.37 kg/m   Physical Exam Vitals signs and nursing note reviewed.  Constitutional:      General: He is not in acute distress.    Appearance: Normal appearance. He is not  ill-appearing.  HENT:     Head: Normocephalic and atraumatic.  Eyes:     Conjunctiva/sclera: Conjunctivae normal.  Neck:     Musculoskeletal: Normal range of motion. No neck rigidity.  Cardiovascular:     Rate and Rhythm: Normal rate and regular rhythm.     Pulses: Normal pulses.  Pulmonary:     Effort: Pulmonary effort is  normal. No respiratory distress.  Lymphadenopathy:     Cervical: Cervical adenopathy: area of swelling posterior cervical 2cmx1cm nodular, mobile lesion with tenderness, no surrounding erythema.  Skin:    General: Skin is warm and dry.     Capillary Refill: Capillary refill takes less than 2 seconds.  Neurological:     Mental Status: He is alert and oriented to person, place, and time.      ED Treatments / Results  Labs (all labs ordered are listed, but only abnormal results are displayed) Labs Reviewed - No data to display  EKG None  Radiology No results found.  Procedures Procedures (including critical care time)  Medications Ordered in ED Medications - No data to display   Initial Impression / Assessment and Plan / ED Course  I have reviewed the triage vital signs and the nursing notes.  Pertinent labs & imaging results that were available during my care of the patient were reviewed by me and considered in my medical decision making (see chart for details).         78 year old male with history below presents with concern for left posterior neck swelling.  Area does not feel fluctuant, is not erythematous, and overall seems most consistent with posterior lymphadenopathy or cyst and less likely to be abscess.  Did look with ultrasound and attempt needle aspiration without any purulence..  Does seem somewhat posterior for a posterior cervical lymph node, but may be lymphadenopathy or cyst. Discussed recommendation for continued monitoring, PCP follow up for likely outpatient [place]. Do not feel exam is consistent with abscess. Patient discharged in  stable condition with understanding of reasons to return.   Final Clinical Impressions(s) / ED Diagnoses   Final diagnoses:  Lymphadenopathy of left cervical region  Localized swelling, mass and lump, neck    ED Discharge Orders    None       Gareth Morgan, MD 08/03/19 2307

## 2019-08-04 ENCOUNTER — Telehealth: Payer: Self-pay | Admitting: *Deleted

## 2019-08-04 NOTE — Telephone Encounter (Signed)
Patient went to ED   Who Is Calling Patient / Member / Family / Caregiver Call Type Triage / Clinical Relationship To Patient Self Return Phone Number [phone] (Primary) Chief Complaint Skin Lesion - Moles/ Lumps/ Growths Reason for Call Symptomatic / Request for Health Information Initial Comment Caller states he has a boil on his neck that's painful Translation No Nurse Assessment Nurse: Freddie Apley, RN, Malachi Paradise Date/Time (Eastern Time): 08/02/2019 9:47:11 AM Confirm and document reason for call. If symptomatic, describe symptoms. ---Caller states that he has a boil on the back of his neck and is very painful, wants it to be lanced. Boil has been present since last Saturday. Causes his shoulder and older side of his head to hurt. 8/10 pain. Has not taken anything for pain.

## 2019-08-07 ENCOUNTER — Ambulatory Visit (HOSPITAL_BASED_OUTPATIENT_CLINIC_OR_DEPARTMENT_OTHER)
Admission: RE | Admit: 2019-08-07 | Discharge: 2019-08-07 | Disposition: A | Discharge status: Home / Self Care | Payer: HMO | Source: Ambulatory Visit | Attending: Internal Medicine | Admitting: Internal Medicine

## 2019-08-07 ENCOUNTER — Ambulatory Visit (INDEPENDENT_AMBULATORY_CARE_PROVIDER_SITE_OTHER): Payer: HMO | Admitting: Internal Medicine

## 2019-08-07 ENCOUNTER — Encounter: Payer: Self-pay | Admitting: Internal Medicine

## 2019-08-07 ENCOUNTER — Other Ambulatory Visit: Payer: Self-pay

## 2019-08-07 VITALS — BP 150/61 | HR 68 | Temp 96.8°F | Resp 16 | Ht 72.0 in | Wt 292.5 lb

## 2019-08-07 DIAGNOSIS — R221 Localized swelling, mass and lump, neck: Secondary | ICD-10-CM | POA: Insufficient documentation

## 2019-08-07 DIAGNOSIS — D649 Anemia, unspecified: Secondary | ICD-10-CM | POA: Diagnosis not present

## 2019-08-07 DIAGNOSIS — R05 Cough: Secondary | ICD-10-CM | POA: Diagnosis not present

## 2019-08-07 DIAGNOSIS — I10 Essential (primary) hypertension: Secondary | ICD-10-CM

## 2019-08-07 DIAGNOSIS — R0602 Shortness of breath: Secondary | ICD-10-CM | POA: Diagnosis not present

## 2019-08-07 LAB — COMPREHENSIVE METABOLIC PANEL
ALT: 9 U/L (ref 0–53)
AST: 9 U/L (ref 0–37)
Albumin: 3.9 g/dL (ref 3.5–5.2)
Alkaline Phosphatase: 94 U/L (ref 39–117)
BUN: 14 mg/dL (ref 6–23)
CO2: 26 mEq/L (ref 19–32)
Calcium: 9 mg/dL (ref 8.4–10.5)
Chloride: 95 mEq/L — ABNORMAL LOW (ref 96–112)
Creatinine, Ser: 1.01 mg/dL (ref 0.40–1.50)
GFR: 71.62 mL/min (ref >60.00)
Glucose, Bld: 113 mg/dL — ABNORMAL HIGH (ref 70–99)
Potassium: 4.4 mEq/L (ref 3.5–5.1)
Sodium: 132 mEq/L — ABNORMAL LOW (ref 135–145)
Total Bilirubin: 0.6 mg/dL (ref 0.2–1.2)
Total Protein: 7.6 g/dL (ref 6.0–8.3)

## 2019-08-07 LAB — FERRITIN: Ferritin: 164.5 ng/mL (ref 22.0–322.0)

## 2019-08-07 LAB — IRON: Iron: 35 ug/dL — ABNORMAL LOW (ref 42–165)

## 2019-08-07 NOTE — Assessment & Plan Note (Addendum)
Neck mass: He clearly has a mass at the left posterior neck, on exam he also seem to have a bilateral supraclavicular mass and a question of right neck mass. Although the L neck  mass have benign features, given the other findings I am referring him to ENT for consideration of a biopsy of the left neck mass . In addition we will do CBC with a peripheral smear, CMP, LDH and a chest x-ray.  Also iron and ferritin (mild anemia) HTN: Slightly elevated, recommend to continue monitoring Follow-up already scheduled for next month

## 2019-08-07 NOTE — Patient Instructions (Addendum)
Please schedule Medicare Wellness with Glen[facility].   Per our records you are due for an eye exam. Please contact your eye doctor to schedule an appointment. Please have them send copies of your office visit notes to [place]. Our fax number is [phone].   GO TO THE LAB : Get the blood work     STOP BY THE FIRST FLOOR:  get the XR   See you next month  Check the  blood pressure 2 or 3 times a week BP GOAL is between 110/65 and  135/85. If it is consistently higher or lower, let me know

## 2019-08-07 NOTE — Progress Notes (Signed)
Subjective:    Patient ID: [name] [name], male    DOB: [birth year], 78 y.o.   MRN: [id number]  DOS:  08/07/2019 Type of visit - description: ER follow-up Noted a lump at the left neck 07/27/2019. Went to the ER 08/02/2019, w/a  left posterior neck swelling.  On exam, findings consistent with lymphadenopathy or cyst. The did an aspiration: no pus obtained They doubted abscess, was recommended to come here for a checkup.   BP Readings from Last 3 Encounters:  08/07/19 (!) 150/61  08/02/19 115/74  06/20/19 (!) 161/56    Review of Systems Denies fever chills Some weight loss but he is eating better Denies dyspepsia or GERD symptoms No cough   Past Medical History:  Diagnosis Date  . Arthritis   . Complication of anesthesia     had spinal with knee surgeries  -"heart rate too low" for general  . Detached retina, left    L eye normal vision, reports that he has had a retina procedure in a doctor's office at [facility]   . Diabetes mellitus without complication ([place])   . Dysrhythmia   . GERD (gastroesophageal reflux disease)    hx no problems now  . H/O echocardiogram 2014  . History of hiatal hernia    ?20 yrs ago  . Hyperopia 2016  . Hypertension   . Lumbar stenosis with neurogenic claudication   . 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 of L eye,   . Prediabetes   . Presbyopia OU 2016    Past Surgical History:  Procedure Laterality Date  . HERNIA REPAIR     bilateral inguinal hernia  . HERNIA REPAIR     umbilical  . JOINT REPLACEMENT  2009   right knee  . LUMBAR LAMINECTOMY/DECOMPRESSION MICRODISCECTOMY N/A 06/06/2017   Procedure: Decompression L3-5, insitu fusion L3-5 ;  Surgeon: Melina Schools, MD;  Location: [facility];  Service: Orthopedics;  Laterality: N/A;  . MIDDLE EAR SURGERY Right    ear -patched hole in ear drum  . TOTAL KNEE ARTHROPLASTY  09/10/2012   Procedure: TOTAL KNEE ARTHROPLASTY;  Surgeon: Tobi Bastos, MD;  Location: [facility];  Service:  Orthopedics;  Laterality: Left;  . TOTAL SHOULDER ARTHROPLASTY Right 06/22/2016   Procedure: RIGHT TOTAL SHOULDER ARTHROPLASTY;  Surgeon: [name], MD;  Location: [facility];  Service: Orthopedics;  Laterality: Right;    Social History   Socioeconomic History  . Marital status: Married    Spouse name: Not on file  . Number of children: 4  . Years of education: Not on file  . Highest education level: Not on file  Occupational History  . Occupation: Audiological scientist-- retired 2010  Social Needs  . Financial resource strain: Not on file  . Food insecurity    Worry: Not on file    Inability: Not on file  . Transportation needs    Medical: Not on file    Non-medical: Not on file  Tobacco Use  . Smoking status: Former Smoker    Packs/day: 0.50    Years: 2.00    Pack years: 1.00    Types: Cigarettes    Quit date: 06/09/1966    Years since quitting: 53.1  . Smokeless tobacco: Never Used  Substance and Sexual Activity  . Alcohol use: No    Alcohol/week: 0.0 standard drinks    Comment: none  . Drug use: No  . Sexual activity: Yes    Partners: Female  Lifestyle  .  Physical activity    Days per week: Not on file    Minutes per session: Not on file  . Stress: Not on file  Relationships  . Social Herbalist on phone: Not on file    Gets together: Not on file    Attends religious service: Not on file    Active member of club or organization: Not on file    Attends meetings of clubs or organizations: Not on file    Relationship status: Not on file  . Intimate partner violence    Fear of current or ex partner: Not on file    Emotionally abused: Not on file    Physically abused: Not on file    Forced sexual activity: Not on file  Other Topics Concern  . Not on file  Social History Narrative   married, lives w/ second wife, 2 living children, lost 2 kids     Has a small dog      Allergies as of 08/07/2019      Reactions   Doxycycline    Rash , See office visit  06/16/2019      Medication List       Accurate as of August 07, 2019 11:02 AM. If you have any questions, ask your nurse or doctor.        aspirin 81 MG tablet Take 81 mg by mouth daily.   carvedilol 12.5 MG tablet Commonly known as: COREG Take 1 tablet (12.5 mg total) by mouth 2 (two) times daily with a meal.   chlorthalidone 25 MG tablet Commonly known as: HYGROTON Take 0.5 tablets (12.5 mg total) by mouth daily.   Fish Oil 500 MG Caps Take 1 capsule by mouth daily.   Flaxseed Oil 1000 MG Caps Take 1,000 mg by mouth daily.   ketoconazole 2 % cream Commonly known as: NIZORAL Apply 1 application topically daily.   losartan 100 MG tablet Commonly known as: COZAAR Take 1 tablet (100 mg total) by mouth daily.   metFORMIN 500 MG tablet Commonly known as: GLUCOPHAGE Take 1 tablet (500 mg total) by mouth 2 (two) times daily with a meal.   omeprazole 20 MG capsule Commonly known as: PriLOSEC Take 1 capsule (20 mg total) by mouth daily. What changed:   when to take this  reasons to take this   potassium chloride 10 MEQ tablet Commonly known as: [place]-Con M10 Take 1 tablet (10 mEq total) by mouth daily.   promethazine 12.5 MG tablet Commonly known as: PHENERGAN Take 1 tablet (12.5 mg total) by mouth every 6 (six) hours as needed for nausea or vomiting.   simvastatin 80 MG tablet Commonly known as: ZOCOR Take 0.5 tablets (40 mg total) by mouth daily.           Objective:   Physical Exam Neck:   Chest:      BP (!) 150/61 (BP Location: Left Arm, Patient Position: Sitting, Cuff Size: Normal)   Pulse 68   Temp (!) 96.8 F (36 C) (Temporal)   Resp 16   Ht 6' (1.829 m)   Wt 292 lb 8 oz (132.7 kg)   SpO2 98%   BMI 39.67 kg/m  General:   Well developed, NAD, BMI noted.  HEENT:  Normocephalic . Face symmetric, atraumatic Neck: No thyromegaly. Lymphatic system: See supraclavicular exam No axillary lymph nodes He has a couple of inguinal lymph  nodes, not enlarged. Lungs:  CTA B Normal respiratory effort, no intercostal retractions, no  accessory muscle use. Heart: RRR,  no murmur.  no pretibial edema bilaterally  Abdomen:  Not distended, soft, non-tender. No rebound or rigidity.   Skin: Pretibial skin at baseline Neurologic:  alert & oriented X3.  Speech normal, gait at baseline Psych--  Cognition and judgment appear intact.  Cooperative with normal attention span and concentration.  Behavior appropriate. No anxious or depressed appearing.     Assessment     Assessment DM HTN Hyperlipidemia Morbid obesity Snoring  Palpitations, PVCs, h/o bradycardia,s/p. Dr Adin Hector day monitor, echo 2014: Normal LV, some LVH due to HTN Myasthenia gravis (ocular, Dr Tomi Likens)  h/o detached retina before  Stasis dermatitis  PLAN: Neck mass: He clearly has a mass at the left posterior neck, on exam he also seem to have a bilateral supraclavicular mass and a question of right neck mass. Although the L neck  mass have benign features, given the other findings I am referring him to ENT for consideration of a biopsy of the left neck mass . In addition we will do CBC with a peripheral smear, CMP, LDH and a chest x-ray.  Also iron and ferritin (mild anemia) HTN: Slightly elevated, recommend to continue monitoring Follow-up already scheduled for next month

## 2019-08-07 NOTE — Progress Notes (Signed)
Pre visit review using our clinic review tool, if applicable. No additional management support is needed unless otherwise documented below in the visit note. 

## 2019-08-08 LAB — CBC (INCLUDES DIFF/PLT) WITH PATHOLOGIST REVIEW
Absolute Monocytes: 1014 cells/uL — ABNORMAL HIGH (ref 200–950)
Basophils Absolute: 102 cells/uL (ref 0–200)
Basophils Relative: 1.1 %
Eosinophils Absolute: 512 cells/uL — ABNORMAL HIGH (ref 15–500)
Eosinophils Relative: 5.5 %
HCT: 37 % — ABNORMAL LOW (ref 38.5–50.0)
Hemoglobin: 12.5 g/dL — ABNORMAL LOW (ref 13.2–17.1)
Lymphs Abs: 809 cells/uL — ABNORMAL LOW (ref 850–3900)
MCH: 28.1 pg (ref 27.0–33.0)
MCHC: 33.8 g/dL (ref 32.0–36.0)
MCV: 83.1 fL (ref 80.0–100.0)
MPV: 10.9 fL (ref 7.5–12.5)
Monocytes Relative: 10.9 %
Neutro Abs: 6863 cells/uL (ref 1500–7800)
Neutrophils Relative %: 73.8 %
Platelets: 295 10*3/uL (ref 140–400)
RBC: 4.45 10*6/uL (ref 4.20–5.80)
RDW: 14.1 % (ref 11.0–15.0)
Total Lymphocyte: 8.7 %
WBC: 9.3 10*3/uL (ref 3.8–10.8)

## 2019-08-08 LAB — LACTATE DEHYDROGENASE: LDH: 238 U/L (ref 120–250)

## 2019-08-12 DIAGNOSIS — R590 Localized enlarged lymph nodes: Secondary | ICD-10-CM | POA: Diagnosis not present

## 2019-08-12 DIAGNOSIS — R221 Localized swelling, mass and lump, neck: Secondary | ICD-10-CM | POA: Diagnosis not present

## 2019-08-19 ENCOUNTER — Other Ambulatory Visit: Payer: Self-pay | Admitting: Otolaryngology

## 2019-08-19 DIAGNOSIS — R221 Localized swelling, mass and lump, neck: Secondary | ICD-10-CM

## 2019-08-22 ENCOUNTER — Ambulatory Visit: Payer: HMO | Admitting: Internal Medicine

## 2019-08-25 ENCOUNTER — Other Ambulatory Visit: Payer: Self-pay

## 2019-08-25 ENCOUNTER — Ambulatory Visit
Admission: RE | Admit: 2019-08-25 | Discharge: 2019-08-25 | Disposition: A | Discharge status: Home / Self Care | Payer: HMO | Source: Ambulatory Visit | Attending: Otolaryngology | Admitting: Otolaryngology

## 2019-08-25 DIAGNOSIS — R590 Localized enlarged lymph nodes: Secondary | ICD-10-CM | POA: Diagnosis not present

## 2019-08-25 DIAGNOSIS — R918 Other nonspecific abnormal finding of lung field: Secondary | ICD-10-CM | POA: Diagnosis not present

## 2019-08-25 DIAGNOSIS — R221 Localized swelling, mass and lump, neck: Secondary | ICD-10-CM

## 2019-08-25 MED ORDER — IOPAMIDOL (ISOVUE-300) INJECTION 61%
75.0000 mL | Freq: Once | INTRAVENOUS | Status: AC | PRN
  Administered 2019-08-25: 12:00:00 75 mL via INTRAVENOUS

## 2019-08-29 ENCOUNTER — Encounter (HOSPITAL_COMMUNITY): Payer: Self-pay | Admitting: *Deleted

## 2019-08-29 ENCOUNTER — Other Ambulatory Visit: Payer: Self-pay

## 2019-08-29 NOTE — Progress Notes (Signed)
Spoke with pt for pre-op call. Pt states he has a "slight" irregular heart rate but denies any other heart problems. Pt is a type 2 diabetic. Last A1C was 6.7 on 04/22/19. Pt states he does not have a CBG meter at home.   Pt will go to get his Covid test done on Saturday. Instructed him to go between 9 AM and 11:45 AM. I then instructed him on being in quarantine once he has the test done. He voiced understanding.   Pt informed of Visitation policy and voiced understanding.

## 2019-08-29 NOTE — H&P (Signed)
Otolaryngology Clinic Note  HPI:    [name] [name] is a 78 y.o. male patient of Tami Lin, MD for evaluation of neck masses.  He developed a left posterior neck mass approximately 3 weeks ago.  Later, he noticed some mid and inferior neck masses on the right side which seemed to come and go.  They are all somewhat tender.  He has not noticed axillary or groin nodes.  he does not smoke.  Chest x-ray was reportedly clear.  No fevers or night sweats.  He has lost 15 pounds unintentionally. PMH/Meds/All/SocHx/FamHx/ROS:   Past Medical History      Past Medical History:  Diagnosis Date  . Diabetes mellitus ([place])   . Hypertension       Past Surgical History  History reviewed. No pertinent surgical history.    No family history of bleeding disorders, wound healing problems or difficulty with anesthesia.   Social History  Social History        Socioeconomic History  . Marital status: Married    Spouse name: Not on file  . Number of children: Not on file  . Years of education: Not on file  . Highest education level: Not on file  Occupational History  . Not on file  Social Needs  . Financial resource strain: Not on file  . Food insecurity    Worry: Not on file    Inability: Not on file  . Transportation needs    Medical: Not on file    Non-medical: Not on file  Tobacco Use  . Smoking status: Former Research scientist (life sciences)  . Smokeless tobacco: Never Used  Substance and Sexual Activity  . Alcohol use: Not Currently  . Drug use: Never  . Sexual activity: Not on file  Lifestyle  . Physical activity    Days per week: Not on file    Minutes per session: Not on file  . Stress: Not on file  Relationships  . Social Herbalist on phone: Not on file    Gets together: Not on file    Attends religious service: Not on file    Active member of club or organization: Not on file    Attends meetings of clubs or organizations: Not on file     Relationship status: Not on file  Other Topics Concern  . Not on file  Social History Narrative  . Not on file       Current Outpatient Medications:  .  ketoconazole (NIZORAL) 2 % cream, Apply 2 application topically daily., Disp: , Rfl:  .  potassium chloride ER (KLOR-CON M10) 10 MEQ extended release tablet, Take 10 MBq by mouth daily., Disp: , Rfl:  .  promethazine (PHENERGAN) 12.5 MG tablet, Take 12.5 mg by mouth 2 (two) times daily as needed., Disp: , Rfl:  .  simvastatin (ZOCOR) 80 MG tablet, Take 80 mg by mouth daily., Disp: , Rfl:  .  carvediloL (COREG) 12.5 MG tablet, Take 12.5 mg by mouth daily., Disp: , Rfl:  .  chlorthalidone (HYGROTON) 25 MG tablet, Take 25 mg by mouth daily., Disp: , Rfl:  .  flaxseed oil 1,000 mg Cap, Take 1,000 mg by mouth daily., Disp: , Rfl:  .  losartan (COZAAR) 100 MG tablet, Take 100 mg by mouth daily., Disp: , Rfl:  .  metFORMIN (GLUCOPHAGE) 500 MG tablet, Take 500 mg by mouth daily., Disp: , Rfl:   A complete ROS was performed with pertinent positives/negatives noted in the HPI. The  remainder of the ROS are negative.    Physical Exam:    BP 140/100 (Site: Left arm, Position: Sitting, BP Cuff Size: Large)   Pulse 70   Temp 97.1 F (36.2 C) (Temporal)   Resp 14   Ht 1.88 m (6\' 2" )   Wt 131.5 kg (290 lb)   BMI 37.23 kg/m  He is quite heavyset.  Mental status is appropriate.  He hears well in conversational speech.  The head is atraumatic and neck supple.  He has multiple seborrheic keratosis over his scalp and face/neck.  Ears are clear with normal aerated drums.  Anterior nose is moist and patent.  Oral cavity is clear with few remaining teeth in fair repair.  Oropharynx is clear.  Neck exam is remarkable for a rubbery thick lozenge shaped right posterior neck mass approximately 3.5 cm in greatest extent.  No overlying skin changes.  He has some deep jugular nodes on the right side, and also multiple supraclavicular nodes.  He may have a  single left axillary node.   FNA  The procedure was described in detail.  Risks and complications were discussed.  Questions were answered and informed consent was obtained verbally.  Preop Diagnosis: Cervical adenopathy Procedure: Using a 20 cc syringe with a 21 gauge needle, the mass was aspirated.  Cytology samples were prepared. Hemostasis was observed.  A dressing was applied.  Tolerance:excellent Unplanned interventions:  None  Unplanned events:  No complications   Impression & Plans:   Right and left cervical adenopathy.  Plan: I will call him with the results of the FNA.  I will order a CT scan of the neck and chest and call him with these results as well.   Lilyan Gilford, MD  [id number]

## 2019-08-30 ENCOUNTER — Other Ambulatory Visit (HOSPITAL_COMMUNITY)
Admission: RE | Admit: 2019-08-30 | Discharge: 2019-08-30 | Disposition: A | Discharge status: Home / Self Care | Payer: HMO | Source: Ambulatory Visit | Attending: Otolaryngology | Admitting: Otolaryngology

## 2019-08-30 DIAGNOSIS — Z20828 Contact with and (suspected) exposure to other viral communicable diseases: Secondary | ICD-10-CM | POA: Insufficient documentation

## 2019-08-30 DIAGNOSIS — Z01812 Encounter for preprocedural laboratory examination: Secondary | ICD-10-CM | POA: Insufficient documentation

## 2019-08-31 LAB — SARS CORONAVIRUS 2 (TAT 6-24 HRS): SARS Coronavirus 2: NEGATIVE

## 2019-08-31 NOTE — Anesthesia Preprocedure Evaluation (Addendum)
Anesthesia Evaluation  Patient identified by MRN, date of birth, ID band Patient awake    Reviewed: Allergy & Precautions, H&P , NPO status , Patient's Chart, lab work & pertinent test results, reviewed documented beta blocker date and time   Airway Mallampati: III  TM Distance: >3 FB Neck ROM: Limited   Comment: LARGE ANTERIOR CERVICAL LYMPH NODE MASS THAT EXTENDS BOTH POSTERIOR AND ANTERIOR TO ALMOST MIDLINE. QUESTIONABLE TRACHEAL DISPLACEMENT.   Dental no notable dental hx. (+) Dental Advisory Given, Partial Upper, Missing, Poor Dentition   Pulmonary former smoker,    Pulmonary exam normal breath sounds clear to auscultation       Cardiovascular hypertension, On Medications  Rhythm:regular Rate:Normal     Neuro/Psych Myasthenia Gravis  Neuromuscular disease    GI/Hepatic Neg liver ROS, hiatal hernia, GERD  ,  Endo/Other  diabetesMorbid obesity  Renal/GU negative Renal ROS     Musculoskeletal  (+) Arthritis , Osteoarthritis,    Abdominal   Peds  Hematology negative hematology ROS (+)   Anesthesia Other Findings   Reproductive/Obstetrics                           Anesthesia Physical  Anesthesia Plan  ASA: III  Anesthesia Plan: General   Post-op Pain Management:    Induction: Intravenous  PONV Risk Score and Plan: 2 and Ondansetron, Dexamethasone and Treatment may vary due to age or medical condition  Airway Management Planned: Oral ETT and LMA  Additional Equipment:   Intra-op Plan:   Post-operative Plan: Extubation in OR  Informed Consent: I have reviewed the patients History and Physical, chart, labs and discussed the procedure including the risks, benefits and alternatives for the proposed anesthesia with the patient or authorized representative who has indicated his/her understanding and acceptance.     Dental Advisory Given and Dental advisory given  Plan Discussed  with: CRNA, Anesthesiologist and Surgeon  Anesthesia Plan Comments: (See airway not.  Large cervical mass will plan LMA vs Glide intubation.   )       Anesthesia Quick Evaluation

## 2019-09-01 ENCOUNTER — Encounter (HOSPITAL_COMMUNITY): Payer: Self-pay

## 2019-09-01 ENCOUNTER — Other Ambulatory Visit: Payer: Self-pay

## 2019-09-01 ENCOUNTER — Ambulatory Visit (HOSPITAL_COMMUNITY)
Admission: RE | Admit: 2019-09-01 | Discharge: 2019-09-01 | Disposition: A | Discharge status: Home / Self Care | Payer: HMO | Attending: Otolaryngology | Admitting: Otolaryngology

## 2019-09-01 ENCOUNTER — Encounter (HOSPITAL_COMMUNITY)
Admission: RE | Disposition: A | Discharge status: Home / Self Care | Payer: Self-pay | Source: Home / Self Care | Attending: Otolaryngology

## 2019-09-01 ENCOUNTER — Ambulatory Visit (HOSPITAL_COMMUNITY): Payer: HMO | Admitting: Anesthesiology

## 2019-09-01 DIAGNOSIS — Z6839 Body mass index (BMI) 39.0-39.9, adult: Secondary | ICD-10-CM | POA: Diagnosis not present

## 2019-09-01 DIAGNOSIS — Z7984 Long term (current) use of oral hypoglycemic drugs: Secondary | ICD-10-CM | POA: Diagnosis not present

## 2019-09-01 DIAGNOSIS — Z87891 Personal history of nicotine dependence: Secondary | ICD-10-CM | POA: Diagnosis not present

## 2019-09-01 DIAGNOSIS — C865 Angioimmunoblastic T-cell lymphoma: Secondary | ICD-10-CM | POA: Diagnosis not present

## 2019-09-01 DIAGNOSIS — E119 Type 2 diabetes mellitus without complications: Secondary | ICD-10-CM | POA: Diagnosis not present

## 2019-09-01 DIAGNOSIS — I10 Essential (primary) hypertension: Secondary | ICD-10-CM | POA: Diagnosis not present

## 2019-09-01 DIAGNOSIS — R221 Localized swelling, mass and lump, neck: Secondary | ICD-10-CM | POA: Diagnosis not present

## 2019-09-01 DIAGNOSIS — Z79899 Other long term (current) drug therapy: Secondary | ICD-10-CM | POA: Diagnosis not present

## 2019-09-01 DIAGNOSIS — E785 Hyperlipidemia, unspecified: Secondary | ICD-10-CM | POA: Diagnosis not present

## 2019-09-01 DIAGNOSIS — R590 Localized enlarged lymph nodes: Secondary | ICD-10-CM | POA: Diagnosis not present

## 2019-09-01 HISTORY — PX: LYMPH NODE BIOPSY: SHX201

## 2019-09-01 LAB — BASIC METABOLIC PANEL
Anion gap: 12 (ref 5–15)
BUN: 13 mg/dL (ref 8–23)
CO2: 20 mmol/L — ABNORMAL LOW (ref 22–32)
Calcium: 9.2 mg/dL (ref 8.9–10.3)
Chloride: 101 mmol/L (ref 98–111)
Creatinine, Ser: 1.32 mg/dL — ABNORMAL HIGH (ref 0.61–1.24)
GFR calc Af Amer: 60 mL/min — ABNORMAL LOW (ref >60)
GFR calc non Af Amer: 52 mL/min — ABNORMAL LOW (ref >60)
Glucose, Bld: 119 mg/dL — ABNORMAL HIGH (ref 70–99)
Potassium: 4.2 mmol/L (ref 3.5–5.1)
Sodium: 133 mmol/L — ABNORMAL LOW (ref 135–145)

## 2019-09-01 LAB — GLUCOSE, CAPILLARY
Glucose-Capillary: 119 mg/dL — ABNORMAL HIGH (ref 70–99)
Glucose-Capillary: 118 mg/dL — ABNORMAL HIGH (ref 70–99)

## 2019-09-01 SURGERY — LYMPH NODE BIOPSY
Anesthesia: Monitor Anesthesia Care | Site: Neck | Laterality: Right

## 2019-09-01 MED ORDER — FENTANYL CITRATE (PF) 100 MCG/2ML IJ SOLN
INTRAMUSCULAR | Status: DC | PRN
  Administered 2019-09-01: 50 ug via INTRAVENOUS
  Administered 2019-09-01: 25 ug via INTRAVENOUS

## 2019-09-01 MED ORDER — METHYLENE BLUE 0.5 % INJ SOLN
INTRAVENOUS | Status: AC
  Filled 2019-09-01: qty 10

## 2019-09-01 MED ORDER — 0.9 % SODIUM CHLORIDE (POUR BTL) OPTIME
TOPICAL | Status: DC | PRN
  Administered 2019-09-01: 08:00:00 1000 mL

## 2019-09-01 MED ORDER — PROPOFOL 500 MG/50ML IV EMUL
INTRAVENOUS | Status: DC | PRN
  Administered 2019-09-01: 50 ug/kg/min via INTRAVENOUS
  Administered 2019-09-01: 60 ug/kg/min via INTRAVENOUS

## 2019-09-01 MED ORDER — BACITRACIN ZINC 500 UNIT/GM EX OINT
TOPICAL_OINTMENT | CUTANEOUS | Status: AC
  Filled 2019-09-01: qty 28.35

## 2019-09-01 MED ORDER — PROPOFOL 10 MG/ML IV BOLUS
INTRAVENOUS | Status: AC
  Filled 2019-09-01: qty 40

## 2019-09-01 MED ORDER — MIDAZOLAM HCL 5 MG/5ML IJ SOLN
INTRAMUSCULAR | Status: DC | PRN
  Administered 2019-09-01: 1 mg via INTRAVENOUS
  Administered 2019-09-01: .5 mg via INTRAVENOUS

## 2019-09-01 MED ORDER — CHLORHEXIDINE GLUCONATE CLOTH 2 % EX PADS: 6.0000 | MEDICATED_PAD | Freq: Once | CUTANEOUS | Status: DC

## 2019-09-01 MED ORDER — LIDOCAINE-EPINEPHRINE 1 %-1:100000 IJ SOLN
INTRAMUSCULAR | Status: AC
  Filled 2019-09-01: qty 1

## 2019-09-01 MED ORDER — LACTATED RINGERS IV SOLN
INTRAVENOUS | Status: DC | PRN
  Administered 2019-09-01: 07:00:00 via INTRAVENOUS

## 2019-09-01 MED ORDER — LIDOCAINE-EPINEPHRINE 0.5 %-1:200000 IJ SOLN
INTRAMUSCULAR | Status: DC | PRN
  Administered 2019-09-01: 24 mL

## 2019-09-01 MED ORDER — ONDANSETRON HCL 4 MG/2ML IJ SOLN
INTRAMUSCULAR | Status: DC | PRN
  Administered 2019-09-01: 4 mg via INTRAVENOUS

## 2019-09-01 MED ORDER — MIDAZOLAM HCL 2 MG/2ML IJ SOLN
INTRAMUSCULAR | Status: AC
  Filled 2019-09-01: qty 2

## 2019-09-01 MED ORDER — LIDOCAINE-EPINEPHRINE 0.5 %-1:200000 IJ SOLN
INTRAMUSCULAR | Status: AC
  Filled 2019-09-01: qty 1

## 2019-09-01 MED ORDER — STERILE WATER FOR IRRIGATION IR SOLN
Status: DC | PRN
  Administered 2019-09-01: 1000 mL

## 2019-09-01 MED ORDER — FENTANYL CITRATE (PF) 250 MCG/5ML IJ SOLN
INTRAMUSCULAR | Status: AC
  Filled 2019-09-01: qty 5

## 2019-09-01 SURGICAL SUPPLY — 50 items
BLADE SURG 15 STRL LF DISP TIS (BLADE) IMPLANT
BLADE SURG 15 STRL SS (BLADE)
BNDG GAUZE ELAST 4 BULKY (GAUZE/BANDAGES/DRESSINGS) IMPLANT
CANISTER SUCT 3000ML PPV (MISCELLANEOUS) IMPLANT
CLEANER TIP ELECTROSURG 2X2 (MISCELLANEOUS) ×3 IMPLANT
CONT SPEC 4OZ CLIKSEAL STRL BL (MISCELLANEOUS) ×3 IMPLANT
COVER SURGICAL LIGHT HANDLE (MISCELLANEOUS) ×3 IMPLANT
COVER WAND RF STERILE (DRAPES) IMPLANT
DERMABOND ADVANCED (GAUZE/BANDAGES/DRESSINGS) ×2
DERMABOND ADVANCED .7 DNX12 (GAUZE/BANDAGES/DRESSINGS) ×1 IMPLANT
DRAIN PENROSE 1/4X12 LTX STRL (WOUND CARE) IMPLANT
DRAPE HALF SHEET 40X57 (DRAPES) IMPLANT
DRSG EMULSION OIL 3X3 NADH (GAUZE/BANDAGES/DRESSINGS) IMPLANT
ELECT COATED BLADE 2.86 ST (ELECTRODE) ×3 IMPLANT
ELECT REM PT RETURN 9FT ADLT (ELECTROSURGICAL) ×3
ELECTRODE REM PT RTRN 9FT ADLT (ELECTROSURGICAL) ×1 IMPLANT
GAUZE SPONGE 4X4 12PLY STRL (GAUZE/BANDAGES/DRESSINGS) IMPLANT
GLOVE ECLIPSE 8.0 STRL XLNG CF (GLOVE) ×3 IMPLANT
GOWN STRL REUS W/ TWL LRG LVL3 (GOWN DISPOSABLE) ×2 IMPLANT
GOWN STRL REUS W/ TWL XL LVL3 (GOWN DISPOSABLE) ×1 IMPLANT
GOWN STRL REUS W/TWL LRG LVL3 (GOWN DISPOSABLE) ×4
GOWN STRL REUS W/TWL XL LVL3 (GOWN DISPOSABLE) ×2
HIBICLENS CHG 4% 4OZ BTL (MISCELLANEOUS) IMPLANT
KIT BASIN OR (CUSTOM PROCEDURE TRAY) ×3 IMPLANT
KIT TURNOVER KIT B (KITS) ×3 IMPLANT
LOCATOR NERVE 3 VOLT (DISPOSABLE) IMPLANT
NEEDLE FILTER BLUNT 18X 1/2SAF (NEEDLE)
NEEDLE FILTER BLUNT 18X1 1/2 (NEEDLE) IMPLANT
NEEDLE HYPO 25GX1X1/2 BEV (NEEDLE) ×6 IMPLANT
NS IRRIG 1000ML POUR BTL (IV SOLUTION) ×3 IMPLANT
PENCIL BUTTON HOLSTER BLD 10FT (ELECTRODE) IMPLANT
POSITIONER HEAD DONUT 9IN (MISCELLANEOUS) IMPLANT
RUBBERBAND STERILE (MISCELLANEOUS) IMPLANT
SPECIMEN JAR SMALL (MISCELLANEOUS) ×3 IMPLANT
STAPLER VISISTAT 35W (STAPLE) IMPLANT
SUT CHROMIC 3 0 PS 2 (SUTURE) IMPLANT
SUT CHROMIC 4 0 P 3 18 (SUTURE) IMPLANT
SUT ETHILON 6 0 P 1 (SUTURE) IMPLANT
SUT SILK 3 0 (SUTURE) ×2
SUT SILK 3-0 18XBRD TIE 12 (SUTURE) ×1 IMPLANT
SUT VIC AB 3-0 SH 27 (SUTURE) ×2
SUT VIC AB 3-0 SH 27X BRD (SUTURE) ×1 IMPLANT
SUT VIC AB 4-0 PS2 18 (SUTURE) ×3 IMPLANT
SWAB COLLECTION DEVICE MRSA (MISCELLANEOUS) IMPLANT
SWAB CULTURE ESWAB REG 1ML (MISCELLANEOUS) IMPLANT
SYR CONTROL 10ML LL (SYRINGE) ×3 IMPLANT
SYR TB 1ML LUER SLIP (SYRINGE) IMPLANT
TOWEL GREEN STERILE FF (TOWEL DISPOSABLE) ×3 IMPLANT
TRAY ENT MC OR (CUSTOM PROCEDURE TRAY) ×3 IMPLANT
WATER STERILE IRR 1000ML POUR (IV SOLUTION) ×3 IMPLANT

## 2019-09-01 NOTE — Anesthesia Postprocedure Evaluation (Signed)
Anesthesia Post Note  Patient: [name] [name]  Procedure(s) Performed: CERVICAL LYMPH NODE BIOPSY (Right Neck)     Patient location during evaluation: PACU Anesthesia Type: MAC Level of consciousness: awake and alert Pain management: pain level controlled Vital Signs Assessment: post-procedure vital signs reviewed and stable Respiratory status: spontaneous breathing, nonlabored ventilation, respiratory function stable and patient connected to nasal cannula oxygen Cardiovascular status: stable and blood pressure returned to baseline Postop Assessment: no apparent nausea or vomiting Anesthetic complications: no    Last Vitals:  Vitals:   09/01/19 0904 09/01/19 0914  BP: 136/70 138/70  Pulse: 61 61  Resp: 14 16  Temp: (!) 36.1 C   SpO2: 96% 97%    Last Pain:  Vitals:   09/01/19 0623  TempSrc: Oral  PainSc: 0-No pain   Pain Goal:                   Brylee Mcgreal

## 2019-09-01 NOTE — Transfer of Care (Signed)
Immediate Anesthesia Transfer of Care Note  Patient: [name] [name]  Procedure(s) Performed: CERVICAL LYMPH NODE BIOPSY (Right Neck)  Patient Location: PACU  Anesthesia Type:MAC  Level of Consciousness: awake, alert , oriented and sedated  Airway & Oxygen Therapy: Patient Spontanous Breathing and Patient connected to nasal cannula oxygen  Post-op Assessment: Report given to RN, Post -op Vital signs reviewed and stable and Patient moving all extremities  Post vital signs: Reviewed and stable  Last Vitals:  Vitals Value Taken Time  BP 116/68 09/01/19 0849  Temp 36.4 C 09/01/19 0849  Pulse 61 09/01/19 0850  Resp 16 09/01/19 0850  SpO2 95 % 09/01/19 0850  Vitals shown include unvalidated device data.  Last Pain:  Vitals:   09/01/19 0623  TempSrc: Oral  PainSc: 0-No pain         Complications: No apparent anesthesia complications

## 2019-09-01 NOTE — Interval H&P Note (Signed)
History and Physical Interval Note:  09/01/2019 7:38 AM  [name] [name]  has presented today for surgery, with the diagnosis of DIFFUSE LYMPHADENOPATHY.  The various methods of treatment have been discussed with the patient and family. After consideration of risks, benefits and other options for treatment, the patient has consented to  Procedure(s): CERVICAL LYMPH NODE BIOPSY (Right) as a surgical intervention.  The patient's history has been re-reviewed, patient re-examined, no change in status, stable for surgery.  I have re-reviewed the patient's chart and labs.  Questions were answered to the patient's satisfaction.     Ileene Hutchinson [facility]

## 2019-09-01 NOTE — Discharge Instructions (Signed)
OK to shower beginning tomorrow Sleep with head elevated 3-4 nights Ice pack x 24 hrs as tolerated No strenuous activity x 2 weeks Call for bleeding or signs of infection Recheck my office 2 weeks. [phone] for an appointment I will call with the Pathology report this week. You may peel up the edges of the skin glue as they come loose. Alternate tylenol and ibuprofen for pain.  If you need something stronger, please call our office.

## 2019-09-01 NOTE — Op Note (Signed)
09/01/2019  8:49 AM    Wynetta Emery  [id number]   Pre-Op Dx: Diffuse lymphadenopathy  Post-op Dx: Same  Proc: Excisional biopsy, right supraclavicular node  Surg:  [name] T MD  Anes: MAC  EBL: Minimal  Comp: None  Findings: Multiple matted and adherent lymph nodes in the supraclavicular triangle.  Procedure: With the patient in a comfortable supine position, intravenous anesthesia was administered.  A routine surgical timeout was obtained.  1/2% Xylocaine with 1-200,000 epinephrine, 18 cc total was infiltrated around the mass of choice.  Several minutes were allowed for this to take effect.  A sterile preparation and draping of the neck was accomplished.  A 7 cm incision was sharply executed in a relaxed skin tension line.  This was carried through the skin, subcutaneous fat, and platysma muscle.  In the fatty supraclavicular tissues, the lymph node was palpated.  It was excised with blunt and sharp dissection.  Branches of the brachial plexus were identified and avoided.  Small veins were controlled with bipolar cautery.  I cut across the mass before pursuing it too deeply into the lower jugular chain.  Hemostasis was observed.  No chyle leakage was noted.  The wound was irrigated.  It was observed for several minutes with persistent hemostasis.  At this point the procedure was completed.  The patient was returned to anesthesia, awakened, and transferred to recovery in stable condition.  Dispo:   PACU to home  Plan: Ice, elevation, analgesia.  Routine wound care.  I will call him with the pathology report.  Tyson Alias MD

## 2019-09-02 ENCOUNTER — Encounter (HOSPITAL_COMMUNITY): Payer: Self-pay | Admitting: Otolaryngology

## 2019-09-05 ENCOUNTER — Other Ambulatory Visit: Payer: Self-pay | Admitting: Hematology

## 2019-09-05 ENCOUNTER — Encounter: Payer: Self-pay | Admitting: *Deleted

## 2019-09-05 NOTE — Progress Notes (Signed)
Reached out to Madaline Savage to introduce myself as the office RN Navigator and explain our new patient process. Reviewed the reason for their referral and scheduled their new patient appointment along with labs. Provided address and directions to the office including call back phone number. Reviewed with patient any concerns they may have or any possible barriers to attending their appointment.   Informed patient about my role as a navigator and that I will meet with them prior to their New Patient appointment and more fully discuss what services I can provide. At this time patient has no further questions or needs.

## 2019-09-08 ENCOUNTER — Other Ambulatory Visit: Payer: Self-pay | Admitting: Hematology

## 2019-09-08 DIAGNOSIS — C8590 Non-Hodgkin lymphoma, unspecified, unspecified site: Secondary | ICD-10-CM

## 2019-09-08 DIAGNOSIS — C8440 Peripheral T-cell lymphoma, not classified, unspecified site: Secondary | ICD-10-CM | POA: Insufficient documentation

## 2019-09-08 NOTE — Progress Notes (Signed)
[facility] NOTE  Patient Care Team: Colon Branch, MD as PCP - General [name], [name], MD as Consulting Physician (Cardiology) [name], MD as Consulting Physician (Gastroenterology) [name], MD as Consulting Physician (Orthopedic Surgery) [name], MD as Consulting Physician (Physical Medicine and Rehabilitation) Luretha Rued, RN as [place] Management [name], MD as Consulting Physician (Otolaryngology)  HEME/ONC OVERVIEW: 1. 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 Erik Obey of ENT for evaluation of enlarging left neck mass, for which he underwent FNA in 07/2019 that was nondiagnostic.  CT neck showed bilateral cervical adenopathy, R > L, the largest of which measured approximately 4 x 2 cm.  CT chest also showed extensive LN involvement in the mediastinum and upper retroperitoneum, also largest RP LN near the IVC measuring ~5cm.  He underwent left cervical excisional LN biopsy, 09/01/2019, results pending.   -I reviewed the imaging results in detail with the patient -In light of the preliminary pathology suggestive of T-cell lymphoma, I have ordered baseline infectious studies, including HIV, Hep B/C serologies, EBV PCR, and HTLV-1/2  -Furthermore, I have ordered PET scan to assess the extent of  LN involvement, and CT-guided bone marrow biopsy to complete the staging studies  -Finally, I have ordered baseline TTE to assess cardiac function and port placement, in anticipation of anthracycline  -Pending the work-up above, we will determine the next steps and treatment options  Normocytic anemia -Hgb 12.4 today, stable -Patient denies any symptoms of bleeding -Bone marrow biopsy as above -We will monitor it for now   Hyponatremia -Na 131 today; review of labs showed mild hyponatremia (Na 129-133) since 05/2019 -Patient is asymptomatic -I encouraged the patient to maintain adequate nutrition and hydration, including adding Glucerna -We will monitor it for now   Intermittent nausea -Etiology unclear -Imaging studies as above -I have ordered PRN Zofran   Orders Placed This Encounter  Procedures  . PET, initial (skull base to thigh)    Standing Status:   Future    Standing Expiration Date:   09/08/2020    Order Specific Question:   If indicated for the ordered procedure, I authorize the administration of a radiopharmaceutical per Radiology protocol    Answer:   Yes    Order Specific Question:   Preferred imaging location?    Answer:   [facility]    Order Specific Question:   Radiology Contrast Protocol - do NOT remove file path    Answer:   \\[email or web address]\Radiant\[email or web address].pdf  . IR port placement [facility])    Standing Status:   Future    Standing Expiration Date:   11/08/2020    Order Specific Question:   Reason for Exam (SYMPTOM  OR DIAGNOSIS REQUIRED)    Answer:   T-cell lymphoma    Order Specific Question:   Preferred Imaging Location?    Answer:   [facility]  . CT-gudied biopsy  Standing Status:   Future    Standing Expiration Date:   09/08/2020    Order Specific Question:   Lab orders requested (DO NOT place separate lab orders, these will be automatically ordered during procedure specimen collection):    Answer:   Surgical Pathology     Order Specific Question:   Lab orders requested (DO NOT place separate lab orders, these will be automatically ordered during procedure specimen collection):    Answer:   Other    Order Specific Question:   Reason for Exam (SYMPTOM  OR DIAGNOSIS REQUIRED)    Answer:   T-cell lymphoma. Need morphology, flow, karyotype, conventional cytogenetics    Order Specific Question:   Preferred imaging location?    Answer:   [facility]    Order Specific Question:   Radiology Contrast Protocol - do NOT remove file path    Answer:   \\[email or web address]\Radiant\CTProtocols.pdf  . CT BONE MARROW BIOPSY & ASPIRATION    Standing Status:   Future    Standing Expiration Date:   12/09/2020    Order Specific Question:   Reason for Exam (SYMPTOM  OR DIAGNOSIS REQUIRED)    Answer:   Myeloma bone marrow biopsy staging    Order Specific Question:   Preferred imaging location?    Answer:   [facility]    Order Specific Question:   Radiology Contrast Protocol - do NOT remove file path    Answer:   \\[email or web address]\Radiant\CTProtocols.pdf  . ECHOCARDIOGRAM COMPLETE    Standing Status:   Future    Standing Expiration Date:   12/09/2020    Order Specific Question:   Where should this test be performed    Answer:   [facility]    Order Specific Question:   Perflutren DEFINITY (image enhancing agent) should be administered unless hypersensitivity or allergy exist    Answer:   Administer Perflutren    Order Specific Question:   Reason for exam-Echo    Answer:   Chemo  V67.2 / Z09   All questions were answered. The patient knows to call the clinic with any problems, questions or concerns.  Return in 2 weeks for labs, imaging and pathology results.   Tish Men, MD 09/09/2019 2:54 PM  CHIEF COMPLAINTS/PURPOSE OF CONSULTATION:  "I just want to get better"  HISTORY OF PRESENTING ILLNESS:  [name] [name] 78 y.o. male is here because of extensive lymphadenopathy, suspicious for  lymphoma.  Patient first developed enlarged left mass in early-06/2019, non-tender, with some concurrent right-sided neck swelling that seemed to come and go.  He also reports associated weight loss of approximately 15 pounds, mild sore throat, and intermittent nausea without vomiting.  He denies any other constitutional symptoms.   He was referred to Dr. Erik Obey of ENT for evaluation of enlarging left neck mass, for which he underwent FNA in 07/2019 that was nondiagnostic.  CT neck showed bilateral cervical adenopathy, R > L, the largest of which measured approximately 4 x 2 cm.  CT chest also showed extensive LN involvement in the mediastinum and upper retroperitoneum, also largest RP LN near the IVC measuring ~5cm.  He underwent left cervical excisional LN biopsy, 09/01/2019, results pending.  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   REVIEW OF SYSTEMS:   Constitutional: ( - ) fevers, ( - )  chills , ( - ) night sweats Eyes: ( - ) blurriness of vision, ( - ) double vision, ( - ) watery eyes Ears,  nose, mouth, throat, and face: ( - ) mucositis, ( + ) sore throat Respiratory: ( - ) cough, ( - ) dyspnea, ( - ) wheezes Cardiovascular: ( - ) palpitation, ( - ) chest discomfort, ( - ) lower extremity swelling Gastrointestinal:  ( + ) nausea, ( - ) heartburn, ( - ) change in bowel habits Skin: ( - ) abnormal skin rashes Lymphatics: ( - ) new lymphadenopathy, ( - ) easy bruising Neurological: ( - ) numbness, ( - ) tingling, ( - ) new weaknesses Behavioral/Psych: ( - ) mood change, ( - ) new changes  All other systems were reviewed with the patient and are negative.  I have reviewed his chart and materials related to his cancer extensively and collaborated history with the patient. 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 This may be infectious/inflammatory or neoplastic and tissue sampling is recommended.   08/25/2019 Imaging   CT chest: IMPRESSION: Multiple areas of adenopathy involving the lower neck, left axilla, subcarinal station, and largest areas of nodal masses throughout the visualized retroperitoneum. Splenomegaly.   Scattered inflammatory stranding adjacent to the distal pancreatic body-tail. 11 cm lobulated cyst in the left kidney.   1.6 and 1.1 cm irregular nodules in the medial basilar segment of the right lower lobe.   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 Arthritis   . Complication of anesthesia     had spinal with knee surgeries  -"heart rate too low" for general  . Detached retina, left    L eye normal vision, reports that he has had a retina procedure in a doctor's office at [facility]   . Diabetes mellitus without complication ([place])   . Dysrhythmia    slight irregular rate  . GERD (gastroesophageal reflux disease)    hx no problems now  . H/O echocardiogram 2014  . History of hiatal hernia    ?20 yrs ago  . Hyperopia 2016  . Hypertension   . Lumbar stenosis with neurogenic claudication   . 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 of L eye,   . Prediabetes   . Presbyopia OU 2016    SURGICAL HISTORY: Past Surgical History:  Procedure Laterality Date  . COLONOSCOPY    . HERNIA REPAIR     bilateral inguinal hernia  . HERNIA REPAIR     umbilical  . JOINT REPLACEMENT  2009   right knee  . LUMBAR LAMINECTOMY/DECOMPRESSION MICRODISCECTOMY N/A 06/06/2017   Procedure: Decompression L3-5, insitu fusion L3-5 ;  Surgeon: Melina Schools, MD;  Location: [facility];  Service: Orthopedics;  Laterality: N/A;  . LYMPH NODE BIOPSY Right 09/01/2019   Procedure: CERVICAL LYMPH NODE BIOPSY;  Surgeon: [name], MD;   Location: [facility];  Service: ENT;  Laterality: Right;  . MIDDLE EAR SURGERY Right    ear -patched hole in ear drum  . TOTAL KNEE ARTHROPLASTY  09/10/2012   Procedure: TOTAL KNEE ARTHROPLASTY;  Surgeon: Tobi Bastos, MD;  Location: [facility];  Service: Orthopedics;  Laterality: Left;  . TOTAL SHOULDER ARTHROPLASTY Right 06/22/2016   Procedure: RIGHT TOTAL SHOULDER ARTHROPLASTY;  Surgeon: [name], MD;  Location: [facility];  Service: Orthopedics;  Laterality: Right;    SOCIAL  HISTORY: Social History   Socioeconomic History  . Marital status: Married    Spouse name: Not on file  . Number of children: 4  . Years of education: Not on file  . Highest education level: Not on file  Occupational History  . Occupation: Audiological scientist-- retired 2010  Social Needs  . Financial resource strain: Not on file  . Food insecurity    Worry: Not on file    Inability: Not on file  . Transportation needs    Medical: Not on file    Non-medical: Not on file  Tobacco Use  . Smoking status: Former Smoker    Packs/day: 0.50    Years: 2.00    Pack years: 1.00    Types: Cigarettes    Quit date: 06/09/1966    Years since quitting: 53.2  . Smokeless tobacco: Never Used  Substance and Sexual Activity  . Alcohol use: No    Alcohol/week: 0.0 standard drinks    Comment: none  . Drug use: No  . Sexual activity: Yes    Partners: Female  Lifestyle  . Physical activity    Days per week: Not on file    Minutes per session: Not on file  . Stress: Not on file  Relationships  . Social Herbalist on phone: Not on file    Gets together: Not on file    Attends religious service: Not on file    Active member of club or organization: Not on file    Attends meetings of clubs or organizations: Not on file    Relationship status: Not on file  . Intimate partner violence    Fear of current or ex partner: Not on file    Emotionally abused: Not on file    Physically abused: Not on file    Forced  sexual activity: Not on file  Other Topics Concern  . Not on file  Social History Narrative   married, lives w/ second wife, 2 living children, lost 2 kids     Has a small dog    FAMILY HISTORY: Family History  Problem Relation Age of Onset  . Diabetes Brother   . Colon cancer Brother 52       dx in 2018  . Hypertension Sister   . Prostate cancer Neg Hx   . Stroke Neg Hx   . CAD Neg Hx     ALLERGIES:  is allergic to doxycycline.  MEDICATIONS:  Current Outpatient Medications  Medication Sig Dispense Refill  . aspirin 81 MG tablet Take 81 mg by mouth daily.    . carvedilol (COREG) 12.5 MG tablet Take 1 tablet (12.5 mg total) by mouth 2 (two) times daily with a meal. 60 tablet 6  . chlorthalidone (HYGROTON) 25 MG tablet Take 0.5 tablets (12.5 mg total) by mouth daily. 15 tablet 5  . hydrochlorothiazide (HYDRODIURIL) 25 MG tablet Take 25 mg by mouth daily.    Marland Kitchen ketoconazole (NIZORAL) 2 % cream Apply 1 application topically daily. (Patient taking differently: Apply 1 application topically daily as needed for irritation. ) 30 g 0  . losartan (COZAAR) 100 MG tablet Take 1 tablet (100 mg total) by mouth daily. 90 tablet 1  . metFORMIN (GLUCOPHAGE) 500 MG tablet Take 1 tablet (500 mg total) by mouth 2 (two) times daily with a meal. (Patient taking differently: Take 500 mg by mouth daily with breakfast. ) 180 tablet 1  . Omega-3 Fatty Acids (FISH OIL) 500 MG CAPS  Take 1 capsule by mouth daily.    Marland Kitchen omeprazole (PRILOSEC) 20 MG capsule Take 1 capsule (20 mg total) by mouth daily. (Patient taking differently: Take 20 mg by mouth 2 (two) times daily before a meal. ) 90 capsule 3  . potassium chloride (KLOR-CON M10) 10 MEQ tablet Take 1 tablet (10 mEq total) by mouth daily. 90 tablet 1  . simvastatin (ZOCOR) 80 MG tablet Take 0.5 tablets (40 mg total) by mouth daily. 45 tablet 1   No current facility-administered medications for this visit.     PHYSICAL EXAMINATION: ECOG PERFORMANCE STATUS:  2 - Symptomatic, <50% confined to bed  Vitals:   09/09/19 1335  BP: 131/62  Pulse: 70  Resp: 16  Temp: (!) 96.2 F (35.7 C)  SpO2: 99%   Filed Weights   09/09/19 1335  Weight: 290 lb 12 oz (131.9 kg)    GENERAL: alert, no distress and comfortable, elderly appearing  SKIN: skin color, texture, turgor are normal, no rashes or significant lesions EYES: conjunctiva are pink and non-injected, sclera clear OROPHARYNX: no exudate, no erythema; lips, buccal mucosa, and tongue normal  NECK: supple, non-tender LYMPH:  Bulky R cervical adenopathy, at least 5-6cm, firm, non-tender, shotty left supraclavicular lymphadenopathy, bulky left axillary lymphadenopathy  LUNGS: clear to auscultation with normal breathing effort HEART: regular rate & rhythm, no murmurs, 1+ bilateral lower extremity edema ABDOMEN: soft, non-tender, non-distended, normal bowel sounds Musculoskeletal: no cyanosis of digits and no clubbing  PSYCH: alert & oriented x 3, slightly slowed speech  LABORATORY DATA:  I have reviewed the data as listed Lab Results  Component Value Date   WBC 9.6 09/09/2019   HGB 12.4 (L) 09/09/2019   HCT 37.5 (L) 09/09/2019   MCV 84.1 09/09/2019   PLT 361 09/09/2019   Lab Results  Component Value Date   NA 131 (L) 09/09/2019   K 4.2 09/09/2019   CL 96 (L) 09/09/2019   CO2 24 09/09/2019    RADIOGRAPHIC STUDIES: I have personally reviewed the radiological images as listed and agreed with the findings in the report. Ct Soft Tissue Neck W Contrast  Result Date: 08/25/2019 CLINICAL DATA:  Right neck swelling. EXAM: CT NECK WITH CONTRAST TECHNIQUE: Multidetector CT imaging of the neck was performed using the standard protocol following the bolus administration of intravenous contrast. CONTRAST:  19m ISOVUE-300 IOPAMIDOL (ISOVUE-300) INJECTION 61% COMPARISON:  None. FINDINGS: Pharynx and larynx: There is infiltration of the superficial and deep subcutaneous fat of the right neck. There are  multiple nonenlarged and enlarged lymph nodes at levels 2-5 as well as in the supraclavicular region. The largest, a level 2 node, measures 4.3 x 2.3 cm. There is ill-defined low-attenuation within some nodes, which could reflect cystic change or necrosis. Much less pronounced changes are also present on the left. No pharyngeal or laryngeal mass identified. Palatine tonsillar calcifications are incidentally noted. Salivary glands: Unremarkable. Thyroid: Mildly enlarged.  Otherwise unremarkable. Lymph nodes: As above. Vascular: Not well opacified. Limited intracranial: No abnormal enhancement. Visualized orbits: Unremarkable. Mastoids and visualized paranasal sinuses: Aerated. Skeleton: Multilevel degenerative changes of the cervical spine. Upper chest: No apical lung mass. Other: None. IMPRESSION: Right greater than left cervical and supraclavicular adenopathy with some nodes demonstrating potential cystic change or necrosis. This may be infectious/inflammatory or neoplastic and tissue sampling is recommended. Electronically Signed   By: PMacy MisM.D.   On: 08/25/2019 14:14   Ct Chest W Contrast  Result Date: 08/25/2019 CLINICAL DATA:  Neck swelling EXAM:  CT CHEST WITH CONTRAST TECHNIQUE: Multidetector CT imaging of the chest was performed during intravenous contrast administration. CONTRAST:  58m ISOVUE-300 IOPAMIDOL (ISOVUE-300) INJECTION 61% COMPARISON:  None. FINDINGS: Cardiovascular: Normal heart size. Dense coronary calcifications. No pericardial effusion. Mediastinum/Nodes: 3 cm subcarinal lymph node. Lower cervical-supraclavicular enlarged nodes are present bilaterally measuring up 1.6 cm. Lungs/Pleura: 1.6 and 1.1 cm irregular nodules in the medial basilar segment of the right lower lobe. Small amount of adjacent pleural thickening is present. No consolidation or pleural effusion. Small areas of bilateral apical paraseptal emphysema. Upper Abdomen: Bulky adenopathy in the upper retroperitoneum  with largest node measuring 4 point 9 cm adjacent to the IVC and multiple nodal masses measuring greater than 3 cm. Splenomegaly measuring 14.2 cm in length. Scattered inflammatory stranding adjacent to the distal pancreatic body-tail. 11 cm lobulated cyst in the left kidney. Musculoskeletal: 2.8 cm enlarged node in the left axilla. IMPRESSION: Multiple areas of adenopathy involving the lower neck, left axilla, subcarinal station, and largest areas of nodal masses throughout the visualized retroperitoneum. Splenomegaly. Scattered inflammatory stranding adjacent to the distal pancreatic body-tail. 11 cm lobulated cyst in the left kidney. 1.6 and 1.1 cm irregular nodules in the medial basilar segment of the right lower lobe. Electronically Signed   By: MMancel ParsonsM.D.   On: 08/25/2019 12:49    PATHOLOGY: I have reviewed the pathology reports as documented in the oncologist history.

## 2019-09-09 ENCOUNTER — Inpatient Hospital Stay: Payer: HMO | Attending: Hematology | Admitting: Hematology

## 2019-09-09 ENCOUNTER — Encounter: Payer: Self-pay | Admitting: Hematology

## 2019-09-09 ENCOUNTER — Inpatient Hospital Stay: Payer: HMO

## 2019-09-09 ENCOUNTER — Other Ambulatory Visit: Payer: Self-pay

## 2019-09-09 ENCOUNTER — Encounter: Payer: Self-pay | Admitting: *Deleted

## 2019-09-09 VITALS — BP 131/62 | HR 70 | Temp 96.2°F | Resp 16 | Ht 72.0 in | Wt 290.8 lb

## 2019-09-09 DIAGNOSIS — R110 Nausea: Secondary | ICD-10-CM | POA: Insufficient documentation

## 2019-09-09 DIAGNOSIS — R634 Abnormal weight loss: Secondary | ICD-10-CM | POA: Insufficient documentation

## 2019-09-09 DIAGNOSIS — Z800 Family history of malignant neoplasm of digestive organs: Secondary | ICD-10-CM | POA: Diagnosis not present

## 2019-09-09 DIAGNOSIS — R590 Localized enlarged lymph nodes: Secondary | ICD-10-CM | POA: Insufficient documentation

## 2019-09-09 DIAGNOSIS — D649 Anemia, unspecified: Secondary | ICD-10-CM | POA: Diagnosis not present

## 2019-09-09 DIAGNOSIS — E871 Hypo-osmolality and hyponatremia: Secondary | ICD-10-CM | POA: Insufficient documentation

## 2019-09-09 DIAGNOSIS — C8590 Non-Hodgkin lymphoma, unspecified, unspecified site: Secondary | ICD-10-CM

## 2019-09-09 DIAGNOSIS — R070 Pain in throat: Secondary | ICD-10-CM

## 2019-09-09 DIAGNOSIS — Z87891 Personal history of nicotine dependence: Secondary | ICD-10-CM | POA: Insufficient documentation

## 2019-09-09 LAB — CBC WITH DIFFERENTIAL (CANCER CENTER ONLY)
Abs Immature Granulocytes: 0.05 10*3/uL (ref 0.00–0.07)
Basophils Absolute: 0.1 10*3/uL (ref 0.0–0.1)
Basophils Relative: 1 %
Eosinophils Absolute: 0.8 10*3/uL — ABNORMAL HIGH (ref 0.0–0.5)
Eosinophils Relative: 8 %
HCT: 37.5 % — ABNORMAL LOW (ref 39.0–52.0)
Hemoglobin: 12.4 g/dL — ABNORMAL LOW (ref 13.0–17.0)
Immature Granulocytes: 1 %
Lymphocytes Relative: 5 %
Lymphs Abs: 0.5 10*3/uL — ABNORMAL LOW (ref 0.7–4.0)
MCH: 27.8 pg (ref 26.0–34.0)
MCHC: 33.1 g/dL (ref 30.0–36.0)
MCV: 84.1 fL (ref 80.0–100.0)
Monocytes Absolute: 1 10*3/uL (ref 0.1–1.0)
Monocytes Relative: 10 %
Neutro Abs: 7.2 10*3/uL (ref 1.7–7.7)
Neutrophils Relative %: 75 %
Platelet Count: 361 10*3/uL (ref 150–400)
RBC: 4.46 MIL/uL (ref 4.22–5.81)
RDW: 14.6 % (ref 11.5–15.5)
WBC Count: 9.6 10*3/uL (ref 4.0–10.5)
nRBC: 0 % (ref 0.0–0.2)

## 2019-09-09 LAB — CMP (CANCER CENTER ONLY)
ALT: 8 U/L (ref 0–44)
AST: 8 U/L — ABNORMAL LOW (ref 15–41)
Albumin: 3.7 g/dL (ref 3.5–5.0)
Alkaline Phosphatase: 70 U/L (ref 38–126)
Anion gap: 11 (ref 5–15)
BUN: 17 mg/dL (ref 8–23)
CO2: 24 mmol/L (ref 22–32)
Calcium: 9.5 mg/dL (ref 8.9–10.3)
Chloride: 96 mmol/L — ABNORMAL LOW (ref 98–111)
Creatinine: 1.01 mg/dL (ref 0.61–1.24)
GFR, Est AFR Am: >60 mL/min (ref >60)
GFR, Estimated: >60 mL/min (ref >60)
Glucose, Bld: 135 mg/dL — ABNORMAL HIGH (ref 70–99)
Potassium: 4.2 mmol/L (ref 3.5–5.1)
Sodium: 131 mmol/L — ABNORMAL LOW (ref 135–145)
Total Bilirubin: 0.6 mg/dL (ref 0.3–1.2)
Total Protein: 7 g/dL (ref 6.5–8.1)

## 2019-09-09 LAB — SAVE SMEAR(SSMR), FOR PROVIDER SLIDE REVIEW

## 2019-09-09 LAB — LACTATE DEHYDROGENASE: LDH: 289 U/L — ABNORMAL HIGH (ref 98–192)

## 2019-09-09 MED ORDER — ONDANSETRON HCL 8 MG PO TABS: 8.0000 mg | ORAL_TABLET | Freq: Three times a day (TID) | ORAL | 0 refills | Status: DC | PRN

## 2019-09-09 NOTE — Progress Notes (Signed)
.  Initial RN Navigator Patient Visit  Name: SHAWN CARATTINI Date of Referral : 09/05/2019 Diagnosis: T-Cell Lymphoma  Met with patient prior to their visit with MD. Hanley Seamen patient "Your Patient Navigator" handout which explains my role, areas in which I am able to help, and all the contact information for myself and the office. Also gave patient MD and Navigator business card. Reviewed with patient the general overview of expected course after initial diagnosis and time frame for all steps to be completed.  New patient packet given to patient which includes: orientation to office and staff; campus directory; education on My Chart and Advance Directives; and patient centered education on lymphoma.   Patient completed visit with Dr. Maylon Peppers  Revisited with patient after MD visit. Patient will need  PET - 09/17/2019 Port Placement - 09/16/2019 Echo - 09/12/2019 BM Bx - 09/19/2019  A calendar was printed with detail instructions for each appointment, and mailed to the patient's home. Also reviewed all instructions over the phone.   Patient understands all follow up procedures and expectations. They have my number to reach out for any further clarification or additional needs.

## 2019-09-09 NOTE — Progress Notes (Signed)
Spoke to [employer] at [facility] pathology. Request placed for CD30 stain to be added to specimen from 09/01/2019 per Dr Lorette Ang request.

## 2019-09-10 LAB — HCV COMMENT:

## 2019-09-10 LAB — HEPATITIS B CORE ANTIBODY, TOTAL: Hep B Core Total Ab: NEGATIVE — AB

## 2019-09-10 LAB — HEPATITIS B SURFACE ANTIBODY,QUALITATIVE: Hep B S Ab: NONREACTIVE

## 2019-09-10 LAB — HEPATITIS B SURFACE ANTIGEN: Hepatitis B Surface Ag: NEGATIVE — AB

## 2019-09-10 LAB — EPSTEIN BARR VRS(EBV DNA BY PCR)
EBV DNA QN by PCR: 3233 copies/mL
log10 EBV DNA Qn PCR: 3.51 log10 copy/mL

## 2019-09-10 LAB — HTLV I+II ANTIBODIES, (EIA), BLD: HTLV I/II Ab: NEGATIVE

## 2019-09-10 LAB — HEPATITIS C ANTIBODY (REFLEX): HCV Ab: <0.1 s/co ratio (ref 0.0–0.9)

## 2019-09-10 LAB — HIV ANTIBODY (ROUTINE TESTING W REFLEX): HIV Screen 4th Generation wRfx: NONREACTIVE — AB

## 2019-09-12 ENCOUNTER — Other Ambulatory Visit: Payer: Self-pay

## 2019-09-12 ENCOUNTER — Ambulatory Visit (HOSPITAL_BASED_OUTPATIENT_CLINIC_OR_DEPARTMENT_OTHER)
Admission: RE | Admit: 2019-09-12 | Discharge: 2019-09-12 | Disposition: A | Discharge status: Home / Self Care | Payer: HMO | Source: Ambulatory Visit | Attending: Hematology | Admitting: Hematology

## 2019-09-12 DIAGNOSIS — C8590 Non-Hodgkin lymphoma, unspecified, unspecified site: Secondary | ICD-10-CM | POA: Diagnosis not present

## 2019-09-12 MED ORDER — PERFLUTREN LIPID MICROSPHERE
1.0000 mL | INTRAVENOUS | Status: AC | PRN
  Administered 2019-09-12: 3 mL via INTRAVENOUS
  Filled 2019-09-12: qty 10

## 2019-09-12 NOTE — Progress Notes (Signed)
  Echocardiogram 2D Echocardiogram has been performed.  [name] [name] 09/12/2019, 12:21 PM

## 2019-09-15 ENCOUNTER — Ambulatory Visit: Payer: HMO | Admitting: Internal Medicine

## 2019-09-15 ENCOUNTER — Other Ambulatory Visit: Payer: Self-pay | Admitting: Radiology

## 2019-09-16 ENCOUNTER — Other Ambulatory Visit: Payer: Self-pay

## 2019-09-16 ENCOUNTER — Ambulatory Visit (HOSPITAL_COMMUNITY)
Admission: RE | Admit: 2019-09-16 | Discharge: 2019-09-16 | Disposition: A | Discharge status: Home / Self Care | Payer: HMO | Source: Ambulatory Visit | Attending: Hematology | Admitting: Hematology

## 2019-09-16 ENCOUNTER — Encounter (HOSPITAL_COMMUNITY): Payer: Self-pay

## 2019-09-16 ENCOUNTER — Telehealth: Payer: Self-pay

## 2019-09-16 DIAGNOSIS — Z7982 Long term (current) use of aspirin: Secondary | ICD-10-CM | POA: Insufficient documentation

## 2019-09-16 DIAGNOSIS — K219 Gastro-esophageal reflux disease without esophagitis: Secondary | ICD-10-CM | POA: Insufficient documentation

## 2019-09-16 DIAGNOSIS — Z87891 Personal history of nicotine dependence: Secondary | ICD-10-CM | POA: Diagnosis not present

## 2019-09-16 DIAGNOSIS — Z7984 Long term (current) use of oral hypoglycemic drugs: Secondary | ICD-10-CM | POA: Insufficient documentation

## 2019-09-16 DIAGNOSIS — Z79899 Other long term (current) drug therapy: Secondary | ICD-10-CM | POA: Diagnosis not present

## 2019-09-16 DIAGNOSIS — E119 Type 2 diabetes mellitus without complications: Secondary | ICD-10-CM | POA: Insufficient documentation

## 2019-09-16 DIAGNOSIS — C8590 Non-Hodgkin lymphoma, unspecified, unspecified site: Secondary | ICD-10-CM

## 2019-09-16 DIAGNOSIS — I10 Essential (primary) hypertension: Secondary | ICD-10-CM | POA: Diagnosis not present

## 2019-09-16 DIAGNOSIS — Z5111 Encounter for antineoplastic chemotherapy: Secondary | ICD-10-CM | POA: Diagnosis not present

## 2019-09-16 DIAGNOSIS — C8580 Other specified types of non-Hodgkin lymphoma, unspecified site: Secondary | ICD-10-CM | POA: Diagnosis not present

## 2019-09-16 DIAGNOSIS — C8281 Other types of follicular lymphoma, lymph nodes of head, face, and neck: Secondary | ICD-10-CM | POA: Insufficient documentation

## 2019-09-16 HISTORY — PX: IR IMAGING GUIDED PORT INSERTION: IMG5740

## 2019-09-16 LAB — CBC
HCT: 39.8 % (ref 39.0–52.0)
Hemoglobin: 12.9 g/dL — ABNORMAL LOW (ref 13.0–17.0)
MCH: 27.8 pg (ref 26.0–34.0)
MCHC: 32.4 g/dL (ref 30.0–36.0)
MCV: 85.8 fL (ref 80.0–100.0)
Platelets: 368 10*3/uL (ref 150–400)
RBC: 4.64 MIL/uL (ref 4.22–5.81)
RDW: 14.7 % (ref 11.5–15.5)
WBC: 10.9 10*3/uL — ABNORMAL HIGH (ref 4.0–10.5)
nRBC: 0 % (ref 0.0–0.2)

## 2019-09-16 LAB — GLUCOSE, CAPILLARY: Glucose-Capillary: 129 mg/dL — ABNORMAL HIGH (ref 70–99)

## 2019-09-16 LAB — PROTIME-INR
INR: 1.2 (ref 0.8–1.2)
Prothrombin Time: 14.6 seconds (ref 11.4–15.2)

## 2019-09-16 LAB — APTT: aPTT: 29 seconds (ref 24–36)

## 2019-09-16 MED ORDER — HEPARIN SOD (PORK) LOCK FLUSH 100 UNIT/ML IV SOLN
INTRAVENOUS | Status: AC
  Filled 2019-09-16: qty 5

## 2019-09-16 MED ORDER — HEPARIN SOD (PORK) LOCK FLUSH 100 UNIT/ML IV SOLN
INTRAVENOUS | Status: AC | PRN
  Administered 2019-09-16: 500 [IU] via INTRAVENOUS

## 2019-09-16 MED ORDER — FENTANYL CITRATE (PF) 100 MCG/2ML IJ SOLN
INTRAMUSCULAR | Status: AC
  Filled 2019-09-16: qty 2

## 2019-09-16 MED ORDER — MIDAZOLAM HCL 2 MG/2ML IJ SOLN
INTRAMUSCULAR | Status: AC
  Filled 2019-09-16: qty 2

## 2019-09-16 MED ORDER — FENTANYL CITRATE (PF) 100 MCG/2ML IJ SOLN
INTRAMUSCULAR | Status: AC | PRN
  Administered 2019-09-16 (×2): 50 ug via INTRAVENOUS

## 2019-09-16 MED ORDER — MIDAZOLAM HCL 2 MG/2ML IJ SOLN
INTRAMUSCULAR | Status: AC | PRN
  Administered 2019-09-16 (×3): 1 mg via INTRAVENOUS

## 2019-09-16 MED ORDER — LIDOCAINE HCL (PF) 1 % IJ SOLN
INTRAMUSCULAR | Status: AC | PRN
  Administered 2019-09-16 (×2): 10 mL

## 2019-09-16 MED ORDER — LIDOCAINE HCL 1 % IJ SOLN
INTRAMUSCULAR | Status: AC
  Filled 2019-09-16: qty 20

## 2019-09-16 MED ORDER — SODIUM CHLORIDE 0.9 % IV SOLN: INTRAVENOUS | Status: DC

## 2019-09-16 MED ORDER — CEFAZOLIN SODIUM-DEXTROSE 2-4 GM/100ML-% IV SOLN
2.0000 g | INTRAVENOUS | Status: AC
  Administered 2019-09-16: 15:00:00 2 g via INTRAVENOUS

## 2019-09-16 MED ORDER — CEFAZOLIN SODIUM-DEXTROSE 2-4 GM/100ML-% IV SOLN
INTRAVENOUS | Status: AC
  Administered 2019-09-16: 2 g via INTRAVENOUS
  Filled 2019-09-16: qty 100

## 2019-09-16 NOTE — Telephone Encounter (Signed)
DPR reviewed- daughter [name] is not on it- LMOM for Pt to return call.

## 2019-09-16 NOTE — Telephone Encounter (Signed)
Copied from [place] [phone]. Topic: General - Inquiry >> Sep 16, 2019 12:04 PM Richardo Priest, NT wrote: Reason for CRM: Pt's daughter called in stating she would like to know if PCP can write a script for ensure for the patient, since it is costing her a bit of money. Please advise

## 2019-09-16 NOTE — Discharge Instructions (Addendum)
Implanted Port Insertion, Care After °This sheet gives you information about how to care for yourself after your procedure. Your health care provider may also give you more specific instructions. If you have problems or questions, contact your health care provider. °What can I expect after the procedure? °After the procedure, it is common to have: °· Discomfort at the port insertion site. °· Bruising on the skin over the port. This should improve over 3-4 days. °Follow these instructions at home: °Port care °· After your port is placed, you will get a manufacturer's information card. The card has information about your port. Keep this card with you at all times. °· Take care of the port as told by your health care provider. Ask your health care provider if you or a family member can get training for taking care of the port at home. A home health care nurse may also take care of the port. °· Make sure to remember what type of port you have. °Incision care ° °  ° °· Follow instructions from your health care provider about how to take care of your port insertion site. Make sure you: °? Wash your hands with soap and water before and after you change your bandage (dressing). If soap and water are not available, use hand sanitizer. °? Change your dressing as told by your health care provider. °? Leave stitches (sutures), skin glue, or adhesive strips in place. These skin closures may need to stay in place for 2 weeks or longer. If adhesive strip edges start to loosen and curl up, you may trim the loose edges. Do not remove adhesive strips completely unless your health care provider tells you to do that. °· Check your port insertion site every day for signs of infection. Check for: °? Redness, swelling, or pain. °? Fluid or blood. °? Warmth. °? Pus or a bad smell. °Activity °· Return to your normal activities as told by your health care provider. Ask your health care provider what activities are safe for you. °· Do not  lift anything that is heavier than 10 lb (4.5 kg), or the limit that you are told, until your health care provider says that it is safe. °General instructions °· Take over-the-counter and prescription medicines only as told by your health care provider. °· Do not take baths, swim, or use a hot tub until your health care provider approves. Ask your health care provider if you may take showers. You may only be allowed to take sponge baths. °· Do not drive for 24 hours if you were given a sedative during your procedure. °· Wear a medical alert bracelet in case of an emergency. This will tell any health care providers that you have a port. °· Keep all follow-up visits as told by your health care provider. This is important. °Contact a health care provider if: °· You cannot flush your port with saline as directed, or you cannot draw blood from the port. °· You have a fever or chills. °· You have redness, swelling, or pain around your port insertion site. °· You have fluid or blood coming from your port insertion site. °· Your port insertion site feels warm to the touch. °· You have pus or a bad smell coming from the port insertion site. °Get help right away if: °· You have chest pain or shortness of breath. °· You have bleeding from your port that you cannot control. °Summary °· Take care of the port as told by your health   care provider. Keep the manufacturer's information card with you at all times. °· Change your dressing as told by your health care provider. °· Contact a health care provider if you have a fever or chills or if you have redness, swelling, or pain around your port insertion site. °· Keep all follow-up visits as told by your health care provider. °This information is not intended to replace advice given to you by your health care provider. Make sure you discuss any questions you have with your health care provider. °Document Released: 07/30/2013 Document Revised: 05/07/2018 Document Reviewed:  05/07/2018 °Elsevier Patient Education © 2020 [employer] Inc[employer] ° ° ° °Implanted Port Home Guide °An implanted port is a device that is placed under the skin. It is usually placed in the chest. The device can be used to give IV medicine, to take blood, or for dialysis. You may have an implanted port if: °· You need IV medicine that would be irritating to the small veins in your hands or arms. °· You need IV medicines, such as antibiotics, for a long period of time. °· You need IV nutrition for a long period of time. °· You need dialysis. °Having a port means that your health care provider will not need to use the veins in your arms for these procedures. You may have fewer limitations when using a port than you would if you used other types of long-term IVs, and you will likely be able to return to normal activities after your incision heals. °An implanted port has two main parts: °· Reservoir. The reservoir is the part where a needle is inserted to give medicines or draw blood. The reservoir is round. After it is placed, it appears as a small, raised area under your skin. °· Catheter. The catheter is a thin, flexible tube that connects the reservoir to a vein. Medicine that is inserted into the reservoir goes into the catheter and then into the vein. °How is my port accessed? °To access your port: °· A numbing cream may be placed on the skin over the port site. °· Your health care provider will put on a mask and sterile gloves. °· The skin over your port will be cleaned carefully with a germ-killing soap and allowed to dry. °· Your health care provider will gently pinch the port and insert a needle into it. °· Your health care provider will check for a blood return to make sure the port is in the vein and is not clogged. °· If your port needs to remain accessed to get medicine continuously (constant infusion), your health care provider will place a clear bandage (dressing) over the needle site. The dressing and needle  will need to be changed every week, or as told by your health care provider. °What is flushing? °Flushing helps keep the port from getting clogged. Follow instructions from your health care provider about how and when to flush the port. Ports are usually flushed with saline solution or a medicine called heparin. The need for flushing will depend on how the port is used: °· If the port is only used from time to time to give medicines or draw blood, the port may need to be flushed: °? Before and after medicines have been given. °? Before and after blood has been drawn. °? As part of routine maintenance. Flushing may be recommended every 4-6 weeks. °· If a constant infusion is running, the port may not need to be flushed. °· Throw away any syringes in   a disposal container that is meant for sharp items (sharps container). You can buy a sharps container from a pharmacy, or you can make one by using an empty hard plastic bottle with a cover. °How long will my port stay implanted? °The port can stay in for as long as your health care provider thinks it is needed. When it is time for the port to come out, a surgery will be done to remove it. The surgery will be similar to the procedure that was done to put the port in. °Follow these instructions at home: ° °· Flush your port as told by your health care provider. °· If you need an infusion over several days, follow instructions from your health care provider about how to take care of your port site. Make sure you: °? Wash your hands with soap and water before you change your dressing. If soap and water are not available, use alcohol-based hand sanitizer. °? Change your dressing as told by your health care provider. °? Place any used dressings or infusion bags into a plastic bag. Throw that bag in the trash. °? Keep the dressing that covers the needle clean and dry. Do not get it wet. °? Do not use scissors or sharp objects near the tube. °? Keep the tube clamped, unless it  is being used. °· Check your port site every day for signs of infection. Check for: °? Redness, swelling, or pain. °? Fluid or blood. °? Pus or a bad smell. °· Protect the skin around the port site. °? Avoid wearing bra straps that rub or irritate the site. °? Protect the skin around your port from seat belts. Place a soft pad over your chest if needed. °· Bathe or shower as told by your health care provider. The site may get wet as long as you are not actively receiving an infusion. °· Return to your normal activities as told by your health care provider. Ask your health care provider what activities are safe for you. °· Carry a medical alert card or wear a medical alert bracelet at all times. This will let health care providers know that you have an implanted port in case of an emergency. °Get help right away if: °· You have redness, swelling, or pain at the port site. °· You have fluid or blood coming from your port site. °· You have pus or a bad smell coming from the port site. °· You have a fever. °Summary °· Implanted ports are usually placed in the chest for long-term IV access. °· Follow instructions from your health care provider about flushing the port and changing bandages (dressings). °· Take care of the area around your port by avoiding clothing that puts pressure on the area, and by watching for signs of infection. °· Protect the skin around your port from seat belts. Place a soft pad over your chest if needed. °· Get help right away if you have a fever or you have redness, swelling, pain, drainage, or a bad smell at the port site. °This information is not intended to replace advice given to you by your health care provider. Make sure you discuss any questions you have with your health care provider. °Document Released: 10/09/2005 Document Revised: 01/31/2019 Document Reviewed: 11/11/2016 °Elsevier Patient Education © 2020 Elsevier Inc. ° ° °Moderate Conscious Sedation, Adult, Care After °These  instructions provide you with information about caring for yourself after your procedure. Your health care provider may also give you more specific instructions.   Your treatment has been planned according to current medical practices, but problems sometimes occur. Call your health care provider if you have any problems or questions after your procedure. °What can I expect after the procedure? °After your procedure, it is common: °· To feel sleepy for several hours. °· To feel clumsy and have poor balance for several hours. °· To have poor judgment for several hours. °· To vomit if you eat too soon. °Follow these instructions at home: °For at least 24 hours after the procedure: ° °· Do not: °? Participate in activities where you could fall or become injured. °? Drive. °? Use heavy machinery. °? Drink alcohol. °? Take sleeping pills or medicines that cause drowsiness. °? Make important decisions or sign legal documents. °? Take care of children on your own. °· Rest. °Eating and drinking °· Follow the diet recommended by your health care provider. °· If you vomit: °? Drink water, juice, or soup when you can drink without vomiting. °? Make sure you have little or no nausea before eating solid foods. °General instructions °· Have a responsible adult stay with you until you are awake and alert. °· Take over-the-counter and prescription medicines only as told by your health care provider. °· If you smoke, do not smoke without supervision. °· Keep all follow-up visits as told by your health care provider. This is important. °Contact a health care provider if: °· You keep feeling nauseous or you keep vomiting. °· You feel light-headed. °· You develop a rash. °· You have a fever. °Get help right away if: °· You have trouble breathing. °This information is not intended to replace advice given to you by your health care provider. Make sure you discuss any questions you have with your health care provider. °Document Released:  07/30/2013 Document Revised: 09/21/2017 Document Reviewed: 01/29/2016 °Elsevier Patient Education © 2020 Elsevier Inc. ° °

## 2019-09-16 NOTE — Telephone Encounter (Addendum)
Please send a prescription, suggest Glucerna  since the patient has diabetes, as the daughter how many he needs, probably 3 times daily

## 2019-09-16 NOTE — Consult Note (Signed)
Chief Complaint: Patient was seen in consultation today for Port-A-Cath placement  Referring Physician(s): [place]  Supervising Physician: Aletta Edouard  Patient Status: [facility] - Out-pt  History of Present Illness: [name] [name] is a 78 y.o. 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  Past Medical History:  Diagnosis Date  . Arthritis   . Complication of anesthesia     had spinal with knee surgeries  -"heart rate too low" for general  . Detached retina, left    L eye normal vision, reports that he has had a retina procedure in a doctor's office at [facility]   . Diabetes mellitus without complication ([place])   . Dysrhythmia    slight irregular rate  . GERD (gastroesophageal reflux disease)    hx no problems now  . H/O echocardiogram 2014  . History of hiatal hernia    ?20 yrs ago  . Hyperopia 2016  . Hypertension   . Lumbar stenosis with neurogenic claudication   . MG, ocular (myasthenia gravis) ([place])    ? of L eye,   . Prediabetes   . Presbyopia OU 2016    Past Surgical History:  Procedure Laterality Date  . COLONOSCOPY    . HERNIA REPAIR     bilateral inguinal hernia  . HERNIA REPAIR     umbilical  . JOINT REPLACEMENT  2009   right knee  . LUMBAR LAMINECTOMY/DECOMPRESSION MICRODISCECTOMY N/A 06/06/2017   Procedure: Decompression L3-5, insitu fusion L3-5 ;  Surgeon: Melina Schools, MD;  Location: [place];  Service: Orthopedics;  Laterality: N/A;  . LYMPH NODE BIOPSY Right 09/01/2019   Procedure: CERVICAL LYMPH NODE BIOPSY;  Surgeon: [name], MD;  Location: [place];  Service: ENT;  Laterality: Right;  . MIDDLE EAR SURGERY Right    ear -patched hole in ear drum  . TOTAL KNEE ARTHROPLASTY  09/10/2012   Procedure: TOTAL KNEE ARTHROPLASTY;  Surgeon: Tobi Bastos, MD;  Location: [facility];  Service: Orthopedics;  Laterality: Left;  . TOTAL SHOULDER ARTHROPLASTY Right 06/22/2016   Procedure: RIGHT  TOTAL SHOULDER ARTHROPLASTY;  Surgeon: [name], MD;  Location: [place];  Service: Orthopedics;  Laterality: Right;    Allergies: Doxycycline  Medications: Prior to Admission medications   Medication Sig Start Date End Date Taking? Authorizing Provider  aspirin 81 MG tablet Take 81 mg by mouth daily.    [provider]  carvedilol (COREG) 12.5 MG tablet Take 1 tablet (12.5 mg total) by mouth 2 (two) times daily with a meal. 06/20/19   Colon Branch, MD  chlorthalidone (HYGROTON) 25 MG tablet Take 0.5 tablets (12.5 mg total) by mouth daily. 07/16/19   Colon Branch, MD  hydrochlorothiazide (HYDRODIURIL) 25 MG tablet Take 25 mg by mouth daily. 08/14/19   [provider]  ketoconazole (NIZORAL) 2 % cream Apply 1 application topically daily. Patient taking differently: Apply 1 application topically daily as needed for irritation.  05/28/19   Colon Branch, MD  losartan (COZAAR) 100 MG tablet Take 1 tablet (100 mg total) by mouth daily. 05/15/19   Colon Branch, MD  metFORMIN (GLUCOPHAGE) 500 MG tablet Take 1 tablet (500 mg total) by mouth 2 (two) times daily with a meal. Patient taking differently: Take 500 mg by mouth daily with breakfast.  04/04/19   Colon Branch, MD  Omega-3 Fatty Acids (FISH OIL) 500 MG CAPS Take 1 capsule by mouth daily.    [provider]  omeprazole (PRILOSEC) 20 MG capsule Take 1 capsule (20 mg total) by mouth daily. Patient taking differently: Take 20 mg by mouth 2 (two) times daily before a meal.  10/02/16   Colon Branch, MD  ondansetron (ZOFRAN) 8 MG tablet Take 1 tablet (8 mg total) by mouth every 8 (eight) hours as needed for nausea or vomiting. 09/09/19   Tish Men, MD  potassium chloride (KLOR-CON M10) 10 MEQ tablet Take 1 tablet (10 mEq total) by mouth daily. 05/15/19   Colon Branch, MD  simvastatin (ZOCOR) 80 MG tablet Take 0.5 tablets (40 mg total) by mouth daily. 10/07/18   Colon Branch, MD     Family History  Problem Relation Age of Onset  .  Diabetes Brother   . Colon cancer Brother 17       dx in 2018  . Hypertension Sister   . Prostate cancer Neg Hx   . Stroke Neg Hx   . CAD Neg Hx     Social History   Socioeconomic History  . Marital status: Married    Spouse name: Not on file  . Number of children: 4  . Years of education: Not on file  . Highest education level: Not on file  Occupational History  . Occupation: Audiological scientist-- retired 2010  Social Needs  . Financial resource strain: Not on file  . Food insecurity    Worry: Not on file    Inability: Not on file  . Transportation needs    Medical: Not on file    Non-medical: Not on file  Tobacco Use  . Smoking status: Former Smoker    Packs/day: 0.50    Years: 2.00    Pack years: 1.00    Types: Cigarettes    Quit date: 06/09/1966    Years since quitting: 53.3  . Smokeless tobacco: Never Used  Substance and Sexual Activity  . Alcohol use: No    Alcohol/week: 0.0 standard drinks    Comment: none  . Drug use: No  . Sexual activity: Yes    Partners: Female  Lifestyle  . Physical activity    Days per week: Not on file    Minutes per session: Not on file  . Stress: Not on file  Relationships  . Social Herbalist on phone: Not on file    Gets together: Not on file    Attends religious service: Not on file    Active member of club or organization: Not on file    Attends meetings of clubs or organizations: Not on file    Relationship status: Not on file  Other Topics Concern  . Not on file  Social History Narrative   married, lives w/ second wife, 2 living children, lost 2 kids     Has a small dog      Review of Systems currently denies fever, headache, chest pain, dyspnea, cough, abdominal pain, back pain, vomiting or abnormal bleeding.  He does have some occasional nausea/queasy stomach and neck swelling  Vital Signs: BP (!) 141/82   Pulse 82   Temp 98.1 F (36.7 C) (Oral)   Resp 18   SpO2 99%   Physical Exam awake, alert.   Chest with distant breath sounds bilaterally.  Heart with regular rate and rhythm.  Abdomen obese, soft, positive bowel sounds, nontender.  Bilateral pretibial edema noted.  Bilateral cervical/supraclavicular adenopathy.  Imaging: Ct Soft Tissue Neck W Contrast  Result Date: 08/25/2019 CLINICAL DATA:  Right neck swelling. EXAM: CT NECK WITH CONTRAST TECHNIQUE: Multidetector CT imaging of the neck was performed using the standard protocol following the bolus administration of intravenous contrast. CONTRAST:  64mL ISOVUE-300 IOPAMIDOL (ISOVUE-300) INJECTION 61% COMPARISON:  None. FINDINGS: Pharynx and larynx: There is infiltration of the superficial and deep subcutaneous fat of the right neck. There are multiple nonenlarged and enlarged lymph nodes at levels 2-5 as well as in the supraclavicular region. The largest, a level 2 node, measures 4.3 x 2.3 cm. There is ill-defined low-attenuation within some nodes, which could reflect cystic change or necrosis. Much less pronounced changes are also present on the left. No pharyngeal or laryngeal mass identified. Palatine tonsillar calcifications are incidentally noted. Salivary glands: Unremarkable. Thyroid: Mildly enlarged.  Otherwise unremarkable. Lymph nodes: As above. Vascular: Not well opacified. Limited intracranial: No abnormal enhancement. Visualized orbits: Unremarkable. Mastoids and visualized paranasal sinuses: Aerated. Skeleton: Multilevel degenerative changes of the cervical spine. Upper chest: No apical lung mass. Other: None. IMPRESSION: Right greater than left cervical and supraclavicular adenopathy with some nodes demonstrating potential cystic change or necrosis. This may be infectious/inflammatory or neoplastic and tissue sampling is recommended. Electronically Signed   By: Macy Mis M.D.   On: 08/25/2019 14:14   Ct Chest W Contrast  Result Date: 08/25/2019 CLINICAL DATA:  Neck swelling EXAM: CT CHEST WITH CONTRAST TECHNIQUE: Multidetector  CT imaging of the chest was performed during intravenous contrast administration. CONTRAST:  30mL ISOVUE-300 IOPAMIDOL (ISOVUE-300) INJECTION 61% COMPARISON:  None. FINDINGS: Cardiovascular: Normal heart size. Dense coronary calcifications. No pericardial effusion. Mediastinum/Nodes: 3 cm subcarinal lymph node. Lower cervical-supraclavicular enlarged nodes are present bilaterally measuring up 1.6 cm. Lungs/Pleura: 1.6 and 1.1 cm irregular nodules in the medial basilar segment of the right lower lobe. Small amount of adjacent pleural thickening is present. No consolidation or pleural effusion. Small areas of bilateral apical paraseptal emphysema. Upper Abdomen: Bulky adenopathy in the upper retroperitoneum with largest node measuring 4 point 9 cm adjacent to the IVC and multiple nodal masses measuring greater than 3 cm. Splenomegaly measuring 14.2 cm in length. Scattered inflammatory stranding adjacent to the distal pancreatic body-tail. 11 cm lobulated cyst in the left kidney. Musculoskeletal: 2.8 cm enlarged node in the left axilla. IMPRESSION: Multiple areas of adenopathy involving the lower neck, left axilla, subcarinal station, and largest areas of nodal masses throughout the visualized retroperitoneum. Splenomegaly. Scattered inflammatory stranding adjacent to the distal pancreatic body-tail. 11 cm lobulated cyst in the left kidney. 1.6 and 1.1 cm irregular nodules in the medial basilar segment of the right lower lobe. Electronically Signed   By: Mancel Parsons M.D.   On: 08/25/2019 12:49    Labs:  CBC: Recent Labs    06/16/19 2017 06/20/19 1539 08/07/19 1126 09/09/19 1301  WBC 9.4 8.3 9.3 9.6  HGB 11.8* 11.1* 12.5* 12.4*  HCT 35.8* 32.6* 37.0* 37.5*  PLT 118* 237 295 361    COAGS: Recent Labs    06/16/19 2048  INR 1.1    BMP: Recent Labs    06/16/19 2017 08/07/19 1126 09/01/19 0617 09/09/19 1301  NA 132* 132* 133* 131*  K 3.7 4.4 4.2 4.2  CL 93* 95* 101 96*  CO2 27 26 20*  24  GLUCOSE 112* 113* 119* 135*  BUN 15 14 13 17   CALCIUM 8.6* 9.0 9.2 9.5  CREATININE 1.16 1.01 1.32* 1.01  GFRNONAA >60  --  52* >60  GFRAA >60  --  60* >60    LIVER FUNCTION TESTS: Recent  Labs    10/21/18 1401 06/16/19 2017 08/07/19 1126 09/09/19 1301  BILITOT 0.5 0.9 0.6 0.6  AST 13 23 9  8*  ALT 16 18 9 8   ALKPHOS 63 81 94 70  PROT 6.6 6.7 7.6 7.0  ALBUMIN 4.3 3.4* 3.9 3.7    TUMOR MARKERS: No results for input(s): AFPTM, CEA, CA199, CHROMGRNA in the last 8760 hours.  Assessment and Plan: 78 y.o. 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  All of the patient's questions were answered, patient is agreeable to proceed. Consent signed and in chart.  LABS PENDING   Thank you for this interesting consult.  I greatly enjoyed meeting JONQUEZ COURIER and look forward to participating in their care.  A copy of this report was sent to the requesting provider on this date.  Electronically Signed: D. Rowe Robert, PA-C 09/16/2019, 1:14 PM   I spent a total of  25 minutes  in face to face in clinical consultation, greater than 50% of which was counseling/coordinating care for Port-A-Cath placement

## 2019-09-16 NOTE — Procedures (Signed)
Interventional Radiology Procedure Note  Procedure: Single Lumen Power Port Placement    Access:  Right IJ vein.  Findings: Catheter tip positioned at SVC/RA junction. Port is ready for immediate use.   Complications: None  EBL: < 10 mL  Recommendations:  - Ok to shower in 24 hours - Do not submerge for 7 days - Routine line care   Cecely Rengel T. Zuriah Bordas, M.D Pager:  [phone]   

## 2019-09-17 ENCOUNTER — Other Ambulatory Visit: Payer: Self-pay | Admitting: Radiology

## 2019-09-17 ENCOUNTER — Encounter (HOSPITAL_COMMUNITY)
Admission: RE | Admit: 2019-09-17 | Discharge: 2019-09-17 | Disposition: A | Discharge status: Home / Self Care | Payer: HMO | Source: Ambulatory Visit | Attending: Hematology | Admitting: Hematology

## 2019-09-17 DIAGNOSIS — C8590 Non-Hodgkin lymphoma, unspecified, unspecified site: Secondary | ICD-10-CM | POA: Insufficient documentation

## 2019-09-17 DIAGNOSIS — I700 Atherosclerosis of aorta: Secondary | ICD-10-CM | POA: Diagnosis not present

## 2019-09-17 DIAGNOSIS — I2510 Atherosclerotic heart disease of native coronary artery without angina pectoris: Secondary | ICD-10-CM | POA: Diagnosis not present

## 2019-09-17 DIAGNOSIS — R188 Other ascites: Secondary | ICD-10-CM | POA: Diagnosis not present

## 2019-09-17 DIAGNOSIS — J90 Pleural effusion, not elsewhere classified: Secondary | ICD-10-CM | POA: Insufficient documentation

## 2019-09-17 DIAGNOSIS — C8448 Peripheral T-cell lymphoma, not classified, lymph nodes of multiple sites: Secondary | ICD-10-CM | POA: Diagnosis not present

## 2019-09-17 LAB — GLUCOSE, CAPILLARY: Glucose-Capillary: 147 mg/dL — ABNORMAL HIGH (ref 70–99)

## 2019-09-17 MED ORDER — FLUDEOXYGLUCOSE F - 18 (FDG) INJECTION
14.5600 | Freq: Once | INTRAVENOUS | Status: AC | PRN
  Administered 2019-09-17: 14.56 via INTRAVENOUS

## 2019-09-19 ENCOUNTER — Encounter (HOSPITAL_COMMUNITY): Payer: Self-pay | Admitting: Pulmonary Disease

## 2019-09-19 ENCOUNTER — Ambulatory Visit (HOSPITAL_COMMUNITY)
Admission: RE | Admit: 2019-09-19 | Discharge: 2019-09-19 | Disposition: A | Discharge status: Home / Self Care | Payer: HMO | Source: Ambulatory Visit | Attending: Hematology | Admitting: Hematology

## 2019-09-19 ENCOUNTER — Encounter (HOSPITAL_COMMUNITY): Payer: Self-pay

## 2019-09-19 ENCOUNTER — Other Ambulatory Visit: Payer: Self-pay

## 2019-09-19 DIAGNOSIS — D649 Anemia, unspecified: Secondary | ICD-10-CM | POA: Insufficient documentation

## 2019-09-19 DIAGNOSIS — Z79899 Other long term (current) drug therapy: Secondary | ICD-10-CM | POA: Insufficient documentation

## 2019-09-19 DIAGNOSIS — I10 Essential (primary) hypertension: Secondary | ICD-10-CM | POA: Diagnosis not present

## 2019-09-19 DIAGNOSIS — Z7901 Long term (current) use of anticoagulants: Secondary | ICD-10-CM | POA: Insufficient documentation

## 2019-09-19 DIAGNOSIS — Z7982 Long term (current) use of aspirin: Secondary | ICD-10-CM | POA: Diagnosis not present

## 2019-09-19 DIAGNOSIS — E119 Type 2 diabetes mellitus without complications: Secondary | ICD-10-CM | POA: Diagnosis not present

## 2019-09-19 DIAGNOSIS — D72810 Lymphocytopenia: Secondary | ICD-10-CM | POA: Diagnosis not present

## 2019-09-19 DIAGNOSIS — C8590 Non-Hodgkin lymphoma, unspecified, unspecified site: Secondary | ICD-10-CM | POA: Diagnosis not present

## 2019-09-19 DIAGNOSIS — K219 Gastro-esophageal reflux disease without esophagitis: Secondary | ICD-10-CM | POA: Diagnosis not present

## 2019-09-19 DIAGNOSIS — H5200 Hypermetropia, unspecified eye: Secondary | ICD-10-CM | POA: Insufficient documentation

## 2019-09-19 DIAGNOSIS — Z7984 Long term (current) use of oral hypoglycemic drugs: Secondary | ICD-10-CM | POA: Insufficient documentation

## 2019-09-19 DIAGNOSIS — C8440 Peripheral T-cell lymphoma, not classified, unspecified site: Secondary | ICD-10-CM | POA: Diagnosis not present

## 2019-09-19 LAB — CBC WITH DIFFERENTIAL/PLATELET
Abs Immature Granulocytes: 0.08 10*3/uL — ABNORMAL HIGH (ref 0.00–0.07)
Basophils Absolute: 0.1 10*3/uL (ref 0.0–0.1)
Basophils Relative: 1 %
Eosinophils Absolute: 0.2 10*3/uL (ref 0.0–0.5)
Eosinophils Relative: 2 %
HCT: 39.1 % (ref 39.0–52.0)
Hemoglobin: 12.7 g/dL — ABNORMAL LOW (ref 13.0–17.0)
Immature Granulocytes: 1 %
Lymphocytes Relative: 3 %
Lymphs Abs: 0.4 10*3/uL — ABNORMAL LOW (ref 0.7–4.0)
MCH: 27.9 pg (ref 26.0–34.0)
MCHC: 32.5 g/dL (ref 30.0–36.0)
MCV: 85.9 fL (ref 80.0–100.0)
Monocytes Absolute: 0.9 10*3/uL (ref 0.1–1.0)
Monocytes Relative: 6 %
Neutro Abs: 12.8 10*3/uL — ABNORMAL HIGH (ref 1.7–7.7)
Neutrophils Relative %: 87 %
Platelets: 351 10*3/uL (ref 150–400)
RBC: 4.55 MIL/uL (ref 4.22–5.81)
RDW: 14.9 % (ref 11.5–15.5)
WBC: 14.5 10*3/uL — ABNORMAL HIGH (ref 4.0–10.5)
nRBC: 0 % (ref 0.0–0.2)

## 2019-09-19 LAB — SURGICAL PATHOLOGY

## 2019-09-19 LAB — GLUCOSE, CAPILLARY: Glucose-Capillary: 154 mg/dL — ABNORMAL HIGH (ref 70–99)

## 2019-09-19 MED ORDER — LIDOCAINE HCL (PF) 1 % IJ SOLN
INTRAMUSCULAR | Status: AC | PRN
  Administered 2019-09-19: 10 mL

## 2019-09-19 MED ORDER — FENTANYL CITRATE (PF) 100 MCG/2ML IJ SOLN
INTRAMUSCULAR | Status: AC
  Filled 2019-09-19: qty 2

## 2019-09-19 MED ORDER — SODIUM CHLORIDE 0.9 % IV SOLN
INTRAVENOUS | Status: DC
  Administered 2019-09-19: 08:00:00 via INTRAVENOUS

## 2019-09-19 MED ORDER — FENTANYL CITRATE (PF) 100 MCG/2ML IJ SOLN
INTRAMUSCULAR | Status: AC | PRN
  Administered 2019-09-19 (×2): 50 ug via INTRAVENOUS

## 2019-09-19 MED ORDER — HEPARIN SOD (PORK) LOCK FLUSH 100 UNIT/ML IV SOLN
500.0000 [IU] | Freq: Once | INTRAVENOUS | Status: AC
  Administered 2019-09-19: 500 [IU] via INTRAVENOUS
  Filled 2019-09-19: qty 5

## 2019-09-19 MED ORDER — MIDAZOLAM HCL 2 MG/2ML IJ SOLN
INTRAMUSCULAR | Status: AC | PRN
  Administered 2019-09-19 (×2): 1 mg via INTRAVENOUS

## 2019-09-19 MED ORDER — MIDAZOLAM HCL 2 MG/2ML IJ SOLN
INTRAMUSCULAR | Status: AC
  Filled 2019-09-19: qty 4

## 2019-09-19 NOTE — Discharge Instructions (Signed)
Moderate Conscious Sedation, Adult, Care After °These instructions provide you with information about caring for yourself after your procedure. Your health care provider may also give you more specific instructions. Your treatment has been planned according to current medical practices, but problems sometimes occur. Call your health care provider if you have any problems or questions after your procedure. °What can I expect after the procedure? °After your procedure, it is common: °· To feel sleepy for several hours. °· To feel clumsy and have poor balance for several hours. °· To have poor judgment for several hours. °· To vomit if you eat too soon. °Follow these instructions at home: °For at least 24 hours after the procedure: ° °· Do not: °? Participate in activities where you could fall or become injured. °? Drive. °? Use heavy machinery. °? Drink alcohol. °? Take sleeping pills or medicines that cause drowsiness. °? Make important decisions or sign legal documents. °? Take care of children on your own. °· Rest. °Eating and drinking °· Follow the diet recommended by your health care provider. °· If you vomit: °? Drink water, juice, or soup when you can drink without vomiting. °? Make sure you have little or no nausea before eating solid foods. °General instructions °· Have a responsible adult stay with you until you are awake and alert. °· Take over-the-counter and prescription medicines only as told by your health care provider. °· If you smoke, do not smoke without supervision. °· Keep all follow-up visits as told by your health care provider. This is important. °Contact a health care provider if: °· You keep feeling nauseous or you keep vomiting. °· You feel light-headed. °· You develop a rash. °· You have a fever. °Get help right away if: °· You have trouble breathing. °This information is not intended to replace advice given to you by your health care provider. Make sure you discuss any questions you have  with your health care provider. °Document Released: 07/30/2013 Document Revised: 09/21/2017 Document Reviewed: 01/29/2016 °Elsevier Patient Education © 2020 [employer] ° ° °Bone Marrow Aspiration and Bone Marrow Biopsy, Adult, Care After °This sheet gives you information about how to care for yourself after your procedure. Your health care provider may also give you more specific instructions. If you have problems or questions, contact your health care provider. °What can I expect after the procedure? °After the procedure, it is common to have: °· Mild pain and tenderness. °· Swelling. °· Bruising. °Follow these instructions at home: °Puncture site care ° °  ° °· Follow instructions from your health care provider about how to take care of the puncture site. Make sure you: °? Wash your hands with soap and water before you change your bandage (dressing). If soap and water are not available, use hand sanitizer. °? Change your dressing as told by your health care provider. °· Check your puncture site every day for signs of infection. Check for: °? More redness, swelling, or pain. °? More fluid or blood. °? Warmth. °? Pus or a bad smell. °General instructions °· Take over-the-counter and prescription medicines only as told by your health care provider. °· Do not take baths, swim, or use a hot tub until your health care provider approves. Ask if you can take a shower or have a sponge bath. °· Return to your normal activities as told by your health care provider. Ask your health care provider what activities are safe for you. °· Do not drive for 24 hours if you were   given a medicine to help you relax (sedative) during your procedure. °· Keep all follow-up visits as told by your health care provider. This is important. °Contact a health care provider if: °· Your pain is not controlled with medicine. °Get help right away if: °· You have a fever. °· You have more redness, swelling, or pain around the puncture site. °· You  have more fluid or blood coming from the puncture site. °· Your puncture site feels warm to the touch. °· You have pus or a bad smell coming from the puncture site. °These symptoms may represent a serious problem that is an emergency. Do not wait to see if the symptoms will go away. Get medical help right away. Call your local emergency services (911 in the [place] Do not drive yourself to the hospital. °Summary °· After the procedure, it is common to have mild pain, tenderness, swelling, and bruising. °· Follow instructions from your health care provider about how to take care of the puncture site. °· Get help right away if you have any symptoms of infection or if you have more blood or fluid coming from the puncture site. °This information is not intended to replace advice given to you by your health care provider. Make sure you discuss any questions you have with your health care provider. °Document Released: 04/28/2005 Document Revised: 01/22/2018 Document Reviewed: 03/22/2016 °Elsevier Patient Education © 2020 Elsevier Inc. ° ° °

## 2019-09-19 NOTE — H&P (Signed)
Referring Physician(s): Zhao,Yan  Supervising Physician: Sandi Mariscal  Patient Status:  [facility]  Chief Complaint:  "I'm having a bone marrow biopsy"   Subjective: Patient familiar to IR service from Port-A-Cath placement on 09/16/2019.  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  He currently denies fever, headache, chest pain, dyspnea, cough, vomiting or abnormal bleeding.  He does have some occasional abdominal discomfort/nausea and neck swelling.  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 of L eye,    Prediabetes    Presbyopia OU 2016   Past Surgical History:  Procedure Laterality Date   COLONOSCOPY     HERNIA REPAIR     bilateral inguinal hernia   HERNIA REPAIR     umbilical   IR IMAGING GUIDED PORT INSERTION  09/16/2019   JOINT REPLACEMENT  2009   right knee   LUMBAR LAMINECTOMY/DECOMPRESSION MICRODISCECTOMY N/A 06/06/2017   Procedure: Decompression L3-5, insitu fusion L3-5 ;  Surgeon: Melina Schools, MD;  Location: [place];  Service: Orthopedics;  Laterality: N/A;   LYMPH NODE BIOPSY Right 09/01/2019   Procedure: CERVICAL LYMPH NODE BIOPSY;  Surgeon: [name], MD;  Location: [place];  Service: ENT;  Laterality: Right;   MIDDLE EAR SURGERY Right    ear -patched hole in ear drum   TOTAL  KNEE ARTHROPLASTY  09/10/2012   Procedure: TOTAL KNEE ARTHROPLASTY;  Surgeon: Tobi Bastos, MD;  Location: [facility];  Service: Orthopedics;  Laterality: Left;   TOTAL SHOULDER ARTHROPLASTY Right 06/22/2016   Procedure: RIGHT TOTAL SHOULDER ARTHROPLASTY;  Surgeon: [name], MD;  Location: [place];  Service: Orthopedics;  Laterality: Right;       Allergies: Doxycycline  Medications: Prior to Admission medications   Medication Sig Start Date End Date Taking? Authorizing Provider  aspirin 81 MG tablet Take 81 mg by mouth daily.   Yes [provider]  carvedilol (COREG) 12.5 MG tablet Take 1 tablet (12.5 mg total) by mouth 2 (two) times daily with a meal. 06/20/19  Yes [name], [name], MD  chlorthalidone (HYGROTON) 25 MG tablet Take 0.5 tablets (12.5 mg total) by mouth daily. 07/16/19  Yes [name], [name], MD  hydrochlorothiazide (HYDRODIURIL) 25 MG tablet Take 25 mg by mouth daily. 08/14/19  Yes [provider]  ketoconazole (NIZORAL) 2 % cream Apply 1 application topically daily. Patient taking differently: Apply 1 application topically daily as needed for irritation.  05/28/19  Yes [name], [name], MD  losartan (COZAAR) 100 MG tablet Take 1 tablet (100 mg total) by mouth daily. 05/15/19  Yes [name], [name], MD  metFORMIN (GLUCOPHAGE) 500 MG tablet Take 1 tablet (500 mg total) by mouth 2 (two) times daily with  a meal. Patient taking differently: Take 500 mg by mouth daily with breakfast.  04/04/19  Yes [name], [name], MD  Omega-3 Fatty Acids (FISH OIL) 500 MG CAPS Take 1 capsule by mouth daily.   Yes [provider]  omeprazole (PRILOSEC) 20 MG capsule Take 1 capsule (20 mg total) by mouth daily. Patient taking differently: Take 20 mg by mouth 2 (two) times daily before a meal.  10/02/16  Yes [name], [name], MD  ondansetron (ZOFRAN) 8 MG tablet Take 1 tablet (8 mg total) by mouth every 8 (eight) hours as needed for nausea or vomiting. 09/09/19  Yes Tish Men, MD  potassium chloride  (KLOR-CON M10) 10 MEQ tablet Take 1 tablet (10 mEq total) by mouth daily. 05/15/19  Yes [name], [name], MD  simvastatin (ZOCOR) 80 MG tablet Take 0.5 tablets (40 mg total) by mouth daily. 10/07/18  Yes Colon Branch, MD     Vital Signs: Blood pressure 155/99, temperature 98, heart rate 104, respirations 19, O2 sat 96% room air Ht 6' (1.829 m)    Wt 290 lb (131.5 kg)    BMI 39.33 kg/m   Physical Exam awake, alert.  Chest with distant breath sounds bilaterally.  Clean, intact right chest wall Port-A-Cath.  Heart with tachycardic but regular rhythm.  Abdomen obese, soft, positive bowel sounds, some mild mid abdominal tenderness to palpation.  Bilateral pretibial edema noted.  Bilateral cervical/supraclavicular adenopathy.  Imaging: Pet, Initial (skull Base To Thigh)  Result Date: 09/17/2019 CLINICAL DATA:  Initial treatment strategy for T-cell lymphoma/leukemia. EXAM: NUCLEAR MEDICINE PET SKULL BASE TO THIGH TECHNIQUE: 14.6 mCi F-18 FDG was injected intravenously. Full-ring PET imaging was performed from the skull base to thigh after the radiotracer. CT data was obtained and used for attenuation correction and anatomic localization. Fasting blood glucose: 147 mg/dl COMPARISON:  Chest and neck CTs of 08/25/2019. FINDINGS: Mediastinal blood pool activity: SUV max 3.1 Liver activity: SUV max 3.8 NECK: Bilateral hypermetabolic cervical adenopathy. An index right level 2 nodal conglomerate measures 5.4 x 3.0 cm and a S.U.V. max of 18.6 on 29/4. Incidental CT findings: Deferred to recent diagnostic CT. CHEST: Extensive hypermetabolic thoracic adenopathy. Subcarinal node measures 2.3 cm and a S.U.V. max of 19.2 on 78/4 Left axillary node measures 3.2 cm and a S.U.V. max of 18.1 on 69/4. Incidental CT findings: Deferred to recent diagnostic CT. New small bilateral pleural effusions. Right Port-A-Cath tip at low SVC. Aortic and coronary artery atherosclerosis. ABDOMEN/PELVIS: Extensive abdominal hypermetabolic  adenopathy. An index left periaortic node measures 3.6 cm and a S.U.V. max of 20.3 on 129/4. Portal caval node measures 3.7 cm and a S.U.V. max of 22.9 on 120/4. An index right external iliac node measures 1.3 cm and a S.U.V. max of 19.2 on 171/4. Index left inguinal node measures 1.5 cm and a S.U.V. max of 15.3 on 192/4. No splenic hypermetabolism. Incidental CT findings: Small volume abdominopelvic ascites, new since the prior diagnostic CT. Interpolar left renal 9.4 cm cyst. Abdominal aortic atherosclerosis. SKELETON: No abnormal marrow activity. 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 IMPRESSION: 1. Active lymphoma within the neck, chest, abdomen, and pelvis, as detailed above. ([place]) 4 2. Development of small bilateral pleural effusions and abdominal ascites, possibly related to fluid overload. 3. Coronary artery atherosclerosis. Aortic Atherosclerosis (ICD10-I70.0). Electronically Signed   By: Abigail Miyamoto M.D.   On: 09/17/2019 10:23   Ir Port Placement [employer] Long)  Result  Date: 09/16/2019 CLINICAL DATA:  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 EXAM: IMPLANTED PORT A CATH PLACEMENT WITH ULTRASOUND AND FLUOROSCOPIC GUIDANCE ANESTHESIA/SEDATION: 3.0 mg IV Versed; 100 mcg IV Fentanyl Total Moderate Sedation Time:  30 minutes The patient's level of consciousness and physiologic status were continuously monitored during the procedure by Radiology nursing. Additional Medications: 2 g IV Ancef. FLUOROSCOPY TIME:  30 seconds.  25.8 mGy. PROCEDURE: The procedure, risks, benefits, and alternatives were explained to the patient. Questions regarding the procedure were encouraged and answered. The patient understands and consents to the procedure. A time-out was performed prior to initiating the procedure. Ultrasound was utilized to confirm patency of the right internal jugular vein. The right  neck and chest were prepped with chlorhexidine in a sterile fashion, and a sterile drape was applied covering the operative field. Maximum barrier sterile technique with sterile gowns and gloves were used for the procedure. Local anesthesia was provided with 1% lidocaine. After creating a small venotomy incision, a 21 gauge needle was advanced into the right internal jugular vein under direct, real-time ultrasound guidance. Ultrasound image documentation was performed. After securing guidewire access, an 8 Fr dilator was placed. A J-wire was kinked to measure appropriate catheter length. A subcutaneous port pocket was then created along the upper chest wall utilizing sharp and blunt dissection. Portable cautery was utilized. The pocket was irrigated with sterile saline. A single lumen power injectable port was chosen for placement. The 8 Fr catheter was tunneled from the port pocket site to the venotomy incision. The port was placed in the pocket. External catheter was trimmed to appropriate length based on guidewire measurement. At the venotomy, an 8 Fr peel-away sheath was placed over a guidewire. The catheter was then placed through the sheath and the sheath removed. Final catheter positioning was confirmed and documented with a fluoroscopic spot image. The port was accessed with a needle and aspirated and flushed with heparinized saline. The access needle was removed. The venotomy and port pocket incisions were closed with subcutaneous 3-0 Monocryl and subcuticular 4-0 Vicryl. Dermabond was applied to both incisions. COMPLICATIONS: COMPLICATIONS None FINDINGS: After catheter placement, the tip lies at the cavo-atrial junction. The catheter aspirates normally and is ready for immediate use. IMPRESSION: Placement of single lumen port a cath via right internal jugular vein. The catheter tip lies at the cavo-atrial junction. A power injectable port a cath was placed and is ready for immediate use. Electronically  Signed   By: Aletta Edouard M.D.   On: 09/16/2019 15:51    Labs:  CBC: Recent Labs    08/07/19 1126 09/09/19 1301 09/16/19 1320 09/19/19 0750  WBC 9.3 9.6 10.9* 14.5*  HGB 12.5* 12.4* 12.9* 12.7*  HCT 37.0* 37.5* 39.8 39.1  PLT 295 361 368 351    COAGS: Recent Labs    06/16/19 2048 09/16/19 1320  INR 1.1 1.2  APTT  --  29    BMP: Recent Labs    06/16/19 2017 08/07/19 1126 09/01/19 0617 09/09/19 1301  NA 132* 132* 133* 131*  K 3.7 4.4 4.2 4.2  CL 93* 95* 101 96*  CO2 27 26 20* 24  GLUCOSE 112* 113* 119* 135*  BUN 15 14 13 17   CALCIUM 8.6* 9.0 9.2 9.5  CREATININE 1.16 1.01 1.32* 1.01  GFRNONAA >60  --  52* >60  GFRAA >60  --  60* >60    LIVER FUNCTION TESTS: Recent Labs    10/21/18 1401 06/16/19 2017 08/07/19 1126  09/09/19 1301  BILITOT 0.5 0.9 0.6 0.6  AST 13 23 9  8*  ALT 16 18 9 8   ALKPHOS 63 81 94 70  PROT 6.6 6.7 7.6 7.0  ALBUMIN 4.3 3.4* 3.9 3.7    Assessment and Plan: 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  All of the questions were answered and there is agreement to proceed.  Consent signed and in chart.     Electronically Signed: D. Rowe Robert, PA-C 09/19/2019, 8:29 AM   I spent a total of 20 minutes at the the patient's bedside AND on the patient's hospital floor or unit, greater than 50% of which was counseling/coordinating care for CT-guided bone marrow biopsy

## 2019-09-19 NOTE — Procedures (Signed)
Pre-procedure Diagnosis: T-cell lyphoma Post-procedure Diagnosis: Same  Technically successful CT guided bone marrow aspiration and biopsy of left iliac crest.   Complications: None Immediate  EBL: None  Signed: Sandi Mariscal Pager: [phone] 09/19/2019, 9:48 AM

## 2019-09-22 ENCOUNTER — Other Ambulatory Visit: Payer: Self-pay

## 2019-09-22 ENCOUNTER — Encounter: Payer: Self-pay | Admitting: Hematology

## 2019-09-22 ENCOUNTER — Other Ambulatory Visit: Payer: Self-pay | Admitting: Hematology

## 2019-09-22 DIAGNOSIS — C8440 Peripheral T-cell lymphoma, not classified, unspecified site: Secondary | ICD-10-CM

## 2019-09-22 NOTE — Progress Notes (Signed)
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

## 2019-09-22 NOTE — Progress Notes (Deleted)
[facility] OFFICE PROGRESS NOTE  Patient Care Team: Colon Branch, MD as PCP - General [name], [name], MD as Consulting Physician (Cardiology) [name], MD as Consulting Physician (Gastroenterology) [name], MD as Consulting Physician (Orthopedic Surgery) [name], MD as Consulting Physician (Physical Medicine and Rehabilitation) Luretha Rued, RN as Fellows Management [name], MD as Consulting Physician (Otolaryngology)  HEME/ONC OVERVIEW: 1. Stage III angioimmunoblastic T-cell lymphoma -07/2019: L neck LN FNA non-diagnostic  -08/2019:   Multiple enlarged cervical, mediastinal, bulky retroperitoneal LN's (~5cm), and irregular RLL nodule (1.6x1.1cm) on CT   Excisional left cervical LN bx, path showed angioimmunoblastic T-cell lymphoma, CD30+ (at least 25%)  FDG-avid LN's in the neck, chest and abdomen/pelvis; no bone involvement  Bone marrow biopsy results pending  -09/2019 - present: Adcetris + CHP w/ G-CSF support (tentatively)  TREATMENT SUMMARY:  09/29/2019 - present: Adcetris + CHP w/ G-CSF support (tentatively)  ASSESSMENT & PLAN:   Stage III angioimmunoblastic T-cell lymphoma -I independently reviewed the radiologic images of recent data, and agree with findings documented -In summary, PET showed FDG-avid LN's in the neck, chest, and abdomen/pelvis.  There was no definite bony involvement.  Bone marrow biopsy results are pending. -I reviewed imaging and pathology results in detail with the patient, as well as NCCN guideline -Given the angioimmunoblastic T-cell lymphoma was CD30+, I recommended Adcetris + CHP based on the ECHELON-2 trial -We discussed some of the risks, benefits and side-effects of Adcetris + CHP.  -Some of the short term side-effects included, though not limited to, risk of fatigue, weight loss, tumor lysis syndrome, risk of allergic reactions, pancytopenia, life-threatening infections, need  for transfusions of blood products, nausea, vomiting, change in bowel habits, admission to hospital for various reasons, and risks of death.  -Long term side-effects are also discussed including permanent damage to nerve function, chronic fatigue, and rare secondary malignancy including bone marrow disorders.  -The patient is aware that the response rates discussed earlier is not guaranteed.   -After a long discussion, patient made an informed decision to proceed with the prescribed plan of care.  -He will need chemotherapy education prior to starting treatment  -We will tentatively schedule the 1st treatment to start on 09/29/2019   Normocytic anemia -Likely due to anemia of chronic disease  -Hgb ___ today -Patient denies any symptoms of bleeding -Bone marrow biopsy results pending  -We will monitor it for now    No orders of the defined types were placed in this encounter.   All questions were answered. The patient knows to call the clinic with any problems, questions or concerns. No barriers to learning was detected.  A total of more than {CHL ONC TIME VISIT - FVCBS:[id number]} were spent face-to-face with the patient during this encounter and over half of that time was spent on counseling and coordination of care as outlined above.   Tish Men, MD 09/22/2019 12:14 PM  CHIEF COMPLAINT: "I am here for *** "  INTERVAL HISTORY:   REVIEW OF SYSTEMS:   Constitutional: ( - ) fevers, ( - )  chills , ( - ) night sweats Eyes: ( - ) blurriness of vision, ( - ) double vision, ( - ) watery eyes Ears, nose, mouth, throat, and face: ( - ) mucositis, ( - ) sore throat Respiratory: ( - ) cough, ( - ) dyspnea, ( - ) wheezes Cardiovascular: ( - ) palpitation, ( - ) chest discomfort, ( - ) lower extremity  swelling Gastrointestinal:  ( - ) nausea, ( - ) heartburn, ( - ) change in bowel habits Skin: ( - ) abnormal skin rashes Lymphatics: ( - ) new lymphadenopathy, ( - ) easy bruising Neurological:  ( - ) numbness, ( - ) tingling, ( - ) new weaknesses Behavioral/Psych: ( - ) mood change, ( - ) new changes  All other systems were reviewed with the patient and are negative.  SUMMARY OF ONCOLOGIC HISTORY: Oncology History  Angioimmunoblastic lymphoma ([facility])  08/25/2019 Imaging   CT neck: IMPRESSION: Right greater than left cervical and supraclavicular adenopathy with some nodes demonstrating potential cystic change or necrosis. This may be infectious/inflammatory or neoplastic and tissue sampling is recommended.   08/25/2019 Imaging   CT chest: IMPRESSION: Multiple areas of adenopathy involving the lower neck, left axilla, subcarinal station, and largest areas of nodal masses throughout the visualized retroperitoneum. Splenomegaly.   Scattered inflammatory stranding adjacent to the distal pancreatic body-tail. 11 cm lobulated cyst in the left kidney.   1.6 and 1.1 cm irregular nodules in the medial basilar segment of the right lower lobe.   09/01/2019 Pathology Results   FINAL MICROSCOPIC DIAGNOSIS:   A. LYMPH NODE, RIGHT SUPRACLAVICULAR, EXCISION:  - Angioimmunoblastic T-cell lymphoma.   COMMENT:   Sections reveal soft tissue with mixed inflammation and foreign body  giant cell reaction with focally entrapped abnormal lymph nodes. The  abnormal lymph nodes are effaced with numerous medium sized atypical  lymphocytes with clear cytoplasm. There are admixed eosinophils, plasma  cells, and smaller lymphocytes. There is increased vascularity. These  atypical cells extend into the surrounding tissue with the mixed soft  tissue reaction. The abnormal lymphocytes are positive for CD2, CD3,  CD4, PD1, CXCL13, bcl-6, bcl-2, and CD10. CD21 and CD23 highlight  disrupted follicular dendritic networks. There is weak CD30 expression  (25%). They are negative for CD7, CD8, CD15, and CD56. CD138 highlights  plasma cells that are polytypic by light chain in situ hybridization.  EBV in  situ hybridization is negative in the B-cell and T-cells. CD20  highlights residual cortical B-cells. GMS, AFB, and PAS are negative for  organisms. Flow cytometry ([id number]) reveals abundant T-cells with  decreased/absent CD7. PCR for T-cell receptor gamma gene rearrangement  is positive. Overall, these findings are consistent with involvement by  angioimmunoblastic T-cell lymphoma. A preliminary diagnosis was called  to Dr. Erik Obey on [id number]. Dr. Maylon Peppers was paged on 09/19/19 for the  final diagnosis.    09/08/2019 Initial Diagnosis   T-cell lymphoma ([place])   09/17/2019 Imaging   Pet: IMPRESSION: 1. Active lymphoma within the neck, chest, abdomen, and pelvis, as detailed above. ([place]) 4 2. Development of small bilateral pleural effusions and abdominal ascites, possibly related to fluid overload. 3. Coronary artery atherosclerosis. Aortic Atherosclerosis (ICD10-I70.0).   09/29/2019 -  Chemotherapy   The patient had DOXOrubicin (ADRIAMYCIN) chemo injection 130 mg, 50 mg/m2 = 130 mg, Intravenous,  Once, 0 of 6 cycles palonosetron (ALOXI) injection 0.25 mg, 0.25 mg, Intravenous,  Once, 0 of 6 cycles pegfilgrastim (NEULASTA ONPRO KIT) injection 6 mg, 6 mg, Subcutaneous, Once, 0 of 6 cycles cyclophosphamide (CYTOXAN) 1,940 mg in sodium chloride 0.9 % 250 mL chemo infusion, 750 mg/m2 = 1,940 mg, Intravenous,  Once, 0 of 6 cycles brentuximab vedotin (ADCETRIS) 180 mg in sodium chloride 0.9 % 100 mL chemo infusion, 180 mg (100 % of original dose 180 mg), Intravenous,  Once, 0 of 6 cycles Dose modification: 180 mg (original dose 180 mg, Cycle  1, Reason: Other (see comments), Comment: Maximum dose Adcetris ) fosaprepitant (EMEND) 150 mg, dexamethasone (DECADRON) 12 mg in sodium chloride 0.9 % 145 mL IVPB, , Intravenous,  Once, 0 of 6 cycles  for chemotherapy treatment.      I have reviewed the past medical history, past surgical history, social history and family history with the  patient and they are unchanged from previous note.  ALLERGIES:  is allergic to doxycycline.  MEDICATIONS:  Current Outpatient Medications  Medication Sig Dispense Refill  . aspirin 81 MG tablet Take 81 mg by mouth daily.    . carvedilol (COREG) 12.5 MG tablet Take 1 tablet (12.5 mg total) by mouth 2 (two) times daily with a meal. 60 tablet 6  . chlorthalidone (HYGROTON) 25 MG tablet Take 0.5 tablets (12.5 mg total) by mouth daily. 15 tablet 5  . hydrochlorothiazide (HYDRODIURIL) 25 MG tablet Take 25 mg by mouth daily.    Marland Kitchen ketoconazole (NIZORAL) 2 % cream Apply 1 application topically daily. (Patient taking differently: Apply 1 application topically daily as needed for irritation. ) 30 g 0  . losartan (COZAAR) 100 MG tablet Take 1 tablet (100 mg total) by mouth daily. 90 tablet 1  . metFORMIN (GLUCOPHAGE) 500 MG tablet Take 1 tablet (500 mg total) by mouth 2 (two) times daily with a meal. (Patient taking differently: Take 500 mg by mouth daily with breakfast. ) 180 tablet 1  . Omega-3 Fatty Acids (FISH OIL) 500 MG CAPS Take 1 capsule by mouth daily.    Marland Kitchen omeprazole (PRILOSEC) 20 MG capsule Take 1 capsule (20 mg total) by mouth daily. (Patient taking differently: Take 20 mg by mouth 2 (two) times daily before a meal. ) 90 capsule 3  . ondansetron (ZOFRAN) 8 MG tablet Take 1 tablet (8 mg total) by mouth every 8 (eight) hours as needed for nausea or vomiting. 30 tablet 0  . potassium chloride (KLOR-CON M10) 10 MEQ tablet Take 1 tablet (10 mEq total) by mouth daily. 90 tablet 1  . simvastatin (ZOCOR) 80 MG tablet Take 0.5 tablets (40 mg total) by mouth daily. 45 tablet 1   No current facility-administered medications for this visit.     PHYSICAL EXAMINATION: ECOG PERFORMANCE STATUS: {CHL ONC ECOG PS:[id number]}  There were no vitals filed for this visit. There is no height or weight on file to calculate BMI.  There were no vitals filed for this visit.  GENERAL: alert, no distress and  comfortable SKIN: skin color, texture, turgor are normal, no rashes or significant lesions EYES: conjunctiva are pink and non-injected, sclera clear OROPHARYNX: no exudate, no erythema; lips, buccal mucosa, and tongue normal  NECK: supple, non-tender LYMPH:  no palpable lymphadenopathy in the cervical LUNGS: clear to auscultation with normal breathing effort HEART: regular rate & rhythm and no murmurs and no lower extremity edema ABDOMEN: soft, non-tender, non-distended, normal bowel sounds Musculoskeletal: no cyanosis of digits and no clubbing  PSYCH: alert & oriented x 3, fluent speech NEURO: no focal motor/sensory deficits  LABORATORY DATA:  I have reviewed the data as listed    Component Value Date/Time   NA 131 (L) 09/09/2019 1301   K 4.2 09/09/2019 1301   CL 96 (L) 09/09/2019 1301   CO2 24 09/09/2019 1301   GLUCOSE 135 (H) 09/09/2019 1301   GLUCOSE 101 (H) 08/30/2006 1117   BUN 17 09/09/2019 1301   CREATININE 1.01 09/09/2019 1301   CREATININE 1.41 (H) 06/06/2019 1528   CALCIUM 9.5 09/09/2019 1301  PROT 7.0 09/09/2019 1301   ALBUMIN 3.7 09/09/2019 1301   AST 8 (L) 09/09/2019 1301   ALT 8 09/09/2019 1301   ALKPHOS 70 09/09/2019 1301   BILITOT 0.6 09/09/2019 1301   GFRNONAA >60 09/09/2019 1301   GFRAA >60 09/09/2019 1301    No results found for: SPEP, UPEP  Lab Results  Component Value Date   WBC 14.5 (H) 09/19/2019   NEUTROABS 12.8 (H) 09/19/2019   HGB 12.7 (L) 09/19/2019   HCT 39.1 09/19/2019   MCV 85.9 09/19/2019   PLT 351 09/19/2019      Chemistry      Component Value Date/Time   NA 131 (L) 09/09/2019 1301   K 4.2 09/09/2019 1301   CL 96 (L) 09/09/2019 1301   CO2 24 09/09/2019 1301   BUN 17 09/09/2019 1301   CREATININE 1.01 09/09/2019 1301   CREATININE 1.41 (H) 06/06/2019 1528      Component Value Date/Time   CALCIUM 9.5 09/09/2019 1301   ALKPHOS 70 09/09/2019 1301   AST 8 (L) 09/09/2019 1301   ALT 8 09/09/2019 1301   BILITOT 0.6 09/09/2019  1301       RADIOGRAPHIC STUDIES: I have personally reviewed the radiological images as listed below and agreed with the findings in the report. Ct Soft Tissue Neck W Contrast  Result Date: 08/25/2019 CLINICAL DATA:  Right neck swelling. EXAM: CT NECK WITH CONTRAST TECHNIQUE: Multidetector CT imaging of the neck was performed using the standard protocol following the bolus administration of intravenous contrast. CONTRAST:  32m ISOVUE-300 IOPAMIDOL (ISOVUE-300) INJECTION 61% COMPARISON:  None. FINDINGS: Pharynx and larynx: There is infiltration of the superficial and deep subcutaneous fat of the right neck. There are multiple nonenlarged and enlarged lymph nodes at levels 2-5 as well as in the supraclavicular region. The largest, a level 2 node, measures 4.3 x 2.3 cm. There is ill-defined low-attenuation within some nodes, which could reflect cystic change or necrosis. Much less pronounced changes are also present on the left. No pharyngeal or laryngeal mass identified. Palatine tonsillar calcifications are incidentally noted. Salivary glands: Unremarkable. Thyroid: Mildly enlarged.  Otherwise unremarkable. Lymph nodes: As above. Vascular: Not well opacified. Limited intracranial: No abnormal enhancement. Visualized orbits: Unremarkable. Mastoids and visualized paranasal sinuses: Aerated. Skeleton: Multilevel degenerative changes of the cervical spine. Upper chest: No apical lung mass. Other: None. IMPRESSION: Right greater than left cervical and supraclavicular adenopathy with some nodes demonstrating potential cystic change or necrosis. This may be infectious/inflammatory or neoplastic and tissue sampling is recommended. Electronically Signed   By: PMacy MisM.D.   On: 08/25/2019 14:14   Ct Chest W Contrast  Result Date: 08/25/2019 CLINICAL DATA:  Neck swelling EXAM: CT CHEST WITH CONTRAST TECHNIQUE: Multidetector CT imaging of the chest was performed during intravenous contrast administration.  CONTRAST:  733mISOVUE-300 IOPAMIDOL (ISOVUE-300) INJECTION 61% COMPARISON:  None. FINDINGS: Cardiovascular: Normal heart size. Dense coronary calcifications. No pericardial effusion. Mediastinum/Nodes: 3 cm subcarinal lymph node. Lower cervical-supraclavicular enlarged nodes are present bilaterally measuring up 1.6 cm. Lungs/Pleura: 1.6 and 1.1 cm irregular nodules in the medial basilar segment of the right lower lobe. Small amount of adjacent pleural thickening is present. No consolidation or pleural effusion. Small areas of bilateral apical paraseptal emphysema. Upper Abdomen: Bulky adenopathy in the upper retroperitoneum with largest node measuring 4 point 9 cm adjacent to the IVC and multiple nodal masses measuring greater than 3 cm. Splenomegaly measuring 14.2 cm in length. Scattered inflammatory stranding adjacent to the distal pancreatic body-tail.  11 cm lobulated cyst in the left kidney. Musculoskeletal: 2.8 cm enlarged node in the left axilla. IMPRESSION: Multiple areas of adenopathy involving the lower neck, left axilla, subcarinal station, and largest areas of nodal masses throughout the visualized retroperitoneum. Splenomegaly. Scattered inflammatory stranding adjacent to the distal pancreatic body-tail. 11 cm lobulated cyst in the left kidney. 1.6 and 1.1 cm irregular nodules in the medial basilar segment of the right lower lobe. Electronically Signed   By: Mancel Parsons M.D.   On: 08/25/2019 12:49   Pet, Initial (skull Base To Thigh)  Result Date: 09/17/2019 CLINICAL DATA:  Initial treatment strategy for T-cell lymphoma/leukemia. EXAM: NUCLEAR MEDICINE PET SKULL BASE TO THIGH TECHNIQUE: 14.6 mCi F-18 FDG was injected intravenously. Full-ring PET imaging was performed from the skull base to thigh after the radiotracer. CT data was obtained and used for attenuation correction and anatomic localization. Fasting blood glucose: 147 mg/dl COMPARISON:  Chest and neck CTs of 08/25/2019. FINDINGS:  Mediastinal blood pool activity: SUV max 3.1 Liver activity: SUV max 3.8 NECK: Bilateral hypermetabolic cervical adenopathy. An index right level 2 nodal conglomerate measures 5.4 x 3.0 cm and a S.U.V. max of 18.6 on 29/4. Incidental CT findings: Deferred to recent diagnostic CT. CHEST: Extensive hypermetabolic thoracic adenopathy. Subcarinal node measures 2.3 cm and a S.U.V. max of 19.2 on 78/4 Left axillary node measures 3.2 cm and a S.U.V. max of 18.1 on 69/4. Incidental CT findings: Deferred to recent diagnostic CT. New small bilateral pleural effusions. Right Port-A-Cath tip at low SVC. Aortic and coronary artery atherosclerosis. ABDOMEN/PELVIS: Extensive abdominal hypermetabolic adenopathy. An index left periaortic node measures 3.6 cm and a S.U.V. max of 20.3 on 129/4. Portal caval node measures 3.7 cm and a S.U.V. max of 22.9 on 120/4. An index right external iliac node measures 1.3 cm and a S.U.V. max of 19.2 on 171/4. Index left inguinal node measures 1.5 cm and a S.U.V. max of 15.3 on 192/4. No splenic hypermetabolism. Incidental CT findings: Small volume abdominopelvic ascites, new since the prior diagnostic CT. Interpolar left renal 9.4 cm cyst. Abdominal aortic atherosclerosis. SKELETON: No abnormal marrow activity. 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 IMPRESSION: 1. Active lymphoma within the neck, chest, abdomen, and pelvis, as detailed above. ([place]) 4 2. Development of small bilateral pleural effusions and abdominal ascites, possibly related to fluid overload. 3. Coronary artery atherosclerosis. Aortic Atherosclerosis (ICD10-I70.0). Electronically Signed   By: Abigail Miyamoto M.D.   On: 09/17/2019 10:23   Ct-gudied Biopsy  Result Date: 09/19/2019 INDICATION: T-cell lymphoma. Please perform CT-guided bone marrow biopsy for tissue diagnostic purposes. EXAM: CT-GUIDED BONE MARROW BIOPSY AND  ASPIRATION MEDICATIONS: None ANESTHESIA/SEDATION: Fentanyl 100 mcg IV; Versed 2 mg IV Sedation Time: 10 Minutes; The patient was continuously monitored during the procedure by the interventional radiology nurse under my direct supervision. COMPLICATIONS: None immediate. PROCEDURE: Informed consent was obtained from the patient following an explanation of the procedure, risks, benefits and alternatives. The patient understands, agrees and consents for the procedure. All questions were addressed. A time out was performed prior to the initiation of the procedure. The patient was positioned prone and non-contrast localization CT was performed of the pelvis to demonstrate the iliac marrow spaces. The operative site was prepped and draped in the usual sterile fashion. Under sterile conditions and local anesthesia, a 22 gauge spinal needle was utilized for procedural planning. Next, an 11 gauge coaxial bone biopsy needle was advanced into the  left iliac marrow space. Needle position was confirmed with CT imaging. Initially, bone marrow aspiration was performed. Next, a bone marrow biopsy was obtained with the 11 gauge outer bone marrow device. Samples were prepared with the cytotechnologist and deemed adequate. The needle was removed intact. Hemostasis was obtained with compression and a dressing was placed. The patient tolerated the procedure well without immediate post procedural complication. IMPRESSION: Successful CT guided left iliac bone marrow aspiration and core biopsy. Electronically Signed   By: Sandi Mariscal M.D.   On: 09/19/2019 10:39   Ct Bone Marrow Biopsy & Aspiration  Result Date: 09/19/2019 INDICATION: T-cell lymphoma. Please perform CT-guided bone marrow biopsy for tissue diagnostic purposes. EXAM: CT-GUIDED BONE MARROW BIOPSY AND ASPIRATION MEDICATIONS: None ANESTHESIA/SEDATION: Fentanyl 100 mcg IV; Versed 2 mg IV Sedation Time: 10 Minutes; The patient was continuously monitored during the procedure  by the interventional radiology nurse under my direct supervision. COMPLICATIONS: None immediate. PROCEDURE: Informed consent was obtained from the patient following an explanation of the procedure, risks, benefits and alternatives. The patient understands, agrees and consents for the procedure. All questions were addressed. A time out was performed prior to the initiation of the procedure. The patient was positioned prone and non-contrast localization CT was performed of the pelvis to demonstrate the iliac marrow spaces. The operative site was prepped and draped in the usual sterile fashion. Under sterile conditions and local anesthesia, a 22 gauge spinal needle was utilized for procedural planning. Next, an 11 gauge coaxial bone biopsy needle was advanced into the left iliac marrow space. Needle position was confirmed with CT imaging. Initially, bone marrow aspiration was performed. Next, a bone marrow biopsy was obtained with the 11 gauge outer bone marrow device. Samples were prepared with the cytotechnologist and deemed adequate. The needle was removed intact. Hemostasis was obtained with compression and a dressing was placed. The patient tolerated the procedure well without immediate post procedural complication. IMPRESSION: Successful CT guided left iliac bone marrow aspiration and core biopsy. Electronically Signed   By: Sandi Mariscal M.D.   On: 09/19/2019 10:39   Ir Port Placement ([place])  Result Date: 09/16/2019 CLINICAL DATA:  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 EXAM: IMPLANTED PORT A CATH PLACEMENT WITH ULTRASOUND AND FLUOROSCOPIC GUIDANCE ANESTHESIA/SEDATION: 3.0 mg IV Versed; 100 mcg IV Fentanyl Total Moderate Sedation Time:  30 minutes The patient's level of consciousness and physiologic status were continuously monitored during the procedure by Radiology nursing. Additional Medications: 2 g IV Ancef. FLUOROSCOPY TIME:  30 seconds.  25.8 mGy. PROCEDURE: The procedure,  risks, benefits, and alternatives were explained to the patient. Questions regarding the procedure were encouraged and answered. The patient understands and consents to the procedure. A time-out was performed prior to initiating the procedure. Ultrasound was utilized to confirm patency of the right internal jugular vein. The right neck and chest were prepped with chlorhexidine in a sterile fashion, and a sterile drape was applied covering the operative field. Maximum barrier sterile technique with sterile gowns and gloves were used for the procedure. Local anesthesia was provided with 1% lidocaine. After creating a small venotomy incision, a 21 gauge needle was advanced into the right internal jugular vein under direct, real-time ultrasound guidance. Ultrasound image documentation was performed. After securing guidewire access, an 8 Fr dilator was placed. A J-wire was kinked to measure appropriate catheter length. A subcutaneous port pocket was then created along the upper chest wall utilizing sharp and blunt dissection. Portable cautery was utilized.  The pocket was irrigated with sterile saline. A single lumen power injectable port was chosen for placement. The 8 Fr catheter was tunneled from the port pocket site to the venotomy incision. The port was placed in the pocket. External catheter was trimmed to appropriate length based on guidewire measurement. At the venotomy, an 8 Fr peel-away sheath was placed over a guidewire. The catheter was then placed through the sheath and the sheath removed. Final catheter positioning was confirmed and documented with a fluoroscopic spot image. The port was accessed with a needle and aspirated and flushed with heparinized saline. The access needle was removed. The venotomy and port pocket incisions were closed with subcutaneous 3-0 Monocryl and subcuticular 4-0 Vicryl. Dermabond was applied to both incisions. COMPLICATIONS: COMPLICATIONS None FINDINGS: After catheter placement,  the tip lies at the cavo-atrial junction. The catheter aspirates normally and is ready for immediate use. IMPRESSION: Placement of single lumen port a cath via right internal jugular vein. The catheter tip lies at the cavo-atrial junction. A power injectable port a cath was placed and is ready for immediate use. Electronically Signed   By: Aletta Edouard M.D.   On: 09/16/2019 15:51

## 2019-09-23 ENCOUNTER — Ambulatory Visit (INDEPENDENT_AMBULATORY_CARE_PROVIDER_SITE_OTHER): Payer: HMO | Admitting: Internal Medicine

## 2019-09-23 ENCOUNTER — Inpatient Hospital Stay: Payer: HMO

## 2019-09-23 ENCOUNTER — Encounter: Payer: Self-pay | Admitting: Internal Medicine

## 2019-09-23 ENCOUNTER — Telehealth: Payer: Self-pay | Admitting: Hematology

## 2019-09-23 ENCOUNTER — Ambulatory Visit (HOSPITAL_BASED_OUTPATIENT_CLINIC_OR_DEPARTMENT_OTHER)
Admission: RE | Admit: 2019-09-23 | Discharge: 2019-09-23 | Disposition: A | Discharge status: Home / Self Care | Payer: HMO | Source: Ambulatory Visit | Attending: Internal Medicine | Admitting: Internal Medicine

## 2019-09-23 ENCOUNTER — Other Ambulatory Visit (INDEPENDENT_AMBULATORY_CARE_PROVIDER_SITE_OTHER): Payer: HMO

## 2019-09-23 ENCOUNTER — Inpatient Hospital Stay: Payer: HMO | Admitting: Hematology

## 2019-09-23 VITALS — BP 116/65 | HR 96 | Temp 96.5°F | Resp 20 | Ht 72.0 in | Wt 304.5 lb

## 2019-09-23 DIAGNOSIS — R0600 Dyspnea, unspecified: Secondary | ICD-10-CM | POA: Insufficient documentation

## 2019-09-23 DIAGNOSIS — R05 Cough: Secondary | ICD-10-CM | POA: Diagnosis not present

## 2019-09-23 DIAGNOSIS — R0609 Other forms of dyspnea: Secondary | ICD-10-CM

## 2019-09-23 DIAGNOSIS — E1169 Type 2 diabetes mellitus with other specified complication: Secondary | ICD-10-CM

## 2019-09-23 DIAGNOSIS — R635 Abnormal weight gain: Secondary | ICD-10-CM

## 2019-09-23 DIAGNOSIS — R059 Cough, unspecified: Secondary | ICD-10-CM

## 2019-09-23 LAB — HEMOGLOBIN A1C: Hgb A1c MFr Bld: 6.1 % (ref 4.6–6.5)

## 2019-09-23 LAB — TSH: TSH: 2.07 u[IU]/mL (ref 0.35–4.50)

## 2019-09-23 LAB — SURGICAL PATHOLOGY

## 2019-09-23 MED ORDER — GLUCERNA 1.0 CAL/CARBSTEADY PO LIQD: 1.0000 | Freq: Every day | ORAL | 6 refills | Status: DC

## 2019-09-23 NOTE — Patient Instructions (Addendum)
Per our records you are due for an eye exam. Please contact your eye doctor to schedule an appointment. Please have them send copies of your office visit notes to [place]. Our fax number is [phone].  GO TO THE LAB : Get the blood work     GO TO THE FRONT DESK Schedule your next appointment   for a checkup in 4 weeks  STOP BY THE FIRST FLOOR:  get the XR   Stop Ensure, start Glucerna up to 3 cans daily

## 2019-09-23 NOTE — Telephone Encounter (Signed)
Called and spoke with patient regarding appointments added per 11/17 los

## 2019-09-23 NOTE — Progress Notes (Signed)
Subjective:    Patient ID: [name] [name], male    DOB: 05/29/1941, 78 y.o.   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 Note from hematology reviewed  His main concern is DOE, that started about a month ago.  No difficulty breathing at rest. Has a big lump on the neck but denies wheezing or stridor   Wt Readings from Last 3 Encounters:  09/23/19 (!) 304 lb 8 oz (138.1 kg)  09/19/19 290 lb (131.5 kg)  09/09/19 290 lb 12 oz (131.9 kg)    Review of Systems Denies chest pain, lower extremity edema or palpitations Admits to cough for a month with some sputum production Admits to nausea for several weeks without vomiting, diarrhea or blood in the stools.   Past Medical History:  Diagnosis Date  . Arthritis   . Complication of anesthesia     had spinal with knee surgeries  -"heart rate too low" for general  . Detached retina, left    L eye normal vision, reports that he has had a retina procedure in a doctor's office at [facility]   . Diabetes mellitus without complication ([place])   . Dysrhythmia    slight irregular rate  . GERD (gastroesophageal reflux disease)    hx no problems now  . H/O echocardiogram 2014  . History of hiatal hernia    ?20 yrs ago  . Hyperopia 2016  . Hypertension   . Lumbar stenosis with neurogenic claudication   . MG, ocular (myasthenia gravis) ([place])    ? of L eye,   . Prediabetes   . Presbyopia OU 2016    Past Surgical History:  Procedure Laterality Date  . COLONOSCOPY    . HERNIA REPAIR     bilateral inguinal hernia  . HERNIA REPAIR     umbilical  . IR IMAGING GUIDED PORT INSERTION  09/16/2019  . JOINT REPLACEMENT  2009   right knee  . LUMBAR LAMINECTOMY/DECOMPRESSION MICRODISCECTOMY N/A 06/06/2017   Procedure: Decompression L3-5, insitu fusion L3-5 ;  Surgeon: Melina Schools, MD;  Location: [facility];  Service: Orthopedics;   Laterality: N/A;  . LYMPH NODE BIOPSY Right 09/01/2019   Procedure: CERVICAL LYMPH NODE BIOPSY;  Surgeon: [name], MD;  Location: [facility];  Service: ENT;  Laterality: Right;  . MIDDLE EAR SURGERY Right    ear -patched hole in ear drum  . TOTAL KNEE ARTHROPLASTY  09/10/2012   Procedure: TOTAL KNEE ARTHROPLASTY;  Surgeon: Tobi Bastos, MD;  Location: [facility];  Service: Orthopedics;  Laterality: Left;  . TOTAL SHOULDER ARTHROPLASTY Right 06/22/2016   Procedure: RIGHT TOTAL SHOULDER ARTHROPLASTY;  Surgeon: [name], MD;  Location: [facility];  Service: Orthopedics;  Laterality: Right;    Social History   Socioeconomic History  . Marital status: Married    Spouse name: Not on file  . Number of children: 4  . Years of education: Not on file  . Highest education level: Not on file  Occupational History  . Occupation: Audiological scientist-- retired 2010  Social Needs  . Financial resource strain: Not on file  . Food insecurity    Worry: Not on file    Inability: Not on file  . Transportation needs    Medical: Not on file    Non-medical: Not on file  Tobacco Use  . Smoking status: Former Smoker  Packs/day: 0.50    Years: 2.00    Pack years: 1.00    Types: Cigarettes    Quit date: 06/09/1966    Years since quitting: 53.3  . Smokeless tobacco: Never Used  Substance and Sexual Activity  . Alcohol use: No    Alcohol/week: 0.0 standard drinks    Comment: none  . Drug use: No  . Sexual activity: Yes    Partners: Female  Lifestyle  . Physical activity    Days per week: Not on file    Minutes per session: Not on file  . Stress: Not on file  Relationships  . Social Herbalist on phone: Not on file    Gets together: Not on file    Attends religious service: Not on file    Active member of club or organization: Not on file    Attends meetings of clubs or organizations: Not on file    Relationship status: Not on file  . Intimate partner violence    Fear of current or  ex partner: Not on file    Emotionally abused: Not on file    Physically abused: Not on file    Forced sexual activity: Not on file  Other Topics Concern  . Not on file  Social History Narrative   married, lives w/ second wife, 2 living children, lost 2 kids     Has a small dog      Allergies as of 09/23/2019      Reactions   Doxycycline    Rash , See office visit 06/16/2019      Medication List       Accurate as of September 23, 2019 11:59 PM. If you have any questions, ask your nurse or doctor.        aspirin 81 MG tablet Take 81 mg by mouth daily.   carvedilol 12.5 MG tablet Commonly known as: COREG Take 1 tablet (12.5 mg total) by mouth 2 (two) times daily with a meal.   chlorthalidone 25 MG tablet Commonly known as: HYGROTON Take 0.5 tablets (12.5 mg total) by mouth daily.   Fish Oil 500 MG Caps Take 1 capsule by mouth daily.   Glucerna 1.0 Cal/CarbSteady Liqd Take 1 Can by mouth daily. Okay to drink up to 3 cans/bottles daily to replace meals as needed. What changed:   how much to take  when to take this  additional instructions Changed by: [name], MD   hydrochlorothiazide 25 MG tablet Commonly known as: HYDRODIURIL Take 25 mg by mouth daily.   ketoconazole 2 % cream Commonly known as: NIZORAL Apply 1 application topically daily. What changed:   when to take this  reasons to take this   losartan 100 MG tablet Commonly known as: COZAAR Take 1 tablet (100 mg total) by mouth daily.   metFORMIN 500 MG tablet Commonly known as: GLUCOPHAGE Take 1 tablet (500 mg total) by mouth 2 (two) times daily with a meal. What changed: when to take this   omeprazole 20 MG capsule Commonly known as: PriLOSEC Take 1 capsule (20 mg total) by mouth daily. What changed: when to take this   ondansetron 8 MG tablet Commonly known as: ZOFRAN Take 1 tablet (8 mg total) by mouth every 8 (eight) hours as needed for nausea or vomiting.   potassium chloride 10 MEQ  tablet Commonly known as: Klor-Con M10 Take 1 tablet (10 mEq total) by mouth daily.   simvastatin 80 MG tablet Commonly known  as: ZOCOR Take 0.5 tablets (40 mg total) by mouth daily.           Objective:   Physical Exam BP 116/65 (BP Location: Left Arm, Patient Position: Sitting, Cuff Size: Normal)   Pulse 96   Temp (!) 96.5 F (35.8 C) (Temporal)   Resp 20   Ht 6' (1.829 m)   Wt (!) 304 lb 8 oz (138.1 kg)   SpO2 99%   BMI 41.30 kg/m  General:   Well developed, looks very tired but in no acute distress, BMI noted.  HEENT:  Normocephalic . Face symmetric, atraumatic Healing surgical scar at the right supraclavicular area. Has a mobile mass at the submandibular area, slightly larger than a egg.  Not tender. Multiple small masses at the left supraclavicular area. Unable to assess JVD Lungs:  CTA B Normal respiratory effort, no intercostal retractions, no accessory muscle use. Heart: RRR,  no murmur.  no pretibial edema bilaterally. Calves symmetric and nontender. Abdomen:  Not distended, soft, non-tender. No rebound or rigidity.   Skin: Not pale. Not jaundice Neurologic:  alert & oriented X3.  Speech normal, gait appropriate for age and unassisted Psych--  Cognition and judgment appear intact.  Cooperative with normal attention span and concentration.  Behavior appropriate. No anxious or depressed appearing.     Assessment      Assessment DM HTN Hyperlipidemia Morbid obesity Snoring  Palpitations, PVCs, h/o bradycardia,s/p. 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 Work-up in process. DM: Currently on Metformin.  Check A1c. HTN: On carvedilol, chlorthalidone, losartan, potassium.  Has BMP satisfactory, BP okay today.  DOE: ROS + for  cough, I notice weight gain.  O2 sat is normal, last hemoglobin satisfactory. Although I am unable to assess the JVD, he does not seem to be grossly volume overloaded.  Echo 09/12/2019 showed normal EF. Etiology unclear, will get a chest x-ray to assess volume. DDx is large including PE (unlikely clinically, I spoke with the radiology, CT with contrast 08/25/2019 although suboptimal for vascular eval, did not show a PE), deconditioning, lymphoma, others. Weight gain: With no evidence of volume overload, has been taking Ensure, switch to Glucerna.  Check TSH RTC 4 weeks   This visit occurred during the SARS-CoV-2 public health emergency.  Safety protocols were in place, including screening questions prior to the visit, additional usage of staff PPE, and extensive cleaning of exam room while observing appropriate contact time as indicated for disinfecting solutions.

## 2019-09-23 NOTE — Progress Notes (Signed)
Pre visit review using our clinic review tool, if applicable. No additional management support is needed unless otherwise documented below in the visit note. 

## 2019-09-24 ENCOUNTER — Telehealth: Payer: Self-pay | Admitting: *Deleted

## 2019-09-24 ENCOUNTER — Other Ambulatory Visit: Payer: Self-pay | Admitting: Hematology

## 2019-09-24 DIAGNOSIS — C8440 Peripheral T-cell lymphoma, not classified, unspecified site: Secondary | ICD-10-CM

## 2019-09-24 LAB — BRAIN NATRIURETIC PEPTIDE: Pro B Natriuretic peptide (BNP): 115 pg/mL — ABNORMAL HIGH (ref 0.0–100.0)

## 2019-09-24 MED ORDER — HYDROCODONE-ACETAMINOPHEN 5-325 MG PO TABS: 1.0000 | ORAL_TABLET | Freq: Three times a day (TID) | ORAL | 0 refills | Status: DC | PRN

## 2019-09-24 NOTE — Telephone Encounter (Signed)
This nurse called [name] and informed her that Dr. Maylon Peppers sent a prescription for Norco to [facility]. She verbalized understanding.

## 2019-09-24 NOTE — Telephone Encounter (Signed)
[name] [name], daughter, called and is requesting pain medications for her dad. She stated,"it's hard for him to swallow, and if he had pain medications, it would help him. I don't understand why the doctor didn't give him any pain medications? He can't wait til next week to get pain meds." Instructed her that Dr. Maylon Peppers is not here today, but at the [facility]. I will send him a message. [facility] uses [facility] in [place], on [address]. Her cell phone is [phone].

## 2019-09-24 NOTE — Assessment & Plan Note (Signed)
T-cell lymphoma:  recently dx via biopsy of a neck mass, now under the care of oncology. Work-up in process. DM: Currently on Metformin.  Check A1c. HTN: On carvedilol, chlorthalidone, losartan, potassium.  Has BMP satisfactory, BP okay today. DOE: ROS + for  cough, I notice weight gain.  O2 sat is normal, last hemoglobin satisfactory. Although I am unable to assess the JVD, he does not seem to be grossly volume overloaded.  Echo 09/12/2019 showed normal EF. Etiology unclear, will get a chest x-ray to assess volume. DDx is large including PE (unlikely clinically, I spoke with the radiology, CT with contrast 08/25/2019 although suboptimal for vascular eval, did not show a PE), deconditioning, lymphoma, others. Weight gain: With no evidence of volume overload, has been taking Ensure, switch to Glucerna.  Check TSH RTC 4 weeks

## 2019-09-26 ENCOUNTER — Other Ambulatory Visit: Payer: Self-pay | Admitting: Internal Medicine

## 2019-09-26 ENCOUNTER — Telehealth: Payer: Self-pay

## 2019-09-26 ENCOUNTER — Encounter (HOSPITAL_COMMUNITY): Payer: Self-pay | Admitting: Hematology

## 2019-09-26 DIAGNOSIS — C8590 Non-Hodgkin lymphoma, unspecified, unspecified site: Secondary | ICD-10-CM

## 2019-09-26 DIAGNOSIS — R110 Nausea: Secondary | ICD-10-CM

## 2019-09-26 MED ORDER — ONDANSETRON HCL 8 MG PO TABS: 8.0000 mg | ORAL_TABLET | Freq: Three times a day (TID) | ORAL | 0 refills | Status: DC | PRN

## 2019-09-26 NOTE — Telephone Encounter (Signed)
Copied from [place] [id number]. Topic: General - Other >> Sep 26, 2019  3:21 PM Rainey Pines A wrote: Patients daughter [name] would like a callback from nurse today in regards to getting clarification on medications recently prescribed since patients last visit. Please advise [phone] or [phone]

## 2019-09-26 NOTE — Telephone Encounter (Signed)
Both numbers called: no answer

## 2019-09-26 NOTE — Telephone Encounter (Signed)
Pt's daughter [name] calling wanting to go over OV from the other day- can you call her back please (she is now on DPR).

## 2019-09-29 ENCOUNTER — Other Ambulatory Visit: Payer: Self-pay | Admitting: Hematology

## 2019-09-29 NOTE — Progress Notes (Addendum)
[facility] OFFICE PROGRESS NOTE  Patient Care Team: Colon Branch, MD as PCP - General [name], [name], MD as Consulting Physician (Cardiology) [name], MD as Consulting Physician (Gastroenterology) [name], MD as Consulting Physician (Orthopedic Surgery) [name], MD as Consulting Physician (Physical Medicine and Rehabilitation) Luretha Rued, RN as [facility] [name], MD as Consulting Physician (Otolaryngology)  HEME/ONC OVERVIEW: 1. Stage IV angioimmunoblastic T-cell lymphoma -07/2019: L neck LN FNA non-diagnostic  -08/2019:   Multiple enlarged cervical, mediastinal, bulky retroperitoneal LN's (~5cm), and irregular RLL nodule (1.6x1.1cm) on CT   Excisional left cervical LN bx, path showed angioimmunoblastic T-cell lymphoma, CD30+ (at least 25%)  FDG-avid LN's in the neck, chest and abdomen/pelvis; no bone involvement  Small lymphoid aggregates on the bone marrow biopsy, suspicious for lymphoma involvement  -09/2019 - present: Adcetris + CHP w/ G-CSF support  TREATMENT SUMMARY:  09/30/2019 - present: Adcetris + CHP w/ G-CSF support  ASSESSMENT & PLAN:   Stage IV angioimmunoblastic T-cell lymphoma -I independently reviewed the radiologic images of recent data, and agree with findings documented -In summary, PET showed FDG-avid LN's in the neck, chest, and abdomen/pelvis.  There was no definite bony involvement.  -I discussed the bone marrow biopsy results in detail with Dr. Gari Crown of pathology.  There were small lymphoid aggregates on the bone marrow biopsy, very suspicious for lymphoma involvement.  However, T-cell lymphoma involvement in the bone marrow can be difficult to confirm, and the TCR clonality may be low yield due to the small sample.  Given the extensive LN involvement, the bone marrow is presumed to be involved by AITL.  -I reviewed imaging and pathology results in detail with the patient, as  well as NCCN guideline -Given the angioimmunoblastic T-cell lymphoma was CD30+, I recommended Adcetris + CHP based on the ECHELON-2 trial -We discussed some of the risks, benefits and side-effects of Adcetris + CHP.  -Some of the short term side-effects included, though not limited to, risk of fatigue, weight loss, tumor lysis syndrome, risk of allergic reactions, pancytopenia, life-threatening infections, need for transfusions of blood products, nausea, vomiting, change in bowel habits, admission to hospital for various reasons, and risks of death.  -Long term side-effects are also discussed including permanent damage to nerve function, chronic fatigue, and rare secondary malignancy including bone marrow disorders.  -The patient is aware that the response rates discussed earlier is not guaranteed.   -After a long discussion, patient made an informed decision to proceed with the prescribed plan of care.  -He will need chemotherapy education prior to starting treatment  -We will schedule the 1st treatment to start on 09/30/2019  -I have also prescribed PRN anti-emetics, including Zofran, Compazine, and Ativan   Normocytic anemia -Likely due to anemia of chronic disease  -Hgb 12.3 today, stable  -Patient denies any symptoms of bleeding -Bone marrow biopsy results pending  -We will monitor it for now    AKI -Secondary to dehydration and hypotension  -Cr 3.58 today, significantly elevated -I discussed with pharmacy and verified appropriate chemotherapy doses -See fluid support as outlined below  Hypotension -Likely due to dehydration and poor oral intake, as well as multiple anti-hypertensive medications  -We will administer 1L NS today, and plan to continue IV fluid support through the reminder of the week  -I will reassess him next week to determine if he needs additional IV fluid support  -I have also consulted nutrition for further recommendations -Finally, I have  instructed the patient  to hold all anti-hypertensives and check his BP daily (goal BP at least > 120/80)  Diarrhea -Etiology unclear; possibly due to malabsorption vs constipation from recent opioid medication usage  -Given the distended abdomen, I have ordered KUB today  -I have also ordered C. Diff to rule out infection -If no evidence of infection or constipation, then he may try OTC Imodium   Orders Placed This Encounter  Procedures  . C difficile quick screen w PCR reflex    Standing Status:   Future    Standing Expiration Date:   09/29/2020  . Xray, abd (2 view)    Standing Status:   Future    Standing Expiration Date:   09/29/2020    Order Specific Question:   Reason for Exam (SYMPTOM  OR DIAGNOSIS REQUIRED)    Answer:   Abdominal pain, possible constipation    Order Specific Question:   Preferred imaging location?    Answer:   [facility] Specific Question:   Radiology Contrast Protocol - do NOT remove file path    Answer:   [email or web address]   All questions were answered. The patient knows to call the clinic with any problems, questions or concerns. No barriers to learning was detected.  A total of more than 40 minutes were spent face-to-face with the patient during this encounter and over half of that time was spent on counseling and coordination of care as outlined above.   Return in 1 week for labs, port flush, clinic appt and fluid support as needed.   Tish Men, MD 09/30/2019 9:41 AM  CHIEF COMPLAINT: "I am not doing so well"  INTERVAL HISTORY: Mr. Feagans returns to clinic for follow-up of recent bone marrow biopsy result and start of chemotherapy.  Patient reports that starting over the weekend, he has been having frequent diarrhea, 3-4x/day, small volume, without any hematochezia or melena.  He also reports associated lower abdominal pain.  He also has been persistently nauseous, and tried Phenergan with some improvement, but this has  limited his oral intake significantly.  He has chronic low back and hip pain, for which he was recently prescribed Norco 5/325 w/ modest relief.  He denies any other complaint today.  REVIEW OF SYSTEMS:   Constitutional: ( - ) fevers, ( - )  chills , ( - ) night sweats Eyes: ( - ) blurriness of vision, ( - ) double vision, ( - ) watery eyes Ears, nose, mouth, throat, and face: ( - ) mucositis, ( - ) sore throat Respiratory: ( - ) cough, ( - ) dyspnea, ( - ) wheezes Cardiovascular: ( - ) palpitation, ( - ) chest discomfort, ( - ) lower extremity swelling Gastrointestinal:  ( + ) nausea, ( - ) heartburn, ( + ) change in bowel habits Skin: ( - ) abnormal skin rashes Lymphatics: ( - ) new lymphadenopathy, ( - ) easy bruising Neurological: ( - ) numbness, ( - ) tingling, ( - ) new weaknesses Behavioral/Psych: ( - ) mood change, ( - ) new changes  All other systems were reviewed with the patient and are negative.  SUMMARY OF ONCOLOGIC HISTORY: Oncology History  Angioimmunoblastic lymphoma ([place])  08/25/2019 Imaging   CT neck: IMPRESSION: Right greater than left cervical and supraclavicular adenopathy with some nodes demonstrating potential cystic change or necrosis. This may be infectious/inflammatory or neoplastic and tissue sampling is recommended.   08/25/2019 Imaging   CT chest: IMPRESSION: Multiple areas  of adenopathy involving the lower neck, left axilla, subcarinal station, and largest areas of nodal masses throughout the visualized retroperitoneum. Splenomegaly.   Scattered inflammatory stranding adjacent to the distal pancreatic body-tail. 11 cm lobulated cyst in the left kidney.   1.6 and 1.1 cm irregular nodules in the medial basilar segment of the right lower lobe.   09/01/2019 Pathology Results   FINAL MICROSCOPIC DIAGNOSIS:   A. LYMPH NODE, RIGHT SUPRACLAVICULAR, EXCISION:  - Angioimmunoblastic T-cell lymphoma.   COMMENT:   Sections reveal soft tissue with mixed  inflammation and foreign body  giant cell reaction with focally entrapped abnormal lymph nodes. The  abnormal lymph nodes are effaced with numerous medium sized atypical  lymphocytes with clear cytoplasm. There are admixed eosinophils, plasma  cells, and smaller lymphocytes. There is increased vascularity. These  atypical cells extend into the surrounding tissue with the mixed soft  tissue reaction. The abnormal lymphocytes are positive for CD2, CD3,  CD4, PD1, CXCL13, bcl-6, bcl-2, and CD10. CD21 and CD23 highlight  disrupted follicular dendritic networks. There is weak CD30 expression  (25%). They are negative for CD7, CD8, CD15, and CD56. CD138 highlights  plasma cells that are polytypic by light chain in situ hybridization.  EBV in situ hybridization is negative in the B-cell and T-cells. CD20  highlights residual cortical B-cells. GMS, AFB, and PAS are negative for  organisms. Flow cytometry ([id number]) reveals abundant T-cells with  decreased/absent CD7. PCR for T-cell receptor gamma gene rearrangement  is positive. Overall, these findings are consistent with involvement by  angioimmunoblastic T-cell lymphoma. A preliminary diagnosis was called  to Dr. Erik Obey on 28/76/8115. Dr. Maylon Peppers was paged on 09/19/19 for the  final diagnosis.    09/08/2019 Initial Diagnosis   T-cell lymphoma ([place])   09/17/2019 Imaging   Pet: IMPRESSION: 1. Active lymphoma within the neck, chest, abdomen, and pelvis, as detailed above. ([place]) 4 2. Development of small bilateral pleural effusions and abdominal ascites, possibly related to fluid overload. 3. Coronary artery atherosclerosis. Aortic Atherosclerosis (ICD10-I70.0).   09/29/2019 -  Chemotherapy   The patient had DOXOrubicin (ADRIAMYCIN) chemo injection 130 mg, 50 mg/m2 = 130 mg, Intravenous,  Once, 0 of 6 cycles palonosetron (ALOXI) injection 0.25 mg, 0.25 mg, Intravenous,  Once, 0 of 6 cycles pegfilgrastim (NEULASTA ONPRO KIT) injection 6  mg, 6 mg, Subcutaneous, Once, 0 of 6 cycles cyclophosphamide (CYTOXAN) 1,940 mg in sodium chloride 0.9 % 250 mL chemo infusion, 750 mg/m2 = 1,940 mg, Intravenous,  Once, 0 of 6 cycles brentuximab vedotin (ADCETRIS) 180 mg in sodium chloride 0.9 % 100 mL chemo infusion, 180 mg (100 % of original dose 180 mg), Intravenous,  Once, 0 of 6 cycles Dose modification: 180 mg (original dose 180 mg, Cycle 1, Reason: Other (see comments), Comment: Maximum dose Adcetris ) fosaprepitant (EMEND) 150 mg, dexamethasone (DECADRON) 12 mg in sodium chloride 0.9 % 145 mL IVPB, , Intravenous,  Once, 0 of 6 cycles  for chemotherapy treatment.      I have reviewed the past medical history, past surgical history, social history and family history with the patient and they are unchanged from previous note.  ALLERGIES:  is allergic to doxycycline.  MEDICATIONS:  Current Outpatient Medications  Medication Sig Dispense Refill  . aspirin 81 MG tablet Take 81 mg by mouth daily.    . carvedilol (COREG) 12.5 MG tablet Take 1 tablet (12.5 mg total) by mouth 2 (two) times daily with a meal. 60 tablet 6  . chlorthalidone (HYGROTON) 25 MG  tablet Take 0.5 tablets (12.5 mg total) by mouth daily. 15 tablet 5  . hydrochlorothiazide (HYDRODIURIL) 25 MG tablet Take 25 mg by mouth daily.    Marland Kitchen HYDROcodone-acetaminophen (NORCO) 5-325 MG tablet Take 1 tablet by mouth every 8 (eight) hours as needed for moderate pain. 40 tablet 0  . ketoconazole (NIZORAL) 2 % cream Apply 1 application topically daily. (Patient taking differently: Apply 1 application topically daily as needed for irritation. ) 30 g 0  . losartan (COZAAR) 100 MG tablet Take 1 tablet (100 mg total) by mouth daily. 90 tablet 1  . metFORMIN (GLUCOPHAGE) 500 MG tablet Take 1 tablet (500 mg total) by mouth 2 (two) times daily with a meal. (Patient taking differently: Take 500 mg by mouth daily with breakfast. ) 180 tablet 1  . Nutritional Supplements (GLUCERNA 1.0  CAL/CARBSTEADY) LIQD Take 1 Can by mouth daily. Okay to drink up to 3 cans/bottles daily to replace meals as needed. 21330 mL 6  . Omega-3 Fatty Acids (FISH OIL) 500 MG CAPS Take 1 capsule by mouth daily.    Marland Kitchen omeprazole (PRILOSEC) 20 MG capsule Take 1 capsule (20 mg total) by mouth daily. (Patient taking differently: Take 20 mg by mouth 2 (two) times daily before a meal. ) 90 capsule 3  . ondansetron (ZOFRAN) 8 MG tablet Take 1 tablet (8 mg total) by mouth every 8 (eight) hours as needed for nausea or vomiting. 30 tablet 0  . potassium chloride (KLOR-CON M10) 10 MEQ tablet Take 1 tablet (10 mEq total) by mouth daily. 90 tablet 1  . simvastatin (ZOCOR) 80 MG tablet Take 0.5 tablets (40 mg total) by mouth daily. 45 tablet 1  . allopurinol (ZYLOPRIM) 300 MG tablet Take 1 tablet (300 mg total) by mouth daily. 30 tablet 3  . LORazepam (ATIVAN) 0.5 MG tablet Take 1 tablet (0.5 mg total) by mouth every 6 (six) hours as needed (Nausea or vomiting). 30 tablet 0  . ondansetron (ZOFRAN) 8 MG tablet Take 1 tablet (8 mg total) by mouth 2 (two) times daily as needed for refractory nausea / vomiting. Start on day 3 after cyclophosphamide chemotherapy. 30 tablet 1  . predniSONE (DELTASONE) 20 MG tablet Take 5 tablets (100 mg total) by mouth daily for 5 days. Take on days 1-5 of chemotherapy. 25 tablet 5  . prochlorperazine (COMPAZINE) 10 MG tablet Take 1 tablet (10 mg total) by mouth every 6 (six) hours as needed (Nausea or vomiting). 30 tablet 6  . traMADol (ULTRAM) 50 MG tablet Take 1 tablet (50 mg total) by mouth every 6 (six) hours as needed. 40 tablet 1   No current facility-administered medications for this visit.    Facility-Administered Medications Ordered in Other Visits  Medication Dose Route Frequency Provider Last Rate Last Dose  . 0.9 %  sodium chloride infusion   Intravenous Once Tish Men, MD 500 mL/hr at 09/30/19 0909      PHYSICAL EXAMINATION: ECOG PERFORMANCE STATUS: 2 - Symptomatic, <50%  confined to bed  Today's Vitals   09/30/19 0912  BP: (!) 77/50  Pulse: 66  Resp: 18  Temp: 98.2 F (36.8 C)  TempSrc: Temporal  SpO2: 95%  PainSc: 0-No pain   There is no height or weight on file to calculate BMI.  Filed Weights    GENERAL: alert, no distress, elderly and frail appearing  SKIN: skin color, texture, turgor are normal, no rashes or significant lesions EYES: conjunctiva are pink and non-injected, sclera clear NECK: supple, non-tender LYMPH:  bulky R cervical adenopathy, at least 5-6cm, firm, non-tender, shotty left supraclavicular lymphadenopathy LUNGS: clear to auscultation with normal breathing effort HEART: regular rate & rhythm, no murmurs, 1+ bilateral lower extremity edema ABDOMEN: slightly firm, lower abdominal discomfort with palpation, decreased bowel sounds Musculoskeletal: no cyanosis of digits and no clubbing  PSYCH: alert & oriented x 3, slightly slowed speech  LABORATORY DATA:  I have reviewed the data as listed    Component Value Date/Time   NA 131 (L) 09/09/2019 1301   K 4.2 09/09/2019 1301   CL 96 (L) 09/09/2019 1301   CO2 24 09/09/2019 1301   GLUCOSE 135 (H) 09/09/2019 1301   GLUCOSE 101 (H) 08/30/2006 1117   BUN 17 09/09/2019 1301   CREATININE 1.01 09/09/2019 1301   CREATININE 1.41 (H) 06/06/2019 1528   CALCIUM 9.5 09/09/2019 1301   PROT 7.0 09/09/2019 1301   ALBUMIN 3.7 09/09/2019 1301   AST 8 (L) 09/09/2019 1301   ALT 8 09/09/2019 1301   ALKPHOS 70 09/09/2019 1301   BILITOT 0.6 09/09/2019 1301   GFRNONAA >60 09/09/2019 1301   GFRAA >60 09/09/2019 1301    No results found for: SPEP, UPEP  Lab Results  Component Value Date   WBC 16.8 (H) 09/30/2019   NEUTROABS 14.4 (H) 09/30/2019   HGB 12.3 (L) 09/30/2019   HCT 37.3 (L) 09/30/2019   MCV 83.3 09/30/2019   PLT 248 09/30/2019      Chemistry      Component Value Date/Time   NA 131 (L) 09/09/2019 1301   K 4.2 09/09/2019 1301   CL 96 (L) 09/09/2019 1301   CO2 24  09/09/2019 1301   BUN 17 09/09/2019 1301   CREATININE 1.01 09/09/2019 1301   CREATININE 1.41 (H) 06/06/2019 1528      Component Value Date/Time   CALCIUM 9.5 09/09/2019 1301   ALKPHOS 70 09/09/2019 1301   AST 8 (L) 09/09/2019 1301   ALT 8 09/09/2019 1301   BILITOT 0.6 09/09/2019 1301       RADIOGRAPHIC STUDIES: I have personally reviewed the radiological images as listed below and agreed with the findings in the report. Dg Chest 2 View  Result Date: 09/23/2019 CLINICAL DATA:  Productive cough and dyspnea for 4 weeks.  Lymphoma. EXAM: CHEST - 2 VIEW COMPARISON:  06/16/2019 chest radiograph. FINDINGS: Right internal jugular Port-A-Cath terminates in the middle third of the SVC. Stable cardiomediastinal silhouette with normal heart size. No pneumothorax. Small bilateral pleural effusions. No pulmonary edema. Mild bibasilar atelectasis. IMPRESSION: Mild bibasilar atelectasis with small bilateral pleural effusions. Electronically Signed   By: Ilona Sorrel M.D.   On: 09/23/2019 13:45   Pet, Initial (skull Base To Thigh)  Result Date: 09/17/2019 CLINICAL DATA:  Initial treatment strategy for T-cell lymphoma/leukemia. EXAM: NUCLEAR MEDICINE PET SKULL BASE TO THIGH TECHNIQUE: 14.6 mCi F-18 FDG was injected intravenously. Full-ring PET imaging was performed from the skull base to thigh after the radiotracer. CT data was obtained and used for attenuation correction and anatomic localization. Fasting blood glucose: 147 mg/dl COMPARISON:  Chest and neck CTs of 08/25/2019. FINDINGS: Mediastinal blood pool activity: SUV max 3.1 Liver activity: SUV max 3.8 NECK: Bilateral hypermetabolic cervical adenopathy. An index right level 2 nodal conglomerate measures 5.4 x 3.0 cm and a S.U.V. max of 18.6 on 29/4. Incidental CT findings: Deferred to recent diagnostic CT. CHEST: Extensive hypermetabolic thoracic adenopathy. Subcarinal node measures 2.3 cm and a S.U.V. max of 19.2 on 78/4 Left axillary node measures 3.2  cm  and a S.U.V. max of 18.1 on 69/4. Incidental CT findings: Deferred to recent diagnostic CT. New small bilateral pleural effusions. Right Port-A-Cath tip at low SVC. Aortic and coronary artery atherosclerosis. ABDOMEN/PELVIS: Extensive abdominal hypermetabolic adenopathy. An index left periaortic node measures 3.6 cm and a S.U.V. max of 20.3 on 129/4. Portal caval node measures 3.7 cm and a S.U.V. max of 22.9 on 120/4. An index right external iliac node measures 1.3 cm and a S.U.V. max of 19.2 on 171/4. Index left inguinal node measures 1.5 cm and a S.U.V. max of 15.3 on 192/4. No splenic hypermetabolism. Incidental CT findings: Small volume abdominopelvic ascites, new since the prior diagnostic CT. Interpolar left renal 9.4 cm cyst. Abdominal aortic atherosclerosis. SKELETON: No abnormal marrow activity. 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 IMPRESSION: 1. Active lymphoma within the neck, chest, abdomen, and pelvis, as detailed above. ([place]) 4 2. Development of small bilateral pleural effusions and abdominal ascites, possibly related to fluid overload. 3. Coronary artery atherosclerosis. Aortic Atherosclerosis (ICD10-I70.0). Electronically Signed   By: Abigail Miyamoto M.D.   On: 09/17/2019 10:23   Ct-gudied Biopsy  Result Date: 09/19/2019 INDICATION: T-cell lymphoma. Please perform CT-guided bone marrow biopsy for tissue diagnostic purposes. EXAM: CT-GUIDED BONE MARROW BIOPSY AND ASPIRATION MEDICATIONS: None ANESTHESIA/SEDATION: Fentanyl 100 mcg IV; Versed 2 mg IV Sedation Time: 10 Minutes; The patient was continuously monitored during the procedure by the interventional radiology nurse under my direct supervision. COMPLICATIONS: None immediate. PROCEDURE: Informed consent was obtained from the patient following an explanation of the procedure, risks, benefits and alternatives. The patient understands,  agrees and consents for the procedure. All questions were addressed. A time out was performed prior to the initiation of the procedure. The patient was positioned prone and non-contrast localization CT was performed of the pelvis to demonstrate the iliac marrow spaces. The operative site was prepped and draped in the usual sterile fashion. Under sterile conditions and local anesthesia, a 22 gauge spinal needle was utilized for procedural planning. Next, an 11 gauge coaxial bone biopsy needle was advanced into the left iliac marrow space. Needle position was confirmed with CT imaging. Initially, bone marrow aspiration was performed. Next, a bone marrow biopsy was obtained with the 11 gauge outer bone marrow device. Samples were prepared with the cytotechnologist and deemed adequate. The needle was removed intact. Hemostasis was obtained with compression and a dressing was placed. The patient tolerated the procedure well without immediate post procedural complication. IMPRESSION: Successful CT guided left iliac bone marrow aspiration and core biopsy. Electronically Signed   By: Sandi Mariscal M.D.   On: 09/19/2019 10:39   Ct Bone Marrow Biopsy & Aspiration  Result Date: 09/19/2019 INDICATION: T-cell lymphoma. Please perform CT-guided bone marrow biopsy for tissue diagnostic purposes. EXAM: CT-GUIDED BONE MARROW BIOPSY AND ASPIRATION MEDICATIONS: None ANESTHESIA/SEDATION: Fentanyl 100 mcg IV; Versed 2 mg IV Sedation Time: 10 Minutes; The patient was continuously monitored during the procedure by the interventional radiology nurse under my direct supervision. COMPLICATIONS: None immediate. PROCEDURE: Informed consent was obtained from the patient following an explanation of the procedure, risks, benefits and alternatives. The patient understands, agrees and consents for the procedure. All questions were addressed. A time out was performed prior to the initiation of the procedure. The patient was positioned prone and  non-contrast localization CT was performed of the pelvis to demonstrate the iliac marrow spaces. The operative site was prepped and draped in the usual sterile  fashion. Under sterile conditions and local anesthesia, a 22 gauge spinal needle was utilized for procedural planning. Next, an 11 gauge coaxial bone biopsy needle was advanced into the left iliac marrow space. Needle position was confirmed with CT imaging. Initially, bone marrow aspiration was performed. Next, a bone marrow biopsy was obtained with the 11 gauge outer bone marrow device. Samples were prepared with the cytotechnologist and deemed adequate. The needle was removed intact. Hemostasis was obtained with compression and a dressing was placed. The patient tolerated the procedure well without immediate post procedural complication. IMPRESSION: Successful CT guided left iliac bone marrow aspiration and core biopsy. Electronically Signed   By: Sandi Mariscal M.D.   On: 09/19/2019 10:39   Ir Port Placement ([place])  Result Date: 09/16/2019 CLINICAL DATA:  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 EXAM: IMPLANTED PORT A CATH PLACEMENT WITH ULTRASOUND AND FLUOROSCOPIC GUIDANCE ANESTHESIA/SEDATION: 3.0 mg IV Versed; 100 mcg IV Fentanyl Total Moderate Sedation Time:  30 minutes The patient's level of consciousness and physiologic status were continuously monitored during the procedure by Radiology nursing. Additional Medications: 2 g IV Ancef. FLUOROSCOPY TIME:  30 seconds.  25.8 mGy. PROCEDURE: The procedure, risks, benefits, and alternatives were explained to the patient. Questions regarding the procedure were encouraged and answered. The patient understands and consents to the procedure. A time-out was performed prior to initiating the procedure. Ultrasound was utilized to confirm patency of the right internal jugular vein. The right neck and chest were prepped with chlorhexidine in a sterile fashion, and a sterile drape was applied  covering the operative field. Maximum barrier sterile technique with sterile gowns and gloves were used for the procedure. Local anesthesia was provided with 1% lidocaine. After creating a small venotomy incision, a 21 gauge needle was advanced into the right internal jugular vein under direct, real-time ultrasound guidance. Ultrasound image documentation was performed. After securing guidewire access, an 8 Fr dilator was placed. A J-wire was kinked to measure appropriate catheter length. A subcutaneous port pocket was then created along the upper chest wall utilizing sharp and blunt dissection. Portable cautery was utilized. The pocket was irrigated with sterile saline. A single lumen power injectable port was chosen for placement. The 8 Fr catheter was tunneled from the port pocket site to the venotomy incision. The port was placed in the pocket. External catheter was trimmed to appropriate length based on guidewire measurement. At the venotomy, an 8 Fr peel-away sheath was placed over a guidewire. The catheter was then placed through the sheath and the sheath removed. Final catheter positioning was confirmed and documented with a fluoroscopic spot image. The port was accessed with a needle and aspirated and flushed with heparinized saline. The access needle was removed. The venotomy and port pocket incisions were closed with subcutaneous 3-0 Monocryl and subcuticular 4-0 Vicryl. Dermabond was applied to both incisions. COMPLICATIONS: COMPLICATIONS None FINDINGS: After catheter placement, the tip lies at the cavo-atrial junction. The catheter aspirates normally and is ready for immediate use. IMPRESSION: Placement of single lumen port a cath via right internal jugular vein. The catheter tip lies at the cavo-atrial junction. A power injectable port a cath was placed and is ready for immediate use. Electronically Signed   By: Aletta Edouard M.D.   On: 09/16/2019 15:51

## 2019-09-29 NOTE — Telephone Encounter (Signed)
Spoke with [name] [name], explained that that I saw him with  DOE and weight gain. [name] [name] reports that he is actually having some nausea and weight loss which is different from what I thought. He has an appointment with oncology tomorrow and I encouraged her to go to the visit herself to  understand well [name]'s medical situation. She was appreciative of my call

## 2019-09-30 ENCOUNTER — Other Ambulatory Visit: Payer: Self-pay

## 2019-09-30 ENCOUNTER — Inpatient Hospital Stay: Payer: HMO | Admitting: Nutrition

## 2019-09-30 ENCOUNTER — Telehealth: Payer: Self-pay | Admitting: *Deleted

## 2019-09-30 ENCOUNTER — Encounter: Payer: Self-pay | Admitting: Hematology

## 2019-09-30 ENCOUNTER — Encounter (HOSPITAL_BASED_OUTPATIENT_CLINIC_OR_DEPARTMENT_OTHER): Payer: Self-pay

## 2019-09-30 ENCOUNTER — Inpatient Hospital Stay: Payer: HMO | Attending: Hematology

## 2019-09-30 ENCOUNTER — Inpatient Hospital Stay: Payer: HMO

## 2019-09-30 ENCOUNTER — Ambulatory Visit (HOSPITAL_BASED_OUTPATIENT_CLINIC_OR_DEPARTMENT_OTHER)
Admission: RE | Admit: 2019-09-30 | Discharge: 2019-09-30 | Disposition: A | Discharge status: Home / Self Care | Payer: HMO | Source: Ambulatory Visit | Attending: Hematology | Admitting: Hematology

## 2019-09-30 ENCOUNTER — Encounter: Payer: Self-pay | Admitting: *Deleted

## 2019-09-30 ENCOUNTER — Inpatient Hospital Stay (HOSPITAL_BASED_OUTPATIENT_CLINIC_OR_DEPARTMENT_OTHER): Payer: HMO | Admitting: Hematology

## 2019-09-30 VITALS — BP 77/50 | HR 66 | Temp 98.2°F | Resp 18

## 2019-09-30 DIAGNOSIS — Z5189 Encounter for other specified aftercare: Secondary | ICD-10-CM | POA: Insufficient documentation

## 2019-09-30 DIAGNOSIS — Z5111 Encounter for antineoplastic chemotherapy: Secondary | ICD-10-CM | POA: Diagnosis not present

## 2019-09-30 DIAGNOSIS — N179 Acute kidney failure, unspecified: Secondary | ICD-10-CM | POA: Insufficient documentation

## 2019-09-30 DIAGNOSIS — R197 Diarrhea, unspecified: Secondary | ICD-10-CM | POA: Diagnosis not present

## 2019-09-30 DIAGNOSIS — I959 Hypotension, unspecified: Secondary | ICD-10-CM | POA: Diagnosis not present

## 2019-09-30 DIAGNOSIS — C8440 Peripheral T-cell lymphoma, not classified, unspecified site: Secondary | ICD-10-CM

## 2019-09-30 DIAGNOSIS — Z5112 Encounter for antineoplastic immunotherapy: Secondary | ICD-10-CM | POA: Insufficient documentation

## 2019-09-30 DIAGNOSIS — R110 Nausea: Secondary | ICD-10-CM

## 2019-09-30 LAB — CBC WITH DIFFERENTIAL (CANCER CENTER ONLY)
Abs Immature Granulocytes: 0.15 10*3/uL — ABNORMAL HIGH (ref 0.00–0.07)
Basophils Absolute: 0.1 10*3/uL (ref 0.0–0.1)
Basophils Relative: 0 %
Eosinophils Absolute: 0.6 10*3/uL — ABNORMAL HIGH (ref 0.0–0.5)
Eosinophils Relative: 3 %
HCT: 37.3 % — ABNORMAL LOW (ref 39.0–52.0)
Hemoglobin: 12.3 g/dL — ABNORMAL LOW (ref 13.0–17.0)
Immature Granulocytes: 1 %
Lymphocytes Relative: 5 %
Lymphs Abs: 0.8 10*3/uL (ref 0.7–4.0)
MCH: 27.5 pg (ref 26.0–34.0)
MCHC: 33 g/dL (ref 30.0–36.0)
MCV: 83.3 fL (ref 80.0–100.0)
Monocytes Absolute: 0.8 10*3/uL (ref 0.1–1.0)
Monocytes Relative: 5 %
Neutro Abs: 14.4 10*3/uL — ABNORMAL HIGH (ref 1.7–7.7)
Neutrophils Relative %: 86 %
Platelet Count: 248 10*3/uL (ref 150–400)
RBC: 4.48 MIL/uL (ref 4.22–5.81)
RDW: 15.3 % (ref 11.5–15.5)
WBC Count: 16.8 10*3/uL — ABNORMAL HIGH (ref 4.0–10.5)
nRBC: 0 % (ref 0.0–0.2)

## 2019-09-30 LAB — CMP (CANCER CENTER ONLY)
ALT: 8 U/L (ref 0–44)
AST: 13 U/L — ABNORMAL LOW (ref 15–41)
Albumin: 3.3 g/dL — ABNORMAL LOW (ref 3.5–5.0)
Alkaline Phosphatase: 82 U/L (ref 38–126)
Anion gap: 15 (ref 5–15)
BUN: 61 mg/dL — ABNORMAL HIGH (ref 8–23)
CO2: 25 mmol/L (ref 22–32)
Calcium: 8.3 mg/dL — ABNORMAL LOW (ref 8.9–10.3)
Chloride: 88 mmol/L — ABNORMAL LOW (ref 98–111)
Creatinine: 3.58 mg/dL (ref 0.61–1.24)
GFR, Est AFR Am: 18 mL/min — ABNORMAL LOW (ref >60)
GFR, Estimated: 15 mL/min — ABNORMAL LOW (ref >60)
Glucose, Bld: 122 mg/dL — ABNORMAL HIGH (ref 70–99)
Potassium: 4.2 mmol/L (ref 3.5–5.1)
Sodium: 128 mmol/L — ABNORMAL LOW (ref 135–145)
Total Bilirubin: 0.6 mg/dL (ref 0.3–1.2)
Total Protein: 6.4 g/dL — ABNORMAL LOW (ref 6.5–8.1)

## 2019-09-30 MED ORDER — SODIUM CHLORIDE 0.9 % IV SOLN
135.0000 mg | Freq: Once | INTRAVENOUS | Status: AC
  Administered 2019-09-30: 135 mg via INTRAVENOUS
  Filled 2019-09-30: qty 27

## 2019-09-30 MED ORDER — PALONOSETRON HCL INJECTION 0.25 MG/5ML
INTRAVENOUS | Status: AC
  Filled 2019-09-30: qty 5

## 2019-09-30 MED ORDER — PALONOSETRON HCL INJECTION 0.25 MG/5ML
0.2500 mg | Freq: Once | INTRAVENOUS | Status: AC
  Administered 2019-09-30: 0.25 mg via INTRAVENOUS

## 2019-09-30 MED ORDER — ALLOPURINOL 300 MG PO TABS: 300.0000 mg | ORAL_TABLET | Freq: Every day | ORAL | 3 refills | Status: DC

## 2019-09-30 MED ORDER — LORAZEPAM 0.5 MG PO TABS: 0.5000 mg | ORAL_TABLET | Freq: Four times a day (QID) | ORAL | 0 refills | Status: DC | PRN

## 2019-09-30 MED ORDER — SODIUM CHLORIDE 0.9 % IV SOLN
Freq: Once | INTRAVENOUS | Status: AC
  Administered 2019-09-30: 09:00:00 via INTRAVENOUS
  Filled 2019-09-30: qty 250

## 2019-09-30 MED ORDER — SODIUM CHLORIDE 0.9% FLUSH
10.0000 mL | INTRAVENOUS | Status: DC | PRN
  Administered 2019-09-30: 10 mL
  Filled 2019-09-30: qty 10

## 2019-09-30 MED ORDER — ONDANSETRON HCL 8 MG PO TABS: 8.0000 mg | ORAL_TABLET | Freq: Two times a day (BID) | ORAL | 1 refills | Status: DC | PRN

## 2019-09-30 MED ORDER — HEPARIN SOD (PORK) LOCK FLUSH 100 UNIT/ML IV SOLN
500.0000 [IU] | Freq: Once | INTRAVENOUS | Status: AC | PRN
  Administered 2019-09-30: 500 [IU]
  Filled 2019-09-30: qty 5

## 2019-09-30 MED ORDER — PROCHLORPERAZINE MALEATE 10 MG PO TABS: 10.0000 mg | ORAL_TABLET | Freq: Four times a day (QID) | ORAL | 6 refills | Status: DC | PRN

## 2019-09-30 MED ORDER — PEGFILGRASTIM 6 MG/0.6ML ~~LOC~~ PSKT
6.0000 mg | PREFILLED_SYRINGE | Freq: Once | SUBCUTANEOUS | Status: AC
  Administered 2019-09-30: 6 mg via SUBCUTANEOUS

## 2019-09-30 MED ORDER — TRAMADOL HCL 50 MG PO TABS: 50.0000 mg | ORAL_TABLET | Freq: Four times a day (QID) | ORAL | 1 refills | Status: DC | PRN

## 2019-09-30 MED ORDER — SODIUM CHLORIDE 0.9 % IV SOLN
Freq: Once | INTRAVENOUS | Status: AC
  Administered 2019-09-30: 11:00:00 via INTRAVENOUS
  Filled 2019-09-30: qty 5

## 2019-09-30 MED ORDER — DOXORUBICIN HCL CHEMO IV INJECTION 2 MG/ML
50.0000 mg/m2 | Freq: Once | INTRAVENOUS | Status: AC
  Administered 2019-09-30: 130 mg via INTRAVENOUS
  Filled 2019-09-30: qty 65

## 2019-09-30 MED ORDER — PEGFILGRASTIM 6 MG/0.6ML ~~LOC~~ PSKT
PREFILLED_SYRINGE | SUBCUTANEOUS | Status: AC
  Filled 2019-09-30: qty 0.6

## 2019-09-30 MED ORDER — PREDNISONE 20 MG PO TABS: 100.0000 mg | ORAL_TABLET | Freq: Every day | ORAL | 5 refills | Status: AC

## 2019-09-30 MED ORDER — SODIUM CHLORIDE 0.9% FLUSH
10.0000 mL | Freq: Once | INTRAVENOUS | Status: AC
  Administered 2019-09-30: 10 mL
  Filled 2019-09-30: qty 10

## 2019-09-30 MED ORDER — SODIUM CHLORIDE 0.9 % IV SOLN
750.0000 mg/m2 | Freq: Once | INTRAVENOUS | Status: AC
  Administered 2019-09-30: 1940 mg via INTRAVENOUS
  Filled 2019-09-30: qty 97

## 2019-09-30 NOTE — Progress Notes (Signed)
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  He is a patient of Dr. Maylon Peppers.  Treatment:Adcetris + CHP w/ G-CSF support   PMH includes DM, GERD, HTN  Medications include Glucophage, Fish Oil, Prilosec, Zofran, and Zocor.  Labs include HgA1c 6.1, Na 128, Glucose 122, BUN 61, Creat 3.58 and Alb 3.3  Height: 6'0" Weight: 304.5 pounds UBW: 290 - 311 pounds between August and November. BMI:41.3  Patient reports he has been unable to eat or drink secondary to nausea and lower abdominal pain. Noted 3-4 stools/diarrhea since the weekend. KUB pending. He will drink 1/2 bottle of Glucerna daily but doesn't like it. States he likes the [employer] the best. Daughter states she "knows how to feed him".  Nutrition Diagnosis: Inadequate oral intake related to nausea, diarrhea and pain as evidenced by dehydration and patient's self report of little to no oral intake.  Intervention: Educated on how to improve oral intake with nausea. Encouraged medications as needed. Reviewed strategies on eating small frequent meals and snacks. Reviewed ways to increase hydration. Await results of KUB and c-diff. Encouraged ONS at least 2 times daily. Provided fact sheets, contact information, and coupons. Questions answered and teach back method used.  Monitoring, Evaluation, Goals: Patient will tolerate adequate calories and protein to maintain lean body mass and maintain hydration  Next Visit: Patient will contact me with questions.

## 2019-09-30 NOTE — Patient Instructions (Signed)
[facility] Discharge Instructions for Patients Receiving Chemotherapy  Today you received the following chemotherapy agents Adriamycin, Cytoxan, Adcetris  To help prevent nausea and vomiting after your treatment, we encourage you to take your nausea medication  Refer to Dr. Lorette Ang notes on nausea meds.   Begin taking Prednisone every morning with meals for 5 days. Take Compazine by mouth every 6 hours as needed for nausea or vomiting Can take Ativan every 6 hours as needed for nausea or vomiting Beginning Friday if  Still experiencing nausea can take Zofran for nausea   If you develop nausea and vomiting that is not controlled by your nausea medication, call the clinic.   BELOW ARE SYMPTOMS THAT SHOULD BE REPORTED IMMEDIATELY:  *FEVER GREATER THAN 100.5 F  *CHILLS WITH OR WITHOUT FEVER  NAUSEA AND VOMITING THAT IS NOT CONTROLLED WITH YOUR NAUSEA MEDICATION  *UNUSUAL SHORTNESS OF BREATH  *UNUSUAL BRUISING OR BLEEDING  TENDERNESS IN MOUTH AND THROAT WITH OR WITHOUT PRESENCE OF ULCERS  *URINARY PROBLEMS  *BOWEL PROBLEMS  UNUSUAL RASH Items with * indicate a potential emergency and should be followed up as soon as possible.  Feel free to call the clinic should you have any questions or concerns. The clinic phone number is [phone].  Please show the [facility] at check-in to the Emergency Department and triage nurse.

## 2019-09-30 NOTE — Progress Notes (Signed)
Ok to treat today per Dr Maylon Peppers.  Creatine noted and IVFluids will continue all day during treatment.  2 L to be infused today.

## 2019-09-30 NOTE — Telephone Encounter (Signed)
Dr. Maylon Peppers notified of creat-3.58.  Orders received for pt to come in every day this week for IVF's and to be re-evaluated next week per Dr. Maylon Peppers.

## 2019-10-01 ENCOUNTER — Inpatient Hospital Stay: Payer: HMO

## 2019-10-01 ENCOUNTER — Telehealth: Payer: Self-pay

## 2019-10-01 VITALS — BP 128/57 | HR 63 | Resp 22

## 2019-10-01 DIAGNOSIS — Z5112 Encounter for antineoplastic immunotherapy: Secondary | ICD-10-CM | POA: Diagnosis not present

## 2019-10-01 DIAGNOSIS — C8440 Peripheral T-cell lymphoma, not classified, unspecified site: Secondary | ICD-10-CM

## 2019-10-01 MED ORDER — HEPARIN SOD (PORK) LOCK FLUSH 100 UNIT/ML IV SOLN
500.0000 [IU] | Freq: Once | INTRAVENOUS | Status: DC | PRN
  Filled 2019-10-01: qty 5

## 2019-10-01 MED ORDER — SODIUM CHLORIDE 0.9% FLUSH
10.0000 mL | Freq: Once | INTRAVENOUS | Status: AC | PRN
  Administered 2019-10-01: 10:00:00 10 mL
  Filled 2019-10-01: qty 10

## 2019-10-01 MED ORDER — PROMETHAZINE HCL 25 MG/ML IJ SOLN: 25.0000 mg | Freq: Once | INTRAMUSCULAR | Status: DC

## 2019-10-01 MED ORDER — SODIUM CHLORIDE 0.9 % IV SOLN
Freq: Once | INTRAVENOUS | Status: AC
  Administered 2019-10-01: 10:00:00 via INTRAVENOUS
  Filled 2019-10-01: qty 250

## 2019-10-01 NOTE — Telephone Encounter (Signed)
Spoke w/ Pt's daughter [name]- discussed meds- she will start helping Pt get his meds together in pill boxes for him. Answered questions she currently had and she thanked me for my help.

## 2019-10-01 NOTE — Patient Instructions (Signed)
Dehydration, Adult  Dehydration is when there is not enough fluid or water in your body. This happens when you lose more fluids than you take in. Dehydration can range from mild to very bad. It should be treated right away to keep it from getting very bad. Symptoms of mild dehydration may include:  Thirst.  Dry lips.  Slightly dry mouth.  Dry, warm skin.  Dizziness. Symptoms of moderate dehydration may include:  Very dry mouth.  Muscle cramps.  Dark pee (urine). Pee may be the color of tea.  Your body making less pee.  Your eyes making fewer tears.  Heartbeat that is uneven or faster than normal (palpitations).  Headache.  Light-headedness, especially when you stand up from sitting.  Fainting (syncope). Symptoms of very bad dehydration may include:  Changes in skin, such as: ? Cold and clammy skin. ? Blotchy (mottled) or pale skin. ? Skin that does not quickly return to normal after being lightly pinched and let go (poor skin turgor).  Changes in body fluids, such as: ? Feeling very thirsty. ? Your eyes making fewer tears. ? Not sweating when body temperature is high, such as in hot weather. ? Your body making very little pee.  Changes in vital signs, such as: ? Weak pulse. ? Pulse that is more than 100 beats a minute when you are sitting still. ? Fast breathing. ? Low blood pressure.  Other changes, such as: ? Sunken eyes. ? Cold hands and feet. ? Confusion. ? Lack of energy (lethargy). ? Trouble waking up from sleep. ? Short-term weight loss. ? Unconsciousness. Follow these instructions at home:   If told by your doctor, drink an ORS: ? Make an ORS by using instructions on the package. ? Start by drinking small amounts, about  cup (120 mL) every 5-10 minutes. ? Slowly drink more until you have had the amount that your doctor said to have.  Drink enough clear fluid to keep your pee clear or pale yellow. If you were told to drink an ORS, finish the  ORS first, then start slowly drinking clear fluids. Drink fluids such as: ? Water. Do not drink only water by itself. Doing that can make the salt (sodium) level in your body get too low (hyponatremia). ? Ice chips. ? Fruit juice that you have added water to (diluted). ? Low-calorie sports drinks.  Avoid: ? Alcohol. ? Drinks that have a lot of sugar. These include high-calorie sports drinks, fruit juice that does not have water added, and soda. ? Caffeine. ? Foods that are greasy or have a lot of fat or sugar.  Take over-the-counter and prescription medicines only as told by your doctor.  Do not take salt tablets. Doing that can make the salt level in your body get too high (hypernatremia).  Eat foods that have minerals (electrolytes). Examples include bananas, oranges, potatoes, tomatoes, and spinach.  Keep all follow-up visits as told by your doctor. This is important. Contact a doctor if:  You have belly (abdominal) pain that: ? Gets worse. ? Stays in one area (localizes).  You have a rash.  You have a stiff neck.  You get angry or annoyed more easily than normal (irritability).  You are more sleepy than normal.  You have a harder time waking up than normal.  You feel: ? Weak. ? Dizzy. ? Very thirsty.  You have peed (urinated) only a small amount of very dark pee during 6-8 hours. Get help right away if:  You have   symptoms of very bad dehydration.  You cannot drink fluids without throwing up (vomiting).  Your symptoms get worse with treatment.  You have a fever.  You have a very bad headache.  You are throwing up or having watery poop (diarrhea) and it: ? Gets worse. ? Does not go away.  You have blood or something green (bile) in your throw-up.  You have blood in your poop (stool). This may cause poop to look black and tarry.  You have not peed in 6-8 hours.  You pass out (faint).  Your heart rate when you are sitting still is more than 100 beats a  minute.  You have trouble breathing. This information is not intended to replace advice given to you by your health care provider. Make sure you discuss any questions you have with your health care provider. Document Released: 08/05/2009 Document Revised: 09/21/2017 Document Reviewed: 12/03/2015 Elsevier Patient Education  2020 Elsevier Inc.  

## 2019-10-01 NOTE — Telephone Encounter (Signed)
Copied from [place] [id number]. Topic: General - Other >> Oct 01, 2019  2:10 PM Leward Quan A wrote: Reason for CRM: Patient daughter [name] called to ask for a call from Dr [name] nurse to help organize his medications. She need help figuring out what medications he should be taking daily. Please call her at Ph# [phone] or [phone]

## 2019-10-02 ENCOUNTER — Other Ambulatory Visit: Payer: Self-pay

## 2019-10-02 ENCOUNTER — Inpatient Hospital Stay: Payer: HMO

## 2019-10-02 ENCOUNTER — Ambulatory Visit (HOSPITAL_BASED_OUTPATIENT_CLINIC_OR_DEPARTMENT_OTHER)
Admission: RE | Admit: 2019-10-02 | Discharge: 2019-10-02 | Disposition: A | Discharge status: Home / Self Care | Payer: HMO | Source: Ambulatory Visit | Attending: Hematology | Admitting: Hematology

## 2019-10-02 ENCOUNTER — Other Ambulatory Visit: Payer: Self-pay | Admitting: Hematology

## 2019-10-02 VITALS — BP 108/58 | HR 66 | Temp 97.9°F | Resp 22

## 2019-10-02 DIAGNOSIS — C8440 Peripheral T-cell lymphoma, not classified, unspecified site: Secondary | ICD-10-CM

## 2019-10-02 DIAGNOSIS — R197 Diarrhea, unspecified: Secondary | ICD-10-CM | POA: Diagnosis not present

## 2019-10-02 DIAGNOSIS — R140 Abdominal distension (gaseous): Secondary | ICD-10-CM | POA: Diagnosis not present

## 2019-10-02 DIAGNOSIS — Z5112 Encounter for antineoplastic immunotherapy: Secondary | ICD-10-CM | POA: Diagnosis not present

## 2019-10-02 DIAGNOSIS — Z95828 Presence of other vascular implants and grafts: Secondary | ICD-10-CM

## 2019-10-02 MED ORDER — HEPARIN SOD (PORK) LOCK FLUSH 100 UNIT/ML IV SOLN
500.0000 [IU] | Freq: Once | INTRAVENOUS | Status: AC
  Administered 2019-10-02: 500 [IU] via INTRAVENOUS
  Filled 2019-10-02: qty 5

## 2019-10-02 MED ORDER — SODIUM CHLORIDE 0.9% FLUSH
10.0000 mL | Freq: Once | INTRAVENOUS | Status: AC
  Administered 2019-10-02: 10 mL via INTRAVENOUS
  Filled 2019-10-02: qty 10

## 2019-10-02 MED ORDER — SODIUM CHLORIDE 0.9 % IV SOLN
Freq: Once | INTRAVENOUS | Status: AC
  Administered 2019-10-02: 08:00:00 via INTRAVENOUS
  Filled 2019-10-02: qty 250

## 2019-10-02 MED ORDER — PROMETHAZINE HCL 25 MG/ML IJ SOLN
INTRAMUSCULAR | Status: AC
  Filled 2019-10-02: qty 1

## 2019-10-02 MED ORDER — PROMETHAZINE HCL 25 MG/ML IJ SOLN
25.0000 mg | Freq: Once | INTRAMUSCULAR | Status: AC
  Administered 2019-10-02: 25 mg via INTRAVENOUS

## 2019-10-02 NOTE — Patient Instructions (Signed)
Dehydration, Adult  Dehydration is a condition in which there is not enough fluid or water in the body. This happens when you lose more fluids than you take in. Important organs, such as the kidneys, brain, and heart, cannot function without a proper amount of fluids. Any loss of fluids from the body can lead to dehydration. Dehydration can range from mild to severe. This condition should be treated right away to prevent it from becoming severe. What are the causes? This condition may be caused by:  Vomiting.  Diarrhea.  Excessive sweating, such as from heat exposure or exercise.  Not drinking enough fluid, especially: ? When ill. ? While doing activity that requires a lot of energy.  Excessive urination.  Fever.  Infection.  Certain medicines, such as medicines that cause the body to lose excess fluid (diuretics).  Inability to access safe drinking water.  Reduced physical ability to get adequate water and food. What increases the risk? This condition is more likely to develop in people:  Who have a poorly controlled long-term (chronic) illness, such as diabetes, heart disease, or kidney disease.  Who are age 65 or older.  Who are disabled.  Who live in a place with high altitude.  Who play endurance sports. What are the signs or symptoms? Symptoms of mild dehydration may include:  Thirst.  Dry lips.  Slightly dry mouth.  Dry, warm skin.  Dizziness. Symptoms of moderate dehydration may include:  Very dry mouth.  Muscle cramps.  Dark urine. Urine may be the color of tea.  Decreased urine production.  Decreased tear production.  Heartbeat that is irregular or faster than normal (palpitations).  Headache.  Light-headedness, especially when you stand up from a sitting position.  Fainting (syncope). Symptoms of severe dehydration may include:  Changes in skin, such as: ? Cold and clammy skin. ? Blotchy (mottled) or pale skin. ? Skin that does  not quickly return to normal after being lightly pinched and released (poor skin turgor).  Changes in body fluids, such as: ? Extreme thirst. ? No tear production. ? Inability to sweat when body temperature is high, such as in hot weather. ? Very little urine production.  Changes in vital signs, such as: ? Weak pulse. ? Pulse that is more than 100 beats a minute when sitting still. ? Rapid breathing. ? Low blood pressure.  Other changes, such as: ? Sunken eyes. ? Cold hands and feet. ? Confusion. ? Lack of energy (lethargy). ? Difficulty waking up from sleep. ? Short-term weight loss. ? Unconsciousness. How is this diagnosed? This condition is diagnosed based on your symptoms and a physical exam. Blood and urine tests may be done to help confirm the diagnosis. How is this treated? Treatment for this condition depends on the severity. Mild or moderate dehydration can often be treated at home. Treatment should be started right away. Do not wait until dehydration becomes severe. Severe dehydration is an emergency and it needs to be treated in a hospital. Treatment for mild dehydration may include:  Drinking more fluids.  Replacing salts and minerals in your blood (electrolytes) that you may have lost. Treatment for moderate dehydration may include:  Drinking an oral rehydration solution (ORS). This is a drink that helps you replace fluids and electrolytes (rehydrate). It can be found at pharmacies and retail stores. Treatment for severe dehydration may include:  Receiving fluids through an IV tube.  Receiving an electrolyte solution through a feeding tube that is passed through your nose and   into your stomach (nasogastric tube, or NG tube).  Correcting any abnormalities in electrolytes.  Treating the underlying cause of dehydration. Follow these instructions at home:  If directed by your health care provider, drink an ORS: ? Make an ORS by following instructions on the  package. ? Start by drinking small amounts, about  cup (120 mL) every 5-10 minutes. ? Slowly increase how much you drink until you have taken the amount recommended by your health care provider.  Drink enough clear fluid to keep your urine clear or pale yellow. If you were told to drink an ORS, finish the ORS first, then start slowly drinking other clear fluids. Drink fluids such as: ? Water. Do not drink only water. Doing that can lead to having too little salt (sodium) in the body (hyponatremia). ? Ice chips. ? Fruit juice that you have added water to (diluted fruit juice). ? Low-calorie sports drinks.  Avoid: ? Alcohol. ? Drinks that contain a lot of sugar. These include high-calorie sports drinks, fruit juice that is not diluted, and soda. ? Caffeine. ? Foods that are greasy or contain a lot of fat or sugar.  Take over-the-counter and prescription medicines only as told by your health care provider.  Do not take sodium tablets. This can lead to having too much sodium in the body (hypernatremia).  Eat foods that contain a healthy balance of electrolytes, such as bananas, oranges, potatoes, tomatoes, and spinach.  Keep all follow-up visits as told by your health care provider. This is important. Contact a health care provider if:  You have abdominal pain that: ? Gets worse. ? Stays in one area (localizes).  You have a rash.  You have a stiff neck.  You are more irritable than usual.  You are sleepier or more difficult to wake up than usual.  You feel weak or dizzy.  You feel very thirsty.  You have urinated only a small amount of very dark urine over 6-8 hours. Get help right away if:  You have symptoms of severe dehydration.  You cannot drink fluids without vomiting.  Your symptoms get worse with treatment.  You have a fever.  You have a severe headache.  You have vomiting or diarrhea that: ? Gets worse. ? Does not go away.  You have blood or green matter  (bile) in your vomit.  You have blood in your stool. This may cause stool to look black and tarry.  You have not urinated in 6-8 hours.  You faint.  Your heart rate while sitting still is over 100 beats a minute.  You have trouble breathing. This information is not intended to replace advice given to you by your health care provider. Make sure you discuss any questions you have with your health care provider. Document Released: 10/09/2005 Document Revised: 09/21/2017 Document Reviewed: 12/03/2015 Elsevier Patient Education  2020 Elsevier Inc.  

## 2019-10-02 NOTE — Patient Outreach (Signed)
  [facility]) Care Management Chronic Special Needs Program  10/02/2019  Name: [name] [name] DOB: May 31, 1941  MRN: [id number]  [name] is enrolled in a chronic special needs plan for Diabetes. RNCM called to follow up and review individualized care plan. Client came to the phone and stated it was not a good time. 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 Request RNCM reschedule call.   Goals Addressed   None     Plan: RNCM will outreach to client next week.    Thea Silversmith, RN, MSN, [employer] [phone]   .

## 2019-10-03 ENCOUNTER — Encounter: Payer: Self-pay | Admitting: *Deleted

## 2019-10-03 ENCOUNTER — Inpatient Hospital Stay: Payer: HMO

## 2019-10-03 ENCOUNTER — Telehealth: Payer: Self-pay | Admitting: *Deleted

## 2019-10-03 VITALS — BP 128/61 | HR 61 | Temp 97.5°F | Resp 19

## 2019-10-03 DIAGNOSIS — Z5112 Encounter for antineoplastic immunotherapy: Secondary | ICD-10-CM | POA: Diagnosis not present

## 2019-10-03 DIAGNOSIS — C8440 Peripheral T-cell lymphoma, not classified, unspecified site: Secondary | ICD-10-CM

## 2019-10-03 MED ORDER — SODIUM CHLORIDE 0.9% FLUSH
10.0000 mL | Freq: Once | INTRAVENOUS | Status: AC
  Administered 2019-10-03: 11:00:00 10 mL via INTRAVENOUS
  Filled 2019-10-03: qty 10

## 2019-10-03 MED ORDER — SODIUM CHLORIDE 0.9 % IV SOLN
Freq: Once | INTRAVENOUS | Status: AC
  Administered 2019-10-03: 08:00:00 via INTRAVENOUS
  Filled 2019-10-03: qty 250

## 2019-10-03 MED ORDER — HEPARIN SOD (PORK) LOCK FLUSH 100 UNIT/ML IV SOLN
500.0000 [IU] | Freq: Once | INTRAVENOUS | Status: AC
  Administered 2019-10-03: 11:00:00 500 [IU] via INTRAVENOUS
  Filled 2019-10-03: qty 5

## 2019-10-03 NOTE — Progress Notes (Unsigned)
Patient in chemotherapy education class with  Daughter [name].  Discussed side effects of Adcetris, Cytoxan, Adriamycin         which include but are not limited to myelosuppression, decreased appetite, fatigue, fever, allergic or infusional reaction, mucositis, cardiac toxicity, cough, SOB, altered taste, nausea and vomiting, diarrhea, constipation, elevated LFTs myalgia and arthralgias, hair loss or thinning, rash, skin dryness, nail changes, peripheral neuropathy, discolored urine, delayed wound healing, mental changes (Chemo brain), increased risk of infections, weight loss.  Reviewed infusion room and office policy and procedure and phone numbers 24 hours x 7 days a week.  Reviewed when to call the office with any concerns or problems.  Scientist, clinical (histocompatibility and immunogenetics) given.  Discussed portacath insertion and EMLA cream administration.  Antiemetic protocol and chemotherapy schedule reviewed. Patient verbalized understanding of chemotherapy indications and possible side effects.  Teachback done

## 2019-10-03 NOTE — Telephone Encounter (Signed)
-----   Message from Tish Men, MD sent at 10/03/2019 10:02 AM EST ----- Delrae Sawyers,  Can you let the patient know that he may take Imodium as needed for diarrhea? Abdominal X-ray did not show any severe constipation.Thanks  [facility] ----- Message ----- From: Interface, Rad Results In Sent: 10/02/2019   4:17 PM EST To: Tish Men, MD

## 2019-10-03 NOTE — Patient Instructions (Signed)
Dehydration, Adult  Dehydration is when there is not enough fluid or water in your body. This happens when you lose more fluids than you take in. Dehydration can range from mild to very bad. It should be treated right away to keep it from getting very bad. Symptoms of mild dehydration may include:  Thirst.  Dry lips.  Slightly dry mouth.  Dry, warm skin.  Dizziness. Symptoms of moderate dehydration may include:  Very dry mouth.  Muscle cramps.  Dark pee (urine). Pee may be the color of tea.  Your body making less pee.  Your eyes making fewer tears.  Heartbeat that is uneven or faster than normal (palpitations).  Headache.  Light-headedness, especially when you stand up from sitting.  Fainting (syncope). Symptoms of very bad dehydration may include:  Changes in skin, such as: ? Cold and clammy skin. ? Blotchy (mottled) or pale skin. ? Skin that does not quickly return to normal after being lightly pinched and let go (poor skin turgor).  Changes in body fluids, such as: ? Feeling very thirsty. ? Your eyes making fewer tears. ? Not sweating when body temperature is high, such as in hot weather. ? Your body making very little pee.  Changes in vital signs, such as: ? Weak pulse. ? Pulse that is more than 100 beats a minute when you are sitting still. ? Fast breathing. ? Low blood pressure.  Other changes, such as: ? Sunken eyes. ? Cold hands and feet. ? Confusion. ? Lack of energy (lethargy). ? Trouble waking up from sleep. ? Short-term weight loss. ? Unconsciousness. Follow these instructions at home:   If told by your doctor, drink an ORS: ? Make an ORS by using instructions on the package. ? Start by drinking small amounts, about  cup (120 mL) every 5-10 minutes. ? Slowly drink more until you have had the amount that your doctor said to have.  Drink enough clear fluid to keep your pee clear or pale yellow. If you were told to drink an ORS, finish the  ORS first, then start slowly drinking clear fluids. Drink fluids such as: ? Water. Do not drink only water by itself. Doing that can make the salt (sodium) level in your body get too low (hyponatremia). ? Ice chips. ? Fruit juice that you have added water to (diluted). ? Low-calorie sports drinks.  Avoid: ? Alcohol. ? Drinks that have a lot of sugar. These include high-calorie sports drinks, fruit juice that does not have water added, and soda. ? Caffeine. ? Foods that are greasy or have a lot of fat or sugar.  Take over-the-counter and prescription medicines only as told by your doctor.  Do not take salt tablets. Doing that can make the salt level in your body get too high (hypernatremia).  Eat foods that have minerals (electrolytes). Examples include bananas, oranges, potatoes, tomatoes, and spinach.  Keep all follow-up visits as told by your doctor. This is important. Contact a doctor if:  You have belly (abdominal) pain that: ? Gets worse. ? Stays in one area (localizes).  You have a rash.  You have a stiff neck.  You get angry or annoyed more easily than normal (irritability).  You are more sleepy than normal.  You have a harder time waking up than normal.  You feel: ? Weak. ? Dizzy. ? Very thirsty.  You have peed (urinated) only a small amount of very dark pee during 6-8 hours. Get help right away if:  You have   symptoms of very bad dehydration.  You cannot drink fluids without throwing up (vomiting).  Your symptoms get worse with treatment.  You have a fever.  You have a very bad headache.  You are throwing up or having watery poop (diarrhea) and it: ? Gets worse. ? Does not go away.  You have blood or something green (bile) in your throw-up.  You have blood in your poop (stool). This may cause poop to look black and tarry.  You have not peed in 6-8 hours.  You pass out (faint).  Your heart rate when you are sitting still is more than 100 beats a  minute.  You have trouble breathing. This information is not intended to replace advice given to you by your health care provider. Make sure you discuss any questions you have with your health care provider. Document Released: 08/05/2009 Document Revised: 09/21/2017 Document Reviewed: 12/03/2015 Elsevier Patient Education  2020 Elsevier Inc.  

## 2019-10-03 NOTE — Telephone Encounter (Signed)
As noted below by Dr. Maylon Peppers, I informed the patient that the abdominal X-ray didn't show any severe constipation. Also, he may take Imodium as needed for diarrhea. He verbalized understanding.

## 2019-10-06 ENCOUNTER — Encounter: Payer: Self-pay | Admitting: Hematology

## 2019-10-06 ENCOUNTER — Inpatient Hospital Stay (HOSPITAL_BASED_OUTPATIENT_CLINIC_OR_DEPARTMENT_OTHER): Payer: HMO | Admitting: Hematology

## 2019-10-06 ENCOUNTER — Inpatient Hospital Stay: Payer: HMO

## 2019-10-06 ENCOUNTER — Other Ambulatory Visit: Payer: Self-pay

## 2019-10-06 VITALS — BP 135/72 | HR 68 | Temp 97.6°F | Resp 17 | Ht 72.0 in

## 2019-10-06 DIAGNOSIS — D701 Agranulocytosis secondary to cancer chemotherapy: Secondary | ICD-10-CM

## 2019-10-06 DIAGNOSIS — D6481 Anemia due to antineoplastic chemotherapy: Secondary | ICD-10-CM

## 2019-10-06 DIAGNOSIS — T451X5A Adverse effect of antineoplastic and immunosuppressive drugs, initial encounter: Secondary | ICD-10-CM | POA: Diagnosis not present

## 2019-10-06 DIAGNOSIS — C8440 Peripheral T-cell lymphoma, not classified, unspecified site: Secondary | ICD-10-CM

## 2019-10-06 DIAGNOSIS — D6959 Other secondary thrombocytopenia: Secondary | ICD-10-CM | POA: Diagnosis not present

## 2019-10-06 DIAGNOSIS — R300 Dysuria: Secondary | ICD-10-CM

## 2019-10-06 DIAGNOSIS — N179 Acute kidney failure, unspecified: Secondary | ICD-10-CM | POA: Diagnosis not present

## 2019-10-06 DIAGNOSIS — Z95828 Presence of other vascular implants and grafts: Secondary | ICD-10-CM

## 2019-10-06 LAB — CBC WITH DIFFERENTIAL (CANCER CENTER ONLY)
Abs Immature Granulocytes: 0.22 10*3/uL — ABNORMAL HIGH (ref 0.00–0.07)
Basophils Absolute: 0.1 10*3/uL (ref 0.0–0.1)
Basophils Relative: 2 %
Eosinophils Absolute: 0.3 10*3/uL (ref 0.0–0.5)
Eosinophils Relative: 11 %
HCT: 28.2 % — ABNORMAL LOW (ref 39.0–52.0)
Hemoglobin: 9.5 g/dL — ABNORMAL LOW (ref 13.0–17.0)
Immature Granulocytes: 9 %
Lymphocytes Relative: 13 %
Lymphs Abs: 0.3 10*3/uL — ABNORMAL LOW (ref 0.7–4.0)
MCH: 28.2 pg (ref 26.0–34.0)
MCHC: 33.7 g/dL (ref 30.0–36.0)
MCV: 83.7 fL (ref 80.0–100.0)
Monocytes Absolute: 0.1 10*3/uL (ref 0.1–1.0)
Monocytes Relative: 2 %
Neutro Abs: 1.6 10*3/uL — ABNORMAL LOW (ref 1.7–7.7)
Neutrophils Relative %: 63 %
Platelet Count: 89 10*3/uL — ABNORMAL LOW (ref 150–400)
RBC: 3.37 MIL/uL — ABNORMAL LOW (ref 4.22–5.81)
RDW: 14.8 % (ref 11.5–15.5)
WBC Count: 2.5 10*3/uL — ABNORMAL LOW (ref 4.0–10.5)
nRBC: 0 % (ref 0.0–0.2)

## 2019-10-06 LAB — CMP (CANCER CENTER ONLY)
ALT: 9 U/L (ref 0–44)
AST: 11 U/L — ABNORMAL LOW (ref 15–41)
Albumin: 3.5 g/dL (ref 3.5–5.0)
Alkaline Phosphatase: 88 U/L (ref 38–126)
Anion gap: 9 (ref 5–15)
BUN: 28 mg/dL — ABNORMAL HIGH (ref 8–23)
CO2: 26 mmol/L (ref 22–32)
Calcium: 8.6 mg/dL — ABNORMAL LOW (ref 8.9–10.3)
Chloride: 103 mmol/L (ref 98–111)
Creatinine: 0.83 mg/dL (ref 0.61–1.24)
GFR, Est AFR Am: >60 mL/min (ref >60)
GFR, Estimated: >60 mL/min (ref >60)
Glucose, Bld: 116 mg/dL — ABNORMAL HIGH (ref 70–99)
Potassium: 4 mmol/L (ref 3.5–5.1)
Sodium: 138 mmol/L (ref 135–145)
Total Bilirubin: 0.7 mg/dL (ref 0.3–1.2)
Total Protein: 6.1 g/dL — ABNORMAL LOW (ref 6.5–8.1)

## 2019-10-06 LAB — URINALYSIS, COMPLETE (UACMP) WITH MICROSCOPIC
Bilirubin Urine: NEGATIVE
Glucose, UA: NEGATIVE mg/dL
Hgb urine dipstick: NEGATIVE
Ketones, ur: NEGATIVE mg/dL
Leukocytes,Ua: NEGATIVE
Nitrite: NEGATIVE
Protein, ur: 30 mg/dL — AB
Specific Gravity, Urine: 1.02 (ref 1.005–1.030)
pH: 6 (ref 5.0–8.0)

## 2019-10-06 MED ORDER — PROMETHAZINE HCL 25 MG/ML IJ SOLN
25.0000 mg | Freq: Once | INTRAMUSCULAR | Status: AC
  Administered 2019-10-06: 10:00:00 25 mg via INTRAVENOUS

## 2019-10-06 MED ORDER — HEPARIN SOD (PORK) LOCK FLUSH 100 UNIT/ML IV SOLN
500.0000 [IU] | Freq: Once | INTRAVENOUS | Status: AC
  Administered 2019-10-06: 12:00:00 500 [IU] via INTRAVENOUS
  Filled 2019-10-06: qty 5

## 2019-10-06 MED ORDER — PROMETHAZINE HCL 25 MG/ML IJ SOLN
INTRAMUSCULAR | Status: AC
  Filled 2019-10-06: qty 1

## 2019-10-06 MED ORDER — SODIUM CHLORIDE 0.9% FLUSH
10.0000 mL | Freq: Once | INTRAVENOUS | Status: AC
  Administered 2019-10-06: 10 mL via INTRAVENOUS
  Filled 2019-10-06: qty 10

## 2019-10-06 MED ORDER — SODIUM CHLORIDE 0.9 % IV SOLN
Freq: Once | INTRAVENOUS | Status: AC
  Administered 2019-10-06: 10:00:00 via INTRAVENOUS
  Filled 2019-10-06: qty 250

## 2019-10-06 NOTE — Patient Instructions (Signed)

## 2019-10-06 NOTE — Progress Notes (Signed)
[place] OFFICE PROGRESS NOTE  Patient Care Team: Colon Branch, MD as PCP - General [name], [name], MD as Consulting Physician (Cardiology) [name], MD as Consulting Physician (Gastroenterology) [name], MD as Consulting Physician (Orthopedic Surgery) [name], MD as Consulting Physician (Physical Medicine and Rehabilitation) Luretha Rued, RN as [facility] [name], MD as Consulting Physician (Otolaryngology) Tish Men, MD as Medical Oncologist (Oncology) Cordelia Poche, RN as Oncology Nurse Navigator  HEME/ONC OVERVIEW: 1. Stage IV angioimmunoblastic T-cell lymphoma -07/2019: L neck LN FNA non-diagnostic  -08/2019:   Multiple enlarged cervical, mediastinal, bulky retroperitoneal LN's (~5cm), and irregular RLL nodule (1.6x1.1cm) on CT   Excisional left cervical LN bx, path showed angioimmunoblastic T-cell lymphoma, CD30+ (at least 25%)  FDG-avid LN's in the neck, chest and abdomen/pelvis; no bone involvement  Small lymphoid aggregates on the bone marrow biopsy, suspicious for lymphoma involvement  -09/2019 - present: Adcetris + CHP w/ G-CSF support  TREATMENT SUMMARY:  09/30/2019 - present: Adcetris + CHP w/ G-CSF support  ASSESSMENT & PLAN:   Stage IV angioimmunoblastic T-cell lymphoma -S/p 1 cycle of CHP + Adcetris -Patient tolerated treatment relatively well -See the management of treatment-related toxicities below -Plan for 6 cycles (up to 8 cycles in the trial) -PRN anti-emetics: Zofran, Compazine, and Ativan   Chemotherapy-associated anemia -Secondary to chemotherapy, as well as anemia of chronic disease and hemodilution -Hgb 9.5, lower than the last visit  -Patient denies any symptom of bleeding -We will monitor for now; no indication for dose adjustment  Chemotherapy-associated leukopenia -Secondary to chemotherapy -WBC 2.5k with ANC 1600, lower than the last visit -Patient denies  any symptoms of infection -We will monitor for now; no indication for dose adjustment  Chemotherapy-associated thrombocytopenia -Secondary to chemotherapy -Plts 89k, lower than the last visit  -Patient denies any symptoms of bleeding or excess bruising, such as epistaxis, hematochezia, melena, or hematuria -We will monitor for now; no indication for dose adjustment  AKI -Secondary to dehydration and hypotension  -Patient has been receiving daily 1L NS since last week  -Cr 0.83 today, significantly improving (down from 3.8) -Anti-hypertensive medications on hold -I counseled the patient on the importance of maintaining adequate nutrition and hydration -Patient also met with nutrition regarding nutritional recommendations  -Proceed with 1L NS today -As he has not been drinking adequate fluids, I will schedule 1L for 12/21 and 12/22; if he is able to increase fluid intake, then  -We will monitor it closely   Dysuria -New over the weekend -Dysuria has improved; patient denies any infection  -I have ordered urinalysis and urine culture  -Patient is instructed to contact the clinic if he develops any symptoms of infection   Orders Placed This Encounter  Procedures  . Culture, Urine    Standing Status:   Future    Standing Expiration Date:   10/05/2020  . Urinalysis, Complete w Microscopic    Standing Status:   Future    Standing Expiration Date:   10/05/2020   All questions were answered. The patient knows to call the clinic with any problems, questions or concerns. No barriers to learning was detected.  Return in 2 weeks for Cycle 2 of chemotherapy.   Tish Men, MD 10/06/2019 10:19 AM  CHIEF COMPLAINT: "I am doing much better"  INTERVAL HISTORY: [name] [name] returns to clinic for follow-up of Stage IV angioimmunoblastic T-cell lymphoma on chemotherapy.  Patient reports that he feels much better with IV fluids  over the past week, and has more energy.  The right cervical lymph  nodes appeared to be somewhat smaller in size.  Starting over the weekend, he had 1 day of increased frequency of urination, associated with a slight dysuria, but the dysuria sensation resolved without any intervention.  He denies any associated fever, back pain, or hematuria.  The diarrhea has resolved, and he now has just mild loose stool.  He denies any significant abdominal pain.  He drinks one Ensure per day, but his oral intake is otherwise limited due to poor appetite.  He denies any other complaint today.  REVIEW OF SYSTEMS:   Constitutional: ( - ) fevers, ( - )  chills , ( - ) night sweats Eyes: ( - ) blurriness of vision, ( - ) double vision, ( - ) watery eyes Ears, nose, mouth, throat, and face: ( - ) mucositis, ( - ) sore throat Respiratory: ( - ) cough, ( - ) dyspnea, ( - ) wheezes Cardiovascular: ( - ) palpitation, ( - ) chest discomfort, ( - ) lower extremity swelling Gastrointestinal:  ( - ) nausea, ( - ) heartburn, ( + ) change in bowel habits Skin: ( - ) abnormal skin rashes Lymphatics: ( + ) lymphadenopathy, ( - ) easy bruising Neurological: ( - ) numbness, ( - ) tingling, ( - ) new weaknesses Behavioral/Psych: ( - ) mood change, ( - ) new changes  All other systems were reviewed with the patient and are negative.  SUMMARY OF ONCOLOGIC HISTORY: Oncology History  Angioimmunoblastic lymphoma ([place])  08/25/2019 Imaging   CT neck: IMPRESSION: Right greater than left cervical and supraclavicular adenopathy with some nodes demonstrating potential cystic change or necrosis. This may be infectious/inflammatory or neoplastic and tissue sampling is recommended.   08/25/2019 Imaging   CT chest: IMPRESSION: Multiple areas of adenopathy involving the lower neck, left axilla, subcarinal station, and largest areas of nodal masses throughout the visualized retroperitoneum. Splenomegaly.   Scattered inflammatory stranding adjacent to the distal pancreatic body-tail. 11 cm lobulated  cyst in the left kidney.   1.6 and 1.1 cm irregular nodules in the medial basilar segment of the right lower lobe.   09/01/2019 Pathology Results   FINAL MICROSCOPIC DIAGNOSIS:   A. LYMPH NODE, RIGHT SUPRACLAVICULAR, EXCISION:  - Angioimmunoblastic T-cell lymphoma.   COMMENT:   Sections reveal soft tissue with mixed inflammation and foreign body  giant cell reaction with focally entrapped abnormal lymph nodes. The  abnormal lymph nodes are effaced with numerous medium sized atypical  lymphocytes with clear cytoplasm. There are admixed eosinophils, plasma  cells, and smaller lymphocytes. There is increased vascularity. These  atypical cells extend into the surrounding tissue with the mixed soft  tissue reaction. The abnormal lymphocytes are positive for CD2, CD3,  CD4, PD1, CXCL13, bcl-6, bcl-2, and CD10. CD21 and CD23 highlight  disrupted follicular dendritic networks. There is weak CD30 expression  (25%). They are negative for CD7, CD8, CD15, and CD56. CD138 highlights  plasma cells that are polytypic by light chain in situ hybridization.  EBV in situ hybridization is negative in the B-cell and T-cells. CD20  highlights residual cortical B-cells. GMS, AFB, and PAS are negative for  organisms. Flow cytometry ([id number]) reveals abundant T-cells with  decreased/absent CD7. PCR for T-cell receptor gamma gene rearrangement  is positive. Overall, these findings are consistent with involvement by  angioimmunoblastic T-cell lymphoma. A preliminary diagnosis was called  to Dr. Erik Obey on [id number]/80/1655. Dr. Maylon Peppers was paged on  09/19/19 for the  final diagnosis.    09/08/2019 Initial Diagnosis   T-cell lymphoma ([place])   09/17/2019 Imaging   Pet: IMPRESSION: 1. Active lymphoma within the neck, chest, abdomen, and pelvis, as detailed above. ([place]) 4 2. Development of small bilateral pleural effusions and abdominal ascites, possibly related to fluid overload. 3. Coronary artery  atherosclerosis. Aortic Atherosclerosis (ICD10-I70.0).   09/30/2019 -  Chemotherapy   The patient had DOXOrubicin (ADRIAMYCIN) chemo injection 130 mg, 50 mg/m2 = 130 mg, Intravenous,  Once, 1 of 6 cycles Administration: 130 mg (09/30/2019) palonosetron (ALOXI) injection 0.25 mg, 0.25 mg, Intravenous,  Once, 1 of 6 cycles Administration: 0.25 mg (09/30/2019) pegfilgrastim (NEULASTA ONPRO KIT) injection 6 mg, 6 mg, Subcutaneous, Once, 1 of 6 cycles cyclophosphamide (CYTOXAN) 1,940 mg in sodium chloride 0.9 % 250 mL chemo infusion, 750 mg/m2 = 1,940 mg, Intravenous,  Once, 1 of 6 cycles Administration: 1,940 mg (09/30/2019) brentuximab vedotin (ADCETRIS) 135 mg in sodium chloride 0.9 % 100 mL chemo infusion, 135 mg (75 % of original dose 180 mg), Intravenous,  Once, 1 of 6 cycles Dose modification: 180 mg (original dose 180 mg, Cycle 1, Reason: Other (see comments), Comment: Maximum dose Adcetris ), 135 mg (original dose 180 mg, Cycle 1, Reason: Change in SCr/CrCl) Administration: 135 mg (09/30/2019) fosaprepitant (EMEND) 150 mg, dexamethasone (DECADRON) 12 mg in sodium chloride 0.9 % 145 mL IVPB, , Intravenous,  Once, 1 of 6 cycles Administration:  (09/30/2019)  for chemotherapy treatment.      I have reviewed the past medical history, past surgical history, social history and family history with the patient and they are unchanged from previous note.  ALLERGIES:  is allergic to doxycycline.  MEDICATIONS:  Current Outpatient Medications  Medication Sig Dispense Refill  . allopurinol (ZYLOPRIM) 300 MG tablet Take 1 tablet (300 mg total) by mouth daily. 30 tablet 3  . aspirin 81 MG tablet Take 81 mg by mouth daily.    . carvedilol (COREG) 12.5 MG tablet Take 1 tablet (12.5 mg total) by mouth 2 (two) times daily with a meal. 60 tablet 6  . chlorthalidone (HYGROTON) 25 MG tablet Take 0.5 tablets (12.5 mg total) by mouth daily. 15 tablet 5  . hydrochlorothiazide (HYDRODIURIL) 25 MG tablet Take 25 mg  by mouth daily.    Marland Kitchen HYDROcodone-acetaminophen (NORCO) 5-325 MG tablet Take 1 tablet by mouth every 8 (eight) hours as needed for moderate pain. 40 tablet 0  . ketoconazole (NIZORAL) 2 % cream Apply 1 application topically daily. (Patient taking differently: Apply 1 application topically daily as needed for irritation. ) 30 g 0  . LORazepam (ATIVAN) 0.5 MG tablet Take 1 tablet (0.5 mg total) by mouth every 6 (six) hours as needed (Nausea or vomiting). 30 tablet 0  . losartan (COZAAR) 100 MG tablet Take 1 tablet (100 mg total) by mouth daily. 90 tablet 1  . metFORMIN (GLUCOPHAGE) 500 MG tablet Take 1 tablet (500 mg total) by mouth 2 (two) times daily with a meal. (Patient taking differently: Take 500 mg by mouth daily with breakfast. ) 180 tablet 1  . Nutritional Supplements (GLUCERNA 1.0 CAL/CARBSTEADY) LIQD Take 1 Can by mouth daily. Okay to drink up to 3 cans/bottles daily to replace meals as needed. 21330 mL 6  . Omega-3 Fatty Acids (FISH OIL) 500 MG CAPS Take 1 capsule by mouth daily.    Marland Kitchen omeprazole (PRILOSEC) 20 MG capsule Take 1 capsule (20 mg total) by mouth daily. (Patient taking differently: Take 20 mg  by mouth 2 (two) times daily before a meal. ) 90 capsule 3  . ondansetron (ZOFRAN) 8 MG tablet Take 1 tablet (8 mg total) by mouth 2 (two) times daily as needed for refractory nausea / vomiting. Start on day 3 after cyclophosphamide chemotherapy. 30 tablet 1  . potassium chloride (KLOR-CON M10) 10 MEQ tablet Take 1 tablet (10 mEq total) by mouth daily. 90 tablet 1  . prochlorperazine (COMPAZINE) 10 MG tablet Take 1 tablet (10 mg total) by mouth every 6 (six) hours as needed (Nausea or vomiting). 30 tablet 6  . simvastatin (ZOCOR) 80 MG tablet Take 0.5 tablets (40 mg total) by mouth daily. 45 tablet 1  . traMADol (ULTRAM) 50 MG tablet Take 1 tablet (50 mg total) by mouth every 6 (six) hours as needed. 40 tablet 1   No current facility-administered medications for this visit.    PHYSICAL  EXAMINATION: ECOG PERFORMANCE STATUS: 2 - Symptomatic, <50% confined to bed  Today's Vitals   10/06/19 0925  BP: 135/72  Pulse: 68  Resp: 17  Temp: 97.6 F (36.4 C)  TempSrc: Temporal  SpO2: 95%  Height: 6' (1.829 m)  PainSc: 0-No pain   Body mass index is 41.3 kg/m.  Filed Weights    GENERAL: alert, no distress and comfortable sitting in a wheelchair, very obese  SKIN: skin color, texture, turgor are normal, no rashes or significant lesions EYES: conjunctiva are pink and non-injected, sclera clear OROPHARYNX: no exudate, no erythema; lips, buccal mucosa, and tongue normal  NECK: supple, non-tender LYMPH:  Improving R supraclavicular adenopathy, measuring ~3cm, scattered small left cervical adenopathy  LUNGS: clear to auscultation with normal breathing effort HEART: regular rate & rhythm and no murmurs and no lower extremity edema ABDOMEN: soft, non-tender, moderately distended (improving), normal bowel sounds Musculoskeletal: no cyanosis of digits and no clubbing  PSYCH: alert & oriented x 3, slowed speech  LABORATORY DATA:  I have reviewed the data as listed    Component Value Date/Time   NA 138 10/06/2019 0855   K 4.0 10/06/2019 0855   CL 103 10/06/2019 0855   CO2 26 10/06/2019 0855   GLUCOSE 116 (H) 10/06/2019 0855   GLUCOSE 101 (H) 08/30/2006 1117   BUN 28 (H) 10/06/2019 0855   CREATININE 0.83 10/06/2019 0855   CREATININE 1.41 (H) 06/06/2019 1528   CALCIUM 8.6 (L) 10/06/2019 0855   PROT 6.1 (L) 10/06/2019 0855   ALBUMIN 3.5 10/06/2019 0855   AST 11 (L) 10/06/2019 0855   ALT 9 10/06/2019 0855   ALKPHOS 88 10/06/2019 0855   BILITOT 0.7 10/06/2019 0855   GFRNONAA >60 10/06/2019 0855   GFRAA >60 10/06/2019 0855    No results found for: SPEP, UPEP  Lab Results  Component Value Date   WBC 2.5 (L) 10/06/2019   NEUTROABS 1.6 (L) 10/06/2019   HGB 9.5 (L) 10/06/2019   HCT 28.2 (L) 10/06/2019   MCV 83.7 10/06/2019   PLT 89 (L) 10/06/2019      Chemistry       Component Value Date/Time   NA 138 10/06/2019 0855   K 4.0 10/06/2019 0855   CL 103 10/06/2019 0855   CO2 26 10/06/2019 0855   BUN 28 (H) 10/06/2019 0855   CREATININE 0.83 10/06/2019 0855   CREATININE 1.41 (H) 06/06/2019 1528      Component Value Date/Time   CALCIUM 8.6 (L) 10/06/2019 0855   ALKPHOS 88 10/06/2019 0855   AST 11 (L) 10/06/2019 0855   ALT 9 10/06/2019  0855   BILITOT 0.7 10/06/2019 0855       RADIOGRAPHIC STUDIES: I have personally reviewed the radiological images as listed below and agreed with the findings in the report. DG Chest 2 View  Result Date: 09/23/2019 CLINICAL DATA:  Productive cough and dyspnea for 4 weeks.  Lymphoma. EXAM: CHEST - 2 VIEW COMPARISON:  06/16/2019 chest radiograph. FINDINGS: Right internal jugular Port-A-Cath terminates in the middle third of the SVC. Stable cardiomediastinal silhouette with normal heart size. No pneumothorax. Small bilateral pleural effusions. No pulmonary edema. Mild bibasilar atelectasis. IMPRESSION: Mild bibasilar atelectasis with small bilateral pleural effusions. Electronically Signed   By: Ilona Sorrel M.D.   On: 09/23/2019 13:45   PET, initial (skull base to thigh)  Result Date: 09/17/2019 CLINICAL DATA:  Initial treatment strategy for T-cell lymphoma/leukemia. EXAM: NUCLEAR MEDICINE PET SKULL BASE TO THIGH TECHNIQUE: 14.6 mCi F-18 FDG was injected intravenously. Full-ring PET imaging was performed from the skull base to thigh after the radiotracer. CT data was obtained and used for attenuation correction and anatomic localization. Fasting blood glucose: 147 mg/dl COMPARISON:  Chest and neck CTs of 08/25/2019. FINDINGS: Mediastinal blood pool activity: SUV max 3.1 Liver activity: SUV max 3.8 NECK: Bilateral hypermetabolic cervical adenopathy. An index right level 2 nodal conglomerate measures 5.4 x 3.0 cm and a S.U.V. max of 18.6 on 29/4. Incidental CT findings: Deferred to recent diagnostic CT. CHEST: Extensive  hypermetabolic thoracic adenopathy. Subcarinal node measures 2.3 cm and a S.U.V. max of 19.2 on 78/4 Left axillary node measures 3.2 cm and a S.U.V. max of 18.1 on 69/4. Incidental CT findings: Deferred to recent diagnostic CT. New small bilateral pleural effusions. Right Port-A-Cath tip at low SVC. Aortic and coronary artery atherosclerosis. ABDOMEN/PELVIS: Extensive abdominal hypermetabolic adenopathy. An index left periaortic node measures 3.6 cm and a S.U.V. max of 20.3 on 129/4. Portal caval node measures 3.7 cm and a S.U.V. max of 22.9 on 120/4. An index right external iliac node measures 1.3 cm and a S.U.V. max of 19.2 on 171/4. Index left inguinal node measures 1.5 cm and a S.U.V. max of 15.3 on 192/4. No splenic hypermetabolism. Incidental CT findings: Small volume abdominopelvic ascites, new since the prior diagnostic CT. Interpolar left renal 9.4 cm cyst. Abdominal aortic atherosclerosis. SKELETON: No abnormal marrow activity. 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 IMPRESSION: 1. Active lymphoma within the neck, chest, abdomen, and pelvis, as detailed above. ([place]) 4 2. Development of small bilateral pleural effusions and abdominal ascites, possibly related to fluid overload. 3. Coronary artery atherosclerosis. Aortic Atherosclerosis (ICD10-I70.0). Electronically Signed   By: Abigail Miyamoto M.D.   On: 09/17/2019 10:23   CT-gudied biopsy  Result Date: 09/19/2019 INDICATION: T-cell lymphoma. Please perform CT-guided bone marrow biopsy for tissue diagnostic purposes. EXAM: CT-GUIDED BONE MARROW BIOPSY AND ASPIRATION MEDICATIONS: None ANESTHESIA/SEDATION: Fentanyl 100 mcg IV; Versed 2 mg IV Sedation Time: 10 Minutes; The patient was continuously monitored during the procedure by the interventional radiology nurse under my direct supervision. COMPLICATIONS: None immediate. PROCEDURE: Informed consent  was obtained from the patient following an explanation of the procedure, risks, benefits and alternatives. The patient understands, agrees and consents for the procedure. All questions were addressed. A time out was performed prior to the initiation of the procedure. The patient was positioned prone and non-contrast localization CT was performed of the pelvis to demonstrate the iliac marrow spaces. The operative site was prepped and draped in the usual  sterile fashion. Under sterile conditions and local anesthesia, a 22 gauge spinal needle was utilized for procedural planning. Next, an 11 gauge coaxial bone biopsy needle was advanced into the left iliac marrow space. Needle position was confirmed with CT imaging. Initially, bone marrow aspiration was performed. Next, a bone marrow biopsy was obtained with the 11 gauge outer bone marrow device. Samples were prepared with the cytotechnologist and deemed adequate. The needle was removed intact. Hemostasis was obtained with compression and a dressing was placed. The patient tolerated the procedure well without immediate post procedural complication. IMPRESSION: Successful CT guided left iliac bone marrow aspiration and core biopsy. Electronically Signed   By: Sandi Mariscal M.D.   On: 09/19/2019 10:39   Xray, abd (2 view)  Result Date: 10/02/2019 CLINICAL DATA:  Nausea, abdominal distension EXAM: ABDOMEN - 2 VIEW COMPARISON:  None. FINDINGS: Examination is limited secondary to poor penetration from patient body habitus. The bowel gas pattern is normal. There is no evidence of free air. No radio-opaque calculi or other significant radiographic abnormality is seen. IMPRESSION: Nonobstructive bowel gas pattern. Electronically Signed   By: Davina Poke M.D.   On: 10/02/2019 16:14   CT BONE MARROW BIOPSY & ASPIRATION  Result Date: 09/19/2019 INDICATION: T-cell lymphoma. Please perform CT-guided bone marrow biopsy for tissue diagnostic purposes. EXAM: CT-GUIDED  BONE MARROW BIOPSY AND ASPIRATION MEDICATIONS: None ANESTHESIA/SEDATION: Fentanyl 100 mcg IV; Versed 2 mg IV Sedation Time: 10 Minutes; The patient was continuously monitored during the procedure by the interventional radiology nurse under my direct supervision. COMPLICATIONS: None immediate. PROCEDURE: Informed consent was obtained from the patient following an explanation of the procedure, risks, benefits and alternatives. The patient understands, agrees and consents for the procedure. All questions were addressed. A time out was performed prior to the initiation of the procedure. The patient was positioned prone and non-contrast localization CT was performed of the pelvis to demonstrate the iliac marrow spaces. The operative site was prepped and draped in the usual sterile fashion. Under sterile conditions and local anesthesia, a 22 gauge spinal needle was utilized for procedural planning. Next, an 11 gauge coaxial bone biopsy needle was advanced into the left iliac marrow space. Needle position was confirmed with CT imaging. Initially, bone marrow aspiration was performed. Next, a bone marrow biopsy was obtained with the 11 gauge outer bone marrow device. Samples were prepared with the cytotechnologist and deemed adequate. The needle was removed intact. Hemostasis was obtained with compression and a dressing was placed. The patient tolerated the procedure well without immediate post procedural complication. IMPRESSION: Successful CT guided left iliac bone marrow aspiration and core biopsy. Electronically Signed   By: Sandi Mariscal M.D.   On: 09/19/2019 10:39   ECHOCARDIOGRAM COMPLETE  Result Date: 09/12/2019   ECHOCARDIOGRAM REPORT   Patient Name:   [name] [name] Date of Exam: 09/12/2019 Medical Rec #:  [id number]     Height:       72.0 in Accession #:    [id number]    Weight:       290.7 lb Date of Birth:  04/06/1941     BSA:          2.50 m Patient Age:    9 years      BP:           131/62 mmHg Patient  Gender: M             HR:           81  bpm. Exam Location:  [facility] Procedure: 2D Echo, Cardiac Doppler, Color Doppler, Strain Analysis and            Intracardiac Opacification Agent Indications:    Chemo  History:        Patient has prior history of Echocardiogram examinations, most                 recent 05/29/2013. Arrythmias:PVC; Risk Factors:Hypertension,                 Diabetes and Dyslipidemia.  Sonographer:    Cardell Peach RDCS (AE) Referring Phys: [id number] [facility]  Sonographer Comments: Technically challenging study due to limited acoustic windows. IMPRESSIONS  1. Left ventricular ejection fraction, by visual estimation, is 60 to 65%. The left ventricle has normal function. There is moderately increased left ventricular hypertrophy.  2. Definity contrast agent was given IV to delineate the left ventricular endocardial borders.  3. Left ventricular diastolic parameters are consistent with Grade I diastolic dysfunction (impaired relaxation).  4. The tricuspid valve is normal in structure. Tricuspid valve regurgitation is trivial.  5. The aortic valve is normal in structure. Aortic valve regurgitation is trivial. No evidence of aortic valve sclerosis or stenosis.  6. There is mild dilatation of the ascending aorta measuring 39 mm. FINDINGS  Left Ventricle: Left ventricular ejection fraction, by visual estimation, is 60 to 65%. The left ventricle has normal function. Definity contrast agent was given IV to delineate the left ventricular endocardial borders. There is moderately increased left ventricular hypertrophy. Left ventricular diastolic parameters are consistent with Grade I diastolic dysfunction (impaired relaxation). Normal left atrial pressure. Right Ventricle: The right ventricular size is normal. No increase in right ventricular wall thickness. Global RV systolic function is has normal systolic function. The tricuspid regurgitant velocity is 2.65 m/s, and with an assumed right atrial pressure   of 3 mmHg, the estimated right ventricular systolic pressure is mildly elevated at 31.1 mmHg. Left Atrium: Left atrial size was normal in size. Right Atrium: Right atrial size was normal in size Pericardium: There is no evidence of pericardial effusion. Mitral Valve: The mitral valve is normal in structure. No evidence of mitral valve stenosis by observation. No evidence of mitral valve regurgitation. Tricuspid Valve: The tricuspid valve is normal in structure. Tricuspid valve regurgitation is trivial. Aortic Valve: The aortic valve is normal in structure. Aortic valve regurgitation is trivial. Aortic regurgitation PHT measures 425 msec. The aortic valve is structurally normal, with no evidence of sclerosis or stenosis. Pulmonic Valve: The pulmonic valve was normal in structure. Pulmonic valve regurgitation is not visualized. Aorta: The aortic root, ascending aorta and aortic arch are all structurally normal, with no evidence of dilitation or obstruction. There is mild dilatation of the ascending aorta measuring 39 mm. Venous: The inferior vena cava is normal in size with greater than 50% respiratory variability, suggesting right atrial pressure of 3 mmHg. IAS/Shunts: No atrial level shunt detected by color flow Doppler. No ventricular septal defect is seen or detected. There is no evidence of an atrial septal defect.  LEFT VENTRICLE PLAX 2D LVIDd:         5.06 cm  Diastology LVIDs:         2.90 cm  LV e' lateral:   7.72 cm/s LV PW:         1.38 cm  LV E/e' lateral: 7.0 LV IVS:        1.51 cm  LV e' medial:    9.79  cm/s LVOT diam:     2.00 cm  LV E/e' medial:  5.6 LV SV:         89 ml LV SV Index:   33.82 LVOT Area:     3.14 cm  RIGHT VENTRICLE             IVC RV Basal diam:  2.28 cm     IVC diam: 1.57 cm RV S prime:     19.50 cm/s TAPSE (M-mode): 1.9 cm LEFT ATRIUM             Index       RIGHT ATRIUM           Index LA diam:        4.00 cm 1.60 cm/m  RA Area:     14.00 cm LA Vol (A2C):   40.9 ml 16.37 ml/m RA  Volume:   29.60 ml  11.85 ml/m LA Vol (A4C):   49.3 ml 19.74 ml/m LA Biplane Vol: 46.3 ml 18.54 ml/m  AORTIC VALVE LVOT Vmax:   103.00 cm/s LVOT Vmean:  81.800 cm/s LVOT VTI:    0.191 m AI PHT:      425 msec  AORTA Ao Root diam: 3.80 cm Ao Asc diam:  3.90 cm MITRAL VALVE                        TRICUSPID VALVE MV Area (PHT): 2.59 cm             TR Peak grad:   28.1 mmHg MV PHT:        84.97 msec           TR Vmax:        265.00 cm/s MV Decel Time: 293 msec MV E velocity: 54.40 cm/s 103 cm/s  SHUNTS MV A velocity: 81.90 cm/s 70.3 cm/s Systemic VTI:  0.19 m MV E/A ratio:  0.66       1.5       Systemic Diam: 2.00 cm  Jyl Heinz MD Electronically signed by Jyl Heinz MD Signature Date/Time: 09/12/2019/6:00:10 PM    Final    IR port placement [facility])  Result Date: 09/16/2019 CLINICAL DATA:  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 EXAM: IMPLANTED PORT A CATH PLACEMENT WITH ULTRASOUND AND FLUOROSCOPIC GUIDANCE ANESTHESIA/SEDATION: 3.0 mg IV Versed; 100 mcg IV Fentanyl Total Moderate Sedation Time:  30 minutes The patient's level of consciousness and physiologic status were continuously monitored during the procedure by Radiology nursing. Additional Medications: 2 g IV Ancef. FLUOROSCOPY TIME:  30 seconds.  25.8 mGy. PROCEDURE: The procedure, risks, benefits, and alternatives were explained to the patient. Questions regarding the procedure were encouraged and answered. The patient understands and consents to the procedure. A time-out was performed prior to initiating the procedure. Ultrasound was utilized to confirm patency of the right internal jugular vein. The right neck and chest were prepped with chlorhexidine in a sterile fashion, and a sterile drape was applied covering the operative field. Maximum barrier sterile technique with sterile gowns and gloves were used for the procedure. Local anesthesia was provided with 1% lidocaine. After creating a small venotomy incision, a 21  gauge needle was advanced into the right internal jugular vein under direct, real-time ultrasound guidance. Ultrasound image documentation was performed. After securing guidewire access, an 8 Fr dilator was placed. A J-wire was kinked to measure appropriate catheter length. A subcutaneous port pocket was then created along the upper  chest wall utilizing sharp and blunt dissection. Portable cautery was utilized. The pocket was irrigated with sterile saline. A single lumen power injectable port was chosen for placement. The 8 Fr catheter was tunneled from the port pocket site to the venotomy incision. The port was placed in the pocket. External catheter was trimmed to appropriate length based on guidewire measurement. At the venotomy, an 8 Fr peel-away sheath was placed over a guidewire. The catheter was then placed through the sheath and the sheath removed. Final catheter positioning was confirmed and documented with a fluoroscopic spot image. The port was accessed with a needle and aspirated and flushed with heparinized saline. The access needle was removed. The venotomy and port pocket incisions were closed with subcutaneous 3-0 Monocryl and subcuticular 4-0 Vicryl. Dermabond was applied to both incisions. COMPLICATIONS: COMPLICATIONS None FINDINGS: After catheter placement, the tip lies at the cavo-atrial junction. The catheter aspirates normally and is ready for immediate use. IMPRESSION: Placement of single lumen port a cath via right internal jugular vein. The catheter tip lies at the cavo-atrial junction. A power injectable port a cath was placed and is ready for immediate use. Electronically Signed   By: Aletta Edouard M.D.   On: 09/16/2019 15:51

## 2019-10-06 NOTE — Patient Instructions (Signed)
Dehydration, Adult  Dehydration is a condition in which there is not enough fluid or water in the body. This happens when you lose more fluids than you take in. Important organs, such as the kidneys, brain, and heart, cannot function without a proper amount of fluids. Any loss of fluids from the body can lead to dehydration. Dehydration can range from mild to severe. This condition should be treated right away to prevent it from becoming severe. What are the causes? This condition may be caused by:  Vomiting.  Diarrhea.  Excessive sweating, such as from heat exposure or exercise.  Not drinking enough fluid, especially: ? When ill. ? While doing activity that requires a lot of energy.  Excessive urination.  Fever.  Infection.  Certain medicines, such as medicines that cause the body to lose excess fluid (diuretics).  Inability to access safe drinking water.  Reduced physical ability to get adequate water and food. What increases the risk? This condition is more likely to develop in people:  Who have a poorly controlled long-term (chronic) illness, such as diabetes, heart disease, or kidney disease.  Who are age 65 or older.  Who are disabled.  Who live in a place with high altitude.  Who play endurance sports. What are the signs or symptoms? Symptoms of mild dehydration may include:  Thirst.  Dry lips.  Slightly dry mouth.  Dry, warm skin.  Dizziness. Symptoms of moderate dehydration may include:  Very dry mouth.  Muscle cramps.  Dark urine. Urine may be the color of tea.  Decreased urine production.  Decreased tear production.  Heartbeat that is irregular or faster than normal (palpitations).  Headache.  Light-headedness, especially when you stand up from a sitting position.  Fainting (syncope). Symptoms of severe dehydration may include:  Changes in skin, such as: ? Cold and clammy skin. ? Blotchy (mottled) or pale skin. ? Skin that does  not quickly return to normal after being lightly pinched and released (poor skin turgor).  Changes in body fluids, such as: ? Extreme thirst. ? No tear production. ? Inability to sweat when body temperature is high, such as in hot weather. ? Very little urine production.  Changes in vital signs, such as: ? Weak pulse. ? Pulse that is more than 100 beats a minute when sitting still. ? Rapid breathing. ? Low blood pressure.  Other changes, such as: ? Sunken eyes. ? Cold hands and feet. ? Confusion. ? Lack of energy (lethargy). ? Difficulty waking up from sleep. ? Short-term weight loss. ? Unconsciousness. How is this diagnosed? This condition is diagnosed based on your symptoms and a physical exam. Blood and urine tests may be done to help confirm the diagnosis. How is this treated? Treatment for this condition depends on the severity. Mild or moderate dehydration can often be treated at home. Treatment should be started right away. Do not wait until dehydration becomes severe. Severe dehydration is an emergency and it needs to be treated in a hospital. Treatment for mild dehydration may include:  Drinking more fluids.  Replacing salts and minerals in your blood (electrolytes) that you may have lost. Treatment for moderate dehydration may include:  Drinking an oral rehydration solution (ORS). This is a drink that helps you replace fluids and electrolytes (rehydrate). It can be found at pharmacies and retail stores. Treatment for severe dehydration may include:  Receiving fluids through an IV tube.  Receiving an electrolyte solution through a feeding tube that is passed through your nose and   into your stomach (nasogastric tube, or NG tube).  Correcting any abnormalities in electrolytes.  Treating the underlying cause of dehydration. Follow these instructions at home:  If directed by your health care provider, drink an ORS: ? Make an ORS by following instructions on the  package. ? Start by drinking small amounts, about  cup (120 mL) every 5-10 minutes. ? Slowly increase how much you drink until you have taken the amount recommended by your health care provider.  Drink enough clear fluid to keep your urine clear or pale yellow. If you were told to drink an ORS, finish the ORS first, then start slowly drinking other clear fluids. Drink fluids such as: ? Water. Do not drink only water. Doing that can lead to having too little salt (sodium) in the body (hyponatremia). ? Ice chips. ? Fruit juice that you have added water to (diluted fruit juice). ? Low-calorie sports drinks.  Avoid: ? Alcohol. ? Drinks that contain a lot of sugar. These include high-calorie sports drinks, fruit juice that is not diluted, and soda. ? Caffeine. ? Foods that are greasy or contain a lot of fat or sugar.  Take over-the-counter and prescription medicines only as told by your health care provider.  Do not take sodium tablets. This can lead to having too much sodium in the body (hypernatremia).  Eat foods that contain a healthy balance of electrolytes, such as bananas, oranges, potatoes, tomatoes, and spinach.  Keep all follow-up visits as told by your health care provider. This is important. Contact a health care provider if:  You have abdominal pain that: ? Gets worse. ? Stays in one area (localizes).  You have a rash.  You have a stiff neck.  You are more irritable than usual.  You are sleepier or more difficult to wake up than usual.  You feel weak or dizzy.  You feel very thirsty.  You have urinated only a small amount of very dark urine over 6-8 hours. Get help right away if:  You have symptoms of severe dehydration.  You cannot drink fluids without vomiting.  Your symptoms get worse with treatment.  You have a fever.  You have a severe headache.  You have vomiting or diarrhea that: ? Gets worse. ? Does not go away.  You have blood or green matter  (bile) in your vomit.  You have blood in your stool. This may cause stool to look black and tarry.  You have not urinated in 6-8 hours.  You faint.  Your heart rate while sitting still is over 100 beats a minute.  You have trouble breathing. This information is not intended to replace advice given to you by your health care provider. Make sure you discuss any questions you have with your health care provider. Document Released: 10/09/2005 Document Revised: 09/21/2017 Document Reviewed: 12/03/2015 Elsevier Patient Education  2020 Elsevier Inc.  

## 2019-10-07 ENCOUNTER — Telehealth: Payer: Self-pay | Admitting: Internal Medicine

## 2019-10-07 LAB — URINE CULTURE: Culture: <10000 — AB

## 2019-10-07 NOTE — Telephone Encounter (Signed)
Reportedly is only taking 1 tablet at breakfast, that is okay, his last A1c was 6.1

## 2019-10-07 NOTE — Telephone Encounter (Signed)
Please advise on metformin dose.

## 2019-10-08 ENCOUNTER — Emergency Department (HOSPITAL_COMMUNITY): Payer: HMO

## 2019-10-08 ENCOUNTER — Other Ambulatory Visit: Payer: Self-pay | Admitting: Hematology

## 2019-10-08 ENCOUNTER — Other Ambulatory Visit: Payer: Self-pay

## 2019-10-08 ENCOUNTER — Other Ambulatory Visit: Payer: Self-pay | Admitting: *Deleted

## 2019-10-08 ENCOUNTER — Inpatient Hospital Stay (HOSPITAL_COMMUNITY)
Admission: EM | Admit: 2019-10-08 | Discharge: 2019-10-20 | DRG: 291 | Disposition: A | Discharge status: Home Health | Payer: HMO | Attending: Internal Medicine | Admitting: Internal Medicine

## 2019-10-08 ENCOUNTER — Telehealth: Payer: Self-pay | Admitting: Internal Medicine

## 2019-10-08 DIAGNOSIS — Z7982 Long term (current) use of aspirin: Secondary | ICD-10-CM

## 2019-10-08 DIAGNOSIS — E119 Type 2 diabetes mellitus without complications: Secondary | ICD-10-CM

## 2019-10-08 DIAGNOSIS — R7989 Other specified abnormal findings of blood chemistry: Secondary | ICD-10-CM

## 2019-10-08 DIAGNOSIS — E785 Hyperlipidemia, unspecified: Secondary | ICD-10-CM | POA: Diagnosis present

## 2019-10-08 DIAGNOSIS — R778 Other specified abnormalities of plasma proteins: Secondary | ICD-10-CM

## 2019-10-08 DIAGNOSIS — Z66 Do not resuscitate: Secondary | ICD-10-CM | POA: Diagnosis present

## 2019-10-08 DIAGNOSIS — A419 Sepsis, unspecified organism: Secondary | ICD-10-CM | POA: Diagnosis not present

## 2019-10-08 DIAGNOSIS — E669 Obesity, unspecified: Secondary | ICD-10-CM | POA: Diagnosis present

## 2019-10-08 DIAGNOSIS — Z6837 Body mass index (BMI) 37.0-37.9, adult: Secondary | ICD-10-CM

## 2019-10-08 DIAGNOSIS — C8440 Peripheral T-cell lymphoma, not classified, unspecified site: Secondary | ICD-10-CM

## 2019-10-08 DIAGNOSIS — Z800 Family history of malignant neoplasm of digestive organs: Secondary | ICD-10-CM

## 2019-10-08 DIAGNOSIS — R0602 Shortness of breath: Secondary | ICD-10-CM

## 2019-10-08 DIAGNOSIS — R0689 Other abnormalities of breathing: Secondary | ICD-10-CM | POA: Diagnosis not present

## 2019-10-08 DIAGNOSIS — T451X5A Adverse effect of antineoplastic and immunosuppressive drugs, initial encounter: Secondary | ICD-10-CM | POA: Diagnosis present

## 2019-10-08 DIAGNOSIS — Z20828 Contact with and (suspected) exposure to other viral communicable diseases: Secondary | ICD-10-CM | POA: Diagnosis present

## 2019-10-08 DIAGNOSIS — I10 Essential (primary) hypertension: Secondary | ICD-10-CM | POA: Diagnosis present

## 2019-10-08 DIAGNOSIS — Y95 Nosocomial condition: Secondary | ICD-10-CM | POA: Diagnosis not present

## 2019-10-08 DIAGNOSIS — C865 Angioimmunoblastic T-cell lymphoma: Secondary | ICD-10-CM | POA: Diagnosis present

## 2019-10-08 DIAGNOSIS — I152 Hypertension secondary to endocrine disorders: Secondary | ICD-10-CM | POA: Diagnosis present

## 2019-10-08 DIAGNOSIS — R05 Cough: Secondary | ICD-10-CM | POA: Diagnosis not present

## 2019-10-08 DIAGNOSIS — I77810 Thoracic aortic ectasia: Secondary | ICD-10-CM | POA: Diagnosis present

## 2019-10-08 DIAGNOSIS — Z96611 Presence of right artificial shoulder joint: Secondary | ICD-10-CM | POA: Diagnosis present

## 2019-10-08 DIAGNOSIS — R197 Diarrhea, unspecified: Secondary | ICD-10-CM | POA: Diagnosis not present

## 2019-10-08 DIAGNOSIS — I959 Hypotension, unspecified: Secondary | ICD-10-CM | POA: Diagnosis not present

## 2019-10-08 DIAGNOSIS — K529 Noninfective gastroenteritis and colitis, unspecified: Secondary | ICD-10-CM | POA: Diagnosis present

## 2019-10-08 DIAGNOSIS — E1169 Type 2 diabetes mellitus with other specified complication: Secondary | ICD-10-CM | POA: Diagnosis present

## 2019-10-08 DIAGNOSIS — I110 Hypertensive heart disease with heart failure: Principal | ICD-10-CM | POA: Diagnosis present

## 2019-10-08 DIAGNOSIS — T501X5A Adverse effect of loop [high-ceiling] diuretics, initial encounter: Secondary | ICD-10-CM | POA: Diagnosis not present

## 2019-10-08 DIAGNOSIS — Z96651 Presence of right artificial knee joint: Secondary | ICD-10-CM | POA: Diagnosis present

## 2019-10-08 DIAGNOSIS — Z87891 Personal history of nicotine dependence: Secondary | ICD-10-CM

## 2019-10-08 DIAGNOSIS — R55 Syncope and collapse: Secondary | ICD-10-CM | POA: Diagnosis not present

## 2019-10-08 DIAGNOSIS — N179 Acute kidney failure, unspecified: Secondary | ICD-10-CM | POA: Diagnosis present

## 2019-10-08 DIAGNOSIS — G8929 Other chronic pain: Secondary | ICD-10-CM | POA: Diagnosis present

## 2019-10-08 DIAGNOSIS — D61810 Antineoplastic chemotherapy induced pancytopenia: Secondary | ICD-10-CM

## 2019-10-08 DIAGNOSIS — I5021 Acute systolic (congestive) heart failure: Secondary | ICD-10-CM | POA: Diagnosis present

## 2019-10-08 DIAGNOSIS — E876 Hypokalemia: Secondary | ICD-10-CM | POA: Diagnosis present

## 2019-10-08 DIAGNOSIS — R0902 Hypoxemia: Secondary | ICD-10-CM | POA: Diagnosis not present

## 2019-10-08 DIAGNOSIS — E1159 Type 2 diabetes mellitus with other circulatory complications: Secondary | ICD-10-CM | POA: Diagnosis present

## 2019-10-08 DIAGNOSIS — Z881 Allergy status to other antibiotic agents status: Secondary | ICD-10-CM

## 2019-10-08 DIAGNOSIS — Z8249 Family history of ischemic heart disease and other diseases of the circulatory system: Secondary | ICD-10-CM

## 2019-10-08 DIAGNOSIS — Z7984 Long term (current) use of oral hypoglycemic drugs: Secondary | ICD-10-CM

## 2019-10-08 DIAGNOSIS — J189 Pneumonia, unspecified organism: Secondary | ICD-10-CM | POA: Diagnosis not present

## 2019-10-08 DIAGNOSIS — E860 Dehydration: Secondary | ICD-10-CM | POA: Diagnosis not present

## 2019-10-08 DIAGNOSIS — K219 Gastro-esophageal reflux disease without esophagitis: Secondary | ICD-10-CM | POA: Diagnosis present

## 2019-10-08 DIAGNOSIS — M1990 Unspecified osteoarthritis, unspecified site: Secondary | ICD-10-CM | POA: Diagnosis present

## 2019-10-08 DIAGNOSIS — Z833 Family history of diabetes mellitus: Secondary | ICD-10-CM

## 2019-10-08 DIAGNOSIS — I440 Atrioventricular block, first degree: Secondary | ICD-10-CM | POA: Diagnosis present

## 2019-10-08 DIAGNOSIS — I509 Heart failure, unspecified: Secondary | ICD-10-CM

## 2019-10-08 DIAGNOSIS — J9601 Acute respiratory failure with hypoxia: Secondary | ICD-10-CM | POA: Diagnosis present

## 2019-10-08 DIAGNOSIS — D849 Immunodeficiency, unspecified: Secondary | ICD-10-CM | POA: Diagnosis present

## 2019-10-08 LAB — CBC WITH DIFFERENTIAL/PLATELET
Abs Immature Granulocytes: 0 10*3/uL (ref 0.00–0.07)
Basophils Absolute: 0 10*3/uL (ref 0.0–0.1)
Basophils Relative: 3 %
Eosinophils Absolute: 0 10*3/uL (ref 0.0–0.5)
Eosinophils Relative: 14 %
HCT: 27.4 % — ABNORMAL LOW (ref 39.0–52.0)
Hemoglobin: 8.9 g/dL — ABNORMAL LOW (ref 13.0–17.0)
Immature Granulocytes: 0 %
Lymphocytes Relative: 24 %
Lymphs Abs: 0.1 10*3/uL — ABNORMAL LOW (ref 0.7–4.0)
MCH: 27.7 pg (ref 26.0–34.0)
MCHC: 32.5 g/dL (ref 30.0–36.0)
MCV: 85.4 fL (ref 80.0–100.0)
Monocytes Absolute: 0.1 10*3/uL (ref 0.1–1.0)
Monocytes Relative: 28 %
Neutro Abs: 0.1 10*3/uL — ABNORMAL LOW (ref 1.7–7.7)
Neutrophils Relative %: 31 %
Platelets: 83 10*3/uL — ABNORMAL LOW (ref 150–400)
RBC: 3.21 MIL/uL — ABNORMAL LOW (ref 4.22–5.81)
RDW: 14.5 % (ref 11.5–15.5)
WBC: 0.3 10*3/uL — CL (ref 4.0–10.5)
nRBC: 0 % (ref 0.0–0.2)

## 2019-10-08 LAB — COMPREHENSIVE METABOLIC PANEL
ALT: 14 U/L (ref 0–44)
AST: 13 U/L — ABNORMAL LOW (ref 15–41)
Albumin: 2.8 g/dL — ABNORMAL LOW (ref 3.5–5.0)
Alkaline Phosphatase: 69 U/L (ref 38–126)
Anion gap: 8 (ref 5–15)
BUN: 14 mg/dL (ref 8–23)
CO2: 24 mmol/L (ref 22–32)
Calcium: 7.8 mg/dL — ABNORMAL LOW (ref 8.9–10.3)
Chloride: 104 mmol/L (ref 98–111)
Creatinine, Ser: 1.09 mg/dL (ref 0.61–1.24)
GFR calc Af Amer: >60 mL/min (ref >60)
GFR calc non Af Amer: >60 mL/min (ref >60)
Glucose, Bld: 138 mg/dL — ABNORMAL HIGH (ref 70–99)
Potassium: 3.4 mmol/L — ABNORMAL LOW (ref 3.5–5.1)
Sodium: 136 mmol/L (ref 135–145)
Total Bilirubin: 1.4 mg/dL — ABNORMAL HIGH (ref 0.3–1.2)
Total Protein: 6.2 g/dL — ABNORMAL LOW (ref 6.5–8.1)

## 2019-10-08 LAB — BRAIN NATRIURETIC PEPTIDE: B Natriuretic Peptide: 1106.6 pg/mL — ABNORMAL HIGH (ref 0.0–100.0)

## 2019-10-08 LAB — POC SARS CORONAVIRUS 2 AG -  ED: SARS Coronavirus 2 Ag: NEGATIVE

## 2019-10-08 LAB — TROPONIN I (HIGH SENSITIVITY): Troponin I (High Sensitivity): 869 ng/L (ref <18)

## 2019-10-08 MED ORDER — LORAZEPAM 0.5 MG PO TABS: 0.5000 mg | ORAL_TABLET | Freq: Four times a day (QID) | ORAL | 0 refills | Status: DC | PRN

## 2019-10-08 MED ORDER — HYDROCODONE-ACETAMINOPHEN 7.5-325 MG PO TABS: 1.0000 | ORAL_TABLET | Freq: Three times a day (TID) | ORAL | 0 refills | Status: DC | PRN

## 2019-10-08 MED ORDER — SODIUM CHLORIDE 0.9 % IV BOLUS
1000.0000 mL | Freq: Once | INTRAVENOUS | Status: AC
  Administered 2019-10-08: 1000 mL via INTRAVENOUS

## 2019-10-08 NOTE — ED Triage Notes (Signed)
Per EMS: Pt is coming from home with c/o progressive weakness and diarrhea. Pt is reporting having a syncopal episode and was able to crawl to the restroom. He is currently unable to stand without assistance. Pt is on a liquid diet with a poor oral intake. Pt has hx of cancer, last chemo on Monday. Pt reports no SOB but has increased respirations.  EMS VITALS: BP 107/65 HR 100 RR 30 SPO2 94% 3L and 88% RA CBG 178  750 of fluid

## 2019-10-08 NOTE — Patient Outreach (Signed)
  [facility]) Care Management Chronic Special Needs Program  10/08/2019  Name: [name] [name] DOB: 1941/05/23  MRN: [id number]  [name] is enrolled in a chronic special needs plan for Diabetes. Reviewed and updated care plan.  Subjective: Client reports he attend his provider visits and that his family takes him to his appointments. Last A1C 6.1 on 09/23/2019. He denies any difficulty with management diabetes at this time. Client denies difficulty obtaining medications. Client reports he is receiving chemotherapy. He also follows up with a nutritionist regarding his nutritional needs and is drinking Glucerna supplement.  Goals Addressed            This Visit's Progress   . COMPLETED: Client understands the importance of follow-up with providers by attending scheduled visits       Voiced understanding of importance of attending provider visits.    . COMPLETED: Client will use Assistive Devices as needed and verbalize understanding of device use       Voicing no issues with use of assistive device at this time.    . COMPLETED: Client will verbalize knowledge of self management of Hypertension as evidences by BP reading of 140/90 or less; or as defined by provider       Reports takes medications as scheduled, monitors blood pressure, follows up with provider. Reports blood pressure less than 140/90.    Marland Kitchen COMPLETED: HEMOGLOBIN A1C < 7       A1C 6.1 on 09/23/2019    . COMPLETED: Maintain timely refills of diabetic medication as prescribed within the year .       Reports no diffficulty obtaining medications.    . Obtain annual  Lipid Profile, LDL-C   On track   . Obtain Annual Eye (retinal)  Exam    Not on track   . COMPLETED: Obtain Annual Foot Exam       done    . Obtain annual screen for micro albuminuria (urine) , nephropathy (kidney problems)   On track   . COMPLETED: Obtain Hemoglobin A1C at least 2 times per year       Done 04/22/2019 and 12/10-2018    .  COMPLETED: Visit Primary Care Provider or Endocrinologist at least 2 times per year        Has seen provider at least twice this year.       Plan: RNCM will send updated care plan to client; send updated care plan to primary care provider. RNCM will follow up per next tier level within the next 9-12 months.     Thea Silversmith, RN, MSN, [place] [phone]    .

## 2019-10-08 NOTE — ED Notes (Signed)
Patient aware urine sample is needed. Urinal at bedside.

## 2019-10-08 NOTE — ED Provider Notes (Signed)
Medical screening examination/treatment/procedure(s) were conducted as a shared visit with non-physician practitioner(s) and myself.  I personally evaluated the patient during the encounter.  History of lymphoma on chemotherapy presents the emerge department for progressively worsening weakness.  Also shortness of breath the last couple days. On exam patient is tachypneic, tachycardic with a mildly irregular rhythm, hypoxic on room air requiring 3 L of oxygen to maintain saturations above 90%.  His lungs are overall clear however secondary to body habitus may not hear fine lung sounds.  He has bilateral lower extremity pitting edema to the knees seems to be symmetric.  No obvious JVD or S3.  Normal gross neurologic function without obvious focal deficits. Suspect patient is here with some mild pulmonary edema.  Could also be acute renal failure versus less likely pulmonary embolus. No significant infectious symptoms. Will await labs and treat appropriately.  Hold on CT scan for now if consistent with heart failure. Discussed goals of care with the patient and his discussed mechanical ventilation, intravenous nutrition, and other life support measures and patient specifically stated that he would not want to be kept alive if machines were required.  Patient's CODE STATUS changed to DNR. Will plan for admission.   EKG Interpretation  Date/Time:  Wednesday October 08 2019 21:51:18 EST Ventricular Rate:  99 PR Interval:    QRS Duration: 91 QT Interval:  352 QTC Calculation: 452 R Axis:   26 Text Interpretation: Sinus tachycardia Atrial premature complex Prolonged PR interval Anterior infarct, old Baseline wander in lead(s) V3 nonspecific st t changes since last tracing Confirmed by Dorie Rank [phone]) on 10/08/2019 10:58:49 PM     Valerie Cones, Corene Cornea, MD 10/09/19 [phone]

## 2019-10-08 NOTE — ED Notes (Signed)
Patients SPO2 dropped to 86% RA. Patient stated he was feeling SOB and lightheaded without the supplemental oxygen. This RN placed the patient back on 3L of oxygen. SPO2 is now 95%. Will continue to monitor.

## 2019-10-08 NOTE — Telephone Encounter (Signed)
Spoke with [place], the patient continue with pain at the neck, back, chest, abdomen. Hydrocodone 5 mg helps some. Tramadol did not help. Plan: Discontinue tramadol, increase hydrocodone to 7.5 mg 3 times daily, consider increase to 10 mg.  Also, she thinks he is about to "give up", I encouraged her to think about palliative care if he is not ready for hospice.

## 2019-10-08 NOTE — Telephone Encounter (Signed)
Pt's daughter called in to ask if provider would refill/ prescribe pt's hydrocodone? Advised that Dr. Tish Men, MD prescribed medication, daughter says that provider stated that he doesn't like to prescribe medication.   Please assist.     Pharmacy:  [id number] - [place], [place] [address]. Phone:  [phone]  Fax:  [phone]       Meriel FlavorsRise Paganini [phone]

## 2019-10-08 NOTE — ED Provider Notes (Addendum)
[facility] DEPT Provider Note   CSN: [id number] Arrival date & time: 10/08/19  2135     History Chief Complaint  Patient presents with  . Weakness    [name] [name] is a 78 y.o. male.  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  He reports that he was trying to reach the bathroom tonight, when he passed out.  He reports that he has had severe nausea, vomiting, and diarrhea over the past several days.  Last chemotherapy was Friday of last week.  He also reports that he has had some cough and shortness of breath.  He denies any known fevers.  He does not wear oxygen at home, but is noted to be wearing oxygen in the ED.  He has chronic pain, but denies any new pain.  He states that he mostly feels fatigued.  The history is provided by the patient. No language interpreter was used.       Past Medical History:  Diagnosis Date  . Arthritis   . Complication of anesthesia     had spinal with knee surgeries  -"heart rate too low" for general  . Detached retina, left    L eye normal vision, reports that he has had a retina procedure in a doctor's office at [facility]   . Diabetes mellitus without complication ([place])   . Dysrhythmia    slight irregular rate  . GERD (gastroesophageal reflux disease)    hx no problems now  . H/O echocardiogram 2014  . History of hiatal hernia    ?20 yrs ago  . Hyperopia 2016  . Hypertension   . Lumbar stenosis with neurogenic claudication   . 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 of L eye,   . Prediabetes   . Presbyopia OU 2016    Patient Active Problem List   Diagnosis Date Noted  . Anemia due to antineoplastic chemotherapy 10/06/2019  . Leukopenia due to antineoplastic chemotherapy ([place]) 10/06/2019  . Chemotherapy-induced thrombocytopenia 10/06/2019  . Angioimmunoblastic  lymphoma ([place]) 09/08/2019  . Stasis dermatitis of both legs 06/21/2019  . Diabetes mellitus, type II ([place]) 07/09/2017  . Status post lumbar surgery 06/06/2017  . 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 PVC's (premature ventricular contractions) 11/24/2015  . PCP NOTES >>>>>>>>>>>>>>>>>>>>>> 06/23/2015  . Morbid obesity ([place]) 02/24/2015  . Bradycardia 11/19/2013  . 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 Palpitations 04/19/2013  . Annual physical exam 12/07/2011  . R calf larger than L, chronic, U/S (-) for DVT 11-2011 12/07/2011  . Osteoarthritis 01/30/2008  . Hyperlipidemia 12/19/2007  . HTN (hypertension) 12/19/2007  . ERECTILE DYSFUNCTION 01/28/2007  . GERD 01/28/2007    Past Surgical History:  Procedure Laterality Date  . COLONOSCOPY    . HERNIA REPAIR     bilateral inguinal hernia  . HERNIA REPAIR     umbilical  . IR IMAGING GUIDED PORT INSERTION  09/16/2019  . JOINT REPLACEMENT  2009   right knee  . LUMBAR LAMINECTOMY/DECOMPRESSION MICRODISCECTOMY N/A 06/06/2017   Procedure: Decompression L3-5, insitu fusion L3-5 ;  Surgeon: Melina Schools, MD;  Location: [facility];  Service: Orthopedics;  Laterality: N/A;  . LYMPH NODE BIOPSY Right 09/01/2019   Procedure: CERVICAL LYMPH NODE BIOPSY;  Surgeon: [name], MD;  Location: [facility];  Service: ENT;  Laterality: Right;  . MIDDLE EAR SURGERY Right    ear -  patched hole in ear drum  . TOTAL KNEE ARTHROPLASTY  09/10/2012   Procedure: TOTAL KNEE ARTHROPLASTY;  Surgeon: Tobi Bastos, MD;  Location: [facility];  Service: Orthopedics;  Laterality: Left;  . TOTAL SHOULDER ARTHROPLASTY Right 06/22/2016   Procedure: RIGHT TOTAL SHOULDER ARTHROPLASTY;  Surgeon: [name], MD;  Location: [facility];  Service: Orthopedics;  Laterality: Right;       Family History  Problem Relation Age of Onset  . Diabetes Brother   . Colon cancer Brother 57       dx in 2018  . Hypertension Sister   . Prostate cancer Neg Hx   . Stroke Neg Hx   . CAD Neg Hx      Social History   Tobacco Use  . Smoking status: Former Smoker    Packs/day: 0.50    Years: 2.00    Pack years: 1.00    Types: Cigarettes    Quit date: 06/09/1966    Years since quitting: 53.3  . Smokeless tobacco: Never Used  Substance Use Topics  . Alcohol use: No    Alcohol/week: 0.0 standard drinks    Comment: none  . Drug use: No    Home Medications Prior to Admission medications   Medication Sig Start Date End Date Taking? Authorizing Provider  allopurinol (ZYLOPRIM) 300 MG tablet Take 1 tablet (300 mg total) by mouth daily. 09/30/19   Tish Men, MD  aspirin 81 MG tablet Take 81 mg by mouth daily.    [provider]  carvedilol (COREG) 12.5 MG tablet Take 1 tablet (12.5 mg total) by mouth 2 (two) times daily with a meal. 06/20/19   Colon Branch, MD  chlorthalidone (HYGROTON) 25 MG tablet Take 0.5 tablets (12.5 mg total) by mouth daily. 07/16/19   Colon Branch, MD  hydrochlorothiazide (HYDRODIURIL) 25 MG tablet Take 25 mg by mouth daily. 08/14/19   [provider]  HYDROcodone-acetaminophen (NORCO) 7.5-325 MG tablet Take 1 tablet by mouth 3 (three) times daily as needed for moderate pain. 10/08/19   Colon Branch, MD  ketoconazole (NIZORAL) 2 % cream Apply 1 application topically daily. Patient taking differently: Apply 1 application topically daily as needed for irritation.  05/28/19   Colon Branch, MD  LORazepam (ATIVAN) 0.5 MG tablet Take 1 tablet (0.5 mg total) by mouth every 6 (six) hours as needed (Nausea or vomiting). 10/08/19   Tish Men, MD  losartan (COZAAR) 100 MG tablet Take 1 tablet (100 mg total) by mouth daily. 05/15/19   Colon Branch, MD  metFORMIN (GLUCOPHAGE) 500 MG tablet Take 1 tablet (500 mg total) by mouth daily with breakfast. 10/07/19   Colon Branch, MD  Nutritional Supplements (GLUCERNA 1.0 CAL/CARBSTEADY) LIQD Take 1 Can by mouth daily. Okay to drink up to 3 cans/bottles daily to replace meals as needed. 09/23/19   Colon Branch, MD  Omega-3 Fatty  Acids (FISH OIL) 500 MG CAPS Take 1 capsule by mouth daily.    [provider]  omeprazole (PRILOSEC) 20 MG capsule Take 1 capsule (20 mg total) by mouth daily. Patient taking differently: Take 20 mg by mouth 2 (two) times daily before a meal.  10/02/16   Colon Branch, MD  ondansetron (ZOFRAN) 8 MG tablet Take 1 tablet (8 mg total) by mouth 2 (two) times daily as needed for refractory nausea / vomiting. Start on day 3 after cyclophosphamide chemotherapy. 09/30/19   Tish Men, MD  potassium chloride (KLOR-CON M10) 10 MEQ  tablet Take 1 tablet (10 mEq total) by mouth daily. 05/15/19   Colon Branch, MD  prochlorperazine (COMPAZINE) 10 MG tablet Take 1 tablet (10 mg total) by mouth every 6 (six) hours as needed (Nausea or vomiting). 09/30/19   Tish Men, MD  simvastatin (ZOCOR) 80 MG tablet Take 0.5 tablets (40 mg total) by mouth daily. 10/07/18   Colon Branch, MD    Allergies    Doxycycline  Review of Systems   Review of Systems  All other systems reviewed and are negative.   Physical Exam Updated Vital Signs BP 121/60 (BP Location: Right Arm)   Pulse 96   Temp 98.7 F (37.1 C) (Oral)   Resp (!) 22   SpO2 96%   Physical Exam Vitals and nursing note reviewed.  Constitutional:      Appearance: He is well-developed.  HENT:     Head: Normocephalic and atraumatic.  Eyes:     Conjunctiva/sclera: Conjunctivae normal.  Cardiovascular:     Rate and Rhythm: Normal rate and regular rhythm.     Heart sounds: No murmur.  Pulmonary:     Effort: Pulmonary effort is normal. No respiratory distress.     Breath sounds: Normal breath sounds.     Comments: Tachypneic Abdominal:     Palpations: Abdomen is soft.     Tenderness: There is no abdominal tenderness.  Musculoskeletal:        General: Normal range of motion.     Cervical back: Neck supple.  Skin:    General: Skin is warm and dry.  Neurological:     Mental Status: He is alert and oriented to person, place, and time.  Psychiatric:         Mood and Affect: Mood normal.        Behavior: Behavior normal.     ED Results / Procedures / Treatments   Labs (all labs ordered are listed, but only abnormal results are displayed) Labs Reviewed - No data to display  EKG EKG Interpretation  Date/Time:  Wednesday October 08 2019 21:51:18 EST Ventricular Rate:  99 PR Interval:    QRS Duration: 91 QT Interval:  352 QTC Calculation: 452 R Axis:   26 Text Interpretation: Sinus tachycardia Atrial premature complex Prolonged PR interval Anterior infarct, old Baseline wander in lead(s) V3 nonspecific st t changes since last tracing Confirmed by Dorie Rank [phone]) on 10/08/2019 10:58:49 PM   Radiology DG Chest Port 1 View  Result Date: 10/08/2019 CLINICAL DATA:  Cough, shortness of breath, weakness EXAM: PORTABLE CHEST 1 VIEW COMPARISON:  Radiograph 09/23/2019 FINDINGS: Globally increased opacity in the right hemithorax with indistinctness of the right lung base likely reflecting some layering pleural fluid on a background of more diffuse interstitial airspace opacities. Layering left pleural effusion is present as well. A right IJ approach Port-A-Cath tip terminates in the superior cavoatrial junction. Cardiomegaly is similar to priors. IMPRESSION: Patchy interstitial and airspace opacity throughout both lungs with layering bilateral effusions. Appearance favoring CHF/volume overload though infection could have a similar appearance. Electronically Signed   By: Lovena Le M.D.   On: 10/08/2019 22:45    Procedures .Critical Care Performed by: Montine Circle, PA-C Authorized by: Montine Circle, PA-C   Critical care provider statement:    Critical care time (minutes):  45   Critical care was necessary to treat or prevent imminent or life-threatening deterioration of the following conditions:  Respiratory failure and cardiac failure   Critical care was time spent personally by  me on the following activities:  Discussions  with consultants, evaluation of patient's response to treatment, examination of patient, ordering and performing treatments and interventions, ordering and review of laboratory studies, ordering and review of radiographic studies, pulse oximetry, re-evaluation of patient's condition, obtaining history from patient or surrogate and review of old charts   (including critical care time)  Medications Ordered in ED Medications  sodium chloride 0.9 % bolus 1,000 mL (has no administration in time range)    ED Course  I have reviewed the triage vital signs and the nursing notes.  Pertinent labs & imaging results that were available during my care of the patient were reviewed by me and considered in my medical decision making (see chart for details).    MDM Rules/Calculators/A&P                       Patient with lymphoma, has worsening weakness and SOB.  Noted to by tachypneic.  Hypoxic on RA.  Not on O2 at home.  Requiring 3.5L [place] here to maintain O2 sat >90%.  Afebrile.  Will check CXR and labs.  CXR shows interstitial and airspace opacity in bilateral lungs with layering bilateral effusions.  Thought to be CHF/volume overload.  Waiting on labs before diuresis.  Fluids stopped.  Received 600 ml.  No evidence of infection.  Recent UA was negative with negative urine culture.  Rapid COVID test is negative, will follow with 6-24 hr test.  Patient is noted to be neutropenic, but is afebrile.  BNP is 1106, this with the CXR findings leads me to believe this is CHF/volume overload and not infection.  Troponin is elevated at 869, likely demand ischemia. No ischemic EKG findings.  Patient seen by and discussed with Dr. Dayna Barker.  Patient is taking Doxorubicin.  Consider chemotherapy induced cardiomyopathy.  Case discussed with Dr. Posey Pronto, from [facility], who is appreciated for admitting the patient.  [name] [name] was evaluated in Emergency Department on 10/09/2019 for the symptoms described in the history  of present illness. He was evaluated in the context of the global COVID-19 pandemic, which necessitated consideration that the patient might be at risk for infection with the SARS-CoV-2 virus that causes COVID-19. Institutional protocols and algorithms that pertain to the evaluation of patients at risk for COVID-19 are in a state of rapid change based on information released by regulatory bodies including the CDC and federal and state organizations. These policies and algorithms were followed during the patient's care in the ED.   Final Clinical Impression(s) / ED Diagnoses Final diagnoses:  Acute respiratory failure with hypoxia [facility])    Rx / DC Orders ED Discharge Orders    None       Delaine Lame 10/09/19 Alvira Philips, MD 10/09/19 0113    Montine Circle, PA-C 10/09/19 0459    Mesner, Corene Cornea, MD 10/09/19 [phone]

## 2019-10-08 NOTE — ED Notes (Addendum)
Unable to obtain orthostatic vital signs. Patient states he is feeling weak at this time and does not feel stable enough to stand. Pt states he normally ambulates with a cane but had a syncopal episode which caused him to fall.

## 2019-10-09 ENCOUNTER — Other Ambulatory Visit: Payer: Self-pay

## 2019-10-09 ENCOUNTER — Telehealth: Payer: Self-pay | Admitting: Internal Medicine

## 2019-10-09 ENCOUNTER — Inpatient Hospital Stay (HOSPITAL_COMMUNITY): Payer: HMO

## 2019-10-09 DIAGNOSIS — J9601 Acute respiratory failure with hypoxia: Secondary | ICD-10-CM | POA: Diagnosis present

## 2019-10-09 DIAGNOSIS — E785 Hyperlipidemia, unspecified: Secondary | ICD-10-CM | POA: Diagnosis not present

## 2019-10-09 DIAGNOSIS — J189 Pneumonia, unspecified organism: Secondary | ICD-10-CM | POA: Diagnosis not present

## 2019-10-09 DIAGNOSIS — Z20828 Contact with and (suspected) exposure to other viral communicable diseases: Secondary | ICD-10-CM | POA: Diagnosis not present

## 2019-10-09 DIAGNOSIS — I5021 Acute systolic (congestive) heart failure: Secondary | ICD-10-CM | POA: Diagnosis not present

## 2019-10-09 DIAGNOSIS — I509 Heart failure, unspecified: Secondary | ICD-10-CM

## 2019-10-09 DIAGNOSIS — R000 Tachycardia, unspecified: Secondary | ICD-10-CM | POA: Diagnosis not present

## 2019-10-09 DIAGNOSIS — R778 Other specified abnormalities of plasma proteins: Secondary | ICD-10-CM

## 2019-10-09 DIAGNOSIS — K219 Gastro-esophageal reflux disease without esophagitis: Secondary | ICD-10-CM | POA: Diagnosis not present

## 2019-10-09 DIAGNOSIS — T451X5A Adverse effect of antineoplastic and immunosuppressive drugs, initial encounter: Secondary | ICD-10-CM | POA: Diagnosis not present

## 2019-10-09 DIAGNOSIS — C865 Angioimmunoblastic T-cell lymphoma: Secondary | ICD-10-CM | POA: Diagnosis not present

## 2019-10-09 DIAGNOSIS — I110 Hypertensive heart disease with heart failure: Secondary | ICD-10-CM | POA: Diagnosis not present

## 2019-10-09 DIAGNOSIS — R6520 Severe sepsis without septic shock: Secondary | ICD-10-CM | POA: Diagnosis not present

## 2019-10-09 DIAGNOSIS — Z8249 Family history of ischemic heart disease and other diseases of the circulatory system: Secondary | ICD-10-CM | POA: Diagnosis not present

## 2019-10-09 DIAGNOSIS — E1169 Type 2 diabetes mellitus with other specified complication: Secondary | ICD-10-CM | POA: Diagnosis not present

## 2019-10-09 DIAGNOSIS — N179 Acute kidney failure, unspecified: Secondary | ICD-10-CM | POA: Diagnosis not present

## 2019-10-09 DIAGNOSIS — R0602 Shortness of breath: Secondary | ICD-10-CM | POA: Diagnosis not present

## 2019-10-09 DIAGNOSIS — Z515 Encounter for palliative care: Secondary | ICD-10-CM | POA: Diagnosis not present

## 2019-10-09 DIAGNOSIS — I361 Nonrheumatic tricuspid (valve) insufficiency: Secondary | ICD-10-CM | POA: Diagnosis not present

## 2019-10-09 DIAGNOSIS — M1990 Unspecified osteoarthritis, unspecified site: Secondary | ICD-10-CM | POA: Diagnosis not present

## 2019-10-09 DIAGNOSIS — D849 Immunodeficiency, unspecified: Secondary | ICD-10-CM | POA: Diagnosis not present

## 2019-10-09 DIAGNOSIS — Z96651 Presence of right artificial knee joint: Secondary | ICD-10-CM | POA: Diagnosis not present

## 2019-10-09 DIAGNOSIS — Z87891 Personal history of nicotine dependence: Secondary | ICD-10-CM | POA: Diagnosis not present

## 2019-10-09 DIAGNOSIS — C8440 Peripheral T-cell lymphoma, not classified, unspecified site: Secondary | ICD-10-CM | POA: Diagnosis not present

## 2019-10-09 DIAGNOSIS — G8929 Other chronic pain: Secondary | ICD-10-CM | POA: Diagnosis not present

## 2019-10-09 DIAGNOSIS — D61810 Antineoplastic chemotherapy induced pancytopenia: Secondary | ICD-10-CM | POA: Diagnosis not present

## 2019-10-09 DIAGNOSIS — Z833 Family history of diabetes mellitus: Secondary | ICD-10-CM | POA: Diagnosis not present

## 2019-10-09 DIAGNOSIS — A419 Sepsis, unspecified organism: Secondary | ICD-10-CM | POA: Diagnosis not present

## 2019-10-09 DIAGNOSIS — R55 Syncope and collapse: Secondary | ICD-10-CM | POA: Diagnosis not present

## 2019-10-09 DIAGNOSIS — R05 Cough: Secondary | ICD-10-CM | POA: Diagnosis not present

## 2019-10-09 DIAGNOSIS — Z96611 Presence of right artificial shoulder joint: Secondary | ICD-10-CM | POA: Diagnosis not present

## 2019-10-09 DIAGNOSIS — I10 Essential (primary) hypertension: Secondary | ICD-10-CM | POA: Diagnosis not present

## 2019-10-09 DIAGNOSIS — E1159 Type 2 diabetes mellitus with other circulatory complications: Secondary | ICD-10-CM | POA: Diagnosis not present

## 2019-10-09 DIAGNOSIS — Z66 Do not resuscitate: Secondary | ICD-10-CM | POA: Diagnosis not present

## 2019-10-09 DIAGNOSIS — Y95 Nosocomial condition: Secondary | ICD-10-CM | POA: Diagnosis not present

## 2019-10-09 DIAGNOSIS — Z7189 Other specified counseling: Secondary | ICD-10-CM | POA: Diagnosis not present

## 2019-10-09 DIAGNOSIS — Z800 Family history of malignant neoplasm of digestive organs: Secondary | ICD-10-CM | POA: Diagnosis not present

## 2019-10-09 DIAGNOSIS — I152 Hypertension secondary to endocrine disorders: Secondary | ICD-10-CM | POA: Diagnosis not present

## 2019-10-09 LAB — CBC WITH DIFFERENTIAL/PLATELET
Abs Immature Granulocytes: 0.04 10*3/uL (ref 0.00–0.07)
Basophils Absolute: 0 10*3/uL (ref 0.0–0.1)
Basophils Relative: 3 %
Eosinophils Absolute: 0 10*3/uL (ref 0.0–0.5)
Eosinophils Relative: 6 %
HCT: 27.1 % — ABNORMAL LOW (ref 39.0–52.0)
Hemoglobin: 9 g/dL — ABNORMAL LOW (ref 13.0–17.0)
Immature Granulocytes: 12 %
Lymphocytes Relative: 24 %
Lymphs Abs: 0.1 10*3/uL — ABNORMAL LOW (ref 0.7–4.0)
MCH: 28.3 pg (ref 26.0–34.0)
MCHC: 33.2 g/dL (ref 30.0–36.0)
MCV: 85.2 fL (ref 80.0–100.0)
Monocytes Absolute: 0.1 10*3/uL (ref 0.1–1.0)
Monocytes Relative: 28 %
Neutro Abs: 0.1 10*3/uL — ABNORMAL LOW (ref 1.7–7.7)
Neutrophils Relative %: 27 %
Platelets: 98 10*3/uL — ABNORMAL LOW (ref 150–400)
RBC: 3.18 MIL/uL — ABNORMAL LOW (ref 4.22–5.81)
RDW: 14.8 % (ref 11.5–15.5)
WBC: 0.3 10*3/uL — CL (ref 4.0–10.5)
nRBC: 0 % (ref 0.0–0.2)

## 2019-10-09 LAB — URINALYSIS, ROUTINE W REFLEX MICROSCOPIC
Bilirubin Urine: NEGATIVE
Glucose, UA: NEGATIVE mg/dL
Ketones, ur: NEGATIVE mg/dL
Leukocytes,Ua: NEGATIVE
Nitrite: NEGATIVE
Protein, ur: 30 mg/dL — AB
Specific Gravity, Urine: 1.008 (ref 1.005–1.030)
pH: 5 (ref 5.0–8.0)

## 2019-10-09 LAB — CBG MONITORING, ED
Glucose-Capillary: 116 mg/dL — ABNORMAL HIGH (ref 70–99)
Glucose-Capillary: 108 mg/dL — ABNORMAL HIGH (ref 70–99)
Glucose-Capillary: 101 mg/dL — ABNORMAL HIGH (ref 70–99)
Glucose-Capillary: 115 mg/dL — ABNORMAL HIGH (ref 70–99)

## 2019-10-09 LAB — SARS CORONAVIRUS 2 (TAT 6-24 HRS): SARS Coronavirus 2: NEGATIVE

## 2019-10-09 LAB — TROPONIN I (HIGH SENSITIVITY): Troponin I (High Sensitivity): 862 ng/L (ref <18)

## 2019-10-09 LAB — ECHOCARDIOGRAM COMPLETE

## 2019-10-09 LAB — MAGNESIUM: Magnesium: 1.6 mg/dL — ABNORMAL LOW (ref 1.7–2.4)

## 2019-10-09 LAB — C DIFFICILE QUICK SCREEN W PCR REFLEX
C Diff antigen: NEGATIVE
C Diff interpretation: NOT DETECTED
C Diff toxin: NEGATIVE

## 2019-10-09 MED ORDER — POTASSIUM CHLORIDE 20 MEQ/15ML (10%) PO SOLN
20.0000 meq | Freq: Every day | ORAL | Status: DC
  Administered 2019-10-09 – 2019-10-13 (×5): 20 meq via ORAL
  Filled 2019-10-09 (×6): qty 15

## 2019-10-09 MED ORDER — INSULIN ASPART 100 UNIT/ML ~~LOC~~ SOLN
0.0000 [IU] | Freq: Three times a day (TID) | SUBCUTANEOUS | Status: DC
  Administered 2019-10-17 (×2): 1 [IU] via SUBCUTANEOUS
  Administered 2019-10-18: 2 [IU] via SUBCUTANEOUS
  Administered 2019-10-19 – 2019-10-20 (×2): 1 [IU] via SUBCUTANEOUS
  Filled 2019-10-09: qty 0.09

## 2019-10-09 MED ORDER — FUROSEMIDE 10 MG/ML IJ SOLN
60.0000 mg | Freq: Two times a day (BID) | INTRAMUSCULAR | Status: DC
  Administered 2019-10-09 (×3): 60 mg via INTRAVENOUS
  Filled 2019-10-09 (×3): qty 8

## 2019-10-09 MED ORDER — SODIUM CHLORIDE 0.9% FLUSH
3.0000 mL | Freq: Two times a day (BID) | INTRAVENOUS | Status: DC
  Administered 2019-10-09 (×2): 3 mL via INTRAVENOUS

## 2019-10-09 MED ORDER — LOSARTAN POTASSIUM 50 MG PO TABS
100.0000 mg | ORAL_TABLET | Freq: Every day | ORAL | Status: DC
  Administered 2019-10-09: 100 mg via ORAL
  Filled 2019-10-09 (×2): qty 2

## 2019-10-09 MED ORDER — ATORVASTATIN CALCIUM 40 MG PO TABS
40.0000 mg | ORAL_TABLET | Freq: Every day | ORAL | Status: DC
  Administered 2019-10-09 – 2019-10-19 (×11): 40 mg via ORAL
  Filled 2019-10-09 (×12): qty 1

## 2019-10-09 MED ORDER — MAGNESIUM SULFATE 4 GM/100ML IV SOLN
4.0000 g | Freq: Once | INTRAVENOUS | Status: AC
  Administered 2019-10-09: 4 g via INTRAVENOUS
  Filled 2019-10-09: qty 100

## 2019-10-09 MED ORDER — ONDANSETRON HCL 4 MG/2ML IJ SOLN
4.0000 mg | Freq: Four times a day (QID) | INTRAMUSCULAR | Status: DC | PRN
  Administered 2019-10-10: 4 mg via INTRAVENOUS
  Filled 2019-10-09: qty 2

## 2019-10-09 MED ORDER — CARVEDILOL 12.5 MG PO TABS
12.5000 mg | ORAL_TABLET | Freq: Two times a day (BID) | ORAL | Status: DC
  Administered 2019-10-09 – 2019-10-20 (×22): 12.5 mg via ORAL
  Filled 2019-10-09 (×25): qty 1

## 2019-10-09 MED ORDER — PERFLUTREN LIPID MICROSPHERE
1.0000 mL | INTRAVENOUS | Status: AC | PRN
  Administered 2019-10-09: 3 mL via INTRAVENOUS
  Filled 2019-10-09: qty 10

## 2019-10-09 MED ORDER — ASPIRIN EC 81 MG PO TBEC
81.0000 mg | DELAYED_RELEASE_TABLET | Freq: Every day | ORAL | Status: DC
  Administered 2019-10-09 – 2019-10-20 (×12): 81 mg via ORAL
  Filled 2019-10-09 (×12): qty 1

## 2019-10-09 MED ORDER — ACETAMINOPHEN 325 MG PO TABS
650.0000 mg | ORAL_TABLET | ORAL | Status: DC | PRN
  Administered 2019-10-10 – 2019-10-15 (×3): 650 mg via ORAL
  Filled 2019-10-09 (×3): qty 2

## 2019-10-09 MED ORDER — SODIUM CHLORIDE 0.9% FLUSH: 3.0000 mL | INTRAVENOUS | Status: DC | PRN

## 2019-10-09 MED ORDER — SODIUM CHLORIDE 0.9 % IV SOLN: 250.0000 mL | INTRAVENOUS | Status: DC | PRN

## 2019-10-09 NOTE — Progress Notes (Signed)
PROGRESS NOTE  [name] [name]  DOB: [birth year]  PCP: Colon Branch, MD [id number]  DOA: 10/08/2019  LOS: 0 days   Chief Complaint  Patient presents with  . Weakness   Brief narrative: [name] [name] is a 78 y.o. 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  Patient reports about 10 months of progressive shortness of breath.  He was diagnosed with stage IV angioimmunoblastic T-cell lymphoma in October 2020.  He had an echocardiogram on 09/12/2019 which showed an EF of 60-65% with grade 1 diastolic dysfunction.  He completed cycle 1 of chemotherapy with doxorubicin, Cytoxan, Adcetris on 09/29/2019.  Patient states over the last few days he has been having worsening shortness of breath with orthopnea and paroxysmal nocturnal dyspnea.  He says he has swelling in both of his legs which has been ongoing for a long period of time.  He has a nonproductive cough.  He reports some epigastric/substernal chest discomfort without radiation, currently chest pain-free.  He reports having acid reflux/heartburn.  He denies any subjective fevers, chills, diaphoresis.  He reports having increased urinary frequency without dysuria.  Since starting chemotherapy he has developed chemotherapy associated pancytopenia.  He has not had any obvious bleeding.  He has required occasional fluid boluses due to acute kidney injury.  ED Course:  Initial vitals showed BP 121/60, pulse 96, RR 22, temp 98.7 Fahrenheit, SPO2 96% on 2.5 L supplemental O2 via [place].  Labs notable for WBC 0.3, hemoglobin 8.9, platelets 83,000, sodium 136, potassium 3.4, bicarb 24, BUN 14, creatinine 1.09, AST 13, ALT 14, alk phos 69, total bilirubin 1.4, high-sensitivity troponin I 869, BNP 1106.6.  POC SARS-CoV-2 antigen test is  negative.  SARS-CoV-2 PCR test negative as well.  Portable chest x-ray shows right Port-A-Cath in place and patchy interstitial and airspace opacities throughout both lungs with bilateral effusions.  EKG shows sinus tachycardia, first-degree AV block, T wave flattening throughout, and low voltage.  Patient was given 1 L normal saline and the hospitalist service was consulted to admit for further evaluation and management.  Subjective: Patient was seen and examined this morning.  Pleasant elderly Caucasian male.  On oxygen by nasal cannula.  No new symptoms.  Assessment/Plan: [name] [name] is a 78 y.o. 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  Acute respiratory failure with hypoxia  Acute congestive heart failure: -Presented with orthopnea, pedal edema, volume overload state -BNP is 1106.6.   -Chest x-ray shows pulmonary edema.  -Echocardiogram 09/12/2019 showed EF 60-65% with grade 1 diastolic dysfunction prior to beginning chemotherapy. -Currently on IV Lasix 60 mg twice daily -Repeat echocardiogram today.  Pending report -Monitor strict I/O's, daily weights -Supplemental oxygen as needed -Continue home losartan and Coreg -Continue to monitor on telemetry -Supplement potassium  Elevated troponin: High-sensitivity troponin I 869. Suspect demand ischemia from above, continue to trend. Continue aspirin 81 mg for now, continue to monitor platelets.  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 Maylon Peppers.  Completed cycle 1 of doxorubicin, Cytoxan, and Adcetris on 09/29/2019.  He is afebrile and without any obvious bleeding.  Leukopenia worsened from prior with  relatively stable hemoglobin and platelet count.  Continue to monitor CBC.  Hypokalemia/hypomagnesemia -Potassium and magnesium supplemented.  Hypertension: Continue losartan, Coreg, and IV Lasix as above.  Type 2 diabetes: On Metformin as an outpatient.  A1c is 6.1.  Will use sensitive SSI while in hospital.  Hyperlipidemia: Continue statin.  Mobility: PT eval Diet: Cardiac diet Fluid: Not on IV fluids DVT prophylaxis:  SCDs Code Status:  DNR Family Communication:  Not at bedside Expected Discharge:  Continue inpatient care  Consultants:  None  Procedures:  None  Antimicrobials: Anti-infectives (From admission, onward)   None        Code Status: DNR   Diet Order            Diet heart healthy/carb modified Room service appropriate? Yes; Fluid consistency: Thin; Fluid restriction: 1500 mL Fluid  Diet effective now              Infusions:  . sodium chloride      Scheduled Meds: . aspirin EC  81 mg Oral Daily  . atorvastatin  40 mg Oral q1800  . carvedilol  12.5 mg Oral BID WC  . furosemide  60 mg Intravenous BID  . insulin aspart  0-9 Units Subcutaneous TID WC  . losartan  100 mg Oral Daily  . potassium chloride  20 mEq Oral Daily  . sodium chloride flush  3 mL Intravenous Q12H    PRN meds: sodium chloride, acetaminophen, ondansetron (ZOFRAN) IV, perflutren lipid microspheres (DEFINITY) IV suspension, sodium chloride flush   Objective: Vitals:   10/09/19 1121 10/09/19 1130  BP: 104/63 109/63  Pulse: 81 83  Resp: (!) 24 (!) 22  Temp:    SpO2: 99% 98%    Intake/Output Summary (Last 24 hours) at 10/09/2019 1435 Last data filed at 10/08/2019 2315 Gross per 24 hour  Intake 600 ml  Output --  Net 600 ml   There were no vitals filed for this visit. Weight change:  There is no height or weight on file to calculate BMI.   Physical Exam: General exam: Appears calm and comfortable.  Skin: No rashes, lesions or ulcers. HEENT: Atraumatic,  normocephalic, supple neck, no obvious bleeding Lungs: Clear to auscultate bilaterally CVS: Regular rate and rhythm, no murmur GI/Abd soft, distended from obesity, nontender, bowel sound present CNS: Alert, awake, oriented x3 Psychiatry: Mood appropriate Extremities: Pedal edema trace to 1+ bilaterally  Data Review: I have personally reviewed the laboratory data and studies available.  Recent Labs  Lab 10/06/19 0855 10/08/19 2211 10/09/19 0456  WBC 2.5* 0.3* 0.3*  NEUTROABS 1.6* 0.1* 0.1*  HGB 9.5* 8.9* 9.0*  HCT 28.2* 27.4* 27.1*  MCV 83.7 85.4 85.2  PLT 89* 83* 98*   Recent Labs  Lab 10/06/19 0855 10/08/19 2211 10/09/19 0456  NA 138 136  --   K 4.0 3.4*  --   CL 103 104  --   CO2 26 24  --   GLUCOSE 116* 138*  --   BUN 28* 14  --   CREATININE 0.83 1.09  --   CALCIUM 8.6* 7.8*  --   MG  --   --  1.6*    Terrilee Croak, MD  [facility] 10/09/2019

## 2019-10-09 NOTE — Progress Notes (Signed)
  Echocardiogram 2D Echocardiogram has been performed.  Burnett Kanaris 10/09/2019, 2:36 PM

## 2019-10-09 NOTE — H&P (Signed)
History and Physical    [name] [name] PFX:[id number] DOB: 03/29/1941 DOA: 10/08/2019  PCP: Colon Branch, MD  Patient coming from: Home  I have personally briefly reviewed patient's old medical records in [place]  Chief Complaint: Dyspnea  HPI: [name] [name] is a 78 y.o. 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  Patient reports about 10 months of progressive shortness of breath.  He was diagnosed with stage IV angioimmunoblastic T-cell lymphoma in October 2020.  He had an echocardiogram on 09/12/2019 which showed an EF of 60-65% with grade 1 diastolic dysfunction.  He completed cycle 1 of chemotherapy with doxorubicin, Cytoxan, Adcetris on 09/29/2019.  Patient states over the last few days he has been having worsening shortness of breath with orthopnea and paroxysmal nocturnal dyspnea.  He says he has swelling in both of his legs which has been ongoing for a long period of time.  He has a nonproductive cough.  He reports some epigastric/substernal chest discomfort without radiation, currently chest pain-free.  He reports having acid reflux/heartburn.  He denies any subjective fevers, chills, diaphoresis.  He reports having increased urinary frequency without dysuria.  Since starting chemotherapy he has developed chemotherapy associated pancytopenia.  He has not had any obvious bleeding.  He has required occasional fluid boluses due to acute kidney injury.  ED Course:  Initial vitals showed BP 121/60, pulse 96, RR 22, temp 98.7 Fahrenheit, SPO2 96% on 2.5 L supplemental O2 via [place].  Labs notable for WBC 0.3, hemoglobin 8.9, platelets 83,000, sodium 136, potassium 3.4, bicarb 24, BUN 14, creatinine 1.09, AST 13, ALT 14, alk phos 69, total bilirubin 1.4, high-sensitivity  troponin I 869, BNP 1106.6.  POC SARS-CoV-2 antigen test is negative.  SARS-CoV-2 PCR test is obtained and pending.  Portable chest x-ray shows right Port-A-Cath in place and patchy interstitial and airspace opacities throughout both lungs with bilateral effusions.  EKG shows sinus tachycardia, first-degree AV block, T wave flattening throughout, and low voltage.  Patient was given 1 L normal saline and the hospitalist service was consulted to admit for further evaluation and management.  Review of Systems: All systems reviewed and are negative except as documented in history of present illness above.   Past Medical History:  Diagnosis Date  . Arthritis   . Complication of anesthesia     had spinal with knee surgeries  -"heart rate too low" for general  . Detached retina, left    L eye normal vision, reports that he has had a retina procedure in a doctor's office at [facility]   . Diabetes mellitus without complication ([place])   . Dysrhythmia    slight irregular rate  . GERD (gastroesophageal reflux disease)    hx no problems now  . H/O echocardiogram 2014  . History of hiatal hernia    ?20 yrs ago  . Hyperopia 2016  . Hypertension   . Lumbar stenosis with neurogenic claudication   . MG, ocular (myasthenia gravis) ([place])    ? of L eye,   . Prediabetes   . Presbyopia OU 2016    Past Surgical History:  Procedure Laterality Date  . COLONOSCOPY    . HERNIA REPAIR     bilateral inguinal hernia  . HERNIA REPAIR     umbilical  . IR IMAGING GUIDED PORT INSERTION  09/16/2019  .  JOINT REPLACEMENT  2009   right knee  . LUMBAR LAMINECTOMY/DECOMPRESSION MICRODISCECTOMY N/A 06/06/2017   Procedure: Decompression L3-5, insitu fusion L3-5 ;  Surgeon: Melina Schools, MD;  Location: [place];  Service: Orthopedics;  Laterality: N/A;  . LYMPH NODE BIOPSY Right 09/01/2019   Procedure: CERVICAL LYMPH NODE BIOPSY;  Surgeon: [name], MD;  Location: [place];  Service: ENT;  Laterality: Right;  .  MIDDLE EAR SURGERY Right    ear -patched hole in ear drum  . TOTAL KNEE ARTHROPLASTY  09/10/2012   Procedure: TOTAL KNEE ARTHROPLASTY;  Surgeon: Tobi Bastos, MD;  Location: [facility];  Service: Orthopedics;  Laterality: Left;  . TOTAL SHOULDER ARTHROPLASTY Right 06/22/2016   Procedure: RIGHT TOTAL SHOULDER ARTHROPLASTY;  Surgeon: [name], MD;  Location: [place];  Service: Orthopedics;  Laterality: Right;    Social History:  reports that he quit smoking about 53 years ago. His smoking use included cigarettes. He has a 1.00 pack-year smoking history. He has never used smokeless tobacco. He reports that he does not drink alcohol or use drugs.  Allergies  Allergen Reactions  . Doxycycline Rash    Family History  Problem Relation Age of Onset  . Diabetes Brother   . Colon cancer Brother 61       dx in 2018  . Hypertension Sister   . Prostate cancer Neg Hx   . Stroke Neg Hx   . CAD Neg Hx      Prior to Admission medications   Medication Sig Start Date End Date Taking? Authorizing Provider  allopurinol (ZYLOPRIM) 300 MG tablet Take 1 tablet (300 mg total) by mouth daily. 09/30/19   Tish Men, MD  aspirin 81 MG tablet Take 81 mg by mouth daily.    [provider]  carvedilol (COREG) 12.5 MG tablet Take 1 tablet (12.5 mg total) by mouth 2 (two) times daily with a meal. 06/20/19   Colon Branch, MD  chlorthalidone (HYGROTON) 25 MG tablet Take 0.5 tablets (12.5 mg total) by mouth daily. 07/16/19   Colon Branch, MD  hydrochlorothiazide (HYDRODIURIL) 25 MG tablet Take 25 mg by mouth daily. 08/14/19   [provider]  HYDROcodone-acetaminophen (NORCO) 7.5-325 MG tablet Take 1 tablet by mouth 3 (three) times daily as needed for moderate pain. 10/08/19   Colon Branch, MD  ketoconazole (NIZORAL) 2 % cream Apply 1 application topically daily. Patient taking differently: Apply 1 application topically daily as needed for irritation.  05/28/19   Colon Branch, MD  LORazepam (ATIVAN) 0.5  MG tablet Take 1 tablet (0.5 mg total) by mouth every 6 (six) hours as needed (Nausea or vomiting). 10/08/19   Tish Men, MD  losartan (COZAAR) 100 MG tablet Take 1 tablet (100 mg total) by mouth daily. 05/15/19   Colon Branch, MD  metFORMIN (GLUCOPHAGE) 500 MG tablet Take 1 tablet (500 mg total) by mouth daily with breakfast. 10/07/19   Colon Branch, MD  Nutritional Supplements (GLUCERNA 1.0 CAL/CARBSTEADY) LIQD Take 1 Can by mouth daily. Okay to drink up to 3 cans/bottles daily to replace meals as needed. 09/23/19   Colon Branch, MD  Omega-3 Fatty Acids (FISH OIL) 500 MG CAPS Take 1 capsule by mouth daily.    [provider]  omeprazole (PRILOSEC) 20 MG capsule Take 1 capsule (20 mg total) by mouth daily. Patient taking differently: Take 20 mg by mouth 2 (two) times daily before a meal.  10/02/16   [name], [place],  MD  ondansetron (ZOFRAN) 8 MG tablet Take 1 tablet (8 mg total) by mouth 2 (two) times daily as needed for refractory nausea / vomiting. Start on day 3 after cyclophosphamide chemotherapy. 09/30/19   Tish Men, MD  potassium chloride (KLOR-CON M10) 10 MEQ tablet Take 1 tablet (10 mEq total) by mouth daily. 05/15/19   Colon Branch, MD  prochlorperazine (COMPAZINE) 10 MG tablet Take 1 tablet (10 mg total) by mouth every 6 (six) hours as needed (Nausea or vomiting). 09/30/19   Tish Men, MD  simvastatin (ZOCOR) 80 MG tablet Take 0.5 tablets (40 mg total) by mouth daily. 10/07/18   Colon Branch, MD    Physical Exam: Vitals:   10/08/19 2230 10/08/19 2300 10/08/19 2330 10/09/19 0000  BP: 130/69 133/83 138/75 (!) 142/79  Pulse: 99 (!) 102 (!) 101 (!) 106  Resp: (!) 24 (!) 22 (!) 21 (!) 22  Temp:      TempSrc:      SpO2: 96% 95% 96% 97%    Constitutional: Obese man resting supine in bed, appears somewhat uncomfortable Eyes: PERRL, lids and conjunctivae normal ENMT: Mucous membranes are moist. Posterior pharynx clear of any exudate or lesions. Neck: normal, supple, no masses. Respiratory:  Rhonchorous breath sounds bilateral lung fields.  Increased respiratory effort. No accessory muscle use.  Cardiovascular: Tachycardic, no murmurs / rubs / gallops.  +3 lower extremity edema bilaterally.  Feet are warm. Abdomen: Obese/distended abdomen, and nontender.  Bowel sounds positive.  Musculoskeletal: no clubbing / cyanosis. No joint deformity upper and lower extremities. Good ROM, no contractures. Normal muscle tone.  Skin: no rashes, lesions, ulcers. No induration Neurologic: CN 2-12 grossly intact. Sensation intact, Strength 5/5 in all 4.  Psychiatric: Normal judgment and insight. Alert and oriented x 3. Normal mood.     Labs on Admission: I have personally reviewed following labs and imaging studies  CBC: Recent Labs  Lab 10/06/19 0855 10/08/19 2211  WBC 2.5* 0.3*  NEUTROABS 1.6* 0.1*  HGB 9.5* 8.9*  HCT 28.2* 27.4*  MCV 83.7 85.4  PLT 89* 83*   Basic Metabolic Panel: Recent Labs  Lab 10/06/19 0855 10/08/19 2211  NA 138 136  K 4.0 3.4*  CL 103 104  CO2 26 24  GLUCOSE 116* 138*  BUN 28* 14  CREATININE 0.83 1.09  CALCIUM 8.6* 7.8*   GFR: CrCl cannot be calculated (Unknown ideal weight.). Liver Function Tests: Recent Labs  Lab 10/06/19 0855 10/08/19 2211  AST 11* 13*  ALT 9 14  ALKPHOS 88 69  BILITOT 0.7 1.4*  PROT 6.1* 6.2*  ALBUMIN 3.5 2.8*   No results for input(s): LIPASE, AMYLASE in the last 168 hours. No results for input(s): AMMONIA in the last 168 hours. Coagulation Profile: No results for input(s): INR, PROTIME in the last 168 hours. Cardiac Enzymes: No results for input(s): CKTOTAL, CKMB, CKMBINDEX, TROPONINI in the last 168 hours. BNP (last 3 results) Recent Labs    09/23/19 1714  PROBNP 115.0*   HbA1C: No results for input(s): HGBA1C in the last 72 hours. CBG: No results for input(s): GLUCAP in the last 168 hours. Lipid Profile: No results for input(s): CHOL, HDL, LDLCALC, TRIG, CHOLHDL, LDLDIRECT in the last 72 hours. Thyroid  Function Tests: No results for input(s): TSH, T4TOTAL, FREET4, T3FREE, THYROIDAB in the last 72 hours. Anemia Panel: No results for input(s): VITAMINB12, FOLATE, FERRITIN, TIBC, IRON, RETICCTPCT in the last 72 hours. Urine analysis:    Component Value Date/Time   COLORURINE  YELLOW 10/06/2019 [place] 10/06/2019 1204   LABSPEC 1.020 10/06/2019 1204   PHURINE 6.0 10/06/2019 1204   GLUCOSEU NEGATIVE 10/06/2019 1204   HGBUR NEGATIVE 10/06/2019 1204   HGBUR negative 12/07/2010 [place] 10/06/2019 [place] 10/06/2019 1204   PROTEINUR 30 (A) 10/06/2019 1204   UROBILINOGEN 0.2 09/04/2012 0950   NITRITE NEGATIVE 10/06/2019 1204   LEUKOCYTESUR NEGATIVE 10/06/2019 1204    Radiological Exams on Admission: DG Chest Port 1 View  Result Date: 10/08/2019 CLINICAL DATA:  Cough, shortness of breath, weakness EXAM: PORTABLE CHEST 1 VIEW COMPARISON:  Radiograph 09/23/2019 FINDINGS: Globally increased opacity in the right hemithorax with indistinctness of the right lung base likely reflecting some layering pleural fluid on a background of more diffuse interstitial airspace opacities. Layering left pleural effusion is present as well. A right IJ approach Port-A-Cath tip terminates in the superior cavoatrial junction. Cardiomegaly is similar to priors. IMPRESSION: Patchy interstitial and airspace opacity throughout both lungs with layering bilateral effusions. Appearance favoring CHF/volume overload though infection could have a similar appearance. Electronically Signed   By: Lovena Le M.D.   On: 10/08/2019 22:45    EKG: Independently reviewed. Sinus tachycardia, first-degree AV block, low voltage, T wave flattening throughout.  When compared to prior, rate is faster and T wave changes are new.  Assessment/Plan Principal Problem:   Acute respiratory failure with hypoxia (HCC) Active Problems:   Hyperlipidemia associated with type 2 diabetes mellitus  ([place])   Hypertension associated with diabetes ([place])   Diabetes mellitus, type II ([place])   Angioimmunoblastic lymphoma ([place])   Acute CHF (congestive heart failure) (HCC)   Elevated troponin   Antineoplastic chemotherapy induced pancytopenia (HCC)  [name] [name] is a 78 y.o. 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  Acute respiratory failure with hypoxia secondary to acute congestive heart failure: Presentation suspicious for acute CHF, possibly secondary to chemotherapy.  He is significantly volume overloaded at time of admission.  BNP is 1106.6.  Chest x-ray shows pulmonary edema. Echocardiogram 09/12/2019 showed EF 60-65% with grade 1 diastolic dysfunction prior to beginning chemotherapy. -Start IV Lasix 60 mg twice daily -Update echocardiogram -Monitor strict I/O's, daily weights -Supplemental oxygen as needed -Continue home losartan and Coreg -Monitor on telemetry -Supplement potassium -POC SARS-CoV-2 antigen test is negative, PCR test is pending  Elevated troponin: High-sensitivity troponin I 869.  Suspect demand ischemia from above, continue to trend.  Continue aspirin 81 mg for now, continue to monitor platelets.  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 Maylon Peppers.  Completed cycle 1 of doxorubicin, Cytoxan, and Adcetris on 09/29/2019.  He is afebrile and without any obvious bleeding.  Leukopenia worsened from prior with relatively stable hemoglobin and platelet count.  Continue to monitor CBC.  Hypokalemia: Supplement and replete as needed.  Hypertension: Continue losartan, Coreg, and IV Lasix as above.  Type 2 diabetes: On Metformin as an outpatient.  A1c is 6.1.   Will use sensitive SSI while in hospital.  Hyperlipidemia: Continue statin.  DVT prophylaxis: SCDs Code Status: DNR, confirmed with patient Family Communication: Discussed with wife Diane by phone at [phone] Disposition Plan: Pending clinical progress Consults called: None Admission status: Admit - It is my clinical opinion that admission to INPATIENT is  reasonable and necessary because of the expectation that this patient will require hospital care that crosses at least 2 midnights to treat this condition based on the medical complexity of the problems presented.  Given the aforementioned information, the predictability of an adverse outcome is felt to be significant.   Zada Finders MD [facility]  If 7PM-7AM, please contact night-coverage [email or web address]  10/09/2019, 1:08 AM

## 2019-10-09 NOTE — Telephone Encounter (Addendum)
Pt's daughter [name] called this am to check on father.  She is on the [facility].  Please call her when pt is in a room with updates.  [phone]  Please also let the pt know his daughter called and is thinking of him and called to check on him. Thank you.

## 2019-10-09 NOTE — ED Notes (Signed)
Daughter of patient , Irving Copas would like a call with an update [phone].

## 2019-10-09 NOTE — Patient Outreach (Signed)
  [facility]) Care Management Chronic Special Needs Program    10/09/2019  Name: [name], [name]: [birth year]  MRN: [id number]   [name] is enrolled in a chronic special needs plan for Diabetes.Client noted to be admitted due to acute respiratory failure with hypoxia. Per policy procedure, care plan sent to hospital utilization management department.  Plan: RNCM will collaborate with [facility] hospital liaison and assist with discharge planning as indicated. RNCM will continue to follow.  Thea Silversmith, RN, MSN, [place] [facility] [phone]

## 2019-10-10 DIAGNOSIS — I5021 Acute systolic (congestive) heart failure: Secondary | ICD-10-CM

## 2019-10-10 LAB — BASIC METABOLIC PANEL
Anion gap: 14 (ref 5–15)
BUN: 27 mg/dL — ABNORMAL HIGH (ref 8–23)
CO2: 20 mmol/L — ABNORMAL LOW (ref 22–32)
Calcium: 7.5 mg/dL — ABNORMAL LOW (ref 8.9–10.3)
Chloride: 102 mmol/L (ref 98–111)
Creatinine, Ser: 1.82 mg/dL — ABNORMAL HIGH (ref 0.61–1.24)
GFR calc Af Amer: 41 mL/min — ABNORMAL LOW (ref >60)
GFR calc non Af Amer: 35 mL/min — ABNORMAL LOW (ref >60)
Glucose, Bld: 122 mg/dL — ABNORMAL HIGH (ref 70–99)
Potassium: 3.4 mmol/L — ABNORMAL LOW (ref 3.5–5.1)
Sodium: 136 mmol/L (ref 135–145)

## 2019-10-10 LAB — GLUCOSE, CAPILLARY
Glucose-Capillary: 105 mg/dL — ABNORMAL HIGH (ref 70–99)
Glucose-Capillary: 134 mg/dL — ABNORMAL HIGH (ref 70–99)

## 2019-10-10 LAB — URINE CULTURE: Culture: <10000 — AB

## 2019-10-10 MED ORDER — CHLORHEXIDINE GLUCONATE CLOTH 2 % EX PADS: 6.0000 | MEDICATED_PAD | Freq: Every day | CUTANEOUS | Status: DC

## 2019-10-10 NOTE — ED Notes (Signed)
Patient given meal tray at this time. Patient told RN "Im not hungry right now but please leave the tray here".

## 2019-10-10 NOTE — Progress Notes (Signed)
PROGRESS NOTE  [name] [name]  DOB: 1941/08/12  PCP: Colon Branch, MD EGB:[id number]  DOA: 10/08/2019  LOS: 1 day   Chief Complaint  Patient presents with  . Weakness   Brief narrative: [name] [name] is a 78 y.o. 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  Patient reports about 10 months of progressive shortness of breath.  He was diagnosed with stage IV angioimmunoblastic T-cell lymphoma in October 2020.  He had an echocardiogram on 09/12/2019 which showed an EF of 60-65% with grade 1 diastolic dysfunction.  He completed cycle 1 of chemotherapy with doxorubicin, Cytoxan, Adcetris on 09/29/2019. Since starting chemotherapy he has developed chemotherapy associated pancytopenia.  He has not had any obvious bleeding.  He has required occasional fluid boluses due to acute kidney injury. In the ED, patient required 2 to 3 L oxygen via nasal cannula to maintain O2 sat. Labs notable for WBC 0.3, hemoglobin 8.9, platelets 83,000, sodium 136, potassium 3.4, bicarb 24, BUN 14, creatinine 1.09, AST 13, ALT 14, alk phos 69, total bilirubin 1.4, high-sensitivity troponin I 869, BNP 1106.6. SARS-CoV-2 PCR test negative as well. Portable chest x-ray showed right Port-A-Cath in place and patchy interstitial and airspace opacities throughout both lungs with bilateral effusions.  EKG showed sinus tachycardia, first-degree AV block, T wave flattening throughout, and low voltage. Hospitalist service was consulted to admit for further evaluation and management.  Subjective: Patient was seen and examined this morning.  Pleasant elderly Caucasian male.  On oxygen by nasal cannula.  No new symptoms. Patient is having ongoing diarrhea since  yesterday.  Assessment/Plan: Acute respiratory failure with hypoxia  Acute congestive heart failure: -Presented with orthopnea, PND, pedal edema, volume overload state -BNP is 1106.6.   -Chest x-ray showed pulmonary edema.  -Echocardiogram 09/12/2019 showed EF 60-65% with grade 1 diastolic dysfunction prior to beginning chemotherapy. -Repeat echocardiogram on 12/7 showed a drop in EF to 45 to 50% with mild hypokinesis of the left ventricle, grade 1 diastolic dysfunction. -Currently on IV Lasix 60 mg twice daily -Creatinine worsened from 1.09 yesterday to 1.82 today.  Held Lasix this morning. Continue Coreg.  Hold losartan. -Continue to monitor in telemetry. -Monitor strict I/O's, daily weights -Continue supplemental oxygen as needed.  Elevated troponin: New drop in ejection fraction High-sensitivity troponin I 869. Suspect demand ischemia from above, continue to trend. Continue aspirin 81 mg for now, continue to monitor platelets. -With new drop in EF, I will cardiology consultation.  Acute diarrhea -Patient apparently has had multiple episodes of diarrhea since presentation. -C. difficile negative.  GI pathogen panel sent. -May benefit from a Flexi-Seal.  Acute kidney injury -Baseline creatinine less than 1.  Creatinine elevated to 1.82 today.  The problem is likely because of dehydration from diarrhea as well as Lasix. -Hold IV Lasix.  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 Maylon Peppers.  Completed cycle 1 of doxorubicin, Cytoxan, and Adcetris on 09/29/2019.  He is afebrile and without any obvious bleeding.  Leukopenia worsened from prior with relatively stable hemoglobin and platelet count.  Continue to monitor CBC.  Hypokalemia/hypomagnesemia -Potassium and magnesium low likely because of diarrhea.  Replaced.  Hypertension: Continue Coreg.  Losartan and Lasix as above.  Type 2 diabetes: On Metformin as an  outpatient.  A1c is 6.1.  Will use sensitive SSI while in hospital.  Hyperlipidemia: Continue statin.  Mobility: PT eval Diet: Cardiac diet Fluid: Not on IV fluids DVT prophylaxis:  SCDs Code Status:  DNR Family Communication:  Not at bedside Expected Discharge:  Continue inpatient care  Consultants:  None  Procedures:  None  Antimicrobials: Anti-infectives (From admission, onward)   None        Code Status: DNR   Diet Order            Diet heart healthy/carb modified Room service appropriate? Yes; Fluid consistency: Thin; Fluid restriction: 1500 mL Fluid  Diet effective now              Infusions:    Scheduled Meds: . aspirin EC  81 mg Oral Daily  . atorvastatin  40 mg Oral q1800  . carvedilol  12.5 mg Oral BID WC  . Chlorhexidine Gluconate Cloth  6 each Topical Daily  . insulin aspart  0-9 Units Subcutaneous TID WC  . potassium chloride  20 mEq Oral Daily    PRN meds: acetaminophen, ondansetron (ZOFRAN) IV   Objective: Vitals:   10/10/19 1130 10/10/19 1237  BP: 107/61 115/67  Pulse: 66 66  Resp: 18 20  Temp:  97.7 F (36.5 C)  SpO2: 98% 100%    Intake/Output Summary (Last 24 hours) at 10/10/2019 1356 Last data filed at 10/09/2019 1857 Gross per 24 hour  Intake 100 ml  Output --  Net 100 ml   There were no vitals filed for this visit. Weight change:  There is no height or weight on file to calculate BMI.   Physical Exam: General exam: Appears calm and comfortable.  Skin: No rashes, lesions or ulcers. HEENT: Atraumatic, normocephalic, supple neck, no obvious bleeding Lungs: Clear to auscultate bilaterally CVS: Regular rate and rhythm, no murmur GI/Abd soft, distended from obesity, nontender, bowel sound present CNS: Alert, awake, oriented x3 Psychiatry: Mood appropriate Extremities: Pedal edema trace to 1+ bilaterally  Data Review: I have personally reviewed the laboratory data and studies available.  Recent Labs  Lab  10/06/19 0855 10/08/19 2211 10/09/19 0456  WBC 2.5* 0.3* 0.3*  NEUTROABS 1.6* 0.1* 0.1*  HGB 9.5* 8.9* 9.0*  HCT 28.2* 27.4* 27.1*  MCV 83.7 85.4 85.2  PLT 89* 83* 98*   Recent Labs  Lab 10/06/19 0855 10/08/19 2211 10/09/19 0456 10/10/19 0500  NA 138 136  --  136  K 4.0 3.4*  --  3.4*  CL 103 104  --  102  CO2 26 24  --  20*  GLUCOSE 116* 138*  --  122*  BUN 28* 14  --  27*  CREATININE 0.83 1.09  --  1.82*  CALCIUM 8.6* 7.8*  --  7.5*  MG  --   --  1.6*  --     Terrilee Croak, MD  [facility] 10/10/2019

## 2019-10-10 NOTE — ED Notes (Signed)
Pt provided peri-care, new brief, washed w/ peri-care w/soap, condom cath applied, pt repositioned, pt comfortable per pt by EMT & RN.

## 2019-10-10 NOTE — Consult Note (Signed)
CARDIOLOGY CONSULT NOTE       Patient ID: [name] [name] MRN: [id number] DOB/AGE: [birth year] 77 y.o.  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 No previous heart issues just HTN and benign palpitations Seen in ER with dyspnea and expected pancytopenia. No chest pain  BNP 1106 trponin 869.  ECG ST 99 low voltage no acute ST changes. CXR with patchy interstitial airspace disease with layering bilateral effusion Pre chemo echo no strain done but moderate LVH and EF 60-65% 09/12/19  Echo done now 12/17 showed EF 45-50% with LVE. Note patient is DNR He has actually been given saline bolus as his Cr which is usually normal bumped to 3.58 12/10.  He is currently only written for coreg in regard to his heart Cr improved to 1.8 range   ROS All other systems reviewed and negative except as noted above  Past Medical History:  Diagnosis Date  . Arthritis   . Complication of anesthesia     had spinal with knee surgeries  -"heart rate too low" for general  . Detached retina, left    L eye normal vision, reports [facility]  . Diabetes mellitus without complication ([place]   . Dysrhythmia    slight irregular rate  . GERD (gastroesophageal reflux disease)    hx no problems now  . H/O echocardiogram 2014  . History of hiatal  hernia    ?20 yrs ago  . Hyperopia 2016  . Hypertension   . Lumbar stenosis with neurogenic claudication   . MG, ocular (myasthenia gravis) ([place]    ? of L eye,   . Prediabetes   . Presbyopia OU 2016    Family History  Problem Relation Age of Onset  . Diabetes Brother   . Colon cancer Brother 786      dx in 2018  . Hypertension Sister   . Prostate cancer Neg Hx   . Stroke Neg Hx   . CAD Neg Hx     Social History   Socioeconomic History  . Marital status: Married    Spouse name: Not on file  . Number of children: 4  . Years of education: Not on file  . Highest education level: Not on file  Occupational History  . Occupation: mAudiological scientist- retired 2010  Tobacco Use  . Smoking status: Former Smoker    Packs/day: 0.50    Years: 2.00    Pack years: 1.00    Types: Cigarettes    Quit date: 06/09/1966    Years since quitting: 53.3  . Smokeless tobacco: Never Used  Substance and Sexual Activity  . Alcohol use: No    Alcohol/week: 0.0 standard drinks    Comment: none  . Drug use: No  . Sexual activity: Yes    Partners: Female  Other Topics Concern  . Not on file  Social History  Narrative   married, lives w/ second wife, 2 living children, lost 2 kids     Has a small dog   Social Determinants of Radio broadcast assistant Strain:   . Difficulty of Paying Living Expenses: Not on file  Food Insecurity:   . Worried About Charity fundraiser in the Last Year: Not on file  . Ran Out of Food in the Last Year: Not on file  Transportation Needs:   . Lack of Transportation (Medical): Not on file  . Lack of Transportation (Non-Medical): Not on file  Physical Activity:   . Days of Exercise per Week: Not on file  . Minutes of Exercise per Session: Not on file  Stress:   . Feeling of Stress : Not on file  Social Connections:   . Frequency of Communication with Friends and Family: Not on file  . Frequency of Social Gatherings with Friends and Family: Not on file  .  Attends Religious Services: Not on file  . Active Member of Clubs or Organizations: Not on file  . Attends Archivist Meetings: Not on file  . Marital Status: Not on file  Intimate Partner Violence:   . Fear of Current or Ex-Partner: Not on file  . Emotionally Abused: Not on file  . Physically Abused: Not on file  . Sexually Abused: Not on file    Past Surgical History:  Procedure Laterality Date  . COLONOSCOPY    . HERNIA REPAIR     bilateral inguinal hernia  . HERNIA REPAIR     umbilical  . IR IMAGING GUIDED PORT INSERTION  09/16/2019  . JOINT REPLACEMENT  2009   right knee  . LUMBAR LAMINECTOMY/DECOMPRESSION MICRODISCECTOMY N/A 06/06/2017   Procedure: Decompression L3-5, insitu fusion L3-5 ;  Surgeon: Melina Schools, MD;  Location: [place];  Service: Orthopedics;  Laterality: N/A;  . LYMPH NODE BIOPSY Right 09/01/2019   Procedure: CERVICAL LYMPH NODE BIOPSY;  Surgeon: [name], MD;  Location: [place];  Service: ENT;  Laterality: Right;  . MIDDLE EAR SURGERY Right    ear -patched hole in ear drum  . TOTAL KNEE ARTHROPLASTY  09/10/2012   Procedure: TOTAL KNEE ARTHROPLASTY;  Surgeon: Tobi Bastos, MD;  Location: [facility];  Service: Orthopedics;  Laterality: Left;  . TOTAL SHOULDER ARTHROPLASTY Right 06/22/2016   Procedure: RIGHT TOTAL SHOULDER ARTHROPLASTY;  Surgeon: [name], MD;  Location: [place];  Service: Orthopedics;  Laterality: Right;     . aspirin EC  81 mg Oral Daily  . atorvastatin  40 mg Oral q1800  . carvedilol  12.5 mg Oral BID WC  . Chlorhexidine Gluconate Cloth  6 each Topical Daily  . insulin aspart  0-9 Units Subcutaneous TID WC  . potassium chloride  20 mEq Oral Daily     Physical Exam: Blood pressure 115/67, pulse 66, temperature 97.7 F (36.5 C), resp. rate 20, SpO2 100 %.    Affect appropriate Chronically ill bull neck male  HEENT: normal Right sided port a cath with cervical adenopathy  JVP normal no bruits no thyromegaly Lungs  clear with no wheezing and good diaphragmatic motion Heart:  S1/S2 no murmur, no rub, gallop or click PMI normal Abdomen: benighn, BS positve, no tenderness, no AAA no bruit.  No HSM or HJR Distal pulses intact with no bruits Plus one bilateral edema Neuro non-focal Skin warm and dry No muscular weakness   Labs:   Lab Results  Component Value Date  WBC 0.3 (LL) 10/09/2019   HGB 9.0 (L) 10/09/2019   HCT 27.1 (L) 10/09/2019   MCV 85.2 10/09/2019   PLT 98 (L) 10/09/2019    Recent Labs  Lab 10/08/19 2211 10/10/19 0500  NA 136 136  K 3.4* 3.4*  CL 104 102  CO2 24 20*  BUN 14 27*  CREATININE 1.09 1.82*  CALCIUM 7.8* 7.5*  PROT 6.2*  --   BILITOT 1.4*  --   ALKPHOS 69  --   ALT 14  --   AST 13*  --   GLUCOSE 138* 122*   No results found for: CKTOTAL, CKMB, CKMBINDEX, TROPONINI  Lab Results  Component Value Date   CHOL 167 10/21/2018   CHOL 150 10/04/2017   CHOL 172 10/02/2016   Lab Results  Component Value Date   HDL 40.20 10/21/2018   HDL 42.50 10/04/2017   HDL 40.60 10/02/2016   Lab Results  Component Value Date   LDLCALC 60 02/02/2014   LDLCALC 99 05/13/2008   LDLCALC 99 01/02/2008   Lab Results  Component Value Date   TRIG 315.0 (H) 10/21/2018   TRIG 316.0 (H) 10/04/2017   TRIG 305.0 (H) 10/02/2016   Lab Results  Component Value Date   CHOLHDL 4 10/21/2018   CHOLHDL 4 10/04/2017   CHOLHDL 4 10/02/2016   Lab Results  Component Value Date   LDLDIRECT 96.0 10/21/2018   LDLDIRECT 86.0 10/04/2017   LDLDIRECT 97.0 10/02/2016      Radiology: DG Chest 2 View  Result Date: 09/23/2019 CLINICAL DATA:  Productive cough and dyspnea for 4 weeks.  Lymphoma. EXAM: CHEST - 2 VIEW COMPARISON:  06/16/2019 chest radiograph. FINDINGS: Right internal jugular Port-A-Cath terminates in the middle third of the SVC. Stable cardiomediastinal silhouette with normal heart size. No pneumothorax. Small bilateral pleural effusions. No pulmonary edema. Mild bibasilar  atelectasis. IMPRESSION: Mild bibasilar atelectasis with small bilateral pleural effusions. Electronically Signed   By: Ilona Sorrel M.D.   On: 09/23/2019 13:45   PET, initial (skull base to thigh)  Result Date: 09/17/2019 CLINICAL DATA:  Initial treatment strategy for T-cell lymphoma/leukemia. EXAM: NUCLEAR MEDICINE PET SKULL BASE TO THIGH TECHNIQUE: 14.6 mCi F-18 FDG was injected intravenously. Full-ring PET imaging was performed from the skull base to thigh after the radiotracer. CT data was obtained and used for attenuation correction and anatomic localization. Fasting blood glucose: 147 mg/dl COMPARISON:  Chest and neck CTs of 08/25/2019. FINDINGS: Mediastinal blood pool activity: SUV max 3.1 Liver activity: SUV max 3.8 NECK: Bilateral hypermetabolic cervical adenopathy. An index right level 2 nodal conglomerate measures 5.4 x 3.0 cm and a S.U.V. max of 18.6 on 29/4. Incidental CT findings: Deferred to recent diagnostic CT. CHEST: Extensive hypermetabolic thoracic adenopathy. Subcarinal node measures 2.3 cm and a S.U.V. max of 19.2 on 78/4 Left axillary node measures 3.2 cm and a S.U.V. max of 18.1 on 69/4. Incidental CT findings: Deferred to recent diagnostic CT. New small bilateral pleural effusions. Right Port-A-Cath tip at low SVC. Aortic and coronary artery atherosclerosis. ABDOMEN/PELVIS: Extensive abdominal hypermetabolic adenopathy. An index left periaortic node measures 3.6 cm and a S.U.V. max of 20.3 on 129/4. Portal caval node measures 3.7 cm and a S.U.V. max of 22.9 on 120/4. An index right external iliac node measures 1.3 cm and a S.U.V. max of 19.2 on 171/4. Index left inguinal node measures 1.5 cm and a S.U.V. max of 15.3 on 192/4. No splenic hypermetabolism. Incidental CT findings: Small volume abdominopelvic ascites, new since the  prior diagnostic CT. Interpolar left renal 9.4 cm cyst. Abdominal aortic atherosclerosis. SKELETON: No abnormal marrow activity. Incidental CT findings: Left hip  osteoarthritis. Degenerative partial fusion of the bilateral sacroiliac joints. Right shoulder arthroplasty. IMPRESSION: 1. Active lymphoma within the neck, chest, abdomen, and pelvis, as detailed above. ([place]) 4 2. Development of small bilateral pleural effusions and abdominal ascites, possibly related to fluid overload. 3. Coronary artery atherosclerosis. Aortic Atherosclerosis (ICD10-I70.0). Electronically Signed   By: Abigail Miyamoto M.D.   On: 09/17/2019 10:23   CT-gudied biopsy  Result Date: 09/19/2019 INDICATION: T-cell lymphoma. Please perform CT-guided bone marrow biopsy for tissue diagnostic purposes. EXAM: CT-GUIDED BONE MARROW BIOPSY AND ASPIRATION MEDICATIONS: None ANESTHESIA/SEDATION: Fentanyl 100 mcg IV; Versed 2 mg IV Sedation Time: 10 Minutes; The patient was continuously monitored during the procedure by the interventional radiology nurse under my direct supervision. COMPLICATIONS: None immediate. PROCEDURE: Informed consent was obtained from the patient following an explanation of the procedure, risks, benefits and alternatives. The patient understands, agrees and consents for the procedure. All questions were addressed. A time out was performed prior to the initiation of the procedure. The patient was positioned prone and non-contrast localization CT was performed of the pelvis to demonstrate the iliac marrow spaces. The operative site was prepped and draped in the usual sterile fashion. Under sterile conditions and local anesthesia, a 22 gauge spinal needle was utilized for procedural planning. Next, an 11 gauge coaxial bone biopsy needle was advanced into the left iliac marrow space. Needle position was confirmed with CT imaging. Initially, bone marrow aspiration was performed. Next, a bone marrow biopsy was obtained with the 11 gauge outer bone marrow device. Samples were prepared with the cytotechnologist and deemed adequate. The needle was removed intact. Hemostasis was obtained with  compression and a dressing was placed. The patient tolerated the procedure well without immediate post procedural complication. IMPRESSION: Successful CT guided left iliac bone marrow aspiration and core biopsy. Electronically Signed   By: Sandi Mariscal M.D.   On: 09/19/2019 10:39   DG Chest Port 1 View  Result Date: 10/08/2019 CLINICAL DATA:  Cough, shortness of breath, weakness EXAM: PORTABLE CHEST 1 VIEW COMPARISON:  Radiograph 09/23/2019 FINDINGS: Globally increased opacity in the right hemithorax with indistinctness of the right lung base likely reflecting some layering pleural fluid on a background of more diffuse interstitial airspace opacities. Layering left pleural effusion is present as well. A right IJ approach Port-A-Cath tip terminates in the superior cavoatrial junction. Cardiomegaly is similar to priors. IMPRESSION: Patchy interstitial and airspace opacity throughout both lungs with layering bilateral effusions. Appearance favoring CHF/volume overload though infection could have a similar appearance. Electronically Signed   By: Lovena Le M.D.   On: 10/08/2019 22:45   Xray, abd (2 view)  Result Date: 10/02/2019 CLINICAL DATA:  Nausea, abdominal distension EXAM: ABDOMEN - 2 VIEW COMPARISON:  None. FINDINGS: Examination is limited secondary to poor penetration from patient body habitus. The bowel gas pattern is normal. There is no evidence of free air. No radio-opaque calculi or other significant radiographic abnormality is seen. IMPRESSION: Nonobstructive bowel gas pattern. Electronically Signed   By: Davina Poke M.D.   On: 10/02/2019 16:14   CT BONE MARROW BIOPSY & ASPIRATION  Result Date: 09/19/2019 INDICATION: T-cell lymphoma. Please perform CT-guided bone marrow biopsy for tissue diagnostic purposes. EXAM: CT-GUIDED BONE MARROW BIOPSY AND ASPIRATION MEDICATIONS: None ANESTHESIA/SEDATION: Fentanyl 100 mcg IV; Versed 2 mg IV Sedation Time: 10 Minutes; The patient was continuously  monitored during  the procedure by the interventional radiology nurse under my direct supervision. COMPLICATIONS: None immediate. PROCEDURE: Informed consent was obtained from the patient following an explanation of the procedure, risks, benefits and alternatives. The patient understands, agrees and consents for the procedure. All questions were addressed. A time out was performed prior to the initiation of the procedure. The patient was positioned prone and non-contrast localization CT was performed of the pelvis to demonstrate the iliac marrow spaces. The operative site was prepped and draped in the usual sterile fashion. Under sterile conditions and local anesthesia, a 22 gauge spinal needle was utilized for procedural planning. Next, an 11 gauge coaxial bone biopsy needle was advanced into the left iliac marrow space. Needle position was confirmed with CT imaging. Initially, bone marrow aspiration was performed. Next, a bone marrow biopsy was obtained with the 11 gauge outer bone marrow device. Samples were prepared with the cytotechnologist and deemed adequate. The needle was removed intact. Hemostasis was obtained with compression and a dressing was placed. The patient tolerated the procedure well without immediate post procedural complication. IMPRESSION: Successful CT guided left iliac bone marrow aspiration and core biopsy. Electronically Signed   By: Sandi Mariscal M.D.   On: 09/19/2019 10:39   ECHOCARDIOGRAM COMPLETE  Result Date: 10/09/2019   ECHOCARDIOGRAM REPORT   Patient Name:   [name] [name] Date of Exam: 10/09/2019 Medical Rec #:  [id number]     Height:       72.0 in Accession #:    [id number]    Weight:       304.5 lb Date of Birth:  12-26-1940     BSA:          2.55 m Patient Age:    22 years      BP:           131/81 mmHg Patient Gender: M             HR:           85 bpm. Exam Location:  Inpatient Procedure: 2D Echo, Cardiac Doppler, Color Doppler and Intracardiac            Opacification Agent  Indications:    Congestive Heart Failure 428.0  History:        Patient has prior history of Echocardiogram examinations, most                 recent 09/12/2019. CHF, Arrythmias:PVC; Risk                 Factors:Hypertension, Diabetes, Dyslipidemia and Former Smoker.                 Elevated troponin. GERD. Palpitations.  Sonographer:    Paulita Fujita RDCS Referring Phys: [id number] [place]  1. Left ventricular ejection fraction, by visual estimation, is 45 to 50%. The left ventricle has mildly decreased function. There is no left ventricular hypertrophy.  2. Mild hypokinesis of the left ventricular, mid-apical anterior wall and apical segment.  3. Definity contrast agent was given IV to delineate the left ventricular endocardial borders.  4. Left ventricular diastolic parameters are consistent with Grade I diastolic dysfunction (impaired relaxation).  5. Mild to moderately dilated left ventricular internal cavity size.  6. The left ventricle demonstrates regional wall motion abnormalities.  7. Global right ventricle has low normal systolic function.The right ventricular size is normal. No increase in right ventricular wall thickness.  8. Left atrial size was normal.  9. Right atrial size  was normal. 10. The mitral valve is grossly normal. Trivial mitral valve regurgitation. 11. The tricuspid valve is grossly normal. Tricuspid valve regurgitation is mild. 12. The aortic valve is tricuspid. Aortic valve regurgitation is trivial. Mild aortic valve sclerosis without stenosis. 13. The pulmonic valve was grossly normal. Pulmonic valve regurgitation is not visualized. 14. Aortic dilatation noted. 15. 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 16. Mildly elevated pulmonary artery systolic pressure. 17. The inferior vena cava is dilated in size with >50% respiratory variability, suggesting right atrial pressure of 8 mmHg. FINDINGS  Left Ventricle: Left ventricular ejection fraction, by  visual estimation, is 45 to 50%. The left ventricle has mildly decreased function. Mild hypokinesis of the left ventricular, mid-apical anterior wall and apical segment. Definity contrast agent was given IV to delineate the left ventricular endocardial borders. The left ventricle demonstrates regional wall motion abnormalities. The left ventricular internal cavity size was mildly to moderately dilated left ventricle. There is no left ventricular hypertrophy. Left ventricular diastolic parameters are consistent with Grade I diastolic dysfunction (impaired relaxation). Indeterminate filling pressures. Right Ventricle: The right ventricular size is normal. No increase in right ventricular wall thickness. Global RV systolic function is has low normal systolic function. The tricuspid regurgitant velocity is 2.62 m/s, and with an assumed right atrial pressure of 8 mmHg, the estimated right ventricular systolic pressure is mildly elevated at 35.5 mmHg. Left Atrium: Left atrial size was normal in size. Right Atrium: Right atrial size was normal in size Pericardium: There is no evidence of pericardial effusion. Mitral Valve: The mitral valve is grossly normal. Trivial mitral valve regurgitation. Tricuspid Valve: The tricuspid valve is grossly normal. Tricuspid valve regurgitation is mild. Aortic Valve: The aortic valve is tricuspid. Aortic valve regurgitation is trivial. Mild aortic valve sclerosis is present, with no evidence of aortic valve stenosis. Pulmonic Valve: The pulmonic valve was grossly normal. Pulmonic valve regurgitation is not visualized. Pulmonic regurgitation is not visualized. Aorta: Aortic dilatation noted. 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 Venous: The inferior vena cava is dilated in size with greater than 50% respiratory variability, suggesting right atrial pressure of 8 mmHg. IAS/Shunts: No atrial level shunt detected by color flow Doppler.  LEFT VENTRICLE PLAX 2D LVIDd:          6.30 cm       Diastology LVIDs:         4.20 cm       LV e' lateral:   10.30 cm/s LV PW:         1.00 cm       LV E/e' lateral: 6.3 LV IVS:        1.00 cm       LV e' medial:    7.18 cm/s LVOT diam:     2.20 cm       LV E/e' medial:  9.1 LV SV:         123 ml LV SV Index:   45.27 LVOT Area:     3.80 cm  LV Volumes (MOD) LV area d, A2C:    50.30 cm LV area d, A4C:    50.10 cm LV area s, A2C:    33.40 cm LV area s, A4C:    30.20 cm LV major d, A2C:   10.20 cm LV major d, A4C:   9.67 cm LV major s, A2C:   8.58 cm LV major s, A4C:   7.69 cm LV vol d, MOD A2C:  208.0 ml LV vol d, MOD A4C: 214.0 ml LV vol s, MOD A2C: 111.0 ml LV vol s, MOD A4C: 98.8 ml LV SV MOD A2C:     97.0 ml LV SV MOD A4C:     214.0 ml LV SV MOD BP:      106.7 ml RIGHT VENTRICLE RV S prime:     12.90 cm/s TAPSE (M-mode): 2.2 cm LEFT ATRIUM             Index       RIGHT ATRIUM           Index LA diam:        3.70 cm 1.45 cm/m  RA Area:     15.60 cm LA Vol (A2C):   77.8 ml 30.54 ml/m RA Volume:   35.20 ml  13.82 ml/m LA Vol (A4C):   71.1 ml 27.91 ml/m LA Biplane Vol: 76.7 ml 30.11 ml/m  AORTIC VALVE LVOT Vmax:   131.00 cm/s LVOT Vmean:  91.400 cm/s LVOT VTI:    0.238 m  AORTA Ao Root diam: 4.00 cm MITRAL VALVE                        TRICUSPID VALVE MV Area (PHT): 3.42 cm             TR Peak grad:   27.5 mmHg MV PHT:        64.38 msec           TR Vmax:        262.00 cm/s MV Decel Time: 222 msec MV E velocity: 65.10 cm/s 103 cm/s  SHUNTS MV A velocity: 50.60 cm/s 70.3 cm/s Systemic VTI:  0.24 m MV E/A ratio:  1.29       1.5       Systemic Diam: 2.20 cm  Lyman Bishop MD Electronically signed by Lyman Bishop MD Signature Date/Time: 10/09/2019/4:09:07 PM    Final    ECHOCARDIOGRAM COMPLETE  Result Date: 09/12/2019   ECHOCARDIOGRAM REPORT   Patient Name:   [name] [name] Date of Exam: 09/12/2019 Medical Rec #:  [id number]     Height:       72.0 in Accession #:    [id number]    Weight:       290.7 lb Date of Birth:  1941-06-05     BSA:           2.50 m Patient Age:    38 years      BP:           131/62 mmHg Patient Gender: M             HR:           81 bpm. Exam Location:  [place] Procedure: 2D Echo, Cardiac Doppler, Color Doppler, Strain Analysis and            Intracardiac Opacification Agent Indications:    Chemo  History:        Patient has prior history of Echocardiogram examinations, most                 recent 05/29/2013. Arrythmias:PVC; Risk Factors:Hypertension,                 Diabetes and Dyslipidemia.  Sonographer:    Cardell Peach RDCS (AE) Referring Phys: [id number] [facility]  Sonographer Comments: Technically challenging study due to limited acoustic windows. IMPRESSIONS  1. Left ventricular ejection fraction, by visual estimation, is  60 to 65%. The left ventricle has normal function. There is moderately increased left ventricular hypertrophy.  2. Definity contrast agent was given IV to delineate the left ventricular endocardial borders.  3. Left ventricular diastolic parameters are consistent with Grade I diastolic dysfunction (impaired relaxation).  4. The tricuspid valve is normal in structure. Tricuspid valve regurgitation is trivial.  5. The aortic valve is normal in structure. Aortic valve regurgitation is trivial. No evidence of aortic valve sclerosis or stenosis.  6. There is mild dilatation of the ascending aorta measuring 39 mm. FINDINGS  Left Ventricle: Left ventricular ejection fraction, by visual estimation, is 60 to 65%. The left ventricle has normal function. Definity contrast agent was given IV to delineate the left ventricular endocardial borders. There is moderately increased left ventricular hypertrophy. Left ventricular diastolic parameters are consistent with Grade I diastolic dysfunction (impaired relaxation). Normal left atrial pressure. Right Ventricle: The right ventricular size is normal. No increase in right ventricular wall thickness. Global RV systolic function is has normal systolic function. The tricuspid  regurgitant velocity is 2.65 m/s, and with an assumed right atrial pressure  of 3 mmHg, the estimated right ventricular systolic pressure is mildly elevated at 31.1 mmHg. Left Atrium: Left atrial size was normal in size. Right Atrium: Right atrial size was normal in size Pericardium: There is no evidence of pericardial effusion. Mitral Valve: The mitral valve is normal in structure. No evidence of mitral valve stenosis by observation. No evidence of mitral valve regurgitation. Tricuspid Valve: The tricuspid valve is normal in structure. Tricuspid valve regurgitation is trivial. Aortic Valve: The aortic valve is normal in structure. Aortic valve regurgitation is trivial. Aortic regurgitation PHT measures 425 msec. The aortic valve is structurally normal, with no evidence of sclerosis or stenosis. Pulmonic Valve: The pulmonic valve was normal in structure. Pulmonic valve regurgitation is not visualized. Aorta: The aortic root, ascending aorta and aortic arch are all structurally normal, with no evidence of dilitation or obstruction. There is mild dilatation of the ascending aorta measuring 39 mm. Venous: The inferior vena cava is normal in size with greater than 50% respiratory variability, suggesting right atrial pressure of 3 mmHg. IAS/Shunts: No atrial level shunt detected by color flow Doppler. No ventricular septal defect is seen or detected. There is no evidence of an atrial septal defect.  LEFT VENTRICLE PLAX 2D LVIDd:         5.06 cm  Diastology LVIDs:         2.90 cm  LV e' lateral:   7.72 cm/s LV PW:         1.38 cm  LV E/e' lateral: 7.0 LV IVS:        1.51 cm  LV e' medial:    9.79 cm/s LVOT diam:     2.00 cm  LV E/e' medial:  5.6 LV SV:         89 ml LV SV Index:   33.82 LVOT Area:     3.14 cm  RIGHT VENTRICLE             IVC RV Basal diam:  2.28 cm     IVC diam: 1.57 cm RV S prime:     19.50 cm/s TAPSE (M-mode): 1.9 cm LEFT ATRIUM             Index       RIGHT ATRIUM           Index LA diam:        4.00  cm 1.60 cm/m  RA Area:     14.00 cm LA Vol (A2C):   40.9 ml 16.37 ml/m RA Volume:   29.60 ml  11.85 ml/m LA Vol (A4C):   49.3 ml 19.74 ml/m LA Biplane Vol: 46.3 ml 18.54 ml/m  AORTIC VALVE LVOT Vmax:   103.00 cm/s LVOT Vmean:  81.800 cm/s LVOT VTI:    0.191 m AI PHT:      425 msec  AORTA Ao Root diam: 3.80 cm Ao Asc diam:  3.90 cm MITRAL VALVE                        TRICUSPID VALVE MV Area (PHT): 2.59 cm             TR Peak grad:   28.1 mmHg MV PHT:        84.97 msec           TR Vmax:        265.00 cm/s MV Decel Time: 293 msec MV E velocity: 54.40 cm/s 103 cm/s  SHUNTS MV A velocity: 81.90 cm/s 70.3 cm/s Systemic VTI:  0.19 m MV E/A ratio:  0.66       1.5       Systemic Diam: 2.00 cm  Jyl Heinz MD Electronically signed by Jyl Heinz MD Signature Date/Time: 09/12/2019/6:00:10 PM    Final    IR port placement [facility])  Result Date: 09/16/2019 CLINICAL DATA:  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 EXAM: IMPLANTED PORT A CATH PLACEMENT WITH ULTRASOUND AND FLUOROSCOPIC GUIDANCE ANESTHESIA/SEDATION: 3.0 mg IV Versed; 100 mcg IV Fentanyl Total Moderate Sedation Time:  30 minutes The patient's level of consciousness and physiologic status were continuously monitored during the procedure by Radiology nursing. Additional Medications: 2 g IV Ancef. FLUOROSCOPY TIME:  30 seconds.  25.8 mGy. PROCEDURE: The procedure, risks, benefits, and alternatives were explained to the patient. Questions regarding the procedure were encouraged and answered. The patient understands and consents to the procedure. A time-out was performed prior to initiating the procedure. Ultrasound was utilized to confirm patency of the right internal jugular vein. The right neck and chest were prepped with chlorhexidine in a sterile fashion, and a sterile drape was applied covering the operative field. Maximum barrier sterile technique with sterile gowns and gloves were used for the procedure. Local anesthesia was  provided with 1% lidocaine. After creating a small venotomy incision, a 21 gauge needle was advanced into the right internal jugular vein under direct, real-time ultrasound guidance. Ultrasound image documentation was performed. After securing guidewire access, an 8 Fr dilator was placed. A J-wire was kinked to measure appropriate catheter length. A subcutaneous port pocket was then created along the upper chest wall utilizing sharp and blunt dissection. Portable cautery was utilized. The pocket was irrigated with sterile saline. A single lumen power injectable port was chosen for placement. The 8 Fr catheter was tunneled from the port pocket site to the venotomy incision. The port was placed in the pocket. External catheter was trimmed to appropriate length based on guidewire measurement. At the venotomy, an 8 Fr peel-away sheath was placed over a guidewire. The catheter was then placed through the sheath and the sheath removed. Final catheter positioning was confirmed and documented with a fluoroscopic spot image. The port was accessed with a needle and aspirated and flushed with heparinized saline. The access needle was removed. The venotomy and port pocket incisions were closed with subcutaneous 3-0 Monocryl  and subcuticular 4-0 Vicryl. Dermabond was applied to both incisions. COMPLICATIONS: COMPLICATIONS None FINDINGS: After catheter placement, the tip lies at the cavo-atrial junction. The catheter aspirates normally and is ready for immediate use. IMPRESSION: Placement of single lumen port a cath via right internal jugular vein. The catheter tip lies at the cavo-atrial junction. A power injectable port a cath was placed and is ready for immediate use. Electronically Signed   By: Aletta Edouard M.D.   On: 09/16/2019 15:51    EKG: NSR low voltage non specific ST changes    ASSESSMENT AND PLAN:   1. 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 Toxicity with monoclonal antibody is More reversible If possible would be ideal not to use either agent going forward Continue coreg. PRN lasix to keep I/O's slightly negative Would start low dose ACE over weekend if Cr falls below 1.5  Will need ischemic evaluation as outpatient but doubt acute ischemic event. With no chest pain or ST changes on ECG. 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 HLD:  Continue statin   4. DM:  Cover with SS insulin   Signed: Jenkins Rouge 10/10/2019, 3:23 PM

## 2019-10-10 NOTE — ED Notes (Signed)
Pt was changed, repositioned, peri-care provided by EMT's.

## 2019-10-10 NOTE — Evaluation (Signed)
Physical Therapy Evaluation Patient Details Name: [name] [name] MRN: [id number] DOB: Mar 17, 1941 Today's Date: 10/10/2019   History of Present Illness  78 yo male admitted to ED on 12/16 with progress weakness, diarrhea, syncopal episode, and respiratory failure. 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 PMH includes OA with history of bilateral TKR and R shoulder arthroplasty, DM, GERD, HTN, lumbar fusion, myasthenia gravis L eye.    Clinical Impression  Pt presents with generalized weakness, difficulty performing bed mobility, increased time and effort to mobilize, and decreased activity tolerance. Pt to benefit from acute PT to address deficits. Pt ambulated short room distance with RW with min guard assist, with sats 94% on RA at lowest, PT reapplied 2LO2 for recovery as pt with increased work of breathing. PT recommends HHPT at this time to address mobility deficits and maximize function post-acutely, as pt states his wife can provide "a little" assistance but will not be able to physically assist him if needed. PT to progress mobility as tolerated, and will continue to follow acutely.      Follow Up Recommendations Supervision for mobility/OOB;Home health PT    Equipment Recommendations  None recommended by PT    Recommendations for Other Services       Precautions / Restrictions Precautions Precautions: Fall Restrictions Weight Bearing Restrictions: No      Mobility  Bed Mobility Overal bed mobility: Needs Assistance Bed Mobility: Supine to Sit;Rolling Rolling: Supervision   Supine to sit: HOB elevated;Mod assist     General bed mobility comments: Mod assist for trunk elevation off of bed with use of HHA and trunk facilitation. Increased time and effort. supervision for rolling for removal of bed pan.  Transfers Overall transfer level: Needs assistance Equipment used: Rolling walker (2 wheeled) Transfers: Sit to/from Stand Sit to Stand: Min guard          General transfer comment: Min guard for safety, increased time to rise and steady.  Ambulation/Gait Ambulation/Gait assistance: Min guard Gait Distance (Feet): 10 Feet Assistive device: Rolling walker (2 wheeled) Gait Pattern/deviations: Step-through pattern;Decreased stride length;Trunk flexed Gait velocity: decr   General Gait Details: Min guard for safety, no unsteadiness or LOB noted with use of RW.  Stairs            Wheelchair Mobility    Modified Rankin (Stroke Patients Only)       Balance Overall balance assessment: Mild deficits observed, not formally tested                                           Pertinent Vitals/Pain Pain Assessment: 0-10 Pain Score: 2  Pain Location: abdomen Pain Descriptors / Indicators: Sore Pain Intervention(s): Limited activity within patient's tolerance;Monitored during session;Premedicated before session;Repositioned    Home Living Family/patient expects to be discharged to:: Private residence Living Arrangements: Spouse/significant other Available Help at Discharge: Family;Available 24 hours/day("a little bit") Type of Home: House Home Access: Stairs to enter Entrance Stairs-Rails: Right;Left;Can reach both Entrance Stairs-Number of Steps: 3 Home Layout: One level Home Equipment: Walker - 2 wheels;Cane - quad;Cane - single point;Toilet riser;Grab bars - toilet      Prior Function Level of Independence: Independent with assistive device(s)         Comments: used cane for ambulation PTA. No falls in the past year     Hand Dominance   Dominant Hand: Right  Extremity/Trunk Assessment   Upper Extremity Assessment Upper Extremity Assessment: Defer to OT evaluation    Lower Extremity Assessment Lower Extremity Assessment: Generalized weakness    Cervical / Trunk Assessment Cervical / Trunk Assessment: Normal  Communication   Communication: No difficulties  Cognition  Arousal/Alertness: Awake/alert Behavior During Therapy: WFL for tasks assessed/performed Overall Cognitive Status: Within Functional Limits for tasks assessed                                        General Comments      Exercises     Assessment/Plan    PT Assessment Patient needs continued PT services  PT Problem List Decreased strength;Decreased mobility;Decreased activity tolerance;Decreased knowledge of use of DME;Pain       PT Treatment Interventions DME instruction;Therapeutic activities;Gait training;Therapeutic exercise;Patient/family education;Stair training;Balance training;Functional mobility training    PT Goals (Current goals can be found in the Care Plan section)  Acute Rehab PT Goals Patient Stated Goal: stop belly pain PT Goal Formulation: With patient Time For Goal Achievement: 10/24/19 Potential to Achieve Goals: Good    Frequency Min 3X/week   Barriers to discharge        Co-evaluation               AM-PAC PT "6 Clicks" Mobility  Outcome Measure Help needed turning from your back to your side while in a flat bed without using bedrails?: A Little Help needed moving from lying on your back to sitting on the side of a flat bed without using bedrails?: A Lot Help needed moving to and from a bed to a chair (including a wheelchair)?: A Little Help needed standing up from a chair using your arms (e.g., wheelchair or bedside chair)?: A Little Help needed to walk in hospital room?: A Little Help needed climbing 3-5 steps with a railing? : A Little 6 Click Score: 17    End of Session Equipment Utilized During Treatment: Gait belt Activity Tolerance: Patient tolerated treatment well;Patient limited by fatigue;Patient limited by pain Patient left: in chair;with call bell/phone within reach;with nursing/sitter in room Nurse Communication: Mobility status PT Visit Diagnosis: Difficulty in walking, not elsewhere classified (R26.2);Other  abnormalities of gait and mobility (R26.89)    Time: [id number] PT Time Calculation (min) (ACUTE ONLY): 19 min   Charges:   PT Evaluation $PT Eval Low Complexity: 1 Low          Isobelle Tuckett E, PT Acute Rehabilitation Services Pager [phone]  Office [phone]   Afra Tricarico D Elonda Husky 10/10/2019, 2:49 PM

## 2019-10-11 ENCOUNTER — Encounter (HOSPITAL_COMMUNITY): Payer: Self-pay | Admitting: Internal Medicine

## 2019-10-11 DIAGNOSIS — J9601 Acute respiratory failure with hypoxia: Secondary | ICD-10-CM

## 2019-10-11 DIAGNOSIS — N179 Acute kidney failure, unspecified: Secondary | ICD-10-CM

## 2019-10-11 LAB — BASIC METABOLIC PANEL
Anion gap: 11 (ref 5–15)
BUN: 38 mg/dL — ABNORMAL HIGH (ref 8–23)
CO2: 23 mmol/L (ref 22–32)
Calcium: 7.6 mg/dL — ABNORMAL LOW (ref 8.9–10.3)
Chloride: 101 mmol/L (ref 98–111)
Creatinine, Ser: 1.72 mg/dL — ABNORMAL HIGH (ref 0.61–1.24)
GFR calc Af Amer: 43 mL/min — ABNORMAL LOW (ref >60)
GFR calc non Af Amer: 38 mL/min — ABNORMAL LOW (ref >60)
Glucose, Bld: 119 mg/dL — ABNORMAL HIGH (ref 70–99)
Potassium: 3.3 mmol/L — ABNORMAL LOW (ref 3.5–5.1)
Sodium: 135 mmol/L (ref 135–145)

## 2019-10-11 LAB — GLUCOSE, CAPILLARY
Glucose-Capillary: 103 mg/dL — ABNORMAL HIGH (ref 70–99)
Glucose-Capillary: 101 mg/dL — ABNORMAL HIGH (ref 70–99)
Glucose-Capillary: 120 mg/dL — ABNORMAL HIGH (ref 70–99)

## 2019-10-11 LAB — MAGNESIUM: Magnesium: 2.2 mg/dL (ref 1.7–2.4)

## 2019-10-11 MED ORDER — POTASSIUM CHLORIDE 20 MEQ PO PACK
20.0000 meq | PACK | Freq: Once | ORAL | Status: AC
  Administered 2019-10-11: 20 meq via ORAL
  Filled 2019-10-11: qty 1

## 2019-10-11 NOTE — Progress Notes (Signed)
Occupational Therapy Evaluation Patient Details Name: [name] [name] MRN: [id number] DOB: [birth year] Today's Date: 10/11/2019    History of Present Illness 78 yo male admitted to ED on 12/16 with progress weakness, diarrhea, syncopal episode, and respiratory failure. 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 PMH includes OA with history of bilateral TKR and R shoulder arthroplasty, DM, GERD, HTN, lumbar fusion, myasthenia gravis L eye.   Clinical Impression   PTA, pt was living at home with his wife, and reports he was independent with ADL/IADL and functional mobility with use of spc. Pt currently requires supervision for stand-pivot toilet transfer, minguard-minA for in room mobility at RW level, modA for LB dressing. At start of session pt stated "I don't know how much longer I will make it", pt openly discussing thoughts about his prognosis and reminiscing on his life. Discussed recommendation for palliative consult, pt agreeable to consult. RN notified. Due to decline in current level of function, pt would benefit from acute OT to address established goals to facilitate safe D/C to venue listed below. At this time, recommend HHOT follow-up. Will continue to follow acutely.     Follow Up Recommendations  Home health OT;Supervision - Intermittent    Equipment Recommendations  None recommended by OT    Recommendations for Other Services (Palliative)     Precautions / Restrictions Precautions Precautions: Fall Restrictions Weight Bearing Restrictions: No      Mobility Bed Mobility Overal bed mobility: Needs Assistance Bed Mobility: Supine to Sit;Sit to Supine     Supine to sit: Min assist Sit to supine: Supervision   General bed mobility comments: minA to progress trunk to upright posture  Transfers Overall transfer level: Needs assistance Equipment used: Rolling walker (2 wheeled) Transfers: Sit to/from [facility] Sit to Stand:  Supervision Stand pivot transfers: Supervision       General transfer comment: supervision for safety    Balance Overall balance assessment: Mild deficits observed, not formally tested                                         ADL either performed or assessed with clinical judgement   ADL Overall ADL's : Needs assistance/impaired Eating/Feeding: Set up;Sitting   Grooming: Min guard;Standing;Minimal assistance   Upper Body Bathing: Min guard;Sitting   Lower Body Bathing: Moderate assistance Lower Body Bathing Details (indicate cue type and reason): modA to access feet;pt reports he used AE  Upper Body Dressing : Min guard;Sitting   Lower Body Dressing: Moderate assistance Lower Body Dressing Details (indicate cue type and reason): modA to access feet, pt reports he uses AE at home Toilet Transfer: Supervision/safety;Stand-pivot Toilet Transfer Details (indicate cue type and reason): pt required minguard-mina for ambulation;required supervision for stand-pivot to BSC;pt reports urgency for bowel movement is abrupt and he has been ambulating to the bathroom against RN orders;observed pt stand-pivot 3x with supervision level, discussed pt appropriate for this transfer with RN Toileting- Clothing Manipulation and Hygiene: Supervision/safety;Sitting/lateral lean       Functional mobility during ADLs: Min guard;Rolling walker General ADL Comments: minguard with RW for safety;pt reported dizziness with initial sitting edge of bed, good safety awareness to pause before moving     Vision         Perception     Praxis      Pertinent Vitals/Pain Pain Assessment: No/denies pain Pain Intervention(s): Limited  activity within patient's tolerance;Monitored during session     Hand Dominance Right   Extremity/Trunk Assessment Upper Extremity Assessment Upper Extremity Assessment: Generalized weakness   Lower Extremity Assessment Lower Extremity Assessment: Defer  to PT evaluation   Cervical / Trunk Assessment Cervical / Trunk Assessment: Normal   Communication Communication Communication: No difficulties   Cognition Arousal/Alertness: Awake/alert Behavior During Therapy: WFL for tasks assessed/performed Overall Cognitive Status: Within Functional Limits for tasks assessed                                 General Comments: pt openly discussing feelings regarding dx, plainly stated "I don't think I will make it much longer", pt openly reminiscing on his life events and became appropriately emotional when discussing memories of family;pt reports he is "not depressed";discussed recommendation for Palliative, pt agrees this would be appropriate. RN notified.    General Comments  vss throughout; SpO2 99% RA ;HR 80s throughout session    Exercises     Shoulder Instructions      Home Living Family/patient expects to be discharged to:: Private residence Living Arrangements: Spouse/significant other Available Help at Discharge: Family;Available 24 hours/day("a little bit") Type of Home: House Home Access: Stairs to enter [employer] of Steps: 3 Entrance Stairs-Rails: Right;Left;Can reach both Home Layout: One level     Bathroom Shower/Tub: Occupational psychologist: Standard Bathroom Accessibility: Yes   Home Equipment: Environmental consultant - 2 wheels;Cane - quad;Cane - single point;Toilet riser;Grab bars - toilet          Prior Functioning/Environment Level of Independence: Independent with assistive device(s)        Comments: used cane for ambulation PTA. No falls in the past year        OT Problem List: Decreased strength;Decreased activity tolerance;Impaired balance (sitting and/or standing);Decreased safety awareness;Decreased knowledge of use of DME or AE;Decreased knowledge of precautions;Cardiopulmonary status limiting activity      OT Treatment/Interventions: Self-care/ADL training;Therapeutic  exercise;Energy conservation;DME and/or AE instruction;Therapeutic activities;Patient/family education;Balance training    OT Goals(Current goals can be found in the care plan section) Acute Rehab OT Goals Patient Stated Goal: to meet with palliative OT Goal Formulation: With patient Time For Goal Achievement: 10/25/19 Potential to Achieve Goals: Good ADL Goals Pt Will Perform Grooming: with modified independence;standing Pt Will Perform Lower Body Dressing: with modified independence;sit to/from stand;with adaptive equipment Pt Will Transfer to Toilet: with modified independence;ambulating Additional ADL Goal #1: Pt will demonstrate independence with 3 energy conservation strategies during ADL completion.  OT Frequency: Min 2X/week   Barriers to D/C: Decreased caregiver support  pt's wife unable to physically assist       Co-evaluation              AM-PAC OT "6 Clicks" Daily Activity     Outcome Measure Help from another person eating meals?: A Little Help from another person taking care of personal grooming?: A Little Help from another person toileting, which includes using toliet, bedpan, or urinal?: A Little Help from another person bathing (including washing, rinsing, drying)?: A Little Help from another person to put on and taking off regular upper body clothing?: A Little Help from another person to put on and taking off regular lower body clothing?: A Lot 6 Click Score: 17   End of Session Equipment Utilized During Treatment: Gait belt;Rolling walker Nurse Communication: Mobility status(level of functioning to stand pivot to [facility])  Activity  Tolerance: Patient tolerated treatment well Patient left: in bed;with call bell/phone within reach  OT Visit Diagnosis: Unsteadiness on feet (R26.81);Other abnormalities of gait and mobility (R26.89);Muscle weakness (generalized) (M62.81)                Time: [id number] OT Time Calculation (min): 48 min Charges:  OT General  Charges $OT Visit: 1 Visit OT Evaluation $OT Eval Moderate Complexity: 1 Mod OT Treatments $Self Care/Home Management : 8-22 mins $Cognitive Funtion inital: Initial 15 mins  Dorinda Hill OTR/L Acute Rehabilitation Services Office: [place] 10/11/2019, 4:38 PM

## 2019-10-11 NOTE — Progress Notes (Signed)
PROGRESS NOTE  [name] [name]  DOB: 1942-07-77  PCP: Colon Branch, MD LGX:[id number]  DOA: 10/08/2019  LOS: 2 days   Chief Complaint  Patient presents with  . Weakness   Brief narrative: [name] [name] is a 78 y.o. 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  Patient reports about 10 months of progressive shortness of breath.  He was diagnosed with stage IV angioimmunoblastic T-cell lymphoma in October 2020.  He had an echocardiogram on 09/12/2019 which showed an EF of 60-65% with grade 1 diastolic dysfunction.  He completed cycle 1 of chemotherapy with doxorubicin, Cytoxan, Adcetris on 09/29/2019. Since starting chemotherapy he has developed chemotherapy associated pancytopenia.  He has not had any obvious bleeding.  He has required occasional fluid boluses due to acute kidney injury. In the ED, patient required 2 to 3 L oxygen via nasal cannula to maintain O2 sat. Labs notable for WBC 0.3, hemoglobin 8.9, platelets 83,000, sodium 136, potassium 3.4, bicarb 24, BUN 14, creatinine 1.09, AST 13, ALT 14, alk phos 69, total bilirubin 1.4, high-sensitivity troponin I 869, BNP 1106.6. SARS-CoV-2 PCR test negative as well. Portable chest x-ray showed right Port-A-Cath in place and patchy interstitial and airspace opacities throughout both lungs with bilateral effusions.  EKG showed sinus tachycardia, first-degree AV block, T wave flattening throughout, and low voltage. Hospitalist service was consulted to admit for further evaluation and management.  Subjective: No shortness of breath. Patient continues to have chronic diarrhea  Assessment/Plan: Acute respiratory failure with hypoxia  Acute congestive heart  failure: -Presented with orthopnea, PND, pedal edema, volume overload state -BNP is 1106.6.   -Chest x-ray showed pulmonary edema.  -Echocardiogram 09/12/2019 showed EF 60-65% with grade 1 diastolic dysfunction prior to beginning chemotherapy. -Repeat echocardiogram on 12/7 showed a drop in EF to 45 to 50% with mild hypokinesis of the left ventricle, grade 1 diastolic dysfunction. -Currently on IV Lasix 60 mg twice daily -Creatinine worsened from 1.09 yesterday to 1.82 today.  Held Lasix this morning. Continue Coreg.  Hold losartan. -Continue to monitor in telemetry. -Monitor strict I/O's, daily weights -Continue supplemental oxygen as needed. 10/11/2019: Stable.  Elevated troponin: New drop in ejection fraction High-sensitivity troponin I 869. Suspect demand ischemia from above, continue to trend. Continue aspirin 81 mg for now, continue to monitor platelets. -With new drop in EF, I will cardiology consultation. 10/11/2019: Cardiology input is appreciated.  For further work-up on outpatient basis.  Chronic diarrhea: -Patient apparently has had multiple episodes of diarrhea since presentation. -C. difficile negative.  GI pathogen panel sent. -May benefit from a Flexi-Seal.  10/11/2019: Hopefully, this not related to chemotherapy.  Acute kidney injury -Baseline creatinine less than 1.  Creatinine elevated to 1.82 today.  The problem is likely because of dehydration from diarrhea as well as Lasix. -Hold IV Lasix. 10/11/2019: Acute kidney injury continues to improve.  Continue to monitor renal function and electrolytes.  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 Maylon Peppers.  Completed cycle 1 of doxorubicin, Cytoxan, and Adcetris on 09/29/2019.  He is afebrile and without any obvious bleeding.  Leukopenia worsened from prior with relatively stable hemoglobin and platelet count.  Continue to monitor  CBC.  Hypokalemia/hypomagnesemia -Potassium and magnesium low likely because of diarrhea.  Replaced.  10/11/2019: Continue to monitor and replete.  Hypertension: Continue Coreg.  Losartan and Lasix as above.  Type 2 diabetes: On Metformin as an outpatient.  A1c is 6.1.  Will use sensitive SSI while in hospital.  Hyperlipidemia: Continue statin.  Mobility: PT eval Diet: Cardiac diet Fluid: Not on IV fluids DVT prophylaxis:  SCDs Code Status:  DNR Family Communication:  Not at bedside Expected Discharge:  Continue inpatient care  Consultants:  None  Procedures:  None  Antimicrobials: Anti-infectives (From admission, onward)   None        Code Status: DNR   Diet Order            Diet heart healthy/carb modified Room service appropriate? Yes; Fluid consistency: Thin; Fluid restriction: 1500 mL Fluid  Diet effective now              Infusions:    Scheduled Meds: . aspirin EC  81 mg Oral Daily  . atorvastatin  40 mg Oral q1800  . carvedilol  12.5 mg Oral BID WC  . Chlorhexidine Gluconate Cloth  6 each Topical Daily  . insulin aspart  0-9 Units Subcutaneous TID WC  . potassium chloride  20 mEq Oral Daily    PRN meds: acetaminophen, ondansetron (ZOFRAN) IV   Objective: Vitals:   10/11/19 0359 10/11/19 0846  BP: 112/67 134/68  Pulse: (!) 58 64  Resp:  18  Temp: (!) 97.5 F (36.4 C)   SpO2: 98% 98%    Intake/Output Summary (Last 24 hours) at 10/11/2019 1516 Last data filed at 10/11/2019 0850 Gross per 24 hour  Intake 720 ml  Output 500 ml  Net 220 ml   Filed Weights   10/10/19 1840 10/11/19 0348  Weight: 125.7 kg 126.4 kg   Weight change:  Body mass index is 37.8 kg/m.   Physical Exam: General exam: Appears calm and comfortable.  HEENT: Mild pallor, no jaundice. Lungs: Decreased air entry with end inspiratory wheeze. CVS: S1-S2. GI/Abd obese, soft, nontender, organs are difficult to assess. CNS: Alert, awake, oriented  x3 Extremities: Mild leg edema.  Data Review: I have personally reviewed the laboratory data and studies available.  Recent Labs  Lab 10/06/19 0855 10/08/19 2211 10/09/19 0456  WBC 2.5* 0.3* 0.3*  NEUTROABS 1.6* 0.1* 0.1*  HGB 9.5* 8.9* 9.0*  HCT 28.2* 27.4* 27.1*  MCV 83.7 85.4 85.2  PLT 89* 83* 98*   Recent Labs  Lab 10/06/19 0855 10/08/19 2211 10/09/19 0456 10/10/19 0500 10/11/19 0527  NA 138 136  --  136 135  K 4.0 3.4*  --  3.4* 3.3*  CL 103 104  --  102 101  CO2 26 24  --  20* 23  GLUCOSE 116* 138*  --  122* 119*  BUN 28* 14  --  27* 38*  CREATININE 0.83 1.09  --  1.82* 1.72*  CALCIUM 8.6* 7.8*  --  7.5* 7.6*  MG  --   --  1.6*  --   --     Bonnell Public, MD  [facility] 10/11/2019

## 2019-10-11 NOTE — Progress Notes (Addendum)
Progress Note  Patient Name: [name] [name] Date of Encounter: 10/11/2019  Primary Cardiologist: No primary care provider on file.   Subjective   Lasix held yesterday, with mild improvement in renal function (Cr 1.8 ->1.7).  Continues to have significant diarrhea.  Reports mild shortness of breath this morning, but able to lie flat.  Denies any chest pain.  Inpatient Medications    Scheduled Meds: . aspirin EC  81 mg Oral Daily  . atorvastatin  40 mg Oral q1800  . carvedilol  12.5 mg Oral BID WC  . Chlorhexidine Gluconate Cloth  6 each Topical Daily  . insulin aspart  0-9 Units Subcutaneous TID WC  . potassium chloride  20 mEq Oral Daily   Continuous Infusions:  PRN Meds: acetaminophen, ondansetron (ZOFRAN) IV   Vital Signs    Vitals:   10/10/19 2033 10/10/19 2231 10/11/19 0348 10/11/19 0359  BP: (!) 93/59 (!) 103/51  112/67  Pulse: 60 62  (!) 58  Resp:      Temp: 98 F (36.7 C)   (!) 97.5 F (36.4 C)  TempSrc: Oral   Oral  SpO2: 91%   98%  Weight:   126.4 kg   Height:        Intake/Output Summary (Last 24 hours) at 10/11/2019 0746 Last data filed at 10/11/2019 0404 Gross per 24 hour  Intake 600 ml  Output 500 ml  Net 100 ml   Last 3 Weights 10/11/2019 10/10/2019 10/06/2019  Weight (lbs) 278 lb 11.2 oz 277 lb 1.9 oz (No Data)  Weight (kg) 126.417 kg 125.7 kg (No Data)      Telemetry    Sinus rhythm with rates 60s to 70s- Personally Reviewed  ECG    Sinus rhythm, rate 99, Q-waves V1-3.  Prior ECG from 2018 with Q-waves V1-2 - Personally Reviewed  Physical Exam   GEN: No acute distress.   Neck: No JVD appreciated but difficult to assess given body habitus Cardiac: RRR, no murmurs, rubs, or gallops.  Respiratory: Clear to auscultation bilaterally. GI: Soft, nontender, non-distended  MS: No edema Neuro:  Nonfocal  Psych: Normal affect   Labs    High Sensitivity Troponin:   Recent Labs  Lab 10/08/19 2211 10/09/19 0011  TROPONINIHS 869*  862*      Chemistry Recent Labs  Lab 10/06/19 0855 10/08/19 2211 10/10/19 0500 10/11/19 0527  NA 138 136 136 135  K 4.0 3.4* 3.4* 3.3*  CL 103 104 102 101  CO2 26 24 20* 23  GLUCOSE 116* 138* 122* 119*  BUN 28* 14 27* 38*  CREATININE 0.83 1.09 1.82* 1.72*  CALCIUM 8.6* 7.8* 7.5* 7.6*  PROT 6.1* 6.2*  --   --   ALBUMIN 3.5 2.8*  --   --   AST 11* 13*  --   --   ALT 9 14  --   --   ALKPHOS 88 69  --   --   BILITOT 0.7 1.4*  --   --   GFRNONAA >60 >60 35* 38*  GFRAA >60 >60 41* 43*  ANIONGAP 9 8 14 11      Hematology Recent Labs  Lab 10/06/19 0855 10/08/19 2211 10/09/19 0456  WBC 2.5* 0.3* 0.3*  RBC 3.37* 3.21* 3.18*  HGB 9.5* 8.9* 9.0*  HCT 28.2* 27.4* 27.1*  MCV 83.7 85.4 85.2  MCH 28.2 27.7 28.3  MCHC 33.7 32.5 33.2  RDW 14.8 14.5 14.8  PLT 89* 83* 98*    BNP Recent Labs  Lab 10/08/19 2258  BNP 1,106.6*     DDimer No results for input(s): DDIMER in the last 168 hours.   Radiology    ECHOCARDIOGRAM COMPLETE  Result Date: 10/09/2019   ECHOCARDIOGRAM REPORT   Patient Name:   [name] [name] Date of Exam: 10/09/2019 Medical Rec #:  [id number]     Height:       72.0 in Accession #:    [id number]    Weight:       304.5 lb Date of Birth:  1941/05/09     BSA:          2.55 m Patient Age:    77 years      BP:           131/81 mmHg Patient Gender: M             HR:           85 bpm. Exam Location:  Inpatient Procedure: 2D Echo, Cardiac Doppler, Color Doppler and Intracardiac            Opacification Agent Indications:    Congestive Heart Failure 428.0  History:        Patient has prior history of Echocardiogram examinations, most                 recent 09/12/2019. CHF, Arrythmias:PVC; Risk                 Factors:Hypertension, Diabetes, Dyslipidemia and Former Smoker.                 Elevated troponin. GERD. Palpitations.  Sonographer:    Paulita Fujita RDCS Referring Phys: [id number] [place]  1. Left ventricular ejection fraction, by visual estimation,  is 45 to 50%. The left ventricle has mildly decreased function. There is no left ventricular hypertrophy.  2. Mild hypokinesis of the left ventricular, mid-apical anterior wall and apical segment.  3. Definity contrast agent was given IV to delineate the left ventricular endocardial borders.  4. Left ventricular diastolic parameters are consistent with Grade I diastolic dysfunction (impaired relaxation).  5. Mild to moderately dilated left ventricular internal cavity size.  6. The left ventricle demonstrates regional wall motion abnormalities.  7. Global right ventricle has low normal systolic function.The right ventricular size is normal. No increase in right ventricular wall thickness.  8. Left atrial size was normal.  9. Right atrial size was normal. 10. The mitral valve is grossly normal. Trivial mitral valve regurgitation. 11. The tricuspid valve is grossly normal. Tricuspid valve regurgitation is mild. 12. The aortic valve is tricuspid. Aortic valve regurgitation is trivial. Mild aortic valve sclerosis without stenosis. 13. The pulmonic valve was grossly normal. Pulmonic valve regurgitation is not visualized. 14. Aortic dilatation noted. 15. 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 16. Mildly elevated pulmonary artery systolic pressure. 17. The inferior vena cava is dilated in size with >50% respiratory variability, suggesting right atrial pressure of 8 mmHg. FINDINGS  Left Ventricle: Left ventricular ejection fraction, by visual estimation, is 45 to 50%. The left ventricle has mildly decreased function. Mild hypokinesis of the left ventricular, mid-apical anterior wall and apical segment. Definity contrast agent was given IV to delineate the left ventricular endocardial borders. The left ventricle demonstrates regional wall motion abnormalities. The left ventricular internal cavity size was mildly to moderately dilated left ventricle. There is no left ventricular hypertrophy. Left  ventricular diastolic parameters are consistent with Grade I diastolic dysfunction (impaired relaxation).  Indeterminate filling pressures. Right Ventricle: The right ventricular size is normal. No increase in right ventricular wall thickness. Global RV systolic function is has low normal systolic function. The tricuspid regurgitant velocity is 2.62 m/s, and with an assumed right atrial pressure of 8 mmHg, the estimated right ventricular systolic pressure is mildly elevated at 35.5 mmHg. Left Atrium: Left atrial size was normal in size. Right Atrium: Right atrial size was normal in size Pericardium: There is no evidence of pericardial effusion. Mitral Valve: The mitral valve is grossly normal. Trivial mitral valve regurgitation. Tricuspid Valve: The tricuspid valve is grossly normal. Tricuspid valve regurgitation is mild. Aortic Valve: The aortic valve is tricuspid. Aortic valve regurgitation is trivial. Mild aortic valve sclerosis is present, with no evidence of aortic valve stenosis. Pulmonic Valve: The pulmonic valve was grossly normal. Pulmonic valve regurgitation is not visualized. Pulmonic regurgitation is not visualized. Aorta: Aortic dilatation noted. 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 Venous: The inferior vena cava is dilated in size with greater than 50% respiratory variability, suggesting right atrial pressure of 8 mmHg. IAS/Shunts: No atrial level shunt detected by color flow Doppler.  LEFT VENTRICLE PLAX 2D LVIDd:         6.30 cm       Diastology LVIDs:         4.20 cm       LV e' lateral:   10.30 cm/s LV PW:         1.00 cm       LV E/e' lateral: 6.3 LV IVS:        1.00 cm       LV e' medial:    7.18 cm/s LVOT diam:     2.20 cm       LV E/e' medial:  9.1 LV SV:         123 ml LV SV Index:   45.27 LVOT Area:     3.80 cm  LV Volumes (MOD) LV area d, A2C:    50.30 cm LV area d, A4C:    50.10 cm LV area s, A2C:    33.40 cm LV area s, A4C:    30.20 cm LV major d, A2C:   10.20 cm  LV major d, A4C:   9.67 cm LV major s, A2C:   8.58 cm LV major s, A4C:   7.69 cm LV vol d, MOD A2C: 208.0 ml LV vol d, MOD A4C: 214.0 ml LV vol s, MOD A2C: 111.0 ml LV vol s, MOD A4C: 98.8 ml LV SV MOD A2C:     97.0 ml LV SV MOD A4C:     214.0 ml LV SV MOD BP:      106.7 ml RIGHT VENTRICLE RV S prime:     12.90 cm/s TAPSE (M-mode): 2.2 cm LEFT ATRIUM             Index       RIGHT ATRIUM           Index LA diam:        3.70 cm 1.45 cm/m  RA Area:     15.60 cm LA Vol (A2C):   77.8 ml 30.54 ml/m RA Volume:   35.20 ml  13.82 ml/m LA Vol (A4C):   71.1 ml 27.91 ml/m LA Biplane Vol: 76.7 ml 30.11 ml/m  AORTIC VALVE LVOT Vmax:   131.00 cm/s LVOT Vmean:  91.400 cm/s LVOT VTI:    0.238 m  AORTA Ao Root diam:  4.00 cm MITRAL VALVE                        TRICUSPID VALVE MV Area (PHT): 3.42 cm             TR Peak grad:   27.5 mmHg MV PHT:        64.38 msec           TR Vmax:        262.00 cm/s MV Decel Time: 222 msec MV E velocity: 65.10 cm/s 103 cm/s  SHUNTS MV A velocity: 50.60 cm/s 70.3 cm/s Systemic VTI:  0.24 m MV E/A ratio:  1.29       1.5       Systemic Diam: 2.20 cm  Lyman Bishop MD Electronically signed by Lyman Bishop MD Signature Date/Time: 10/09/2019/4:09:07 PM    Final     Cardiac Studies   TTE 10/09/19:  1. Left ventricular ejection fraction, by visual estimation, is 45 to 50%. The left ventricle has mildly decreased function. There is no left ventricular hypertrophy.  2. Mild hypokinesis of the left ventricular, mid-apical anterior wall and apical segment.  3. Definity contrast agent was given IV to delineate the left ventricular endocardial borders.  4. Left ventricular diastolic parameters are consistent with Grade I diastolic dysfunction (impaired relaxation).  5. Mild to moderately dilated left ventricular internal cavity size.  6. The left ventricle demonstrates regional wall motion abnormalities.  7. Global right ventricle has low normal systolic function.The right ventricular size is  normal. No increase in right ventricular wall thickness.  8. Left atrial size was normal.  9. Right atrial size was normal. 10. The mitral valve is grossly normal. Trivial mitral valve regurgitation. 11. The tricuspid valve is grossly normal. Tricuspid valve regurgitation is mild. 12. The aortic valve is tricuspid. Aortic valve regurgitation is trivial. Mild aortic valve sclerosis without stenosis. 13. The pulmonic valve was grossly normal. Pulmonic valve regurgitation is not visualized. 14. Aortic dilatation noted. 15. 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 16. Mildly elevated pulmonary artery systolic pressure. 17. The inferior vena cava is dilated in size with >50% respiratory variability, suggesting right atrial pressure of 8 mmHg.  TTE 09/12/19:  1. Left ventricular ejection fraction, by visual estimation, is 60 to 65%. The left ventricle has normal function. There is moderately increased left ventricular hypertrophy.  2. Definity contrast agent was given IV to delineate the left ventricular endocardial borders.  3. Left ventricular diastolic parameters are consistent with Grade I diastolic dysfunction (impaired relaxation).  4. The tricuspid valve is normal in structure. Tricuspid valve regurgitation is trivial.  5. The aortic valve is normal in structure. Aortic valve regurgitation is trivial. No evidence of aortic valve sclerosis or stenosis.  6. There is mild dilatation of the ascending aorta measuring 39 mm.  Patient Profile     78 y.o. male with medical history significant forstage IV angioimmunoblastic T-cell lymphoma on chemotherapy (s/p cycle 1 doxorubicin, Cytoxan, Adcetris 09/29/2019), type 2 diabetes, hypertension, and hyperlipidemia who presents with acute systolic heart failure  Assessment & Plan    Acute systolic CHF:  normal systolic function 123456, with repeat 10/09/19 showing new mid-apical anterior WMA with EF 45-50%.  Started cycle 1 of  chemotherapy on 09/29/19 (doxorubicin, Cytoxan, Adcetris), worsening systolic function is concerning for chemotoxicity.  Troponin 869.  No chest pain or ST changes on ECG to suggest ACS.  Presented with volume overload, BNP 1100.  Low albumin (2.8) likely also contributing to volume -Plan for ischemia evaluation as outpatient -Continue carvedilol 12.5 mg twice daily -Would continue to hold diuresis today given patient is having significant diarrhea and mild improvement in renal function with holding Lasix yesterday  AKI: baseline Cr is 1.0, had increased to 3.6 on 12/8.  He received IV fluids as outpatient, with improvement to 0.8 on 12/14.  Was 1.1 on presentation on 12/16, worsened to 1.8 on 12/18 with diuresis, improved to 1.7 today with holding Lasix  T cell lymphoma:  Stage 4, s/p recent chemo. Pancytopenia  HLD:  Continue statin   DM:   SS insulin      For questions or updates, please contact [facility] Please consult [email or web address] for contact info under        Signed, Donato Heinz, MD  10/11/2019, 7:46 AM

## 2019-10-12 LAB — CBC WITH DIFFERENTIAL/PLATELET
Abs Immature Granulocytes: 0 10*3/uL (ref 0.00–0.07)
Basophils Absolute: 0 10*3/uL (ref 0.0–0.1)
Basophils Relative: 0 %
Eosinophils Absolute: 0.2 10*3/uL (ref 0.0–0.5)
Eosinophils Relative: 5 %
HCT: 27.2 % — ABNORMAL LOW (ref 39.0–52.0)
Hemoglobin: 8.6 g/dL — ABNORMAL LOW (ref 13.0–17.0)
Lymphocytes Relative: 20 %
Lymphs Abs: 0.7 10*3/uL (ref 0.7–4.0)
MCH: 27.4 pg (ref 26.0–34.0)
MCHC: 31.6 g/dL (ref 30.0–36.0)
MCV: 86.6 fL (ref 80.0–100.0)
Monocytes Absolute: 0.2 10*3/uL (ref 0.1–1.0)
Monocytes Relative: 5 %
Neutro Abs: 2.4 10*3/uL (ref 1.7–7.7)
Neutrophils Relative %: 70 %
Platelets: 200 10*3/uL (ref 150–400)
RBC: 3.14 MIL/uL — ABNORMAL LOW (ref 4.22–5.81)
RDW: 14.9 % (ref 11.5–15.5)
WBC: 3.4 10*3/uL — ABNORMAL LOW (ref 4.0–10.5)
nRBC: 0 % (ref 0.0–0.2)

## 2019-10-12 LAB — GLUCOSE, CAPILLARY
Glucose-Capillary: 102 mg/dL — ABNORMAL HIGH (ref 70–99)
Glucose-Capillary: 102 mg/dL — ABNORMAL HIGH (ref 70–99)
Glucose-Capillary: 102 mg/dL — ABNORMAL HIGH (ref 70–99)
Glucose-Capillary: 110 mg/dL — ABNORMAL HIGH (ref 70–99)

## 2019-10-12 LAB — RENAL FUNCTION PANEL
Albumin: 2.7 g/dL — ABNORMAL LOW (ref 3.5–5.0)
Anion gap: 10 (ref 5–15)
BUN: 30 mg/dL — ABNORMAL HIGH (ref 8–23)
CO2: 22 mmol/L (ref 22–32)
Calcium: 7.6 mg/dL — ABNORMAL LOW (ref 8.9–10.3)
Chloride: 104 mmol/L (ref 98–111)
Creatinine, Ser: 1.08 mg/dL (ref 0.61–1.24)
GFR calc Af Amer: >60 mL/min (ref >60)
GFR calc non Af Amer: >60 mL/min (ref >60)
Glucose, Bld: 112 mg/dL — ABNORMAL HIGH (ref 70–99)
Phosphorus: 2.8 mg/dL (ref 2.5–4.6)
Potassium: 3.5 mmol/L (ref 3.5–5.1)
Sodium: 136 mmol/L (ref 135–145)

## 2019-10-12 LAB — BASIC METABOLIC PANEL
Anion gap: 10 (ref 5–15)
BUN: 30 mg/dL — ABNORMAL HIGH (ref 8–23)
CO2: 23 mmol/L (ref 22–32)
Calcium: 7.7 mg/dL — ABNORMAL LOW (ref 8.9–10.3)
Chloride: 103 mmol/L (ref 98–111)
Creatinine, Ser: 1.19 mg/dL (ref 0.61–1.24)
GFR calc Af Amer: >60 mL/min (ref >60)
GFR calc non Af Amer: 59 mL/min — ABNORMAL LOW (ref >60)
Glucose, Bld: 111 mg/dL — ABNORMAL HIGH (ref 70–99)
Potassium: 3.5 mmol/L (ref 3.5–5.1)
Sodium: 136 mmol/L (ref 135–145)

## 2019-10-12 LAB — MAGNESIUM: Magnesium: 2.2 mg/dL (ref 1.7–2.4)

## 2019-10-12 MED ORDER — POTASSIUM CHLORIDE CRYS ER 20 MEQ PO TBCR
40.0000 meq | EXTENDED_RELEASE_TABLET | Freq: Once | ORAL | Status: AC
  Administered 2019-10-12: 40 meq via ORAL
  Filled 2019-10-12: qty 2

## 2019-10-12 MED ORDER — LOSARTAN POTASSIUM 25 MG PO TABS
25.0000 mg | ORAL_TABLET | Freq: Every day | ORAL | Status: DC
  Administered 2019-10-12 – 2019-10-14 (×3): 25 mg via ORAL
  Filled 2019-10-12 (×3): qty 1

## 2019-10-12 NOTE — Progress Notes (Signed)
PROGRESS NOTE  [name] [name]  DOB: 1941-05-23  PCP: Colon Branch, MD ZOX:[id number]  DOA: 10/08/2019  LOS: 3 days   Chief Complaint  Patient presents with  . 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  Patient was admitted with dyspnea, orthopnea, PND and worsening bilateral pedal edema (acute CHF).  As per prior documentation "patient reports about 10 months of progressive shortness of breath.  He was diagnosed with stage IV angioimmunoblastic T-cell lymphoma in October 2020.  He had an echocardiogram on 09/12/2019 which showed an EF of 60-65% with grade 1 diastolic dysfunction.  He completed cycle 1 of chemotherapy with doxorubicin, Cytoxan, Adcetris on 09/29/2019. Since starting chemotherapy he has developed chemotherapy associated pancytopenia.  He has not had any obvious bleeding.  He has required occasional fluid boluses due to acute kidney injury. In the ED, patient required 2 to 3 L oxygen via nasal cannula to maintain O2 sat. Labs notable for WBC 0.3, hemoglobin 8.9, platelets 83,000, sodium 136, potassium 3.4, bicarb 24, BUN 14, creatinine 1.09, AST 13, ALT 14, alk phos 69, total bilirubin 1.4, high-sensitivity troponin I 869, BNP 1106.6. SARS-CoV-2 PCR test negative as well. Portable chest x-ray showed right Port-A-Cath in place and patchy interstitial and airspace opacities throughout both lungs with bilateral effusions.  EKG showed sinus tachycardia, first-degree AV block, T wave flattening throughout, and low voltage. Hospitalist service was consulted to admit for further evaluation and management".  Subjective: -No shortness of breath. -Diarrhea has improved significantly.  According to the patient, he has only had 3 diarrhea  episodes today.    Assessment/Plan: Acute respiratory failure with hypoxia  Acute congestive heart failure: -Presented with orthopnea, PND, pedal edema, volume overload state -BNP is 1106.6.   -Chest x-ray showed pulmonary edema.  -Echocardiogram 09/12/2019 showed EF 60-65% with grade 1 diastolic dysfunction prior to beginning chemotherapy. -Repeat echocardiogram on 12/7 showed a drop in EF to 45 to 50% with mild hypokinesis of the left ventricle, grade 1 diastolic dysfunction. -Currently on IV Lasix 60 mg twice daily -Creatinine worsened from 1.09 yesterday to 1.82 today.  Held Lasix this morning. Continue Coreg.  Hold losartan. -Continue to monitor in telemetry. -Monitor strict I/O's, daily weights -Continue supplemental oxygen as needed. 10/12/2019: Stable.  Elevated troponin: New drop in ejection fraction High-sensitivity troponin I 869. Suspect demand ischemia from above, continue to trend. Continue aspirin 81 mg for now, continue to monitor platelets. -With new drop in EF, I will cardiology consultation. 10/12/2019: Cardiology input is appreciated.  For further work-up on outpatient basis.  Chronic diarrhea: -Patient apparently has had multiple episodes of diarrhea since presentation. -C. difficile negative.  GI pathogen panel sent. -May benefit from a Flexi-Seal.  10/12/2019: Hopefully, this not related to chemotherapy.  Diarrhea is improving.  GI panel is still pending.  Acute kidney injury -Baseline creatinine less than 1.  Creatinine elevated to 1.82 today.  The problem is likely because of dehydration from diarrhea as well as Lasix. -Hold IV Lasix. 10/11/2019: Acute kidney injury continues to improve.  Continue to monitor renal function and electrolytes. 10/12/2019: Acute kidney injury has resolved.  Serum creatinine today is 1.08.  Lasix is on hold.  Continue to monitor patient's cardiac/respiratory symptoms closely.  Stage IV angioimmunoblastic T-cell lymphoma with  chemotherapy-induced pancytopenia: Follows with oncology, Dr. Maylon Peppers.  Completed cycle 1 of doxorubicin, Cytoxan, and Adcetris on 09/29/2019.  He is afebrile and without any obvious bleeding.  Leukopenia worsened from prior with relatively stable hemoglobin and platelet count.  Continue to monitor CBC.  Hypokalemia/hypomagnesemia -Potassium and magnesium low likely because of diarrhea.  Replaced. 10/12/2019: Continue to monitor and replete.  Will order K-Dur 40 M EQ p.o. x1 dose today.  Hypertension: Continue Coreg.  Losartan and Lasix as above.  Last documented blood pressure is 140/72 mmHg.  Type 2 diabetes: On Metformin as an outpatient.  A1c is 6.1.  Will use sensitive SSI while in hospital.  Hyperlipidemia: Continue statin.  Mobility: PT eval Diet: Cardiac diet Fluid: Not on IV fluids DVT prophylaxis:  SCDs Code Status:  DNR Family Communication:    Consultants:  Cardiology  Procedures:  None  Antimicrobials: Anti-infectives (From admission, onward)   None        Code Status: DNR   Diet Order            Diet heart healthy/carb modified Room service appropriate? Yes; Fluid consistency: Thin; Fluid restriction: 1500 mL Fluid  Diet effective now              Infusions:    Scheduled Meds: . aspirin EC  81 mg Oral Daily  . atorvastatin  40 mg Oral q1800  . carvedilol  12.5 mg Oral BID WC  . Chlorhexidine Gluconate Cloth  6 each Topical Daily  . insulin aspart  0-9 Units Subcutaneous TID WC  . losartan  25 mg Oral Daily  . potassium chloride  20 mEq Oral Daily    PRN meds: acetaminophen, ondansetron (ZOFRAN) IV   Objective: Vitals:   10/12/19 1439 10/12/19 1728  BP: 129/70 140/72  Pulse: 61 60  Resp: 14   Temp: (!) 97.4 F (36.3 C)   SpO2: 96%    No intake or output data in the 24 hours ending 10/12/19 1824 Filed Weights   10/10/19 1840 10/11/19 0348  Weight: 125.7 kg 126.4 kg   Weight change:  Body mass index is 37.8 kg/m.   Physical  Exam: General exam: Appears calm and comfortable.  HEENT: Mild pallor, no jaundice. Lungs: Decreased air entry with end inspiratory wheeze. CVS: S1-S2. GI/Abd obese, soft, nontender, organs are difficult to assess. CNS: Alert, awake, oriented x3 Extremities: Mild leg edema.  Data Review: I have personally reviewed the laboratory data and studies available.  Recent Labs  Lab 10/06/19 0855 10/08/19 2211 10/09/19 0456 10/12/19 0514  WBC 2.5* 0.3* 0.3* 3.4*  NEUTROABS 1.6* 0.1* 0.1* 2.4  HGB 9.5* 8.9* 9.0* 8.6*  HCT 28.2* 27.4* 27.1* 27.2*  MCV 83.7 85.4 85.2 86.6  PLT 89* 83* 98* 200   Recent Labs  Lab 10/06/19 0855 10/08/19 2211 10/09/19 0456 10/10/19 0500 10/11/19 0527 10/11/19 1520 10/12/19 0514  NA 138 136  --  136 135  --  136  136  K 4.0 3.4*  --  3.4* 3.3*  --  3.5  3.5  CL 103 104  --  102 101  --  104  103  CO2 26 24  --  20* 23  --  22  23  GLUCOSE 116* 138*  --  122* 119*  --  112*  111*  BUN 28* 14  --  27* 38*  --  30*  30*  CREATININE 0.83 1.09  --  1.82* 1.72*  --  1.08  1.19  CALCIUM 8.6* 7.8*  --  7.5* 7.6*  --  7.6*  7.7*  MG  --   --  1.6*  --   --  2.2 2.2  PHOS  --   --   --   --   --   --  2.8    Bonnell Public, MD  [facility] 10/12/2019

## 2019-10-12 NOTE — Progress Notes (Signed)
Progress Note  Patient Name: [name] [name] Date of Encounter: 10/12/2019  Primary Cardiologist: No primary care provider on file.   Subjective   Lasix held yesterday, with significant improvement in renal function (creatinine 1.7 to 1.1).  Reports that his dyspnea has improved.  Continues to have diarrhea.  Inpatient Medications    Scheduled Meds: . aspirin EC  81 mg Oral Daily  . atorvastatin  40 mg Oral q1800  . carvedilol  12.5 mg Oral BID WC  . Chlorhexidine Gluconate Cloth  6 each Topical Daily  . insulin aspart  0-9 Units Subcutaneous TID WC  . potassium chloride  20 mEq Oral Daily   Continuous Infusions:  PRN Meds: acetaminophen, ondansetron (ZOFRAN) IV   Vital Signs    Vitals:   10/11/19 0359 10/11/19 0846 10/11/19 2114 10/12/19 0651  BP: 112/67 134/68 121/68 (!) 154/73  Pulse: (!) 58 64 (!) 57 (!) 57  Resp:  18 18 20   Temp: (!) 97.5 F (36.4 C)  97.9 F (36.6 C) 97.6 F (36.4 C)  TempSrc: Oral  Oral Oral  SpO2: 98% 98% 92% 97%  Weight:      Height:        Intake/Output Summary (Last 24 hours) at 10/12/2019 0834 Last data filed at 10/11/2019 1808 Gross per 24 hour  Intake 410 ml  Output --  Net 410 ml   Last 3 Weights 10/11/2019 10/10/2019 10/06/2019  Weight (lbs) 278 lb 11.2 oz 277 lb 1.9 oz (No Data)  Weight (kg) 126.417 kg 125.7 kg (No Data)      Telemetry    Sinus rhythm with rates 60s, frequent PACs- Personally Reviewed  ECG    Sinus rhythm, rate 99, Q-waves V1-3.  Prior ECG from 2018 with Q-waves V1-2 - Personally Reviewed  Physical Exam   GEN: No acute distress.   Neck: No JVD appreciated but difficult to assess given body habitus Cardiac: RRR, no murmurs, rubs, or gallops.  Respiratory: Clear to auscultation bilaterally. GI: Soft, nontender, non-distended  MS: No edema Neuro:  Nonfocal  Psych: Normal affect   Labs    High Sensitivity Troponin:   Recent Labs  Lab 10/08/19 2211 10/09/19 0011  TROPONINIHS 869* 862*       Chemistry Recent Labs  Lab 10/06/19 0855 10/06/19 0855 10/08/19 2211 10/10/19 0500 10/11/19 0527 10/12/19 0514  NA 138  --  136 136 135 136  136  K 4.0  --  3.4* 3.4* 3.3* 3.5  3.5  CL 103  --  104 102 101 104  103  CO2 26  --  24 20* 23 22  23   GLUCOSE 116*  --  138* 122* 119* 112*  111*  BUN 28*  --  14 27* 38* 30*  30*  CREATININE 0.83   < > 1.09 1.82* 1.72* 1.08  1.19  CALCIUM 8.6*  --  7.8* 7.5* 7.6* 7.6*  7.7*  PROT 6.1*  --  6.2*  --   --   --   ALBUMIN 3.5  --  2.8*  --   --  2.7*  AST 11*  --  13*  --   --   --   ALT 9  --  14  --   --   --   ALKPHOS 88  --  69  --   --   --   BILITOT 0.7  --  1.4*  --   --   --   GFRNONAA >60   < > >  60 35* 38* >60  59*  GFRAA >60   < > >60 41* 43* >60  >60  ANIONGAP 9  --  8 14 11 10  10    < > = values in this interval not displayed.     Hematology Recent Labs  Lab 10/08/19 2211 10/09/19 0456 10/12/19 0514  WBC 0.3* 0.3* 3.4*  RBC 3.21* 3.18* 3.14*  HGB 8.9* 9.0* 8.6*  HCT 27.4* 27.1* 27.2*  MCV 85.4 85.2 86.6  MCH 27.7 28.3 27.4  MCHC 32.5 33.2 31.6  RDW 14.5 14.8 14.9  PLT 83* 98* 200    BNP Recent Labs  Lab 10/08/19 2258  BNP 1,106.6*     DDimer No results for input(s): DDIMER in the last 168 hours.   Radiology    No results found.  Cardiac Studies   TTE 10/09/19:  1. Left ventricular ejection fraction, by visual estimation, is 45 to 50%. The left ventricle has mildly decreased function. There is no left ventricular hypertrophy.  2. Mild hypokinesis of the left ventricular, mid-apical anterior wall and apical segment.  3. Definity contrast agent was given IV to delineate the left ventricular endocardial borders.  4. Left ventricular diastolic parameters are consistent with Grade I diastolic dysfunction (impaired relaxation).  5. Mild to moderately dilated left ventricular internal cavity size.  6. The left ventricle demonstrates regional wall motion abnormalities.  7. Global right  ventricle has low normal systolic function.The right ventricular size is normal. No increase in right ventricular wall thickness.  8. Left atrial size was normal.  9. Right atrial size was normal. 10. The mitral valve is grossly normal. Trivial mitral valve regurgitation. 11. The tricuspid valve is grossly normal. Tricuspid valve regurgitation is mild. 12. The aortic valve is tricuspid. Aortic valve regurgitation is trivial. Mild aortic valve sclerosis without stenosis. 13. The pulmonic valve was grossly normal. Pulmonic valve regurgitation is not visualized. 14. Aortic dilatation noted. 15. 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 16. Mildly elevated pulmonary artery systolic pressure. 17. The inferior vena cava is dilated in size with >50% respiratory variability, suggesting right atrial pressure of 8 mmHg.  TTE 09/12/19:  1. Left ventricular ejection fraction, by visual estimation, is 60 to 65%. The left ventricle has normal function. There is moderately increased left ventricular hypertrophy.  2. Definity contrast agent was given IV to delineate the left ventricular endocardial borders.  3. Left ventricular diastolic parameters are consistent with Grade I diastolic dysfunction (impaired relaxation).  4. The tricuspid valve is normal in structure. Tricuspid valve regurgitation is trivial.  5. The aortic valve is normal in structure. Aortic valve regurgitation is trivial. No evidence of aortic valve sclerosis or stenosis.  6. There is mild dilatation of the ascending aorta measuring 39 mm.  Patient Profile     78 y.o. male with medical history significant forstage IV angioimmunoblastic T-cell lymphoma on chemotherapy (s/p cycle 1 doxorubicin, Cytoxan, Adcetris 09/29/2019), type 2 diabetes, hypertension, and hyperlipidemia who presents with acute systolic heart failure  Assessment & Plan    Acute systolic CHF:  normal systolic function 123456, with repeat 10/09/19  showing new mid-apical anterior WMA with EF 45-50%.  Started cycle 1 of chemotherapy on 09/29/19 (doxorubicin, Cytoxan, Adcetris), worsening systolic function is concerning for chemotoxicity.  Troponin 869.  No chest pain or ST changes on ECG to suggest ACS.  Presented with volume overload, BNP 1100.  Low albumin (2.8) likely also contributing to volume overload -Plan for ischemia evaluation as  outpatient -Continue carvedilol 12.5 mg twice daily -Given resolution of AKI and elevated BP this morning, will add losartan -Would continue to hold diuresis today given patient is having significant diarrhea and AKI has resolved with holding lasix  AKI: baseline Cr is 1.0, had increased to 3.6 on 12/8.  He received IV fluids as outpatient, with improvement to 0.8 on 12/14.  Was 1.1 on presentation on 12/16, worsened to 1.8 on 12/18 with diuresis, improved to 1.1 today with holding Lasix  T cell lymphoma:  Stage 4, s/p recent chemo. Pancytopenia  HLD:  Continue statin   DM:   SS insulin      For questions or updates, please contact [place] Please consult [email or web address] for contact info under        Signed, Donato Heinz, MD  10/12/2019, 8:34 AM

## 2019-10-13 ENCOUNTER — Encounter (HOSPITAL_COMMUNITY): Payer: Self-pay | Admitting: Internal Medicine

## 2019-10-13 ENCOUNTER — Ambulatory Visit: Payer: HMO

## 2019-10-13 DIAGNOSIS — C8440 Peripheral T-cell lymphoma, not classified, unspecified site: Secondary | ICD-10-CM

## 2019-10-13 LAB — GLUCOSE, CAPILLARY
Glucose-Capillary: 110 mg/dL — ABNORMAL HIGH (ref 70–99)
Glucose-Capillary: 113 mg/dL — ABNORMAL HIGH (ref 70–99)
Glucose-Capillary: 110 mg/dL — ABNORMAL HIGH (ref 70–99)
Glucose-Capillary: 114 mg/dL — ABNORMAL HIGH (ref 70–99)

## 2019-10-13 LAB — BASIC METABOLIC PANEL
Anion gap: 10 (ref 5–15)
BUN: 20 mg/dL (ref 8–23)
CO2: 23 mmol/L (ref 22–32)
Calcium: 7.8 mg/dL — ABNORMAL LOW (ref 8.9–10.3)
Chloride: 103 mmol/L (ref 98–111)
Creatinine, Ser: 1.1 mg/dL (ref 0.61–1.24)
GFR calc Af Amer: >60 mL/min (ref >60)
GFR calc non Af Amer: >60 mL/min (ref >60)
Glucose, Bld: 110 mg/dL — ABNORMAL HIGH (ref 70–99)
Potassium: 4 mmol/L (ref 3.5–5.1)
Sodium: 136 mmol/L (ref 135–145)

## 2019-10-13 LAB — CBC
HCT: 29.2 % — ABNORMAL LOW (ref 39.0–52.0)
Hemoglobin: 9.2 g/dL — ABNORMAL LOW (ref 13.0–17.0)
MCH: 27.5 pg (ref 26.0–34.0)
MCHC: 31.5 g/dL (ref 30.0–36.0)
MCV: 87.2 fL (ref 80.0–100.0)
Platelets: 308 10*3/uL (ref 150–400)
RBC: 3.35 MIL/uL — ABNORMAL LOW (ref 4.22–5.81)
RDW: 14.9 % (ref 11.5–15.5)
WBC: 4.8 10*3/uL (ref 4.0–10.5)
nRBC: 0 % (ref 0.0–0.2)

## 2019-10-13 LAB — BRAIN NATRIURETIC PEPTIDE: B Natriuretic Peptide: 831.8 pg/mL — ABNORMAL HIGH (ref 0.0–100.0)

## 2019-10-13 MED ORDER — ENSURE ENLIVE PO LIQD
237.0000 mL | Freq: Two times a day (BID) | ORAL | Status: DC
  Administered 2019-10-13 – 2019-10-14 (×2): 237 mL via ORAL

## 2019-10-13 MED ORDER — HEPARIN SODIUM (PORCINE) 5000 UNIT/ML IJ SOLN
5000.0000 [IU] | Freq: Three times a day (TID) | INTRAMUSCULAR | Status: DC
  Administered 2019-10-13 – 2019-10-20 (×20): 5000 [IU] via SUBCUTANEOUS
  Filled 2019-10-13 (×21): qty 1

## 2019-10-13 MED ORDER — LIVING BETTER WITH HEART FAILURE BOOK: Freq: Once | Status: AC

## 2019-10-13 MED ORDER — ADULT MULTIVITAMIN W/MINERALS CH
1.0000 | ORAL_TABLET | Freq: Every day | ORAL | Status: DC
  Administered 2019-10-13 – 2019-10-20 (×8): 1 via ORAL
  Filled 2019-10-13 (×8): qty 1

## 2019-10-13 NOTE — Care Management Important Message (Signed)
Important Message  Patient Details IM Letter given to [place] Case Manager to present to the Patient Name: [name] [name] MRN: [id number] Date of Birth: Apr 05, 1941   Medicare Important Message Given:  Yes     Kerin Salen 10/13/2019, 1:24 PM

## 2019-10-13 NOTE — Progress Notes (Signed)
Pt with complaints of inability to catch breath. Assessment completed at this time. Pt instructed to deep breath slowly. 2L [place] applied for comfort.   MD notified and will come to bedside.

## 2019-10-13 NOTE — TOC Initial Note (Signed)
Transition of Care [facility]) - Initial/Assessment Note    Patient Details  Name: [name] [name] MRN: [id number] Date of Birth: [birth year]  [facility] CM/SW Contact:    Dessa Phi, RN Phone Number: 10/13/2019, 12:14 PM  Clinical Narrative:  Spoke to patient about d/c plans-home w/spouse has rw,cane,states he can manage. No CM needs.                 Expected Discharge Plan: Home/Self Care Barriers to Discharge: Continued Medical Work up   Patient Goals and CMS Choice Patient states their goals for this hospitalization and ongoing recovery are:: go home      Expected Discharge Plan and Services Expected Discharge Plan: Home/Self Care   Discharge Planning Services: CM Consult   Living arrangements for the past 2 months: Single Family Home                                      Prior Living Arrangements/Services Living arrangements for the past 2 months: Single Family Home Lives with:: Spouse Patient language and need for interpreter reviewed:: Yes Do you feel safe going back to the place where you live?: Yes      Need for Family Participation in Patient Care: No (Comment) Care giver support system in place?: Yes (comment) Current home services: DME(cane, rw) Criminal Activity/Legal Involvement Pertinent to Current Situation/Hospitalization: No - Comment as needed  Activities of Daily Living Home Assistive Devices/Equipment: Cane (specify quad or straight) ADL Screening (condition at time of admission) Patient's cognitive ability adequate to safely complete daily activities?: Yes Is the patient deaf or have difficulty hearing?: No Does the patient have difficulty seeing, even when wearing glasses/contacts?: No Does the patient have difficulty concentrating, remembering, or making decisions?: No Patient able to express need for assistance with ADLs?: Yes Does the patient have difficulty dressing or bathing?: No Independently performs ADLs?: Yes  (appropriate for developmental age) Does the patient have difficulty walking or climbing stairs?: Yes Weakness of Legs: Left Weakness of Arms/Hands: None  Permission Sought/Granted Permission sought to share information with : Case Manager Permission granted to share information with : Yes, Verbal Permission Granted              Emotional Assessment Appearance:: Appears stated age Attitude/Demeanor/Rapport: Gracious Affect (typically observed): Accepting Orientation: : Oriented to Self, Oriented to Place, Oriented to  Time, Oriented to Situation Alcohol / Substance Use: Not Applicable Psych Involvement: No (comment)  Admission diagnosis:  Acute respiratory failure with hypoxia ([place]) [J96.01] Patient Active Problem List   Diagnosis Date Noted  . Acute respiratory failure with hypoxia ([place]) 10/09/2019  . Acute CHF (congestive heart failure) ([place]) 10/09/2019  . Elevated troponin 10/09/2019  . Antineoplastic chemotherapy induced pancytopenia ([place]) 10/09/2019  . Anemia due to antineoplastic chemotherapy 10/06/2019  . Leukopenia due to antineoplastic chemotherapy ([place]) 10/06/2019  . Chemotherapy-induced thrombocytopenia 10/06/2019  . Angioimmunoblastic lymphoma ([place]) 09/08/2019  . Stasis dermatitis of both legs 06/21/2019  . Diabetes mellitus, type II ([place]) 07/09/2017  . Status post lumbar surgery 06/06/2017  . 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 PVC's (premature ventricular contractions) 11/24/2015  . PCP NOTES >>>>>>>>>>>>>>>>>>>>>> 06/23/2015  . Morbid obesity ([place]) 02/24/2015  . Bradycardia 11/19/2013  . 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 Palpitations 04/19/2013  . Annual physical exam 12/07/2011  . R calf larger than L, chronic, U/S (-) for DVT 11-2011 12/07/2011  . Osteoarthritis 01/30/2008  .  Hyperlipidemia associated with type 2 diabetes mellitus ([place]) 12/19/2007  . Hypertension associated with diabetes ([place]) 12/19/2007  . ERECTILE DYSFUNCTION 01/28/2007  . GERD  01/28/2007   PCP:  Colon Branch, MD Pharmacy:   [id number] - [place], [address] [address] Phone: [phone] Fax: [phone]     Social Determinants of Health (SDOH) Interventions    Readmission Risk Interventions No flowsheet data found.

## 2019-10-13 NOTE — Progress Notes (Addendum)
Progress Note  Patient Name: [name] [name] Date of Encounter: 10/13/2019  Primary Cardiologist: Mertie Moores, MD  Subjective   Started the AM OK but now feels SOB with exertion. Denies any orthopnea. No CP. Denies any unusual LEE - states his legs are always a little stiff.  Inpatient Medications    Scheduled Meds: . aspirin EC  81 mg Oral Daily  . atorvastatin  40 mg Oral q1800  . carvedilol  12.5 mg Oral BID WC  . Chlorhexidine Gluconate Cloth  6 each Topical Daily  . insulin aspart  0-9 Units Subcutaneous TID WC  . losartan  25 mg Oral Daily  . potassium chloride  20 mEq Oral Daily   Continuous Infusions:  PRN Meds: acetaminophen, ondansetron (ZOFRAN) IV   Vital Signs    Vitals:   10/12/19 1728 10/12/19 2109 10/13/19 0414 10/13/19 0815  BP: 140/72 133/74 (!) 149/68   Pulse: 60 62 63 70  Resp:  20 20   Temp:  97.9 F (36.6 C) 97.7 F (36.5 C)   TempSrc:      SpO2:  98% 95% 96%  Weight:      Height:       No intake or output data in the 24 hours ending 10/13/19 0944 Last 3 Weights 10/11/2019 10/10/2019 10/06/2019  Weight (lbs) 278 lb 11.2 oz 277 lb 1.9 oz (No Data)  Weight (kg) 126.417 kg 125.7 kg (No Data)     Telemetry    NSR with occasional PACs, one PVC triplet - Personally Reviewed  Physical Exam   GEN: No acute distress; obese HEENT: Normocephalic, atraumatic, sclera non-icteric. Neck: No JVD or bruits. Cardiac: RRR no murmurs, rubs, or gallops.  Radials/DP/PT 1+ and equal bilaterally.  Respiratory: Clear to auscultation bilaterally. Breathing is unlabored. GI: Soft, nontender, non-distended, BS +x 4. MS: no deformity. Extremities: No clubbing or cyanosis. No edema. Distal pedal pulses are 2+ and equal bilaterally. Neuro:  AAOx3. Follows commands. Psych:  Responds to questions appropriately with a normal affect.  Labs    High Sensitivity Troponin:   Recent Labs  Lab 10/08/19 2211 10/09/19 0011  TROPONINIHS 869* 862*      Cardiac  EnzymesNo results for input(s): TROPONINI in the last 168 hours. No results for input(s): TROPIPOC in the last 168 hours.   Chemistry Recent Labs  Lab 10/08/19 2211 10/11/19 0527 10/12/19 0514 10/13/19 0436  NA 136 135 136  136 136  K 3.4* 3.3* 3.5  3.5 4.0  CL 104 101 104  103 103  CO2 24 23 22  23 23   GLUCOSE 138* 119* 112*  111* 110*  BUN 14 38* 30*  30* 20  CREATININE 1.09 1.72* 1.08  1.19 1.10  CALCIUM 7.8* 7.6* 7.6*  7.7* 7.8*  PROT 6.2*  --   --   --   ALBUMIN 2.8*  --  2.7*  --   AST 13*  --   --   --   ALT 14  --   --   --   ALKPHOS 69  --   --   --   BILITOT 1.4*  --   --   --   GFRNONAA >60 38* >60  59* >60  GFRAA >60 43* >60  >60 >60  ANIONGAP 8 11 10  10 10      Hematology Recent Labs  Lab 10/08/19 2211 10/09/19 0456 10/12/19 0514  WBC 0.3* 0.3* 3.4*  RBC 3.21* 3.18* 3.14*  HGB 8.9* 9.0* 8.6*  HCT 27.4* 27.1* 27.2*  MCV 85.4 85.2 86.6  MCH 27.7 28.3 27.4  MCHC 32.5 33.2 31.6  RDW 14.5 14.8 14.9  PLT 83* 98* 200    BNP Recent Labs  Lab 10/08/19 2258  BNP 1,106.6*     DDimer No results for input(s): DDIMER in the last 168 hours.   Radiology    No results found.  Cardiac Studies   TTE 10/09/19: 1. Left ventricular ejection fraction, by visual estimation, is 45 to 50%. The left ventricle has mildly decreased function. There is no left ventricular hypertrophy. 2. Mild hypokinesis of the left ventricular, mid-apical anterior wall and apical segment. 3. Definity contrast agent was given IV to delineate the left ventricular endocardial borders. 4. Left ventricular diastolic parameters are consistent with Grade I diastolic dysfunction (impaired relaxation). 5. Mild to moderately dilated left ventricular internal cavity size. 6. The left ventricle demonstrates regional wall motion abnormalities. 7. Global right ventricle has low normal systolic function.The right ventricular size is normal. No increase in right ventricular wall  thickness. 8. Left atrial size was normal. 9. Right atrial size was normal. 10. The mitral valve is grossly normal. Trivial mitral valve regurgitation. 11. The tricuspid valve is grossly normal. Tricuspid valve regurgitation is mild. 12. The aortic valve is tricuspid. Aortic valve regurgitation is trivial. Mild aortic valve sclerosis without stenosis. 13. The pulmonic valve was grossly normal. Pulmonic valve regurgitation is not visualized. 14. Aortic dilatation noted. 15. 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 16. Mildly elevated pulmonary artery systolic pressure. 17. The inferior vena cava is dilated in size with >50% respiratory variability, suggesting right atrial pressure of 8 mmHg.  TTE 09/12/19: 1. Left ventricular ejection fraction, by visual estimation, is 60 to 65%. The left ventricle has normal function. There is moderately increased left ventricular hypertrophy. 2. Definity contrast agent was given IV to delineate the left ventricular endocardial borders. 3. Left ventricular diastolic parameters are consistent with Grade I diastolic dysfunction (impaired relaxation). 4. The tricuspid valve is normal in structure. Tricuspid valve regurgitation is trivial. 5. The aortic valve is normal in structure. Aortic valve regurgitation is trivial. No evidence of aortic valve sclerosis or stenosis. 6. There is mild dilatation of the ascending aorta measuring 39 mm.  Patient Profile     78 y.o. 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  Assessment & Plan    1. Acute systolic CHF - baseline normal systolic function [id number], with repeat 10/09/19 showing new  mid-apical anterior WMA with EF 45-50%.  He had started cycle 1 of chemotherapy on 09/29/19 (doxorubicin, Cytoxan, Adcetris). Worsening systolic function is concerning for chemotoxicity. He presented with volume overload, BNP 1100. CXR showed patchy airspace opacities with layering bilateral effusions. Low albumin (2.8) likely also contributing to volume overload. He is on carvedilol. Losartan started yesterday. He received 3 doses of IV Lasix on admission - further diuretics have been on hold due to diarrhea and AKI, the latter of which resolved after holding Lasix. Will discuss plan with MD given that patient is experiencing recurrent dyspnea. Exam not particularly revealing but contact precaution stethoscope is less sensitive. Will order f/u CBC and BNP this AM. He has not been weighed since 12/19 so this has been requested. Have also ordered strict  I/O's. Reviewed 2g sodium restriction, 2L fluid restriction, daily weights with patient. May also need to involve oncology as to how this relates to plans for future chemotherapy - 6 cycles were originally planned per onc note 10/06/19.   2. Elevated troponin - Troponin peak 869.  No chest pain or ST changes on ECG to suggest ACS. Continue ASA as platelets allow. Continue statin, BB. Lipid profile ordered for AM. Plan is for ischemic evaluation as outpatient to consider [place] nuc. Will clarify with MD if this is to occur before or after a f/u visit.  3. Acute kidney injury - labile recently, as high as 3.58 on 12/8. Peak was 1.82 this admission, now creatinine has normalized.  4. Stage IV angioimmunoblastic T-cell lymphoma withchemotherapy-induced pancytopenia - per oncology, s/p recent chemo.  5. HLD - continue statin.  6. Mild dilation of ascending aorta - given comorbidities, conservative management likely appropriate. Will likely be followed anyway on future echocardiograms.  For questions or updates, please contact [place] Please consult  [email or web address] for contact info under Cardiology/STEMI.  Signed, Charlie Pitter, PA-C 10/13/2019, 9:44 AM     Patient seen and examined.  Agree with above documentation. Reports persistent diarrhea.  Continues to have dyspnea.  On exam, he is alert and oriented, no JVD appreciated, trace edema, lungs clear, regular rate and rhythm, no murmurs.  Labs notable for normal renal function (Scr 1.1).  I suspect he will need diuresis moving forward but given his persistent diarrhea and bump in Scr with attempting diuresis, would hold until his diarrhea resolves.  Continue losartan and toprol XL.  Donato Heinz, MD.

## 2019-10-13 NOTE — Progress Notes (Signed)
Initial Nutrition Assessment  RD working remotely.   DOCUMENTATION CODES:   Obesity unspecified  INTERVENTION:  - will order Ensure Enlive po BID, each supplement provides 350 kcal and 20 grams of protein - will order daily multivitamin with minerals.  - continue to encourage PO intakes.    NUTRITION DIAGNOSIS:   Increased nutrient needs related to acute illness, chronic illness, cancer and cancer related treatments as evidenced by estimated needs.  GOAL:   Patient will meet greater than or equal to 90% of their needs  MONITOR:   PO intake, Supplement acceptance, Labs, Weight trends  REASON FOR ASSESSMENT:   Malnutrition Screening Tool  ASSESSMENT:   78 y.o. 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 He presented to the ED for evaluation of dyspnea and non-productive cough. He reported progressive SOB x10 months and that he was diagnosed with cancer in October. He reported BLE edema/swelling which is chronic. Patient stated he has increased urinary frequency without dysuria.  Per flow sheet documentation, patient consumed 100% of dinner on 12/18; 100% of breakfast and 0% of dinner on 12/19; 75% of breakfast on 12/20; 25% of breakfast on 12/21.   Patient was seen remotely by [place] on 12/8 at which time he reported very poor intakes d/t N/V and abdominal pain. He was having diarrhea 3-4 times/day as of that date. He was drinking strawberry Glucerna, usually only a half bottle/day.   Patient reports that he has a good appetite as long as pain and nausea are controlled and as long as he likes what is served to him. His daughter often helps with meals when he is at home.   Per chart review, current weight is 274 lb and weight on 11/27 was 289 lb. This indicates 15 lb weight loss (5.2% body weight) in a little under 1 month; significant  for time frame. Patient at high risk of malnutrition, especially if intakes again become poor.  Per notes: - acute respiratory failure with hypoxia - acute congestive HF - volume overloaded on admission--1.5L fluid restriction - elevated troponin with new drop in EF--Cardiology consulted - chronic diarrhea--c.diff negative, GI panel sent; improving - AKI - stage 4 angioimmunoblastic T-cell lymphoma with chemo-induced pancytopenia   Labs reviewed; CBGs: 110 and 113 mg/dl, Ca: 7.8 mg/dl. Medications reviewed; sliding scale novolog, 20 mEq KCl/day.     NUTRITION - FOCUSED PHYSICAL EXAM:  unable to complete at this time.   Diet Order:   Diet Order            Diet heart healthy/carb modified Room service appropriate? Yes; Fluid consistency: Thin; Fluid restriction: 1500 mL Fluid  Diet effective now              EDUCATION NEEDS:   No education needs have been identified at this time  Skin:  Skin Assessment: Reviewed RN Assessment  Last BM:  12/21  Height:   Ht Readings from Last 1 Encounters:  10/10/19 6' (1.829 m)    Weight:   Wt Readings from Last 1 Encounters:  10/13/19 124.1 kg    Ideal Body Weight:  80.9 kg  BMI:  Body mass index is 37.12 kg/m.  Estimated Nutritional Needs:   Kcal:  2100-2300 kcal  Protein:  110-120 grams  Fluid:  >/= 2.1 L/day      Jarome Matin, MS, RD, LDN, [facility] Inpatient Clinical Dietitian Pager # [phone] After  hours/weekend pager # [phone]

## 2019-10-13 NOTE — Progress Notes (Addendum)
Progress Note    LOGAN BRANDFORD  [id number] DOB: November 22, 1940  DOA: 10/08/2019 PCP: Colon Branch, MD         Assessment/Plan:   Principal Problem:   Acute respiratory failure with hypoxia [facility]) Active Problems:   Hyperlipidemia associated with type 2 diabetes mellitus ([place])   Hypertension associated with diabetes ([place])   Diabetes mellitus, type II ([place])   Angioimmunoblastic lymphoma ([place])   Acute CHF (congestive heart failure) (H[facility])   Elevated troponin   Antineoplastic chemotherapy induced pancytopenia (HCC)   Body mass index is 37.8 kg/m.  Acute systolic heart failure: BNP is 1,106.6.  -Chest x-ray showed pulmonary edema. -Echocardiogram 09/12/2019 showed EF 60-65% with grade 1 diastolic dysfunction prior to beginning chemotherapy. -Repeat echocardiogram on 12/7 showed a drop in EF to 45 to 50% with mild hypokinesis of the left ventricle, grade 1 diastolic dysfunction  IV Lasix on hold because of persistent diarrhea.  Diarrhea: Patient continues to have diarrhea.  GI pathogen panel is pending.  Acute hypoxemic respiratory failure: Continue oxygen via nasal cannula and taper off oxygen as able  Elevated troponin: This is probably due to demand ischemia.  Continue aspirin.  Patient is followed by cardiologist.  AKI: Improving.  Hold losartan.  Continue Coreg.  Hypokalemia and hypomagnesemia: Improved  Stage IV angioimmunoblastic T-cell lymphoma: Outpatient follow-up with oncologist.  Consult palliative care team.    Family Communication/Anticipated D/C date and plan/Code Status   DVT prophylaxis: Heparin subcu Code Status: DNR Family Communication: Plan discussed with the patient  disposition Plan: Possible discharge to home in 2 days      Subjective:   C/o diarrhea.  He said he had 3 watery stools this morning.  Stools are nonbloody.  He has mild abdominal pain.  No vomiting.  He also reports slight increase in shortness of breath.  Objective:      Vitals:   10/12/19 1728 10/12/19 2109 10/13/19 0414 10/13/19 0815  BP: 140/72 133/74 (!) 149/68   Pulse: 60 62 63 70  Resp:  20 20   Temp:  97.9 F (36.6 C) 97.7 F (36.5 C)   TempSrc:      SpO2:  98% 95% 96%  Weight:      Height:       No intake or output data in the 24 hours ending 10/13/19 0939 Filed Weights   10/10/19 1840 10/11/19 0348  Weight: 125.7 kg 126.4 kg    Exam:  GEN: NAD SKIN: No rash EYES: EOMI ENT: MMM CV: RRR PULM: Mild wheezing but no rales heard. ABD: soft, obese, NT, +BS CNS: AAO x 3, non focal EXT: No edema or tenderness   Data Reviewed:   I have personally reviewed following labs and imaging studies:  Labs: Labs show the following:   Basic Metabolic Panel: Recent Labs  Lab 10/08/19 2211 10/09/19 0456 10/10/19 0500 10/11/19 0527 10/11/19 1520 10/12/19 0514 10/13/19 0436  NA 136  --  136 135  --  136  136 136  K 3.4*  --  3.4* 3.3*  --  3.5  3.5 4.0  CL 104  --  102 101  --  104  103 103  CO2 24  --  20* 23  --  22  23 23   GLUCOSE 138*  --  122* 119*  --  112*  111* 110*  BUN 14  --  27* 38*  --  30*  30* 20  CREATININE 1.09  --  1.82*  1.72*  --  1.08  1.19 1.10  CALCIUM 7.8*  --  7.5* 7.6*  --  7.6*  7.7* 7.8*  MG  --  1.6*  --   --  2.2 2.2  --   PHOS  --   --   --   --   --  2.8  --    GFR Estimated Creatinine Clearance: 77.2 mL/min (by C-G formula based on SCr of 1.1 mg/dL). Liver Function Tests: Recent Labs  Lab 10/08/19 2211 10/12/19 0514  AST 13*  --   ALT 14  --   ALKPHOS 69  --   BILITOT 1.4*  --   PROT 6.2*  --   ALBUMIN 2.8* 2.7*   No results for input(s): LIPASE, AMYLASE in the last 168 hours. No results for input(s): AMMONIA in the last 168 hours. Coagulation profile No results for input(s): INR, PROTIME in the last 168 hours.  CBC: Recent Labs  Lab 10/08/19 2211 10/09/19 0456 10/12/19 0514  WBC 0.3* 0.3* 3.4*  NEUTROABS 0.1* 0.1* 2.4  HGB 8.9* 9.0* 8.6*  HCT 27.4* 27.1* 27.2*  MCV  85.4 85.2 86.6  PLT 83* 98* 200   Cardiac Enzymes: No results for input(s): CKTOTAL, CKMB, CKMBINDEX, TROPONINI in the last 168 hours. BNP (last 3 results) Recent Labs    09/23/19 1714  PROBNP 115.0*   CBG: Recent Labs  Lab 10/12/19 0831 10/12/19 1136 10/12/19 1636 10/12/19 2129 10/13/19 0814  GLUCAP 102* 102* 102* 110* 110*   D-Dimer: No results for input(s): DDIMER in the last 72 hours. Hgb A1c: No results for input(s): HGBA1C in the last 72 hours. Lipid Profile: No results for input(s): CHOL, HDL, LDLCALC, TRIG, CHOLHDL, LDLDIRECT in the last 72 hours. Thyroid function studies: No results for input(s): TSH, T4TOTAL, T3FREE, THYROIDAB in the last 72 hours.  Invalid input(s): FREET3 Anemia work up: No results for input(s): VITAMINB12, FOLATE, FERRITIN, TIBC, IRON, RETICCTPCT in the last 72 hours. Sepsis Labs: Recent Labs  Lab 10/08/19 2211 10/09/19 0456 10/12/19 0514  WBC 0.3* 0.3* 3.4*    Microbiology Recent Results (from the past 240 hour(s))  Culture, Urine     Status: Abnormal   Collection Time: 10/06/19 12:04 PM   Specimen: Urine, Clean Catch  Result Value Ref Range Status   Specimen Description   Final    URINE, CLEAN CATCH Performed at [facility] at [facility], [address], [place], [place] [address]    Special Requests   Final    NONE Performed at [facility] at [facility], [address] [place], [place] [address]    Culture (A)  Final    <10,000 COLONIES/mL INSIGNIFICANT GROWTH Performed at [place] [address], [place], [place] [address]    Report Status 10/07/2019 FINAL  Final  SARS CORONAVIRUS 2 (TAT 6-24 HRS) Nasopharyngeal Nasopharyngeal Swab     Status: None   Collection Time: 10/09/19 12:23 AM   Specimen: Nasopharyngeal Swab  Result Value Ref Range Status   SARS Coronavirus 2 NEGATIVE NEGATIVE Final    Comment: (NOTE) SARS-CoV-2 target nucleic acids are NOT  DETECTED. The SARS-CoV-2 RNA is generally detectable in upper and lower respiratory specimens during the acute phase of infection. Negative results do not preclude SARS-CoV-2 infection, do not rule out co-infections with other pathogens, and should not be used as the sole basis for treatment or other patient management decisions. Negative results must be  combined with clinical observations, patient history, and epidemiological information. The expected result is Negative. Fact Sheet for Patients: SugarRoll.be Fact Sheet for Healthcare Providers: https://www.woods-mathews.com/ This test is not yet approved or cleared by the [place] FDA and  has been authorized for detection and/or diagnosis of SARS-CoV-2 by FDA under an Emergency Use Authorization (EUA). This EUA will remain  in effect (meaning this test can be used) for the duration of the COVID-19 declaration under Section 56 4(b)(1) of the Act, 21 U.S.C. section 360bbb-3(b)(1), unless the authorization is terminated or revoked sooner. Performed at [facility], [place] [address], [place], [place] [address]   Urine culture     Status: Abnormal   Collection Time: 10/09/19  4:07 AM   Specimen: Urine, Random  Result Value Ref Range Status   Specimen Description   Final    URINE, RANDOM Performed at [facility], [place] [address], [place], [place] [address]    Special Requests   Final    NONE Performed at [facility], [place] [address] [place], [place] [address]    Culture (A)  Final    <10,000 COLONIES/mL INSIGNIFICANT GROWTH Performed at [place] [address] [place], [place] [address]    Report Status 10/10/2019 FINAL  Final  C difficile quick scan w PCR reflex     Status: None   Collection Time: 10/09/19  8:29 AM   Specimen: STOOL  Result Value Ref Range Status   C Diff antigen NEGATIVE NEGATIVE Final   C Diff toxin NEGATIVE NEGATIVE  Final   C Diff interpretation No C. difficile detected.  Final    Comment: Performed at [facility], [place] [address], [place], [place] [address]    Procedures and diagnostic studies:  No results found.  Medications:   . aspirin EC  81 mg Oral Daily  . atorvastatin  40 mg Oral q1800  . carvedilol  12.5 mg Oral BID WC  . Chlorhexidine Gluconate Cloth  6 each Topical Daily  . insulin aspart  0-9 Units Subcutaneous TID WC  . losartan  25 mg Oral Daily  . potassium chloride  20 mEq Oral Daily   Continuous Infusions:   LOS: 4 days   Sitlali Koerner  [facility]   *Please refer to [email or web address], password [email or web address] to get updated schedule on who will round on this patient, as hospitalists switch teams weekly. If 7PM-7AM, please contact night-coverage at [email or web address], password [email or web address] for any overnight needs.  10/13/2019, 9:39 AM

## 2019-10-13 NOTE — Progress Notes (Signed)
Occupational Therapy Treatment Patient Details Name: [name] [name] MRN: [id number] DOB: [birth year] Today's Date: 10/13/2019    History of present illness 78 yo male admitted to ED on 12/16 with progress weakness, diarrhea, syncopal episode, and respiratory failure. 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 PMH includes OA with history of bilateral TKR and R shoulder arthroplasty, DM, GERD, HTN, lumbar fusion, myasthenia gravis L eye.   OT comments  Patient making progress today with transfers, use of AE, and standing for grooming. Upon entering patient had O2 on when asked about it ("they just put it on me to see if that would make me feel better). Sats on RA at rest 99 and post activity 96--left pt on RA with O2 laying next to him in case he felt like he needed it.(he was agreeable)  Follow Up Recommendations  Home health OT;Supervision - Intermittent    Equipment Recommendations  None recommended by OT       Precautions / Restrictions Precautions Precautions: Fall Restrictions Weight Bearing Restrictions: No       Mobility Bed Mobility Overal bed mobility: Needs Assistance Bed Mobility: Supine to Sit;Sit to Supine     Supine to sit: Supervision Sit to supine: Supervision      Transfers   Equipment used: Rolling walker (2 wheeled) Transfers: Sit to/from Stand Sit to Stand: Supervision         General transfer comment: Min guard A to [employer] around room with RW    Balance Overall balance assessment: Mild deficits observed, not formally tested                                         ADL either performed or assessed with clinical judgement   ADL Overall ADL's : Needs assistance/impaired     Grooming: Min guard;Standing;Wash/dry face                 Lower Body Dressing Details (indicate cue type and reason): Patient instructed in how to use reacher to doff socks, then she showed how he uses sock aid at home to donn  socks                     Vision Patient Visual Report: No change from baseline            Cognition Arousal/Alertness: Awake/alert Behavior During Therapy: WFL for tasks assessed/performed Overall Cognitive Status: Within Functional Limits for tasks assessed                                                     Pertinent Vitals/ Pain       Pain Assessment: No/denies pain     Prior Functioning/Environment              Frequency  Min 2X/week        Progress Toward Goals  OT Goals(current goals can now be found in the care plan section)  Progress towards OT goals: Progressing toward goals     Plan Discharge plan remains appropriate       AM-PAC OT "6 Clicks" Daily Activity     Outcome Measure   Help from another person eating meals?: None Help from another person taking care  of personal grooming?: A Little Help from another person toileting, which includes using toliet, bedpan, or urinal?: A Little Help from another person bathing (including washing, rinsing, drying)?: A Little Help from another person to put on and taking off regular upper body clothing?: A Little Help from another person to put on and taking off regular lower body clothing?: A Little 6 Click Score: 19    End of Session Equipment Utilized During Treatment: Gait belt;Rolling walker  OT Visit Diagnosis: Unsteadiness on feet (R26.81);Other abnormalities of gait and mobility (R26.89);Muscle weakness (generalized) (M62.81)   Activity Tolerance Patient tolerated treatment well   Patient Left in bed;with call bell/phone within reach   Nurse Communication          Time: [id number] OT Time Calculation (min): 33 min  Charges: OT General Charges $OT Visit: 1 Visit OT Treatments $Self Care/Home Management : 23-37 mins  [facility] OTR/L [place] Pager [phone] Office [phone]      10/13/2019, 4:35 PM

## 2019-10-14 ENCOUNTER — Ambulatory Visit: Payer: HMO

## 2019-10-14 DIAGNOSIS — Z515 Encounter for palliative care: Secondary | ICD-10-CM

## 2019-10-14 DIAGNOSIS — I10 Essential (primary) hypertension: Secondary | ICD-10-CM

## 2019-10-14 DIAGNOSIS — Z7189 Other specified counseling: Secondary | ICD-10-CM

## 2019-10-14 DIAGNOSIS — E1159 Type 2 diabetes mellitus with other circulatory complications: Secondary | ICD-10-CM

## 2019-10-14 LAB — LIPID PANEL
Cholesterol: 88 mg/dL (ref 0–200)
HDL: 25 mg/dL — ABNORMAL LOW (ref >40)
LDL Cholesterol: 39 mg/dL (ref 0–99)
Total CHOL/HDL Ratio: 3.5 RATIO
Triglycerides: 122 mg/dL (ref <150)
VLDL: 24 mg/dL (ref 0–40)

## 2019-10-14 LAB — BASIC METABOLIC PANEL
Anion gap: 9 (ref 5–15)
BUN: 14 mg/dL (ref 8–23)
CO2: 21 mmol/L — ABNORMAL LOW (ref 22–32)
Calcium: 7.6 mg/dL — ABNORMAL LOW (ref 8.9–10.3)
Chloride: 105 mmol/L (ref 98–111)
Creatinine, Ser: 0.97 mg/dL (ref 0.61–1.24)
GFR calc Af Amer: >60 mL/min (ref >60)
GFR calc non Af Amer: >60 mL/min (ref >60)
Glucose, Bld: 113 mg/dL — ABNORMAL HIGH (ref 70–99)
Potassium: 3.8 mmol/L (ref 3.5–5.1)
Sodium: 135 mmol/L (ref 135–145)

## 2019-10-14 LAB — GI PATHOGEN PANEL BY PCR, STOOL

## 2019-10-14 LAB — GLUCOSE, CAPILLARY
Glucose-Capillary: 99 mg/dL (ref 70–99)
Glucose-Capillary: 119 mg/dL — ABNORMAL HIGH (ref 70–99)
Glucose-Capillary: 102 mg/dL — ABNORMAL HIGH (ref 70–99)

## 2019-10-14 MED ORDER — LOSARTAN POTASSIUM 50 MG PO TABS
50.0000 mg | ORAL_TABLET | Freq: Every day | ORAL | Status: DC
  Administered 2019-10-15 – 2019-10-18 (×4): 50 mg via ORAL
  Filled 2019-10-14 (×4): qty 1

## 2019-10-14 MED ORDER — POTASSIUM CHLORIDE CRYS ER 20 MEQ PO TBCR
20.0000 meq | EXTENDED_RELEASE_TABLET | Freq: Every day | ORAL | Status: DC
  Administered 2019-10-14 – 2019-10-15 (×2): 20 meq via ORAL
  Filled 2019-10-14 (×2): qty 1

## 2019-10-14 NOTE — Progress Notes (Addendum)
Progress Note  Patient Name: [name] [name] Date of Encounter: 10/14/2019  Primary Cardiologist: Mertie Moores, MD  Subjective   Feeling better overall today. Less SOB and wheezing. Diarrhea has slowed.  Inpatient Medications    Scheduled Meds: . aspirin EC  81 mg Oral Daily  . atorvastatin  40 mg Oral q1800  . carvedilol  12.5 mg Oral BID WC  . feeding supplement (ENSURE ENLIVE)  237 mL Oral BID BM  . heparin injection (subcutaneous)  5,000 Units Subcutaneous Q8H  . insulin aspart  0-9 Units Subcutaneous TID WC  . losartan  25 mg Oral Daily  . multivitamin with minerals  1 tablet Oral Daily  . potassium chloride  20 mEq Oral Daily   Continuous Infusions:  PRN Meds: acetaminophen, ondansetron (ZOFRAN) IV   Vital Signs    Vitals:   10/13/19 1157 10/13/19 2136 10/14/19 0500 10/14/19 0615  BP: (!) 146/74 (!) 153/64  (!) 172/86  Pulse: (!) 57 60  64  Resp: 17 20  20   Temp: 97.6 F (36.4 C) 98.3 F (36.8 C)  98.6 F (37 C)  TempSrc: Oral   Oral  SpO2: 100% 98%  95%  Weight: 124.1 kg  125.4 kg   Height:        Intake/Output Summary (Last 24 hours) at 10/14/2019 0852 Last data filed at 10/13/2019 1800 Gross per 24 hour  Intake 525 ml  Output -  Net 525 ml   Last 3 Weights 10/14/2019 10/13/2019 10/11/2019  Weight (lbs) 276 lb 7.3 oz 273 lb 11.2 oz 278 lb 11.2 oz  Weight (kg) 125.4 kg 124.15 kg 126.417 kg     Telemetry    NSR with occasional PVCs - Personally Reviewed  Physical Exam   GEN: No acute distress.  HEENT: Normocephalic, atraumatic, sclera non-icteric. Neck: No JVD or bruits. Cardiac: RRR no murmurs, rubs, or gallops.  Radials/DP/PT 1+ and equal bilaterally.  Respiratory: Clear to auscultation bilaterally. Breathing is unlabored. GI: Soft, nontender, non-distended, BS +x 4. MS: no deformity. Extremities: No clubbing or cyanosis. Trace edema which patient states is chronic. Distal pedal pulses are 2+ and equal bilaterally. Neuro:  AAOx3.  Follows commands. Psych:  Responds to questions appropriately with a normal affect.  Labs    High Sensitivity Troponin:   Recent Labs  Lab 10/08/19 2211 10/09/19 0011  TROPONINIHS 869* 862*      Cardiac EnzymesNo results for input(s): TROPONINI in the last 168 hours. No results for input(s): TROPIPOC in the last 168 hours.   Chemistry Recent Labs  Lab 10/08/19 2211 10/12/19 0514 10/13/19 0436 10/14/19 0526  NA 136 136  136 136 135  K 3.4* 3.5  3.5 4.0 3.8  CL 104 104  103 103 105  CO2 24 22  23 23  21*  GLUCOSE 138* 112*  111* 110* 113*  BUN 14 30*  30* 20 14  CREATININE 1.09 1.08  1.19 1.10 0.97  CALCIUM 7.8* 7.6*  7.7* 7.8* 7.6*  PROT 6.2*  --   --   --   ALBUMIN 2.8* 2.7*  --   --   AST 13*  --   --   --   ALT 14  --   --   --   ALKPHOS 69  --   --   --   BILITOT 1.4*  --   --   --   GFRNONAA >60 >60  59* >60 >60  GFRAA >60 >60  >60 >60 >60  ANIONGAP  8 10  10 10 9      Hematology Recent Labs  Lab 10/09/19 0456 10/12/19 0514 10/13/19 1016  WBC 0.3* 3.4* 4.8  RBC 3.18* 3.14* 3.35*  HGB 9.0* 8.6* 9.2*  HCT 27.1* 27.2* 29.2*  MCV 85.2 86.6 87.2  MCH 28.3 27.4 27.5  MCHC 33.2 31.6 31.5  RDW 14.8 14.9 14.9  PLT 98* 200 308    BNP Recent Labs  Lab 10/08/19 2258 10/13/19 1016  BNP 1,106.6* 831.8*     DDimer No results for input(s): DDIMER in the last 168 hours.   Radiology    No results found.  Cardiac Studies   TTE 10/09/19: 1. Left ventricular ejection fraction, by visual estimation, is 45 to 50%. The left ventricle has mildly decreased function. There is no left ventricular hypertrophy. 2. Mild hypokinesis of the left ventricular, mid-apical anterior wall and apical segment. 3. Definity contrast agent was given IV to delineate the left ventricular endocardial borders. 4. Left ventricular diastolic parameters are consistent with Grade I diastolic dysfunction (impaired relaxation). 5. Mild to moderately dilated left ventricular  internal cavity size. 6. The left ventricle demonstrates regional wall motion abnormalities. 7. Global right ventricle has low normal systolic function.The right ventricular size is normal. No increase in right ventricular wall thickness. 8. Left atrial size was normal. 9. Right atrial size was normal. 10. The mitral valve is grossly normal. Trivial mitral valve regurgitation. 11. The tricuspid valve is grossly normal. Tricuspid valve regurgitation is mild. 12. The aortic valve is tricuspid. Aortic valve regurgitation is trivial. Mild aortic valve sclerosis without stenosis. 13. The pulmonic valve was grossly normal. Pulmonic valve regurgitation is not visualized. 14. Aortic dilatation noted. 15. 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 16. Mildly elevated pulmonary artery systolic pressure. 17. The inferior vena cava is dilated in size with >50% respiratory variability, suggesting right atrial pressure of 8 mmHg.  TTE 09/12/19: 1. Left ventricular ejection fraction, by visual estimation, is 60 to 65%. The left ventricle has normal function. There is moderately increased left ventricular hypertrophy. 2. Definity contrast agent was given IV to delineate the left ventricular endocardial borders. 3. Left ventricular diastolic parameters are consistent with Grade I diastolic dysfunction (impaired relaxation). 4. The tricuspid valve is normal in structure. Tricuspid valve regurgitation is trivial. 5. The aortic valve is normal in structure. Aortic valve regurgitation is trivial. No evidence of aortic valve sclerosis or stenosis. 6. There is mild dilatation of the ascending aorta measuring 39 mm.   Patient Profile     78 y.o. 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  Assessment & Plan    1. Acute systolic CHF - baseline normal systolic function 123456, with repeat 10/09/19 showing new mid-apical anterior WMA with EF 45-50%.  He had started cycle 1 of chemotherapy on 09/29/19 (doxorubicin, Cytoxan, Adcetris). Worsening systolic function is concerning for chemotoxicity. He presented with volume overload, BNP 1100. CXR showed patchy airspace opacities with layering bilateral effusions. Low albumin (2.8) likely also contributing to volume overload. He is now on carvedilol and losartan. BP beginning to creep up. He received 3 doses of IV Lasix on admission - further diuretics have been on hold due to diarrhea and AKI, the latter of which resolved after holding Lasix. Weight  without significant change from admit. I/Os unreliable as UOP has been documented in occurrences instead of mLs (patient has had frequent diarrhea). Now normal sat on room air and feeling overall a bit better. Reviewed 2g sodium restriction, 2L fluid restriction, daily weights with patient. Will review plan with MD. May also need to involve oncology as to how this relates to plans for future chemotherapy - 6 cycles were originally planned per onc note 10/06/19.   2. Elevated troponin - Troponin peak 869. No chest pain or ST changes on ECG to suggest ACS. Continue ASA as heme parameters allow. Continue statin, BB. LDL at goal. Prior plan is to consider Lexiscan nuc as outpatient, will need clarification on timing closer to discharge. With comorbidities and issues with pancytopenia and renal insufficiency at times, conservative therapy may also be a consideration. (hsTroponin equates to roughly 0.8 by old lab value.)  3. Acute kidney injury - very labile recently, as high as 3.58 on 12/8. Peak was 1.82 this admission, remains normal at this time.  4. Stage IV angioimmunoblastic T-cell lymphoma  withchemotherapy-induced pancytopenia - per oncology, s/p recent chemo.  5. HLD - continue statin.  6. Mild dilation of ascending aorta - given comorbidities, conservative management likely appropriate. Will likely be followed anyway on future echocardiograms.  For questions or updates, please contact [place] Please consult [email or web address] for contact info under Cardiology/STEMI.  Signed, Charlie Pitter, PA-C 10/14/2019, 8:52 AM    Patient seen and examined.  Agree with above documentation.  On exam, patient is alert and oriented, regular rate and rhythm, no murmurs, lungs CTAB, trace LE edema, no JVD.  Reports he continues to have persistent diarrhea.  Dyspnea unchanged.  Given his ongoing GI fluid losses, and considering AKI developed when attempted diuresis, would hold off on diuresis while having diarrhea.  BP elevated, will increase losartan to 50 mg daily.  Planning outpatient ischemia evaluation with [facility].  Donato Heinz, MD

## 2019-10-14 NOTE — Consult Note (Signed)
Consultation Note Date: 10/14/2019   Patient Name: [name] [name]  DOB: 07-16-1941  MRN: [id number]  Age / Sex: 78 y.o., male  PCP: Colon Branch, MD Referring Physician: Jennye Boroughs, MD  Reason for Consultation: Establishing goals of care  HPI/Patient Profile: 78 y.o. male admitted on 10/08/2019    Clinical Assessment and Goals of Care:  78 yo gentleman who lives at home with his wife, has history of Stage IV angioimmunoblastic T-cell lymphoma and sees Dr. Maylon Peppers in the outpatient setting.   Patient admitted with acute systolic CHF, patient on chemotherapy, also admitted with AKI, diarrhea.   Palliative consult for goals of care.   [name] is resting in bed. He is awake alert, in good spirits. He answers all questions appropriately and participates in his own goals of care discussions.   Palliative medicine is specialized medical care for people living with serious illness. It focuses on providing relief from the symptoms and stress of a serious illness. The goal is to improve quality of life for both the patient and the family.  Goals of care: Broad aims of medical therapy in relation to the patient's values and preferences. Our aim is to provide medical care aimed at enabling patients to achieve the goals that matter most to them, given the circumstances of their particular medical situation and their constraints.   [name] denies chest pain, shortness of breath is improved. He is in no distress, diarrhea is better. How ever,patient still feels dizzy upon standing. He is aware of the scope of his current hospitalization.   Goals, wishes and values important to the patient attempted to be explored. Patient hopes for ongoing stabilization/recovery, hopes to go back home towards the end of this hospitalization.   See below.   NEXT OF KIN  lives at home with wife.   SUMMARY OF RECOMMENDATIONS    Agree with DNR Continue current mode of care Recommend home with outpatient palliative on discharge.  Thank you for the consult.   Code Status/Advance Care Planning:  DNR    Symptom Management:    as above.   Palliative Prophylaxis:   Delirium Protocol   Psycho-social/Spiritual:   Desire for further Chaplaincy support:yes  Additional Recommendations: Caregiving  Support/Resources  Prognosis:   Unable to determine  Discharge Planning: Home with Palliative Services      Primary Diagnoses: Present on Admission: . Acute respiratory failure with hypoxia ([place]) . Angioimmunoblastic lymphoma ([place]) . Hypertension associated with diabetes ([place]) . Hyperlipidemia associated with type 2 diabetes mellitus ([place])   I have reviewed the medical record, interviewed the patient and family, and examined the patient. The following aspects are pertinent.  Past Medical History:  Diagnosis Date  . Arthritis   . Complication of anesthesia     had spinal with knee surgeries  -"heart rate too low" for general  . Detached retina, left    L eye normal vision, reports that he has had a retina procedure in a doctor's office at [facility]   . Diabetes  mellitus without complication ([place])   . Dysrhythmia    slight irregular rate  . GERD (gastroesophageal reflux disease)    hx no problems now  . H/O echocardiogram 2014  . History of hiatal hernia    ?20 yrs ago  . Hyperopia 2016  . Hypertension   . Lumbar stenosis with neurogenic claudication   . MG, ocular (myasthenia gravis) ([place])    ? of L eye,   . Prediabetes   . Presbyopia OU 2016   Social History   Socioeconomic History  . Marital status: Married    Spouse name: Not on file  . Number of children: 4  . Years of education: Not on file  . Highest education level: Not on file  Occupational History  . Occupation: Audiological scientist-- retired 2010  Tobacco Use  . Smoking status: Former Smoker    Packs/day: 0.50    Years: 2.00     Pack years: 1.00    Types: Cigarettes    Quit date: 06/09/1966    Years since quitting: 53.3  . Smokeless tobacco: Never Used  Substance and Sexual Activity  . Alcohol use: No    Alcohol/week: 0.0 standard drinks    Comment: none  . Drug use: No  . Sexual activity: Yes    Partners: Female  Other Topics Concern  . Not on file  Social History Narrative   married, lives w/ second wife, 2 living children, lost 2 kids     Has a small dog.     Social Determinants of Health   Financial Resource Strain:   . Difficulty of Paying Living Expenses: Not on file  Food Insecurity:   . Worried About Charity fundraiser in the Last Year: Not on file  . Ran Out of Food in the Last Year: Not on file  Transportation Needs:   . Lack of Transportation (Medical): Not on file  . Lack of Transportation (Non-Medical): Not on file  Physical Activity:   . Days of Exercise per Week: Not on file  . Minutes of Exercise per Session: Not on file  Stress:   . Feeling of Stress : Not on file  Social Connections:   . Frequency of Communication with Friends and Family: Not on file  . Frequency of Social Gatherings with Friends and Family: Not on file  . Attends Religious Services: Not on file  . Active Member of Clubs or Organizations: Not on file  . Attends Archivist Meetings: Not on file  . Marital Status: Not on file   Family History  Problem Relation Age of Onset  . Diabetes Brother   . Colon cancer Brother 17       dx in 2018  . Hypertension Sister   . Prostate cancer Neg Hx   . Stroke Neg Hx   . CAD Neg Hx    Scheduled Meds: . aspirin EC  81 mg Oral Daily  . atorvastatin  40 mg Oral q1800  . carvedilol  12.5 mg Oral BID WC  . feeding supplement (ENSURE ENLIVE)  237 mL Oral BID BM  . heparin injection (subcutaneous)  5,000 Units Subcutaneous Q8H  . insulin aspart  0-9 Units Subcutaneous TID WC  . [START ON 10/15/2019] losartan  50 mg Oral Daily  . multivitamin with minerals  1  tablet Oral Daily  . potassium chloride  20 mEq Oral Daily   Continuous Infusions: PRN Meds:.acetaminophen, ondansetron (ZOFRAN) IV Medications Prior to Admission:  Prior to Admission  medications   Medication Sig Start Date End Date Taking? Authorizing Provider  acetaminophen (TYLENOL) 500 MG tablet Take 500 mg by mouth every 6 (six) hours as needed for moderate pain.   Yes [provider]  allopurinol (ZYLOPRIM) 300 MG tablet Take 1 tablet (300 mg total) by mouth daily. 09/30/19  Yes Tish Men, MD  aspirin 81 MG tablet Take 81 mg by mouth daily.   Yes [provider]  aspirin-sod bicarb-citric acid (ALKA-SELTZER) 325 MG TBEF tablet Take 325 mg by mouth every 6 (six) hours as needed (Heart Burn).   Yes [provider]  carvedilol (COREG) 12.5 MG tablet Take 1 tablet (12.5 mg total) by mouth 2 (two) times daily with a meal. 06/20/19  Yes [name], [name], MD  HYDROcodone-acetaminophen (NORCO) 7.5-325 MG tablet Take 1 tablet by mouth 3 (three) times daily as needed for moderate pain. 10/08/19  Yes [name], [name], MD  LORazepam (ATIVAN) 0.5 MG tablet Take 1 tablet (0.5 mg total) by mouth every 6 (six) hours as needed (Nausea or vomiting). 10/08/19  Yes Tish Men, MD  losartan (COZAAR) 100 MG tablet Take 1 tablet (100 mg total) by mouth daily. 05/15/19  Yes Colon Branch, MD  metFORMIN (GLUCOPHAGE) 500 MG tablet Take 1 tablet (500 mg total) by mouth daily with breakfast. 10/07/19  Yes [name], [name], MD  ondansetron (ZOFRAN) 8 MG tablet Take 1 tablet (8 mg total) by mouth 2 (two) times daily as needed for refractory nausea / vomiting. Start on day 3 after cyclophosphamide chemotherapy. 09/30/19  Yes Tish Men, MD  potassium chloride (KLOR-CON M10) 10 MEQ tablet Take 1 tablet (10 mEq total) by mouth daily. 05/15/19  Yes [name], [name], MD  prochlorperazine (COMPAZINE) 10 MG tablet Take 1 tablet (10 mg total) by mouth every 6 (six) hours as needed (Nausea or vomiting). 09/30/19  Yes Tish Men, MD    protein supplement shake (PREMIER PROTEIN) LIQD Take 2 oz by mouth daily.   Yes [provider]  simvastatin (ZOCOR) 80 MG tablet Take 0.5 tablets (40 mg total) by mouth daily. 10/07/18  Yes [name], [name], MD  chlorthalidone (HYGROTON) 25 MG tablet Take 0.5 tablets (12.5 mg total) by mouth daily. Patient not taking: Reported on 10/09/2019 07/16/19   Colon Branch, MD  ketoconazole (NIZORAL) 2 % cream Apply 1 application topically daily. Patient not taking: Reported on 10/09/2019 05/28/19   Colon Branch, MD  Nutritional Supplements (GLUCERNA 1.0 CAL/CARBSTEADY) LIQD Take 1 Can by mouth daily. Okay to drink up to 3 cans/bottles daily to replace meals as needed. Patient not taking: Reported on 10/09/2019 09/23/19   Colon Branch, MD  omeprazole (PRILOSEC) 20 MG capsule Take 1 capsule (20 mg total) by mouth daily. Patient not taking: Reported on 10/09/2019 10/02/16   Colon Branch, MD   Allergies  Allergen Reactions  . Doxycycline Rash   Review of Systems Mild shortness of breath Mild dizziness  Physical Exam Awake alert Resting in bed No distress Regular breath sounds Abdomen not distended Trace edema S1 S2 Non focal  Pleasant and cooperative.   Vital Signs: BP (!) 151/70 (BP Location: Left Arm)   Pulse 68   Temp 99.1 F (37.3 C)   Resp 19   Ht 6' (1.829 m)   Wt 125.4 kg   SpO2 96%   BMI 37.49 kg/m  Pain Scale: 0-10   Pain Score: 0-No pain   SpO2: SpO2: 96 % O2 Device:SpO2: 96 %  O2 Flow Rate: .O2 Flow Rate (L/min): 2 L/min  IO: Intake/output summary:   Intake/Output Summary (Last 24 hours) at 10/14/2019 1322 Last data filed at 10/14/2019 [id number] Gross per 24 hour  Intake 495 ml  Output --  Net 495 ml    LBM: Last BM Date: 10/14/19 Baseline Weight: Weight: 125.7 kg Most recent weight: Weight: 125.4 kg     Palliative Assessment/Data:   Flowsheet Rows     Most Recent Value  Intake Tab  Referral Department  Hospitalist  Unit at Time of Referral  Med/Surg Unit   Palliative Care Primary Diagnosis  Cardiac  Palliative Care Type  New Palliative care  Reason for referral  Clarify Goals of Care  Date first seen by Palliative Care  10/14/19  Clinical Assessment  Palliative Performance Scale Score  40%  Pain Max last 24 hours  4  Pain Min Last 24 hours  3  Dyspnea Max Last 24 Hours  4  Dyspnea Min Last 24 hours  3  Nausea Max Last 24 Hours  2  Nausea Min Last 24 Hours  1  Anxiety Max Last 24 Hours  2  Anxiety Min Last 24 Hours  1  Psychosocial & Spiritual Assessment  Palliative Care Outcomes      Time In:  10 Time Out:  11 Time Total:  60 Greater than 50%  of this time was spent counseling and coordinating care related to the above assessment and plan.  Signed by: Loistine Chance, MD   Please contact Palliative Medicine Team phone at [phone] for questions and concerns.  For individual provider: See Shea Evans

## 2019-10-14 NOTE — Progress Notes (Signed)
PT Cancellation Note  Patient Details Name: [name] [name] MRN: [id number] DOB: Apr 19, 1941   Cancelled Treatment:    Reason Eval/Treat Not Completed: Other (comment) Pt declined PT due to frequent diarrhea and did not feel like moving.  Will follow up as able.  Abran Richard, PT Acute Rehab Services Pager [phone] [facility] [place]   [place], [place] 10/14/2019, 4:08 PM

## 2019-10-14 NOTE — Progress Notes (Addendum)
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  Acute systolic heart failure: BNP is 1,106.6.  -Chest x-ray showed pulmonary edema. -Echocardiogram 09/12/2019 showed EF 60-65% with grade 1 diastolic dysfunction prior to beginning chemotherapy. -Repeat echocardiogram on 12/17 showed a drop in EF to 45 to 50% with mild hypokinesis of the left ventricle, grade 1 diastolic dysfunction Case was discussed with cardiologist, Dr. Darcel Bayley.  No diuretics for now given ongoing diarrhea.  Diarrhea: Slowly improving.  Etiology is unclear.  Stool for C. difficile toxin was negative.  GI pathogen panel is pending.  Acute hypoxemic respiratory failure: Resolved.   Elevated troponin: This is probably due to demand ischemia.  Continue aspirin.  Patient is followed by cardiologist.  AKI: Improved.  Hypertension: Continue Coreg.  Resume losartan tomorrow  Hypokalemia and hypomagnesemia: Improved  Stage IV angioimmunoblastic T-cell lymphoma: Outpatient follow-up with oncologist.  Consulted palliative care team.    Family Communication/Anticipated D/C date and plan/Code Status   DVT prophylaxis: Heparin subcu Code Status: DNR Family Communication: Plan discussed with the patient  disposition Plan: Possible discharge to home in 1 to 2 days      Subjective:   He still has watery diarrhea but it is better.  He had 3 watery stools this morning but volume was less than  yesterday.  His breathing is better.  No wheezing, chest pain or shortness of breath.  Objective:    Vitals:   10/13/19 2136 10/14/19 0500 10/14/19 0615 10/14/19 1150  BP: (!) 153/64  (!) 172/86 (!) 151/70  Pulse: 60  64 68  Resp: 20  20 19   Temp: 98.3 F (36.8 C)  98.6 F (37 C) 99.1 F (37.3 C)  TempSrc:   Oral   SpO2: 98%  95% 96%  Weight:  125.4 kg    Height:        Intake/Output Summary (Last 24 hours) at 10/14/2019 1307 Last data filed at 10/14/2019 0904 Gross per 24 hour  Intake 495 ml  Output --  Net 495 ml   Filed Weights   10/11/19 0348 10/13/19 1157 10/14/19 0500  Weight: 126.4 kg 124.1 kg 125.4 kg    Exam:  GEN: NAD SKIN: No rash EYES: EOMI ENT: MMM CV: RRR PULM: CTA B ABD: soft, obese, NT, +BS CNS: AAO x 3, non focal EXT: No edema or tenderness    Data Reviewed:   I have personally reviewed following labs and imaging studies:  Labs: Labs show the following:   Basic Metabolic Panel: Recent Labs  Lab 10/09/19 0456 10/10/19 0500 10/11/19 0527 10/11/19 1520 10/12/19 0514 10/13/19 0436 10/14/19 0526  NA  --  136 135  --  136  136 136 135  K  --  3.4* 3.3*  --  3.5  3.5 4.0 3.8  CL  --  102 101  --  104  103 103 105  CO2  --  20* 23  --  22  23  23 21*  GLUCOSE  --  122* 119*  --  112*  111* 110* 113*  BUN  --  27* 38*  --  30*  30* 20 14  CREATININE  --  1.82* 1.72*  --  1.08  1.19 1.10 0.97  CALCIUM  --  7.5* 7.6*  --  7.6*  7.7* 7.8* 7.6*  MG 1.6*  --   --  2.2 2.2  --   --   PHOS  --   --   --   --  2.8  --   --    GFR Estimated Creatinine Clearance: 87.2 mL/min (by C-G formula based on SCr of 0.97 mg/dL). Liver Function Tests: Recent Labs  Lab 10/08/19 2211 10/12/19 0514  AST 13*  --   ALT 14  --   ALKPHOS 69  --   BILITOT 1.4*  --   PROT 6.2*  --   ALBUMIN 2.8* 2.7*   No results for input(s): LIPASE, AMYLASE in the last 168 hours. No results for input(s): AMMONIA in the last 168 hours. Coagulation  profile No results for input(s): INR, PROTIME in the last 168 hours.  CBC: Recent Labs  Lab 10/08/19 2211 10/09/19 0456 10/12/19 0514 10/13/19 1016  WBC 0.3* 0.3* 3.4* 4.8  NEUTROABS 0.1* 0.1* 2.4  --   HGB 8.9* 9.0* 8.6* 9.2*  HCT 27.4* 27.1* 27.2* 29.2*  MCV 85.4 85.2 86.6 87.2  PLT 83* 98* 200 308   Cardiac Enzymes: No results for input(s): CKTOTAL, CKMB, CKMBINDEX, TROPONINI in the last 168 hours. BNP (last 3 results) Recent Labs    09/23/19 1714  PROBNP 115.0*   CBG: Recent Labs  Lab 10/13/19 1154 10/13/19 1619 10/13/19 2137 10/14/19 0810 10/14/19 1149  GLUCAP 113* 110* 114* 99 119*   D-Dimer: No results for input(s): DDIMER in the last 72 hours. Hgb A1c: No results for input(s): HGBA1C in the last 72 hours. Lipid Profile: Recent Labs    10/14/19 0526  CHOL 88  HDL 25*  LDLCALC 39  TRIG 122  CHOLHDL 3.5   Thyroid function studies: No results for input(s): TSH, T4TOTAL, T3FREE, THYROIDAB in the last 72 hours.  Invalid input(s): FREET3 Anemia work up: No results for input(s): VITAMINB12, FOLATE, FERRITIN, TIBC, IRON, RETICCTPCT in the last 72 hours. Sepsis Labs: Recent Labs  Lab 10/08/19 2211 10/09/19 0456 10/12/19 0514 10/13/19 1016  WBC 0.3* 0.3* 3.4* 4.8    Microbiology Recent Results (from the past 240 hour(s))  Culture, Urine     Status: Abnormal   Collection Time: 10/06/19 12:04 PM   Specimen: Urine, Clean Catch  Result Value Ref Range Status   Specimen Description   Final    URINE, CLEAN CATCH Performed at [facility] at [facility], [address], [place], [place] [address]    Special Requests   Final    NONE Performed at [facility] at [facility], [address] [place], [place] [address]    Culture (A)  Final    <10,000 COLONIES/mL INSIGNIFICANT GROWTH Performed at [facility], [place] [address] [place], [place] [address]    Report Status 10/07/2019 FINAL   Final  SARS CORONAVIRUS 2 (TAT 6-24 HRS) Nasopharyngeal Nasopharyngeal Swab     Status: None   Collection Time: 10/09/19 12:23 AM   Specimen: Nasopharyngeal Swab  Result Value Ref Range Status   SARS Coronavirus 2 NEGATIVE NEGATIVE Final    Comment: (  NOTE) SARS-CoV-2 target nucleic acids are NOT DETECTED. The SARS-CoV-2 RNA is generally detectable in upper and lower respiratory specimens during the acute phase of infection. Negative results do not preclude SARS-CoV-2 infection, do not rule out co-infections with other pathogens, and should not be used as the sole basis for treatment or other patient management decisions. Negative results must be combined with clinical observations, patient history, and epidemiological information. The expected result is Negative. Fact Sheet for Patients: SugarRoll.be Fact Sheet for Healthcare Providers: https://www.woods-mathews.com/ This test is not yet approved or cleared by the [place] FDA and  has been authorized for detection and/or diagnosis of SARS-CoV-2 by FDA under an Emergency Use Authorization (EUA). This EUA will remain  in effect (meaning this test can be used) for the duration of the COVID-19 declaration under Section 56 4(b)(1) of the Act, 21 U.S.C. section 360bbb-3(b)(1), unless the authorization is terminated or revoked sooner. Performed at [facility], [place] [address] [place], [place] [address]   Urine culture     Status: Abnormal   Collection Time: 10/09/19  4:07 AM   Specimen: Urine, Random  Result Value Ref Range Status   Specimen Description   Final    URINE, RANDOM Performed at [facility], [place] [address], [place], [place] [address]    Special Requests   Final    NONE Performed at [facility], [place] [address] [place], [place] [address]    Culture (A)  Final    <10,000 COLONIES/mL INSIGNIFICANT GROWTH Performed at [place] [address] [place], [place] [address]    Report Status 10/10/2019 FINAL  Final  C difficile quick scan w PCR reflex     Status: None   Collection Time: 10/09/19  8:29 AM   Specimen: STOOL  Result Value Ref Range Status   C Diff antigen NEGATIVE NEGATIVE Final   C Diff toxin NEGATIVE NEGATIVE Final   C Diff interpretation No C. difficile detected.  Final    Comment: Performed at [facility], [place] [address] [place], [place] [address]    Procedures and diagnostic studies:  No results found.  Medications:   . aspirin EC  81 mg Oral Daily  . atorvastatin  40 mg Oral q1800  . carvedilol  12.5 mg Oral BID WC  . feeding supplement (ENSURE ENLIVE)  237 mL Oral BID BM  . heparin injection (subcutaneous)  5,000 Units Subcutaneous Q8H  . insulin aspart  0-9 Units Subcutaneous TID WC  . [START ON 10/15/2019] losartan  50 mg Oral Daily  . multivitamin with minerals  1 tablet Oral Daily  . potassium chloride  20 mEq Oral Daily   Continuous Infusions:   LOS: 5 days   Clarivel Callaway  [facility]   *Please refer to [email or web address], password [email or web address] to get updated schedule on who will round on this patient, as hospitalists switch teams weekly. If 7PM-7AM, please contact night-coverage at [email or web address], password [email or web address] for any overnight needs.  10/14/2019, 1:07 PM

## 2019-10-15 ENCOUNTER — Inpatient Hospital Stay (HOSPITAL_COMMUNITY): Payer: HMO

## 2019-10-15 DIAGNOSIS — T451X5A Adverse effect of antineoplastic and immunosuppressive drugs, initial encounter: Secondary | ICD-10-CM

## 2019-10-15 DIAGNOSIS — D61810 Antineoplastic chemotherapy induced pancytopenia: Secondary | ICD-10-CM

## 2019-10-15 LAB — COMPREHENSIVE METABOLIC PANEL
ALT: 15 U/L (ref 0–44)
AST: 16 U/L (ref 15–41)
Albumin: 2.9 g/dL — ABNORMAL LOW (ref 3.5–5.0)
Alkaline Phosphatase: 66 U/L (ref 38–126)
Anion gap: 7 (ref 5–15)
BUN: 10 mg/dL (ref 8–23)
CO2: 24 mmol/L (ref 22–32)
Calcium: 7.5 mg/dL — ABNORMAL LOW (ref 8.9–10.3)
Chloride: 102 mmol/L (ref 98–111)
Creatinine, Ser: 1 mg/dL (ref 0.61–1.24)
GFR calc Af Amer: >60 mL/min (ref >60)
GFR calc non Af Amer: >60 mL/min (ref >60)
Glucose, Bld: 119 mg/dL — ABNORMAL HIGH (ref 70–99)
Potassium: 3.7 mmol/L (ref 3.5–5.1)
Sodium: 133 mmol/L — ABNORMAL LOW (ref 135–145)
Total Bilirubin: 0.7 mg/dL (ref 0.3–1.2)
Total Protein: 6.5 g/dL (ref 6.5–8.1)

## 2019-10-15 LAB — BASIC METABOLIC PANEL
Anion gap: 10 (ref 5–15)
BUN: 12 mg/dL (ref 8–23)
CO2: 21 mmol/L — ABNORMAL LOW (ref 22–32)
Calcium: 7.6 mg/dL — ABNORMAL LOW (ref 8.9–10.3)
Chloride: 105 mmol/L (ref 98–111)
Creatinine, Ser: 0.96 mg/dL (ref 0.61–1.24)
GFR calc Af Amer: >60 mL/min (ref >60)
GFR calc non Af Amer: >60 mL/min (ref >60)
Glucose, Bld: 105 mg/dL — ABNORMAL HIGH (ref 70–99)
Potassium: 4.1 mmol/L (ref 3.5–5.1)
Sodium: 136 mmol/L (ref 135–145)

## 2019-10-15 LAB — GLUCOSE, CAPILLARY
Glucose-Capillary: 109 mg/dL — ABNORMAL HIGH (ref 70–99)
Glucose-Capillary: 106 mg/dL — ABNORMAL HIGH (ref 70–99)
Glucose-Capillary: 112 mg/dL — ABNORMAL HIGH (ref 70–99)
Glucose-Capillary: 111 mg/dL — ABNORMAL HIGH (ref 70–99)
Glucose-Capillary: 115 mg/dL — ABNORMAL HIGH (ref 70–99)

## 2019-10-15 LAB — C-REACTIVE PROTEIN: CRP: 6.8 mg/dL — ABNORMAL HIGH (ref <1.0)

## 2019-10-15 LAB — RESPIRATORY PANEL BY RT PCR (FLU A&B, COVID)
Influenza A by PCR: NEGATIVE
Influenza B by PCR: NEGATIVE
SARS Coronavirus 2 by RT PCR: NEGATIVE

## 2019-10-15 LAB — D-DIMER, QUANTITATIVE: D-Dimer, Quant: 3.64 ug/mL-FEU — ABNORMAL HIGH (ref 0.00–0.50)

## 2019-10-15 LAB — LACTIC ACID, PLASMA
Lactic Acid, Venous: 1.5 mmol/L (ref 0.5–1.9)
Lactic Acid, Venous: 1 mmol/L (ref 0.5–1.9)

## 2019-10-15 LAB — PROCALCITONIN: Procalcitonin: 0.21 ng/mL

## 2019-10-15 LAB — FERRITIN: Ferritin: 428 ng/mL — ABNORMAL HIGH (ref 24–336)

## 2019-10-15 MED ORDER — VANCOMYCIN HCL IN DEXTROSE 1-5 GM/200ML-% IV SOLN
1000.0000 mg | Freq: Two times a day (BID) | INTRAVENOUS | Status: DC
  Administered 2019-10-16 – 2019-10-17 (×3): 1000 mg via INTRAVENOUS
  Filled 2019-10-15 (×3): qty 200

## 2019-10-15 MED ORDER — SODIUM CHLORIDE (PF) 0.9 % IJ SOLN
INTRAMUSCULAR | Status: AC
  Filled 2019-10-15: qty 50

## 2019-10-15 MED ORDER — SODIUM CHLORIDE 0.9 % IV SOLN
2.0000 g | Freq: Three times a day (TID) | INTRAVENOUS | Status: DC
  Administered 2019-10-15 – 2019-10-20 (×15): 2 g via INTRAVENOUS
  Filled 2019-10-15 (×16): qty 2

## 2019-10-15 MED ORDER — ACETAMINOPHEN 325 MG PO TABS
650.0000 mg | ORAL_TABLET | ORAL | Status: DC | PRN
  Administered 2019-10-15 – 2019-10-19 (×4): 650 mg via ORAL
  Filled 2019-10-15 (×4): qty 2

## 2019-10-15 MED ORDER — LOPERAMIDE HCL 2 MG PO CAPS
2.0000 mg | ORAL_CAPSULE | ORAL | Status: DC | PRN
  Administered 2019-10-15 – 2019-10-18 (×7): 2 mg via ORAL
  Filled 2019-10-15 (×7): qty 1

## 2019-10-15 MED ORDER — FUROSEMIDE 10 MG/ML IJ SOLN
40.0000 mg | INTRAMUSCULAR | Status: AC
  Administered 2019-10-15: 40 mg via INTRAVENOUS
  Filled 2019-10-15: qty 4

## 2019-10-15 MED ORDER — VANCOMYCIN HCL 10 G IV SOLR
2500.0000 mg | Freq: Once | INTRAVENOUS | Status: AC
  Administered 2019-10-15: 2500 mg via INTRAVENOUS
  Filled 2019-10-15: qty 2000

## 2019-10-15 MED ORDER — IPRATROPIUM-ALBUTEROL 0.5-2.5 (3) MG/3ML IN SOLN
3.0000 mL | RESPIRATORY_TRACT | Status: DC | PRN
  Administered 2019-10-15 – 2019-10-20 (×2): 3 mL via RESPIRATORY_TRACT
  Filled 2019-10-15 (×2): qty 3

## 2019-10-15 MED ORDER — SODIUM CHLORIDE 0.9 % IV SOLN
2.0000 g | Freq: Once | INTRAVENOUS | Status: AC
  Administered 2019-10-15: 2 g via INTRAVENOUS
  Filled 2019-10-15: qty 2

## 2019-10-15 MED ORDER — IOHEXOL 350 MG/ML SOLN
100.0000 mL | Freq: Once | INTRAVENOUS | Status: AC | PRN
  Administered 2019-10-15: 100 mL via INTRAVENOUS

## 2019-10-15 NOTE — Progress Notes (Signed)
Pharmacy Antibiotic Note  [name] [name] is a 78 y.o. 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 Patient was febrile on 12/23. Pharmacy has been consulted to start vancomycin and cefepime for suspected sepsis.   Plan: - vancomycin 2500 mg IV x1, then 1000 mg IV q12h for est AUC 507 - cefepime 2gm IV q8h - monitor renal function, clinical status  ___________________________________  Height: 6' (182.9 cm) Weight: 276 lb 7.3 oz (125.4 kg) IBW/kg (Calculated) : 77.6  Temp (24hrs), Avg:99.5 F (37.5 C), Min:98.1 F (36.7 C), Max:102.4 F (39.1 C)  Recent Labs  Lab 10/08/19 2211 10/09/19 0456 10/11/19 0527 10/12/19 0514 10/13/19 0436 10/13/19 1016 10/14/19 0526 10/15/19 0435  WBC 0.3* 0.3*  --  3.4*  --  4.8  --   --   CREATININE 1.09  --  1.72* 1.08  1.19 1.10  --  0.97 0.96    Estimated Creatinine Clearance: 88.1 mL/min (by C-G formula based on SCr of 0.96 mg/dL).    Allergies  Allergen Reactions  . Doxycycline Rash    Antimicrobials this admission:  12/23 vanc>> 12/23 cefepime>>  Microbiology results:  12/18 GI pct: neg 12/17 cdiff pcr: neg 12/17 ucx: insignif growth FINAL  Thank you for allowing pharmacy to be a part of this patient's care.  Lynelle Doctor 10/15/2019 12:48 PM

## 2019-10-15 NOTE — Progress Notes (Signed)
Dr. Horris Latino notified via text page of DDimer result.

## 2019-10-15 NOTE — Progress Notes (Addendum)
Called and notified patient's daughter of patient's move to room 1433 and updated.  Verbalized understanding.

## 2019-10-15 NOTE — Progress Notes (Signed)
PT Cancellation Note  Patient Details Name: [name] [name] MRN: [id number] DOB: 10-03-1941   Cancelled Treatment:     Pt c/o increased SOB at rest.  Per chart review pt not medically able to participatee today.  D-dimer abnormal r/o PE.   Rica Koyanagi  PTA Acute  Rehabilitation Services Pager      [phone] Office      [phone]

## 2019-10-15 NOTE — Care Management Important Message (Signed)
Important Message  Patient Details IM Letter given to Gabriel Earing RN Case Manager to present to the Patient Name: [name] [name] MRN: [id number] Date of Birth: 11/22/1940   Medicare Important Message Given:  Yes     Kerin Salen 10/15/2019, 10:35 AM

## 2019-10-15 NOTE — Progress Notes (Signed)
OT Cancellation Note  Patient Details Name: [name] [name] MRN: [id number] DOB: Mar 21, 1941   Cancelled Treatment:    Reason Eval/Treat Not Completed: Other (comment). Pt declined OT at 8 am due to feeling SOB. Will check another time  [facility] 10/15/2019, 9:45 AM  Karsten Ro, OTR/L Acute Rehabilitation Services 10/15/2019

## 2019-10-15 NOTE — Progress Notes (Signed)
Report given to Ranae Pila, RN.  Patient to transfer to room 1433.

## 2019-10-15 NOTE — Progress Notes (Signed)
PROGRESS NOTE  [name] [name]  DOB: 10/27/1940  PCP: Colon Branch, MD [id number]  DOA: 10/08/2019  LOS: 6 days   Chief Complaint  Patient presents with  . 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  Patient was admitted with dyspnea, orthopnea, PND and worsening bilateral pedal edema (acute CHF). As per prior documentation "patient reports about 10 months of progressive shortness of breath.  He was diagnosed with stage IV angioimmunoblastic T-cell lymphoma in October 2020.  He had an echocardiogram on 09/12/2019 which showed an EF of 60-65% with grade 1 diastolic dysfunction.  He completed cycle 1 of chemotherapy with doxorubicin, Cytoxan, Adcetris on 09/29/2019. Since starting chemotherapy he has developed chemotherapy associated pancytopenia. In the ED, patient required 2 to 3 L oxygen via nasal cannula to maintain O2 sat. Labs notable for pancytopenia,  high-sensitivity troponin I 869, BNP 1106. Portable chest x-ray showed right Port-A-Cath in place and patchy interstitial and airspace opacities throughout both lungs with bilateral effusions. EKG showed sinus tachycardia, first-degree AV block, T wave flattening throughout, and low voltage. Hospitalist service was consulted to admit for further evaluation and management".  Subjective: Today, patient reported being more short of breath, denies any chest pain, abdominal pain.  Still reported diarrhea, about 3 episodes per day.  Patient had a temp spike of 102.4.  Assessment/Plan: Acute respiratory failure with hypoxia  Acute systolic HF BNP is [id number].   Chest x-ray showed pulmonary edema.  Repeat echocardiogram on 12/7 showed a drop in EF to 45 to 50% with mild hypokinesis of the left  ventricle, grade 1 diastolic dysfunction. Cardiology on board, was initially on IV Lasix twice daily, due to rise in creatinine was held, 1 dose given today on 10/15/2019 Continue Coreg, losartan Continue to monitor on telemetry. Monitor strict I/O's, daily weights Continue supplemental oxygen as needed  Elevated troponin New drop in ejection fraction High-sensitivity troponin I 869. Suspect demand ischemia from above, continue to trend. Cardiology on board, further work-up on outpatient basis.  Chronic diarrhea Ongoing, but improving C. Difficile/GI panel negative Likely related to chemotherapy  Sepsis Noted to be febrile, temp spike of 102.4 on 12/23, tachypneic Unknown etiology, ??  Bacteremia Vs HCAP Lactic acid WNL Procalcitonin 0.21, will trend CRP 6.8, ferritin 428 BC x2 pending UA/UC pending Repeat Covid PCR/flu negative Chest x-ray showed minimal residual patchy bilateral airspace disease, vascular congestion Start IV vancomycin, cefepime as patient is currently immunosuppressed Monitor closely  Elevated D-dimer Likely 2/2 malignancy, rule out PE CTA chest pending  Stage IV angioimmunoblastic T-cell lymphoma with chemotherapy-induced pancytopenia: Follows with oncology, Dr. Maylon Peppers.  Completed cycle 1 of doxorubicin, Cytoxan, and Adcetris on 09/29/2019  Hypertension Continue Coreg, Losartan  Type 2 diabetes SSI, Accu-Cheks, hypoglycemic protocol On Metformin as an outpatient.  A1c is 6.1  Hyperlipidemia Continue statin  Obesity Lifestyle modification advised        DVT prophylaxis:  Heparin Code Status:  DNR Family Communication:  None at bedside  Consultants:  Cardiology  Procedures:  None  Antimicrobials: Anti-infectives (From admission, onward)   Start     Dose/Rate Route Frequency Ordered Stop   10/16/19 0200  vancomycin (VANCOCIN) IVPB 1000 mg/200 mL premix     1,000 mg 200 mL/hr over 60 Minutes Intravenous Every 12 hours 10/15/19  1240  10/15/19 2100  ceFEPIme (MAXIPIME) 2 g in sodium chloride 0.9 % 100 mL IVPB     2 g 200 mL/hr over 30 Minutes Intravenous Every 8 hours 10/15/19 1240     10/15/19 1245  vancomycin (VANCOCIN) 2,500 mg in sodium chloride 0.9 % 500 mL IVPB     2,500 mg 250 mL/hr over 120 Minutes Intravenous  Once 10/15/19 1238     10/15/19 1245  ceFEPIme (MAXIPIME) 2 g in sodium chloride 0.9 % 100 mL IVPB     2 g 200 mL/hr over 30 Minutes Intravenous  Once 10/15/19 1238 10/15/19 1411        Code Status: DNR   Diet Order            Diet heart healthy/carb modified Room service appropriate? Yes; Fluid consistency: Thin; Fluid restriction: 1500 mL Fluid  Diet effective now              Infusions:  . ceFEPime (MAXIPIME) IV    . vancomycin    . [START ON 10/16/2019] vancomycin      Scheduled Meds: . aspirin EC  81 mg Oral Daily  . atorvastatin  40 mg Oral q1800  . carvedilol  12.5 mg Oral BID WC  . feeding supplement (ENSURE ENLIVE)  237 mL Oral BID BM  . heparin injection (subcutaneous)  5,000 Units Subcutaneous Q8H  . insulin aspart  0-9 Units Subcutaneous TID WC  . losartan  50 mg Oral Daily  . multivitamin with minerals  1 tablet Oral Daily  . potassium chloride  20 mEq Oral Daily  . sodium chloride (PF)        PRN meds: acetaminophen, ipratropium-albuterol, loperamide, ondansetron (ZOFRAN) IV   Objective: Vitals:   10/15/19 1154 10/15/19 1432  BP: (!) 158/69 (!) 152/68  Pulse: 68 70  Resp: (!) 22 (!) 21  Temp: (!) 102.4 F (39.1 C) 100.1 F (37.8 C)  SpO2: 94% 93%    Intake/Output Summary (Last 24 hours) at 10/15/2019 1551 Last data filed at 10/15/2019 1145 Gross per 24 hour  Intake 780 ml  Output 400 ml  Net 380 ml   Filed Weights   10/13/19 1157 10/14/19 0500 10/15/19 1331  Weight: 124.1 kg 125.4 kg 124.6 kg   Weight change:  Body mass index is 37.26 kg/m.   Physical Exam:  General: NAD, chronically ill appearing, obese  Cardiovascular: S1, S2  present  Respiratory:  Bibasilar crackles noted, with decreased air entry bilaterally  Abdomen: Soft, nontender, nondistended, bowel sounds present  Musculoskeletal: Trace bilateral pedal edema noted  Skin: Normal  Psychiatry: Normal mood   Data Review: I have personally reviewed the laboratory data and studies available.  Recent Labs  Lab 10/08/19 2211 10/09/19 0456 10/12/19 0514 10/13/19 1016  WBC 0.3* 0.3* 3.4* 4.8  NEUTROABS 0.1* 0.1* 2.4  --   HGB 8.9* 9.0* 8.6* 9.2*  HCT 27.4* 27.1* 27.2* 29.2*  MCV 85.4 85.2 86.6 87.2  PLT 83* 98* 200 308   Recent Labs  Lab 10/09/19 0456 10/11/19 1520 10/12/19 0514 10/13/19 0436 10/14/19 0526 10/15/19 0435 10/15/19 1248  NA  --   --  136  136 136 135 136 133*  K  --   --  3.5  3.5 4.0 3.8 4.1 3.7  CL  --   --  104  103 103 105 105 102  CO2  --   --  22  23 23  21* 21* 24  GLUCOSE  --   --  112*  111* 110* 113* 105* 119*  BUN  --   --  30*  30* 20 14 12 10   CREATININE  --   --  1.08  1.19 1.10 0.97 0.96 1.00  CALCIUM  --   --  7.6*  7.7* 7.8* 7.6* 7.6* 7.5*  MG 1.6* 2.2 2.2  --   --   --   --   PHOS  --   --  2.8  --   --   --   --     Alma Friendly, MD  [facility] 10/15/2019

## 2019-10-15 NOTE — Progress Notes (Addendum)
Progress Note  Patient Name: [name] [name] Date of Encounter: 10/15/2019  Primary Cardiologist: Mertie Moores, MD  Subjective   Reports increased SOB this AM. Felt cold all night so ambient room temp was increased. His oral temp was 100 degrees F around 8:30am, rechecked at 99.1 just now. Diarrhea remains ongoing issue (this has been present as outpatient as well).  Inpatient Medications    Scheduled Meds: . aspirin EC  81 mg Oral Daily  . atorvastatin  40 mg Oral q1800  . carvedilol  12.5 mg Oral BID WC  . feeding supplement (ENSURE ENLIVE)  237 mL Oral BID BM  . heparin injection (subcutaneous)  5,000 Units Subcutaneous Q8H  . insulin aspart  0-9 Units Subcutaneous TID WC  . losartan  50 mg Oral Daily  . multivitamin with minerals  1 tablet Oral Daily  . potassium chloride  20 mEq Oral Daily   Continuous Infusions:  PRN Meds: acetaminophen, ondansetron (ZOFRAN) IV   Vital Signs    Vitals:   10/14/19 2055 10/14/19 2230 10/15/19 0438 10/15/19 0558  BP: (!) 169/78 (!) 144/72  (!) 154/67  Pulse: 70 61 64 66  Resp: 20   20  Temp: 98.4 F (36.9 C)  98.1 F (36.7 C) 98.9 F (37.2 C)  TempSrc: Oral  Oral   SpO2: 97%  96% 97%  Weight:      Height:        Intake/Output Summary (Last 24 hours) at 10/15/2019 0829 Last data filed at 10/15/2019 0330 Gross per 24 hour  Intake 1140 ml  Output --  Net 1140 ml   Last 3 Weights 10/14/2019 10/13/2019 10/11/2019  Weight (lbs) 276 lb 7.3 oz 273 lb 11.2 oz 278 lb 11.2 oz  Weight (kg) 125.4 kg 124.15 kg 126.417 kg     Telemetry    NSR with one PAC - Personally Reviewed  Physical Exam   GEN: No acute distress but mild increased WOB noted.  HEENT: Normocephalic, atraumatic, sclera non-icteric. Neck: No JVD or bruits. Cardiac: RRR no murmurs, rubs, or gallops.  Radials/DP/PT 1+ and equal bilaterally.  Respiratory: Contact precaution stethoscope makes ausculation slightly more challenging, but decreased BS at bases  compared to prior. No wheezing, rales or rhonchi.  GI: Soft, nontender, non-distended, BS +x 4. MS: no deformity. Extremities: No clubbing or cyanosis. Trace BLE edema persists. Distal pedal pulses are 2+ and equal bilaterally. Neuro:  AAOx3. Follows commands. Psych:  Responds to questions appropriately with a normal affect.  Labs    High Sensitivity Troponin:   Recent Labs  Lab 10/08/19 2211 10/09/19 0011  TROPONINIHS 869* 862*      Cardiac EnzymesNo results for input(s): TROPONINI in the last 168 hours. No results for input(s): TROPIPOC in the last 168 hours.   Chemistry Recent Labs  Lab 10/08/19 2211 10/12/19 0514 10/13/19 0436 10/14/19 0526 10/15/19 0435  NA 136 136  136 136 135 136  K 3.4* 3.5  3.5 4.0 3.8 4.1  CL 104 104  103 103 105 105  CO2 24 22  23 23  21* 21*  GLUCOSE 138* 112*  111* 110* 113* 105*  BUN 14 30*  30* 20 14 12   CREATININE 1.09 1.08  1.19 1.10 0.97 0.96  CALCIUM 7.8* 7.6*  7.7* 7.8* 7.6* 7.6*  PROT 6.2*  --   --   --   --   ALBUMIN 2.8* 2.7*  --   --   --   AST 13*  --   --   --   --  ALT 14  --   --   --   --   ALKPHOS 69  --   --   --   --   BILITOT 1.4*  --   --   --   --   GFRNONAA >60 >60  59* >60 >60 >60  GFRAA >60 >60  >60 >60 >60 >60  ANIONGAP 8 10  10 10 9 10      Hematology Recent Labs  Lab 10/09/19 0456 10/12/19 0514 10/13/19 1016  WBC 0.3* 3.4* 4.8  RBC 3.18* 3.14* 3.35*  HGB 9.0* 8.6* 9.2*  HCT 27.1* 27.2* 29.2*  MCV 85.2 86.6 87.2  MCH 28.3 27.4 27.5  MCHC 33.2 31.6 31.5  RDW 14.8 14.9 14.9  PLT 98* 200 308    BNP Recent Labs  Lab 10/08/19 2258 10/13/19 1016  BNP 1,106.6* 831.8*     DDimer No results for input(s): DDIMER in the last 168 hours.   Radiology    No results found.  Cardiac Studies   TTE 10/09/19: 1. Left ventricular ejection fraction, by visual estimation, is 45 to 50%. The left ventricle has mildly decreased function. There is no left ventricular hypertrophy. 2. Mild  hypokinesis of the left ventricular, mid-apical anterior wall and apical segment. 3. Definity contrast agent was given IV to delineate the left ventricular endocardial borders. 4. Left ventricular diastolic parameters are consistent with Grade I diastolic dysfunction (impaired relaxation). 5. Mild to moderately dilated left ventricular internal cavity size. 6. The left ventricle demonstrates regional wall motion abnormalities. 7. Global right ventricle has low normal systolic function.The right ventricular size is normal. No increase in right ventricular wall thickness. 8. Left atrial size was normal. 9. Right atrial size was normal. 10. The mitral valve is grossly normal. Trivial mitral valve regurgitation. 11. The tricuspid valve is grossly normal. Tricuspid valve regurgitation is mild. 12. The aortic valve is tricuspid. Aortic valve regurgitation is trivial. Mild aortic valve sclerosis without stenosis. 13. The pulmonic valve was grossly normal. Pulmonic valve regurgitation is not visualized. 14. Aortic dilatation noted. 15. 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 16. Mildly elevated pulmonary artery systolic pressure. 17. The inferior vena cava is dilated in size with >50% respiratory variability, suggesting right atrial pressure of 8 mmHg.  TTE 09/12/19: 1. Left ventricular ejection fraction, by visual estimation, is 60 to 65%. The left ventricle has normal function. There is moderately increased left ventricular hypertrophy. 2. Definity contrast agent was given IV to delineate the left ventricular endocardial borders. 3. Left ventricular diastolic parameters are consistent with Grade I diastolic dysfunction (impaired relaxation). 4. The tricuspid valve is normal in structure. Tricuspid valve regurgitation is trivial. 5. The aortic valve is normal in structure. Aortic valve regurgitation is trivial. No evidence of aortic valve sclerosis or stenosis. 6.  There is mild dilatation of the ascending aorta measuring 39 mm.   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  He presentedwith volume overload, BNP 1100.CXRshowedpatchy airspace opacities with layering bilateral effusions. He had baseline normal systolic function [id number], with repeat this admission 10/09/19 showing new mid-apical anterior WMA with EF 45-50%.He had recently started cycle 1 of chemotherapy on 09/29/19 (doxorubicin, Cytoxan, Adcetris). Worsening systolic function was felt concerning for chemotoxicity.Low  albumin (2.8) has been felt to also be contributing to volume overload. He received several doses of IV Lasix early in hospitalization, but subsequent diuresis made difficult by labile renal function and ongoing diarrhea.   Assessment & Plan    1. Acute systolic CHF - higher dose of losartan 50mg  is starting today. Continue carvedilol. Diuretics have been on hold per MD with ongoing diarrhea issues. He is noticing increased SOB this morning. Pulse ox 93% RA and BP is elevated. Per d/w Dr. Gardiner Rhyme will give 1 dose of IV Lasix now. Temp is also mildly elevated, further per internal medicine team. CXR is planned. Weight not yet available today. UOP I/O's have not been recorded in mL given ongoing diarrhea. Ultimately may also need to involve oncologyas to how this relates to plans for future chemotherapy - 6 cycles were originally planned per onc note 10/06/19. I would suspect based on prior notes the plan would be to hold on further chemo but will ask MD to comment about this.   2. Elevated troponin- Troponinpeak869. No chest pain or ST  changes on ECG to suggest ACS.Continue ASA as heme parameters allow. Continue statin, BB. LDL at goal. Prior plan is to proceed with ischemic evaluation as outpatient - however, also see palliative care consultation for goals of care. Home follow-up with outpatient palliative care also recommended. ? May need to rethink towards conservative approach.  3. Acute kidney injury- very labile recently, as high as 3.58 on 12/8 in context of dehydration and hypotension. Peak was 1.82 this admission, has remained normal for last several days. Continue to follow.  4.Stage IV angioimmunoblastic T-cell lymphoma withchemotherapy-induced pancytopenia- per oncology, s/p recent chemo.  5. HLD- continue statin.  6. Mild dilation of ascending aorta- given comorbidities, conservative management likely appropriate.Will likely be followed anyway on future echocardiograms.  7. Diarrhea - would suggest IM consider GI consultation while inpatient given ongoing issue.  8. Hypocalcemia - lyte management per primary team.  For questions or updates, please contact [place] Please consult [email or web address] for contact info under Cardiology/STEMI.  Signed, Charlie Pitter, PA-C 10/15/2019, 8:29 AM    Patient seen and examined.  Agree with above documentation.  On exam he is alert and oriented, has trace lower extremity edema, no JVD, decreased breath sounds, regular rate and rhythm, no murmur.  Appears more short of breath today.  Will treat with IV Lasix 40mg x1, will watch renal function as he continues to have persistent diarrhea.  He had a fever this morning, was started on vancomycin and cefepime for HCAP.  Donato Heinz, MD

## 2019-10-15 NOTE — Progress Notes (Addendum)
Patient states he feels, "short of breath and cannot take a deep breath."  VS in CHL.  Sats 93% on RA - 2 L O2 [place] applied and sats 100%.  Patient resting in bed.  Reports this has been happening "every day".  Dr. Horris Latino notified via text page.  [id number] - Cardiology in room to see patient.

## 2019-10-16 LAB — C-REACTIVE PROTEIN: CRP: 7.2 mg/dL — ABNORMAL HIGH (ref <1.0)

## 2019-10-16 LAB — GLUCOSE, CAPILLARY
Glucose-Capillary: 107 mg/dL — ABNORMAL HIGH (ref 70–99)
Glucose-Capillary: 110 mg/dL — ABNORMAL HIGH (ref 70–99)
Glucose-Capillary: 108 mg/dL — ABNORMAL HIGH (ref 70–99)
Glucose-Capillary: 103 mg/dL — ABNORMAL HIGH (ref 70–99)

## 2019-10-16 LAB — CBC WITH DIFFERENTIAL/PLATELET
Abs Immature Granulocytes: 2.12 10*3/uL — ABNORMAL HIGH (ref 0.00–0.07)
Basophils Absolute: 0.1 10*3/uL (ref 0.0–0.1)
Basophils Relative: 1 %
Eosinophils Absolute: 0 10*3/uL (ref 0.0–0.5)
Eosinophils Relative: 0 %
HCT: 27.2 % — ABNORMAL LOW (ref 39.0–52.0)
Hemoglobin: 8.4 g/dL — ABNORMAL LOW (ref 13.0–17.0)
Immature Granulocytes: 31 %
Lymphocytes Relative: 6 %
Lymphs Abs: 0.4 10*3/uL — ABNORMAL LOW (ref 0.7–4.0)
MCH: 26.8 pg (ref 26.0–34.0)
MCHC: 30.9 g/dL (ref 30.0–36.0)
MCV: 86.9 fL (ref 80.0–100.0)
Monocytes Absolute: 0.5 10*3/uL (ref 0.1–1.0)
Monocytes Relative: 8 %
Neutro Abs: 3.8 10*3/uL (ref 1.7–7.7)
Neutrophils Relative %: 54 %
Platelets: 416 10*3/uL — ABNORMAL HIGH (ref 150–400)
RBC: 3.13 MIL/uL — ABNORMAL LOW (ref 4.22–5.81)
RDW: 15.3 % (ref 11.5–15.5)
WBC: 6.9 10*3/uL (ref 4.0–10.5)
nRBC: 0.9 % — ABNORMAL HIGH (ref 0.0–0.2)

## 2019-10-16 LAB — COMPREHENSIVE METABOLIC PANEL
ALT: 15 U/L (ref 0–44)
AST: 17 U/L (ref 15–41)
Albumin: 2.6 g/dL — ABNORMAL LOW (ref 3.5–5.0)
Alkaline Phosphatase: 58 U/L (ref 38–126)
Anion gap: 11 (ref 5–15)
BUN: 12 mg/dL (ref 8–23)
CO2: 21 mmol/L — ABNORMAL LOW (ref 22–32)
Calcium: 7.1 mg/dL — ABNORMAL LOW (ref 8.9–10.3)
Chloride: 101 mmol/L (ref 98–111)
Creatinine, Ser: 1.11 mg/dL (ref 0.61–1.24)
GFR calc Af Amer: >60 mL/min (ref >60)
GFR calc non Af Amer: >60 mL/min (ref >60)
Glucose, Bld: 107 mg/dL — ABNORMAL HIGH (ref 70–99)
Potassium: 3.1 mmol/L — ABNORMAL LOW (ref 3.5–5.1)
Sodium: 133 mmol/L — ABNORMAL LOW (ref 135–145)
Total Bilirubin: 0.6 mg/dL (ref 0.3–1.2)
Total Protein: 5.8 g/dL — ABNORMAL LOW (ref 6.5–8.1)

## 2019-10-16 LAB — PROCALCITONIN: Procalcitonin: 0.28 ng/mL

## 2019-10-16 LAB — BRAIN NATRIURETIC PEPTIDE: B Natriuretic Peptide: 767.1 pg/mL — ABNORMAL HIGH (ref 0.0–100.0)

## 2019-10-16 LAB — FERRITIN: Ferritin: 463 ng/mL — ABNORMAL HIGH (ref 24–336)

## 2019-10-16 MED ORDER — POTASSIUM CHLORIDE CRYS ER 20 MEQ PO TBCR
40.0000 meq | EXTENDED_RELEASE_TABLET | Freq: Once | ORAL | Status: AC
  Administered 2019-10-16: 40 meq via ORAL
  Filled 2019-10-16: qty 2

## 2019-10-16 NOTE — Progress Notes (Signed)
Occupational Therapy Treatment Patient Details Name: [name] [name] MRN: [id number] DOB: 1941/10/05 Today's Date: 10/16/2019    History of present illness 78 yo male admitted to ED on 12/16 with progress weakness, diarrhea, syncopal episode, and respiratory failure. 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 PMH includes OA with history of bilateral TKR and R shoulder arthroplasty, DM, GERD, HTN, lumbar fusion, myasthenia gravis L eye.   OT comments  Pt needed to get to [facility] as he still having bowel urgency  Follow Up Recommendations  Home health OT;Supervision - Intermittent    Equipment Recommendations  None recommended by OT    Recommendations for Other Services      Precautions / Restrictions Precautions Precautions: Fall Restrictions Weight Bearing Restrictions: No       Mobility Bed Mobility Overal bed mobility: Needs Assistance Bed Mobility: Supine to Sit;Sit to Supine     Supine to sit: Supervision Sit to supine: Supervision      Transfers   Equipment used: Rolling walker (2 wheeled) Transfers: Sit to/from [facility] Sit to Stand: Supervision Stand pivot transfers: Min guard            Balance Overall balance assessment: Mild deficits observed, not formally tested                                         ADL either performed or assessed with clinical judgement   ADL Overall ADL's : Needs assistance/impaired                         Toilet Transfer: Stand-pivot;Min Psychiatric nurse Details (indicate cue type and reason): min guard due IV Toileting- Clothing Manipulation and Hygiene: Supervision/safety;Sitting/lateral lean         General ADL Comments: pt states he still has diarrhea and is fatigued.  Pt also verbalized he was aware of sitting EOB before standing and getting to 3n1     Vision Patient Visual Report: No change from baseline            Cognition  Arousal/Alertness: Awake/alert Behavior During Therapy: WFL for tasks assessed/performed Overall Cognitive Status: Within Functional Limits for tasks assessed                                               Frequency  Min 2X/week        Progress Toward Goals  OT Goals(current goals can now be found in the care plan section)  Progress towards OT goals: Progressing toward goals     Plan Discharge plan remains appropriate       AM-PAC OT "6 Clicks" Daily Activity     Outcome Measure   Help from another person eating meals?: None Help from another person taking care of personal grooming?: A Little Help from another person toileting, which includes using toliet, bedpan, or urinal?: A Little Help from another person bathing (including washing, rinsing, drying)?: A Little Help from another person to put on and taking off regular upper body clothing?: A Little Help from another person to put on and taking off regular lower body clothing?: A Little 6 Click Score: 19    End of Session Equipment Utilized During Treatment: Rolling walker  OT Visit  Diagnosis: Unsteadiness on feet (R26.81);Other abnormalities of gait and mobility (R26.89);Muscle weakness (generalized) (M62.81)   Activity Tolerance Patient tolerated treatment well   Patient Left in bed;with call bell/phone within reach   Nurse Communication          Time: [id number] OT Time Calculation (min): 10 min  Charges: OT General Charges $OT Visit: 1 Visit OT Treatments $Self Care/Home Management : 8-22 mins  Kari Baars, [place] [phone] Office- [phone], Edwena Felty D 10/16/2019, 12:28 PM

## 2019-10-16 NOTE — Progress Notes (Signed)
PROGRESS NOTE  [name] [name]  DOB: [birth year]  PCP: Colon Branch, MD [id number]  DOA: 10/08/2019  LOS: 7 days   Chief Complaint  Patient presents with  . 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  Patient was admitted with dyspnea, orthopnea, PND and worsening bilateral pedal edema (acute CHF). As per prior documentation "patient reports about 10 months of progressive shortness of breath.  He was diagnosed with stage IV angioimmunoblastic T-cell lymphoma in October 2020.  He had an echocardiogram on 09/12/2019 which showed an EF of 60-65% with grade 1 diastolic dysfunction.  He completed cycle 1 of chemotherapy with doxorubicin, Cytoxan, Adcetris on 09/29/2019. Since starting chemotherapy he has developed chemotherapy associated pancytopenia. In the ED, patient required 2 to 3 L oxygen via nasal cannula to maintain O2 sat. Labs notable for pancytopenia,  high-sensitivity troponin I 869, BNP 1106. Portable chest x-ray showed right Port-A-Cath in place and patchy interstitial and airspace opacities throughout both lungs with bilateral effusions. EKG showed sinus tachycardia, first-degree AV block, T wave flattening throughout, and low voltage. Hospitalist service was consulted to admit for further evaluation and management".  Subjective: Today, patient reported feeling slightly better, able to ambulate around his room, denies any worsening shortness of breath, denies any chest pain, cough, further fever/chills.  Still with ongoing diarrhea  Assessment/Plan: Acute respiratory failure with hypoxia  Acute systolic HF BNP is [id number].   Chest x-ray showed pulmonary edema.  Repeat echocardiogram on 12/7 showed a drop in EF to 45 to 50% with mild  hypokinesis of the left ventricle, grade 1 diastolic dysfunction. Cardiology on board, was initially on IV Lasix twice daily, due to rise in creatinine was held, 1 dose given today on 10/15/2019 Continue Coreg, losartan Continue to monitor on telemetry. Monitor strict I/O's, daily weights Continue supplemental oxygen as needed  Elevated troponin New drop in ejection fraction High-sensitivity troponin I 869. Suspect demand ischemia from above, continue to trend. Cardiology on board, further work-up on outpatient basis.  Chronic diarrhea Ongoing, probably now worsened with antibiotics C. Difficile/GI panel negative Likely related to chemotherapy Vs Covid Imodium prn  Sepsis Noted to be febrile, temp spike of 102.4 on 12/23, tachypneic Unknown etiology, ??  Bacteremia Vs HCAP, r/o COVID Lactic acid WNL Procalcitonin 0.21, will trend CRP 6.8, ferritin 428, trend BC x2 pending UA/UC pending Repeat Covid PCR/flu negative, send out COVID pending Chest x-ray showed minimal residual patchy bilateral airspace disease, vascular congestion CTA chest showed no PE, new groundglass opacities within the left upper and lower lobe, lingula, concern for COVID-19 pneumonia, edema related to volume overload, right moderate to large pleural effusion Start IV vancomycin, cefepime as patient is currently immunosuppressed Monitor closely  Elevated D-dimer Likely 2/2 malignancy, rule out PE, COVID-19 CTA chest negative for PE, report as above  Stage IV angioimmunoblastic T-cell lymphoma with chemotherapy-induced pancytopenia: Follows with oncology, Dr. Maylon Peppers.  Completed cycle 1 of doxorubicin, Cytoxan, and Adcetris on 09/29/2019  Hypertension Continue Coreg, Losartan  Type 2 diabetes SSI, Accu-Cheks, hypoglycemic protocol On Metformin as an outpatient.  A1c is 6.1  Hyperlipidemia Continue statin  Obesity Lifestyle modification advised        DVT prophylaxis:  Heparin Code Status:   DNR Family Communication:  None at bedside  Consultants:  Cardiology  Procedures:  None  Antimicrobials: Anti-infectives (From admission, onward)   Start     Dose/Rate Route Frequency Ordered Stop   10/16/19 0200  vancomycin (VANCOCIN) IVPB 1000 mg/200 mL premix     1,000 mg 200 mL/hr over 60 Minutes Intravenous Every 12 hours 10/15/19 1240     10/15/19 2100  ceFEPIme (MAXIPIME) 2 g in sodium chloride 0.9 % 100 mL IVPB     2 g 200 mL/hr over 30 Minutes Intravenous Every 8 hours 10/15/19 1240     10/15/19 1245  vancomycin (VANCOCIN) 2,500 mg in sodium chloride 0.9 % 500 mL IVPB     2,500 mg 250 mL/hr over 120 Minutes Intravenous  Once 10/15/19 1238 10/15/19 1804   10/15/19 1245  ceFEPIme (MAXIPIME) 2 g in sodium chloride 0.9 % 100 mL IVPB     2 g 200 mL/hr over 30 Minutes Intravenous  Once 10/15/19 1238 10/15/19 1411        Code Status: DNR   Diet Order            Diet heart healthy/carb modified Room service appropriate? Yes; Fluid consistency: Thin; Fluid restriction: 1500 mL Fluid  Diet effective now              Infusions:  . ceFEPime (MAXIPIME) IV 2 g (10/16/19 1215)  . vancomycin 1,000 mg (10/16/19 1637)    Scheduled Meds: . aspirin EC  81 mg Oral Daily  . atorvastatin  40 mg Oral q1800  . carvedilol  12.5 mg Oral BID WC  . feeding supplement (ENSURE ENLIVE)  237 mL Oral BID BM  . heparin injection (subcutaneous)  5,000 Units Subcutaneous Q8H  . insulin aspart  0-9 Units Subcutaneous TID WC  . losartan  50 mg Oral Daily  . multivitamin with minerals  1 tablet Oral Daily    PRN meds: acetaminophen, ipratropium-albuterol, loperamide, ondansetron (ZOFRAN) IV   Objective: Vitals:   10/16/19 0654 10/16/19 1640  BP: (!) 159/75 (!) 155/69  Pulse: 66 62  Resp: 20   Temp: 98.7 F (37.1 C)   SpO2: 97% 98%    Intake/Output Summary (Last 24 hours) at 10/16/2019 1720 Last data filed at 10/16/2019 1407 Gross per 24 hour  Intake 1560 ml  Output --  Net  1560 ml   Filed Weights   10/14/19 0500 10/15/19 1331 10/16/19 0500  Weight: 125.4 kg 124.6 kg 121.4 kg   Weight change:  Body mass index is 36.3 kg/m.   Physical Exam:  General: NAD, chronically ill-appearing  Cardiovascular: S1, S2 present  Respiratory:  Bibasilar crackles noted, with decreased air entry bilaterally  Abdomen: Soft, nontender, nondistended, bowel sounds present  Musculoskeletal: Trace bilateral pedal edema noted  Skin: Normal  Psychiatry: Normal mood   Data Review: I have personally reviewed the laboratory data and studies available.  Recent Labs  Lab 10/12/19 0514 10/13/19 1016 10/16/19 0322  WBC 3.4* 4.8 6.9  NEUTROABS 2.4  --  3.8  HGB 8.6* 9.2* 8.4*  HCT 27.2* 29.2* 27.2*  MCV 86.6 87.2 86.9  PLT 200 308 416*   Recent Labs  Lab 10/11/19 1520 10/12/19 0514 10/13/19 0436 10/14/19 0526 10/15/19 0435 10/15/19 1248 10/16/19 0322  NA  --  136  136 136 135 136 133* 133*  K  --  3.5  3.5 4.0 3.8 4.1 3.7 3.1*  CL  --  104  103 103 105 105 102 101  CO2  --  22  23 23  21* 21* 24 21*  GLUCOSE  --  112*  111* 110* 113* 105* 119* 107*  BUN  --  30*  30* 20 14 12 10 12   CREATININE  --  1.08  1.19 1.10 0.97 0.96 1.00 1.11  CALCIUM  --  7.6*  7.7* 7.8* 7.6* 7.6* 7.5* 7.1*  MG 2.2 2.2  --   --   --   --   --   PHOS  --  2.8  --   --   --   --   --     Alma Friendly, MD  [facility] 10/16/2019

## 2019-10-16 NOTE — Progress Notes (Addendum)
Progress Note  Patient Name: [name] [name] Date of Encounter: 10/16/2019  Primary Cardiologist: Mertie Moores, MD  Subjective   Tmax 102.4 overnight, requiring supplemental O2. Now on airborne/contact precautions.   I spoke with charge nurse since his PCR covid swab still says it needs to be collected (floor nurse was unavailable at time of call). Yesterday they did a rapid Covid & flu A/B which were negative. She spoke with ID who strongly recommended repeating the PCR test, awaiting collection today.  I spoke with patient via telephone who indicates he actually feels a little better today. Less SOB. Has been up a little in his room and doing OK. No increase in swelling. No CP.  Inpatient Medications    Scheduled Meds: . aspirin EC  81 mg Oral Daily  . atorvastatin  40 mg Oral q1800  . carvedilol  12.5 mg Oral BID WC  . feeding supplement (ENSURE ENLIVE)  237 mL Oral BID BM  . heparin injection (subcutaneous)  5,000 Units Subcutaneous Q8H  . insulin aspart  0-9 Units Subcutaneous TID WC  . losartan  50 mg Oral Daily  . multivitamin with minerals  1 tablet Oral Daily  . potassium chloride  20 mEq Oral Daily   Continuous Infusions: . ceFEPime (MAXIPIME) IV 2 g (10/16/19 0458)  . vancomycin 1,000 mg (10/16/19 0112)   PRN Meds: acetaminophen, ipratropium-albuterol, loperamide, ondansetron (ZOFRAN) IV   Vital Signs    Vitals:   10/15/19 2146 10/16/19 0257 10/16/19 0500 10/16/19 0654  BP: (!) 156/60 (!) 157/82  (!) 159/75  Pulse: 68 64  66  Resp: (!) 22 20  20   Temp: 99.5 F (37.5 C) 98.7 F (37.1 C)  98.7 F (37.1 C)  TempSrc: Oral Oral  Oral  SpO2: 94% 92%  97%  Weight:   121.4 kg   Height:        Intake/Output Summary (Last 24 hours) at 10/16/2019 0802 Last data filed at 10/16/2019 0600 Gross per 24 hour  Intake 1120 ml  Output 500 ml  Net 620 ml   Last 3 Weights 10/16/2019 10/15/2019 10/14/2019  Weight (lbs) 267 lb 10.2 oz 274 lb 11.1 oz 276 lb 7.3 oz    Weight (kg) 121.4 kg 124.6 kg 125.4 kg     Telemetry    NSR with rare PACs - Personally Reviewed  Physical Exam   Examination pending MD review.  Labs    High Sensitivity Troponin:   Recent Labs  Lab 10/08/19 2211 10/09/19 0011  TROPONINIHS 869* 862*      Chemistry Recent Labs  Lab 10/12/19 0514 10/15/19 0435 10/15/19 1248 10/16/19 0322  NA 136  136 136 133* 133*  K 3.5  3.5 4.1 3.7 3.1*  CL 104  103 105 102 101  CO2 22  23 21* 24 21*  GLUCOSE 112*  111* 105* 119* 107*  BUN 30*  30* 12 10 12   CREATININE 1.08  1.19 0.96 1.00 1.11  CALCIUM 7.6*  7.7* 7.6* 7.5* 7.1*  PROT  --   --  6.5 5.8*  ALBUMIN 2.7*  --  2.9* 2.6*  AST  --   --  16 17  ALT  --   --  15 15  ALKPHOS  --   --  66 58  BILITOT  --   --  0.7 0.6  GFRNONAA >60  59* >60 >60 >60  GFRAA >60  >60 >60 >60 >60  ANIONGAP 10  10 10 7  11  Hematology Recent Labs  Lab 10/12/19 0514 10/13/19 1016 10/16/19 0322  WBC 3.4* 4.8 6.9  RBC 3.14* 3.35* 3.13*  HGB 8.6* 9.2* 8.4*  HCT 27.2* 29.2* 27.2*  MCV 86.6 87.2 86.9  MCH 27.4 27.5 26.8  MCHC 31.6 31.5 30.9  RDW 14.9 14.9 15.3  PLT 200 308 416*    BNP Recent Labs  Lab 10/13/19 1016 10/16/19 0322  BNP 831.8* 767.1*     DDimer  Recent Labs  Lab 10/15/19 1024  DDIMER 3.64*     Radiology    CT ANGIO CHEST PE W OR WO CONTRAST  Result Date: 10/15/2019 CLINICAL DATA:  PE suspected, high pretest probability. Elevated D-dimer and shortness of breath. History of T-cell lymphoma, per earlier radiology reports. EXAM: CT ANGIOGRAPHY CHEST WITH CONTRAST TECHNIQUE: Multidetector CT imaging of the chest was performed using the standard protocol during bolus administration of intravenous contrast. Multiplanar CT image reconstructions and MIPs were obtained to evaluate the vascular anatomy. CONTRAST:  158mL OMNIPAQUE IOHEXOL 350 MG/ML SOLN COMPARISON:  Chest CT angiogram dated 08/25/2019. FINDINGS: Cardiovascular: Study is limited due to  patient breathing motion artifact and attenuation artifact. Given these limitations, there is no convincing pulmonary embolism identified. No thoracic aortic aneurysm or evidence of aortic dissection. Cardiomegaly. No pericardial effusion. Mediastinum/Nodes: Grossly stable subcarinal lymphadenopathy. Esophagus is unremarkable. Trachea is unremarkable. Lungs/Pleura: Ground-glass consolidations are present within the LEFT upper lobe, lingula and LEFT lower lobe. More confluent consolidation is present within the anterior aspects of the LEFT upper lobe. RIGHT pleural effusion, moderate to large in size, with adjacent atelectasis. Small LEFT pleural effusion. Upper Abdomen: Low-density material surrounding the pancreatic head and proximal pancreatic body, extending into the portacaval space, presumably patient's known abdominal lymphadenopathy, incompletely imaged at the lower portion of the study. Small amount of free fluid in the upper abdomen, as described on the earlier PET-CT report of 09/17/2019. w Musculoskeletal: No acute or suspicious osseous finding. Bilateral axillary lymphadenopathy, LEFT greater than RIGHT, as previously described. Review of the MIP images confirms the above findings. IMPRESSION: 1. No pulmonary embolism seen, but with fairly significant study limitations as detailed above. 2. New ground-glass opacities within the LEFT upper lobe, lingula and LEFT lower lobe. Differential includes atypical pneumonias such as viral (including COVID-19 pneumonia) or fungal, interstitial pneumonias, edema related to volume overload/CHF, chronic interstitial diseases, hypersensitivity pneumonitis, and respiratory bronchiolitis. 3. More confluent consolidation within the anterior aspects of the LEFT upper lobe, also suspicious for pneumonia. 4. RIGHT pleural effusion, moderate to large in size, with adjacent atelectasis. 5. Bilateral axillary lymphadenopathy, LEFT greater than RIGHT, as previously described,  compatible with previous radiology reports describing T-cell lymphoma/ischemia. Also grossly stable subcarinal and upper abdominal lymphadenopathy. 6. Small amount of free fluid in the upper abdomen, as described on the earlier PET-CT report of 09/17/2019. 7. Cardiomegaly. These results will be called to the ordering clinician or representative by the Radiologist Assistant, and communication documented in the PACS or zVision Dashboard. Electronically Signed   By: Franki Cabot M.D.   On: 10/15/2019 16:15   DG Chest Port 1 View  Result Date: 10/15/2019 CLINICAL DATA:  Shortness of breath EXAM: PORTABLE CHEST 1 VIEW COMPARISON:  10/08/2019 FINDINGS: Right Port-A-Cath remains in place, with the tip in the SVC. Cardiomegaly, vascular congestion. Improved aeration of the lungs. Minimal residual patchy opacities in both lungs. No visible significant effusions or acute bony abnormality. IMPRESSION: Improved aeration in the lungs. Minimal residual patchy bilateral airspace disease. No visible effusions. Cardiomegaly, vascular  congestion. Electronically Signed   By: Rolm Baptise M.D.   On: 10/15/2019 09:25    Cardiac Studies   TTE 10/09/19: 1. Left ventricular ejection fraction, by visual estimation, is 45 to 50%. The left ventricle has mildly decreased function. There is no left ventricular hypertrophy. 2. Mild hypokinesis of the left ventricular, mid-apical anterior wall and apical segment. 3. Definity contrast agent was given IV to delineate the left ventricular endocardial borders. 4. Left ventricular diastolic parameters are consistent with Grade I diastolic dysfunction (impaired relaxation). 5. Mild to moderately dilated left ventricular internal cavity size. 6. The left ventricle demonstrates regional wall motion abnormalities. 7. Global right ventricle has low normal systolic function.The right ventricular size is normal. No increase in right ventricular wall thickness. 8. Left atrial size  was normal. 9. Right atrial size was normal. 10. The mitral valve is grossly normal. Trivial mitral valve regurgitation. 11. The tricuspid valve is grossly normal. Tricuspid valve regurgitation is mild. 12. The aortic valve is tricuspid. Aortic valve regurgitation is trivial. Mild aortic valve sclerosis without stenosis. 13. The pulmonic valve was grossly normal. Pulmonic valve regurgitation is not visualized. 14. Aortic dilatation noted. 15. 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 16. Mildly elevated pulmonary artery systolic pressure. 17. The inferior vena cava is dilated in size with >50% respiratory variability, suggesting right atrial pressure of 8 mmHg.  TTE 09/12/19: 1. Left ventricular ejection fraction, by visual estimation, is 60 to 65%. The left ventricle has normal function. There is moderately increased left ventricular hypertrophy. 2. Definity contrast agent was given IV to delineate the left ventricular endocardial borders. 3. Left ventricular diastolic parameters are consistent with Grade I diastolic dysfunction (impaired relaxation). 4. The tricuspid valve is normal in structure. Tricuspid valve regurgitation is trivial. 5. The aortic valve is normal in structure. Aortic valve regurgitation is trivial. No evidence of aortic valve sclerosis or stenosis. 6. There is mild dilatation of the ascending aorta measuring 39 mm.   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  He presentedwith volume overload and BNP 1100. Rapid covid negative 12/16, PCR Covid negative 12/17.CXRshowedpatchy airspace opacities with  layering bilateral effusions. He had baseline normal systolic function [id number], with repeat this admission 10/09/19 showing new mid-apical anterior WMA with EF 45-50%.He had recently started cycle 1 of chemotherapy on 09/29/19 (doxorubicin, Cytoxan, Adcetris). Worsening systolic function was felt concerning for chemotoxicity.Low albumin  has been felt to also be contributing to volume overload. He received several doses of IV Lasix early in hospitalization, but subsequent diuresis was paused labile renal function and ongoing diarrhea.   On 10/15/19, developed worsening SOB, hypoxia requiring nasal cannula supplementation. Received 40mg  IV Lasix x1. Developed low grade fever (Tmax 102.4) and CTA findings as outlined below.  Assessment & Plan    1. Acute systolic CHF - events of yesterday noted. He developed fever 10/15/19 with GGO + PNA and right pleural effusion on CTA. Rapid Covid negative but PCR testing pending. Continue losartan and carvedilol. BNP slightly decreased from prior. Will discuss medication plan with MD given interim events. Received 1 dose IV Lasix. Weight down approximately 10lb from admission. Fortunately he is feeling better this morning. I/O's inaccurate as urinary occurrences counted individually in context of  diarrhea. Question whether he would benefit from thoracentesis for moderate-large right pleural effusion.  2. Fever, hypoxia and dyspnea - labs notable for mild hyponatremia, elevated CRP/ferritin, and low procalcitonin. CTA negative for PE but showed New ground-glass opacities within the LEFT upper lobe, lingula and LEFT lower lobe, more confluent consolidation within the anterior aspects of the LEFT upper lobe, also suspicious for pneumonia, moderate to large right pleural effusion, and adenopathy c/w lymphoma history. Further per internal medicine.  3. Elevated troponin- Troponinpeak869 (equivalent to roughly 0.8 by old parameters). No chest pain or ST changes on ECG  to suggest ACS.Continue ASA asheme parametersallow. Continue statin, BB.LDL at goal. Prior plan was to proceed with ischemic evaluationas outpatient - however, also see palliative care consultation for goals of care. Home follow-up with outpatient palliative care also recommended. Difficult to get a sense for prognosis from notes. May be appropriate to see in follow-up and decide from there.  4. Acute kidney injury-very labilerecently, as high as 3.58 on 12/8 in context of dehydration and hypotension. Peak was 1.82 this admission, has remained normal for last several days. Received contrast 12/23 with CTA. Continue to follow.  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  6 cycles were originally planned per onc note 10/06/19. However, with question of chemo causing cardiotoxicity, oncology will need to be notified closer to discharge for involvement in planning purposes.  6. HLD- continue statin.  7. Mild dilation of ascending aorta- given comorbidities, conservative management likely appropriate.Will likely be followed anyway on future echocardiograms.  8. Diarrhea - per primary team.  9. Hypocalcemia, hypokalemia - lyte management per primary team.  For questions or updates, please contact [place] Please consult [email or web address] for contact info under Cardiology/STEMI.  Today's rounding note was started virtually in attempt to reduce patient/provider exposure and conservation of PPE given concern for Covid-19 infection.  Signed, Charlie Pitter, PA-C 10/16/2019, 8:02 AM    Today's rounding note was started virtually in attempt to reduce patient/provider exposure and conservation of PPE given concern for Covid-19 infection.  Spoke with [name] [name].  He reports dyspnea has improved.  Continue to have frequent diarrhea.  Febrile overnight, now on airborne precautions.  He is alert and oriented, responds  appropriately to questions, able to speak in complete sentences, no respiratory distress.   Received IV Lasix 40 mg x 1 yesterday.  Stable renal function today. Weight is down. CTA was negative for PE but concerning for possible pneumonia, moderate to large right pleural effusion.  He is on vancomycin and cefepime.  Suspect his worsening respiratory status due to infection and volume, will continue to monitor and diurese as needed.  Donato Heinz, MD

## 2019-10-17 DIAGNOSIS — A419 Sepsis, unspecified organism: Secondary | ICD-10-CM

## 2019-10-17 DIAGNOSIS — R778 Other specified abnormalities of plasma proteins: Secondary | ICD-10-CM

## 2019-10-17 DIAGNOSIS — R6520 Severe sepsis without septic shock: Secondary | ICD-10-CM

## 2019-10-17 DIAGNOSIS — J189 Pneumonia, unspecified organism: Secondary | ICD-10-CM

## 2019-10-17 LAB — COMPREHENSIVE METABOLIC PANEL
ALT: 13 U/L (ref 0–44)
AST: 20 U/L (ref 15–41)
Albumin: 2.6 g/dL — ABNORMAL LOW (ref 3.5–5.0)
Alkaline Phosphatase: 62 U/L (ref 38–126)
Anion gap: 15 (ref 5–15)
BUN: 10 mg/dL (ref 8–23)
CO2: 16 mmol/L — ABNORMAL LOW (ref 22–32)
Calcium: 7.2 mg/dL — ABNORMAL LOW (ref 8.9–10.3)
Chloride: 101 mmol/L (ref 98–111)
Creatinine, Ser: 0.84 mg/dL (ref 0.61–1.24)
GFR calc Af Amer: >60 mL/min (ref >60)
GFR calc non Af Amer: >60 mL/min (ref >60)
Glucose, Bld: 92 mg/dL (ref 70–99)
Potassium: 3.7 mmol/L (ref 3.5–5.1)
Sodium: 132 mmol/L — ABNORMAL LOW (ref 135–145)
Total Bilirubin: 0.6 mg/dL (ref 0.3–1.2)
Total Protein: 6.2 g/dL — ABNORMAL LOW (ref 6.5–8.1)

## 2019-10-17 LAB — CBC WITH DIFFERENTIAL/PLATELET
Abs Immature Granulocytes: 1.51 10*3/uL — ABNORMAL HIGH (ref 0.00–0.07)
Basophils Absolute: 0.2 10*3/uL — ABNORMAL HIGH (ref 0.0–0.1)
Basophils Relative: 2 %
Eosinophils Absolute: 0 10*3/uL (ref 0.0–0.5)
Eosinophils Relative: 0 %
HCT: 27.8 % — ABNORMAL LOW (ref 39.0–52.0)
Hemoglobin: 8.6 g/dL — ABNORMAL LOW (ref 13.0–17.0)
Immature Granulocytes: 17 %
Lymphocytes Relative: 7 %
Lymphs Abs: 0.6 10*3/uL — ABNORMAL LOW (ref 0.7–4.0)
MCH: 27 pg (ref 26.0–34.0)
MCHC: 30.9 g/dL (ref 30.0–36.0)
MCV: 87.1 fL (ref 80.0–100.0)
Monocytes Absolute: 0.7 10*3/uL (ref 0.1–1.0)
Monocytes Relative: 8 %
Neutro Abs: 6.1 10*3/uL (ref 1.7–7.7)
Neutrophils Relative %: 66 %
Platelets: 435 10*3/uL — ABNORMAL HIGH (ref 150–400)
RBC: 3.19 MIL/uL — ABNORMAL LOW (ref 4.22–5.81)
RDW: 15.4 % (ref 11.5–15.5)
WBC: 9.2 10*3/uL (ref 4.0–10.5)
nRBC: 0 % (ref 0.0–0.2)

## 2019-10-17 LAB — GLUCOSE, CAPILLARY
Glucose-Capillary: 119 mg/dL — ABNORMAL HIGH (ref 70–99)
Glucose-Capillary: 126 mg/dL — ABNORMAL HIGH (ref 70–99)
Glucose-Capillary: 122 mg/dL — ABNORMAL HIGH (ref 70–99)
Glucose-Capillary: 118 mg/dL — ABNORMAL HIGH (ref 70–99)

## 2019-10-17 LAB — C-REACTIVE PROTEIN: CRP: 5.3 mg/dL — ABNORMAL HIGH (ref <1.0)

## 2019-10-17 LAB — NOVEL CORONAVIRUS, NAA (HOSP ORDER, SEND-OUT TO REF LAB; TAT 18-24 HRS): SARS-CoV-2, NAA: NOT DETECTED

## 2019-10-17 LAB — FERRITIN: Ferritin: 586 ng/mL — ABNORMAL HIGH (ref 24–336)

## 2019-10-17 LAB — PROCALCITONIN: Procalcitonin: 0.17 ng/mL

## 2019-10-17 MED ORDER — SACCHAROMYCES BOULARDII 250 MG PO CAPS
250.0000 mg | ORAL_CAPSULE | Freq: Two times a day (BID) | ORAL | Status: DC
  Administered 2019-10-17 – 2019-10-20 (×7): 250 mg via ORAL
  Filled 2019-10-17 (×7): qty 1

## 2019-10-17 MED ORDER — SODIUM CHLORIDE 0.9 % IV SOLN
INTRAVENOUS | Status: DC | PRN
  Administered 2019-10-17: 250 mL via INTRAVENOUS

## 2019-10-17 MED ORDER — VANCOMYCIN HCL 1500 MG/300ML IV SOLN
1500.0000 mg | INTRAVENOUS | Status: DC
  Administered 2019-10-17: 1500 mg via INTRAVENOUS
  Filled 2019-10-17 (×2): qty 300

## 2019-10-17 NOTE — Progress Notes (Signed)
PROGRESS NOTE  [name] [name]  DOB: 08-05-1941  PCP: Colon Branch, MD [id number]  DOA: 10/08/2019  LOS: 8 days   Chief Complaint  Patient presents with  . 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  Patient was admitted with dyspnea, orthopnea, PND and worsening bilateral pedal edema (acute CHF). As per prior documentation "patient reports about 10 months of progressive shortness of breath.  He was diagnosed with stage IV angioimmunoblastic T-cell lymphoma in October 2020.  He had an echocardiogram on 09/12/2019 which showed an EF of 60-65% with grade 1 diastolic dysfunction.  He completed cycle 1 of chemotherapy with doxorubicin, Cytoxan, Adcetris on 09/29/2019. Since starting chemotherapy he has developed chemotherapy associated pancytopenia. In the ED, patient required 2 to 3 L oxygen via nasal cannula to maintain O2 sat. Labs notable for pancytopenia,  high-sensitivity troponin I 869, BNP 1106. Portable chest x-ray showed right Port-A-Cath in place and patchy interstitial and airspace opacities throughout both lungs with bilateral effusions. EKG showed sinus tachycardia, first-degree AV block, T wave flattening throughout, and low voltage. Hospitalist service was consulted to admit for further evaluation and management".  Subjective: Patient seen and examined at bedside.  Continues to improve, denies any worsening shortness of breath, chest pain, abdominal pain, nausea/vomiting, fever/chills.  Still with ongoing diarrhea  Assessment/Plan: Acute respiratory failure with hypoxia  Acute systolic HF BNP is [id number].   Chest x-ray showed pulmonary edema.  Repeat echocardiogram on 12/7 showed a drop in EF to 45 to 50% with mild hypokinesis of  the left ventricle, grade 1 diastolic dysfunction. Cardiology on board, was initially on IV Lasix twice daily, due to rise in creatinine was held, 1 dose given today on 10/15/2019 Continue Coreg, losartan Continue to monitor on telemetry. Monitor strict I/O's, daily weights Continue supplemental oxygen as needed  Elevated troponin New drop in ejection fraction High-sensitivity troponin I 869. Suspect demand ischemia from above, continue to trend. Cardiology on board, further work-up on outpatient basis.  Chronic diarrhea Ongoing, probably now worsened with antibiotics C. Difficile/GI panel negative Likely related to chemotherapy Vs Covid Imodium prn, probiotics  Sepsis likely 2/2 HCAP VS ??bacteremia Noted to be febrile, temp spike of 102.4 on 12/23, tachypneic No further fever episodes Lactic acid WNL Procalcitonin 0.21--> 0.28--> 0.17 CRP 6.8, ferritin 428, trend BC x2 grew 1/4 GPC UA/UC pending Repeat Covid PCR/flu negative, send out COVID negative Chest x-ray showed minimal residual patchy bilateral airspace disease, vascular congestion CTA chest showed no PE, new groundglass opacities within the left upper and lower lobe, lingula, concern for COVID-19 pneumonia, edema related to volume overload, right moderate to large pleural effusion Continue IV vancomycin, cefepime as patient is currently immunosuppressed Monitor closely  Elevated D-dimer Likely 2/2 malignancy CTA chest negative for PE, report as above  Stage IV angioimmunoblastic T-cell lymphoma with chemotherapy-induced pancytopenia: Follows with oncology, Dr. Maylon Peppers.  Completed cycle 1 of doxorubicin, Cytoxan, and Adcetris on 09/29/2019  Hypertension Continue Coreg, Losartan  Type 2 diabetes SSI, Accu-Cheks, hypoglycemic protocol On Metformin as an outpatient.  A1c is 6.1  Hyperlipidemia Continue statin  Obesity Lifestyle modification advised        DVT prophylaxis:  Heparin Code Status:   DNR Family Communication:  None at bedside  Consultants:  Cardiology  Procedures:  None  Antimicrobials: Anti-infectives (From  admission, onward)   Start     Dose/Rate Route Frequency Ordered Stop   10/17/19 1400  vancomycin (VANCOREADY) IVPB 1500 mg/300 mL     1,500 mg 150 mL/hr over 120 Minutes Intravenous Every 24 hours 10/17/19 1110     10/16/19 0200  vancomycin (VANCOCIN) IVPB 1000 mg/200 mL premix  Status:  Discontinued     1,000 mg 200 mL/hr over 60 Minutes Intravenous Every 12 hours 10/15/19 1240 10/17/19 1110   10/15/19 2100  ceFEPIme (MAXIPIME) 2 g in sodium chloride 0.9 % 100 mL IVPB     2 g 200 mL/hr over 30 Minutes Intravenous Every 8 hours 10/15/19 1240     10/15/19 1245  vancomycin (VANCOCIN) 2,500 mg in sodium chloride 0.9 % 500 mL IVPB     2,500 mg 250 mL/hr over 120 Minutes Intravenous  Once 10/15/19 1238 10/15/19 1804   10/15/19 1245  ceFEPIme (MAXIPIME) 2 g in sodium chloride 0.9 % 100 mL IVPB     2 g 200 mL/hr over 30 Minutes Intravenous  Once 10/15/19 1238 10/15/19 1411        Code Status: DNR   Diet Order            Diet heart healthy/carb modified Room service appropriate? Yes; Fluid consistency: Thin; Fluid restriction: 1500 mL Fluid  Diet effective now              Infusions:  . sodium chloride 250 mL (10/17/19 1155)  . ceFEPime (MAXIPIME) IV 2 g (10/17/19 1156)  . vancomycin 1,500 mg (10/17/19 1311)    Scheduled Meds: . aspirin EC  81 mg Oral Daily  . atorvastatin  40 mg Oral q1800  . carvedilol  12.5 mg Oral BID WC  . feeding supplement (ENSURE ENLIVE)  237 mL Oral BID BM  . heparin injection (subcutaneous)  5,000 Units Subcutaneous Q8H  . insulin aspart  0-9 Units Subcutaneous TID WC  . losartan  50 mg Oral Daily  . multivitamin with minerals  1 tablet Oral Daily  . saccharomyces boulardii  250 mg Oral BID    PRN meds: sodium chloride, acetaminophen, ipratropium-albuterol, loperamide, ondansetron (ZOFRAN) IV    Objective: Vitals:   10/17/19 0732 10/17/19 1143  BP:  (!) 144/78  Pulse: 62 60  Resp:  16  Temp:  98.2 F (36.8 C)  SpO2:  96%    Intake/Output Summary (Last 24 hours) at 10/17/2019 1356 Last data filed at 10/17/2019 1000 Gross per 24 hour  Intake 1520 ml  Output 375 ml  Net 1145 ml   Filed Weights   10/15/19 1331 10/16/19 0500 10/17/19 0441  Weight: 124.6 kg 121.4 kg 121.3 kg   Weight change: -3.3 kg Body mass index is 36.27 kg/m.   Physical Exam:  General: NAD, chronically ill-appearing  Cardiovascular: S1, S2 present  Respiratory:  Bibasilar crackles noted, with decreased air entry bilaterally  Abdomen: Soft, nontender, nondistended, bowel sounds present  Musculoskeletal: Trace bilateral pedal edema noted  Skin: Normal  Psychiatry: Normal mood   Data Review: I have personally reviewed the laboratory data and studies available.  Recent Labs  Lab 10/12/19 0514 10/13/19 1016 10/16/19 0322 10/17/19 0402  WBC 3.4* 4.8 6.9 9.2  NEUTROABS 2.4  --  3.8 PENDING  HGB 8.6* 9.2* 8.4* 8.6*  HCT 27.2* 29.2* 27.2* 27.8*  MCV 86.6 87.2 86.9 87.1  PLT 200 308 416* 435*   Recent Labs  Lab 10/11/19 1520 10/12/19 0514 10/14/19 0526 10/15/19 0435 10/15/19 1248 10/16/19 0322  10/17/19 0402  NA  --  136  136 135 136 133* 133* 132*  K  --  3.5  3.5 3.8 4.1 3.7 3.1* 3.7  CL  --  104  103 105 105 102 101 101  CO2  --  22  23 21* 21* 24 21* 16*  GLUCOSE  --  112*  111* 113* 105* 119* 107* 92  BUN  --  30*  30* 14 12 10 12 10   CREATININE  --  1.08  1.19 0.97 0.96 1.00 1.11 0.84  CALCIUM  --  7.6*  7.7* 7.6* 7.6* 7.5* 7.1* 7.2*  MG 2.2 2.2  --   --   --   --   --   PHOS  --  2.8  --   --   --   --   --     Alma Friendly, MD  [facility] 10/17/2019

## 2019-10-17 NOTE — Plan of Care (Signed)
  Problem: Education: Goal: Knowledge of General Education information will improve Description: Including pain rating scale, medication(s)/side effects and non-pharmacologic comfort measures Outcome: Completed/Met   Problem: Health Behavior/Discharge Planning: Goal: Ability to manage health-related needs will improve Outcome: Progressing   Problem: Clinical Measurements: Goal: Ability to maintain clinical measurements within normal limits will improve Outcome: Progressing Goal: Will remain free from infection Outcome: Progressing Goal: Diagnostic test results will improve Outcome: Progressing   Problem: Activity: Goal: Risk for activity intolerance will decrease Outcome: Progressing   Problem: Nutrition: Goal: Adequate nutrition will be maintained Outcome: Progressing   Problem: Coping: Goal: Level of anxiety will decrease Outcome: Completed/Met   Problem: Elimination: Goal: Will not experience complications related to bowel motility Outcome: Progressing   Problem: Skin Integrity: Goal: Risk for impaired skin integrity will decrease Outcome: Completed/Met   Problem: Education: Goal: Ability to demonstrate management of disease process will improve Outcome: Progressing Goal: Ability to verbalize understanding of medication therapies will improve Outcome: Progressing Goal: Individualized Educational Video(s) Outcome: Not Applicable   Problem: Activity: Goal: Capacity to carry out activities will improve Outcome: Progressing   Problem: Cardiac: Goal: Ability to achieve and maintain adequate cardiopulmonary perfusion will improve Outcome: Progressing

## 2019-10-17 NOTE — Plan of Care (Signed)
  Problem: Health Behavior/Discharge Planning: Goal: Ability to manage health-related needs will improve Outcome: Progressing   Problem: Clinical Measurements: Goal: Ability to maintain clinical measurements within normal limits will improve Outcome: Progressing Goal: Will remain free from infection Outcome: Progressing Goal: Diagnostic test results will improve Outcome: Progressing   Problem: Elimination: Goal: Will not experience complications related to bowel motility Outcome: Progressing

## 2019-10-17 NOTE — Progress Notes (Signed)
Progress Note  Patient Name: [name] [name] Date of Encounter: 10/17/2019  Primary Cardiologist: Mertie Moores, MD  Subjective   No fevers overnight.  Remains on 2L [place].  COVID pending.  Reports dyspnea is improved.  Denies any chest pain.  Continues to have diarrhea.   Inpatient Medications    Scheduled Meds: . aspirin EC  81 mg Oral Daily  . atorvastatin  40 mg Oral q1800  . carvedilol  12.5 mg Oral BID WC  . feeding supplement (ENSURE ENLIVE)  237 mL Oral BID BM  . heparin injection (subcutaneous)  5,000 Units Subcutaneous Q8H  . insulin aspart  0-9 Units Subcutaneous TID WC  . losartan  50 mg Oral Daily  . multivitamin with minerals  1 tablet Oral Daily   Continuous Infusions: . ceFEPime (MAXIPIME) IV 2 g (10/17/19 0436)  . vancomycin 1,000 mg (10/17/19 0118)   PRN Meds: acetaminophen, ipratropium-albuterol, loperamide, ondansetron (ZOFRAN) IV   Vital Signs    Vitals:   10/16/19 1731 10/16/19 2054 10/17/19 0441 10/17/19 0558  BP: (!) 153/78 (!) 145/77  (!) 141/63  Pulse: 60 (!) 59  (!) 55  Resp: 16 (!) 22  20  Temp: 98.9 F (37.2 C) 98.8 F (37.1 C)  98.6 F (37 C)  TempSrc: Oral Oral    SpO2: 98% 94%  96%  Weight:   121.3 kg   Height:        Intake/Output Summary (Last 24 hours) at 10/17/2019 0704 Last data filed at 10/17/2019 0600 Gross per 24 hour  Intake 1280 ml  Output --  Net 1280 ml   Last 3 Weights 10/17/2019 10/16/2019 10/15/2019  Weight (lbs) 267 lb 6.7 oz 267 lb 10.2 oz 274 lb 11.1 oz  Weight (kg) 121.3 kg 121.4 kg 124.6 kg     Telemetry    NSR with rare PACs - Personally Reviewed  Physical Exam   GEN:No acute distress Neck:No JVD appreciated Cardiac: RRR Respiratory:diminished breath sounds [id number], non-distended  [id number] edema Neuro:Nonfocal  Psych: Normal affect   Labs    High Sensitivity Troponin:   Recent Labs  Lab 10/08/19 2211 10/09/19 0011  TROPONINIHS 869* 862*      Chemistry Recent Labs  Lab  10/12/19 0514 10/15/19 0435 10/15/19 1248 10/16/19 0322  NA 136  136 136 133* 133*  K 3.5  3.5 4.1 3.7 3.1*  CL 104  103 105 102 101  CO2 22  23 21* 24 21*  GLUCOSE 112*  111* 105* 119* 107*  BUN 30*  30* 12 10 12   CREATININE 1.08  1.19 0.96 1.00 1.11  CALCIUM 7.6*  7.7* 7.6* 7.5* 7.1*  PROT  --   --  6.5 5.8*  ALBUMIN 2.7*  --  2.9* 2.6*  AST  --   --  16 17  ALT  --   --  15 15  ALKPHOS  --   --  66 58  BILITOT  --   --  0.7 0.6  GFRNONAA >60  59* >60 >60 >60  GFRAA >60  >60 >60 >60 >60  ANIONGAP 10  10 10 7 11      Hematology Recent Labs  Lab 10/13/19 1016 10/16/19 0322 10/17/19 0402  WBC 4.8 6.9 9.2  RBC 3.35* 3.13* 3.19*  HGB 9.2* 8.4* 8.6*  HCT 29.2* 27.2* 27.8*  MCV 87.2 86.9 87.1  MCH 27.5 26.8 27.0  MCHC 31.5 30.9 30.9  RDW 14.9 15.3 15.4  PLT 308 416* 435*  BNP Recent Labs  Lab 10/13/19 1016 10/16/19 0322  BNP 831.8* 767.1*     DDimer  Recent Labs  Lab 10/15/19 1024  DDIMER 3.64*     Radiology    CT ANGIO CHEST PE W OR WO CONTRAST  Result Date: 10/15/2019 CLINICAL DATA:  PE suspected, high pretest probability. Elevated D-dimer and shortness of breath. History of T-cell lymphoma, per earlier radiology reports. EXAM: CT ANGIOGRAPHY CHEST WITH CONTRAST TECHNIQUE: Multidetector CT imaging of the chest was performed using the standard protocol during bolus administration of intravenous contrast. Multiplanar CT image reconstructions and MIPs were obtained to evaluate the vascular anatomy. CONTRAST:  150mL OMNIPAQUE IOHEXOL 350 MG/ML SOLN COMPARISON:  Chest CT angiogram dated 08/25/2019. FINDINGS: Cardiovascular: Study is limited due to patient breathing motion artifact and attenuation artifact. Given these limitations, there is no convincing pulmonary embolism identified. No thoracic aortic aneurysm or evidence of aortic dissection. Cardiomegaly. No pericardial effusion. Mediastinum/Nodes: Grossly stable subcarinal lymphadenopathy.  Esophagus is unremarkable. Trachea is unremarkable. Lungs/Pleura: Ground-glass consolidations are present within the LEFT upper lobe, lingula and LEFT lower lobe. More confluent consolidation is present within the anterior aspects of the LEFT upper lobe. RIGHT pleural effusion, moderate to large in size, with adjacent atelectasis. Small LEFT pleural effusion. Upper Abdomen: Low-density material surrounding the pancreatic head and proximal pancreatic body, extending into the portacaval space, presumably patient's known abdominal lymphadenopathy, incompletely imaged at the lower portion of the study. Small amount of free fluid in the upper abdomen, as described on the earlier PET-CT report of 09/17/2019. w Musculoskeletal: No acute or suspicious osseous finding. Bilateral axillary lymphadenopathy, LEFT greater than RIGHT, as previously described. Review of the MIP images confirms the above findings. IMPRESSION: 1. No pulmonary embolism seen, but with fairly significant study limitations as detailed above. 2. New ground-glass opacities within the LEFT upper lobe, lingula and LEFT lower lobe. Differential includes atypical pneumonias such as viral (including COVID-19 pneumonia) or fungal, interstitial pneumonias, edema related to volume overload/CHF, chronic interstitial diseases, hypersensitivity pneumonitis, and respiratory bronchiolitis. 3. More confluent consolidation within the anterior aspects of the LEFT upper lobe, also suspicious for pneumonia. 4. RIGHT pleural effusion, moderate to large in size, with adjacent atelectasis. 5. Bilateral axillary lymphadenopathy, LEFT greater than RIGHT, as previously described, compatible with previous radiology reports describing T-cell lymphoma/ischemia. Also grossly stable subcarinal and upper abdominal lymphadenopathy. 6. Small amount of free fluid in the upper abdomen, as described on the earlier PET-CT report of 09/17/2019. 7. Cardiomegaly. These results will be called to  the ordering clinician or representative by the Radiologist Assistant, and communication documented in the PACS or zVision Dashboard. Electronically Signed   By: Franki Cabot M.D.   On: 10/15/2019 16:15   DG Chest Port 1 View  Result Date: 10/15/2019 CLINICAL DATA:  Shortness of breath EXAM: PORTABLE CHEST 1 VIEW COMPARISON:  10/08/2019 FINDINGS: Right Port-A-Cath remains in place, with the tip in the SVC. Cardiomegaly, vascular congestion. Improved aeration of the lungs. Minimal residual patchy opacities in both lungs. No visible significant effusions or acute bony abnormality. IMPRESSION: Improved aeration in the lungs. Minimal residual patchy bilateral airspace disease. No visible effusions. Cardiomegaly, vascular congestion. Electronically Signed   By: Rolm Baptise M.D.   On: 10/15/2019 09:25    Cardiac Studies   TTE 10/09/19: 1. Left ventricular ejection fraction, by visual estimation, is 45 to 50%. The left ventricle has mildly decreased function. There is no left ventricular hypertrophy. 2. Mild hypokinesis of the left ventricular, mid-apical anterior wall  and apical segment. 3. Definity contrast agent was given IV to delineate the left ventricular endocardial borders. 4. Left ventricular diastolic parameters are consistent with Grade I diastolic dysfunction (impaired relaxation). 5. Mild to moderately dilated left ventricular internal cavity size. 6. The left ventricle demonstrates regional wall motion abnormalities. 7. Global right ventricle has low normal systolic function.The right ventricular size is normal. No increase in right ventricular wall thickness. 8. Left atrial size was normal. 9. Right atrial size was normal. 10. The mitral valve is grossly normal. Trivial mitral valve regurgitation. 11. The tricuspid valve is grossly normal. Tricuspid valve regurgitation is mild. 12. The aortic valve is tricuspid. Aortic valve regurgitation is trivial. Mild aortic valve  sclerosis without stenosis. 13. The pulmonic valve was grossly normal. Pulmonic valve regurgitation is not visualized. 14. Aortic dilatation noted. 15. 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 16. Mildly elevated pulmonary artery systolic pressure. 17. The inferior vena cava is dilated in size with >50% respiratory variability, suggesting right atrial pressure of 8 mmHg.  TTE 09/12/19: 1. Left ventricular ejection fraction, by visual estimation, is 60 to 65%. The left ventricle has normal function. There is moderately increased left ventricular hypertrophy. 2. Definity contrast agent was given IV to delineate the left ventricular endocardial borders. 3. Left ventricular diastolic parameters are consistent with Grade I diastolic dysfunction (impaired relaxation). 4. The tricuspid valve is normal in structure. Tricuspid valve regurgitation is trivial. 5. The aortic valve is normal in structure. Aortic valve regurgitation is trivial. No evidence of aortic valve sclerosis or stenosis. 6. There is mild dilatation of the ascending aorta measuring 39 mm.   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  He presentedwith volume overload and BNP 1100. Rapid covid negative 12/16, PCR Covid negative 12/17.CXRshowedpatchy airspace opacities with layering bilateral effusions. He had baseline normal systolic function 123456, with repeat this admission 10/09/19 showing new mid-apical anterior WMA with EF 45-50%.He had recently started cycle 1 of chemotherapy on 09/29/19 (doxorubicin, Cytoxan, Adcetris). Worsening systolic function was felt  concerning for chemotoxicity.Low albumin  has been felt to also be contributing to volume overload. He received several doses of IV Lasix early in hospitalization, but subsequent diuresis was paused labile renal function and ongoing diarrhea.   On 10/15/19, developed worsening SOB, hypoxia requiring nasal cannula supplementation. Received 40mg  IV Lasix x1. Developed low grade fever (Tmax 102.4) and CTA findings as outlined below.  Assessment & Plan    Sepsis: febrile to 102.4 on 12/23.  CTA chest showed no PE, new groundglass opacities within the left upper and lower lobe, lingula, concern for COVID-19 pneumonia, edema related to volume overload, right moderate to large pleural effusion.  COVID-19 test pending - On IV vancomycin, cefepime  Acute systolic CHF - He had baseline normal systolic function 123456, with repeat this admission 10/09/19 showing new mid-apical anterior WMA with EF 45-50%.  Suspect due to chemotherapy. Can plan outpatient ischemia evaluation.  Diuresis was attempted but developed AKI, likely due to ongoing fluid losses through diarrhea -Will monitor and can restart diuresis as needed -Continue carvedilol, losartan  Elevated troponin- Troponinpeak869 (equivalent to roughly 0.8 by old parameters). No chest pain or ST changes on ECG to suggest ACS.Continue ASA asheme parametersallow. Continue statin,  BB.LDL at goal. Can plan ischemia evaluation as outpatient  Acute kidney injury-very labilerecently, as high as 3.58 on 12/8 in context of dehydration and hypotension. Peak was 1.82 this admission, has remained normal for last several days. Received contrast 12/23 with CTA. Continue to follow.  Stage IV angioimmunoblastic T-cell lymphoma withchemotherapy-induced pancytopenia- per oncology, s/p recent chemo.  6 cycles were originally planned per onc note 10/06/19. However, with question of chemo causing cardiotoxicity, oncology will need to be notified closer to  discharge for involvement in planning purposes.  HLD- continue statin.  Mild dilation of ascending aorta- given comorbidities, conservative management likely appropriate.Will likely be followed anyway on future echocardiograms.  Diarrhea - per primary team.  Hypocalcemia, hypokalemia - lyte management per primary team.  For questions or updates, please contact [place] Please consult [email or web address] for contact info under Cardiology/STEMI.  Signed, Donato Heinz, MD 10/17/2019, 7:04 AM

## 2019-10-17 NOTE — Progress Notes (Signed)
PHARMACY - PHYSICIAN COMMUNICATION CRITICAL VALUE ALERT - BLOOD CULTURE IDENTIFICATION (BCID)  [name] [name] is an 78 y.o. male who presented to [facility] on 10/08/2019 with a chief complaint of weakness  Assessment:  sepsis (include suspected source if known) 1 of 4 bottles [place]  Name of physician (or Provider) Contacted: Ardith Dark, NP  Current antibiotics: cefepime/vancomycin  Changes to prescribed antibiotics recommended:  Patient is on recommended antibiotics - No changes needed  No results found for this or any previous visit.  [name] 10/17/2019  1:29 AM

## 2019-10-17 NOTE — Progress Notes (Signed)
Pharmacy Antibiotic Note  [name] [name] is a 78 y.o. 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 Patient was febrile on 12/23. Pharmacy has been consulted to start vancomycin and cefepime for suspected sepsis.  Today, 10/17/19 -WBC WNL -SCr 1.11, CrCl ~ 75 mL/min -PCT 0.17 (decreasing) -Afebrile  Plan:  Continue cefepime 2 g IV q8h  Change vancomycin to 1500 mg IV q24h  Goal vancomycin AUC 400-550  Follow renal function and check vancomycin levels once at steady state if indicated  F/u culture data  Height: 6' (182.9 cm) Weight: 267 lb 6.7 oz (121.3 kg) IBW/kg (Calculated) : 77.6  Temp (24hrs), Avg:98.8 F (37.1 C), Min:98.6 F (37 C), Max:98.9 F (37.2 C)  Recent Labs  Lab 10/12/19 0514 10/13/19 0436 10/13/19 1016 10/14/19 0526 10/15/19 0435 10/15/19 1248 10/15/19 1444 10/16/19 0322 10/17/19 0402  WBC 3.4*  --  4.8  --   --   --   --  6.9 9.2  CREATININE 1.08  1.19 1.10  --  0.97 0.96 1.00  --  1.11  --   LATICACIDVEN  --   --   --   --   --  1.5 1.0  --   --     Estimated Creatinine Clearance: 75 mL/min (by C-G formula based on SCr of 1.11 mg/dL).    Allergies  Allergen Reactions  . Doxycycline Rash    Antimicrobials this admission:  12/23 vanc>> 12/23 cefepime>>  Dose adjustment: 12/25: vanc 1000 mg q12h --> 1500 mg IV q24h  Microbiology results:  12/23 BCx: 1/4 GPC, considered contaminant 12/18 GI pct: neg 12/17 cdiff pcr: neg 12/17 ucx: insignif growth FINAL  Lenis Noon, PharmD 10/17/2019 11:13 AM

## 2019-10-18 LAB — COMPREHENSIVE METABOLIC PANEL
ALT: 13 U/L (ref 0–44)
AST: 15 U/L (ref 15–41)
Albumin: 2.5 g/dL — ABNORMAL LOW (ref 3.5–5.0)
Alkaline Phosphatase: 67 U/L (ref 38–126)
Anion gap: 12 (ref 5–15)
BUN: 9 mg/dL (ref 8–23)
CO2: 22 mmol/L (ref 22–32)
Calcium: 7.5 mg/dL — ABNORMAL LOW (ref 8.9–10.3)
Chloride: 101 mmol/L (ref 98–111)
Creatinine, Ser: 0.82 mg/dL (ref 0.61–1.24)
GFR calc Af Amer: >60 mL/min (ref >60)
GFR calc non Af Amer: >60 mL/min (ref >60)
Glucose, Bld: 105 mg/dL — ABNORMAL HIGH (ref 70–99)
Potassium: 3.3 mmol/L — ABNORMAL LOW (ref 3.5–5.1)
Sodium: 135 mmol/L (ref 135–145)
Total Bilirubin: 0.5 mg/dL (ref 0.3–1.2)
Total Protein: 6.1 g/dL — ABNORMAL LOW (ref 6.5–8.1)

## 2019-10-18 LAB — CBC WITH DIFFERENTIAL/PLATELET
Abs Immature Granulocytes: 0.9 10*3/uL — ABNORMAL HIGH (ref 0.00–0.07)
Basophils Absolute: 0.1 10*3/uL (ref 0.0–0.1)
Basophils Relative: 2 %
Eosinophils Absolute: 0 10*3/uL (ref 0.0–0.5)
Eosinophils Relative: 0 %
HCT: 28.6 % — ABNORMAL LOW (ref 39.0–52.0)
Hemoglobin: 9 g/dL — ABNORMAL LOW (ref 13.0–17.0)
Immature Granulocytes: 12 %
Lymphocytes Relative: 10 %
Lymphs Abs: 0.7 10*3/uL (ref 0.7–4.0)
MCH: 27.4 pg (ref 26.0–34.0)
MCHC: 31.5 g/dL (ref 30.0–36.0)
MCV: 86.9 fL (ref 80.0–100.0)
Monocytes Absolute: 1 10*3/uL (ref 0.1–1.0)
Monocytes Relative: 13 %
Neutro Abs: 4.7 10*3/uL (ref 1.7–7.7)
Neutrophils Relative %: 63 %
Platelets: 408 10*3/uL — ABNORMAL HIGH (ref 150–400)
RBC: 3.29 MIL/uL — ABNORMAL LOW (ref 4.22–5.81)
RDW: 15.8 % — ABNORMAL HIGH (ref 11.5–15.5)
WBC: 7.4 10*3/uL (ref 4.0–10.5)
nRBC: 0 % (ref 0.0–0.2)

## 2019-10-18 LAB — GLUCOSE, CAPILLARY
Glucose-Capillary: 96 mg/dL (ref 70–99)
Glucose-Capillary: 172 mg/dL — ABNORMAL HIGH (ref 70–99)
Glucose-Capillary: 106 mg/dL — ABNORMAL HIGH (ref 70–99)

## 2019-10-18 MED ORDER — CHLORHEXIDINE GLUCONATE CLOTH 2 % EX PADS: 6.0000 | MEDICATED_PAD | Freq: Every day | CUTANEOUS | Status: DC

## 2019-10-18 MED ORDER — POTASSIUM CHLORIDE CRYS ER 20 MEQ PO TBCR
40.0000 meq | EXTENDED_RELEASE_TABLET | Freq: Once | ORAL | Status: AC
  Administered 2019-10-18: 40 meq via ORAL
  Filled 2019-10-18: qty 2

## 2019-10-18 NOTE — Progress Notes (Signed)
PROGRESS NOTE  [name] [name]  DOB: 1941/06/12  PCP: Colon Branch, MD [id number]  DOA: 10/08/2019  LOS: 9 days   Chief Complaint  Patient presents with  . 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  Patient was admitted with dyspnea, orthopnea, PND and worsening bilateral pedal edema (acute CHF). As per prior documentation "patient reports about 10 months of progressive shortness of breath.  He was diagnosed with stage IV angioimmunoblastic T-cell lymphoma in October 2020.  He had an echocardiogram on 09/12/2019 which showed an EF of 60-65% with grade 1 diastolic dysfunction.  He completed cycle 1 of chemotherapy with doxorubicin, Cytoxan, Adcetris on 09/29/2019. Since starting chemotherapy he has developed chemotherapy associated pancytopenia. In the ED, patient required 2 to 3 L oxygen via nasal cannula to maintain O2 sat. Labs notable for pancytopenia,  high-sensitivity troponin I 869, BNP 1106. Portable chest x-ray showed right Port-A-Cath in place and patchy interstitial and airspace opacities throughout both lungs with bilateral effusions. EKG showed sinus tachycardia, first-degree AV block, T wave flattening throughout, and low voltage. Hospitalist service was consulted to admit for further evaluation and management".  Subjective: Patient seen and examined at bedside.  Still having diarrhea, but reports improving.  Breathing improving, but not back to baseline yet.  Patient denies any chest pain, abdominal pain, nausea/vomiting, fever/chills.  Assessment/Plan: Acute respiratory failure with hypoxia  Acute systolic HF BNP is [id number].   Chest x-ray showed pulmonary edema.  Repeat echocardiogram on 12/7 showed a drop in EF to 45 to 50%  with mild hypokinesis of the left ventricle, grade 1 diastolic dysfunction. Cardiology on board, was initially on IV Lasix twice daily, due to rise in creatinine was held, 1 dose given today on 10/15/2019 Continue Coreg, losartan Continue to monitor on telemetry. Monitor strict I/O's, daily weights Continue supplemental oxygen as needed  Elevated troponin New drop in ejection fraction High-sensitivity troponin I 869. Suspect demand ischemia from above, continue to trend. Cardiology on board, further work-up on outpatient basis.  Chronic diarrhea Ongoing C. Difficile/GI panel negative Likely related to chemotherapy Imodium prn, probiotics  Sepsis likely 2/2 HCAP Noted to be febrile, temp spike of 102.4 on 12/23, tachypneic No further fever episodes Lactic acid WNL Procalcitonin 0.21--> 0.28--> 0.17 CRP 6.8, ferritin 428, trend BC x2 grew 1/4 GPC, likely contaminant UA/UC pending Repeat Covid PCR/flu negative, send out COVID negative Chest x-ray showed minimal residual patchy bilateral airspace disease, vascular congestion CTA chest showed no PE, new groundglass opacities within the left upper and lower lobe, lingula, concern for COVID-19 pneumonia, edema related to volume overload, right moderate to large pleural effusion D/V IV vancomycin, continue cefepime  Monitor closely  Elevated D-dimer Likely 2/2 malignancy CTA chest negative for PE, report as above  Stage IV angioimmunoblastic T-cell lymphoma with chemotherapy-induced pancytopenia: Follows with oncology, Dr. Maylon Peppers.  Completed cycle 1 of doxorubicin, Cytoxan, and Adcetris on 09/29/2019  Hypertension Continue Coreg, Losartan  Type 2 diabetes SSI, Accu-Cheks, hypoglycemic protocol On Metformin as an outpatient.  A1c is 6.1  Hyperlipidemia Continue statin  Obesity Lifestyle modification advised        DVT prophylaxis:  Heparin Code Status:  DNR Family Communication:  None at  bedside  Consultants:  Cardiology  Procedures:  None  Antimicrobials: Anti-infectives (From admission, onward)   Start  Dose/Rate Route Frequency Ordered Stop   10/17/19 1400  vancomycin (VANCOREADY) IVPB 1500 mg/300 mL  Status:  Discontinued     1,500 mg 150 mL/hr over 120 Minutes Intravenous Every 24 hours 10/17/19 1110 10/18/19 0823   10/16/19 0200  vancomycin (VANCOCIN) IVPB 1000 mg/200 mL premix  Status:  Discontinued     1,000 mg 200 mL/hr over 60 Minutes Intravenous Every 12 hours 10/15/19 1240 10/17/19 1110   10/15/19 2100  ceFEPIme (MAXIPIME) 2 g in sodium chloride 0.9 % 100 mL IVPB     2 g 200 mL/hr over 30 Minutes Intravenous Every 8 hours 10/15/19 1240     10/15/19 1245  vancomycin (VANCOCIN) 2,500 mg in sodium chloride 0.9 % 500 mL IVPB     2,500 mg 250 mL/hr over 120 Minutes Intravenous  Once 10/15/19 1238 10/15/19 1804   10/15/19 1245  ceFEPIme (MAXIPIME) 2 g in sodium chloride 0.9 % 100 mL IVPB     2 g 200 mL/hr over 30 Minutes Intravenous  Once 10/15/19 1238 10/15/19 1411        Code Status: DNR   Diet Order            Diet heart healthy/carb modified Room service appropriate? Yes; Fluid consistency: Thin; Fluid restriction: 1500 mL Fluid  Diet effective now              Infusions:  . sodium chloride Stopped (10/17/19 1718)  . ceFEPime (MAXIPIME) IV 2 g (10/18/19 1214)    Scheduled Meds: . aspirin EC  81 mg Oral Daily  . atorvastatin  40 mg Oral q1800  . carvedilol  12.5 mg Oral BID WC  . feeding supplement (ENSURE ENLIVE)  237 mL Oral BID BM  . heparin injection (subcutaneous)  5,000 Units Subcutaneous Q8H  . insulin aspart  0-9 Units Subcutaneous TID WC  . losartan  50 mg Oral Daily  . multivitamin with minerals  1 tablet Oral Daily  . saccharomyces boulardii  250 mg Oral BID    PRN meds: sodium chloride, acetaminophen, ipratropium-albuterol, loperamide, ondansetron (ZOFRAN) IV   Objective: Vitals:   10/18/19 0559 10/18/19 1404   BP: (!) 132/55 (!) 123/52  Pulse: 63 61  Resp: 20 20  Temp: 97.7 F (36.5 C) 98.1 F (36.7 C)  SpO2: 98% 93%    Intake/Output Summary (Last 24 hours) at 10/18/2019 1652 Last data filed at 10/18/2019 0558 Gross per 24 hour  Intake 440 ml  Output 800 ml  Net -360 ml   Filed Weights   10/16/19 0500 10/17/19 0441 10/18/19 0559  Weight: 121.4 kg 121.3 kg 121 kg   Weight change: -0.3 kg Body mass index is 36.18 kg/m.   Physical Exam:  General: NAD  Cardiovascular: S1, S2 present  Respiratory:  Decreased air entry bilaterally  Abdomen: Soft, nontender, nondistended, bowel sounds present  Musculoskeletal: No bilateral pedal edema noted  Skin: Normal  Psychiatry: Normal mood    Data Review: I have personally reviewed the laboratory data and studies available.  Recent Labs  Lab 10/12/19 0514 10/13/19 1016 10/16/19 0322 10/17/19 0402 10/18/19 0348  WBC 3.4* 4.8 6.9 9.2 7.4  NEUTROABS 2.4  --  3.8 6.1 4.7  HGB 8.6* 9.2* 8.4* 8.6* 9.0*  HCT 27.2* 29.2* 27.2* 27.8* 28.6*  MCV 86.6 87.2 86.9 87.1 86.9  PLT 200 308 416* 435* 408*   Recent Labs  Lab 10/12/19 0514 10/15/19 0435 10/15/19 1248 10/16/19 0322 10/17/19 0402 10/18/19 0348  NA 136  136 136  133* 133* 132* 135  K 3.5  3.5 4.1 3.7 3.1* 3.7 3.3*  CL 104  103 105 102 101 101 101  CO2 22  23 21* 24 21* 16* 22  GLUCOSE 112*  111* 105* 119* 107* 92 105*  BUN 30*  30* 12 10 12 10 9   CREATININE 1.08  1.19 0.96 1.00 1.11 0.84 0.82  CALCIUM 7.6*  7.7* 7.6* 7.5* 7.1* 7.2* 7.5*  MG 2.2  --   --   --   --   --   PHOS 2.8  --   --   --   --   --     Alma Friendly, MD  [facility] 10/18/2019

## 2019-10-18 NOTE — Progress Notes (Signed)
Progress Note  Patient Name: [name] [name] Date of Encounter: 10/18/2019  Primary Cardiologist: Mertie Moores, MD   Subjective   Has not had another bout of diarrhea.  Feels little bit better.  Breathing still not quite back at baseline.  Inpatient Medications    Scheduled Meds: . aspirin EC  81 mg Oral Daily  . atorvastatin  40 mg Oral q1800  . carvedilol  12.5 mg Oral BID WC  . feeding supplement (ENSURE ENLIVE)  237 mL Oral BID BM  . heparin injection (subcutaneous)  5,000 Units Subcutaneous Q8H  . insulin aspart  0-9 Units Subcutaneous TID WC  . losartan  50 mg Oral Daily  . multivitamin with minerals  1 tablet Oral Daily  . saccharomyces boulardii  250 mg Oral BID   Continuous Infusions: . sodium chloride Stopped (10/17/19 1718)  . ceFEPime (MAXIPIME) IV 2 g (10/18/19 0557)  . vancomycin 1,500 mg (10/17/19 1311)   PRN Meds: sodium chloride, acetaminophen, ipratropium-albuterol, loperamide, ondansetron (ZOFRAN) IV   Vital Signs    Vitals:   10/17/19 0732 10/17/19 1143 10/17/19 2059 10/18/19 0559  BP:  (!) 144/78 (!) 126/55 (!) 132/55  Pulse: 62 60 (!) 59 63  Resp:  16 16 20   Temp:  98.2 F (36.8 C) 98.5 F (36.9 C) 97.7 F (36.5 C)  TempSrc:   Oral   SpO2:  96% 93% 98%  Weight:    121 kg  Height:        Intake/Output Summary (Last 24 hours) at 10/18/2019 0812 Last data filed at 10/18/2019 0558 Gross per 24 hour  Intake 1005.44 ml  Output 1500 ml  Net -494.56 ml   Last 3 Weights 10/18/2019 10/17/2019 10/16/2019  Weight (lbs) 266 lb 12.1 oz 267 lb 6.7 oz 267 lb 10.2 oz  Weight (kg) 121 kg 121.3 kg 121.4 kg      Telemetry    Normal sinus rhythm rare PACs noted.- Personally Reviewed  ECG    10/08/2019-sinus tachycardia 99 - Personally Reviewed  Physical Exam   GEN: No acute distress, moderately ill-appearing.   Neck: No JVD Cardiac: RRR, no murmurs, rubs, or gallops.  Respiratory:  Coarse breath sounds bilaterally. GI: Soft, nontender,  non-distended  MS:  1+ bilateral lower extremity edema; No deformity. Neuro:  Nonfocal  Psych: Normal affect   Labs    High Sensitivity Troponin:   Recent Labs  Lab 10/08/19 2211 10/09/19 0011  TROPONINIHS 869* 862*      Chemistry Recent Labs  Lab 10/16/19 0322 10/17/19 0402 10/18/19 0348  NA 133* 132* 135  K 3.1* 3.7 3.3*  CL 101 101 101  CO2 21* 16* 22  GLUCOSE 107* 92 105*  BUN 12 10 9   CREATININE 1.11 0.84 0.82  CALCIUM 7.1* 7.2* 7.5*  PROT 5.8* 6.2* 6.1*  ALBUMIN 2.6* 2.6* 2.5*  AST 17 20 15   ALT 15 13 13   ALKPHOS 58 62 67  BILITOT 0.6 0.6 0.5  GFRNONAA >60 >60 >60  GFRAA >60 >60 >60  ANIONGAP 11 15 12      Hematology Recent Labs  Lab 10/16/19 0322 10/17/19 0402 10/18/19 0348  WBC 6.9 9.2 7.4  RBC 3.13* 3.19* 3.29*  HGB 8.4* 8.6* 9.0*  HCT 27.2* 27.8* 28.6*  MCV 86.9 87.1 86.9  MCH 26.8 27.0 27.4  MCHC 30.9 30.9 31.5  RDW 15.3 15.4 15.8*  PLT 416* 435* 408*    BNP Recent Labs  Lab 10/13/19 1016 10/16/19 0322  BNP 831.8* 767.1*  DDimer  Recent Labs  Lab 10/15/19 1024  DDIMER 3.64*     Radiology    No results found.  Cardiac Studies   On 10/09/2019-EF 45 to 50% with mid apical anterior wall and apical segment hypokinesis.  Aortic root 40 mm.  Patient Profile     78 y.o. male seen for new mid apical anterior wall motion abnormality on echocardiogram with EF of 45 to 50% on doxorubicin with low albumin, sepsis, fever, pneumonia, recent AKI.  Assessment & Plan    Acute systolic heart failure -EF 45 to 50% with new mid apical anterior wall motion abnormality.  Possibly secondary to chemotherapy.  Try diuresis earlier but AKI developed.  Ongoing fluid losses through diarrhea as well. -Continue with carvedilol, losartan and utilize diuresis as needed.  Covid test was negative.  Sepsis -Fever of 102.4 on December 23 no pulmonary embolism.  There was concern on CT scan for COVID-19 pneumonia with right moderate to large pleural  effusion.  Demand ischemia -Troponin peak was 869 with no chest discomfort no ST changes of ischemia.  Likely demand ischemia in the setting of underlying pneumonia.  Continue with current statin beta-blockers.  As an outpatient, can consider ischemic evaluation.   Acute kidney injury -Creatinine 3.5 on 12/8 with hypotension and dehydration.  On this admission, speak was 1.82.  Received contrast on December 23 with CT scan.  Monitoring.  Stage IV T-cell lymphoma -Associated pancytopenia, oncology there was question of chemo-induced cardiotoxicity.  Per prior note, oncology will need to be notified closer to discharge for involvement and planning purposes.  Mild dilation of ascending aorta -Follow-up with future echocardiograms.  Conservative management at this time.      For questions or updates, please contact [facility] Please consult [email or web address] for contact info under        Signed, Candee Furbish, MD  10/18/2019, 8:12 AM

## 2019-10-19 LAB — CULTURE, BLOOD (ROUTINE X 2): Special Requests: ADEQUATE

## 2019-10-19 LAB — COMPREHENSIVE METABOLIC PANEL
ALT: 13 U/L (ref 0–44)
AST: 17 U/L (ref 15–41)
Albumin: 2.6 g/dL — ABNORMAL LOW (ref 3.5–5.0)
Alkaline Phosphatase: 64 U/L (ref 38–126)
Anion gap: 14 (ref 5–15)
BUN: 10 mg/dL (ref 8–23)
CO2: 22 mmol/L (ref 22–32)
Calcium: 7 mg/dL — ABNORMAL LOW (ref 8.9–10.3)
Chloride: 98 mmol/L (ref 98–111)
Creatinine, Ser: 0.84 mg/dL (ref 0.61–1.24)
GFR calc Af Amer: >60 mL/min (ref >60)
GFR calc non Af Amer: >60 mL/min (ref >60)
Glucose, Bld: 114 mg/dL — ABNORMAL HIGH (ref 70–99)
Potassium: 4.3 mmol/L (ref 3.5–5.1)
Sodium: 134 mmol/L — ABNORMAL LOW (ref 135–145)
Total Bilirubin: 0.8 mg/dL (ref 0.3–1.2)
Total Protein: 6.4 g/dL — ABNORMAL LOW (ref 6.5–8.1)

## 2019-10-19 LAB — CBC WITH DIFFERENTIAL/PLATELET
Abs Immature Granulocytes: 0.65 10*3/uL — ABNORMAL HIGH (ref 0.00–0.07)
Basophils Absolute: 0.1 10*3/uL (ref 0.0–0.1)
Basophils Relative: 2 %
Eosinophils Absolute: 0 10*3/uL (ref 0.0–0.5)
Eosinophils Relative: 0 %
HCT: 26.8 % — ABNORMAL LOW (ref 39.0–52.0)
Hemoglobin: 8.6 g/dL — ABNORMAL LOW (ref 13.0–17.0)
Immature Granulocytes: 8 %
Lymphocytes Relative: 8 %
Lymphs Abs: 0.7 10*3/uL (ref 0.7–4.0)
MCH: 27.7 pg (ref 26.0–34.0)
MCHC: 32.1 g/dL (ref 30.0–36.0)
MCV: 86.2 fL (ref 80.0–100.0)
Monocytes Absolute: 1.1 10*3/uL — ABNORMAL HIGH (ref 0.1–1.0)
Monocytes Relative: 14 %
Neutro Abs: 5.6 10*3/uL (ref 1.7–7.7)
Neutrophils Relative %: 68 %
Platelets: 449 10*3/uL — ABNORMAL HIGH (ref 150–400)
RBC: 3.11 MIL/uL — ABNORMAL LOW (ref 4.22–5.81)
RDW: 15.9 % — ABNORMAL HIGH (ref 11.5–15.5)
WBC: 8.2 10*3/uL (ref 4.0–10.5)
nRBC: 0.2 % (ref 0.0–0.2)

## 2019-10-19 LAB — GLUCOSE, CAPILLARY
Glucose-Capillary: 96 mg/dL (ref 70–99)
Glucose-Capillary: 128 mg/dL — ABNORMAL HIGH (ref 70–99)
Glucose-Capillary: 126 mg/dL — ABNORMAL HIGH (ref 70–99)
Glucose-Capillary: 104 mg/dL — ABNORMAL HIGH (ref 70–99)

## 2019-10-19 MED ORDER — LOSARTAN POTASSIUM 50 MG PO TABS
100.0000 mg | ORAL_TABLET | Freq: Every day | ORAL | Status: DC
  Administered 2019-10-19 – 2019-10-20 (×2): 100 mg via ORAL
  Filled 2019-10-19 (×2): qty 2

## 2019-10-19 NOTE — Progress Notes (Signed)
Progress Note  Patient Name: [name] [name] Date of Encounter: 10/19/2019  Primary Cardiologist: Mertie Moores, MD   Subjective   Sleeping comfortably, no complaints  Inpatient Medications    Scheduled Meds: . aspirin EC  81 mg Oral Daily  . atorvastatin  40 mg Oral q1800  . carvedilol  12.5 mg Oral BID WC  . Chlorhexidine Gluconate Cloth  6 each Topical Daily  . feeding supplement (ENSURE ENLIVE)  237 mL Oral BID BM  . heparin injection (subcutaneous)  5,000 Units Subcutaneous Q8H  . insulin aspart  0-9 Units Subcutaneous TID WC  . losartan  50 mg Oral Daily  . multivitamin with minerals  1 tablet Oral Daily  . saccharomyces boulardii  250 mg Oral BID   Continuous Infusions: . sodium chloride Stopped (10/17/19 1718)  . ceFEPime (MAXIPIME) IV 2 g (10/19/19 0512)   PRN Meds: sodium chloride, acetaminophen, ipratropium-albuterol, loperamide, ondansetron (ZOFRAN) IV   Vital Signs    Vitals:   10/18/19 2009 10/19/19 0500 10/19/19 0535 10/19/19 0645  BP: (!) 147/77  (!) 145/73 (!) 169/80  Pulse: 65  70 61  Resp: (!) 22  20 16   Temp: 98.2 F (36.8 C)  98.8 F (37.1 C) 98.8 F (37.1 C)  TempSrc:   Oral Oral  SpO2: 97%  97% 94%  Weight:  122.8 kg    Height:        Intake/Output Summary (Last 24 hours) at 10/19/2019 0755 Last data filed at 10/18/2019 1800 Gross per 24 hour  Intake 390.83 ml  Output --  Net 390.83 ml   Last 3 Weights 10/19/2019 10/18/2019 10/17/2019  Weight (lbs) 270 lb 11.6 oz 266 lb 12.1 oz 267 lb 6.7 oz  Weight (kg) 122.8 kg 121 kg 121.3 kg      Telemetry    Normal sinus rhythm rare PACs noted, no change.- Personally Reviewed  ECG    10/08/2019-sinus tachycardia 99 - Personally Reviewed  Physical Exam   GEN: Well nourished, well developed, in no acute distress  HEENT: normal  Neck: no JVD, carotid bruits, or masses Cardiac: RRR; no murmurs, rubs, or gallops, mild LE edema  Respiratory:  clear to auscultation bilaterally, normal  work of breathing GI: soft, nontender, nondistended, + BS MS: no deformity or atrophy  Skin: warm and dry, no rash    Labs    High Sensitivity Troponin:   Recent Labs  Lab 10/08/19 2211 10/09/19 0011  TROPONINIHS 869* 862*      Chemistry Recent Labs  Lab 10/17/19 0402 10/18/19 0348 10/19/19 0417  NA 132* 135 134*  K 3.7 3.3* 4.3  CL 101 101 98  CO2 16* 22 22  GLUCOSE 92 105* 114*  BUN 10 9 10   CREATININE 0.84 0.82 0.84  CALCIUM 7.2* 7.5* 7.0*  PROT 6.2* 6.1* 6.4*  ALBUMIN 2.6* 2.5* 2.6*  AST 20 15 17   ALT 13 13 13   ALKPHOS 62 67 64  BILITOT 0.6 0.5 0.8  GFRNONAA >60 >60 >60  GFRAA >60 >60 >60  ANIONGAP 15 12 14      Hematology Recent Labs  Lab 10/17/19 0402 10/18/19 0348 10/19/19 0417  WBC 9.2 7.4 8.2  RBC 3.19* 3.29* 3.11*  HGB 8.6* 9.0* 8.6*  HCT 27.8* 28.6* 26.8*  MCV 87.1 86.9 86.2  MCH 27.0 27.4 27.7  MCHC 30.9 31.5 32.1  RDW 15.4 15.8* 15.9*  PLT 435* 408* 449*    BNP Recent Labs  Lab 10/13/19 1016 10/16/19 0322  BNP 831.8*  767.1*     DDimer  Recent Labs  Lab 10/15/19 1024  DDIMER 3.64*     Radiology    No results found.  Cardiac Studies   On 10/09/2019-EF 45 to 50% with mid apical anterior wall and apical segment hypokinesis.  Aortic root 40 mm.  Patient Profile     78 y.o. male seen for new mid apical anterior wall motion abnormality on echocardiogram with EF of 45 to 50% on doxorubicin with low albumin, sepsis, fever, pneumonia, recent AKI.  Assessment & Plan    Acute systolic heart failure -EF 45 to 50% with new mid apical anterior wall motion abnormality.  Possibly secondary to chemotherapy.  Try diuresis earlier but AKI developed.  Ongoing fluid losses through diarrhea as well. -Continue with carvedilol, losartan (will increase to 100 mg a day to help improve blood pressure) and utilize diuresis as needed.  Covid test was negative.  Sepsis -Fever of 102.4 on December 23 no pulmonary embolism.  There was concern on  CT scan for COVID-19 pneumonia with right moderate to large pleural effusion.  Improving  Demand ischemia -Troponin peak was 869 with no chest discomfort no ST changes of ischemia.  Likely demand ischemia in the setting of underlying pneumonia.  Continue with current statin beta-blockers.  As an outpatient, can consider ischemic evaluation if symptoms become apparent.  Otherwise continue to treat medically.  Acute kidney injury -Creatinine 3.5 on 12/8 with hypotension and dehydration.  On this admission, speak was 1.82.  Received contrast on December 23 with CT scan.  Monitoring.  Creatinine now back to normal, 0.84.  Increasing losartan to 100 mg for improved blood pressure control  Stage IV T-cell lymphoma -Associated pancytopenia, oncology there was question of chemo-induced cardiotoxicity.  Per prior note, oncology will need to be notified closer to discharge for involvement and planning purposes.  Mild dilation of ascending aorta -Follow-up with future echocardiograms.  Conservative management.  We will go ahead and sign off.  Please let us know if we can be of further assistance.  For questions or updates, please contact [place] Please consult [email or web address] for contact info under        Signed, Candee Furbish, MD  10/19/2019, 7:55 AM

## 2019-10-19 NOTE — Plan of Care (Signed)
  Problem: Activity: Goal: Risk for activity intolerance will decrease Outcome: Progressing   Problem: Nutrition: Goal: Adequate nutrition will be maintained Outcome: Progressing   

## 2019-10-19 NOTE — Progress Notes (Addendum)
PROGRESS NOTE  [name] [name]  DOB: June 25, 1941  PCP: Colon [facility], MD [id number]  DOA: 10/08/2019  LOS: 10 days   Chief Complaint  Patient presents with  . 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  Patient was admitted with dyspnea, orthopnea, PND and worsening bilateral pedal edema (acute CHF). As per prior documentation "patient reports about 10 months of progressive shortness of breath.  He was diagnosed with stage IV angioimmunoblastic T-cell lymphoma in October 2020.  He had an echocardiogram on 09/12/2019 which showed an EF of 60-65% with grade 1 diastolic dysfunction.  He completed cycle 1 of chemotherapy with doxorubicin, Cytoxan, Adcetris on 09/29/2019. Since starting chemotherapy he has developed chemotherapy associated pancytopenia. In the ED, patient required 2 to 3 L oxygen via nasal cannula to maintain O2 sat. Labs notable for pancytopenia,  high-sensitivity troponin I 869, BNP 1106. Portable chest x-ray showed right Port-A-Cath in place and patchy interstitial and airspace opacities throughout both lungs with bilateral effusions. EKG showed sinus tachycardia, first-degree AV block, T wave flattening throughout, and low voltage. Hospitalist service was consulted to admit for further evaluation and management".    Subjective: Patient seen and examined at bedside.  Patient reports feeling better, although noted to be slightly short of breath today but denies its worsening.  Patient also reports improved diarrhea.  Denies any chest pain, abdominal pain, nausea/vomiting, fever/chills.    Assessment/Plan: Acute respiratory failure with hypoxia  Acute systolic HF BNP is [id number].   Chest x-ray showed pulmonary edema, repeat  shows improved Still requiring supplemental oxygen, plan to wean off Repeat echocardiogram on 12/7 showed a drop in EF to 45 to 50% with mild hypokinesis of the left ventricle, grade 1 diastolic dysfunction. Cardiology on board, was initially on IV Lasix twice daily, due to rise in creatinine was held, last dose given today on 10/15/2019 Continue Coreg, losartan Continue to monitor on telemetry. Monitor strict I/O's, daily weights Home O2 eval  Elevated troponin New drop in ejection fraction High-sensitivity troponin I 869. Suspect demand ischemia from above, continue to trend. Cardiology on board, further work-up on outpatient basis.  Chronic diarrhea Improving C. Difficile/GI panel negative Likely related to chemotherapy Imodium prn, probiotics  Sepsis likely 2/2 HCAP Noted to be febrile, temp spike of 102.4 on 12/23, tachypneic No further fever episodes Lactic acid WNL Procalcitonin 0.21--> 0.28--> 0.17 CRP 6.8, ferritin 428, trend BC x2 grew 1/4 GPC, likely contaminant Repeat Covid PCR/flu negative, send out COVID negative Chest x-ray showed minimal residual patchy bilateral airspace disease, vascular congestion CTA chest showed no PE, new groundglass opacities within the left upper and lower lobe, lingula, concern for COVID-19 pneumonia, edema related to volume overload, right moderate to large pleural effusion D/V IV vancomycin, continue cefepime  Monitor closely  Hypocalcemia Ionized calcium pending Daily BMP  Elevated D-dimer Likely 2/2 malignancy CTA chest negative for PE, report as above  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 Maylon Peppers.  Completed cycle 1 of doxorubicin, Cytoxan, and Adcetris on 09/29/2019  Hypertension Continue Coreg, Losartan  Type 2 diabetes SSI, Accu-Cheks, hypoglycemic protocol On Metformin as an outpatient.  A1c is 6.1  Hyperlipidemia Continue  statin  Obesity Lifestyle modification advised        DVT prophylaxis:  Heparin Code Status:  DNR Family Communication:  None at bedside  Consultants:  Cardiology  Procedures:  None  Antimicrobials: Anti-infectives (From admission, onward)   Start     Dose/Rate Route Frequency Ordered Stop   10/17/19 1400  vancomycin (VANCOREADY) IVPB 1500 mg/300 mL  Status:  Discontinued     1,500 mg 150 mL/hr over 120 Minutes Intravenous Every 24 hours 10/17/19 1110 10/18/19 0823   10/16/19 0200  vancomycin (VANCOCIN) IVPB 1000 mg/200 mL premix  Status:  Discontinued     1,000 mg 200 mL/hr over 60 Minutes Intravenous Every 12 hours 10/15/19 1240 10/17/19 1110   10/15/19 2100  ceFEPIme (MAXIPIME) 2 g in sodium chloride 0.9 % 100 mL IVPB     2 g 200 mL/hr over 30 Minutes Intravenous Every 8 hours 10/15/19 1240     10/15/19 1245  vancomycin (VANCOCIN) 2,500 mg in sodium chloride 0.9 % 500 mL IVPB     2,500 mg 250 mL/hr over 120 Minutes Intravenous  Once 10/15/19 1238 10/15/19 1804   10/15/19 1245  ceFEPIme (MAXIPIME) 2 g in sodium chloride 0.9 % 100 mL IVPB     2 g 200 mL/hr over 30 Minutes Intravenous  Once 10/15/19 1238 10/15/19 1411        Code Status: DNR   Diet Order            Diet heart healthy/carb modified Room service appropriate? Yes; Fluid consistency: Thin; Fluid restriction: 1500 mL Fluid  Diet effective now              Infusions:  . sodium chloride Stopped (10/17/19 1718)  . ceFEPime (MAXIPIME) IV 2 g (10/19/19 0512)    Scheduled Meds: . aspirin EC  81 mg Oral Daily  . atorvastatin  40 mg Oral q1800  . carvedilol  12.5 mg Oral BID WC  . Chlorhexidine Gluconate Cloth  6 each Topical Daily  . feeding supplement (ENSURE ENLIVE)  237 mL Oral BID BM  . heparin injection (subcutaneous)  5,000 Units Subcutaneous Q8H  . insulin aspart  0-9 Units Subcutaneous TID WC  . losartan  100 mg Oral Daily  . multivitamin with minerals  1 tablet Oral Daily  .  saccharomyces boulardii  250 mg Oral BID    PRN meds: sodium chloride, acetaminophen, ipratropium-albuterol, loperamide, ondansetron (ZOFRAN) IV   Objective: Vitals:   10/19/19 0535 10/19/19 0645  BP: (!) 145/73 (!) 169/80  Pulse: 70 61  Resp: 20 16  Temp: 98.8 F (37.1 C) 98.8 F (37.1 C)  SpO2: 97% 94%    Intake/Output Summary (Last 24 hours) at 10/19/2019 1202 Last data filed at 10/18/2019 1800 Gross per 24 hour  Intake 247.54 ml  Output --  Net 247.54 ml   Filed Weights   10/17/19 0441 10/18/19 0559 10/19/19 0500  Weight: 121.3 kg 121 kg 122.8 kg   Weight change: 1.8 kg Body mass index is 36.72 kg/m.   Physical Exam:  General: NAD   Cardiovascular: S1, S2 present  Respiratory:  Diminished breath sounds bilaterally  Abdomen: Soft, nontender, nondistended, bowel sounds present  Musculoskeletal: No bilateral pedal edema noted  Skin: Normal  Psychiatry: Normal mood   Data Review: I have personally reviewed the laboratory data and studies available.  Recent Labs  Lab 10/13/19 1016 10/16/19 0322 10/17/19 0402 10/18/19 0348 10/19/19 0417  WBC 4.8 6.9 9.2 7.4 8.2  NEUTROABS  --  3.8 6.1 4.7 5.6  HGB 9.2* 8.4* 8.6* 9.0* 8.6*  HCT 29.2* 27.2* 27.8* 28.6* 26.8*  MCV 87.2 86.9 87.1 86.9 86.2  PLT 308 416* 435* 408* 449*   Recent Labs  Lab 10/15/19 1248 10/16/19 0322 10/17/19 0402 10/18/19 0348 10/19/19 0417  NA 133* 133* 132* 135 134*  K 3.7 3.1* 3.7 3.3* 4.3  CL 102 101 101 101 98  CO2 24 21* 16* 22 22  GLUCOSE 119* 107* 92 105* 114*  BUN 10 12 10 9 10   CREATININE 1.00 1.11 0.84 0.82 0.84  CALCIUM 7.5* 7.1* 7.2* 7.5* 7.0*    Alma Friendly, MD  [facility] 10/19/2019

## 2019-10-20 ENCOUNTER — Inpatient Hospital Stay: Payer: HMO

## 2019-10-20 ENCOUNTER — Inpatient Hospital Stay: Payer: HMO | Admitting: Hematology

## 2019-10-20 ENCOUNTER — Other Ambulatory Visit: Payer: Self-pay

## 2019-10-20 DIAGNOSIS — E785 Hyperlipidemia, unspecified: Secondary | ICD-10-CM

## 2019-10-20 DIAGNOSIS — E1169 Type 2 diabetes mellitus with other specified complication: Secondary | ICD-10-CM

## 2019-10-20 LAB — CBC WITH DIFFERENTIAL/PLATELET
Abs Immature Granulocytes: 0.56 10*3/uL — ABNORMAL HIGH (ref 0.00–0.07)
Basophils Absolute: 0.2 10*3/uL — ABNORMAL HIGH (ref 0.0–0.1)
Basophils Relative: 2 %
Eosinophils Absolute: 0.1 10*3/uL (ref 0.0–0.5)
Eosinophils Relative: 1 %
HCT: 29.1 % — ABNORMAL LOW (ref 39.0–52.0)
Hemoglobin: 9.3 g/dL — ABNORMAL LOW (ref 13.0–17.0)
Immature Granulocytes: 7 %
Lymphocytes Relative: 9 %
Lymphs Abs: 0.7 10*3/uL (ref 0.7–4.0)
MCH: 27.6 pg (ref 26.0–34.0)
MCHC: 32 g/dL (ref 30.0–36.0)
MCV: 86.4 fL (ref 80.0–100.0)
Monocytes Absolute: 1.2 10*3/uL — ABNORMAL HIGH (ref 0.1–1.0)
Monocytes Relative: 15 %
Neutro Abs: 5.4 10*3/uL (ref 1.7–7.7)
Neutrophils Relative %: 66 %
Platelets: 432 10*3/uL — ABNORMAL HIGH (ref 150–400)
RBC: 3.37 MIL/uL — ABNORMAL LOW (ref 4.22–5.81)
RDW: 16.1 % — ABNORMAL HIGH (ref 11.5–15.5)
WBC: 8.2 10*3/uL (ref 4.0–10.5)
nRBC: 0.2 % (ref 0.0–0.2)

## 2019-10-20 LAB — CULTURE, BLOOD (ROUTINE X 2)
Culture: NO GROWTH
Special Requests: ADEQUATE

## 2019-10-20 LAB — COMPREHENSIVE METABOLIC PANEL
ALT: 20 U/L (ref 0–44)
AST: 30 U/L (ref 15–41)
Albumin: 2.7 g/dL — ABNORMAL LOW (ref 3.5–5.0)
Alkaline Phosphatase: 76 U/L (ref 38–126)
Anion gap: 10 (ref 5–15)
BUN: 10 mg/dL (ref 8–23)
CO2: 22 mmol/L (ref 22–32)
Calcium: 7.7 mg/dL — ABNORMAL LOW (ref 8.9–10.3)
Chloride: 104 mmol/L (ref 98–111)
Creatinine, Ser: 0.73 mg/dL (ref 0.61–1.24)
GFR calc Af Amer: >60 mL/min (ref >60)
GFR calc non Af Amer: >60 mL/min (ref >60)
Glucose, Bld: 113 mg/dL — ABNORMAL HIGH (ref 70–99)
Potassium: 3.5 mmol/L (ref 3.5–5.1)
Sodium: 136 mmol/L (ref 135–145)
Total Bilirubin: 0.9 mg/dL (ref 0.3–1.2)
Total Protein: 6.5 g/dL (ref 6.5–8.1)

## 2019-10-20 LAB — GLUCOSE, CAPILLARY
Glucose-Capillary: 107 mg/dL — ABNORMAL HIGH (ref 70–99)
Glucose-Capillary: 150 mg/dL — ABNORMAL HIGH (ref 70–99)

## 2019-10-20 MED ORDER — FUROSEMIDE 40 MG PO TABS: 40.0000 mg | ORAL_TABLET | Freq: Every day | ORAL | 11 refills | Status: DC | PRN

## 2019-10-20 MED ORDER — AMOXICILLIN-POT CLAVULANATE 875-125 MG PO TABS: 1.0000 | ORAL_TABLET | Freq: Two times a day (BID) | ORAL | 0 refills | Status: AC

## 2019-10-20 MED ORDER — BOOST / RESOURCE BREEZE PO LIQD CUSTOM: 1.0000 | Freq: Two times a day (BID) | ORAL | Status: DC

## 2019-10-20 NOTE — Discharge Summary (Signed)
Physician Discharge Summary  [name] [name] [id number] DOB: 09-06-1941 DOA: 10/08/2019  PCP: Colon Branch, MD  Admit date: 10/08/2019 Discharge date: 10/20/2019  Admitted From: Home Disposition: Home  Recommendations for Outpatient Follow-up:  1. Follow up with PCP in 1-2 weeks 2. Please obtain BMP/CBC in one week 3. 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  Patient was admitted with dyspnea, orthopnea, PND and worsening bilateral pedal edema (acute CHF). As per prior documentation "patient reports about 10 months of progressive shortness of breath. He was diagnosed with stage IV angioimmunoblastic T-cell lymphoma in October 2020. He had an echocardiogram on 09/12/2019 which showed an EF of 60-65% with grade 1 diastolic dysfunction. He completed cycle 1 of chemotherapy with doxorubicin, Cytoxan, Adcetris on 09/29/2019. Since starting chemotherapy he has developed chemotherapy associated pancytopenia. In the ED, patient required 2 to 3 L oxygen via nasal cannula to maintain O2 sat. Labs notable for pancytopenia,  high-sensitivity troponin I 869, BNP 1106. Portable chest x-ray showed right Port-A-Cath in place and patchy interstitial and airspace opacities throughout both lungs with bilateral effusions. EKG showed sinus tachycardia, first-degree AV block, T wave flattening throughout, and low voltage. Hospitalist service was consulted to admit for further evaluation and management".  Discharge Diagnoses:  Principal Problem:    Acute respiratory failure with hypoxia (HCC) Active Problems:   Hyperlipidemia associated with type 2 diabetes mellitus ([place])   Hypertension associated with diabetes ([place])   Diabetes mellitus, type II ([place])   Angioimmunoblastic lymphoma ([place])   Acute CHF (congestive heart failure) (HCC)   Elevated troponin   Antineoplastic chemotherapy induced pancytopenia ([place])  1. Acute respiratory failure secondary to acute systolic CHF.  Ejection fraction of 45 to 50% on echocardiogram.  Seen by cardiology and was diuresed with intravenous Lasix.  He developed acute kidney injury.  It was recommended that patient's Lasix be given as needed.  He is on Coreg and losartan.  Currently renal function is stable.  He does not require home oxygen and is able to ambulate comfortably on room air. 2. Elevated troponin.  Felt to be related to demand ischemia.  Cardiology following and will pursue further work-up as an outpatient. 3. Chronic diarrhea.  C. difficile and GI pathogen panel negative.  Felt to be related to chemotherapy.  Continue Imodium as needed. 4. Sepsis secondary to HCAP.  Patient was noted to have a febrile temp spike to 1-2.4 on 12/23.  He was tachypneic.  Procalcitonin was mildly elevated.  Covid test was negative.  CTA chest showed no pulmonary embolus, but did show groundglass opacities within the left upper and lower lobe concerning for COVID-19 pneumonia, edema related to fluid overload right moderate to large pleural effusion.  Patient was treated with intravenous cefepime.  Overall respiratory status has improved.  He is not febrile.  He has been transitioned to oral antibiotics.  Discharge Instructions  Discharge Instructions    Diet - low sodium heart healthy   Complete by: As directed    Increase activity slowly   Complete by: As directed      Allergies as of 10/20/2019      Reactions  Doxycycline Rash      Medication List    STOP taking these medications   chlorthalidone 25 MG  tablet Commonly known as: HYGROTON   ketoconazole 2 % cream Commonly known as: NIZORAL   omeprazole 20 MG capsule Commonly known as: PriLOSEC     TAKE these medications   acetaminophen 500 MG tablet Commonly known as: TYLENOL Take 500 mg by mouth every 6 (six) hours as needed for moderate pain.   Alka-Seltzer 325 MG Tbef tablet Generic drug: aspirin-sod bicarb-citric acid Take 325 mg by mouth every 6 (six) hours as needed (Heart Burn).   allopurinol 300 MG tablet Commonly known as: ZYLOPRIM Take 1 tablet (300 mg total) by mouth daily.   amoxicillin-clavulanate 875-125 MG tablet Commonly known as: Augmentin Take 1 tablet by mouth 2 (two) times daily for 2 days.   aspirin 81 MG tablet Take 81 mg by mouth daily.   carvedilol 12.5 MG tablet Commonly known as: COREG Take 1 tablet (12.5 mg total) by mouth 2 (two) times daily with a meal.   furosemide 40 MG tablet Commonly known as: Lasix Take 1 tablet (40 mg total) by mouth daily as needed for fluid or edema.   Glucerna 1.0 Cal/CarbSteady Liqd Take 1 Can by mouth daily. Okay to drink up to 3 cans/bottles daily to replace meals as needed.   HYDROcodone-acetaminophen 7.5-325 MG tablet Commonly known as: NORCO Take 1 tablet by mouth 3 (three) times daily as needed for moderate pain.   LORazepam 0.5 MG tablet Commonly known as: Ativan Take 1 tablet (0.5 mg total) by mouth every 6 (six) hours as needed (Nausea or vomiting).   losartan 100 MG tablet Commonly known as: COZAAR Take 1 tablet (100 mg total) by mouth daily.   metFORMIN 500 MG tablet Commonly known as: GLUCOPHAGE Take 1 tablet (500 mg total) by mouth daily with breakfast.   ondansetron 8 MG tablet Commonly known as: Zofran Take 1 tablet (8 mg total) by mouth 2 (two) times daily as needed for refractory nausea / vomiting. Start on day 3 after cyclophosphamide chemotherapy.   potassium chloride 10 MEQ tablet Commonly known as: Klor-Con M10 Take 1 tablet (10  mEq total) by mouth daily.   prochlorperazine 10 MG tablet Commonly known as: COMPAZINE Take 1 tablet (10 mg total) by mouth every 6 (six) hours as needed (Nausea or vomiting).   protein supplement shake Liqd Commonly known as: PREMIER PROTEIN Take 2 oz by mouth daily.   simvastatin 80 MG tablet Commonly known as: ZOCOR Take 0.5 tablets (40 mg total) by mouth daily.      Follow-up Information    Colon Branch, MD. Schedule an appointment as soon as possible for a visit in 2 week(s).   Specialty: Internal Medicine Contact information: [address] [place] [address] [phone]        [name], [name], MD. Schedule an appointment as soon as possible for a visit in 2 week(s).   Specialty: Cardiology Contact information: [place] [address] [phone]        [facility] Follow up.   Specialty: Home Health Services Why: Will call you to make appointment for Friday. Please feel free to call them.  Contact information: [address] [place] [address] [phone]          Allergies  Allergen Reactions  . Doxycycline Rash    Consultations:  Cardiology  Procedures/Studies: DG Chest 2 View  Result Date: 09/23/2019 CLINICAL DATA:  Productive cough and dyspnea for 4 weeks.  Lymphoma. EXAM: CHEST - 2 VIEW COMPARISON:  06/16/2019 chest radiograph. FINDINGS: Right internal jugular Port-A-Cath terminates in the middle third of the SVC. Stable cardiomediastinal silhouette with normal heart size. No pneumothorax. Small bilateral pleural effusions. No pulmonary edema. Mild bibasilar atelectasis. IMPRESSION: Mild bibasilar atelectasis with small bilateral pleural effusions. Electronically Signed   By: Ilona Sorrel M.D.   On: 09/23/2019 13:45   CT ANGIO CHEST PE W OR WO CONTRAST  Result Date: 10/15/2019 CLINICAL DATA:  PE suspected, high pretest probability. Elevated D-dimer and shortness of  breath. History of T-cell lymphoma, per earlier radiology reports. EXAM: CT ANGIOGRAPHY CHEST WITH CONTRAST TECHNIQUE: Multidetector CT imaging of the chest was performed using the standard protocol during bolus administration of intravenous contrast. Multiplanar CT image reconstructions and MIPs were obtained to evaluate the vascular anatomy. CONTRAST:  1100mL OMNIPAQUE IOHEXOL 350 MG/ML SOLN COMPARISON:  Chest CT angiogram dated 08/25/2019. FINDINGS: Cardiovascular: Study is limited due to patient breathing motion artifact and attenuation artifact. Given these limitations, there is no convincing pulmonary embolism identified. No thoracic aortic aneurysm or evidence of aortic dissection. Cardiomegaly. No pericardial effusion. Mediastinum/Nodes: Grossly stable subcarinal lymphadenopathy. Esophagus is unremarkable. Trachea is unremarkable. Lungs/Pleura: Ground-glass consolidations are present within the LEFT upper lobe, lingula and LEFT lower lobe. More confluent consolidation is present within the anterior aspects of the LEFT upper lobe. RIGHT pleural effusion, moderate to large in size, with adjacent atelectasis. Small LEFT pleural effusion. Upper Abdomen: Low-density material surrounding the pancreatic head and proximal pancreatic body, extending into the portacaval space, presumably patient's known abdominal lymphadenopathy, incompletely imaged at the lower portion of the study. Small amount of free fluid in the upper abdomen, as described on the earlier PET-CT report of 09/17/2019. w Musculoskeletal: No acute or suspicious osseous finding. Bilateral axillary lymphadenopathy, LEFT greater than RIGHT, as previously described. Review of the MIP images confirms the above findings. IMPRESSION: 1. No pulmonary embolism seen, but with fairly significant study limitations as detailed above. 2. New ground-glass opacities within the LEFT upper lobe, lingula and LEFT lower lobe. Differential includes atypical pneumonias  such as viral (including COVID-19 pneumonia) or fungal, interstitial pneumonias, edema related to volume overload/CHF, chronic interstitial diseases, hypersensitivity pneumonitis, and respiratory bronchiolitis. 3. More confluent consolidation within the anterior aspects of the LEFT upper lobe, also suspicious for pneumonia. 4. RIGHT pleural effusion, moderate to large in size, with adjacent atelectasis. 5. Bilateral axillary lymphadenopathy, LEFT greater than RIGHT, as previously described, compatible with previous radiology reports describing T-cell lymphoma/ischemia. Also grossly stable subcarinal and upper abdominal lymphadenopathy. 6. Small amount of free fluid in the upper abdomen, as described on the earlier PET-CT report of 09/17/2019. 7. Cardiomegaly. These results will be called to the ordering clinician or representative by the Radiologist Assistant, and communication documented in the PACS or zVision Dashboard. Electronically Signed   By: Franki Cabot M.D.   On: 10/15/2019 16:15   DG Chest Port 1 View  Result Date: 10/15/2019 CLINICAL DATA:  Shortness of breath EXAM: PORTABLE CHEST 1 VIEW COMPARISON:  10/08/2019 FINDINGS: Right Port-A-Cath remains in place, with the tip in the SVC. Cardiomegaly, vascular congestion. Improved aeration of the lungs. Minimal residual patchy opacities in both lungs. No visible significant effusions or acute bony abnormality. IMPRESSION: Improved aeration in the lungs. Minimal residual patchy bilateral airspace disease. No visible effusions. Cardiomegaly, vascular congestion. Electronically Signed   By: Lennette Bihari  [place] M.D.   On: 10/15/2019 09:25   DG Chest Port 1 View  Result Date: 10/08/2019 CLINICAL DATA:  Cough, shortness of breath, weakness EXAM: PORTABLE CHEST 1 VIEW COMPARISON:  Radiograph 09/23/2019 FINDINGS: Globally increased opacity in the right hemithorax with indistinctness of the right lung base likely reflecting some layering pleural fluid on a  background of more diffuse interstitial airspace opacities. Layering left pleural effusion is present as well. A right IJ approach Port-A-Cath tip terminates in the superior cavoatrial junction. Cardiomegaly is similar to priors. IMPRESSION: Patchy interstitial and airspace opacity throughout both lungs with layering bilateral effusions. Appearance favoring CHF/volume overload though infection could have a similar appearance. Electronically Signed   By: Lovena Le M.D.   On: 10/08/2019 22:45   Xray, abd (2 view)  Result Date: 10/02/2019 CLINICAL DATA:  Nausea, abdominal distension EXAM: ABDOMEN - 2 VIEW COMPARISON:  None. FINDINGS: Examination is limited secondary to poor penetration from patient body habitus. The bowel gas pattern is normal. There is no evidence of free air. No radio-opaque calculi or other significant radiographic abnormality is seen. IMPRESSION: Nonobstructive bowel gas pattern. Electronically Signed   By: Davina Poke M.D.   On: 10/02/2019 16:14   ECHOCARDIOGRAM COMPLETE  Result Date: 10/09/2019   ECHOCARDIOGRAM REPORT   Patient Name:   [name] [name] Date of Exam: 10/09/2019 Medical Rec #:  [id number]     Height:       72.0 in Accession #:    [id number]    Weight:       304.5 lb Date of Birth:  01-23-1941     BSA:          2.55 m Patient Age:    74 years      BP:           131/81 mmHg Patient Gender: M             HR:           85 bpm. Exam Location:  Inpatient Procedure: 2D Echo, Cardiac Doppler, Color Doppler and Intracardiac            Opacification Agent Indications:    Congestive Heart Failure 428.0  History:        Patient has prior history of Echocardiogram examinations, most                 recent 09/12/2019. CHF, Arrythmias:PVC; Risk                 Factors:Hypertension, Diabetes, Dyslipidemia and Former Smoker.                 Elevated troponin. GERD. Palpitations.  Sonographer:    Paulita Fujita RDCS Referring Phys: [id number] [place]  1. Left  ventricular ejection fraction, by visual estimation, is 45 to 50%. The left ventricle has mildly decreased function. There is no left ventricular hypertrophy.  2. Mild hypokinesis of the left ventricular, mid-apical anterior wall and apical segment.  3. Definity contrast agent was given IV to delineate the left ventricular endocardial borders.  4. Left ventricular diastolic parameters are consistent with Grade I diastolic dysfunction (impaired relaxation).  5. Mild to moderately dilated left ventricular internal cavity size.  6. The left ventricle demonstrates regional wall motion abnormalities.  7. Global right ventricle has low normal systolic function.The right ventricular size is normal. No increase in right ventricular wall thickness.  8. Left atrial size was normal.  9. Right atrial size was normal. 10.  The mitral valve is grossly normal. Trivial mitral valve regurgitation. 11. The tricuspid valve is grossly normal. Tricuspid valve regurgitation is mild. 12. The aortic valve is tricuspid. Aortic valve regurgitation is trivial. Mild aortic valve sclerosis without stenosis. 13. The pulmonic valve was grossly normal. Pulmonic valve regurgitation is not visualized. 14. Aortic dilatation noted. 15. 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 16. Mildly elevated pulmonary artery systolic pressure. 17. The inferior vena cava is dilated in size with >50% respiratory variability, suggesting right atrial pressure of 8 mmHg. FINDINGS  Left Ventricle: Left ventricular ejection fraction, by visual estimation, is 45 to 50%. The left ventricle has mildly decreased function. Mild hypokinesis of the left ventricular, mid-apical anterior wall and apical segment. Definity contrast agent was given IV to delineate the left ventricular endocardial borders. The left ventricle demonstrates regional wall motion abnormalities. The left ventricular internal cavity size was mildly to moderately dilated left ventricle.  There is no left ventricular hypertrophy. Left ventricular diastolic parameters are consistent with Grade I diastolic dysfunction (impaired relaxation). Indeterminate filling pressures. Right Ventricle: The right ventricular size is normal. No increase in right ventricular wall thickness. Global RV systolic function is has low normal systolic function. The tricuspid regurgitant velocity is 2.62 m/s, and with an assumed right atrial pressure of 8 mmHg, the estimated right ventricular systolic pressure is mildly elevated at 35.5 mmHg. Left Atrium: Left atrial size was normal in size. Right Atrium: Right atrial size was normal in size Pericardium: There is no evidence of pericardial effusion. Mitral Valve: The mitral valve is grossly normal. Trivial mitral valve regurgitation. Tricuspid Valve: The tricuspid valve is grossly normal. Tricuspid valve regurgitation is mild. Aortic Valve: The aortic valve is tricuspid. Aortic valve regurgitation is trivial. Mild aortic valve sclerosis is present, with no evidence of aortic valve stenosis. Pulmonic Valve: The pulmonic valve was grossly normal. Pulmonic valve regurgitation is not visualized. Pulmonic regurgitation is not visualized. Aorta: Aortic dilatation noted. 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 Venous: The inferior vena cava is dilated in size with greater than 50% respiratory variability, suggesting right atrial pressure of 8 mmHg. IAS/Shunts: No atrial level shunt detected by color flow Doppler.  LEFT VENTRICLE PLAX 2D LVIDd:         6.30 cm       Diastology LVIDs:         4.20 cm       LV e' lateral:   10.30 cm/s LV PW:         1.00 cm       LV E/e' lateral: 6.3 LV IVS:        1.00 cm       LV e' medial:    7.18 cm/s LVOT diam:     2.20 cm       LV E/e' medial:  9.1 LV SV:         123 ml LV SV Index:   45.27 LVOT Area:     3.80 cm  LV Volumes (MOD) LV area d, A2C:    50.30 cm LV area d, A4C:    50.10 cm LV area s, A2C:    33.40 cm LV area  s, A4C:    30.20 cm LV major d, A2C:   10.20 cm LV major d, A4C:   9.67 cm LV major s, A2C:   8.58 cm LV major s, A4C:   7.69 cm LV vol d, MOD A2C: 208.0 ml LV  vol d, MOD A4C: 214.0 ml LV vol s, MOD A2C: 111.0 ml LV vol s, MOD A4C: 98.8 ml LV SV MOD A2C:     97.0 ml LV SV MOD A4C:     214.0 ml LV SV MOD BP:      106.7 ml RIGHT VENTRICLE RV S prime:     12.90 cm/s TAPSE (M-mode): 2.2 cm LEFT ATRIUM             Index       RIGHT ATRIUM           Index LA diam:        3.70 cm 1.45 cm/m  RA Area:     15.60 cm LA Vol (A2C):   77.8 ml 30.54 ml/m RA Volume:   35.20 ml  13.82 ml/m LA Vol (A4C):   71.1 ml 27.91 ml/m LA Biplane Vol: 76.7 ml 30.11 ml/m  AORTIC VALVE LVOT Vmax:   131.00 cm/s LVOT Vmean:  91.400 cm/s LVOT VTI:    0.238 m  AORTA Ao Root diam: 4.00 cm MITRAL VALVE                        TRICUSPID VALVE MV Area (PHT): 3.42 cm             TR Peak grad:   27.5 mmHg MV PHT:        64.38 msec           TR Vmax:        262.00 cm/s MV Decel Time: 222 msec MV E velocity: 65.10 cm/s 103 cm/s  SHUNTS MV A velocity: 50.60 cm/s 70.3 cm/s Systemic VTI:  0.24 m MV E/A ratio:  1.29       1.5       Systemic Diam: 2.20 cm  Lyman Bishop MD Electronically signed by Lyman Bishop MD Signature Date/Time: 10/09/2019/4:09:07 PM    Final        Subjective: Feeling better.  Denies any shortness of breath.  Able to ambulate on room air without difficulty.  Discharge Exam: Vitals:   10/20/19 0538 10/20/19 0957 10/20/19 1249 10/20/19 1456  BP: (!) 180/73  (!) 159/76   Pulse: 65  (!) 57 76  Resp: 20  (!) 24   Temp: 99 F (37.2 C)  97.7 F (36.5 C)   TempSrc:   Oral   SpO2: 92% 93% 97% 96%  Weight:      Height:        General: Pt is alert, awake, not in acute distress Cardiovascular: RRR, S1/S2 +, no rubs, no gallops Respiratory: CTA bilaterally, no wheezing, no rhonchi Abdominal: Soft, NT, ND, bowel sounds + Extremities: no edema, no cyanosis    The results of significant diagnostics from this  hospitalization (including imaging, microbiology, ancillary and laboratory) are listed below for reference.     Microbiology: Recent Results (from the past 240 hour(s))  Respiratory Panel by RT PCR (Flu A&B, Covid) - Nasopharyngeal Swab     Status: None   Collection Time: 10/15/19 12:05 PM   Specimen: Nasopharyngeal Swab  Result Value Ref Range Status   SARS Coronavirus 2 by RT PCR NEGATIVE NEGATIVE Final    Comment: (NOTE) SARS-CoV-2 target nucleic acids are NOT DETECTED. The SARS-CoV-2 RNA is generally detectable in upper respiratoy specimens during the acute phase of infection. The lowest concentration of SARS-CoV-2 viral copies this assay can detect is 131 copies/mL. A negative result does not preclude SARS-Cov-2  infection and should not be used as the sole basis for treatment or other patient management decisions. A negative result may occur with  improper specimen collection/handling, submission of specimen other than nasopharyngeal swab, presence of viral mutation(s) within the areas targeted by this assay, and inadequate number of viral copies (<131 copies/mL). A negative result must be combined with clinical observations, patient history, and epidemiological information. The expected result is Negative. Fact Sheet for Patients:  PinkCheek.be Fact Sheet for Healthcare Providers:  GravelBags.it This test is not yet ap proved or cleared by the [place] FDA and  has been authorized for detection and/or diagnosis of SARS-CoV-2 by FDA under an Emergency Use Authorization (EUA). This EUA will remain  in effect (meaning this test can be used) for the duration of the COVID-19 declaration under Section 564(b)(1) of the Act, 21 U.S.C. section 360bbb-3(b)(1), unless the authorization is terminated or revoked sooner.    Influenza A by PCR NEGATIVE NEGATIVE Final   Influenza B by PCR NEGATIVE NEGATIVE Final    Comment:  (NOTE) The Xpert Xpress SARS-CoV-2/FLU/RSV assay is intended as an aid in  the diagnosis of influenza from Nasopharyngeal swab specimens and  should not be used as a sole basis for treatment. Nasal washings and  aspirates are unacceptable for Xpert Xpress SARS-CoV-2/FLU/RSV  testing. Fact Sheet for Patients: PinkCheek.be Fact Sheet for Healthcare Providers: GravelBags.it This test is not yet approved or cleared by the [place] FDA and  has been authorized for detection and/or diagnosis of SARS-CoV-2 by  FDA under an Emergency Use Authorization (EUA). This EUA will remain  in effect (meaning this test can be used) for the duration of the  Covid-19 declaration under Section 564(b)(1) of the Act, 21  U.S.C. section 360bbb-3(b)(1), unless the authorization is  terminated or revoked. Performed at [facility], [place] [address], [place], [place] [address]   Culture, blood (routine x 2)     Status: None   Collection Time: 10/15/19 12:48 PM   Specimen: BLOOD  Result Value Ref Range Status   Specimen Description   Final    BLOOD RIGHT ARM Performed at [facility] [address] [place], [place] [address]    Special Requests   Final    BOTTLES DRAWN AEROBIC AND ANAEROBIC Blood Culture adequate volume Performed at [facility] [address], [place], [place] [address]    Culture   Final    NO GROWTH 5 DAYS Performed at [facility], [place] [address] [place], [place] [address]    Report Status 10/20/2019 FINAL  Final  Culture, blood (routine x 2)     Status: Abnormal   Collection Time: 10/15/19 12:51 PM   Specimen: BLOOD LEFT HAND  Result Value Ref Range Status   Specimen Description   Final    BLOOD LEFT HAND Performed at [facility] [address], [place], [place] [address]    Special Requests   Final    BOTTLES DRAWN AEROBIC AND  ANAEROBIC Blood Culture adequate volume Performed at [facility] [address], [place], [place] [address]    Culture  Setup Time   Final    ANAEROBIC BOTTLE ONLY GRAM POSITIVE COCCI CRITICAL RESULT CALLED TO, READ BACK BY AND VERIFIED WITH: B GREEN PHARMD 10/16/19 0127 JDW    Culture (A)  Final    STAPHYLOCOCCUS SPECIES (COAGULASE NEGATIVE) THE SIGNIFICANCE OF ISOLATING THIS ORGANISM FROM A SINGLE SET OF BLOOD  CULTURES WHEN MULTIPLE SETS ARE DRAWN IS UNCERTAIN. PLEASE NOTIFY THE MICROBIOLOGY DEPARTMENT WITHIN ONE WEEK IF SPECIATION AND SENSITIVITIES ARE REQUIRED. Performed at [facility], [place] [address], [place], [place] [address]    Report Status 10/19/2019 FINAL  Final  Novel Coronavirus, NAA (Hosp order, Send-out to Ref Lab; TAT 18-24 hrs     Status: None   Collection Time: 10/16/19 10:46 AM   Specimen: Nasopharyngeal Swab; Respiratory  Result Value Ref Range Status   SARS-CoV-2, NAA NOT DETECTED NOT DETECTED Final    Comment: (NOTE) This nucleic acid amplification test was developed and its performance characteristics determined by [employer]. Nucleic acid amplification tests include PCR and TMA. This test has not been FDA cleared or approved. This test has been authorized by FDA under an Emergency Use Authorization (EUA). This test is only authorized for the duration of time the declaration that circumstances exist justifying the authorization of the emergency use of in vitro diagnostic tests for detection of SARS-CoV-2 virus and/or diagnosis of COVID-19 infection under section 564(b)(1) of the Act, 21 U.S.C. [id number]) (1), unless the authorization is terminated or revoked sooner. When diagnostic testing is negative, the possibility of a false negative result should be considered in the context of a patient's recent exposures and the presence of clinical signs and symptoms consistent with COVID-19. An individual without symptoms of  COVID- 19 and who is not shedding SARS-CoV-2 vi rus would expect to have a negative (not detected) result in this assay. Performed At: [facility] [place], [place] [id number] Rush Farmer MD [id number]    [place]  Final    Comment: Performed at [facility] [address] [place], [place] [address]     Labs: BNP (last 3 results) Recent Labs    10/08/19 2258 10/13/19 1016 10/16/19 0322  BNP 1,106.6* 831.8* [id number]*   Basic Metabolic Panel: Recent Labs  Lab 10/16/19 0322 10/17/19 0402 10/18/19 0348 10/19/19 0417 10/20/19 0438  NA 133* 132* 135 134* 136  K 3.1* 3.7 3.3* 4.3 3.5  CL 101 101 101 98 104  CO2 21* 16* 22 22 22   GLUCOSE 107* 92 105* 114* 113*  BUN 12 10 9 10 10   CREATININE 1.11 0.84 0.82 0.84 0.73  CALCIUM 7.1* 7.2* 7.5* 7.0* 7.7*   Liver Function Tests: Recent Labs  Lab 10/16/19 0322 10/17/19 0402 10/18/19 0348 10/19/19 0417 10/20/19 0438  AST 17 20 15 17 30   ALT 15 13 13 13 20   ALKPHOS 58 62 67 64 76  BILITOT 0.6 0.6 0.5 0.8 0.9  PROT 5.8* 6.2* 6.1* 6.4* 6.5  ALBUMIN 2.6* 2.6* 2.5* 2.6* 2.7*   No results for input(s): LIPASE, AMYLASE in the last 168 hours. No results for input(s): AMMONIA in the last 168 hours. CBC: Recent Labs  Lab 10/16/19 0322 10/17/19 0402 10/18/19 0348 10/19/19 0417 10/20/19 0438  WBC 6.9 9.2 7.4 8.2 8.2  NEUTROABS 3.8 6.1 4.7 5.6 5.4  HGB 8.4* 8.6* 9.0* 8.6* 9.3*  HCT 27.2* 27.8* 28.6* 26.8* 29.1*  MCV 86.9 87.1 86.9 86.2 86.4  PLT 416* 435* 408* 449* 432*   Cardiac Enzymes: No results for input(s): CKTOTAL, CKMB, CKMBINDEX, TROPONINI in the last 168 hours. BNP: Invalid input(s): POCBNP CBG: Recent Labs  Lab 10/19/19 1221 10/19/19 1552 10/19/19 2129 10/20/19 0814 10/20/19 1129  GLUCAP 128* 126* 104* 107* 150*   D-Dimer No results for input(s): DDIMER in the last 72 hours. Hgb A1c No results for  input(s): HGBA1C in the last 72  hours. Lipid Profile No results for input(s): CHOL, HDL, LDLCALC, TRIG, CHOLHDL, LDLDIRECT in the last 72 hours. Thyroid function studies No results for input(s): TSH, T4TOTAL, T3FREE, THYROIDAB in the last 72 hours.  Invalid input(s): FREET3 Anemia work up No results for input(s): VITAMINB12, FOLATE, FERRITIN, TIBC, IRON, RETICCTPCT in the last 72 hours. Urinalysis    Component Value Date/Time   COLORURINE YELLOW 10/09/2019 0406   APPEARANCEUR CLEAR 10/09/2019 0406   LABSPEC 1.008 10/09/2019 0406   PHURINE 5.0 10/09/2019 0406   GLUCOSEU NEGATIVE 10/09/2019 0406   HGBUR SMALL (A) 10/09/2019 0406   HGBUR negative 12/07/2010 0904   BILIRUBINUR NEGATIVE 10/09/2019 0406   KETONESUR NEGATIVE 10/09/2019 0406   PROTEINUR 30 (A) 10/09/2019 0406   UROBILINOGEN 0.2 09/04/2012 0950   NITRITE NEGATIVE 10/09/2019 0406   LEUKOCYTESUR NEGATIVE 10/09/2019 0406   Sepsis Labs Invalid input(s): PROCALCITONIN,  WBC,  LACTICIDVEN Microbiology Recent Results (from the past 240 hour(s))  Respiratory Panel by RT PCR (Flu A&B, Covid) - Nasopharyngeal Swab     Status: None   Collection Time: 10/15/19 12:05 PM   Specimen: Nasopharyngeal Swab  Result Value Ref Range Status   SARS Coronavirus 2 by RT PCR NEGATIVE NEGATIVE Final    Comment: (NOTE) SARS-CoV-2 target nucleic acids are NOT DETECTED. The SARS-CoV-2 RNA is generally detectable in upper respiratoy specimens during the acute phase of infection. The lowest concentration of SARS-CoV-2 viral copies this assay can detect is 131 copies/mL. A negative result does not preclude SARS-Cov-2 infection and should not be used as the sole basis for treatment or other patient management decisions. A negative result may occur with  improper specimen collection/handling, submission of specimen other than nasopharyngeal swab, presence of viral mutation(s) within the areas targeted by this assay, and inadequate number of viral copies (<131 copies/mL). A  negative result must be combined with clinical observations, patient history, and epidemiological information. The expected result is Negative. Fact Sheet for Patients:  PinkCheek.be Fact Sheet for Healthcare Providers:  GravelBags.it This test is not yet ap proved or cleared by the [place] FDA and  has been authorized for detection and/or diagnosis of SARS-CoV-2 by FDA under an Emergency Use Authorization (EUA). This EUA will remain  in effect (meaning this test can be used) for the duration of the COVID-19 declaration under Section 564(b)(1) of the Act, 21 U.S.C. section 360bbb-3(b)(1), unless the authorization is terminated or revoked sooner.    Influenza A by PCR NEGATIVE NEGATIVE Final   Influenza B by PCR NEGATIVE NEGATIVE Final    Comment: (NOTE) The Xpert Xpress SARS-CoV-2/FLU/RSV assay is intended as an aid in  the diagnosis of influenza from Nasopharyngeal swab specimens and  should not be used as a sole basis for treatment. Nasal washings and  aspirates are unacceptable for Xpert Xpress SARS-CoV-2/FLU/RSV  testing. Fact Sheet for Patients: PinkCheek.be Fact Sheet for Healthcare Providers: GravelBags.it This test is not yet approved or cleared by the [place] FDA and  has been authorized for detection and/or diagnosis of SARS-CoV-2 by  FDA under an Emergency Use Authorization (EUA). This EUA will remain  in effect (meaning this test can be used) for the duration of the  Covid-19 declaration under Section 564(b)(1) of the Act, 21  U.S.C. section 360bbb-3(b)(1), unless the authorization is  terminated or revoked. Performed at [facility], [place] [address] [place], [place] [address]   Culture, blood (routine x 2)     Status:  None   Collection Time: 10/15/19 12:48 PM   Specimen: BLOOD  Result Value Ref Range Status   Specimen  Description   Final    BLOOD RIGHT ARM Performed at [facility] [address], [place], [place] [address]    Special Requests   Final    BOTTLES DRAWN AEROBIC AND ANAEROBIC Blood Culture adequate volume Performed at [facility] [address] [place], [place] [address]    Culture   Final    NO GROWTH 5 DAYS Performed at [facility], [place] [address] [place], [place] [address]    Report Status 10/20/2019 FINAL  Final  Culture, blood (routine x 2)     Status: Abnormal   Collection Time: 10/15/19 12:51 PM   Specimen: BLOOD LEFT HAND  Result Value Ref Range Status   Specimen Description   Final    BLOOD LEFT HAND Performed at [facility] [address], [place], [place] [address]    Special Requests   Final    BOTTLES DRAWN AEROBIC AND ANAEROBIC Blood Culture adequate volume Performed at [facility] [address], [place], [place] [address]    Culture  Setup Time   Final    ANAEROBIC BOTTLE ONLY GRAM POSITIVE COCCI CRITICAL RESULT CALLED TO, READ BACK BY AND VERIFIED WITH: B GREEN PHARMD 10/16/19 0127 JDW    Culture (A)  Final    STAPHYLOCOCCUS SPECIES (COAGULASE NEGATIVE) THE SIGNIFICANCE OF ISOLATING THIS ORGANISM FROM A SINGLE SET OF BLOOD CULTURES WHEN MULTIPLE SETS ARE DRAWN IS UNCERTAIN. PLEASE NOTIFY THE MICROBIOLOGY DEPARTMENT WITHIN ONE WEEK IF SPECIATION AND SENSITIVITIES ARE REQUIRED. Performed at [facility], [place] [address] [place], [place] [address]    Report Status 10/19/2019 FINAL  Final  Novel Coronavirus, NAA (Hosp order, Send-out to Ref Lab; TAT 18-24 hrs     Status: None   Collection Time: 10/16/19 10:46 AM   Specimen: Nasopharyngeal Swab; Respiratory  Result Value Ref Range Status   SARS-CoV-2, NAA NOT DETECTED NOT DETECTED Final    Comment: (NOTE) This nucleic acid amplification test was developed and its performance characteristics determined by [employer]. Nucleic acid amplification tests include PCR and TMA. This test has not been FDA cleared or approved. This test has been authorized by FDA under an Emergency Use Authorization (EUA). This test is only authorized for the duration of time the declaration that circumstances exist justifying the authorization of the emergency use of in vitro diagnostic tests for detection of SARS-CoV-2 virus and/or diagnosis of COVID-19 infection under section 564(b)(1) of the Act, 21 U.S.C. [id number]) (1), unless the authorization is terminated or revoked sooner. When diagnostic testing is negative, the possibility of a false negative result should be considered in the context of a patient's recent exposures and the presence of clinical signs and symptoms consistent with COVID-19. An individual without symptoms of COVID- 19 and who is not shedding SARS-CoV-2 vi rus would expect to have a negative (not detected) result in this assay. Performed At: [facility] [place], [place] [id number] Rush Farmer MD [id number]    [place]  Final    Comment: Performed at [facility] [address], [place], [place] [address]     Time coordinating discharge: 20mins  SIGNED:   Kathie Dike, MD  [facility] 10/20/2019, 9:48 PM   If 7PM-7AM, please contact night-coverage [email or web address]

## 2019-10-20 NOTE — Progress Notes (Signed)
Per CCMD, patient appeared to have peaked P waves.  EKG obtained.  MD notified.  SB on EKG - received order to discontinue cardiac monitoring.  Patient's RN notified and at bedside with MD.

## 2019-10-20 NOTE — Progress Notes (Signed)
Nutrition Follow-up  DOCUMENTATION CODES:   Obesity unspecified  INTERVENTION:  - will d/c Ensure Enlive. - will order Magic Cup BID with meals, each supplement provides 290 kcal and 9 grams of protein. - will order Boost Breeze BID, each supplement provides 250 kcal and 9 grams of protein. - continue to encourage PO intakes.    NUTRITION DIAGNOSIS:   Increased nutrient needs related to acute illness, chronic illness, cancer and cancer related treatments as evidenced by estimated needs. -ongoing  GOAL:   Patient will meet greater than or equal to 90% of their needs -unmet  MONITOR:   PO intake, Supplement acceptance, Labs, Weight trends  ASSESSMENT:   78 y.o. 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 He presented to the ED for evaluation of dyspnea and non-productive cough. He reported progressive SOB x10 months and that he was diagnosed with cancer in October. He reported BLE edema/swelling which is chronic. Patient stated he has increased urinary frequency without dysuria.  Patient has refused all but the first 2 bottles of Ensure offered since order placed on 12/21; will d/c per patient preference and will trial other supplements outlined above. Weight trending down from admission on 12/28 until 12/24 and has been fairly stable since 12/24. 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 Medications reviewed; sliding scale novolog, 2 mg imodium PRN, daily multivitamin with minerals, 250 mg florastor BID.    Diet Order:   Diet Order            Diet heart healthy/carb modified Room service appropriate? Yes; Fluid consistency: Thin; Fluid restriction: 1500 mL Fluid  Diet effective now              EDUCATION NEEDS:   No education needs have been identified at this time  Skin:  Skin Assessment: Reviewed RN Assessment  Last BM:  12/21  Height:   Ht Readings from Last 1 Encounters:  10/10/19 6' (1.829 m)    Weight:   Wt Readings from Last 1 Encounters:  10/20/19 120.1 kg    Ideal Body Weight:  80.9 kg  BMI:  Body mass index is 35.91 kg/m.  Estimated Nutritional Needs:   Kcal:  2100-2300 kcal  Protein:  110-120 grams  Fluid:  >/= 2.1 L/day     Jarome Matin, MS, RD, LDN, [facility] Inpatient Clinical Dietitian Pager # [phone] After hours/weekend pager # [phone]

## 2019-10-20 NOTE — Progress Notes (Signed)
Pt walked into bathroom, then walked down the [name] [name] passed 2 patient room doors and back to his room.  Pt had some shortness of breath, room air O2 sat was 96%.

## 2019-10-20 NOTE — Plan of Care (Signed)
  Problem: Health Behavior/Discharge Planning: Goal: Ability to manage health-related needs will improve Outcome: Adequate for Discharge   Problem: Clinical Measurements: Goal: Ability to maintain clinical measurements within normal limits will improve Outcome: Adequate for Discharge Goal: Will remain free from infection Outcome: Adequate for Discharge Goal: Diagnostic test results will improve Outcome: Adequate for Discharge   Problem: Activity: Goal: Risk for activity intolerance will decrease Outcome: Adequate for Discharge   Problem: Nutrition: Goal: Adequate nutrition will be maintained Outcome: Adequate for Discharge   Problem: Elimination: Goal: Will not experience complications related to bowel motility Outcome: Adequate for Discharge   Problem: Acute Rehab PT Goals(only PT should resolve) Goal: Pt Will Go Supine/Side To Sit Outcome: Adequate for Discharge Goal: Patient Will Transfer Sit To/From Stand Outcome: Adequate for Discharge Goal: Pt Will Ambulate Outcome: Adequate for Discharge Goal: Pt Will Go Up/Down Stairs Outcome: Adequate for Discharge Goal: Pt/caregiver will Perform Home Exercise Program Outcome: Adequate for Discharge   Problem: Acute Rehab OT Goals (only OT should resolve) Goal: Pt. Will Perform Grooming Outcome: Adequate for Discharge Goal: Pt. Will Perform Lower Body Dressing Outcome: Adequate for Discharge Goal: Pt. Will Transfer To Toilet Outcome: Adequate for Discharge Goal: OT Additional ADL Goal #1 Outcome: Adequate for Discharge   Problem: Education: Goal: Ability to demonstrate management of disease process will improve Outcome: Adequate for Discharge Goal: Ability to verbalize understanding of medication therapies will improve Outcome: Adequate for Discharge   Problem: Activity: Goal: Capacity to carry out activities will improve Outcome: Adequate for Discharge   Problem: Cardiac: Goal: Ability to achieve and maintain  adequate cardiopulmonary perfusion will improve Outcome: Adequate for Discharge   Problem: Increased Nutrient Needs (NI-5.1) Goal: Food and/or nutrient delivery Description: Individualized approach for food/nutrient provision. Outcome: Adequate for Discharge Home with wife and sister today.

## 2019-10-20 NOTE — Progress Notes (Signed)
Pharmacy Antibiotic Note  [name] [name] is a 78 y.o. 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 Patient was febrile on 12/23. Pharmacy has been consulted for cefepime.  Day #6 of abx for suspected HAP. Afebrile, WBC wnl.  Plan: Continue cefepime 2 g IV q8h Monitor clinical picture, renal function F/U LOT   Height: 6' (182.9 cm) Weight: 264 lb 12.4 oz (120.1 kg) IBW/kg (Calculated) : 77.6  Temp (24hrs), Avg:98.6 F (37 C), Min:97.8 F (36.6 C), Max:99 F (37.2 C)  Recent Labs  Lab 10/15/19 1248 10/15/19 1444 10/16/19 0322 10/17/19 0402 10/18/19 0348 10/19/19 0417 10/20/19 0438  WBC  --   --  6.9 9.2 7.4 8.2 8.2  CREATININE 1.00  --  1.11 0.84 0.82 0.84 0.73  LATICACIDVEN 1.5 1.0  --   --   --   --   --     Estimated Creatinine Clearance: 103.5 mL/min (by C-G formula based on SCr of 0.73 mg/dL).    Allergies  Allergen Reactions  . Doxycycline Rash    Antimicrobials this admission:  12/23 vanc>> 12/27 12/23 cefepime>>  Dose adjustment: 12/25: vanc 1000 mg q12h --> 1500 mg IV q24h  Microbiology results:  12/23 BCx: 1/4 staph species 12/18 GI pct: neg 12/17 cdiff pcr: neg 12/17 ucx: insignif growth FINAL  Elenor Quinones, PharmD, BCPS, BCIDP Clinical Pharmacist 10/20/2019 11:19 AM

## 2019-10-20 NOTE — Progress Notes (Signed)
Physical Therapy Treatment Patient Details Name: [name] [name] MRN: [id number] DOB: 28-Jun-1941 Today's Date: 10/20/2019    History of Present Illness 78 yo male admitted to ED on 12/16 with progress weakness, diarrhea, syncopal episode, and respiratory failure. 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 PMH includes OA with history of bilateral TKR and R shoulder arthroplasty, DM, GERD, HTN, lumbar fusion, myasthenia gravis L eye.    PT Comments    Pt is hopeful to d/c home today. Reports he has been up amb, agreeable to amb inroom with PT. Continue to recommend HHPT at d/c. Pt has all necessary DME  Follow Up Recommendations  Supervision for mobility/OOB;Home health PT     Equipment Recommendations  None recommended by PT    Recommendations for Other Services       Precautions / Restrictions Precautions Precautions: Fall Restrictions Weight Bearing Restrictions: No    Mobility  Bed Mobility   Bed Mobility: Supine to Sit Rolling: Supervision   Supine to sit: Supervision     General bed mobility comments: incr time  Transfers Overall transfer level: Needs assistance Equipment used: Rolling walker (2 wheeled) Transfers: Sit to/from Stand Sit to Stand: Supervision         General transfer comment: for safety, incr time   Ambulation/Gait Ambulation/Gait assistance: Supervision;Min guard Gait Distance (Feet): 25 Feet Assistive device: None Gait Pattern/deviations: Step-through pattern;Decreased stride length Gait velocity: decr   General Gait Details: supervision for safety; SpO2=91% or greater on RA. attempting to furniture walk, discussed use of cane at home   Stairs             Wheelchair Mobility    Modified Rankin (Stroke Patients Only)       Balance                                            Cognition Arousal/Alertness: Awake/alert Behavior During Therapy: WFL for tasks assessed/performed Overall  Cognitive Status: Within Functional Limits for tasks assessed                                        Exercises      General Comments        Pertinent Vitals/Pain Pain Assessment: No/denies pain    Home Living                      Prior Function            PT Goals (current goals can now be found in the care plan section) Acute Rehab PT Goals Patient Stated Goal: go home  PT Goal Formulation: With patient Time For Goal Achievement: 10/24/19 Potential to Achieve Goals: Good Progress towards PT goals: Progressing toward goals    Frequency    Min 3X/week      PT Plan Current plan remains appropriate    Co-evaluation              AM-PAC PT "6 Clicks" Mobility   Outcome Measure  Help needed turning from your back to your side while in a flat bed without using bedrails?: None Help needed moving from lying on your back to sitting on the side of a flat bed without using bedrails?: None Help needed moving to and from  a bed to a chair (including a wheelchair)?: A Little Help needed standing up from a chair using your arms (e.g., wheelchair or bedside chair)?: A Little Help needed to walk in hospital room?: A Little Help needed climbing 3-5 steps with a railing? : A Little 6 Click Score: 20    End of Session Equipment Utilized During Treatment: Gait belt Activity Tolerance: Patient tolerated treatment well Patient left: in bed;with call bell/phone within reach;with bed alarm set;Other (comment)(EOB per pt request)   PT Visit Diagnosis: Difficulty in walking, not elsewhere classified (R26.2);Other abnormalities of gait and mobility (R26.89)     Time: 1450-1507 PT Time Calculation (min) (ACUTE ONLY): 17 min  Charges:  $Gait Training: 8-22 mins                     Baxter Flattery, PT   Acute Rehab Dept [facility]): [id number]   10/20/2019    [facility] 10/20/2019, 3:13 PM

## 2019-10-20 NOTE — TOC Progression Note (Signed)
Transition of Care [facility]) - Progression Note    Patient Details  Name: EMANUELLE FOLWELL MRN: [id number] Date of Birth: 11-Mar-1941  [facility] of [facility]) CM/SW Contact  Purcell Mouton, RN Phone Number: 10/20/2019, 4:28 PM  Clinical Narrative:    [facility] will follow pt at home for HHPT/OT start of care Friday.    Expected Discharge Plan: Home/Self Care Barriers to Discharge: Continued Medical Work up  Expected Discharge Plan and Services Expected Discharge Plan: Home/Self Care   Discharge Planning Services: CM Consult   Living arrangements for the past 2 months: Single Family Home Expected Discharge Date: 10/20/19                                     Social Determinants of Health (SDOH) Interventions    Readmission Risk Interventions No flowsheet data found.

## 2019-10-20 NOTE — Care Management Important Message (Signed)
Important Message  Patient Details IM Letter given to Gabriel Earing RN Case Manager to present to the Patient Name: [name] [name] MRN: [id number] Date of Birth: 1941-10-12   Medicare Important Message Given:  Yes     Kerin Salen 10/20/2019, 11:53 AM

## 2019-10-21 ENCOUNTER — Other Ambulatory Visit: Payer: Self-pay | Admitting: Oncology

## 2019-10-21 ENCOUNTER — Telehealth: Payer: Self-pay | Admitting: *Deleted

## 2019-10-21 ENCOUNTER — Other Ambulatory Visit: Payer: Self-pay

## 2019-10-21 DIAGNOSIS — I5021 Acute systolic (congestive) heart failure: Secondary | ICD-10-CM

## 2019-10-21 LAB — CALCIUM, IONIZED: Calcium, Ionized, Serum: 4.5 mg/dL (ref 4.5–5.6)

## 2019-10-21 NOTE — Telephone Encounter (Signed)
Transition Care Management Follow-up Telephone Call   Date discharged? 10/20/19   How have you been since you were released from the hospital? "Doing much better"   Do you understand why you were in the hospital? yes   Do you understand the discharge instructions? yes   Where were you discharged to? Home w wife.   Items Reviewed:  Medications reviewed: "They made some changes. I don't know right off and I'm laying down. I will bring the papers with me"  Allergies reviewed: " no new allergies"  Dietary changes reviewed: "They have me on a low salt diet"  Referrals reviewed: yes   Functional Questionnaire:   Activities of Daily Living (ADLs):   He states they are independent in the following: ambulation, bathing and hygiene, feeding, continence, grooming, toileting and dressing States they require assistance with the following: na   Any transportation issues/concerns?: no   Any patient concerns? no   Confirmed importance and date/time of follow-up visits scheduled yes  Provider Appointment booked with PCP 10/28/19.  Confirmed with patient if condition begins to worsen call PCP or go to the ER.  Patient was given the office number and encouraged to call back with question or concerns.  : yes

## 2019-10-21 NOTE — Patient Outreach (Signed)
  [facility]) Care Management Chronic Special Needs Program    10/21/2019  Name: [name] [name], [name] [name]: May 18, 1941  MRN: [id number]   [name] [name] is enrolled in a chronic special needs plan. Client admitted on 10/09/2019 with respiratory failure with hypoxia. Discharged on 10/20/2019 to home with home health Physical therapy/Occupational therapy. Transition of care to be completed per utilization management.  Plan: Care plan updated and sent to client and primary care provider. RNCM will continue to follow and collaborate/care coordinate as needed.  Thea Silversmith, RN, MSN, [place] [phone]

## 2019-10-22 ENCOUNTER — Telehealth: Payer: Self-pay | Admitting: Oncology

## 2019-10-22 NOTE — Telephone Encounter (Signed)
I spoke with the patient's daughter, [name], to notify her of the new date and time of the patient's appointment with cardiology.  He will now see Dr. Cassie Freer O'Neal on October 30, 2019 at 11:20 AM.  The patient's daughter states that she will have him at the appointment.  She was advised to keep the follow-up appoint with Dr. Maylon Peppers on 10/27/2019 as previously scheduled.  Mikey Bussing, DNP, AGPCNP-BC, AOCNP

## 2019-10-23 ENCOUNTER — Encounter: Payer: Self-pay | Admitting: *Deleted

## 2019-10-25 DIAGNOSIS — Z7982 Long term (current) use of aspirin: Secondary | ICD-10-CM | POA: Diagnosis not present

## 2019-10-25 DIAGNOSIS — D630 Anemia in neoplastic disease: Secondary | ICD-10-CM | POA: Diagnosis not present

## 2019-10-25 DIAGNOSIS — Z7984 Long term (current) use of oral hypoglycemic drugs: Secondary | ICD-10-CM | POA: Diagnosis not present

## 2019-10-25 DIAGNOSIS — Z96611 Presence of right artificial shoulder joint: Secondary | ICD-10-CM | POA: Diagnosis not present

## 2019-10-25 DIAGNOSIS — E119 Type 2 diabetes mellitus without complications: Secondary | ICD-10-CM | POA: Diagnosis not present

## 2019-10-25 DIAGNOSIS — I110 Hypertensive heart disease with heart failure: Secondary | ICD-10-CM | POA: Diagnosis not present

## 2019-10-25 DIAGNOSIS — Z9181 History of falling: Secondary | ICD-10-CM | POA: Diagnosis not present

## 2019-10-25 DIAGNOSIS — M1990 Unspecified osteoarthritis, unspecified site: Secondary | ICD-10-CM | POA: Diagnosis not present

## 2019-10-25 DIAGNOSIS — E669 Obesity, unspecified: Secondary | ICD-10-CM | POA: Diagnosis not present

## 2019-10-25 DIAGNOSIS — C8440 Peripheral T-cell lymphoma, not classified, unspecified site: Secondary | ICD-10-CM | POA: Diagnosis not present

## 2019-10-25 DIAGNOSIS — J9691 Respiratory failure, unspecified with hypoxia: Secondary | ICD-10-CM | POA: Diagnosis not present

## 2019-10-25 DIAGNOSIS — M48061 Spinal stenosis, lumbar region without neurogenic claudication: Secondary | ICD-10-CM | POA: Diagnosis not present

## 2019-10-25 DIAGNOSIS — I5020 Unspecified systolic (congestive) heart failure: Secondary | ICD-10-CM | POA: Diagnosis not present

## 2019-10-27 ENCOUNTER — Other Ambulatory Visit: Payer: Self-pay

## 2019-10-27 ENCOUNTER — Inpatient Hospital Stay (HOSPITAL_BASED_OUTPATIENT_CLINIC_OR_DEPARTMENT_OTHER): Payer: HMO | Admitting: Hematology

## 2019-10-27 ENCOUNTER — Inpatient Hospital Stay: Payer: HMO

## 2019-10-27 ENCOUNTER — Inpatient Hospital Stay: Payer: HMO | Attending: Hematology

## 2019-10-27 ENCOUNTER — Encounter: Payer: Self-pay | Admitting: Hematology

## 2019-10-27 ENCOUNTER — Other Ambulatory Visit: Payer: Self-pay | Admitting: Hematology

## 2019-10-27 VITALS — BP 128/73 | HR 85 | Temp 97.3°F | Resp 19 | Ht 72.0 in

## 2019-10-27 DIAGNOSIS — K521 Toxic gastroenteritis and colitis: Secondary | ICD-10-CM

## 2019-10-27 DIAGNOSIS — Z5189 Encounter for other specified aftercare: Secondary | ICD-10-CM | POA: Insufficient documentation

## 2019-10-27 DIAGNOSIS — C8440 Peripheral T-cell lymphoma, not classified, unspecified site: Secondary | ICD-10-CM | POA: Diagnosis not present

## 2019-10-27 DIAGNOSIS — D473 Essential (hemorrhagic) thrombocythemia: Secondary | ICD-10-CM | POA: Diagnosis not present

## 2019-10-27 DIAGNOSIS — Z5111 Encounter for antineoplastic chemotherapy: Secondary | ICD-10-CM | POA: Diagnosis not present

## 2019-10-27 DIAGNOSIS — I5021 Acute systolic (congestive) heart failure: Secondary | ICD-10-CM | POA: Insufficient documentation

## 2019-10-27 DIAGNOSIS — D6481 Anemia due to antineoplastic chemotherapy: Secondary | ICD-10-CM | POA: Diagnosis not present

## 2019-10-27 DIAGNOSIS — T451X5A Adverse effect of antineoplastic and immunosuppressive drugs, initial encounter: Secondary | ICD-10-CM

## 2019-10-27 DIAGNOSIS — Z5112 Encounter for antineoplastic immunotherapy: Secondary | ICD-10-CM | POA: Insufficient documentation

## 2019-10-27 DIAGNOSIS — Z95828 Presence of other vascular implants and grafts: Secondary | ICD-10-CM

## 2019-10-27 DIAGNOSIS — D75839 Thrombocytosis, unspecified: Secondary | ICD-10-CM

## 2019-10-27 LAB — CMP (CANCER CENTER ONLY)
ALT: 41 U/L (ref 0–44)
AST: 34 U/L (ref 15–41)
Albumin: 3.6 g/dL (ref 3.5–5.0)
Alkaline Phosphatase: 78 U/L (ref 38–126)
Anion gap: 9 (ref 5–15)
BUN: 13 mg/dL (ref 8–23)
CO2: 27 mmol/L (ref 22–32)
Calcium: 9.1 mg/dL (ref 8.9–10.3)
Chloride: 97 mmol/L — ABNORMAL LOW (ref 98–111)
Creatinine: 0.74 mg/dL (ref 0.61–1.24)
GFR, Est AFR Am: >60 mL/min (ref >60)
GFR, Estimated: >60 mL/min (ref >60)
Glucose, Bld: 107 mg/dL — ABNORMAL HIGH (ref 70–99)
Potassium: 3.9 mmol/L (ref 3.5–5.1)
Sodium: 133 mmol/L — ABNORMAL LOW (ref 135–145)
Total Bilirubin: 0.4 mg/dL (ref 0.3–1.2)
Total Protein: 7.1 g/dL (ref 6.5–8.1)

## 2019-10-27 LAB — CBC WITH DIFFERENTIAL (CANCER CENTER ONLY)
Abs Immature Granulocytes: 0.1 10*3/uL — ABNORMAL HIGH (ref 0.00–0.07)
Basophils Absolute: 0.3 10*3/uL — ABNORMAL HIGH (ref 0.0–0.1)
Basophils Relative: 4 %
Eosinophils Absolute: 0.2 10*3/uL (ref 0.0–0.5)
Eosinophils Relative: 2 %
HCT: 29.2 % — ABNORMAL LOW (ref 39.0–52.0)
Hemoglobin: 9.4 g/dL — ABNORMAL LOW (ref 13.0–17.0)
Immature Granulocytes: 1 %
Lymphocytes Relative: 7 %
Lymphs Abs: 0.6 10*3/uL — ABNORMAL LOW (ref 0.7–4.0)
MCH: 27.5 pg (ref 26.0–34.0)
MCHC: 32.2 g/dL (ref 30.0–36.0)
MCV: 85.4 fL (ref 80.0–100.0)
Monocytes Absolute: 1.3 10*3/uL — ABNORMAL HIGH (ref 0.1–1.0)
Monocytes Relative: 15 %
Neutro Abs: 6.2 10*3/uL (ref 1.7–7.7)
Neutrophils Relative %: 71 %
Platelet Count: 576 10*3/uL — ABNORMAL HIGH (ref 150–400)
RBC: 3.42 MIL/uL — ABNORMAL LOW (ref 4.22–5.81)
RDW: 17.1 % — ABNORMAL HIGH (ref 11.5–15.5)
WBC Count: 8.6 10*3/uL (ref 4.0–10.5)
nRBC: 0 % (ref 0.0–0.2)

## 2019-10-27 MED ORDER — SODIUM CHLORIDE 0.9 % IV SOLN
180.0000 mg | Freq: Once | INTRAVENOUS | Status: AC
  Administered 2019-10-27: 180 mg via INTRAVENOUS
  Filled 2019-10-27: qty 36

## 2019-10-27 MED ORDER — SODIUM CHLORIDE 0.9 % IV SOLN
750.0000 mg/m2 | Freq: Once | INTRAVENOUS | Status: AC
  Administered 2019-10-27: 1940 mg via INTRAVENOUS
  Filled 2019-10-27: qty 97

## 2019-10-27 MED ORDER — SODIUM CHLORIDE 0.9 % IV SOLN
Freq: Once | INTRAVENOUS | Status: AC
  Filled 2019-10-27: qty 5

## 2019-10-27 MED ORDER — PALONOSETRON HCL INJECTION 0.25 MG/5ML
0.2500 mg | Freq: Once | INTRAVENOUS | Status: AC
  Administered 2019-10-27: 0.25 mg via INTRAVENOUS

## 2019-10-27 MED ORDER — PEGFILGRASTIM 6 MG/0.6ML ~~LOC~~ PSKT
PREFILLED_SYRINGE | SUBCUTANEOUS | Status: AC
  Filled 2019-10-27: qty 0.6

## 2019-10-27 MED ORDER — LOPERAMIDE HCL 2 MG PO CAPS: ORAL_CAPSULE | ORAL | 2 refills | Status: DC

## 2019-10-27 MED ORDER — HEPARIN SOD (PORK) LOCK FLUSH 100 UNIT/ML IV SOLN
500.0000 [IU] | Freq: Once | INTRAVENOUS | Status: AC | PRN
  Administered 2019-10-27: 500 [IU]
  Filled 2019-10-27: qty 5

## 2019-10-27 MED ORDER — PEGFILGRASTIM 6 MG/0.6ML ~~LOC~~ PSKT
6.0000 mg | PREFILLED_SYRINGE | Freq: Once | SUBCUTANEOUS | Status: AC
  Administered 2019-10-27: 6 mg via SUBCUTANEOUS

## 2019-10-27 MED ORDER — PALONOSETRON HCL INJECTION 0.25 MG/5ML
INTRAVENOUS | Status: AC
  Filled 2019-10-27: qty 5

## 2019-10-27 MED ORDER — SODIUM CHLORIDE 0.9% FLUSH
10.0000 mL | INTRAVENOUS | Status: DC | PRN
  Administered 2019-10-27: 10 mL
  Filled 2019-10-27: qty 10

## 2019-10-27 MED ORDER — SODIUM CHLORIDE 0.9% FLUSH
10.0000 mL | Freq: Once | INTRAVENOUS | Status: AC
  Administered 2019-10-27: 10 mL via INTRAVENOUS
  Filled 2019-10-27: qty 10

## 2019-10-27 MED ORDER — SODIUM CHLORIDE 0.9 % IV SOLN
Freq: Once | INTRAVENOUS | Status: AC
  Filled 2019-10-27: qty 250

## 2019-10-27 NOTE — Patient Instructions (Signed)

## 2019-10-27 NOTE — Patient Instructions (Signed)
Cyclophosphamide Injection What is this medicine? CYCLOPHOSPHAMIDE (sye kloe FOSS fa mide) is a chemotherapy drug. It slows the growth of cancer cells. This medicine is used to treat many types of cancer like lymphoma, myeloma, leukemia, breast cancer, and ovarian cancer, to name a few. This medicine may be used for other purposes; ask your health care provider or pharmacist if you have questions. COMMON BRAND NAME(S): Cytoxan, Neosar What should I tell my health care provider before I take this medicine? They need to know if you have any of these conditions:  heart disease  history of irregular heartbeat  infection  kidney disease  liver disease  low blood counts, like white cells, platelets, or red blood cells  on hemodialysis  recent or ongoing radiation therapy  scarring or thickening of the lungs  trouble passing urine  an unusual or allergic reaction to cyclophosphamide, other medicines, foods, dyes, or preservatives  pregnant or trying to get pregnant  breast-feeding How should I use this medicine? This drug is usually given as an injection into a vein or muscle or by infusion into a vein. It is administered in a hospital or clinic by a specially trained health care professional. Talk to your pediatrician regarding the use of this medicine in children. Special care may be needed. Overdosage: If you think you have taken too much of this medicine contact a poison control center or emergency room at once. NOTE: This medicine is only for you. Do not share this medicine with others. What if I miss a dose? It is important not to miss your dose. Call your doctor or health care professional if you are unable to keep an appointment. What may interact with this medicine?  amphotericin B  azathioprine  certain antivirals for HIV or hepatitis  certain medicines for blood pressure, heart disease, irregular heart beat  certain medicines that treat or prevent blood clots  like warfarin  certain other medicines for cancer  cyclosporine  etanercept  indomethacin  medicines that relax muscles for surgery  medicines to increase blood counts  metronidazole This list may not describe all possible interactions. Give your health care provider a list of all the medicines, herbs, non-prescription drugs, or dietary supplements you use. Also tell them if you smoke, drink alcohol, or use illegal drugs. Some items may interact with your medicine. What should I watch for while using this medicine? Your condition will be monitored carefully while you are receiving this medicine. You may need blood work done while you are taking this medicine. Drink water or other fluids as directed. Urinate often, even at night. Some products may contain alcohol. Ask your health care professional if this medicine contains alcohol. Be sure to tell all health care professionals you are taking this medicine. Certain medicines, like metronidazole and disulfiram, can cause an unpleasant reaction when taken with alcohol. The reaction includes flushing, headache, nausea, vomiting, sweating, and increased thirst. The reaction can last from 30 minutes to several hours. Do not become pregnant while taking this medicine or for 1 year after stopping it. Women should inform their health care professional if they wish to become pregnant or think they might be pregnant. Men should not father a child while taking this medicine and for 4 months after stopping it. There is potential for serious side effects to an unborn child. Talk to your health care professional for more information. Do not breast-feed an infant while taking this medicine or for 1 week after stopping it. This medicine has  caused ovarian failure in some women. This medicine may make it more difficult to get pregnant. Talk to your health care professional if you are concerned about your fertility. This medicine has caused decreased sperm  counts in some men. This may make it more difficult to father a child. Talk to your health care professional if you are concerned about your fertility. Call your health care professional for advice if you get a fever, chills, or sore throat, or other symptoms of a cold or flu. Do not treat yourself. This medicine decreases your body's ability to fight infections. Try to avoid being around people who are sick. Avoid taking medicines that contain aspirin, acetaminophen, ibuprofen, naproxen, or ketoprofen unless instructed by your health care professional. These medicines may hide a fever. Talk to your health care professional about your risk of cancer. You may be more at risk for certain types of cancer if you take this medicine. If you are going to need surgery or other procedure, tell your health care professional that you are using this medicine. Be careful brushing or flossing your teeth or using a toothpick because you may get an infection or bleed more easily. If you have any dental work done, tell your dentist you are receiving this medicine. What side effects may I notice from receiving this medicine? Side effects that you should report to your doctor or health care professional as soon as possible:  allergic reactions like skin rash, itching or hives, swelling of the face, lips, or tongue  breathing problems  nausea, vomiting  signs and symptoms of bleeding such as bloody or black, tarry stools; red or dark brown urine; spitting up blood or brown material that looks like coffee grounds; red spots on the skin; unusual bruising or bleeding from the eyes, gums, or nose  signs and symptoms of heart failure like fast, irregular heartbeat, sudden weight gain; swelling of the ankles, feet, hands  signs and symptoms of infection like fever; chills; cough; sore throat; pain or trouble passing urine  signs and symptoms of kidney injury like trouble passing urine or change in the amount of  urine  signs and symptoms of liver injury like dark yellow or brown urine; general ill feeling or flu-like symptoms; light-colored stools; loss of appetite; nausea; right upper belly pain; unusually weak or tired; yellowing of the eyes or skin Side effects that usually do not require medical attention (report to your doctor or health care professional if they continue or are bothersome):  confusion  decreased hearing  diarrhea  facial flushing  hair loss  headache  loss of appetite  missed menstrual periods  signs and symptoms of low red blood cells or anemia such as unusually weak or tired; feeling faint or lightheaded; falls  skin discoloration This list may not describe all possible side effects. Call your doctor for medical advice about side effects. You may report side effects to FDA at 1-800-FDA-1088. Where should I keep my medicine? This drug is given in a hospital or clinic and will not be stored at home. NOTE: This sheet is a summary. It may not cover all possible information. If you have questions about this medicine, talk to your doctor, pharmacist, or health care provider.  2020 [employer] (2019-07-14 09:53:29) Brentuximab vedotin solution for injection What is this medicine? BRENTUXIMAB VEDOTIN (bren TUX see mab ve DOE tin) is a monoclonal antibody and a chemotherapy drug. It is used for treating Hodgkin lymphoma and certain non-Hodgkin lymphomas, such as anaplastic  large-cell lymphoma, mycosis fungoides, and peripheral T-cell lymphoma. This medicine may be used for other purposes; ask your health care provider or pharmacist if you have questions. COMMON BRAND NAME(S): ADCETRIS What should I tell my health care provider before I take this medicine? They need to know if you have any of these conditions:  immune system problems  infection (especially a virus infection such as chickenpox, cold sores, or herpes)  kidney disease  liver disease  low  blood counts, like low white cell, platelet, or red cell counts  tingling of the fingers or toes, or other nerve disorder  an unusual or allergic reaction to brentuximab vedotin, other medicines, foods, dyes, or preservatives  pregnant or trying to get pregnant  breast-feeding How should I use this medicine? This medicine is for infusion into a vein. It is given by a health care professional in a hospital or clinic setting. Talk to your pediatrician regarding the use of this medicine in children. Special care may be needed. Overdosage: If you think you have taken too much of this medicine contact a poison control center or emergency room at once. NOTE: This medicine is only for you. Do not share this medicine with others. What if I miss a dose? It is important not to miss your dose. Call your doctor or health care professional if you are unable to keep an appointment. What may interact with this medicine? This medicine may interact with the following medications:  ketoconazole  rifampin  St. John's wort; Hypericum perforatum This list may not describe all possible interactions. Give your health care provider a list of all the medicines, herbs, non-prescription drugs, or dietary supplements you use. Also tell them if you smoke, drink alcohol, or use illegal drugs. Some items may interact with your medicine. What should I watch for while using this medicine? Visit your doctor for checks on your progress. This drug may make you feel generally unwell. Report any side effects. Continue your course of treatment even though you feel ill unless your doctor tells you to stop. Call your doctor or health care professional for advice if you get a fever, chills or sore throat, or other symptoms of a cold or flu. Do not treat yourself. This drug decreases your body's ability to fight infections. Try to avoid being around people who are sick. This medicine may increase your risk to bruise or bleed. Call  your doctor or health care professional if you notice any unusual bleeding. In some patients, this medicine may cause a serious brain infection that may cause death. If you have any problems seeing, thinking, speaking, walking, or standing, tell your doctor right away. If you cannot reach your doctor, urgently seek other source of medical care. Do not become pregnant while taking this medicine or for 6 months after stopping it. Women should inform their doctor if they wish to become pregnant or think they might be pregnant. Men should not father a child while taking this medicine and for 6 months after stopping it. There is a potential for serious side effects to an unborn child. Talk to your health care professional or pharmacist for more information. Do not breast-feed an infant while taking this medicine. This may interfere with the ability to father a child. You should talk to your doctor or health care professional if you are concerned about your fertility. What side effects may I notice from receiving this medicine? Side effects that you should report to your doctor or health care professional  as soon as possible:  allergic reactions like skin rash, itching or hives, swelling of the face, lips, or tongue  changes in emotions or moods  diarrhea  low blood counts - this medicine may decrease the number of white blood cells, red blood cells and platelets. You may be at increased risk for infections and bleeding.  pain, tingling, numbness in the hands or feet  redness, blistering, peeling or loosening of the skin, including inside the mouth  shortness of breath  signs of infection - fever or chills, cough, sore throat, pain or difficulty passing urine  signs of decreased platelets or bleeding - bruising, pinpoint red spots on the skin, black, tarry stools, blood in the urine  signs of decreased red blood cells - unusually weak or tired, fainting spells, lightheadedness  signs of liver  injury like dark yellow or brown urine; general ill feeling or flu-like symptoms; light-colored stools; loss of appetite; nausea; right upper belly pain; yellowing of the eyes or skin  stomach pain  sudden numbness or weakness of the face, arm or leg  vomiting Side effects that usually do not require medical attention (report to your doctor or health care professional if they continue or are bothersome):  constipation  dizziness  headache  muscle pain  tiredness This list may not describe all possible side effects. Call your doctor for medical advice about side effects. You may report side effects to FDA at 1-800-FDA-1088. Where should I keep my medicine? This drug is given in a hospital or clinic and will not be stored at home. NOTE: This sheet is a summary. It may not cover all possible information. If you have questions about this medicine, talk to your doctor, pharmacist, or health care provider.  2020 [employer] (2017-09-10 14:10:02)

## 2019-10-27 NOTE — Progress Notes (Signed)
[place] OFFICE PROGRESS NOTE  Patient Care Team: Colon Branch, MD as PCP - General [name], [name], MD as PCP - Cardiology (Cardiology) [name], [name], MD as Consulting Physician (Cardiology) [name], MD as Consulting Physician (Gastroenterology) [name], MD as Consulting Physician (Orthopedic Surgery) [name], MD as Consulting Physician (Physical Medicine and Rehabilitation) Luretha Rued, RN as [place] [facility] [name], MD as Consulting Physician (Otolaryngology) Tish Men, MD as Medical Oncologist (Oncology) Cordelia Poche, RN as Oncology Nurse Navigator  HEME/ONC OVERVIEW: 1. Stage IV angioimmunoblastic T-cell lymphoma -07/2019: L neck LN FNA non-diagnostic  -08/2019:   Multiple enlarged cervical, mediastinal, bulky retroperitoneal LN's (~5cm), and irregular RLL nodule (1.6x1.1cm) on CT   Excisional left cervical LN bx, path showed angioimmunoblastic T-cell lymphoma, CD30+ (at least 25%)  FDG-avid LN's in the neck, chest and abdomen/pelvis; no bone involvement  Small lymphoid aggregates on the bone marrow biopsy, suspicious for lymphoma involvement   LVEF 50-38%, Grade I diastolic dysfunction  -88/2800 - present: Adcetris + CHP w/ Onpro  TREATMENT SUMMARY:  09/30/2019 - present: Adcetris + CHP w/ Onpro  PERTINENT NON-HEM/ONC PROBLEMS: 1. Acute systolic HF exacerbation -09/2019: admitted for acute systolic heart failure exacerbation; LVEF 45-50%, troponin I peaked at 862, possibly demand ischemia   ASSESSMENT & PLAN:   Stage IV angioimmunoblastic T-cell lymphoma -S/p 1 cycle of CHP + Adcetris -Treatment complicated by new onset, acute systolic heart failure exacerbation with LVEF 45-50% -While the timing of the decline in LVEF is less with anthracycline-related toxicity (usually takes months to years), the decline is over 15% and it would be reasonable to hold doxorubin for Cycle 2 of the  treatment -I have ordered echocardiogram in 1-2 weeks to assess for any interval improvement, and if LVEF normalizes, we can consider resuming doxorubin with Cycle 3 of treatment  -See the management of treatment-related toxicities below -Plan for 6 cycles (up to 8 cycles in the trial) -PRN anti-emetics: Zofran, Compazine, and Ativan   New onset heart failure with reduced LVEF -LVEF 45-50% in 09/2019, possibly precipitated by volume overload with demand ischemia that can result in transient weakening of the heart -The timing of the decline in LVEF is less consistent with anthracycline-induced cardiomyopathy, which usually takes months to years, but could not be ruled out -Repeat TTE in 1-2 weeks as outlined above -Patient also has cardiology appt on 10/30/2019, and may benefit from perfusion scan to rule out ischemic cardiomyopathy   Chemotherapy-associated anemia -Secondary to chemotherapy, as well as anemia of chronic disease and hemodilution -Hgb 9.4, stable -Patient denies any symptom of bleeding -We will monitor for now  Thrombocytosis -Likely reactive in the setting of malignancy and recent G-CSF  -Plts 576k, higher since recent discharge  -We will monitor it for now  Hyponatremia -Possibly due to mild dehydration -Na 133 today, fluctuating but overall stable  -Patient is currently on Lasix 91m daily -No evidence of volume overload on exam -I encouraged the patient to maintain adequate hydration, and to follow up with his cardiologist regarding fluid management -As he was recently admitted for acute systolic heart failure exacerbation, I will hold off administering IV fluid for now   Chemotherapy-associated diarrhea -Secondary to chemotherapy -Currently improving; no abdominal pain, hematochezia or melena  -I have prescribed PRN Imodium; if no improvement, patient is instructed to call back and we can add PRN Lomotil   Orders Placed This Encounter  Procedures  .  ECHOCARDIOGRAM LIMITED  Standing Status:   Future    Standing Expiration Date:   01/24/2021    Order Specific Question:   Where should this test be performed    Answer:   [facility]    Order Specific Question:   Perflutren DEFINITY (image enhancing agent) should be administered unless hypersensitivity or allergy exist    Answer:   Administer Perflutren    Order Specific Question:   Reason for exam-Echo    Answer:   Cardiomyopathy-Unspecified  425.9 / I42.9    Order Specific Question:   Reason for exam-Echo    Answer:   Chemo  V67.2 / Z09    Order Specific Question:   Other Comments    Answer:   New onset systolic heart failure, evaluate LVEF    All questions were answered. The patient knows to call the clinic with any problems, questions or concerns. No barriers to learning was detected.  Return in 2 weeks for labs, port flush, echo results and clinic appt prior to Cycle 3 of chemotherapy.  Tish Men, MD 10/27/2019 11:13 AM  CHIEF COMPLAINT: "I am doing a little better"  INTERVAL HISTORY: Mr. Bonneau returns to clinic for follow-up of Stage IV angioimmunoblastic T-cell lymphoma on chemotherapy.  The patient was recently hospitalized for acute, new onset systolic heart failure.  Echocardiogram showed a new drop in LVEF (45 to 50%).  Troponin also peaked in the 800's.  Patient reports during the hospitalization, he also developed new onset diarrhea, multiple times a day, moderate volume, without any abdominal pain, nausea, vomiting, hematochezia, or melena.  He did not take any medication for the diarrhea.  Over the past 2 days, diarrhea has resolved, and his bowel movement has returned to mostly normal consistency.  He is eating reasonably well, but does not drink as much fluid.  He denies any other complaint today.  REVIEW OF SYSTEMS:   Constitutional: ( - ) fevers, ( - )  chills , ( - ) night sweats Eyes: ( - ) blurriness of vision, ( - ) double vision, ( - ) watery eyes Ears,  nose, mouth, throat, and face: ( - ) mucositis, ( - ) sore throat Respiratory: ( - ) cough, ( - ) dyspnea, ( - ) wheezes Cardiovascular: ( - ) palpitation, ( - ) chest discomfort, ( - ) lower extremity swelling Gastrointestinal:  ( - ) nausea, ( - ) heartburn, ( + ) change in bowel habits Skin: ( - ) abnormal skin rashes Lymphatics: ( - ) new lymphadenopathy, ( - ) easy bruising Neurological: ( - ) numbness, ( - ) tingling, ( - ) new weaknesses Behavioral/Psych: ( - ) mood change, ( - ) new changes  All other systems were reviewed with the patient and are negative.  SUMMARY OF ONCOLOGIC HISTORY: Oncology History  Angioimmunoblastic lymphoma ([place])  08/25/2019 Imaging   CT neck: IMPRESSION: Right greater than left cervical and supraclavicular adenopathy with some nodes demonstrating potential cystic change or necrosis. This may be infectious/inflammatory or neoplastic and tissue sampling is recommended.   08/25/2019 Imaging   CT chest: IMPRESSION: Multiple areas of adenopathy involving the lower neck, left axilla, subcarinal station, and largest areas of nodal masses throughout the visualized retroperitoneum. Splenomegaly.   Scattered inflammatory stranding adjacent to the distal pancreatic body-tail. 11 cm lobulated cyst in the left kidney.   1.6 and 1.1 cm irregular nodules in the medial basilar segment of the right lower lobe.   09/01/2019 Pathology Results   FINAL  MICROSCOPIC DIAGNOSIS:   A. LYMPH NODE, RIGHT SUPRACLAVICULAR, EXCISION:  - Angioimmunoblastic T-cell lymphoma.   COMMENT:   Sections reveal soft tissue with mixed inflammation and foreign body  giant cell reaction with focally entrapped abnormal lymph nodes. The  abnormal lymph nodes are effaced with numerous medium sized atypical  lymphocytes with clear cytoplasm. There are admixed eosinophils, plasma  cells, and smaller lymphocytes. There is increased vascularity. These  atypical cells extend into the  surrounding tissue with the mixed soft  tissue reaction. The abnormal lymphocytes are positive for CD2, CD3,  CD4, PD1, CXCL13, bcl-6, bcl-2, and CD10. CD21 and CD23 highlight  disrupted follicular dendritic networks. There is weak CD30 expression  (25%). They are negative for CD7, CD8, CD15, and CD56. CD138 highlights  plasma cells that are polytypic by light chain in situ hybridization.  EBV in situ hybridization is negative in the B-cell and T-cells. CD20  highlights residual cortical B-cells. GMS, AFB, and PAS are negative for  organisms. Flow cytometry ([id number]) reveals abundant T-cells with  decreased/absent CD7. PCR for T-cell receptor gamma gene rearrangement  is positive. Overall, these findings are consistent with involvement by  angioimmunoblastic T-cell lymphoma. A preliminary diagnosis was called  to Dr. Erik Obey on 04/17/9484. Dr. Maylon Peppers was paged on 09/19/19 for the  final diagnosis.    09/08/2019 Initial Diagnosis   T-cell lymphoma ([place])   09/17/2019 Imaging   Pet: IMPRESSION: 1. Active lymphoma within the neck, chest, abdomen, and pelvis, as detailed above. ([place]) 4 2. Development of small bilateral pleural effusions and abdominal ascites, possibly related to fluid overload. 3. Coronary artery atherosclerosis. Aortic Atherosclerosis (ICD10-I70.0).   09/30/2019 -  Chemotherapy   The patient had DOXOrubicin (ADRIAMYCIN) chemo injection 130 mg, 50 mg/m2 = 130 mg, Intravenous,  Once, 1 of 5 cycles Administration: 130 mg (09/30/2019) palonosetron (ALOXI) injection 0.25 mg, 0.25 mg, Intravenous,  Once, 1 of 6 cycles Administration: 0.25 mg (09/30/2019) pegfilgrastim (NEULASTA ONPRO KIT) injection 6 mg, 6 mg, Subcutaneous, Once, 1 of 6 cycles Administration: 6 mg (09/30/2019) cyclophosphamide (CYTOXAN) 1,940 mg in sodium chloride 0.9 % 250 mL chemo infusion, 750 mg/m2 = 1,940 mg, Intravenous,  Once, 1 of 6 cycles Administration: 1,940 mg (09/30/2019) brentuximab vedotin  (ADCETRIS) 135 mg in sodium chloride 0.9 % 100 mL chemo infusion, 135 mg (75 % of original dose 180 mg), Intravenous,  Once, 1 of 6 cycles Dose modification: 180 mg (original dose 180 mg, Cycle 1, Reason: Other (see comments), Comment: Maximum dose Adcetris ), 135 mg (original dose 180 mg, Cycle 1, Reason: Change in SCr/CrCl) Administration: 135 mg (09/30/2019) fosaprepitant (EMEND) 150 mg, dexamethasone (DECADRON) 12 mg in sodium chloride 0.9 % 145 mL IVPB, , Intravenous,  Once, 1 of 6 cycles Administration:  (09/30/2019)  for chemotherapy treatment.      I have reviewed the past medical history, past surgical history, social history and family history with the patient and they are unchanged from previous note.  ALLERGIES:  is allergic to doxycycline.  MEDICATIONS:  Current Outpatient Medications  Medication Sig Dispense Refill  . acetaminophen (TYLENOL) 500 MG tablet Take 500 mg by mouth every 6 (six) hours as needed for moderate pain.    Marland Kitchen allopurinol (ZYLOPRIM) 300 MG tablet Take 1 tablet (300 mg total) by mouth daily. 30 tablet 3  . aspirin 81 MG tablet Take 81 mg by mouth daily.    Marland Kitchen aspirin-sod bicarb-citric acid (ALKA-SELTZER) 325 MG TBEF tablet Take 325 mg by mouth every 6 (six) hours as  needed (Heart Burn).    . carvedilol (COREG) 12.5 MG tablet Take 1 tablet (12.5 mg total) by mouth 2 (two) times daily with a meal. 60 tablet 6  . furosemide (LASIX) 40 MG tablet Take 1 tablet (40 mg total) by mouth daily as needed for fluid or edema. 30 tablet 11  . HYDROcodone-acetaminophen (NORCO) 7.5-325 MG tablet Take 1 tablet by mouth 3 (three) times daily as needed for moderate pain. 60 tablet 0  . LORazepam (ATIVAN) 0.5 MG tablet Take 1 tablet (0.5 mg total) by mouth every 6 (six) hours as needed (Nausea or vomiting). 30 tablet 0  . losartan (COZAAR) 100 MG tablet Take 1 tablet (100 mg total) by mouth daily. 90 tablet 1  . metFORMIN (GLUCOPHAGE) 500 MG tablet Take 1 tablet (500 mg total) by  mouth daily with breakfast. 90 tablet 1  . ondansetron (ZOFRAN) 8 MG tablet Take 1 tablet (8 mg total) by mouth 2 (two) times daily as needed for refractory nausea / vomiting. Start on day 3 after cyclophosphamide chemotherapy. 30 tablet 1  . potassium chloride (KLOR-CON M10) 10 MEQ tablet Take 1 tablet (10 mEq total) by mouth daily. 90 tablet 1  . predniSONE (DELTASONE) 20 MG tablet TAKE 5 TABLETS (100 MG TOTAL) BY MOUTH DAILY FOR 5 DAYS. TAKE ON DAYS 1 5 OF CHEMOTHERAPY.    Marland Kitchen prochlorperazine (COMPAZINE) 10 MG tablet Take 1 tablet (10 mg total) by mouth every 6 (six) hours as needed (Nausea or vomiting). 30 tablet 6  . protein supplement shake (PREMIER PROTEIN) LIQD Take 2 oz by mouth daily.    . simvastatin (ZOCOR) 80 MG tablet Take 0.5 tablets (40 mg total) by mouth daily. 45 tablet 1  . traMADol (ULTRAM) 50 MG tablet Take 50 mg by mouth every 6 (six) hours as needed.    . loperamide (IMODIUM) 2 MG capsule Take 2 at diarrhea onset, then 1 every 2hr until 12hrs with no BM. May take 2 every 4hrs at night. If diarrhea recurs repeat. 100 capsule 2  . Nutritional Supplements (GLUCERNA 1.0 CAL/CARBSTEADY) LIQD Take 1 Can by mouth daily. Okay to drink up to 3 cans/bottles daily to replace meals as needed. (Patient not taking: Reported on 10/09/2019) 21330 mL 6   No current facility-administered medications for this visit.    PHYSICAL EXAMINATION: ECOG PERFORMANCE STATUS: 2 - Symptomatic, <50% confined to bed  Today's Vitals   10/27/19 1042  BP: 128/73  Pulse: 85  Resp: 19  Temp: (!) 97.3 F (36.3 C)  TempSrc: Temporal  SpO2: 96%  Height: 6' (1.829 m)  PainSc: 0-No pain   Body mass index is 35.91 kg/m.  Filed Weights    GENERAL: alert, no distress and comfortable sitting in a wheelchair  SKIN: skin color, texture, turgor are normal, no rashes or significant lesions EYES: conjunctiva are pink and non-injected, sclera clear OROPHARYNX: no exudate, no erythema; lips, buccal mucosa, and  tongue normal  NECK: supple, non-tender LUNGS: clear to auscultation with normal breathing effort HEART: regular rate & rhythm and no murmurs and no lower extremity edema ABDOMEN: soft, non-tender, non-distended, normal bowel sounds Musculoskeletal: no cyanosis of digits and no clubbing  PSYCH: alert & oriented x 3, fluent speech  LABORATORY DATA:  I have reviewed the data as listed    Component Value Date/Time   NA 133 (L) 10/27/2019 1019   K 3.9 10/27/2019 1019   CL 97 (L) 10/27/2019 1019   CO2 27 10/27/2019 1019   GLUCOSE 107 (  H) 10/27/2019 1019   GLUCOSE 101 (H) 08/30/2006 1117   BUN 13 10/27/2019 1019   CREATININE 0.74 10/27/2019 1019   CREATININE 1.41 (H) 06/06/2019 1528   CALCIUM 9.1 10/27/2019 1019   PROT 7.1 10/27/2019 1019   ALBUMIN 3.6 10/27/2019 1019   AST 34 10/27/2019 1019   ALT 41 10/27/2019 1019   ALKPHOS 78 10/27/2019 1019   BILITOT 0.4 10/27/2019 1019   GFRNONAA >60 10/27/2019 1019   GFRAA >60 10/27/2019 1019    No results found for: SPEP, UPEP  Lab Results  Component Value Date   WBC 8.6 10/27/2019   NEUTROABS 6.2 10/27/2019   HGB 9.4 (L) 10/27/2019   HCT 29.2 (L) 10/27/2019   MCV 85.4 10/27/2019   PLT 576 (H) 10/27/2019      Chemistry      Component Value Date/Time   NA 133 (L) 10/27/2019 1019   K 3.9 10/27/2019 1019   CL 97 (L) 10/27/2019 1019   CO2 27 10/27/2019 1019   BUN 13 10/27/2019 1019   CREATININE 0.74 10/27/2019 1019   CREATININE 1.41 (H) 06/06/2019 1528      Component Value Date/Time   CALCIUM 9.1 10/27/2019 1019   ALKPHOS 78 10/27/2019 1019   AST 34 10/27/2019 1019   ALT 41 10/27/2019 1019   BILITOT 0.4 10/27/2019 1019       RADIOGRAPHIC STUDIES: I have personally reviewed the radiological images as listed below and agreed with the findings in the report. CT ANGIO CHEST PE W OR WO CONTRAST  Result Date: 10/15/2019 CLINICAL DATA:  PE suspected, high pretest probability. Elevated D-dimer and shortness of breath.  History of T-cell lymphoma, per earlier radiology reports. EXAM: CT ANGIOGRAPHY CHEST WITH CONTRAST TECHNIQUE: Multidetector CT imaging of the chest was performed using the standard protocol during bolus administration of intravenous contrast. Multiplanar CT image reconstructions and MIPs were obtained to evaluate the vascular anatomy. CONTRAST:  148m OMNIPAQUE IOHEXOL 350 MG/ML SOLN COMPARISON:  Chest CT angiogram dated 08/25/2019. FINDINGS: Cardiovascular: Study is limited due to patient breathing motion artifact and attenuation artifact. Given these limitations, there is no convincing pulmonary embolism identified. No thoracic aortic aneurysm or evidence of aortic dissection. Cardiomegaly. No pericardial effusion. Mediastinum/Nodes: Grossly stable subcarinal lymphadenopathy. Esophagus is unremarkable. Trachea is unremarkable. Lungs/Pleura: Ground-glass consolidations are present within the LEFT upper lobe, lingula and LEFT lower lobe. More confluent consolidation is present within the anterior aspects of the LEFT upper lobe. RIGHT pleural effusion, moderate to large in size, with adjacent atelectasis. Small LEFT pleural effusion. Upper Abdomen: Low-density material surrounding the pancreatic head and proximal pancreatic body, extending into the portacaval space, presumably patient's known abdominal lymphadenopathy, incompletely imaged at the lower portion of the study. Small amount of free fluid in the upper abdomen, as described on the earlier PET-CT report of 09/17/2019. w Musculoskeletal: No acute or suspicious osseous finding. Bilateral axillary lymphadenopathy, LEFT greater than RIGHT, as previously described. Review of the MIP images confirms the above findings. IMPRESSION: 1. No pulmonary embolism seen, but with fairly significant study limitations as detailed above. 2. New ground-glass opacities within the LEFT upper lobe, lingula and LEFT lower lobe. Differential includes atypical pneumonias such as  viral (including COVID-19 pneumonia) or fungal, interstitial pneumonias, edema related to volume overload/CHF, chronic interstitial diseases, hypersensitivity pneumonitis, and respiratory bronchiolitis. 3. More confluent consolidation within the anterior aspects of the LEFT upper lobe, also suspicious for pneumonia. 4. RIGHT pleural effusion, moderate to large in size, with adjacent atelectasis. 5. Bilateral axillary  lymphadenopathy, LEFT greater than RIGHT, as previously described, compatible with previous radiology reports describing T-cell lymphoma/ischemia. Also grossly stable subcarinal and upper abdominal lymphadenopathy. 6. Small amount of free fluid in the upper abdomen, as described on the earlier PET-CT report of 09/17/2019. 7. Cardiomegaly. These results will be called to the ordering clinician or representative by the Radiologist Assistant, and communication documented in the PACS or zVision Dashboard. Electronically Signed   By: Franki Cabot M.D.   On: 10/15/2019 16:15   DG Chest Port 1 View  Result Date: 10/15/2019 CLINICAL DATA:  Shortness of breath EXAM: PORTABLE CHEST 1 VIEW COMPARISON:  10/08/2019 FINDINGS: Right Port-A-Cath remains in place, with the tip in the SVC. Cardiomegaly, vascular congestion. Improved aeration of the lungs. Minimal residual patchy opacities in both lungs. No visible significant effusions or acute bony abnormality. IMPRESSION: Improved aeration in the lungs. Minimal residual patchy bilateral airspace disease. No visible effusions. Cardiomegaly, vascular congestion. Electronically Signed   By: Rolm Baptise M.D.   On: 10/15/2019 09:25   DG Chest Port 1 View  Result Date: 10/08/2019 CLINICAL DATA:  Cough, shortness of breath, weakness EXAM: PORTABLE CHEST 1 VIEW COMPARISON:  Radiograph 09/23/2019 FINDINGS: Globally increased opacity in the right hemithorax with indistinctness of the right lung base likely reflecting some layering pleural fluid on a background of  more diffuse interstitial airspace opacities. Layering left pleural effusion is present as well. A right IJ approach Port-A-Cath tip terminates in the superior cavoatrial junction. Cardiomegaly is similar to priors. IMPRESSION: Patchy interstitial and airspace opacity throughout both lungs with layering bilateral effusions. Appearance favoring CHF/volume overload though infection could have a similar appearance. Electronically Signed   By: Lovena Le M.D.   On: 10/08/2019 22:45   Xray, abd (2 view)  Result Date: 10/02/2019 CLINICAL DATA:  Nausea, abdominal distension EXAM: ABDOMEN - 2 VIEW COMPARISON:  None. FINDINGS: Examination is limited secondary to poor penetration from patient body habitus. The bowel gas pattern is normal. There is no evidence of free air. No radio-opaque calculi or other significant radiographic abnormality is seen. IMPRESSION: Nonobstructive bowel gas pattern. Electronically Signed   By: Davina Poke M.D.   On: 10/02/2019 16:14   ECHOCARDIOGRAM COMPLETE  Result Date: 10/09/2019   ECHOCARDIOGRAM REPORT   Patient Name:   JERIC SLAGEL Date of Exam: 10/09/2019 Medical Rec #:  [id number]     Height:       72.0 in Accession #:    [id number]    Weight:       304.5 lb Date of Birth:  10-Feb-1941     BSA:          2.55 m Patient Age:    53 years      BP:           131/81 mmHg Patient Gender: M             HR:           85 bpm. Exam Location:  Inpatient Procedure: 2D Echo, Cardiac Doppler, Color Doppler and Intracardiac            Opacification Agent Indications:    Congestive Heart Failure 428.0  History:        Patient has prior history of Echocardiogram examinations, most                 recent 09/12/2019. CHF, Arrythmias:PVC; Risk                 Factors:Hypertension, Diabetes,  Dyslipidemia and Former Smoker.                 Elevated troponin. GERD. Palpitations.  Sonographer:    Paulita Fujita RDCS Referring Phys: [id number] [place]  1. Left ventricular ejection  fraction, by visual estimation, is 45 to 50%. The left ventricle has mildly decreased function. There is no left ventricular hypertrophy.  2. Mild hypokinesis of the left ventricular, mid-apical anterior wall and apical segment.  3. Definity contrast agent was given IV to delineate the left ventricular endocardial borders.  4. Left ventricular diastolic parameters are consistent with Grade I diastolic dysfunction (impaired relaxation).  5. Mild to moderately dilated left ventricular internal cavity size.  6. The left ventricle demonstrates regional wall motion abnormalities.  7. Global right ventricle has low normal systolic function.The right ventricular size is normal. No increase in right ventricular wall thickness.  8. Left atrial size was normal.  9. Right atrial size was normal. 10. The mitral valve is grossly normal. Trivial mitral valve regurgitation. 11. The tricuspid valve is grossly normal. Tricuspid valve regurgitation is mild. 12. The aortic valve is tricuspid. Aortic valve regurgitation is trivial. Mild aortic valve sclerosis without stenosis. 13. The pulmonic valve was grossly normal. Pulmonic valve regurgitation is not visualized. 14. Aortic dilatation noted. 15. 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 16. Mildly elevated pulmonary artery systolic pressure. 17. The inferior vena cava is dilated in size with >50% respiratory variability, suggesting right atrial pressure of 8 mmHg. FINDINGS  Left Ventricle: Left ventricular ejection fraction, by visual estimation, is 45 to 50%. The left ventricle has mildly decreased function. Mild hypokinesis of the left ventricular, mid-apical anterior wall and apical segment. Definity contrast agent was given IV to delineate the left ventricular endocardial borders. The left ventricle demonstrates regional wall motion abnormalities. The left ventricular internal cavity size was mildly to moderately dilated left ventricle. There is no left  ventricular hypertrophy. Left ventricular diastolic parameters are consistent with Grade I diastolic dysfunction (impaired relaxation). Indeterminate filling pressures. Right Ventricle: The right ventricular size is normal. No increase in right ventricular wall thickness. Global RV systolic function is has low normal systolic function. The tricuspid regurgitant velocity is 2.62 m/s, and with an assumed right atrial pressure of 8 mmHg, the estimated right ventricular systolic pressure is mildly elevated at 35.5 mmHg. Left Atrium: Left atrial size was normal in size. Right Atrium: Right atrial size was normal in size Pericardium: There is no evidence of pericardial effusion. Mitral Valve: The mitral valve is grossly normal. Trivial mitral valve regurgitation. Tricuspid Valve: The tricuspid valve is grossly normal. Tricuspid valve regurgitation is mild. Aortic Valve: The aortic valve is tricuspid. Aortic valve regurgitation is trivial. Mild aortic valve sclerosis is present, with no evidence of aortic valve stenosis. Pulmonic Valve: The pulmonic valve was grossly normal. Pulmonic valve regurgitation is not visualized. Pulmonic regurgitation is not visualized. Aorta: Aortic dilatation noted. 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 Venous: The inferior vena cava is dilated in size with greater than 50% respiratory variability, suggesting right atrial pressure of 8 mmHg. IAS/Shunts: No atrial level shunt detected by color flow Doppler.  LEFT VENTRICLE PLAX 2D LVIDd:         6.30 cm       Diastology LVIDs:         4.20 cm       LV e' lateral:   10.30 cm/s LV PW:  1.00 cm       LV E/e' lateral: 6.3 LV IVS:        1.00 cm       LV e' medial:    7.18 cm/s LVOT diam:     2.20 cm       LV E/e' medial:  9.1 LV SV:         123 ml LV SV Index:   45.27 LVOT Area:     3.80 cm  LV Volumes (MOD) LV area d, A2C:    50.30 cm LV area d, A4C:    50.10 cm LV area s, A2C:    33.40 cm LV area s, A4C:    30.20  cm LV major d, A2C:   10.20 cm LV major d, A4C:   9.67 cm LV major s, A2C:   8.58 cm LV major s, A4C:   7.69 cm LV vol d, MOD A2C: 208.0 ml LV vol d, MOD A4C: 214.0 ml LV vol s, MOD A2C: 111.0 ml LV vol s, MOD A4C: 98.8 ml LV SV MOD A2C:     97.0 ml LV SV MOD A4C:     214.0 ml LV SV MOD BP:      106.7 ml RIGHT VENTRICLE RV S prime:     12.90 cm/s TAPSE (M-mode): 2.2 cm LEFT ATRIUM             Index       RIGHT ATRIUM           Index LA diam:        3.70 cm 1.45 cm/m  RA Area:     15.60 cm LA Vol (A2C):   77.8 ml 30.54 ml/m RA Volume:   35.20 ml  13.82 ml/m LA Vol (A4C):   71.1 ml 27.91 ml/m LA Biplane Vol: 76.7 ml 30.11 ml/m  AORTIC VALVE LVOT Vmax:   131.00 cm/s LVOT Vmean:  91.400 cm/s LVOT VTI:    0.238 m  AORTA Ao Root diam: 4.00 cm MITRAL VALVE                        TRICUSPID VALVE MV Area (PHT): 3.42 cm             TR Peak grad:   27.5 mmHg MV PHT:        64.38 msec           TR Vmax:        262.00 cm/s MV Decel Time: 222 msec MV E velocity: 65.10 cm/s 103 cm/s  SHUNTS MV A velocity: 50.60 cm/s 70.3 cm/s Systemic VTI:  0.24 m MV E/A ratio:  1.29       1.5       Systemic Diam: 2.20 cm  Lyman Bishop MD Electronically signed by Lyman Bishop MD Signature Date/Time: 10/09/2019/4:09:07 PM    Final

## 2019-10-28 ENCOUNTER — Ambulatory Visit (INDEPENDENT_AMBULATORY_CARE_PROVIDER_SITE_OTHER): Payer: HMO | Admitting: Internal Medicine

## 2019-10-28 VITALS — BP 214/102

## 2019-10-28 DIAGNOSIS — J9601 Acute respiratory failure with hypoxia: Secondary | ICD-10-CM

## 2019-10-28 DIAGNOSIS — E1159 Type 2 diabetes mellitus with other circulatory complications: Secondary | ICD-10-CM

## 2019-10-28 DIAGNOSIS — E1169 Type 2 diabetes mellitus with other specified complication: Secondary | ICD-10-CM

## 2019-10-28 DIAGNOSIS — I10 Essential (primary) hypertension: Secondary | ICD-10-CM | POA: Diagnosis not present

## 2019-10-28 DIAGNOSIS — I5021 Acute systolic (congestive) heart failure: Secondary | ICD-10-CM

## 2019-10-28 NOTE — H&P (View-Only) (Signed)
Cardiology Office Note    Date:  10/29/2019   ID:  [name], DOB 24-May-1941, MRN [id number]  PCP:  Colon Branch, MD  Cardiologist:  Dr. Acie Fredrickson Chief Complaint: Hospital follow up   History of Present Illness:   [name] [name] is a 79 y.o. male  presents for hospital follow up.  Followed by Dr. Acie Fredrickson of HTN, HLD and palpitations. Last seen 11/2015. Hx of DM.   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 Admitted 09/2019 with acute hypoxic respiratory failure due to new heart failure.  Echocardiogram showed EF of 45 to 50% with mild apical anterior wall motion abnormality is felt secondary to chemotherapy.  He developed AKI with diuresis.  Started on Coreg and losartan.  He was also treated for pneumonia moderate to large pleural effusion.  Elevated troponin felt secondary to demand ischemia.  Plan to consider ischemic evaluation only if becomes symptomatic.  Here today for follow-up.  He reports stable breathing since discharge.  He did not require any Lasix.  He was seen by PCP yesterday and restarted losartan for elevated blood pressure.  Renal function normal.  He reports systolic blood pressure in 140s at home.  His blood pressure was normal during oncology appointment 10/27/2019.  He denies orthopnea, PND, syncope, lower extremity edema or melena.  Compliant with his medication.   Past Medical History:  Diagnosis Date  . Arthritis   . Complication of anesthesia     had spinal with knee surgeries  -"heart rate too low" for general  . Detached retina, left    L eye normal vision, reports that he has had a retina procedure in a doctor's office at [facility]   . Diabetes mellitus without complication ([place])   . Dysrhythmia    slight irregular rate  . GERD (gastroesophageal reflux disease)    hx no problems now  . H/O echocardiogram 2014  . History of hiatal hernia    ?20 yrs ago  . Hyperopia 2016  .  Hypertension   . Lumbar stenosis with neurogenic claudication   . MG, ocular (myasthenia gravis) ([place])    ? of L eye,   . Prediabetes   . Presbyopia OU 2016    Past Surgical History:  Procedure Laterality Date  . COLONOSCOPY    . HERNIA REPAIR     bilateral inguinal hernia  . HERNIA REPAIR     umbilical  . IR IMAGING GUIDED PORT INSERTION  09/16/2019  . JOINT REPLACEMENT  2009   right knee  . LUMBAR LAMINECTOMY/DECOMPRESSION MICRODISCECTOMY N/A 06/06/2017   Procedure: Decompression L3-5, insitu fusion L3-5 ;  Surgeon: Melina Schools, MD;  Location: [place];  Service: Orthopedics;  Laterality: N/A;  . LYMPH NODE BIOPSY Right 09/01/2019   Procedure: CERVICAL LYMPH NODE BIOPSY;  Surgeon: [name], MD;  Location: [place];  Service: ENT;  Laterality: Right;  . MIDDLE EAR SURGERY Right    ear -patched hole in ear drum  . TOTAL KNEE ARTHROPLASTY  09/10/2012   Procedure: TOTAL KNEE ARTHROPLASTY;  Surgeon: Tobi Bastos, MD;  Location: [facility];  Service: Orthopedics;  Laterality: Left;  . TOTAL SHOULDER ARTHROPLASTY Right 06/22/2016   Procedure: RIGHT TOTAL SHOULDER ARTHROPLASTY;  Surgeon: [name], MD;  Location: [place];  Service: Orthopedics;  Laterality: Right;    Current Medications: Prior to Admission medications   Medication Sig Start Date End Date Taking? Authorizing Provider  allopurinol (ZYLOPRIM) 300 MG  tablet Take 1 tablet (300 mg total) by mouth daily. 09/30/19   Tish Men, MD  aspirin 81 MG tablet Take 81 mg by mouth daily.    [provider]  carvedilol (COREG) 12.5 MG tablet Take 1 tablet (12.5 mg total) by mouth 2 (two) times daily with a meal. 06/20/19   Colon Branch, MD  furosemide (LASIX) 40 MG tablet Take 1 tablet (40 mg total) by mouth daily as needed for fluid or edema. 10/20/19 10/19/20  Kathie Dike, MD  HYDROcodone-acetaminophen (NORCO) 7.5-325 MG tablet Take 1 tablet by mouth 3 (three) times daily as needed for moderate pain. Patient not taking:  Reported on 10/28/2019 10/08/19   Colon Branch, MD  LORazepam (ATIVAN) 0.5 MG tablet Take 1 tablet (0.5 mg total) by mouth every 6 (six) hours as needed (Nausea or vomiting). 10/08/19   Tish Men, MD  losartan (COZAAR) 100 MG tablet Take 1 tablet (100 mg total) by mouth daily. 05/15/19   Colon Branch, MD  metFORMIN (GLUCOPHAGE) 500 MG tablet Take 1 tablet (500 mg total) by mouth daily with breakfast. 10/07/19   Colon Branch, MD  Nutritional Supplements (GLUCERNA 1.0 CAL/CARBSTEADY) LIQD Take 1 Can by mouth daily. Okay to drink up to 3 cans/bottles daily to replace meals as needed. Patient not taking: Reported on 10/09/2019 09/23/19   Colon Branch, MD  ondansetron (ZOFRAN) 8 MG tablet Take 1 tablet (8 mg total) by mouth 2 (two) times daily as needed for refractory nausea / vomiting. Start on day 3 after cyclophosphamide chemotherapy. 09/30/19   Tish Men, MD  potassium chloride (KLOR-CON M10) 10 MEQ tablet Take 1 tablet (10 mEq total) by mouth daily. 05/15/19   Colon Branch, MD  predniSONE (DELTASONE) 20 MG tablet TAKE 5 TABLETS (100 MG TOTAL) BY MOUTH DAILY FOR 5 DAYS. TAKE ON DAYS 1 5 OF CHEMOTHERAPY. 10/23/19   [provider]  prochlorperazine (COMPAZINE) 10 MG tablet Take 1 tablet (10 mg total) by mouth every 6 (six) hours as needed (Nausea or vomiting). Patient not taking: Reported on 10/28/2019 09/30/19   Tish Men, MD  simvastatin (ZOCOR) 80 MG tablet Take 0.5 tablets (40 mg total) by mouth daily. 10/07/18   Colon Branch, MD  traMADol (ULTRAM) 50 MG tablet Take 50 mg by mouth every 6 (six) hours as needed. 10/23/19   [provider]    Allergies:   Doxycycline   Social History   Socioeconomic History  . Marital status: Married    Spouse name: Not on file  . Number of children: 4  . Years of education: Not on file  . Highest education level: Not on file  Occupational History  . Occupation: Audiological scientist-- retired 2010  Tobacco Use  . Smoking status: Former Smoker    Packs/day:  0.50    Years: 2.00    Pack years: 1.00    Types: Cigarettes    Quit date: 06/09/1966    Years since quitting: 53.4  . Smokeless tobacco: Never Used  Substance and Sexual Activity  . Alcohol use: No    Alcohol/week: 0.0 standard drinks    Comment: none  . Drug use: No  . Sexual activity: Yes    Partners: Female  Other Topics Concern  . Not on file  Social History Narrative   married, lives w/ second wife, 2 living children, lost 2 kids     Has a small dog.     Social Determinants of Health  Financial Resource Strain:   . Difficulty of Paying Living Expenses: Not on file  Food Insecurity:   . Worried About Charity fundraiser in the Last Year: Not on file  . Ran Out of Food in the Last Year: Not on file  Transportation Needs:   . Lack of Transportation (Medical): Not on file  . Lack of Transportation (Non-Medical): Not on file  Physical Activity:   . Days of Exercise per Week: Not on file  . Minutes of Exercise per Session: Not on file  Stress:   . Feeling of Stress : Not on file  Social Connections:   . Frequency of Communication with Friends and Family: Not on file  . Frequency of Social Gatherings with Friends and Family: Not on file  . Attends Religious Services: Not on file  . Active Member of Clubs or Organizations: Not on file  . Attends Archivist Meetings: Not on file  . Marital Status: Not on file     Family History:  The patient's family history includes Colon cancer (age of onset: 73) in his brother; Diabetes in his brother; Hypertension in his sister.   ROS:   Please see the history of present illness.    ROS All other systems reviewed and are negative.   PHYSICAL EXAM:   VS:  BP (!) 148/60   Pulse 74   Ht 6' (1.829 m)   Wt 264 lb (119.7 kg)   SpO2 98%   BMI 35.80 kg/m    GEN: Well nourished, well developed, in no acute distress  HEENT: normal  Neck: no JVD, carotid bruits, or masses Cardiac: RRR; no murmurs, rubs, or gallops,no  edema  Respiratory:  clear to auscultation bilaterally, normal work of breathing GI: soft, nontender, nondistended, + BS MS: no deformity or atrophy  Skin: warm and dry, no rash Neuro:  Alert and Oriented x 3, Strength and sensation are intact Psych: euthymic mood, full affect  Wt Readings from Last 3 Encounters:  10/29/19 264 lb (119.7 kg)  10/20/19 264 lb 12.4 oz (120.1 kg)  09/23/19 (!) 304 lb 8 oz (138.1 kg)      Studies/Labs Reviewed:   EKG:  EKG is not ordered today.   Recent Labs: 09/23/2019: Pro B Natriuretic peptide (BNP) 115.0; TSH 2.07 10/12/2019: Magnesium 2.2 10/16/2019: B Natriuretic Peptide 767.1 10/27/2019: ALT 41; BUN 13; Creatinine 0.74; Hemoglobin 9.4; Platelet Count 576; Potassium 3.9; Sodium 133   Lipid Panel    Component Value Date/Time   CHOL 88 10/14/2019 0526   TRIG 122 10/14/2019 0526   TRIG 247 (HH) 08/30/2006 1117   HDL 25 (L) 10/14/2019 0526   CHOLHDL 3.5 10/14/2019 0526   VLDL 24 10/14/2019 0526   LDLCALC 39 10/14/2019 0526   LDLDIRECT 96.0 10/21/2018 1401    Additional studies/ records that were reviewed today include:   Echocardiogram: 10/09/19 . Left ventricular ejection fraction, by visual estimation, is 45 to 50%. The left ventricle has mildly decreased function. There is no left ventricular hypertrophy.  2. Mild hypokinesis of the left ventricular, mid-apical anterior wall and apical segment.  3. Definity contrast agent was given IV to delineate the left ventricular endocardial borders.  4. Left ventricular diastolic parameters are consistent with Grade I diastolic dysfunction (impaired relaxation).  5. Mild to moderately dilated left ventricular internal cavity size.  6. The left ventricle demonstrates regional wall motion abnormalities.  7. Global right ventricle has low normal systolic function.The right ventricular size is normal. No  increase in right ventricular wall thickness.  8. Left atrial size was normal.  9. Right atrial  size was normal. 10. The mitral valve is grossly normal. Trivial mitral valve regurgitation. 11. The tricuspid valve is grossly normal. Tricuspid valve regurgitation is mild. 12. The aortic valve is tricuspid. Aortic valve regurgitation is trivial. Mild aortic valve sclerosis without stenosis. 13. The pulmonic valve was grossly normal. Pulmonic valve regurgitation is not visualized. 14. Aortic dilatation noted. 15. 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 16. Mildly elevated pulmonary artery systolic pressure. 17. The inferior vena cava is dilated in size with >50% respiratory variability, suggesting right atrial pressure of 8 mmHg.  Echo 09/12/19   1. Left ventricular ejection fraction, by visual estimation, is 60 to 65%. The left ventricle has normal function. There is moderately increased left ventricular hypertrophy.  2. Definity contrast agent was given IV to delineate the left ventricular endocardial borders.  3. Left ventricular diastolic parameters are consistent with Grade I diastolic dysfunction (impaired relaxation).  4. The tricuspid valve is normal in structure. Tricuspid valve regurgitation is trivial.  5. The aortic valve is normal in structure. Aortic valve regurgitation is trivial. No evidence of aortic valve sclerosis or stenosis.  6. There is mild dilatation of the ascending aorta measuring 39 mm.   ASSESSMENT & PLAN:    1. Chronic systolic heart failure Echocardiogram with LV function of 45 to 50% with new  mid apical anterior wall motion abnormality.  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  No plan for ischemic evaluation until symptomatic.  Euvolemic by exam.  Continue as needed Lasix.  Continue carvedilol and losartan at current dose.  2.  Hypertensive heart disease -Blood pressure of 148/60 today.  However, he did not took his medication this morning.  Continue current dose of Coreg and losartan.  His blood pressure has been followed by  PCP.  3.  AKI -Resolved.  Reviewed blood work done by PCP yesterday.  4.  Stage IV T-cell lymphoma -Concern for chemo induced cardiotoxicity.  Followed by oncology  5.  Mild dilation of ascending aorta -Follow-up with routine echo.  6. HLD - on statin   Medication Adjustments/Labs and Tests Ordered: Current medicines are reviewed at length with the patient today.  Concerns regarding medicines are outlined above.  Medication changes, Labs and Tests ordered today are listed in the Patient Instructions below. Patient Instructions  Medication Instructions:  *If you need a refill on your cardiac medications before your next appointment, please call your pharmacy*  Lab Work:  If you have labs (blood work) drawn today and your tests are completely normal, you will receive your results only by: Marland Kitchen MyChart Message (if you have MyChart) OR . A paper copy in the mail If you have any lab test that is abnormal or we need to change your treatment, we will call you to review the results.  Testing/Procedures: None ordered at this time.  Follow-Up: At [facility], you and your health needs are our priority.  As part of our continuing mission to provide you with exceptional heart care, we have created designated Provider Care Teams.  These Care Teams include your primary Cardiologist (physician) and Advanced Practice Providers (APPs -  Physician Assistants and Nurse Practitioners) who all work together to provide you with the care you need, when you need it.  Your next appointment:   3 month(s)  The format for your next appointment:   Either In Person or Virtual  Provider:  You may see Mertie Moores, MD or one of the following Advanced Practice Providers on your designated Care Team:    Richardson Dopp, PA-C  Vin Grenloch, PA-C  Daune Perch, NP      [place], [place]  10/29/2019 11:03 AM    [address], [place], [place]  [address]  Phone: [phone]; Fax: [phone]

## 2019-10-28 NOTE — Progress Notes (Signed)
Subjective:    Patient ID: [name] [name], male    DOB: Mar 16, 1941, 79 y.o.   MRN: [id number]  DOS:  10/28/2019 Type of visit - description: Virtual Visit via Video Note  I connected with the above patient  by a video enabled telemedicine application and verified that I am speaking with the correct person using two identifiers.   THIS ENCOUNTER IS A VIRTUAL VISIT DUE TO COVID-19 - PATIENT WAS NOT SEEN IN THE OFFICE. PATIENT HAS CONSENTED TO VIRTUAL VISIT / TELEMEDICINE VISIT   Location of patient: home  Location of provider: office  I discussed the limitations of evaluation and management by telemedicine and the availability of in person appointments. The patient expressed understanding and agreed to proceed.  History of Present Illness: Hospital follow-up, TCM  The patient was admitted to the hospital and discharged 10/20/2019. 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 He was diagnosed with acute respiratory failure with hypoxia and new onset CHF.  Since he left the hospital he is feeling well. Has seen his oncologist and is undergoing treatment. He has an appointment to see the cardiologist soon.  Yesterday his blood pressure was elevated and today was 214/102. He was holding losartan since he left the hospital but got case first dose today.   BP Readings from Last 3 Encounters:  10/29/19 (!) 148/60  10/28/19 (!) 214/102  10/27/19 128/73     Review of Systems Denies fever chills Appetite is improving Does not monitor his weight at home Denies nausea or vomiting.  No diarrhea No swelling DOE much improved   Past Medical History:  Diagnosis Date  . Arthritis   . Complication of anesthesia     had spinal with knee surgeries  -"heart rate too low" for general  . Detached retina, left    L eye normal vision, reports that he has had a retina procedure in a doctor's office at [facility]   . Diabetes  mellitus without complication ([place])   . Dysrhythmia    slight irregular rate  . GERD (gastroesophageal reflux disease)    hx no problems now  . H/O echocardiogram 2014  . History of hiatal hernia    ?20 yrs ago  . Hyperopia 2016  . Hypertension   . Lumbar stenosis with neurogenic claudication   . MG, ocular (myasthenia gravis) ([place])    ? of L eye,   . Prediabetes   . Presbyopia OU 2016    Past Surgical History:  Procedure Laterality Date  . COLONOSCOPY    . HERNIA REPAIR     bilateral inguinal hernia  . HERNIA REPAIR     umbilical  . IR IMAGING GUIDED PORT INSERTION  09/16/2019  . JOINT REPLACEMENT  2009   right knee  . LUMBAR LAMINECTOMY/DECOMPRESSION MICRODISCECTOMY N/A 06/06/2017   Procedure: Decompression L3-5, insitu fusion L3-5 ;  Surgeon: Melina Schools, MD;  Location: [place];  Service: Orthopedics;  Laterality: N/A;  . LYMPH NODE BIOPSY Right 09/01/2019   Procedure: CERVICAL LYMPH NODE BIOPSY;  Surgeon: [name], MD;  Location: [place];  Service: ENT;  Laterality: Right;  . MIDDLE EAR SURGERY Right    ear -patched hole in ear drum  . TOTAL KNEE ARTHROPLASTY  09/10/2012   Procedure: TOTAL KNEE ARTHROPLASTY;  Surgeon: Tobi Bastos, MD;  [facility];  Service: Orthopedics;  Laterality: Left;  . TOTAL SHOULDER ARTHROPLASTY Right 06/22/2016   Procedure: RIGHT TOTAL SHOULDER  ARTHROPLASTY;  Surgeon: [name], MD;  Location: [place];  Service: Orthopedics;  Laterality: Right;    Social History   Socioeconomic History  . Marital status: Married    Spouse name: Not on file  . Number of children: 4  . Years of education: Not on file  . Highest education level: Not on file  Occupational History  . Occupation: Audiological scientist-- retired 2010  Tobacco Use  . Smoking status: Former Smoker    Packs/day: 0.50    Years: 2.00    Pack years: 1.00    Types: Cigarettes    Quit date: 06/09/1966    Years since quitting: 53.4  . Smokeless tobacco: Never Used    Substance and Sexual Activity  . Alcohol use: No    Alcohol/week: 0.0 standard drinks    Comment: none  . Drug use: No  . Sexual activity: Yes    Partners: Female  Other Topics Concern  . Not on file  Social History Narrative   married, lives w/ second wife, 2 living children, lost 2 kids     Has a small dog.     Social Determinants of Health   Financial Resource Strain:   . Difficulty of Paying Living Expenses: Not on file  Food Insecurity:   . Worried About Charity fundraiser in the Last Year: Not on file  . Ran Out of Food in the Last Year: Not on file  Transportation Needs:   . Lack of Transportation (Medical): Not on file  . Lack of Transportation (Non-Medical): Not on file  Physical Activity:   . Days of Exercise per Week: Not on file  . Minutes of Exercise per Session: Not on file  Stress:   . Feeling of Stress : Not on file  Social Connections:   . Frequency of Communication with Friends and Family: Not on file  . Frequency of Social Gatherings with Friends and Family: Not on file  . Attends Religious Services: Not on file  . Active Member of Clubs or Organizations: Not on file  . Attends Archivist Meetings: Not on file  . Marital Status: Not on file  Intimate Partner Violence:   . Fear of Current or Ex-Partner: Not on file  . Emotionally Abused: Not on file  . Physically Abused: Not on file  . Sexually Abused: Not on file      Allergies as of 10/28/2019      Reactions   Doxycycline Rash      Medication List       Accurate as of October 28, 2019 11:59 PM. If you have any questions, ask your nurse or doctor.        STOP taking these medications   acetaminophen 500 MG tablet Commonly known as: TYLENOL   Alka-Seltzer 325 MG Tbef tablet Generic drug: aspirin-sod bicarb-citric acid   loperamide 2 MG capsule Commonly known as: IMODIUM   protein supplement shake Liqd Commonly known as: PREMIER PROTEIN     TAKE these medications    allopurinol 300 MG tablet Commonly known as: ZYLOPRIM Take 1 tablet (300 mg total) by mouth daily.   aspirin 81 MG tablet Take 81 mg by mouth daily.   carvedilol 12.5 MG tablet Commonly known as: COREG Take 1 tablet (12.5 mg total) by mouth 2 (two) times daily with a meal.   furosemide 40 MG tablet Commonly known as: Lasix Take 1 tablet (40 mg total) by mouth daily as needed for fluid or edema.  Glucerna 1.0 Cal/CarbSteady Liqd Take 1 Can by mouth daily. Okay to drink up to 3 cans/bottles daily to replace meals as needed.   HYDROcodone-acetaminophen 7.5-325 MG tablet Commonly known as: NORCO Take 1 tablet by mouth 3 (three) times daily as needed for moderate pain.   LORazepam 0.5 MG tablet Commonly known as: Ativan Take 1 tablet (0.5 mg total) by mouth every 6 (six) hours as needed (Nausea or vomiting).   losartan 100 MG tablet Commonly known as: COZAAR Take 1 tablet (100 mg total) by mouth daily.   metFORMIN 500 MG tablet Commonly known as: GLUCOPHAGE Take 1 tablet (500 mg total) by mouth daily with breakfast.   ondansetron 8 MG tablet Commonly known as: Zofran Take 1 tablet (8 mg total) by mouth 2 (two) times daily as needed for refractory nausea / vomiting. Start on day 3 after cyclophosphamide chemotherapy.   potassium chloride 10 MEQ tablet Commonly known as: Klor-Con M10 Take 1 tablet (10 mEq total) by mouth daily.   predniSONE 20 MG tablet Commonly known as: DELTASONE TAKE 5 TABLETS (100 MG TOTAL) BY MOUTH DAILY FOR 5 DAYS. TAKE ON DAYS 1 5 OF CHEMOTHERAPY.   prochlorperazine 10 MG tablet Commonly known as: COMPAZINE Take 1 tablet (10 mg total) by mouth every 6 (six) hours as needed (Nausea or vomiting).   simvastatin 80 MG tablet Commonly known as: ZOCOR Take 0.5 tablets (40 mg total) by mouth daily.   traMADol 50 MG tablet Commonly known as: ULTRAM Take 50 mg by mouth every 6 (six) hours as needed.           Objective:   Physical Exam BP  (!) 214/102  This is a virtual video visit, he is alert oriented x3, he does not seem in distress, does not seem to be tired    Assessment    Assessment DM HTN Hyperlipidemia Morbid obesity Snoring  Palpitations, PVCs, h/o bradycardia,s/p. Dr Adin [place] day monitor, echo 2014: Normal LV, some LVH due to HTN Myasthenia gravis (ocular, Dr Tomi Likens)  h/o detached retina before  Stasis dermatitis T-cell lymphoma, DX 08-2019   PLAN: Acute respiratory failure, new onset CHF: Admitted to the hospital and discharged few days ago.  Currently at home under the care of his wife and his daughter [name] who lives nearby. He was holding losartan but BPs have been increasing, today 214/102.  He consequently restarted losartan today.  He is also taking Lasix, potassium, carvedilol. Recommend to continue taking losartan daily alone the other medications, monitor BPs.  BMP needs to be checked within the next 2 weeks. HTN: As above DM: Well-controlled on Metformin Social: Lives at home with wife, daughter [name] lives nearby and visit him frequently throughout the day. RTC 3 months   I discussed the assessment and treatment plan with the patient. The patient was provided an opportunity to ask questions and all were answered. The patient agreed with the plan and demonstrated an understanding of the instructions.   The patient was advised to call back or seek an in-person evaluation if the symptoms worsen or if the condition fails to improve as anticipated.

## 2019-10-28 NOTE — Progress Notes (Signed)
Cardiology Office Note    Date:  10/29/2019   ID:  [name] [name], DOB 10-Jul-1941, MRN [id number]  PCP:  Colon Branch, MD  Cardiologist:  Dr. Acie Fredrickson Chief Complaint: Hospital follow up   History of Present Illness:   [name] [name] is a 79 y.o. male  presents for hospital follow up.  Followed by Dr. Acie Fredrickson of HTN, HLD and palpitations. Last seen 11/2015. Hx of DM.   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 Admitted 09/2019 with acute hypoxic respiratory failure due to new heart failure.  Echocardiogram showed EF of 45 to 50% with mild apical anterior wall motion abnormality is felt secondary to chemotherapy.  He developed AKI with diuresis.  Started on Coreg and losartan.  He was also treated for pneumonia moderate to large pleural effusion.  Elevated troponin felt secondary to demand ischemia.  Plan to consider ischemic evaluation only if becomes symptomatic.  Here today for follow-up.  He reports stable breathing since discharge.  He did not require any Lasix.  He was seen by PCP yesterday and restarted losartan for elevated blood pressure.  Renal function normal.  He reports systolic blood pressure in 140s at home.  His blood pressure was normal during oncology appointment 10/27/2019.  He denies orthopnea, PND, syncope, lower extremity edema or melena.  Compliant with his medication.   Past Medical History:  Diagnosis Date  . Arthritis   . Complication of anesthesia     had spinal with knee surgeries  -"heart rate too low" for general  . Detached retina, left    L eye normal vision, reports that he has had a retina procedure in a doctor's office at [facility]   . Diabetes mellitus without complication ([place])   . Dysrhythmia    slight irregular rate  . GERD (gastroesophageal reflux disease)    hx no problems now  . H/O echocardiogram 2014  . History of hiatal hernia    ?20 yrs ago  . Hyperopia 2016  .  Hypertension   . Lumbar stenosis with neurogenic claudication   . MG, ocular (myasthenia gravis) ([place])    ? of L eye,   . Prediabetes   . Presbyopia OU 2016    Past Surgical History:  Procedure Laterality Date  . COLONOSCOPY    . HERNIA REPAIR     bilateral inguinal hernia  . HERNIA REPAIR     umbilical  . IR IMAGING GUIDED PORT INSERTION  09/16/2019  . JOINT REPLACEMENT  2009   right knee  . LUMBAR LAMINECTOMY/DECOMPRESSION MICRODISCECTOMY N/A 06/06/2017   Procedure: Decompression L3-5, insitu fusion L3-5 ;  Surgeon: Melina Schools, MD;  Location: [place];  Service: Orthopedics;  Laterality: N/A;  . LYMPH NODE BIOPSY Right 09/01/2019   Procedure: CERVICAL LYMPH NODE BIOPSY;  Surgeon: [name], MD;  Location: [place];  Service: ENT;  Laterality: Right;  . MIDDLE EAR SURGERY Right    ear -patched hole in ear drum  . TOTAL KNEE ARTHROPLASTY  09/10/2012   Procedure: TOTAL KNEE ARTHROPLASTY;  Surgeon: Tobi Bastos, MD;  [facility];  Service: Orthopedics;  Laterality: Left;  . TOTAL SHOULDER ARTHROPLASTY Right 06/22/2016   Procedure: RIGHT TOTAL SHOULDER ARTHROPLASTY;  Surgeon: [name], MD;  Location: [place];  Service: Orthopedics;  Laterality: Right;    Current Medications: Prior to Admission medications   Medication Sig Start Date End Date Taking? Authorizing Provider  allopurinol (ZYLOPRIM) 300 MG  tablet Take 1 tablet (300 mg total) by mouth daily. 09/30/19   Tish Men, MD  aspirin 81 MG tablet Take 81 mg by mouth daily.    [provider]  carvedilol (COREG) 12.5 MG tablet Take 1 tablet (12.5 mg total) by mouth 2 (two) times daily with a meal. 06/20/19   Colon Branch, MD  furosemide (LASIX) 40 MG tablet Take 1 tablet (40 mg total) by mouth daily as needed for fluid or edema. 10/20/19 10/19/20  Kathie Dike, MD  HYDROcodone-acetaminophen (NORCO) 7.5-325 MG tablet Take 1 tablet by mouth 3 (three) times daily as needed for moderate pain. Patient not taking:  Reported on 10/28/2019 10/08/19   Colon Branch, MD  LORazepam (ATIVAN) 0.5 MG tablet Take 1 tablet (0.5 mg total) by mouth every 6 (six) hours as needed (Nausea or vomiting). 10/08/19   Tish Men, MD  losartan (COZAAR) 100 MG tablet Take 1 tablet (100 mg total) by mouth daily. 05/15/19   Colon Branch, MD  metFORMIN (GLUCOPHAGE) 500 MG tablet Take 1 tablet (500 mg total) by mouth daily with breakfast. 10/07/19   Colon Branch, MD  Nutritional Supplements (GLUCERNA 1.0 CAL/CARBSTEADY) LIQD Take 1 Can by mouth daily. Okay to drink up to 3 cans/bottles daily to replace meals as needed. Patient not taking: Reported on 10/09/2019 09/23/19   Colon Branch, MD  ondansetron (ZOFRAN) 8 MG tablet Take 1 tablet (8 mg total) by mouth 2 (two) times daily as needed for refractory nausea / vomiting. Start on day 3 after cyclophosphamide chemotherapy. 09/30/19   Tish Men, MD  potassium chloride (KLOR-CON M10) 10 MEQ tablet Take 1 tablet (10 mEq total) by mouth daily. 05/15/19   Colon Branch, MD  predniSONE (DELTASONE) 20 MG tablet TAKE 5 TABLETS (100 MG TOTAL) BY MOUTH DAILY FOR 5 DAYS. TAKE ON DAYS 1 5 OF CHEMOTHERAPY. 10/23/19   [provider]  prochlorperazine (COMPAZINE) 10 MG tablet Take 1 tablet (10 mg total) by mouth every 6 (six) hours as needed (Nausea or vomiting). Patient not taking: Reported on 10/28/2019 09/30/19   Tish Men, MD  simvastatin (ZOCOR) 80 MG tablet Take 0.5 tablets (40 mg total) by mouth daily. 10/07/18   Colon Branch, MD  traMADol (ULTRAM) 50 MG tablet Take 50 mg by mouth every 6 (six) hours as needed. 10/23/19   [provider]    Allergies:   Doxycycline   Social History   Socioeconomic History  . Marital status: Married    Spouse name: Not on file  . Number of children: 4  . Years of education: Not on file  . Highest education level: Not on file  Occupational History  . Occupation: Audiological scientist-- retired 2010  Tobacco Use  . Smoking status: Former Smoker    Packs/day:  0.50    Years: 2.00    Pack years: 1.00    Types: Cigarettes    Quit date: 06/09/1966    Years since quitting: 53.4  . Smokeless tobacco: Never Used  Substance and Sexual Activity  . Alcohol use: No    Alcohol/week: 0.0 standard drinks    Comment: none  . Drug use: No  . Sexual activity: Yes    Partners: Female  Other Topics Concern  . Not on file  Social History Narrative   married, lives w/ second wife, 2 living children, lost 2 kids     Has a small dog.     Social Determinants of Health  Financial Resource Strain:   . Difficulty of Paying Living Expenses: Not on file  Food Insecurity:   . Worried About Charity fundraiser in the Last Year: Not on file  . Ran Out of Food in the Last Year: Not on file  Transportation Needs:   . Lack of Transportation (Medical): Not on file  . Lack of Transportation (Non-Medical): Not on file  Physical Activity:   . Days of Exercise per Week: Not on file  . Minutes of Exercise per Session: Not on file  Stress:   . Feeling of Stress : Not on file  Social Connections:   . Frequency of Communication with Friends and Family: Not on file  . Frequency of Social Gatherings with Friends and Family: Not on file  . Attends Religious Services: Not on file  . Active Member of Clubs or Organizations: Not on file  . Attends Archivist Meetings: Not on file  . Marital Status: Not on file     Family History:  The patient's family history includes Colon cancer (age of onset: 35) in his brother; Diabetes in his brother; Hypertension in his sister.   ROS:   Please see the history of present illness.    ROS All other systems reviewed and are negative.   PHYSICAL EXAM:   VS:  BP (!) 148/60   Pulse 74   Ht 6' (1.829 m)   Wt 264 lb (119.7 kg)   SpO2 98%   BMI 35.80 kg/m    GEN: Well nourished, well developed, in no acute distress  HEENT: normal  Neck: no JVD, carotid bruits, or masses Cardiac: RRR; no murmurs, rubs, or gallops,no  edema  Respiratory:  clear to auscultation bilaterally, normal work of breathing GI: soft, nontender, nondistended, + BS MS: no deformity or atrophy  Skin: warm and dry, no rash Neuro:  Alert and Oriented x 3, Strength and sensation are intact Psych: euthymic mood, full affect  Wt Readings from Last 3 Encounters:  10/29/19 264 lb (119.7 kg)  10/20/19 264 lb 12.4 oz (120.1 kg)  09/23/19 (!) 304 lb 8 oz (138.1 kg)      Studies/Labs Reviewed:   EKG:  EKG is not ordered today.   Recent Labs: 09/23/2019: Pro B Natriuretic peptide (BNP) 115.0; TSH 2.07 10/12/2019: Magnesium 2.2 10/16/2019: B Natriuretic Peptide 767.1 10/27/2019: ALT 41; BUN 13; Creatinine 0.74; Hemoglobin 9.4; Platelet Count 576; Potassium 3.9; Sodium 133   Lipid Panel    Component Value Date/Time   CHOL 88 10/14/2019 0526   TRIG 122 10/14/2019 0526   TRIG 247 (HH) 08/30/2006 1117   HDL 25 (L) 10/14/2019 0526   CHOLHDL 3.5 10/14/2019 0526   VLDL 24 10/14/2019 0526   LDLCALC 39 10/14/2019 0526   LDLDIRECT 96.0 10/21/2018 1401    Additional studies/ records that were reviewed today include:   Echocardiogram: 10/09/19 . Left ventricular ejection fraction, by visual estimation, is 45 to 50%. The left ventricle has mildly decreased function. There is no left ventricular hypertrophy.  2. Mild hypokinesis of the left ventricular, mid-apical anterior wall and apical segment.  3. Definity contrast agent was given IV to delineate the left ventricular endocardial borders.  4. Left ventricular diastolic parameters are consistent with Grade I diastolic dysfunction (impaired relaxation).  5. Mild to moderately dilated left ventricular internal cavity size.  6. The left ventricle demonstrates regional wall motion abnormalities.  7. Global right ventricle has low normal systolic function.The right ventricular size is normal. No  increase in right ventricular wall thickness.  8. Left atrial size was normal.  9. Right atrial  size was normal. 10. The mitral valve is grossly normal. Trivial mitral valve regurgitation. 11. The tricuspid valve is grossly normal. Tricuspid valve regurgitation is mild. 12. The aortic valve is tricuspid. Aortic valve regurgitation is trivial. Mild aortic valve sclerosis without stenosis. 13. The pulmonic valve was grossly normal. Pulmonic valve regurgitation is not visualized. 14. Aortic dilatation noted. 15. 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 16. Mildly elevated pulmonary artery systolic pressure. 17. The inferior vena cava is dilated in size with >50% respiratory variability, suggesting right atrial pressure of 8 mmHg.  Echo 09/12/19   1. Left ventricular ejection fraction, by visual estimation, is 60 to 65%. The left ventricle has normal function. There is moderately increased left ventricular hypertrophy.  2. Definity contrast agent was given IV to delineate the left ventricular endocardial borders.  3. Left ventricular diastolic parameters are consistent with Grade I diastolic dysfunction (impaired relaxation).  4. The tricuspid valve is normal in structure. Tricuspid valve regurgitation is trivial.  5. The aortic valve is normal in structure. Aortic valve regurgitation is trivial. No evidence of aortic valve sclerosis or stenosis.  6. There is mild dilatation of the ascending aorta measuring 39 mm.   ASSESSMENT & PLAN:    1. Chronic systolic heart failure Echocardiogram with LV function of 45 to 50% with new  mid apical anterior wall motion abnormality.  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  No plan for ischemic evaluation until symptomatic.  Euvolemic by exam.  Continue as needed Lasix.  Continue carvedilol and losartan at current dose.  2.  Hypertensive heart disease -Blood pressure of 148/60 today.  However, he did not took his medication this morning.  Continue current dose of Coreg and losartan.  His blood pressure has been followed by  PCP.  3.  AKI -Resolved.  Reviewed blood work done by PCP yesterday.  4.  Stage IV T-cell lymphoma -Concern for chemo induced cardiotoxicity.  Followed by oncology  5.  Mild dilation of ascending aorta -Follow-up with routine echo.  6. HLD - on statin   Medication Adjustments/Labs and Tests Ordered: Current medicines are reviewed at length with the patient today.  Concerns regarding medicines are outlined above.  Medication changes, Labs and Tests ordered today are listed in the Patient Instructions below. Patient Instructions  Medication Instructions:  *If you need a refill on your cardiac medications before your next appointment, please call your pharmacy*  Lab Work:  If you have labs (blood work) drawn today and your tests are completely normal, you will receive your results only by: Marland Kitchen MyChart Message (if you have MyChart) OR . A paper copy in the mail If you have any lab test that is abnormal or we need to change your treatment, we will call you to review the results.  Testing/Procedures: None ordered at this time.  Follow-Up: At [facility], you and your health needs are our priority.  As part of our continuing mission to provide you with exceptional heart care, we have created designated Provider Care Teams.  These Care Teams include your primary Cardiologist (physician) and Advanced Practice Providers (APPs -  Physician Assistants and Nurse Practitioners) who all work together to provide you with the care you need, when you need it.  Your next appointment:   3 month(s)  The format for your next appointment:   Either In Person or Virtual  Provider:  You may see Mertie Moores, MD or one of the following Advanced Practice Providers on your designated Care Team:    Richardson Dopp, PA-C  Vin Moscow, PA-C  Daune Perch, NP      [place], [place]  10/29/2019 11:03 AM    [address], [place], [place]  [address]  Phone: [phone]; Fax: [phone]

## 2019-10-29 ENCOUNTER — Ambulatory Visit (INDEPENDENT_AMBULATORY_CARE_PROVIDER_SITE_OTHER): Payer: HMO | Admitting: Physician Assistant

## 2019-10-29 ENCOUNTER — Encounter: Payer: Self-pay | Admitting: Physician Assistant

## 2019-10-29 VITALS — BP 148/60 | HR 74 | Ht 72.0 in | Wt 264.0 lb

## 2019-10-29 DIAGNOSIS — I77810 Thoracic aortic ectasia: Secondary | ICD-10-CM | POA: Diagnosis not present

## 2019-10-29 DIAGNOSIS — E782 Mixed hyperlipidemia: Secondary | ICD-10-CM

## 2019-10-29 DIAGNOSIS — I5022 Chronic systolic (congestive) heart failure: Secondary | ICD-10-CM

## 2019-10-29 DIAGNOSIS — I110 Hypertensive heart disease with heart failure: Secondary | ICD-10-CM

## 2019-10-29 DIAGNOSIS — C8590 Non-Hodgkin lymphoma, unspecified, unspecified site: Secondary | ICD-10-CM

## 2019-10-29 NOTE — Patient Instructions (Signed)
Medication Instructions:  *If you need a refill on your cardiac medications before your next appointment, please call your pharmacy*  Lab Work:  If you have labs (blood work) drawn today and your tests are completely normal, you will receive your results only by: Marland Kitchen MyChart Message (if you have MyChart) OR . A paper copy in the mail If you have any lab test that is abnormal or we need to change your treatment, we will call you to review the results.  Testing/Procedures: None ordered at this time.  Follow-Up: At [facility], you and your health needs are our priority.  As part of our continuing mission to provide you with exceptional heart care, we have created designated Provider Care Teams.  These Care Teams include your primary Cardiologist (physician) and Advanced Practice Providers (APPs -  Physician Assistants and Nurse Practitioners) who all work together to provide you with the care you need, when you need it.  Your next appointment:   3 month(s)  The format for your next appointment:   Either In Person or Virtual  Provider:   You may see Mertie Moores, MD or one of the following Advanced Practice Providers on your designated Care Team:    Richardson Dopp, PA-C  [place], [place]  Daune Perch, [place]

## 2019-10-29 NOTE — Assessment & Plan Note (Signed)
Acute respiratory failure, new onset CHF: Admitted to the hospital and discharged few days ago.  Currently at home under the care of his wife and his daughter [name] who lives nearby. He was holding losartan but BPs have been increasing, today 214/102.  He consequently restarted losartan today.  He is also taking Lasix, potassium, carvedilol. Recommend to continue taking losartan daily alone the other medications, monitor BPs.  BMP needs to be checked within the next 2 weeks. HTN: As above DM: Well-controlled on Metformin Social: Lives at home with wife, daughter [name] lives nearby and visit him frequently throughout the day. RTC 3 months

## 2019-10-30 ENCOUNTER — Ambulatory Visit: Payer: HMO | Admitting: Cardiovascular Disease

## 2019-10-31 DIAGNOSIS — Z96611 Presence of right artificial shoulder joint: Secondary | ICD-10-CM | POA: Diagnosis not present

## 2019-10-31 DIAGNOSIS — I5020 Unspecified systolic (congestive) heart failure: Secondary | ICD-10-CM | POA: Diagnosis not present

## 2019-10-31 DIAGNOSIS — Z9181 History of falling: Secondary | ICD-10-CM | POA: Diagnosis not present

## 2019-10-31 DIAGNOSIS — Z7984 Long term (current) use of oral hypoglycemic drugs: Secondary | ICD-10-CM | POA: Diagnosis not present

## 2019-10-31 DIAGNOSIS — E669 Obesity, unspecified: Secondary | ICD-10-CM | POA: Diagnosis not present

## 2019-10-31 DIAGNOSIS — Z7982 Long term (current) use of aspirin: Secondary | ICD-10-CM | POA: Diagnosis not present

## 2019-10-31 DIAGNOSIS — M1990 Unspecified osteoarthritis, unspecified site: Secondary | ICD-10-CM | POA: Diagnosis not present

## 2019-10-31 DIAGNOSIS — E119 Type 2 diabetes mellitus without complications: Secondary | ICD-10-CM | POA: Diagnosis not present

## 2019-10-31 DIAGNOSIS — J9691 Respiratory failure, unspecified with hypoxia: Secondary | ICD-10-CM | POA: Diagnosis not present

## 2019-10-31 DIAGNOSIS — C8440 Peripheral T-cell lymphoma, not classified, unspecified site: Secondary | ICD-10-CM | POA: Diagnosis not present

## 2019-10-31 DIAGNOSIS — M48061 Spinal stenosis, lumbar region without neurogenic claudication: Secondary | ICD-10-CM | POA: Diagnosis not present

## 2019-10-31 DIAGNOSIS — D630 Anemia in neoplastic disease: Secondary | ICD-10-CM | POA: Diagnosis not present

## 2019-10-31 DIAGNOSIS — I110 Hypertensive heart disease with heart failure: Secondary | ICD-10-CM | POA: Diagnosis not present

## 2019-11-05 ENCOUNTER — Other Ambulatory Visit: Payer: Self-pay

## 2019-11-05 DIAGNOSIS — E119 Type 2 diabetes mellitus without complications: Secondary | ICD-10-CM | POA: Diagnosis not present

## 2019-11-05 DIAGNOSIS — Z9181 History of falling: Secondary | ICD-10-CM

## 2019-11-05 DIAGNOSIS — I5020 Unspecified systolic (congestive) heart failure: Secondary | ICD-10-CM | POA: Diagnosis not present

## 2019-11-05 DIAGNOSIS — J9691 Respiratory failure, unspecified with hypoxia: Secondary | ICD-10-CM | POA: Diagnosis not present

## 2019-11-05 DIAGNOSIS — M48061 Spinal stenosis, lumbar region without neurogenic claudication: Secondary | ICD-10-CM | POA: Diagnosis not present

## 2019-11-05 DIAGNOSIS — Z7984 Long term (current) use of oral hypoglycemic drugs: Secondary | ICD-10-CM | POA: Diagnosis not present

## 2019-11-05 DIAGNOSIS — D630 Anemia in neoplastic disease: Secondary | ICD-10-CM | POA: Diagnosis not present

## 2019-11-05 DIAGNOSIS — I110 Hypertensive heart disease with heart failure: Secondary | ICD-10-CM | POA: Diagnosis not present

## 2019-11-05 DIAGNOSIS — C8440 Peripheral T-cell lymphoma, not classified, unspecified site: Secondary | ICD-10-CM | POA: Diagnosis not present

## 2019-11-05 DIAGNOSIS — M1990 Unspecified osteoarthritis, unspecified site: Secondary | ICD-10-CM | POA: Diagnosis not present

## 2019-11-05 DIAGNOSIS — E669 Obesity, unspecified: Secondary | ICD-10-CM | POA: Diagnosis not present

## 2019-11-05 DIAGNOSIS — Z7982 Long term (current) use of aspirin: Secondary | ICD-10-CM | POA: Diagnosis not present

## 2019-11-05 DIAGNOSIS — Z96611 Presence of right artificial shoulder joint: Secondary | ICD-10-CM | POA: Diagnosis not present

## 2019-11-05 NOTE — Patient Outreach (Signed)
  [facility]) Care Management Chronic Special Needs Program   11/05/2019  Name: [name] [name], DOB: 09-Feb-1941  MRN: [id number]  The client was discussed in today's interdisciplinary care team meeting.  The following issues were discussed:  Client's needs, Changes in health status, Care Plan, Coordination of care and Care transitions   Participants present:                  Thea Silversmith, MSN, RN, CCM                     Melissa Sandlin RN,BSN,CCM, CDE  Kelli Churn, RN, CCM, CDE Quinn Plowman RN, BSN, CCM            Maryella Shivers, MD            Bary Castilla, RN, BSN, MS, CCM Coralie Carpen, MD  Recommendations: review medications for SGLT2  Plan: RNCM will continue to follow.  Thea Silversmith, RN, MSN, [place] [phone]

## 2019-11-06 ENCOUNTER — Other Ambulatory Visit: Payer: HMO

## 2019-11-06 ENCOUNTER — Ambulatory Visit: Payer: HMO | Admitting: Hematology

## 2019-11-07 ENCOUNTER — Inpatient Hospital Stay: Payer: HMO

## 2019-11-07 ENCOUNTER — Telehealth: Payer: Self-pay | Admitting: Physician Assistant

## 2019-11-07 ENCOUNTER — Telehealth: Payer: Self-pay | Admitting: Hematology

## 2019-11-07 ENCOUNTER — Encounter: Payer: Self-pay | Admitting: Hematology

## 2019-11-07 ENCOUNTER — Other Ambulatory Visit: Payer: Self-pay | Admitting: Hematology

## 2019-11-07 ENCOUNTER — Ambulatory Visit (HOSPITAL_BASED_OUTPATIENT_CLINIC_OR_DEPARTMENT_OTHER)
Admission: RE | Admit: 2019-11-07 | Discharge: 2019-11-07 | Disposition: A | Discharge status: Home / Self Care | Payer: HMO | Source: Ambulatory Visit | Attending: Hematology | Admitting: Hematology

## 2019-11-07 ENCOUNTER — Other Ambulatory Visit: Payer: Self-pay

## 2019-11-07 ENCOUNTER — Inpatient Hospital Stay (HOSPITAL_BASED_OUTPATIENT_CLINIC_OR_DEPARTMENT_OTHER): Payer: HMO | Admitting: Hematology

## 2019-11-07 VITALS — BP 156/79 | HR 74 | Temp 98.5°F | Resp 19 | Ht 72.0 in | Wt 256.0 lb

## 2019-11-07 DIAGNOSIS — C8440 Peripheral T-cell lymphoma, not classified, unspecified site: Secondary | ICD-10-CM | POA: Diagnosis not present

## 2019-11-07 DIAGNOSIS — I5020 Unspecified systolic (congestive) heart failure: Secondary | ICD-10-CM

## 2019-11-07 DIAGNOSIS — I5021 Acute systolic (congestive) heart failure: Secondary | ICD-10-CM

## 2019-11-07 DIAGNOSIS — E871 Hypo-osmolality and hyponatremia: Secondary | ICD-10-CM

## 2019-11-07 DIAGNOSIS — Z5112 Encounter for antineoplastic immunotherapy: Secondary | ICD-10-CM | POA: Diagnosis not present

## 2019-11-07 DIAGNOSIS — T451X5A Adverse effect of antineoplastic and immunosuppressive drugs, initial encounter: Secondary | ICD-10-CM | POA: Diagnosis not present

## 2019-11-07 DIAGNOSIS — D6481 Anemia due to antineoplastic chemotherapy: Secondary | ICD-10-CM | POA: Diagnosis not present

## 2019-11-07 DIAGNOSIS — Z95828 Presence of other vascular implants and grafts: Secondary | ICD-10-CM

## 2019-11-07 LAB — CBC WITH DIFFERENTIAL (CANCER CENTER ONLY)
Abs Immature Granulocytes: 0.47 10*3/uL — ABNORMAL HIGH (ref 0.00–0.07)
Basophils Absolute: 0.1 10*3/uL (ref 0.0–0.1)
Basophils Relative: 1 %
Eosinophils Absolute: 0.7 10*3/uL — ABNORMAL HIGH (ref 0.0–0.5)
Eosinophils Relative: 7 %
HCT: 32 % — ABNORMAL LOW (ref 39.0–52.0)
Hemoglobin: 10.5 g/dL — ABNORMAL LOW (ref 13.0–17.0)
Immature Granulocytes: 5 %
Lymphocytes Relative: 7 %
Lymphs Abs: 0.6 10*3/uL — ABNORMAL LOW (ref 0.7–4.0)
MCH: 28.4 pg (ref 26.0–34.0)
MCHC: 32.8 g/dL (ref 30.0–36.0)
MCV: 86.5 fL (ref 80.0–100.0)
Monocytes Absolute: 1.2 10*3/uL — ABNORMAL HIGH (ref 0.1–1.0)
Monocytes Relative: 14 %
Neutro Abs: 5.9 10*3/uL (ref 1.7–7.7)
Neutrophils Relative %: 66 %
Platelet Count: 257 10*3/uL (ref 150–400)
RBC: 3.7 MIL/uL — ABNORMAL LOW (ref 4.22–5.81)
RDW: 18.7 % — ABNORMAL HIGH (ref 11.5–15.5)
WBC Count: 8.8 10*3/uL (ref 4.0–10.5)
nRBC: 0.3 % — ABNORMAL HIGH (ref 0.0–0.2)

## 2019-11-07 LAB — ECHOCARDIOGRAM COMPLETE
Height: 72 in
Weight: 4096 oz

## 2019-11-07 LAB — CMP (CANCER CENTER ONLY)
ALT: 13 U/L (ref 0–44)
AST: 12 U/L — ABNORMAL LOW (ref 15–41)
Albumin: 3.8 g/dL (ref 3.5–5.0)
Alkaline Phosphatase: 111 U/L (ref 38–126)
Anion gap: 8 (ref 5–15)
BUN: 12 mg/dL (ref 8–23)
CO2: 28 mmol/L (ref 22–32)
Calcium: 9.2 mg/dL (ref 8.9–10.3)
Chloride: 97 mmol/L — ABNORMAL LOW (ref 98–111)
Creatinine: 0.92 mg/dL (ref 0.61–1.24)
GFR, Est AFR Am: >60 mL/min (ref >60)
GFR, Estimated: >60 mL/min (ref >60)
Glucose, Bld: 101 mg/dL — ABNORMAL HIGH (ref 70–99)
Potassium: 4.1 mmol/L (ref 3.5–5.1)
Sodium: 133 mmol/L — ABNORMAL LOW (ref 135–145)
Total Bilirubin: 0.4 mg/dL (ref 0.3–1.2)
Total Protein: 6.9 g/dL (ref 6.5–8.1)

## 2019-11-07 MED ORDER — SODIUM CHLORIDE 0.9% FLUSH
10.0000 mL | Freq: Once | INTRAVENOUS | Status: AC
  Administered 2019-11-07: 10 mL via INTRAVENOUS
  Filled 2019-11-07: qty 10

## 2019-11-07 MED ORDER — HEPARIN SOD (PORK) LOCK FLUSH 100 UNIT/ML IV SOLN
500.0000 [IU] | Freq: Once | INTRAVENOUS | Status: AC
  Administered 2019-11-07: 500 [IU] via INTRAVENOUS
  Filled 2019-11-07: qty 5

## 2019-11-07 NOTE — Telephone Encounter (Signed)
I spoke to the patient and reviewed instructions for his Heart Cath scheduled for 1/19 @ 7:30 with Dr [name].  The patient verbalized understanding.

## 2019-11-07 NOTE — Telephone Encounter (Signed)
lpmtcb 1/15

## 2019-11-07 NOTE — Telephone Encounter (Signed)
See earlier telephone note. Able to reach the patient.  The patient understands that risks include but are not limited to stroke (1 in 1000), death (1 in 66), kidney failure [usually temporary] (1 in 500), bleeding (1 in 200), allergic reaction [possibly serious] (1 in 200), and agrees to proceed.   L & R cath scheduled Tuesday 11/11/19 @7 :30am with Dr. [name].  Please call the patient and let him know time and date.   He needs COVD test tomorrow.  Hold Metformin per protocol. Hold Lasix on day of cath.   He already has updated labs and EKG.

## 2019-11-07 NOTE — Telephone Encounter (Signed)
No change in appts per 1/15 los/  ECHO was scheduled also for today . OK per patient

## 2019-11-07 NOTE — Progress Notes (Addendum)
[facility] OFFICE PROGRESS NOTE  Patient Care Team: Colon Branch, MD as PCP - General [name], [name], MD as PCP - Cardiology (Cardiology) [name], [name], MD as Consulting Physician (Cardiology) [name], MD as Consulting Physician (Gastroenterology) [name], MD as Consulting Physician (Orthopedic Surgery) [name], MD as Consulting Physician (Physical Medicine and Rehabilitation) Luretha Rued, RN as [facility] Management [name], MD as Consulting Physician (Otolaryngology) Tish Men, MD as Medical Oncologist (Oncology) Cordelia Poche, RN as Oncology Nurse Navigator  HEME/ONC OVERVIEW: 1. Stage IV angioimmunoblastic T-cell lymphoma -07/2019: L neck LN FNA non-diagnostic  -08/2019:   Multiple enlarged cervical, mediastinal, bulky retroperitoneal LN's (~5cm), and irregular RLL nodule (1.6x1.1cm) on CT   Excisional left cervical LN bx, path showed angioimmunoblastic T-cell lymphoma, CD30+ (at least 25%)  FDG-avid LN's in the neck, chest and abdomen/pelvis; no bone involvement  Small lymphoid aggregates on the bone marrow biopsy, suspicious for lymphoma involvement   LVEF 96-78%, Grade I diastolic dysfunction  -93/8101 - present: Adcetris + CHP w/ Onpro  TREATMENT SUMMARY:  09/30/2019 - present: Adcetris + CHP w/ Onpro  PERTINENT NON-HEM/ONC PROBLEMS: 1. Acute systolic HF exacerbation -09/2019: admitted for acute systolic heart failure exacerbation; LVEF 45-50%, troponin I peaked at 862 -10/2019: repeat TTE showed LVEF 60-65%, c/w transient ischemia   ASSESSMENT & PLAN:   Stage IV angioimmunoblastic T-cell lymphoma -S/p 2 cycle of CHP + Adcetris; doxorubicin omitted for 2nd cycle due to new onset systolic heart failure  -Patient tolerated the treatment well -No evidence of volume overload on exam today  -Repeat echocardiogram today showed resolution of systolic heart failure, c/w transient ischemia in 09/2019  that led to decline in LVEF -I discussed the case with Dr. [name], who plans to perform LHC w/ stent placement as needed on 11/11/2019 -In addition, he felt that the benefit of resuming anthracycline likely outweigh the risks, and thought that it would be reasonable to resume anthracycline, especially with normalization of LVEF -If the patient undergoes heart cath with stent placement and requires dual antiplatelet therapy, we will need to monitor his labs at least twice a week for thrombocytopenia -While on dual antiplatelet therapy, the goal is to keep platelets > 50k (transfusion as needed) -Plan for 6 cycles (up to 8 cycles in the trial) -PRN anti-emetics: Zofran, Compazine, and Ativan   Heart failure with reduced LVEF -LVEF 45-50% in 09/2019, possibly precipitated by volume overload with demand ischemia vs. chemotherapy  -Repeat TTE showed normalization of LVEF (60-65%) -I had a very lengthy discussion with Dr. [name] of cardiology regarding the new onset heart failure, including some of the potential etiologies of new onset heart failure -He felt that the timing of the decline in LVEF was less consistent with anthracycline-induced cardiomyopathy.  Furthermore, given the very elevated troponin and focal wall motion abnormality in the LAD territory, this is suggestive of ischemia cardiomyopathy -Left heart cath scheduled on 11/11/2019  -If he requires stent placement and dual antiplatelet therapy (minimum of 6 months), we can monitor the platelet count closely to keep it > 50k (with transfusion as needed)  Chemotherapy-associated anemia -Secondary to chemotherapy, as well as anemia of chronic disease and hemodilution -Hgb 10.5, improving -Patient denies any symptom of bleeding -We will monitor for now  Hyponatremia -Etiology unclear; chronic, mild  -Na 133 today, fluctuating but overall stable  -Patient is currently not taking Lasix  -No evidence of volume overload on exam -I  encouraged the patient  to maintain adequate hydration;  as he was recently admitted for acute systolic heart failure exacerbation, I will hold off administering IV fluid for now   No orders of the defined types were placed in this encounter.  The total time spent in the appointment was 73 minutes encounter with patients including review of chart and various tests results, discussions about plan of care and coordination of care plan  All questions were answered. The patient knows to call the clinic with any problems, questions or concerns. No barriers to learning was detected.  Return on 11/17/2019 for Cycle 3 of chemotherapy.   Tish Men, MD 1/15/202110:57 AM  CHIEF COMPLAINT: "I am feeling good"  INTERVAL HISTORY: Mr. Gilbo returns to clinic for follow-up of Stage IV angioimmunoblastic T-cell lymphoma on chemotherapy.  Patient reports that he feels well, and his appetite is very good.  He has not had to take any Lasix for swelling in the leg or shortness of breath.  His energy level is improving.  The bulky cervical lymph nodes have mostly normalized in size.  He denies any constitutional symptoms.  I had a very lengthy discussion with the cardio-oncologist regarding the etiology of the patient's new onset systolic heart failure.  In reviewing the recent echocardiogram, there was apical focal wall motion abnormality involving the anterior septum, suggesting LAD disease.  The tentative plan was to consider heart catheterization with stent placement as needed.  If the patient requires dual antiplatelet therapy, we will plan to monitor his labs closely (at least twice a week) to keep platelets greater than 50,000.    REVIEW OF SYSTEMS:   Constitutional: ( - ) fevers, ( - )  chills , ( - ) night sweats Eyes: ( - ) blurriness of vision, ( - ) double vision, ( - ) watery eyes Ears, nose, mouth, throat, and face: ( - ) mucositis, ( - ) sore throat Respiratory: ( - ) cough, ( - ) dyspnea, ( - )  wheezes Cardiovascular: ( - ) palpitation, ( - ) chest discomfort, ( - ) lower extremity swelling Gastrointestinal:  ( - ) nausea, ( - ) heartburn, ( - ) change in bowel habits Skin: ( - ) abnormal skin rashes Lymphatics: ( - ) new lymphadenopathy, ( - ) easy bruising Neurological: ( - ) numbness, ( - ) tingling, ( - ) new weaknesses Behavioral/Psych: ( - ) mood change, ( - ) new changes  All other systems were reviewed with the patient and are negative.  SUMMARY OF ONCOLOGIC HISTORY: Oncology History  Angioimmunoblastic lymphoma ([place])  08/25/2019 Imaging   CT neck: IMPRESSION: Right greater than left cervical and supraclavicular adenopathy with some nodes demonstrating potential cystic change or necrosis. This may be infectious/inflammatory or neoplastic and tissue sampling is recommended.   08/25/2019 Imaging   CT chest: IMPRESSION: Multiple areas of adenopathy involving the lower neck, left axilla, subcarinal station, and largest areas of nodal masses throughout the visualized retroperitoneum. Splenomegaly.   Scattered inflammatory stranding adjacent to the distal pancreatic body-tail. 11 cm lobulated cyst in the left kidney.   1.6 and 1.1 cm irregular nodules in the medial basilar segment of the right lower lobe.   09/01/2019 Pathology Results   FINAL MICROSCOPIC DIAGNOSIS:   A. LYMPH NODE, RIGHT SUPRACLAVICULAR, EXCISION:  - Angioimmunoblastic T-cell lymphoma.   COMMENT:   Sections reveal soft tissue with mixed inflammation and foreign body  giant cell reaction with focally entrapped abnormal lymph nodes. The  abnormal lymph nodes are effaced  with numerous medium sized atypical  lymphocytes with clear cytoplasm. There are admixed eosinophils, plasma  cells, and smaller lymphocytes. There is increased vascularity. These  atypical cells extend into the surrounding tissue with the mixed soft  tissue reaction. The abnormal lymphocytes are positive for CD2, CD3,  CD4,  PD1, CXCL13, bcl-6, bcl-2, and CD10. CD21 and CD23 highlight  disrupted follicular dendritic networks. There is weak CD30 expression  (25%). They are negative for CD7, CD8, CD15, and CD56. CD138 highlights  plasma cells that are polytypic by light chain in situ hybridization.  EBV in situ hybridization is negative in the B-cell and T-cells. CD20  highlights residual cortical B-cells. GMS, AFB, and PAS are negative for  organisms. Flow cytometry ([id number]) reveals abundant T-cells with  decreased/absent CD7. PCR for T-cell receptor gamma gene rearrangement  is positive. Overall, these findings are consistent with involvement by  angioimmunoblastic T-cell lymphoma. A preliminary diagnosis was called  to Dr. Erik Obey on 12/87/8676. Dr. Maylon Peppers was paged on 09/19/19 for the  final diagnosis.    09/08/2019 Initial Diagnosis   T-cell lymphoma ([place])   09/17/2019 Imaging   Pet: IMPRESSION: 1. Active lymphoma within the neck, chest, abdomen, and pelvis, as detailed above. ([place]) 4 2. Development of small bilateral pleural effusions and abdominal ascites, possibly related to fluid overload. 3. Coronary artery atherosclerosis. Aortic Atherosclerosis (ICD10-I70.0).   09/30/2019 -  Chemotherapy   The patient had DOXOrubicin (ADRIAMYCIN) chemo injection 130 mg, 50 mg/m2 = 130 mg, Intravenous,  Once, 1 of 4 cycles Administration: 130 mg (09/30/2019) palonosetron (ALOXI) injection 0.25 mg, 0.25 mg, Intravenous,  Once, 2 of 6 cycles Administration: 0.25 mg (09/30/2019), 0.25 mg (10/27/2019) pegfilgrastim (NEULASTA ONPRO KIT) injection 6 mg, 6 mg, Subcutaneous, Once, 2 of 6 cycles Administration: 6 mg (09/30/2019), 6 mg (10/27/2019) cyclophosphamide (CYTOXAN) 1,940 mg in sodium chloride 0.9 % 250 mL chemo infusion, 750 mg/m2 = 1,940 mg, Intravenous,  Once, 2 of 6 cycles Administration: 1,940 mg (09/30/2019), 1,940 mg (10/27/2019) DOXOrubicin HCL LIPOSOMAL (DOXIL) 78 mg in dextrose 5 % 250 mL chemo infusion,  30 mg/m2 = 78 mg (100 % of original dose 30 mg/m2), Intravenous,  Once, 0 of 1 cycle Dose modification: 30 mg/m2 (original dose 30 mg/m2, Cycle 3) brentuximab vedotin (ADCETRIS) 135 mg in sodium chloride 0.9 % 100 mL chemo infusion, 135 mg (75 % of original dose 180 mg), Intravenous,  Once, 2 of 6 cycles Dose modification: 180 mg (original dose 180 mg, Cycle 1, Reason: Other (see comments), Comment: Maximum dose Adcetris ), 135 mg (original dose 180 mg, Cycle 1, Reason: Change in SCr/CrCl) Administration: 135 mg (09/30/2019), 180 mg (10/27/2019) fosaprepitant (EMEND) 150 mg, dexamethasone (DECADRON) 12 mg in sodium chloride 0.9 % 145 mL IVPB, , Intravenous,  Once, 2 of 6 cycles Administration:  (09/30/2019),  (10/27/2019)  for chemotherapy treatment.      I have reviewed the past medical history, past surgical history, social history and family history with the patient and they are unchanged from previous note.  ALLERGIES:  is allergic to doxycycline.  MEDICATIONS:  Current Outpatient Medications  Medication Sig Dispense Refill  . allopurinol (ZYLOPRIM) 300 MG tablet Take 1 tablet (300 mg total) by mouth daily. 30 tablet 3  . aspirin 81 MG tablet Take 81 mg by mouth daily.    . carvedilol (COREG) 12.5 MG tablet Take 1 tablet (12.5 mg total) by mouth 2 (two) times daily with a meal. 60 tablet 6  . furosemide (LASIX) 40 MG tablet Take  1 tablet (40 mg total) by mouth daily as needed for fluid or edema. 30 tablet 11  . HYDROcodone-acetaminophen (NORCO) 7.5-325 MG tablet Take 1 tablet by mouth 3 (three) times daily as needed for moderate pain. 60 tablet 0  . LORazepam (ATIVAN) 0.5 MG tablet Take 1 tablet (0.5 mg total) by mouth every 6 (six) hours as needed (Nausea or vomiting). 30 tablet 0  . losartan (COZAAR) 100 MG tablet Take 1 tablet (100 mg total) by mouth daily. 90 tablet 1  . metFORMIN (GLUCOPHAGE) 500 MG tablet Take 1 tablet (500 mg total) by mouth daily with breakfast. 90 tablet 1  .  ondansetron (ZOFRAN) 8 MG tablet Take 1 tablet (8 mg total) by mouth 2 (two) times daily as needed for refractory nausea / vomiting. Start on day 3 after cyclophosphamide chemotherapy. 30 tablet 1  . potassium chloride (KLOR-CON M10) 10 MEQ tablet Take 1 tablet (10 mEq total) by mouth daily. 90 tablet 1  . predniSONE (DELTASONE) 20 MG tablet TAKE 5 TABLETS (100 MG TOTAL) BY MOUTH DAILY FOR 5 DAYS. TAKE ON DAYS 1 5 OF CHEMOTHERAPY.    Marland Kitchen prochlorperazine (COMPAZINE) 10 MG tablet Take 1 tablet (10 mg total) by mouth every 6 (six) hours as needed (Nausea or vomiting). 30 tablet 6  . simvastatin (ZOCOR) 80 MG tablet Take 0.5 tablets (40 mg total) by mouth daily. 45 tablet 1  . traMADol (ULTRAM) 50 MG tablet Take 50 mg by mouth every 6 (six) hours as needed.    . Nutritional Supplements (GLUCERNA 1.0 CAL/CARBSTEADY) LIQD Take 1 Can by mouth daily. Okay to drink up to 3 cans/bottles daily to replace meals as needed. (Patient not taking: Reported on 11/07/2019) 21330 mL 6   No current facility-administered medications for this visit.    PHYSICAL EXAMINATION: ECOG PERFORMANCE STATUS: 1 - Symptomatic but completely ambulatory  Today's Vitals   11/07/19 1018  BP: (!) 156/79  Pulse: 74  Resp: 19  Temp: 98.5 F (36.9 C)  TempSrc: Temporal  SpO2: 99%  Weight: 256 lb (116.1 kg)  Height: 6' (1.829 m)  PainSc: 0-No pain   Body mass index is 34.72 kg/m.  Filed Weights   11/07/19 1018  Weight: 256 lb (116.1 kg)    GENERAL: alert, no distress and comfortable SKIN: skin color, texture, turgor are normal, no rashes or significant lesions EYES: conjunctiva are pink and non-injected, sclera clear OROPHARYNX: no exudate, no erythema; lips, buccal mucosa, and tongue normal  NECK: supple, non-tender LYMPH:  no palpable lymphadenopathy in the cervical LUNGS: clear to auscultation with normal breathing effort HEART: regular rate & rhythm and no murmurs and no lower extremity edema ABDOMEN: soft,  non-tender, non-distended, normal bowel sounds Musculoskeletal: no cyanosis of digits and no clubbing  PSYCH: alert & oriented x 3, fluent speech  LABORATORY DATA:  I have reviewed the data as listed    Component Value Date/Time   NA 133 (L) 11/07/2019 1000   K 4.1 11/07/2019 1000   CL 97 (L) 11/07/2019 1000   CO2 28 11/07/2019 1000   GLUCOSE 101 (H) 11/07/2019 1000   GLUCOSE 101 (H) 08/30/2006 1117   BUN 12 11/07/2019 1000   CREATININE 0.92 11/07/2019 1000   CREATININE 1.41 (H) 06/06/2019 1528   CALCIUM 9.2 11/07/2019 1000   PROT 6.9 11/07/2019 1000   ALBUMIN 3.8 11/07/2019 1000   AST 12 (L) 11/07/2019 1000   ALT 13 11/07/2019 1000   ALKPHOS 111 11/07/2019 1000   BILITOT 0.4 11/07/2019  1000   GFRNONAA >60 11/07/2019 1000   GFRAA >60 11/07/2019 1000    No results found for: SPEP, UPEP  Lab Results  Component Value Date   WBC 8.8 11/07/2019   NEUTROABS 5.9 11/07/2019   HGB 10.5 (L) 11/07/2019   HCT 32.0 (L) 11/07/2019   MCV 86.5 11/07/2019   PLT 257 11/07/2019      Chemistry      Component Value Date/Time   NA 133 (L) 11/07/2019 1000   K 4.1 11/07/2019 1000   CL 97 (L) 11/07/2019 1000   CO2 28 11/07/2019 1000   BUN 12 11/07/2019 1000   CREATININE 0.92 11/07/2019 1000   CREATININE 1.41 (H) 06/06/2019 1528      Component Value Date/Time   CALCIUM 9.2 11/07/2019 1000   ALKPHOS 111 11/07/2019 1000   AST 12 (L) 11/07/2019 1000   ALT 13 11/07/2019 1000   BILITOT 0.4 11/07/2019 1000       RADIOGRAPHIC STUDIES: I have personally reviewed the radiological images as listed below and agreed with the findings in the report. CT ANGIO CHEST PE W OR WO CONTRAST  Result Date: 10/15/2019 CLINICAL DATA:  PE suspected, high pretest probability. Elevated D-dimer and shortness of breath. History of T-cell lymphoma, per earlier radiology reports. EXAM: CT ANGIOGRAPHY CHEST WITH CONTRAST TECHNIQUE: Multidetector CT imaging of the chest was performed using the standard  protocol during bolus administration of intravenous contrast. Multiplanar CT image reconstructions and MIPs were obtained to evaluate the vascular anatomy. CONTRAST:  161m OMNIPAQUE IOHEXOL 350 MG/ML SOLN COMPARISON:  Chest CT angiogram dated 08/25/2019. FINDINGS: Cardiovascular: Study is limited due to patient breathing motion artifact and attenuation artifact. Given these limitations, there is no convincing pulmonary embolism identified. No thoracic aortic aneurysm or evidence of aortic dissection. Cardiomegaly. No pericardial effusion. Mediastinum/Nodes: Grossly stable subcarinal lymphadenopathy. Esophagus is unremarkable. Trachea is unremarkable. Lungs/Pleura: Ground-glass consolidations are present within the LEFT upper lobe, lingula and LEFT lower lobe. More confluent consolidation is present within the anterior aspects of the LEFT upper lobe. RIGHT pleural effusion, moderate to large in size, with adjacent atelectasis. Small LEFT pleural effusion. Upper Abdomen: Low-density material surrounding the pancreatic head and proximal pancreatic body, extending into the portacaval space, presumably patient's known abdominal lymphadenopathy, incompletely imaged at the lower portion of the study. Small amount of free fluid in the upper abdomen, as described on the earlier PET-CT report of 09/17/2019. w Musculoskeletal: No acute or suspicious osseous finding. Bilateral axillary lymphadenopathy, LEFT greater than RIGHT, as previously described. Review of the MIP images confirms the above findings. IMPRESSION: 1. No pulmonary embolism seen, but with fairly significant study limitations as detailed above. 2. New ground-glass opacities within the LEFT upper lobe, lingula and LEFT lower lobe. Differential includes atypical pneumonias such as viral (including COVID-19 pneumonia) or fungal, interstitial pneumonias, edema related to volume overload/CHF, chronic interstitial diseases, hypersensitivity pneumonitis, and  respiratory bronchiolitis. 3. More confluent consolidation within the anterior aspects of the LEFT upper lobe, also suspicious for pneumonia. 4. RIGHT pleural effusion, moderate to large in size, with adjacent atelectasis. 5. Bilateral axillary lymphadenopathy, LEFT greater than RIGHT, as previously described, compatible with previous radiology reports describing T-cell lymphoma/ischemia. Also grossly stable subcarinal and upper abdominal lymphadenopathy. 6. Small amount of free fluid in the upper abdomen, as described on the earlier PET-CT report of 09/17/2019. 7. Cardiomegaly. These results will be called to the ordering clinician or representative by the Radiologist Assistant, and communication documented in the PACS or zVision Dashboard. Electronically  Signed   By: Franki Cabot M.D.   On: 10/15/2019 16:15   DG Chest Port 1 View  Result Date: 10/15/2019 CLINICAL DATA:  Shortness of breath EXAM: PORTABLE CHEST 1 VIEW COMPARISON:  10/08/2019 FINDINGS: Right Port-A-Cath remains in place, with the tip in the SVC. Cardiomegaly, vascular congestion. Improved aeration of the lungs. Minimal residual patchy opacities in both lungs. No visible significant effusions or acute bony abnormality. IMPRESSION: Improved aeration in the lungs. Minimal residual patchy bilateral airspace disease. No visible effusions. Cardiomegaly, vascular congestion. Electronically Signed   By: Rolm Baptise M.D.   On: 10/15/2019 09:25   DG Chest Port 1 View  Result Date: 10/08/2019 CLINICAL DATA:  Cough, shortness of breath, weakness EXAM: PORTABLE CHEST 1 VIEW COMPARISON:  Radiograph 09/23/2019 FINDINGS: Globally increased opacity in the right hemithorax with indistinctness of the right lung base likely reflecting some layering pleural fluid on a background of more diffuse interstitial airspace opacities. Layering left pleural effusion is present as well. A right IJ approach Port-A-Cath tip terminates in the superior cavoatrial  junction. Cardiomegaly is similar to priors. IMPRESSION: Patchy interstitial and airspace opacity throughout both lungs with layering bilateral effusions. Appearance favoring CHF/volume overload though infection could have a similar appearance. Electronically Signed   By: Lovena Le M.D.   On: 10/08/2019 22:45   ECHOCARDIOGRAM COMPLETE  Result Date: 10/09/2019   ECHOCARDIOGRAM REPORT   Patient Name:   ZACARIAH BELUE Date of Exam: 10/09/2019 Medical Rec #:  [id number]     Height:       72.0 in Accession #:    [id number]    Weight:       304.5 lb Date of Birth:  1940-12-24     BSA:          2.55 m Patient Age:    25 years      BP:           131/81 mmHg Patient Gender: M             HR:           85 bpm. Exam Location:  Inpatient Procedure: 2D Echo, Cardiac Doppler, Color Doppler and Intracardiac            Opacification Agent Indications:    Congestive Heart Failure 428.0  History:        Patient has prior history of Echocardiogram examinations, most                 recent 09/12/2019. CHF, Arrythmias:PVC; Risk                 Factors:Hypertension, Diabetes, Dyslipidemia and Former Smoker.                 Elevated troponin. GERD. Palpitations.  Sonographer:    Paulita Fujita RDCS Referring Phys: [id number] [place]  1. Left ventricular ejection fraction, by visual estimation, is 45 to 50%. The left ventricle has mildly decreased function. There is no left ventricular hypertrophy.  2. Mild hypokinesis of the left ventricular, mid-apical anterior wall and apical segment.  3. Definity contrast agent was given IV to delineate the left ventricular endocardial borders.  4. Left ventricular diastolic parameters are consistent with Grade I diastolic dysfunction (impaired relaxation).  5. Mild to moderately dilated left ventricular internal cavity size.  6. The left ventricle demonstrates regional wall motion abnormalities.  7. Global right ventricle has low normal systolic function.The right ventricular  size is normal. No increase  in right ventricular wall thickness.  8. Left atrial size was normal.  9. Right atrial size was normal. 10. The mitral valve is grossly normal. Trivial mitral valve regurgitation. 11. The tricuspid valve is grossly normal. Tricuspid valve regurgitation is mild. 12. The aortic valve is tricuspid. Aortic valve regurgitation is trivial. Mild aortic valve sclerosis without stenosis. 13. The pulmonic valve was grossly normal. Pulmonic valve regurgitation is not visualized. 14. Aortic dilatation noted. 15. 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 16. Mildly elevated pulmonary artery systolic pressure. 17. The inferior vena cava is dilated in size with >50% respiratory variability, suggesting right atrial pressure of 8 mmHg. FINDINGS  Left Ventricle: Left ventricular ejection fraction, by visual estimation, is 45 to 50%. The left ventricle has mildly decreased function. Mild hypokinesis of the left ventricular, mid-apical anterior wall and apical segment. Definity contrast agent was given IV to delineate the left ventricular endocardial borders. The left ventricle demonstrates regional wall motion abnormalities. The left ventricular internal cavity size was mildly to moderately dilated left ventricle. There is no left ventricular hypertrophy. Left ventricular diastolic parameters are consistent with Grade I diastolic dysfunction (impaired relaxation). Indeterminate filling pressures. Right Ventricle: The right ventricular size is normal. No increase in right ventricular wall thickness. Global RV systolic function is has low normal systolic function. The tricuspid regurgitant velocity is 2.62 m/s, and with an assumed right atrial pressure of 8 mmHg, the estimated right ventricular systolic pressure is mildly elevated at 35.5 mmHg. Left Atrium: Left atrial size was normal in size. Right Atrium: Right atrial size was normal in size Pericardium: There is no evidence of pericardial  effusion. Mitral Valve: The mitral valve is grossly normal. Trivial mitral valve regurgitation. Tricuspid Valve: The tricuspid valve is grossly normal. Tricuspid valve regurgitation is mild. Aortic Valve: The aortic valve is tricuspid. Aortic valve regurgitation is trivial. Mild aortic valve sclerosis is present, with no evidence of aortic valve stenosis. Pulmonic Valve: The pulmonic valve was grossly normal. Pulmonic valve regurgitation is not visualized. Pulmonic regurgitation is not visualized. Aorta: Aortic dilatation noted. 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 Venous: The inferior vena cava is dilated in size with greater than 50% respiratory variability, suggesting right atrial pressure of 8 mmHg. IAS/Shunts: No atrial level shunt detected by color flow Doppler.  LEFT VENTRICLE PLAX 2D LVIDd:         6.30 cm       Diastology LVIDs:         4.20 cm       LV e' lateral:   10.30 cm/s LV PW:         1.00 cm       LV E/e' lateral: 6.3 LV IVS:        1.00 cm       LV e' medial:    7.18 cm/s LVOT diam:     2.20 cm       LV E/e' medial:  9.1 LV SV:         123 ml LV SV Index:   45.27 LVOT Area:     3.80 cm  LV Volumes (MOD) LV area d, A2C:    50.30 cm LV area d, A4C:    50.10 cm LV area s, A2C:    33.40 cm LV area s, A4C:    30.20 cm LV major d, A2C:   10.20 cm LV major d, A4C:   9.67 cm LV major s, A2C:  8.58 cm LV major s, A4C:   7.69 cm LV vol d, MOD A2C: 208.0 ml LV vol d, MOD A4C: 214.0 ml LV vol s, MOD A2C: 111.0 ml LV vol s, MOD A4C: 98.8 ml LV SV MOD A2C:     97.0 ml LV SV MOD A4C:     214.0 ml LV SV MOD BP:      106.7 ml RIGHT VENTRICLE RV S prime:     12.90 cm/s TAPSE (M-mode): 2.2 cm LEFT ATRIUM             Index       RIGHT ATRIUM           Index LA diam:        3.70 cm 1.45 cm/m  RA Area:     15.60 cm LA Vol (A2C):   77.8 ml 30.54 ml/m RA Volume:   35.20 ml  13.82 ml/m LA Vol (A4C):   71.1 ml 27.91 ml/m LA Biplane Vol: 76.7 ml 30.11 ml/m  AORTIC VALVE LVOT Vmax:   131.00  cm/s LVOT Vmean:  91.400 cm/s LVOT VTI:    0.238 m  AORTA Ao Root diam: 4.00 cm MITRAL VALVE                        TRICUSPID VALVE MV Area (PHT): 3.42 cm             TR Peak grad:   27.5 mmHg MV PHT:        64.38 msec           TR Vmax:        262.00 cm/s MV Decel Time: 222 msec MV E velocity: 65.10 cm/s 103 cm/s  SHUNTS MV A velocity: 50.60 cm/s 70.3 cm/s Systemic VTI:  0.24 m MV E/A ratio:  1.29       1.5       Systemic Diam: 2.20 cm  Lyman Bishop MD Electronically signed by Lyman Bishop MD Signature Date/Time: 10/09/2019/4:09:07 PM    Final

## 2019-11-07 NOTE — Patient Instructions (Signed)
Tunneled Central Venous Catheter Flushing Guide  It is important to flush your tunneled central venous catheter each time you use it, both before and after you use it. Flushing your catheter will help prevent it from clogging. What are the risks? Risks may include:  Infection.  Air getting into the catheter and bloodstream. Supplies needed:  A clean pair of gloves.  A disinfecting wipe. Use an alcohol wipe, chlorhexidine wipe, or iodine wipe as told by your health care provider.  A 10 mL syringe that has been prefilled with saline solution.  An empty 10 mL syringe, if a substance called heparin was injected into your catheter. How to flush your catheter When you flush your catheter, make sure you follow any specific instructions from your health care provider or the manufacturer. These are general guidelines. Flushing your catheter before use If there is heparin in your catheter: 1. Wash your hands with soap and water. 2. Put on gloves. 3. Scrub the injection cap for a minimum of 15 seconds with a disinfecting wipe. 4. Unclamp the catheter. 5. Attach the empty syringe to the injection cap. 6. Pull the syringe plunger back and withdraw 10 mL of blood. 7. Place the syringe into an appropriate waste container. 8. Scrub the injection cap for 15 seconds with a disinfecting wipe. 9. Attach the prefilled syringe to the injection cap. 10. Flush the catheter by pushing the plunger forward until all the liquid from the syringe is in the catheter. 11. Remove the syringe from the injection cap. 12. Clamp the catheter. If there is no heparin in your catheter: 1. Wash your hands with soap and water. 2. Put on gloves. 3. Scrub the injection cap for 15 seconds with a disinfecting wipe. 4. Unclamp the catheter. 5. Attach the prefilled syringe to the injection cap. 6. Flush the catheter by pushing the plunger forward until 5 mL of the liquid from the syringe is in the catheter. 7. Pull back on  the syringe until you see blood in the catheter. 8. If you have been asked to collect any blood, follow your health care provider's instructions. Otherwise, flush the catheter with the rest of the solution from the syringe. 9. Remove the syringe from the injection cap. 10. Clamp the catheter.  Flushing your catheter after use 1. Wash your hands with soap and water. 2. Put on gloves. 3. Scrub the injection cap for 15 seconds with a disinfecting wipe. 4. Unclamp the catheter. 5. Attach the prefilled syringe to the injection cap. 6. Flush the catheter by pushing the plunger forward until all of the liquid from the syringe is in the catheter. 7. Remove the syringe from the injection cap. 8. Clamp the catheter. Problems and solutions  If blood cannot be completely cleared from the injection cap, you may need to have the injection cap replaced.  If the catheter is difficult to flush, use the pulsing method. The pulsing method involves pushing only a few milliliters of solution into the catheter at a time and pausing between pushes.  If you do not see blood in the catheter when you pull back on the syringe, change your body position, such as by raising your arms above your head. Take a deep breath and cough. Then, pull back on the syringe. If you still do not see blood, flush the catheter with a small amount of solution. Then, change positions again and take a breath or cough. Pull back on the syringe again. If you still do not see   blood, finish flushing the catheter and contact your health care provider. Do not use your catheter until your health care provider says it is okay. General tips  Have someone help you flush your catheter, if possible.  Do not force fluid through your catheter.  Do not use a syringe that is larger or smaller than 10 mL. Using a smaller syringe can make the catheter burst.  Do not use your catheter without flushing it first if it has heparin in it. Contact a health  care provider if:  You cannot see any blood in the catheter when you flush it before using it.  Your catheter is difficult to flush. Get help right away if:  You cannot flush the catheter.  The catheter leaks when you flush it or when there is fluid in it.  There are cracks or breaks in the catheter. Summary  It is important to flush your tunneled central venous catheter each time you use it, both before and after you use it.  Scrub the injection cap for 15 seconds with a disinfecting wipe before and after you flush it.  When you flush your catheter, make sure you follow any specific instructions from your health care provider or the manufacturer.  Get help right away if you cannot flush the catheter. This information is not intended to replace advice given to you by your health care provider. Make sure you discuss any questions you have with your health care provider. Document Revised: 07/04/2019 Document Reviewed: 12/25/2018 Elsevier Patient Education  2020 [employer]  

## 2019-11-07 NOTE — Telephone Encounter (Signed)
Received call from Dr. [name] who received call from oncologist. 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 [name] and suggested L & R cath with him next week either Tuesday or Thursday.   I have called him to discuss recommendations and risk factor but went on one picked up phone. LVM to call back.   I will try again later in afternoon.

## 2019-11-07 NOTE — Progress Notes (Signed)
  Echocardiogram 2D Echocardiogram has been performed.  [name] [name] 11/07/2019, 1:47 PM

## 2019-11-07 NOTE — Addendum Note (Signed)
Addended by: Amelia Jo I on: 11/07/2019 11:29 AM   Modules accepted: Orders

## 2019-11-08 ENCOUNTER — Other Ambulatory Visit (HOSPITAL_COMMUNITY)
Admission: RE | Admit: 2019-11-08 | Discharge: 2019-11-08 | Disposition: A | Discharge status: Home / Self Care | Payer: HMO | Source: Ambulatory Visit | Attending: Internal Medicine | Admitting: Internal Medicine

## 2019-11-08 ENCOUNTER — Other Ambulatory Visit (HOSPITAL_COMMUNITY): Payer: HMO

## 2019-11-08 DIAGNOSIS — Z01812 Encounter for preprocedural laboratory examination: Secondary | ICD-10-CM | POA: Insufficient documentation

## 2019-11-08 DIAGNOSIS — Z20822 Contact with and (suspected) exposure to covid-19: Secondary | ICD-10-CM | POA: Diagnosis not present

## 2019-11-08 LAB — SARS CORONAVIRUS 2 (TAT 6-24 HRS): SARS Coronavirus 2: NEGATIVE

## 2019-11-10 ENCOUNTER — Ambulatory Visit: Payer: HMO | Admitting: Hematology

## 2019-11-10 ENCOUNTER — Other Ambulatory Visit: Payer: HMO

## 2019-11-10 ENCOUNTER — Ambulatory Visit: Payer: HMO

## 2019-11-10 DIAGNOSIS — D630 Anemia in neoplastic disease: Secondary | ICD-10-CM | POA: Diagnosis not present

## 2019-11-10 DIAGNOSIS — M48061 Spinal stenosis, lumbar region without neurogenic claudication: Secondary | ICD-10-CM | POA: Diagnosis not present

## 2019-11-10 DIAGNOSIS — Z96611 Presence of right artificial shoulder joint: Secondary | ICD-10-CM | POA: Diagnosis not present

## 2019-11-10 DIAGNOSIS — C8440 Peripheral T-cell lymphoma, not classified, unspecified site: Secondary | ICD-10-CM | POA: Diagnosis not present

## 2019-11-10 DIAGNOSIS — Z9181 History of falling: Secondary | ICD-10-CM | POA: Diagnosis not present

## 2019-11-10 DIAGNOSIS — J9691 Respiratory failure, unspecified with hypoxia: Secondary | ICD-10-CM | POA: Diagnosis not present

## 2019-11-10 DIAGNOSIS — I5020 Unspecified systolic (congestive) heart failure: Secondary | ICD-10-CM | POA: Diagnosis not present

## 2019-11-10 DIAGNOSIS — E669 Obesity, unspecified: Secondary | ICD-10-CM | POA: Diagnosis not present

## 2019-11-10 DIAGNOSIS — Z7982 Long term (current) use of aspirin: Secondary | ICD-10-CM | POA: Diagnosis not present

## 2019-11-10 DIAGNOSIS — Z7984 Long term (current) use of oral hypoglycemic drugs: Secondary | ICD-10-CM | POA: Diagnosis not present

## 2019-11-10 DIAGNOSIS — I110 Hypertensive heart disease with heart failure: Secondary | ICD-10-CM | POA: Diagnosis not present

## 2019-11-10 DIAGNOSIS — E119 Type 2 diabetes mellitus without complications: Secondary | ICD-10-CM | POA: Diagnosis not present

## 2019-11-10 DIAGNOSIS — M1990 Unspecified osteoarthritis, unspecified site: Secondary | ICD-10-CM | POA: Diagnosis not present

## 2019-11-11 ENCOUNTER — Encounter (HOSPITAL_COMMUNITY)
Admission: RE | Disposition: A | Discharge status: Home / Self Care | Payer: Self-pay | Source: Home / Self Care | Attending: Internal Medicine

## 2019-11-11 ENCOUNTER — Ambulatory Visit (HOSPITAL_COMMUNITY)
Admission: RE | Admit: 2019-11-11 | Discharge: 2019-11-11 | Disposition: A | Discharge status: Home / Self Care | Payer: HMO | Attending: Internal Medicine | Admitting: Internal Medicine

## 2019-11-11 ENCOUNTER — Other Ambulatory Visit: Payer: Self-pay

## 2019-11-11 DIAGNOSIS — Z881 Allergy status to other antibiotic agents status: Secondary | ICD-10-CM | POA: Diagnosis not present

## 2019-11-11 DIAGNOSIS — I5022 Chronic systolic (congestive) heart failure: Secondary | ICD-10-CM | POA: Diagnosis not present

## 2019-11-11 DIAGNOSIS — Z7984 Long term (current) use of oral hypoglycemic drugs: Secondary | ICD-10-CM | POA: Insufficient documentation

## 2019-11-11 DIAGNOSIS — I110 Hypertensive heart disease with heart failure: Secondary | ICD-10-CM | POA: Insufficient documentation

## 2019-11-11 DIAGNOSIS — K219 Gastro-esophageal reflux disease without esophagitis: Secondary | ICD-10-CM | POA: Insufficient documentation

## 2019-11-11 DIAGNOSIS — Z7952 Long term (current) use of systemic steroids: Secondary | ICD-10-CM | POA: Insufficient documentation

## 2019-11-11 DIAGNOSIS — Z87891 Personal history of nicotine dependence: Secondary | ICD-10-CM | POA: Insufficient documentation

## 2019-11-11 DIAGNOSIS — Z96611 Presence of right artificial shoulder joint: Secondary | ICD-10-CM | POA: Diagnosis not present

## 2019-11-11 DIAGNOSIS — Z8249 Family history of ischemic heart disease and other diseases of the circulatory system: Secondary | ICD-10-CM | POA: Insufficient documentation

## 2019-11-11 DIAGNOSIS — C865 Angioimmunoblastic T-cell lymphoma: Secondary | ICD-10-CM | POA: Insufficient documentation

## 2019-11-11 DIAGNOSIS — M1990 Unspecified osteoarthritis, unspecified site: Secondary | ICD-10-CM | POA: Insufficient documentation

## 2019-11-11 DIAGNOSIS — E119 Type 2 diabetes mellitus without complications: Secondary | ICD-10-CM | POA: Diagnosis not present

## 2019-11-11 DIAGNOSIS — Z7982 Long term (current) use of aspirin: Secondary | ICD-10-CM | POA: Insufficient documentation

## 2019-11-11 DIAGNOSIS — R931 Abnormal findings on diagnostic imaging of heart and coronary circulation: Secondary | ICD-10-CM | POA: Diagnosis not present

## 2019-11-11 DIAGNOSIS — I214 Non-ST elevation (NSTEMI) myocardial infarction: Secondary | ICD-10-CM

## 2019-11-11 DIAGNOSIS — Z833 Family history of diabetes mellitus: Secondary | ICD-10-CM | POA: Insufficient documentation

## 2019-11-11 DIAGNOSIS — E785 Hyperlipidemia, unspecified: Secondary | ICD-10-CM | POA: Insufficient documentation

## 2019-11-11 DIAGNOSIS — I2510 Atherosclerotic heart disease of native coronary artery without angina pectoris: Secondary | ICD-10-CM

## 2019-11-11 DIAGNOSIS — Z79899 Other long term (current) drug therapy: Secondary | ICD-10-CM | POA: Insufficient documentation

## 2019-11-11 DIAGNOSIS — Z96653 Presence of artificial knee joint, bilateral: Secondary | ICD-10-CM | POA: Insufficient documentation

## 2019-11-11 HISTORY — PX: RIGHT/LEFT HEART CATH AND CORONARY ANGIOGRAPHY: CATH118266

## 2019-11-11 LAB — POCT I-STAT 7, (LYTES, BLD GAS, ICA,H+H)
Acid-Base Excess: 1 mmol/L (ref 0.0–2.0)
Bicarbonate: 23.2 mmol/L (ref 20.0–28.0)
Calcium, Ion: 1.1 mmol/L — ABNORMAL LOW (ref 1.15–1.40)
HCT: 29 % — ABNORMAL LOW (ref 39.0–52.0)
Hemoglobin: 9.9 g/dL — ABNORMAL LOW (ref 13.0–17.0)
O2 Saturation: 99 %
Potassium: 3.6 mmol/L (ref 3.5–5.1)
Sodium: 136 mmol/L (ref 135–145)
TCO2: 24 mmol/L (ref 22–32)
pCO2 arterial: 28.7 mmHg — ABNORMAL LOW (ref 32.0–48.0)
pH, Arterial: 7.516 — ABNORMAL HIGH (ref 7.350–7.450)
pO2, Arterial: 126 mmHg — ABNORMAL HIGH (ref 83.0–108.0)

## 2019-11-11 LAB — POCT I-STAT EG7
Acid-Base Excess: 1 mmol/L (ref 0.0–2.0)
Acid-base deficit: 4 mmol/L — ABNORMAL HIGH (ref 0.0–2.0)
Bicarbonate: 24.7 mmol/L (ref 20.0–28.0)
Bicarbonate: 20 mmol/L (ref 20.0–28.0)
Calcium, Ion: 1.15 mmol/L (ref 1.15–1.40)
Calcium, Ion: 0.85 mmol/L — CL (ref 1.15–1.40)
HCT: 32 % — ABNORMAL LOW (ref 39.0–52.0)
HCT: 30 % — ABNORMAL LOW (ref 39.0–52.0)
Hemoglobin: 10.9 g/dL — ABNORMAL LOW (ref 13.0–17.0)
Hemoglobin: 10.2 g/dL — ABNORMAL LOW (ref 13.0–17.0)
O2 Saturation: 78 %
O2 Saturation: 75 %
Potassium: 3.7 mmol/L (ref 3.5–5.1)
Potassium: 2.9 mmol/L — ABNORMAL LOW (ref 3.5–5.1)
Sodium: 135 mmol/L (ref 135–145)
Sodium: 142 mmol/L (ref 135–145)
TCO2: 26 mmol/L (ref 22–32)
TCO2: 21 mmol/L — ABNORMAL LOW (ref 22–32)
pCO2, Ven: 34.1 mmHg — ABNORMAL LOW (ref 44.0–60.0)
pCO2, Ven: 30.5 mmHg — ABNORMAL LOW (ref 44.0–60.0)
pH, Ven: 7.468 — ABNORMAL HIGH (ref 7.250–7.430)
pH, Ven: 7.426 (ref 7.250–7.430)
pO2, Ven: 39 mmHg (ref 32.0–45.0)
pO2, Ven: 39 mmHg (ref 32.0–45.0)

## 2019-11-11 LAB — GLUCOSE, CAPILLARY
Glucose-Capillary: 111 mg/dL — ABNORMAL HIGH (ref 70–99)
Glucose-Capillary: 101 mg/dL — ABNORMAL HIGH (ref 70–99)

## 2019-11-11 SURGERY — RIGHT/LEFT HEART CATH AND CORONARY ANGIOGRAPHY
Anesthesia: LOCAL

## 2019-11-11 MED ORDER — SODIUM CHLORIDE 0.9 % IV SOLN: INTRAVENOUS | Status: DC

## 2019-11-11 MED ORDER — LIDOCAINE HCL (PF) 1 % IJ SOLN
INTRAMUSCULAR | Status: AC
  Filled 2019-11-11: qty 30

## 2019-11-11 MED ORDER — SODIUM CHLORIDE 0.9% FLUSH: 3.0000 mL | Freq: Two times a day (BID) | INTRAVENOUS | Status: DC

## 2019-11-11 MED ORDER — IOHEXOL 350 MG/ML SOLN
INTRAVENOUS | Status: DC | PRN
  Administered 2019-11-11: 08:00:00 45 mL via INTRACARDIAC

## 2019-11-11 MED ORDER — VERAPAMIL HCL 2.5 MG/ML IV SOLN
INTRAVENOUS | Status: AC
  Filled 2019-11-11: qty 2

## 2019-11-11 MED ORDER — HEPARIN SODIUM (PORCINE) 1000 UNIT/ML IJ SOLN
INTRAMUSCULAR | Status: DC | PRN
  Administered 2019-11-11: 5000 [IU] via INTRAVENOUS

## 2019-11-11 MED ORDER — ONDANSETRON HCL 4 MG/2ML IJ SOLN: 4.0000 mg | Freq: Four times a day (QID) | INTRAMUSCULAR | Status: DC | PRN

## 2019-11-11 MED ORDER — VERAPAMIL HCL 2.5 MG/ML IV SOLN
INTRAVENOUS | Status: DC | PRN
  Administered 2019-11-11: 08:00:00 10 mL via INTRA_ARTERIAL

## 2019-11-11 MED ORDER — HEPARIN (PORCINE) IN NACL 1000-0.9 UT/500ML-% IV SOLN
INTRAVENOUS | Status: DC | PRN
  Administered 2019-11-11 (×2): 500 mL

## 2019-11-11 MED ORDER — HYDRALAZINE HCL 20 MG/ML IJ SOLN: 10.0000 mg | INTRAMUSCULAR | Status: DC | PRN

## 2019-11-11 MED ORDER — ASPIRIN 81 MG PO CHEW
81.0000 mg | CHEWABLE_TABLET | ORAL | Status: AC
  Administered 2019-11-11: 07:00:00 81 mg via ORAL
  Filled 2019-11-11: qty 1

## 2019-11-11 MED ORDER — ACETAMINOPHEN 325 MG PO TABS: 650.0000 mg | ORAL_TABLET | ORAL | Status: DC | PRN

## 2019-11-11 MED ORDER — SODIUM CHLORIDE 0.9 % IV SOLN: INTRAVENOUS | Status: AC

## 2019-11-11 MED ORDER — SODIUM CHLORIDE 0.9% FLUSH: 3.0000 mL | INTRAVENOUS | Status: DC | PRN

## 2019-11-11 MED ORDER — SODIUM CHLORIDE 0.9 % IV SOLN: 250.0000 mL | INTRAVENOUS | Status: DC | PRN

## 2019-11-11 MED ORDER — LIDOCAINE HCL (PF) 1 % IJ SOLN
INTRAMUSCULAR | Status: DC | PRN
  Administered 2019-11-11 (×2): 2 mL via INTRADERMAL

## 2019-11-11 MED ORDER — LABETALOL HCL 5 MG/ML IV SOLN: 10.0000 mg | INTRAVENOUS | Status: DC | PRN

## 2019-11-11 MED ORDER — HEPARIN SODIUM (PORCINE) 1000 UNIT/ML IJ SOLN
INTRAMUSCULAR | Status: AC
  Filled 2019-11-11: qty 1

## 2019-11-11 MED ORDER — MIDAZOLAM HCL 2 MG/2ML IJ SOLN
INTRAMUSCULAR | Status: DC | PRN
  Administered 2019-11-11: 1 mg via INTRAVENOUS

## 2019-11-11 MED ORDER — FENTANYL CITRATE (PF) 100 MCG/2ML IJ SOLN
INTRAMUSCULAR | Status: AC
  Filled 2019-11-11: qty 2

## 2019-11-11 MED ORDER — MIDAZOLAM HCL 2 MG/2ML IJ SOLN
INTRAMUSCULAR | Status: AC
  Filled 2019-11-11: qty 2

## 2019-11-11 MED ORDER — HEPARIN (PORCINE) IN NACL 1000-0.9 UT/500ML-% IV SOLN
INTRAVENOUS | Status: AC
  Filled 2019-11-11: qty 1000

## 2019-11-11 MED ORDER — FENTANYL CITRATE (PF) 100 MCG/2ML IJ SOLN
INTRAMUSCULAR | Status: DC | PRN
  Administered 2019-11-11: 25 ug via INTRAVENOUS

## 2019-11-11 SURGICAL SUPPLY — 10 items
CATH 5FR JL3.5 JR4 ANG PIG MP (CATHETERS) ×1 IMPLANT
CATH BALLN WEDGE 5F 110CM (CATHETERS) ×1 IMPLANT
DEVICE RAD COMP TR BAND LRG (VASCULAR PRODUCTS) ×1 IMPLANT
GLIDESHEATH SLEND SS 6F .021 (SHEATH) ×1 IMPLANT
GUIDEWIRE INQWIRE 1.5J.035X260 (WIRE) IMPLANT
INQWIRE 1.5J .035X260CM (WIRE) ×2
KIT HEART LEFT (KITS) ×1 IMPLANT
PACK CARDIAC CATHETERIZATION (CUSTOM PROCEDURE TRAY) ×2 IMPLANT
SHEATH GLIDE SLENDER 4/5FR (SHEATH) ×1 IMPLANT
TRANSDUCER W/STOPCOCK (MISCELLANEOUS) ×2 IMPLANT

## 2019-11-11 NOTE — Discharge Instructions (Signed)
DRINK PLENTY OF FLUIDS FOR THE NEXT 2-3 DAYS.  KEEP ARM ELEVATED THE REMAINDER OF THE DAY.  HOLD METFORMIN FOR A FULL 48 HOURS AFTER DISCHARGE.  Radial Site Care  This sheet gives you information about how to care for yourself after your procedure. Your health care provider may also give you more specific instructions. If you have problems or questions, contact your health care provider. What can I expect after the procedure? After the procedure, it is common to have:  Bruising and tenderness at the catheter insertion area. Follow these instructions at home: Medicines  Take over-the-counter and prescription medicines only as told by your health care provider. Insertion site care  Follow instructions from your health care provider about how to take care of your insertion site. Make sure you: ? Wash your hands with soap and water before you change your bandage (dressing). If soap and water are not available, use hand sanitizer. ? Change your dressing as told by your health care provider.  Check your insertion site every day for signs of infection. Check for: ? Redness, swelling, or pain. ? Fluid or blood. ? Pus or a bad smell. ? Warmth.  Do not take baths, swim, or use a hot tub for 5 days.  You may shower 24-48 hours after the procedure. ? Remove the dressing and gently wash the site with plain soap and water. ? Pat the area dry with a clean towel. ? Do not rub the site. That could cause bleeding.  Do not apply powder or lotion to the site. Activity   For 24 hours after the procedure, or as directed by your health care provider: ? Do not flex or bend the affected arm. ? Do not push or pull heavy objects with the affected arm. ? Do not drive yourself home from the hospital or clinic. You may drive 24 hours after the procedure unless your health care provider tells you not to. ? Do not operate machinery or power tools.  Do not lift anything that is heavier than 10 lb for  5 days.  Ask your health care provider when it is okay to: ? Return to work or school. ? Resume usual physical activities or sports. ? Resume sexual activity. General instructions  If the catheter site starts to bleed, raise your arm and put firm pressure on the site. If the bleeding does not stop, get help right away. This is a medical emergency.  If you went home on the same day as your procedure, a responsible adult should be with you for the first 24 hours after you arrive home.  Keep all follow-up visits as told by your health care provider. This is important. Contact a health care provider if:  You have a fever.  You have redness, swelling, or yellow drainage around your insertion site. Get help right away if:  You have unusual pain at the radial site.  The catheter insertion area swells very fast.  The insertion area is bleeding, and the bleeding does not stop when you hold steady pressure on the area.  Your arm or hand becomes pale, cool, tingly, or numb. These symptoms may represent a serious problem that is an emergency. Do not wait to see if the symptoms will go away. Get medical help right away. Call your local emergency services (911 in the [place] Do not drive yourself to the hospital. Summary  After the procedure, it is common to have bruising and tenderness at the site.  Follow instructions  from your health care provider about how to take care of your radial site wound. Check the wound every day for signs of infection.  Do not lift anything that is heavier than 10 lb for 5 days.  This information is not intended to replace advice given to you by your health care provider. Make sure you discuss any questions you have with your health care provider. Document Revised: 11/14/2017 Document Reviewed: 11/14/2017 Elsevier Patient Education  2020 [employer].

## 2019-11-11 NOTE — Interval H&P Note (Signed)
History and Physical Interval Note:  11/11/2019 8:03 AM  [name] [name]  has presented today for surgery, with the diagnosis of Heart failure and NSTEMI.  The various methods of treatment have been discussed with the patient and family. After consideration of risks, benefits and other options for treatment, the patient has consented to  Procedure(s): RIGHT/LEFT HEART CATH AND CORONARY ANGIOGRAPHY (N/A) and possible coronary angioplasty as a surgical intervention.  The patient's history has been reviewed, patient examined, no change in status, stable for surgery.  I have reviewed the patient's chart and labs.  Questions were answered to the patient's satisfaction.     [name]

## 2019-11-16 ENCOUNTER — Other Ambulatory Visit: Payer: Self-pay | Admitting: Internal Medicine

## 2019-11-17 ENCOUNTER — Other Ambulatory Visit: Payer: Self-pay

## 2019-11-17 ENCOUNTER — Inpatient Hospital Stay: Payer: HMO

## 2019-11-17 ENCOUNTER — Telehealth: Payer: Self-pay | Admitting: Hematology

## 2019-11-17 ENCOUNTER — Inpatient Hospital Stay (HOSPITAL_BASED_OUTPATIENT_CLINIC_OR_DEPARTMENT_OTHER): Payer: HMO | Admitting: Hematology

## 2019-11-17 ENCOUNTER — Encounter: Payer: Self-pay | Admitting: Hematology

## 2019-11-17 VITALS — BP 141/61 | HR 63 | Temp 97.8°F | Resp 18 | Wt 261.0 lb

## 2019-11-17 DIAGNOSIS — C8440 Peripheral T-cell lymphoma, not classified, unspecified site: Secondary | ICD-10-CM

## 2019-11-17 DIAGNOSIS — I5020 Unspecified systolic (congestive) heart failure: Secondary | ICD-10-CM

## 2019-11-17 DIAGNOSIS — E871 Hypo-osmolality and hyponatremia: Secondary | ICD-10-CM | POA: Diagnosis not present

## 2019-11-17 DIAGNOSIS — D6481 Anemia due to antineoplastic chemotherapy: Secondary | ICD-10-CM | POA: Diagnosis not present

## 2019-11-17 DIAGNOSIS — T451X5A Adverse effect of antineoplastic and immunosuppressive drugs, initial encounter: Secondary | ICD-10-CM | POA: Diagnosis not present

## 2019-11-17 DIAGNOSIS — Z5112 Encounter for antineoplastic immunotherapy: Secondary | ICD-10-CM | POA: Diagnosis not present

## 2019-11-17 DIAGNOSIS — Z95828 Presence of other vascular implants and grafts: Secondary | ICD-10-CM

## 2019-11-17 LAB — CBC WITH DIFFERENTIAL (CANCER CENTER ONLY)
Abs Immature Granulocytes: 0.1 10*3/uL — ABNORMAL HIGH (ref 0.00–0.07)
Basophils Absolute: 0.1 10*3/uL (ref 0.0–0.1)
Basophils Relative: 2 %
Eosinophils Absolute: 0.2 10*3/uL (ref 0.0–0.5)
Eosinophils Relative: 2 %
HCT: 32.2 % — ABNORMAL LOW (ref 39.0–52.0)
Hemoglobin: 10.7 g/dL — ABNORMAL LOW (ref 13.0–17.0)
Immature Granulocytes: 1 %
Lymphocytes Relative: 7 %
Lymphs Abs: 0.7 10*3/uL (ref 0.7–4.0)
MCH: 29.2 pg (ref 26.0–34.0)
MCHC: 33.2 g/dL (ref 30.0–36.0)
MCV: 87.7 fL (ref 80.0–100.0)
Monocytes Absolute: 1.6 10*3/uL — ABNORMAL HIGH (ref 0.1–1.0)
Monocytes Relative: 17 %
Neutro Abs: 6.4 10*3/uL (ref 1.7–7.7)
Neutrophils Relative %: 71 %
Platelet Count: 322 10*3/uL (ref 150–400)
RBC: 3.67 MIL/uL — ABNORMAL LOW (ref 4.22–5.81)
RDW: 20.2 % — ABNORMAL HIGH (ref 11.5–15.5)
WBC Count: 9.1 10*3/uL (ref 4.0–10.5)
nRBC: 0 % (ref 0.0–0.2)

## 2019-11-17 LAB — CMP (CANCER CENTER ONLY)
ALT: 10 U/L (ref 0–44)
AST: 10 U/L — ABNORMAL LOW (ref 15–41)
Albumin: 3.8 g/dL (ref 3.5–5.0)
Alkaline Phosphatase: 92 U/L (ref 38–126)
Anion gap: 10 (ref 5–15)
BUN: 15 mg/dL (ref 8–23)
CO2: 26 mmol/L (ref 22–32)
Calcium: 9.4 mg/dL (ref 8.9–10.3)
Chloride: 98 mmol/L (ref 98–111)
Creatinine: 0.85 mg/dL (ref 0.61–1.24)
GFR, Est AFR Am: >60 mL/min (ref >60)
GFR, Estimated: >60 mL/min (ref >60)
Glucose, Bld: 96 mg/dL (ref 70–99)
Potassium: 4 mmol/L (ref 3.5–5.1)
Sodium: 134 mmol/L — ABNORMAL LOW (ref 135–145)
Total Bilirubin: 0.4 mg/dL (ref 0.3–1.2)
Total Protein: 7.3 g/dL (ref 6.5–8.1)

## 2019-11-17 MED ORDER — DOXORUBICIN HCL CHEMO IV INJECTION 2 MG/ML
50.0000 mg/m2 | Freq: Once | INTRAVENOUS | Status: AC
  Administered 2019-11-17: 130 mg via INTRAVENOUS
  Filled 2019-11-17: qty 65

## 2019-11-17 MED ORDER — PALONOSETRON HCL INJECTION 0.25 MG/5ML
INTRAVENOUS | Status: AC
  Filled 2019-11-17: qty 5

## 2019-11-17 MED ORDER — SODIUM CHLORIDE 0.9 % IV SOLN
Freq: Once | INTRAVENOUS | Status: AC
  Filled 2019-11-17: qty 250

## 2019-11-17 MED ORDER — SODIUM CHLORIDE 0.9% FLUSH
10.0000 mL | INTRAVENOUS | Status: DC | PRN
  Administered 2019-11-17: 10 mL
  Filled 2019-11-17: qty 10

## 2019-11-17 MED ORDER — PALONOSETRON HCL INJECTION 0.25 MG/5ML
0.2500 mg | Freq: Once | INTRAVENOUS | Status: AC
  Administered 2019-11-17: 0.25 mg via INTRAVENOUS

## 2019-11-17 MED ORDER — HEPARIN SOD (PORK) LOCK FLUSH 100 UNIT/ML IV SOLN
500.0000 [IU] | Freq: Once | INTRAVENOUS | Status: AC | PRN
  Administered 2019-11-17: 500 [IU]
  Filled 2019-11-17: qty 5

## 2019-11-17 MED ORDER — SODIUM CHLORIDE 0.9 % IV SOLN
Freq: Once | INTRAVENOUS | Status: AC
  Filled 2019-11-17: qty 5

## 2019-11-17 MED ORDER — SODIUM CHLORIDE 0.9 % IV SOLN
180.0000 mg | Freq: Once | INTRAVENOUS | Status: AC
  Administered 2019-11-17: 180 mg via INTRAVENOUS
  Filled 2019-11-17: qty 36

## 2019-11-17 MED ORDER — PEGFILGRASTIM 6 MG/0.6ML ~~LOC~~ PSKT
PREFILLED_SYRINGE | SUBCUTANEOUS | Status: AC
  Filled 2019-11-17: qty 0.6

## 2019-11-17 MED ORDER — SODIUM CHLORIDE 0.9% FLUSH
10.0000 mL | Freq: Once | INTRAVENOUS | Status: AC
  Administered 2019-11-17: 10 mL via INTRAVENOUS
  Filled 2019-11-17: qty 10

## 2019-11-17 MED ORDER — PEGFILGRASTIM 6 MG/0.6ML ~~LOC~~ PSKT
6.0000 mg | PREFILLED_SYRINGE | Freq: Once | SUBCUTANEOUS | Status: AC
  Administered 2019-11-17: 6 mg via SUBCUTANEOUS

## 2019-11-17 MED ORDER — SODIUM CHLORIDE 0.9 % IV SOLN
750.0000 mg/m2 | Freq: Once | INTRAVENOUS | Status: AC
  Administered 2019-11-17: 13:00:00 1940 mg via INTRAVENOUS
  Filled 2019-11-17: qty 97

## 2019-11-17 NOTE — Patient Instructions (Signed)
[facility] Discharge Instructions for Patients Receiving Chemotherapy  Today you received the following chemotherapy agents Adriamycin, Adcetris, Cytoxan  To help prevent nausea and vomiting after your treatment, we encourage you to take your nausea medication    If you develop nausea and vomiting that is not controlled by your nausea medication, call the clinic.   BELOW ARE SYMPTOMS THAT SHOULD BE REPORTED IMMEDIATELY:  *FEVER GREATER THAN 100.5 F  *CHILLS WITH OR WITHOUT FEVER  NAUSEA AND VOMITING THAT IS NOT CONTROLLED WITH YOUR NAUSEA MEDICATION  *UNUSUAL SHORTNESS OF BREATH  *UNUSUAL BRUISING OR BLEEDING  TENDERNESS IN MOUTH AND THROAT WITH OR WITHOUT PRESENCE OF ULCERS  *URINARY PROBLEMS  *BOWEL PROBLEMS  UNUSUAL RASH Items with * indicate a potential emergency and should be followed up as soon as possible.  Feel free to call the clinic should you have any questions or concerns. The clinic phone number is [phone].  Please show the [facility] at check-in to the Emergency Department and triage nurse.

## 2019-11-17 NOTE — Progress Notes (Signed)
[place] OFFICE PROGRESS NOTE  Patient Care Team: Colon Branch, MD as PCP - General [name], [name], MD as PCP - Cardiology (Cardiology) [name], [name], MD as Consulting Physician (Cardiology) [name], MD as Consulting Physician (Gastroenterology) [name], MD as Consulting Physician (Orthopedic Surgery) [name], MD as Consulting Physician (Physical Medicine and Rehabilitation) Luretha Rued, RN as [place] Management [name], MD as Consulting Physician (Otolaryngology) Tish Men, MD as Medical Oncologist (Oncology) Cordelia Poche, RN as Oncology Nurse Navigator  HEME/ONC OVERVIEW: 1. Stage IV angioimmunoblastic T-cell lymphoma -07/2019: L neck LN FNA non-diagnostic  -08/2019:   Multiple enlarged cervical, mediastinal, bulky retroperitoneal LN's (~5cm), and irregular RLL nodule (1.6x1.1cm) on CT   Excisional left cervical LN bx, path showed angioimmunoblastic T-cell lymphoma, CD30+ (at least 25%)  FDG-avid LN's in the neck, chest and abdomen/pelvis; no bone involvement  Small lymphoid aggregates on the bone marrow biopsy, suspicious for lymphoma involvement   LVEF 89-38%, Grade I diastolic dysfunction  -07/1750 - present: Adcetris + CHP w/ Onpro  TREATMENT SUMMARY:  09/30/2019 - present: Adcetris + CHP w/ Onpro  PERTINENT NON-HEM/ONC PROBLEMS: 1. Transient acute systolic heart failure -11/5850: admitted for acute systolic heart failure exacerbation; LVEF 45-50%, troponin I peaked at 862 -10/2019: repeat TTE showed LVEF 60-65%, c/w transient ischemia; LHC showed mild LAD stenosis (30%), no stent required, LVEF 60-65%   ASSESSMENT & PLAN:   Stage IV angioimmunoblastic T-cell lymphoma -S/p 2 cycle of CHP + Adcetris; doxorubicin omitted for 2nd cycle due to new onset systolic heart failure  -Patient tolerated the treatment well -No evidence of volume overload on exam today  -I had a very lengthy discussion  with Dr. [name] of cardiology, who performed left heart cath that showed only mild LAD stenosis (~30%).  No stent placement was required.  He felt that the benefit of resuming anthracycline likely outweigh the risk, and there is no contraindication from cardiology standpoint to resume anthracycline. -Labs reviewed and adequate, proceed with Cycle 3 of treatment; doxorubicin resumed at the original dose  -We will plan to repeat echocardiogram after Cycle 4 of treatment to monitor for any interval changes  -Plan for 6 cycles (up to 8 cycles in the trial) -PRN anti-emetics: Zofran, Compazine, and Ativan   Transient acute systolic heart failure -LVEF 60 to 65% on repeat echocardiogram in 10/2018, confirmed on LHC  -No evidence of volume overload on exam today -As discussed above, it was felt to be safe to resume anthracycline from cardiology standpoint -We will plan to repeat echocardiogram after Cycle 4 of treatment to monitor for any interval changes  Chemotherapy-associated anemia -Secondary to chemotherapy, as well as anemia of chronic disease and hemodilution -Hgb 10.7, stable  -Patient denies any symptom of bleeding -We will monitor for now  Hyponatremia -Etiology unclear; chronic, mild  -Na 134 today, fluctuating but overall stable  -Patient is currently not taking Lasix  -No evidence of volume overload on exam -I encouraged the patient to maintain adequate hydration  No orders of the defined types were placed in this encounter.  All questions were answered. The patient knows to call the clinic with any problems, questions or concerns. No barriers to learning was detected.  Return in 3 weeks for Cycle 4 of chemotherapy.   Tish Men, MD 1/25/202110:25 AM  CHIEF COMPLAINT: "I am doing fine"  INTERVAL HISTORY: [name] [name] returns clinic for follow-up of Stage IV angioimmunoblastic T-cell lymphoma on chemotherapy.  Patient  reports that he tolerated left heart cath well, and  denies any abnormal bleeding after the procedure.  He is eating and drinking well, and his bowel movement is regular and soft.  He denies any fever, night sweats, weight change, or recurrent lymphadenopathy.  He denies any other complaint today.  REVIEW OF SYSTEMS:   Constitutional: ( - ) fevers, ( - )  chills , ( - ) night sweats Eyes: ( - ) blurriness of vision, ( - ) double vision, ( - ) watery eyes Ears, nose, mouth, throat, and face: ( - ) mucositis, ( - ) sore throat Respiratory: ( - ) cough, ( - ) dyspnea, ( - ) wheezes Cardiovascular: ( - ) palpitation, ( - ) chest discomfort, ( - ) lower extremity swelling Gastrointestinal:  ( - ) nausea, ( - ) heartburn, ( - ) change in bowel habits Skin: ( - ) abnormal skin rashes Lymphatics: ( - ) new lymphadenopathy, ( - ) easy bruising Neurological: ( - ) numbness, ( - ) tingling, ( - ) new weaknesses Behavioral/Psych: ( - ) mood change, ( - ) new changes  All other systems were reviewed with the patient and are negative.  SUMMARY OF ONCOLOGIC HISTORY: Oncology History  Angioimmunoblastic lymphoma ([place])  08/25/2019 Imaging   CT neck: IMPRESSION: Right greater than left cervical and supraclavicular adenopathy with some nodes demonstrating potential cystic change or necrosis. This may be infectious/inflammatory or neoplastic and tissue sampling is recommended.   08/25/2019 Imaging   CT chest: IMPRESSION: Multiple areas of adenopathy involving the lower neck, left axilla, subcarinal station, and largest areas of nodal masses throughout the visualized retroperitoneum. Splenomegaly.   Scattered inflammatory stranding adjacent to the distal pancreatic body-tail. 11 cm lobulated cyst in the left kidney.   1.6 and 1.1 cm irregular nodules in the medial basilar segment of the right lower lobe.   09/01/2019 Pathology Results   FINAL MICROSCOPIC DIAGNOSIS:   A. LYMPH NODE, RIGHT SUPRACLAVICULAR, EXCISION:  - Angioimmunoblastic T-cell  lymphoma.   COMMENT:   Sections reveal soft tissue with mixed inflammation and foreign body  giant cell reaction with focally entrapped abnormal lymph nodes. The  abnormal lymph nodes are effaced with numerous medium sized atypical  lymphocytes with clear cytoplasm. There are admixed eosinophils, plasma  cells, and smaller lymphocytes. There is increased vascularity. These  atypical cells extend into the surrounding tissue with the mixed soft  tissue reaction. The abnormal lymphocytes are positive for CD2, CD3,  CD4, PD1, CXCL13, bcl-6, bcl-2, and CD10. CD21 and CD23 highlight  disrupted follicular dendritic networks. There is weak CD30 expression  (25%). They are negative for CD7, CD8, CD15, and CD56. CD138 highlights  plasma cells that are polytypic by light chain in situ hybridization.  EBV in situ hybridization is negative in the B-cell and T-cells. CD20  highlights residual cortical B-cells. GMS, AFB, and PAS are negative for  organisms. Flow cytometry ([id number]) reveals abundant T-cells with  decreased/absent CD7. PCR for T-cell receptor gamma gene rearrangement  is positive. Overall, these findings are consistent with involvement by  angioimmunoblastic T-cell lymphoma. A preliminary diagnosis was called  to Dr. Erik Obey on 81/07/3158. Dr. Maylon Peppers was paged on 09/19/19 for the  final diagnosis.    09/08/2019 Initial Diagnosis   T-cell lymphoma ([place])   09/17/2019 Imaging   Pet: IMPRESSION: 1. Active lymphoma within the neck, chest, abdomen, and pelvis, as detailed above. ([place]) 4 2. Development of small bilateral pleural effusions and abdominal ascites, possibly related  to fluid overload. 3. Coronary artery atherosclerosis. Aortic Atherosclerosis (ICD10-I70.0).   09/30/2019 -  Chemotherapy   The patient had DOXOrubicin (ADRIAMYCIN) chemo injection 130 mg, 50 mg/m2 = 130 mg, Intravenous,  Once, 2 of 5 cycles Administration: 130 mg (09/30/2019) palonosetron (ALOXI)  injection 0.25 mg, 0.25 mg, Intravenous,  Once, 3 of 6 cycles Administration: 0.25 mg (09/30/2019), 0.25 mg (10/27/2019) pegfilgrastim (NEULASTA ONPRO KIT) injection 6 mg, 6 mg, Subcutaneous, Once, 3 of 6 cycles Administration: 6 mg (09/30/2019), 6 mg (10/27/2019) cyclophosphamide (CYTOXAN) 1,940 mg in sodium chloride 0.9 % 250 mL chemo infusion, 750 mg/m2 = 1,940 mg, Intravenous,  Once, 3 of 6 cycles Administration: 1,940 mg (09/30/2019), 1,940 mg (10/27/2019) brentuximab vedotin (ADCETRIS) 135 mg in sodium chloride 0.9 % 100 mL chemo infusion, 135 mg (75 % of original dose 180 mg), Intravenous,  Once, 3 of 6 cycles Dose modification: 180 mg (original dose 180 mg, Cycle 1, Reason: Other (see comments), Comment: Maximum dose Adcetris ), 135 mg (original dose 180 mg, Cycle 1, Reason: Change in SCr/CrCl) Administration: 135 mg (09/30/2019), 180 mg (10/27/2019) fosaprepitant (EMEND) 150 mg, dexamethasone (DECADRON) 12 mg in sodium chloride 0.9 % 145 mL IVPB, , Intravenous,  Once, 3 of 6 cycles Administration:  (09/30/2019),  (10/27/2019)  for chemotherapy treatment.      I have reviewed the past medical history, past surgical history, social history and family history with the patient and they are unchanged from previous note.  ALLERGIES:  is allergic to doxycycline.  MEDICATIONS:  Current Outpatient Medications  Medication Sig Dispense Refill  . allopurinol (ZYLOPRIM) 300 MG tablet Take 1 tablet (300 mg total) by mouth daily. 30 tablet 3  . carvedilol (COREG) 12.5 MG tablet Take 1 tablet (12.5 mg total) by mouth 2 (two) times daily with a meal. 60 tablet 6  . hydrochlorothiazide (HYDRODIURIL) 25 MG tablet Take 1 tablet (25 mg total) by mouth daily. 90 tablet 1  . LORazepam (ATIVAN) 0.5 MG tablet Take 1 tablet (0.5 mg total) by mouth every 6 (six) hours as needed (Nausea or vomiting). 30 tablet 0  . losartan (COZAAR) 100 MG tablet Take 1 tablet (100 mg total) by mouth daily. 90 tablet 1  . metFORMIN  (GLUCOPHAGE) 500 MG tablet Take 1 tablet (500 mg total) by mouth daily with breakfast. 90 tablet 1  . ondansetron (ZOFRAN) 8 MG tablet Take 1 tablet (8 mg total) by mouth 2 (two) times daily as needed for refractory nausea / vomiting. Start on day 3 after cyclophosphamide chemotherapy. 30 tablet 1  . potassium chloride (KLOR-CON M10) 10 MEQ tablet Take 1 tablet (10 mEq total) by mouth daily. 90 tablet 1  . predniSONE (DELTASONE) 20 MG tablet Take 100 mg by mouth See admin instructions. Take 100 mg daily for 5 days on days 1 through 5 of chemotherapy    . prochlorperazine (COMPAZINE) 10 MG tablet Take 1 tablet (10 mg total) by mouth every 6 (six) hours as needed (Nausea or vomiting). 30 tablet 6  . furosemide (LASIX) 40 MG tablet Take 1 tablet (40 mg total) by mouth daily as needed for fluid or edema. (Patient not taking: Reported on 11/10/2019) 30 tablet 11  . HYDROcodone-acetaminophen (NORCO) 7.5-325 MG tablet Take 1 tablet by mouth 3 (three) times daily as needed for moderate pain. (Patient not taking: Reported on 11/10/2019) 60 tablet 0  . Nutritional Supplements (GLUCERNA 1.0 CAL/CARBSTEADY) LIQD Take 1 Can by mouth daily. Okay to drink up to 3 cans/bottles daily to replace meals  as needed. (Patient not taking: Reported on 11/07/2019) 21330 mL 6  . Omega-3 Fatty Acids (FISH OIL) 1000 MG CAPS Take 1,000 mg by mouth daily.    . simvastatin (ZOCOR) 80 MG tablet Take 0.5 tablets (40 mg total) by mouth daily. (Patient not taking: Reported on 11/10/2019) 45 tablet 1   No current facility-administered medications for this visit.   Facility-Administered Medications Ordered in Other Visits  Medication Dose Route Frequency Provider Last Rate Last Admin  . brentuximab vedotin (ADCETRIS) 180 mg in sodium chloride 0.9 % 100 mL chemo infusion  180 mg Intravenous Once Tish Men, MD      . cyclophosphamide (CYTOXAN) 1,940 mg in sodium chloride 0.9 % 250 mL chemo infusion  750 mg/m2 (Treatment Plan Recorded)  Intravenous Once Tish Men, MD      . DOXOrubicin (ADRIAMYCIN) chemo injection 130 mg  50 mg/m2 (Treatment Plan Recorded) Intravenous Once Tish Men, MD      . fosaprepitant (EMEND) 150 mg, dexamethasone (DECADRON) 12 mg in sodium chloride 0.9 % 145 mL IVPB   Intravenous Once Tish Men, MD      . heparin lock flush 100 unit/mL  500 Units Intracatheter Once PRN Tish Men, MD      . pegfilgrastim (NEULASTA ONPRO KIT) injection 6 mg  6 mg Subcutaneous Once Tish Men, MD      . sodium chloride flush (NS) 0.9 % injection 10 mL  10 mL Intracatheter PRN Tish Men, MD        PHYSICAL EXAMINATION: ECOG PERFORMANCE STATUS: 2 - Symptomatic, <50% confined to bed  Today's Vitals   11/17/19 0922 11/17/19 0923  BP: (!) 141/61   Pulse: 63   Resp: 18   Temp: 97.8 F (36.6 C)   TempSrc: Temporal   SpO2: 100%   Weight: 261 lb (118.4 kg)   PainSc:  0-No pain   Body mass index is 35.4 kg/m.  Filed Weights   11/17/19 0922  Weight: 261 lb (118.4 kg)    GENERAL: alert, no distress and comfortable SKIN: skin color, texture, turgor are normal, no rashes or significant lesions EYES: conjunctiva are pink and non-injected, sclera clear OROPHARYNX: no exudate, no erythema; lips, buccal mucosa, and tongue normal  NECK: supple, non-tender LYMPH:  no palpable lymphadenopathy in the cervical LUNGS: clear to auscultation with normal breathing effort HEART: regular rate & rhythm and no murmurs and no lower extremity edema ABDOMEN: soft, non-tender, non-distended, normal bowel sounds Musculoskeletal: no cyanosis of digits and no clubbing  PSYCH: alert & oriented x 3, fluent speech  LABORATORY DATA:  I have reviewed the data as listed    Component Value Date/Time   NA 134 (L) 11/17/2019 0920   K 4.0 11/17/2019 0920   CL 98 11/17/2019 0920   CO2 26 11/17/2019 0920   GLUCOSE 96 11/17/2019 0920   GLUCOSE 101 (H) 08/30/2006 1117   BUN 15 11/17/2019 0920   CREATININE 0.85 11/17/2019 0920   CREATININE 1.41  (H) 06/06/2019 1528   CALCIUM 9.4 11/17/2019 0920   PROT 7.3 11/17/2019 0920   ALBUMIN 3.8 11/17/2019 0920   AST 10 (L) 11/17/2019 0920   ALT 10 11/17/2019 0920   ALKPHOS 92 11/17/2019 0920   BILITOT 0.4 11/17/2019 0920   GFRNONAA >60 11/17/2019 0920   GFRAA >60 11/17/2019 0920    No results found for: SPEP, UPEP  Lab Results  Component Value Date   WBC 9.1 11/17/2019   NEUTROABS 6.4 11/17/2019   HGB 10.7 (L) 11/17/2019  HCT 32.2 (L) 11/17/2019   MCV 87.7 11/17/2019   PLT 322 11/17/2019      Chemistry      Component Value Date/Time   NA 134 (L) 11/17/2019 0920   K 4.0 11/17/2019 0920   CL 98 11/17/2019 0920   CO2 26 11/17/2019 0920   BUN 15 11/17/2019 0920   CREATININE 0.85 11/17/2019 0920   CREATININE 1.41 (H) 06/06/2019 1528      Component Value Date/Time   CALCIUM 9.4 11/17/2019 0920   ALKPHOS 92 11/17/2019 0920   AST 10 (L) 11/17/2019 0920   ALT 10 11/17/2019 0920   BILITOT 0.4 11/17/2019 0920       RADIOGRAPHIC STUDIES: I have personally reviewed the radiological images as listed below and agreed with the findings in the report. CARDIAC CATHETERIZATION  Result Date: 11/11/2019  Mid LAD lesion is 30% stenosed.  Findings: Ao = 122/64 (89) LV =  133/7 RA =  1 RV = 17/2 PA =  16/2 (10) PCW = 7 Fick cardiac output/index = 9.9/4.2 PVR = < 1.0 WU FA sat = 98% PA sat = 75%, 77% Assessment: 1. Mild coronary artery plaquing 2. LVEF 60-65% 3. Normal hemodynamics Plan/Discussion: No evidence of obstructive CAD. EF normal. Can continue with modified CHOP regimen for lymphoma including doxorubicin. Recheck echo after 2 more rounds. D/w Oncology team. Glori Bickers, MD 8:55 AM   ECHOCARDIOGRAM COMPLETE  Result Date: 11/07/2019   ECHOCARDIOGRAM LIMITED REPORT   Patient Name:   [name] [name] Date of Exam: 11/07/2019 Medical Rec #:  [id number]     Height:       72.0 in Accession #:    [id number]    Weight:       256.0 lb Date of Birth:  05/01/1941     BSA:          2.37 m  Patient Age:    79 years      BP:           156/79 mmHg Patient Gender: M             HR:           92 bpm. Exam Location:  [facility]  Procedure: 2D Echo, Cardiac Doppler, Color Doppler and Strain Analysis Indications:    Cardiomyopathy-Unspecified 425.9 / I42.9                 Chemo V67.2 / Z09                  New onset systolic heart failure,                 evaluateLVEF  History:        Patient has prior history of Echocardiogram examinations, most                 recent 10/09/2019.  Sonographer:    Cardell Peach RDCS (AE) Referring Phys: [id number] [place]  1. Left ventricular ejection fraction, by visual estimation, is 60 to 65%. The left ventricle has normal function. Left ventricular septal wall thickness was moderately increased. Moderately increased left ventricular posterior wall thickness. There is moderately increased left ventricular wall thickness.  2. Left ventricular diastolic parameters are consistent with Grade I diastolic dysfunction (impaired relaxation).  3. The left ventricle is not well visualized.  4. Global right ventricle has normal systolc function.The right ventricular size is mildly enlarged. no increase in right ventricular wall thickness.  5. The mitral  valve is normal in structure. No evidence of mitral valve regurgitation. No evidence of mitral stenosis.  6. The tricuspid valve was normal in structure. Tricuspid valve regurgitation is trivial.  7. Tricuspid valve regurgitation is trivial.  8. Mild aortic valve sclerosis without stenosis.  9. The pulmonic valve was normal in structure. 10. 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 11. Mildly elevated pulmonary artery systolic pressure. 12. The inferior vena cava is normal in size with greater than 50% respiratory variability, suggesting right atrial pressure of 3 mmHg. FINDINGS  Left Ventricle: Left ventricular ejection fraction, by visual estimation, is 60 to 65%. The left ventricle has normal  function. The left ventricle is not well visualized. The left ventricular internal cavity size was the left ventricle is normal in size. Left ventricular septal wall thickness was moderately increased. Moderately increased left ventricular posterior wall thickness. There is moderately increased left ventricular wall thickness. Concentric remodeling left ventricular hypertrophy. Left  ventricular diastolic parameters are consistent with Grade I diastolic dysfunction (impaired relaxation). Indeterminate filling pressures. Right Ventricle: The right ventricular size is mildly enlarged. No increase in right ventricular wall thickness. Global RV systolic function is has normal systolic function. The tricuspid regurgitant velocity is 2.71 m/s, and with an assumed right atrial  pressure of 3 mmHg, the estimated right ventricular systolic pressure is mildly elevated at 32.4 mmHg. Left Atrium: Left atrial size was normal in size. Right Atrium: Right atrial size was normal in size. Right atrial pressure is estimated at 3 mmHg. Pericardium: There is no evidence of pericardial effusion is seen. There is no evidence of pericardial effusion. Mitral Valve: The mitral valve is normal in structure. No evidence of mitral valve stenosis by observation. MV Area by PHT, 3.83 cm. MV PHT, 57.42 msec. No evidence of mitral valve regurgitation. Tricuspid Valve: The tricuspid valve is normal in structure. Tricuspid valve regurgitation is trivial. Aortic Valve: The aortic valve is tricuspid. Aortic valve regurgitation is mild. Aortic regurgitation PHT measures 364 msec. Mild aortic valve sclerosis is present, with no evidence of aortic valve stenosis. Pulmonic Valve: The pulmonic valve was normal in structure. Pulmonic valve regurgitation is not visualized by color flow Doppler. Pulmonic regurgitation is not visualized by color flow Doppler. No evidence of pulmonic stenosis. Aorta: The aortic root, ascending aorta and aortic arch are all  structurally normal, with no evidence of dilitation or obstruction. 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 Venous: The pulmonary veins were not well visualized. The inferior vena cava is normal in size with greater than 50% respiratory variability, suggesting right atrial pressure of 3 mmHg. Shunts: There is no evidence of a patent foramen ovale. No ventricular septal defect is seen or detected. There is no evidence of an atrial septal defect. No atrial level shunt detected by color flow Doppler.  LEFT VENTRICLE          Normals PLAX 2D LVIDd:         4.74 cm  3.6 cm       Diastology                 Normals LVIDs:         2.84 cm  1.7 cm       LV e' lateral:   6.31 cm/s 6.42 cm/s LV PW:         1.46 cm  1.4 cm       LV E/e' lateral: 11.1  15.4 LV IVS:        1.40 cm  1.3 cm       LV e' medial:    3.15 cm/s 6.96 cm/s LVOT diam:     2.00 cm  2.0 cm       LV E/e' medial:  22.3      6.96 LV SV:         74 ml    79 ml LV SV Index:   29.85    45 ml/m2     2D Longitudinal Strain LVOT Area:     3.14 cm 3.14 cm2     2D Strain GLS Avg:     -13.4 %  LV Volumes (MOD)             Normals LV area d, A2C:    36.40 cm LV area d, A4C:    30.80 cm LV area s, A2C:    20.80 cm LV area s, A4C:    15.50 cm LV major d, A2C:   8.58 cm LV major d, A4C:   8.02 cm LV major s, A2C:   6.78 cm LV major s, A4C:   5.57 cm LV vol d, MOD A2C: 126.0 ml  68 ml LV vol d, MOD A4C: 96.7 ml LV vol s, MOD A2C: 58.0 ml   24 ml LV vol s, MOD A4C: 37.2 ml LV SV MOD A2C:     68.0 ml LV SV MOD A4C:     96.7 ml LV SV MOD BP:      63.1 ml   45 ml RIGHT VENTRICLE          IVC RV Basal diam:  4.31 cm  IVC diam: 1.43 cm TAPSE (M-mode): 2.5 cm LEFT ATRIUM         Index      RIGHT ATRIUM           Index LA diam:    3.70 cm 1.56 cm/m RA Area:     15.90 cm                                RA Volume:   33.10 ml  13.99 ml/m  AORTIC VALVE             Normals LVOT Vmax:   134.00 cm/s LVOT Vmean:  96.700 cm/s 75 cm/s LVOT VTI:    0.220  m     25.3 cm AI PHT:      364 msec  AORTA                 Normals Ao Root diam: 3.20 cm 31 mm Ao Asc diam:  4.00 cm 31 mm MITRAL VALVE              Normals    TRICUSPID VALVE             Normals MV Area (PHT): 3.83 cm              TR Peak grad:   29.4 mmHg MV PHT:        57.42 msec 55 ms      TR Vmax:        271.00 cm/s 288 cm/s MV Decel Time: 198 msec   187 ms MV E velocity: 70.30 cm/s  103 cm/s  SHUNTS MV A velocity: 101.00 cm/s 70.3 cm/s Systemic VTI:  0.22 m MV E/A ratio:  0.70  1.5       Systemic Diam: 2.00 cm  Shirlee More MD Electronically signed by Shirlee More MD Signature Date/Time: 11/07/2019/3:21:20 PMThe mitral valve is normal in structure.    Final

## 2019-11-17 NOTE — Progress Notes (Signed)
Labs reviewed by Dr. Zhao.  Ok to treat today.  

## 2019-11-17 NOTE — Telephone Encounter (Signed)
Appointments scheduled calendar   Printed per 1/25 los

## 2019-11-18 ENCOUNTER — Other Ambulatory Visit: Payer: Self-pay | Admitting: Internal Medicine

## 2019-11-20 ENCOUNTER — Other Ambulatory Visit: Payer: Self-pay | Admitting: Internal Medicine

## 2019-11-26 DIAGNOSIS — J9691 Respiratory failure, unspecified with hypoxia: Secondary | ICD-10-CM | POA: Diagnosis not present

## 2019-11-26 DIAGNOSIS — E669 Obesity, unspecified: Secondary | ICD-10-CM | POA: Diagnosis not present

## 2019-11-26 DIAGNOSIS — D630 Anemia in neoplastic disease: Secondary | ICD-10-CM | POA: Diagnosis not present

## 2019-11-26 DIAGNOSIS — Z7984 Long term (current) use of oral hypoglycemic drugs: Secondary | ICD-10-CM | POA: Diagnosis not present

## 2019-11-26 DIAGNOSIS — I110 Hypertensive heart disease with heart failure: Secondary | ICD-10-CM | POA: Diagnosis not present

## 2019-11-26 DIAGNOSIS — Z9181 History of falling: Secondary | ICD-10-CM | POA: Diagnosis not present

## 2019-11-26 DIAGNOSIS — I5020 Unspecified systolic (congestive) heart failure: Secondary | ICD-10-CM | POA: Diagnosis not present

## 2019-11-26 DIAGNOSIS — M1990 Unspecified osteoarthritis, unspecified site: Secondary | ICD-10-CM | POA: Diagnosis not present

## 2019-11-26 DIAGNOSIS — C8440 Peripheral T-cell lymphoma, not classified, unspecified site: Secondary | ICD-10-CM | POA: Diagnosis not present

## 2019-11-26 DIAGNOSIS — E119 Type 2 diabetes mellitus without complications: Secondary | ICD-10-CM | POA: Diagnosis not present

## 2019-11-26 DIAGNOSIS — M48061 Spinal stenosis, lumbar region without neurogenic claudication: Secondary | ICD-10-CM | POA: Diagnosis not present

## 2019-11-26 DIAGNOSIS — Z7982 Long term (current) use of aspirin: Secondary | ICD-10-CM | POA: Diagnosis not present

## 2019-11-26 DIAGNOSIS — Z96611 Presence of right artificial shoulder joint: Secondary | ICD-10-CM | POA: Diagnosis not present

## 2019-12-02 DIAGNOSIS — Z7984 Long term (current) use of oral hypoglycemic drugs: Secondary | ICD-10-CM | POA: Diagnosis not present

## 2019-12-02 DIAGNOSIS — Z7982 Long term (current) use of aspirin: Secondary | ICD-10-CM | POA: Diagnosis not present

## 2019-12-02 DIAGNOSIS — E119 Type 2 diabetes mellitus without complications: Secondary | ICD-10-CM | POA: Diagnosis not present

## 2019-12-02 DIAGNOSIS — J9691 Respiratory failure, unspecified with hypoxia: Secondary | ICD-10-CM | POA: Diagnosis not present

## 2019-12-02 DIAGNOSIS — I110 Hypertensive heart disease with heart failure: Secondary | ICD-10-CM | POA: Diagnosis not present

## 2019-12-02 DIAGNOSIS — I5020 Unspecified systolic (congestive) heart failure: Secondary | ICD-10-CM | POA: Diagnosis not present

## 2019-12-02 DIAGNOSIS — M1990 Unspecified osteoarthritis, unspecified site: Secondary | ICD-10-CM | POA: Diagnosis not present

## 2019-12-02 DIAGNOSIS — E669 Obesity, unspecified: Secondary | ICD-10-CM | POA: Diagnosis not present

## 2019-12-02 DIAGNOSIS — C8440 Peripheral T-cell lymphoma, not classified, unspecified site: Secondary | ICD-10-CM | POA: Diagnosis not present

## 2019-12-02 DIAGNOSIS — M48061 Spinal stenosis, lumbar region without neurogenic claudication: Secondary | ICD-10-CM | POA: Diagnosis not present

## 2019-12-02 DIAGNOSIS — Z96611 Presence of right artificial shoulder joint: Secondary | ICD-10-CM | POA: Diagnosis not present

## 2019-12-02 DIAGNOSIS — Z9181 History of falling: Secondary | ICD-10-CM | POA: Diagnosis not present

## 2019-12-02 DIAGNOSIS — D630 Anemia in neoplastic disease: Secondary | ICD-10-CM | POA: Diagnosis not present

## 2019-12-03 ENCOUNTER — Ambulatory Visit: Payer: HMO | Admitting: Cardiovascular Disease

## 2019-12-04 ENCOUNTER — Other Ambulatory Visit: Payer: Self-pay

## 2019-12-04 ENCOUNTER — Ambulatory Visit: Payer: HMO

## 2019-12-04 NOTE — Patient Outreach (Signed)
  [place] [facility]) Care Management Chronic Special Needs Program    12/04/2019  Name: [name] [name], DOB: 1941-05-19  MRN: [id number]   [name] is enrolled in a chronic special needs plan.  RNCM called to complete health risk assessment and update individualized care plan. RNCM spoke with client, two client identifiers confirmed. Client reports he is feeling much better. Client reports this is not a good time to talk and request to rescheduled call.  Plan: telephonic outreach rescheduled for next week.  Thea Silversmith, RN, MSN, [facility] [phone]

## 2019-12-05 DIAGNOSIS — L570 Actinic keratosis: Secondary | ICD-10-CM | POA: Diagnosis not present

## 2019-12-05 DIAGNOSIS — L821 Other seborrheic keratosis: Secondary | ICD-10-CM | POA: Diagnosis not present

## 2019-12-05 DIAGNOSIS — L578 Other skin changes due to chronic exposure to nonionizing radiation: Secondary | ICD-10-CM | POA: Diagnosis not present

## 2019-12-08 ENCOUNTER — Inpatient Hospital Stay: Payer: HMO

## 2019-12-08 ENCOUNTER — Encounter: Payer: Self-pay | Admitting: Hematology

## 2019-12-08 ENCOUNTER — Other Ambulatory Visit: Payer: Self-pay

## 2019-12-08 ENCOUNTER — Inpatient Hospital Stay: Payer: HMO | Attending: Hematology

## 2019-12-08 ENCOUNTER — Inpatient Hospital Stay (HOSPITAL_BASED_OUTPATIENT_CLINIC_OR_DEPARTMENT_OTHER): Payer: HMO | Admitting: Hematology

## 2019-12-08 ENCOUNTER — Encounter: Payer: Self-pay | Admitting: *Deleted

## 2019-12-08 VITALS — BP 122/74 | HR 67 | Temp 97.2°F | Resp 18 | Ht 72.0 in | Wt 266.0 lb

## 2019-12-08 DIAGNOSIS — R110 Nausea: Secondary | ICD-10-CM | POA: Diagnosis not present

## 2019-12-08 DIAGNOSIS — I5021 Acute systolic (congestive) heart failure: Secondary | ICD-10-CM | POA: Insufficient documentation

## 2019-12-08 DIAGNOSIS — Z5112 Encounter for antineoplastic immunotherapy: Secondary | ICD-10-CM | POA: Insufficient documentation

## 2019-12-08 DIAGNOSIS — Z5111 Encounter for antineoplastic chemotherapy: Secondary | ICD-10-CM | POA: Insufficient documentation

## 2019-12-08 DIAGNOSIS — I5020 Unspecified systolic (congestive) heart failure: Secondary | ICD-10-CM

## 2019-12-08 DIAGNOSIS — C8440 Peripheral T-cell lymphoma, not classified, unspecified site: Secondary | ICD-10-CM

## 2019-12-08 DIAGNOSIS — T451X5A Adverse effect of antineoplastic and immunosuppressive drugs, initial encounter: Secondary | ICD-10-CM | POA: Diagnosis not present

## 2019-12-08 DIAGNOSIS — D6481 Anemia due to antineoplastic chemotherapy: Secondary | ICD-10-CM | POA: Diagnosis not present

## 2019-12-08 DIAGNOSIS — Z5189 Encounter for other specified aftercare: Secondary | ICD-10-CM | POA: Insufficient documentation

## 2019-12-08 LAB — CBC WITH DIFFERENTIAL (CANCER CENTER ONLY)
Abs Immature Granulocytes: 0.11 10*3/uL — ABNORMAL HIGH (ref 0.00–0.07)
Basophils Absolute: 0.1 10*3/uL (ref 0.0–0.1)
Basophils Relative: 2 %
Eosinophils Absolute: 0.1 10*3/uL (ref 0.0–0.5)
Eosinophils Relative: 1 %
HCT: 31.5 % — ABNORMAL LOW (ref 39.0–52.0)
Hemoglobin: 10.6 g/dL — ABNORMAL LOW (ref 13.0–17.0)
Immature Granulocytes: 1 %
Lymphocytes Relative: 7 %
Lymphs Abs: 0.6 10*3/uL — ABNORMAL LOW (ref 0.7–4.0)
MCH: 30.2 pg (ref 26.0–34.0)
MCHC: 33.7 g/dL (ref 30.0–36.0)
MCV: 89.7 fL (ref 80.0–100.0)
Monocytes Absolute: 1.7 10*3/uL — ABNORMAL HIGH (ref 0.1–1.0)
Monocytes Relative: 19 %
Neutro Abs: 6.2 10*3/uL (ref 1.7–7.7)
Neutrophils Relative %: 70 %
Platelet Count: 383 10*3/uL (ref 150–400)
RBC: 3.51 MIL/uL — ABNORMAL LOW (ref 4.22–5.81)
RDW: 20.5 % — ABNORMAL HIGH (ref 11.5–15.5)
WBC Count: 8.8 10*3/uL (ref 4.0–10.5)
nRBC: 0 % (ref 0.0–0.2)

## 2019-12-08 LAB — CMP (CANCER CENTER ONLY)
ALT: 9 U/L (ref 0–44)
AST: 11 U/L — ABNORMAL LOW (ref 15–41)
Albumin: 3.9 g/dL (ref 3.5–5.0)
Alkaline Phosphatase: 79 U/L (ref 38–126)
Anion gap: 8 (ref 5–15)
BUN: 14 mg/dL (ref 8–23)
CO2: 26 mmol/L (ref 22–32)
Calcium: 9.6 mg/dL (ref 8.9–10.3)
Chloride: 100 mmol/L (ref 98–111)
Creatinine: 0.84 mg/dL (ref 0.61–1.24)
GFR, Est AFR Am: >60 mL/min (ref >60)
GFR, Estimated: >60 mL/min (ref >60)
Glucose, Bld: 102 mg/dL — ABNORMAL HIGH (ref 70–99)
Potassium: 4.1 mmol/L (ref 3.5–5.1)
Sodium: 134 mmol/L — ABNORMAL LOW (ref 135–145)
Total Bilirubin: 0.3 mg/dL (ref 0.3–1.2)
Total Protein: 6.8 g/dL (ref 6.5–8.1)

## 2019-12-08 MED ORDER — SODIUM CHLORIDE 0.9% FLUSH
10.0000 mL | INTRAVENOUS | Status: DC | PRN
  Administered 2019-12-08: 10 mL
  Filled 2019-12-08: qty 10

## 2019-12-08 MED ORDER — SODIUM CHLORIDE 0.9 % IV SOLN
Freq: Once | INTRAVENOUS | Status: AC
  Filled 2019-12-08: qty 5

## 2019-12-08 MED ORDER — PEGFILGRASTIM 6 MG/0.6ML ~~LOC~~ PSKT
6.0000 mg | PREFILLED_SYRINGE | Freq: Once | SUBCUTANEOUS | Status: AC
  Administered 2019-12-08: 6 mg via SUBCUTANEOUS

## 2019-12-08 MED ORDER — SODIUM CHLORIDE 0.9 % IV SOLN
Freq: Once | INTRAVENOUS | Status: AC
  Filled 2019-12-08: qty 250

## 2019-12-08 MED ORDER — HEPARIN SOD (PORK) LOCK FLUSH 100 UNIT/ML IV SOLN
500.0000 [IU] | Freq: Once | INTRAVENOUS | Status: AC | PRN
  Administered 2019-12-08: 500 [IU]
  Filled 2019-12-08: qty 5

## 2019-12-08 MED ORDER — SODIUM CHLORIDE 0.9 % IV SOLN
180.0000 mg | Freq: Once | INTRAVENOUS | Status: AC
  Administered 2019-12-08: 180 mg via INTRAVENOUS
  Filled 2019-12-08: qty 36

## 2019-12-08 MED ORDER — PEGFILGRASTIM 6 MG/0.6ML ~~LOC~~ PSKT
PREFILLED_SYRINGE | SUBCUTANEOUS | Status: AC
  Filled 2019-12-08: qty 0.6

## 2019-12-08 MED ORDER — DOXORUBICIN HCL CHEMO IV INJECTION 2 MG/ML
50.0000 mg/m2 | Freq: Once | INTRAVENOUS | Status: AC
  Administered 2019-12-08: 130 mg via INTRAVENOUS
  Filled 2019-12-08: qty 65

## 2019-12-08 MED ORDER — PALONOSETRON HCL INJECTION 0.25 MG/5ML
0.2500 mg | Freq: Once | INTRAVENOUS | Status: AC
  Administered 2019-12-08: 0.25 mg via INTRAVENOUS

## 2019-12-08 MED ORDER — SODIUM CHLORIDE 0.9 % IV SOLN
750.0000 mg/m2 | Freq: Once | INTRAVENOUS | Status: AC
  Administered 2019-12-08: 1940 mg via INTRAVENOUS
  Filled 2019-12-08: qty 97

## 2019-12-08 NOTE — Progress Notes (Signed)
[place] OFFICE PROGRESS NOTE  Patient Care Team: Colon Branch, MD as PCP - General [name], [name], MD as PCP - Cardiology (Cardiology) [name], [name], MD as Consulting Physician (Cardiology) [name], MD as Consulting Physician (Gastroenterology) [name], MD as Consulting Physician (Orthopedic Surgery) [name], MD as Consulting Physician (Physical Medicine and Rehabilitation) Luretha Rued, RN as [place] Management [name], MD as Consulting Physician (Otolaryngology) Tish Men, MD as Medical Oncologist (Oncology) Cordelia Poche, RN as Oncology Nurse Navigator  HEME/ONC OVERVIEW: 1. Stage IV angioimmunoblastic T-cell lymphoma -07/2019: L neck LN FNA non-diagnostic  -08/2019:   Multiple enlarged cervical, mediastinal, bulky retroperitoneal LN's (~5cm), and irregular RLL nodule (1.6x1.1cm) on CT   Excisional left cervical LN bx, path showed angioimmunoblastic T-cell lymphoma, CD30+ (at least 25%)  FDG-avid LN's in the neck, chest and abdomen/pelvis; no bone involvement  Small lymphoid aggregates on the bone marrow biopsy, suspicious for lymphoma involvement   LVEF 16-38%, Grade I diastolic dysfunction  -45/3646 - present: Adcetris + CHP w/ Onpro  TREATMENT SUMMARY:  09/30/2019 - present: Adcetris + CHP w/ Onpro  PERTINENT NON-HEM/ONC PROBLEMS: 1. Transient acute systolic heart failure -80/3212: admitted for acute systolic heart failure exacerbation; LVEF 45-50%, troponin I peaked at 862 -10/2019: repeat TTE showed LVEF 60-65%, c/w transient ischemia; LHC showed mild LAD stenosis (30%), no stent required, LVEF 60-65%   ASSESSMENT & PLAN:   Stage IV angioimmunoblastic T-cell lymphoma -S/p 3 cycle of CHP + Adcetris; doxorubicin omitted for Cycle 2 due to new onset systolic heart failure  -Patient is tolerating the treatment well since resuming doxorubicin with Cycle 3 of treatment  -No evidence of volume  overload on exam today  -Labs reviewed and adequate, proceed with Cycle 4 of treatment -I have ordered follow-up echocardiogram after Cycle 4 of treatment to monitor for any interval changes  -In addition, I have ordered PET scan to assess interim disease response.  If the disease is responding appropriately, we will plan for a total of 6 cycles of chemotherapy.  -PRN anti-emetics: Zofran, Compazine, and Ativan   Transient acute systolic heart failure -LVEF 60 to 65% on repeat echocardiogram in 10/2018, confirmed on LHC  -No evidence of volume overload on exam today -As discussed above, it was felt to be safe to resume anthracycline from cardiology standpoint -Follow-up echocardiogram after Cycle 4 of chemotherapy as above   Chemotherapy-associated anemia -Secondary to chemotherapy, as well as anemia of chronic disease and hemodilution -Hgb 10.6, stable  -Patient denies any symptom of bleeding -We will monitor for now  Chemotherapy-associated nausea  -Secondary to chemotherapy -Symptoms relatively well controlled  -Continue PRN-anti-emetics   Orders Placed This Encounter  Procedures  . NM PET Image Restag (PS) Skull Base To Thigh    Standing Status:   Future    Standing Expiration Date:   12/07/2020    Order Specific Question:   ** REASON FOR EXAM (FREE TEXT)    Answer:   Angioimmunoblastic T-cell lymphoma, assess interim resposne    Order Specific Question:   If indicated for the ordered procedure, I authorize the administration of a radiopharmaceutical per Radiology protocol    Answer:   Yes    Order Specific Question:   Preferred imaging location?    Answer:   [facility]    Order Specific Question:   Radiology Contrast Protocol - do NOT remove file path    Answer:   _0 [email or web address]  . Lactate  dehydrogenase    Standing Status:   Standing    Number of Occurrences:   20    Standing Expiration Date:   12/07/2020  . ECHOCARDIOGRAM COMPLETE     Standing Status:   Future    Standing Expiration Date:   03/06/2021    Order Specific Question:   Where should this test be performed    Answer:   [facility]    Order Specific Question:   Perflutren DEFINITY (image enhancing agent) should be administered unless hypersensitivity or allergy exist    Answer:   Administer Perflutren    Order Specific Question:   Reason for exam-Echo    Answer:   Chemotherapy evaluation  v87.41 / v58.11    Order Specific Question:   Other Comments    Answer:   Recent transient systolic heart failure, assess LVEF   All questions were answered. The patient knows to call the clinic with any problems, questions or concerns. No barriers to learning was detected.  Return in 3 weeks for Cycle 5 of chemotherapy and clinic appt.   Tish Men, MD 2/15/202112:01 PM  CHIEF COMPLAINT: "I am doing fine"  INTERVAL HISTORY: [name] returns to clinic for follow-up of Stage IV angioimmunoblastic T-cell lymphoma on CHOP + Adecetris.  Patient reports that he had mild intermittent nausea after the last cycle of chemotherapy, for which he took antiemetics with improvement.  He denies any vomiting.  He is otherwise tolerating the treatment very well, and denies any fever, chill, night sweats, lymphadenopathy, chest pain, dyspnea, abdominal pain, diarrhea, or lower extremity swelling.  His appetite is good.  He denies any other complaint today.  REVIEW OF SYSTEMS:   Constitutional: ( - ) fevers, ( - )  chills , ( - ) night sweats Eyes: ( - ) blurriness of vision, ( - ) double vision, ( - ) watery eyes Ears, nose, mouth, throat, and face: ( - ) mucositis, ( - ) sore throat Respiratory: ( - ) cough, ( - ) dyspnea, ( - ) wheezes Cardiovascular: ( - ) palpitation, ( - ) chest discomfort, ( - ) lower extremity swelling Gastrointestinal:  ( + ) nausea, ( - ) heartburn, ( - ) change in bowel habits Skin: ( - ) abnormal skin rashes Lymphatics: ( - ) new lymphadenopathy, ( - )  easy bruising Neurological: ( - ) numbness, ( - ) tingling, ( - ) new weaknesses Behavioral/Psych: ( - ) mood change, ( - ) new changes  All other systems were reviewed with the patient and are negative.  SUMMARY OF ONCOLOGIC HISTORY: Oncology History  Angioimmunoblastic lymphoma ([place])  08/25/2019 Imaging   CT neck: IMPRESSION: Right greater than left cervical and supraclavicular adenopathy with some nodes demonstrating potential cystic change or necrosis. This may be infectious/inflammatory or neoplastic and tissue sampling is recommended.   08/25/2019 Imaging   CT chest: IMPRESSION: Multiple areas of adenopathy involving the lower neck, left axilla, subcarinal station, and largest areas of nodal masses throughout the visualized retroperitoneum. Splenomegaly.   Scattered inflammatory stranding adjacent to the distal pancreatic body-tail. 11 cm lobulated cyst in the left kidney.   1.6 and 1.1 cm irregular nodules in the medial basilar segment of the right lower lobe.   09/01/2019 Pathology Results   FINAL MICROSCOPIC DIAGNOSIS:   A. LYMPH NODE, RIGHT SUPRACLAVICULAR, EXCISION:  - Angioimmunoblastic T-cell lymphoma.   COMMENT:   Sections reveal soft tissue with mixed inflammation and foreign body  giant cell reaction with  focally entrapped abnormal lymph nodes. The  abnormal lymph nodes are effaced with numerous medium sized atypical  lymphocytes with clear cytoplasm. There are admixed eosinophils, plasma  cells, and smaller lymphocytes. There is increased vascularity. These  atypical cells extend into the surrounding tissue with the mixed soft  tissue reaction. The abnormal lymphocytes are positive for CD2, CD3,  CD4, PD1, CXCL13, bcl-6, bcl-2, and CD10. CD21 and CD23 highlight  disrupted follicular dendritic networks. There is weak CD30 expression  (25%). They are negative for CD7, CD8, CD15, and CD56. CD138 highlights  plasma cells that are polytypic by light chain in  situ hybridization.  EBV in situ hybridization is negative in the B-cell and T-cells. CD20  highlights residual cortical B-cells. GMS, AFB, and PAS are negative for  organisms. Flow cytometry ([id number]) reveals abundant T-cells with  decreased/absent CD7. PCR for T-cell receptor gamma gene rearrangement  is positive. Overall, these findings are consistent with involvement by  angioimmunoblastic T-cell lymphoma. A preliminary diagnosis was called  to Dr. Erik Obey on 18/84/1660. Dr. Maylon Peppers was paged on 09/19/19 for the  final diagnosis.    09/08/2019 Initial Diagnosis   T-cell lymphoma ([place])   09/17/2019 Imaging   Pet: IMPRESSION: 1. Active lymphoma within the neck, chest, abdomen, and pelvis, as detailed above. ([place]) 4 2. Development of small bilateral pleural effusions and abdominal ascites, possibly related to fluid overload. 3. Coronary artery atherosclerosis. Aortic Atherosclerosis (ICD10-I70.0).   09/30/2019 -  Chemotherapy   The patient had DOXOrubicin (ADRIAMYCIN) chemo injection 130 mg, 50 mg/m2 = 130 mg, Intravenous,  Once, 2 of 5 cycles Administration: 130 mg (09/30/2019), 130 mg (11/17/2019) palonosetron (ALOXI) injection 0.25 mg, 0.25 mg, Intravenous,  Once, 3 of 6 cycles Administration: 0.25 mg (09/30/2019), 0.25 mg (10/27/2019), 0.25 mg (11/17/2019) pegfilgrastim (NEULASTA ONPRO KIT) injection 6 mg, 6 mg, Subcutaneous, Once, 3 of 6 cycles Administration: 6 mg (09/30/2019), 6 mg (10/27/2019), 6 mg (11/17/2019) cyclophosphamide (CYTOXAN) 1,940 mg in sodium chloride 0.9 % 250 mL chemo infusion, 750 mg/m2 = 1,940 mg, Intravenous,  Once, 3 of 6 cycles Administration: 1,940 mg (09/30/2019), 1,940 mg (10/27/2019), 1,940 mg (11/17/2019) brentuximab vedotin (ADCETRIS) 135 mg in sodium chloride 0.9 % 100 mL chemo infusion, 135 mg (75 % of original dose 180 mg), Intravenous,  Once, 3 of 6 cycles Dose modification: 180 mg (original dose 180 mg, Cycle 1, Reason: Other (see comments), Comment:  Maximum dose Adcetris ), 135 mg (original dose 180 mg, Cycle 1, Reason: Change in SCr/CrCl) Administration: 135 mg (09/30/2019), 180 mg (10/27/2019), 180 mg (11/17/2019) fosaprepitant (EMEND) 150 mg, dexamethasone (DECADRON) 12 mg in sodium chloride 0.9 % 145 mL IVPB, , Intravenous,  Once, 3 of 6 cycles Administration:  (09/30/2019),  (10/27/2019),  (11/17/2019)  for chemotherapy treatment.      I have reviewed the past medical history, past surgical history, social history and family history with the patient and they are unchanged from previous note.  ALLERGIES:  is allergic to doxycycline.  MEDICATIONS:  Current Outpatient Medications  Medication Sig Dispense Refill  . allopurinol (ZYLOPRIM) 300 MG tablet Take 1 tablet (300 mg total) by mouth daily. 30 tablet 3  . carvedilol (COREG) 12.5 MG tablet Take 1 tablet (12.5 mg total) by mouth 2 (two) times daily with a meal. 60 tablet 6  . hydrochlorothiazide (HYDRODIURIL) 25 MG tablet Take 1 tablet (25 mg total) by mouth daily. 90 tablet 1  . losartan (COZAAR) 100 MG tablet Take 1 tablet (100 mg total) by mouth daily. [place]  tablet 1  . potassium chloride (KLOR-CON M10) 10 MEQ tablet Take 1 tablet (10 mEq total) by mouth daily. 90 tablet 1  . furosemide (LASIX) 40 MG tablet Take 1 tablet (40 mg total) by mouth daily as needed for fluid or edema. (Patient not taking: Reported on 11/10/2019) 30 tablet 11  . HYDROcodone-acetaminophen (NORCO) 7.5-325 MG tablet Take 1 tablet by mouth 3 (three) times daily as needed for moderate pain. (Patient not taking: Reported on 11/10/2019) 60 tablet 0  . LORazepam (ATIVAN) 0.5 MG tablet Take 1 tablet (0.5 mg total) by mouth every 6 (six) hours as needed (Nausea or vomiting). (Patient not taking: Reported on 12/08/2019) 30 tablet 0  . metFORMIN (GLUCOPHAGE) 500 MG tablet Take 1 tablet (500 mg total) by mouth daily with breakfast. (Patient not taking: Reported on 12/08/2019) 90 tablet 1  . Nutritional Supplements (GLUCERNA 1.0  CAL/CARBSTEADY) LIQD Take 1 Can by mouth daily. Okay to drink up to 3 cans/bottles daily to replace meals as needed. (Patient not taking: Reported on 11/07/2019) 21330 mL 6  . Omega-3 Fatty Acids (FISH OIL) 1000 MG CAPS Take 1,000 mg by mouth daily.    . ondansetron (ZOFRAN) 8 MG tablet Take 1 tablet (8 mg total) by mouth 2 (two) times daily as needed for refractory nausea / vomiting. Start on day 3 after cyclophosphamide chemotherapy. (Patient not taking: Reported on 12/08/2019) 30 tablet 1  . predniSONE (DELTASONE) 20 MG tablet Take 100 mg by mouth See admin instructions. Take 100 mg daily for 5 days on days 1 through 5 of chemotherapy    . prochlorperazine (COMPAZINE) 10 MG tablet Take 1 tablet (10 mg total) by mouth every 6 (six) hours as needed (Nausea or vomiting). (Patient not taking: Reported on 12/08/2019) 30 tablet 6  . promethazine (PHENERGAN) 12.5 MG tablet TAKE 1 TABLET (12.5 MG TOTAL) BY MOUTH EVERY 6 (SIX) HOURS AS NEEDED FOR NAUSEA OR VOMITING. (Patient not taking: Reported on 12/08/2019) 30 tablet 0  . simvastatin (ZOCOR) 80 MG tablet Take 0.5 tablets (40 mg total) by mouth daily. (Patient not taking: Reported on 11/10/2019) 45 tablet 1   No current facility-administered medications for this visit.    PHYSICAL EXAMINATION: ECOG PERFORMANCE STATUS: 2 - Symptomatic, <50% confined to bed  Today's Vitals   12/08/19 1151  BP: 122/74  Pulse: 67  Resp: 18  Temp: (!) 97.2 F (36.2 C)  TempSrc: Temporal  SpO2: 100%  Weight: 266 lb (120.7 kg)  Height: 6' (1.829 m)  PainSc: 0-No pain   Body mass index is 36.08 kg/m.  Filed Weights   12/08/19 1151  Weight: 266 lb (120.7 kg)    GENERAL: alert, no distress and comfortable SKIN: skin color, texture, turgor are normal, no rashes or significant lesions EYES: conjunctiva are pink and non-injected, sclera clear OROPHARYNX: no exudate, no erythema; lips, buccal mucosa, and tongue normal  NECK: supple, non-tender LYMPH:  no palpable  lymphadenopathy in the cervical LUNGS: clear to auscultation with normal breathing effort HEART: regular rate & rhythm and no murmurs and no lower extremity edema ABDOMEN: soft, non-tender, non-distended, normal bowel sounds Musculoskeletal: no cyanosis of digits and no clubbing  PSYCH: alert & oriented x 3, fluent speech  LABORATORY DATA:  I have reviewed the data as listed    Component Value Date/Time   NA 134 (L) 11/17/2019 0920   K 4.0 11/17/2019 0920   CL 98 11/17/2019 0920   CO2 26 11/17/2019 0920   GLUCOSE 96 11/17/2019 0920  GLUCOSE 101 (H) 08/30/2006 1117   BUN 15 11/17/2019 0920   CREATININE 0.85 11/17/2019 0920   CREATININE 1.41 (H) 06/06/2019 1528   CALCIUM 9.4 11/17/2019 0920   PROT 7.3 11/17/2019 0920   ALBUMIN 3.8 11/17/2019 0920   AST 10 (L) 11/17/2019 0920   ALT 10 11/17/2019 0920   ALKPHOS 92 11/17/2019 0920   BILITOT 0.4 11/17/2019 0920   GFRNONAA >60 11/17/2019 0920   GFRAA >60 11/17/2019 0920    No results found for: SPEP, UPEP  Lab Results  Component Value Date   WBC 8.8 12/08/2019   NEUTROABS 6.2 12/08/2019   HGB 10.6 (L) 12/08/2019   HCT 31.5 (L) 12/08/2019   MCV 89.7 12/08/2019   PLT 383 12/08/2019      Chemistry      Component Value Date/Time   NA 134 (L) 11/17/2019 0920   K 4.0 11/17/2019 0920   CL 98 11/17/2019 0920   CO2 26 11/17/2019 0920   BUN 15 11/17/2019 0920   CREATININE 0.85 11/17/2019 0920   CREATININE 1.41 (H) 06/06/2019 1528      Component Value Date/Time   CALCIUM 9.4 11/17/2019 0920   ALKPHOS 92 11/17/2019 0920   AST 10 (L) 11/17/2019 0920   ALT 10 11/17/2019 0920   BILITOT 0.4 11/17/2019 0920       RADIOGRAPHIC STUDIES: I have personally reviewed the radiological images as listed below and agreed with the findings in the report. CARDIAC CATHETERIZATION  Result Date: 11/11/2019  Mid LAD lesion is 30% stenosed.  Findings: Ao = 122/64 (89) LV =  133/7 RA =  1 RV = 17/2 PA =  16/2 (10) PCW = 7 Fick cardiac  output/index = 9.9/4.2 PVR = < 1.0 WU FA sat = 98% PA sat = 75%, 77% Assessment: 1. Mild coronary artery plaquing 2. LVEF 60-65% 3. Normal hemodynamics Plan/Discussion: No evidence of obstructive CAD. EF normal. Can continue with modified CHOP regimen for lymphoma including doxorubicin. Recheck echo after 2 more rounds. D/w Oncology team. Glori Bickers, MD 8:55 AM

## 2019-12-08 NOTE — Progress Notes (Signed)
Patient here for follow up. He will need a PET and ECHO prior to next treatment cycle.  Appointments scheduled. Patient given a copy of appointment calendar. Patient also given and reviewed written instruction for PET preperation.

## 2019-12-08 NOTE — Patient Instructions (Signed)
[facility] Discharge Instructions for Patients Receiving Chemotherapy  Today you received the following chemotherapy agents Adriamycin, Adcetris, Cytoxan  To help prevent nausea and vomiting after your treatment, we encourage you to take your nausea medication    If you develop nausea and vomiting that is not controlled by your nausea medication, call the clinic.   BELOW ARE SYMPTOMS THAT SHOULD BE REPORTED IMMEDIATELY:  *FEVER GREATER THAN 100.5 F  *CHILLS WITH OR WITHOUT FEVER  NAUSEA AND VOMITING THAT IS NOT CONTROLLED WITH YOUR NAUSEA MEDICATION  *UNUSUAL SHORTNESS OF BREATH  *UNUSUAL BRUISING OR BLEEDING  TENDERNESS IN MOUTH AND THROAT WITH OR WITHOUT PRESENCE OF ULCERS  *URINARY PROBLEMS  *BOWEL PROBLEMS  UNUSUAL RASH Items with * indicate a potential emergency and should be followed up as soon as possible.  Feel free to call the clinic should you have any questions or concerns. The clinic phone number is [phone].  Please show the [place] at check-in to the Emergency Department and triage nurse.

## 2019-12-08 NOTE — Patient Instructions (Signed)

## 2019-12-09 ENCOUNTER — Ambulatory Visit: Payer: Self-pay

## 2019-12-10 ENCOUNTER — Other Ambulatory Visit: Payer: Self-pay

## 2019-12-10 NOTE — Patient Outreach (Signed)
  [facility]) Care Management Chronic Special Needs Program    12/10/2019  Name: BRITTNEY REMUND, DOB: Dec 10, 1940  MRN: [id number]   Mr. Raees At[place]ah is enrolled in a chronic special needs plan for Diabetes. RNCM called to complete health risk assessment and update care plan. No answer. HIPAA compliant message left. 2nd outreach attempt. Plan: RNCM will outreach within the next 1-2 weeks.  Thea Silversmith, RN, MSN, [place] [phone]

## 2019-12-15 ENCOUNTER — Ambulatory Visit: Payer: Self-pay

## 2019-12-16 DIAGNOSIS — C8440 Peripheral T-cell lymphoma, not classified, unspecified site: Secondary | ICD-10-CM | POA: Diagnosis not present

## 2019-12-16 DIAGNOSIS — I110 Hypertensive heart disease with heart failure: Secondary | ICD-10-CM | POA: Diagnosis not present

## 2019-12-16 DIAGNOSIS — E119 Type 2 diabetes mellitus without complications: Secondary | ICD-10-CM | POA: Diagnosis not present

## 2019-12-16 DIAGNOSIS — Z9181 History of falling: Secondary | ICD-10-CM | POA: Diagnosis not present

## 2019-12-16 DIAGNOSIS — E669 Obesity, unspecified: Secondary | ICD-10-CM | POA: Diagnosis not present

## 2019-12-16 DIAGNOSIS — Z7982 Long term (current) use of aspirin: Secondary | ICD-10-CM | POA: Diagnosis not present

## 2019-12-16 DIAGNOSIS — D630 Anemia in neoplastic disease: Secondary | ICD-10-CM | POA: Diagnosis not present

## 2019-12-16 DIAGNOSIS — Z7984 Long term (current) use of oral hypoglycemic drugs: Secondary | ICD-10-CM | POA: Diagnosis not present

## 2019-12-16 DIAGNOSIS — M1990 Unspecified osteoarthritis, unspecified site: Secondary | ICD-10-CM | POA: Diagnosis not present

## 2019-12-16 DIAGNOSIS — M48061 Spinal stenosis, lumbar region without neurogenic claudication: Secondary | ICD-10-CM | POA: Diagnosis not present

## 2019-12-16 DIAGNOSIS — J9691 Respiratory failure, unspecified with hypoxia: Secondary | ICD-10-CM | POA: Diagnosis not present

## 2019-12-16 DIAGNOSIS — I5020 Unspecified systolic (congestive) heart failure: Secondary | ICD-10-CM | POA: Diagnosis not present

## 2019-12-16 DIAGNOSIS — Z96611 Presence of right artificial shoulder joint: Secondary | ICD-10-CM | POA: Diagnosis not present

## 2019-12-19 ENCOUNTER — Other Ambulatory Visit: Payer: Self-pay | Admitting: Internal Medicine

## 2019-12-19 ENCOUNTER — Other Ambulatory Visit: Payer: Self-pay

## 2019-12-19 NOTE — Patient Outreach (Signed)
  [facility]) Care Management Chronic Special Needs Program  12/19/2019  Name: [name] [name] DOB: 1940-11-29  MRN: [id number]  Mr. Sepehr Plyler is enrolled in a chronic special needs plan.. Chronic Care Management Coordinator telephoned client to complete health risk assessment and to update individualized care plan.  RNCM called with no answer. RNCM has made 3 unsuccessful outreach attempts. Per policy/procedure, RNCM will complete care plan based on available data.  New diagnosis of congestive heart failure noted 09/2019. Client increased to Tier 2 and changed to Heart failure program. Client will receive quarterly education on both diabetes and heart failure.   Goals Addressed            This Visit's Progress   . Client verbalize knowledge of Heart Failure disease self management actions within the next 6 months.   On track    Heart failure self-management actions: Know signs and symptoms of congestive heart failure exacerbation. Know when to call the doctor to who to call. Verbalize how fluid intake can affect congestive heart failure. Eat healthy and monitor salt intake. Visit your primary care or cardiologist as scheduled. Weight daily and record. Mailed education: "What is Heart failure?". Please review and call your RN care management coordinator [phone]) if you have any questions.    . Client will verbalize knowledge of self management of Hypertension as evidences by BP reading of 140/90 or less; or as defined by provider   On track    Take medications as prescribed. Follow up with your provider visits as scheduled. Ask your doctor "what is my target blood pressure range".  Monitor your blood pressure and take results to your doctor's appointment.  Mailed education: Diabetes and blood pressure. Please review and call if you have any questions.    . COMPLETED: General - Client will not be readmitted within 30 days (C-SNP)       Discharge date  10/20/2019: no readmissions 12/19/2019       . HEMOGLOBIN A1C < 7       A1C 6.1 on 09/23/2019  Diabetes self management actions:  Glucose monitoring per provider recommendations  Eat Healthy  Check feet daily  Visit provider every 3-6 months as directed  Hbg A1C level every 3-6 months.  Eye Exam yearly  Ask your doctor, "what is my Target A1C goal?"  Ask your doctor, "what is my Target blood sugar range?"     . Obtain annual  Lipid Profile, LDL-C   On track    Done 10/14/2019 -  goal renewed year 2021  It is important to follow up with your provider as scheduled for recommended labs.    . Obtain Annual Eye (retinal)  Exam    On track    Unable to find information: Goal renewed  It is important to attend provider visits as scheduled for annual exam.    . Obtain annual screen for micro albuminuria (urine) , nephropathy (kidney problems)   On track    Unable to find information: goal renewed year 2021.  It is important to attend scheduled provider visits to obtain recommended labs/procedures. This is a test that looks at how your kidneys are working.       Plan: RNCM will send updated care plan to client; send updated care plan to primary care. Send Neurosurgeon. RNCM will outreach per next tier level in 6 months.    Thea Silversmith, RN, MSN, [place] [phone]

## 2019-12-22 ENCOUNTER — Ambulatory Visit (HOSPITAL_BASED_OUTPATIENT_CLINIC_OR_DEPARTMENT_OTHER)
Admission: RE | Admit: 2019-12-22 | Discharge: 2019-12-22 | Disposition: A | Discharge status: Home / Self Care | Payer: HMO | Source: Ambulatory Visit | Attending: Hematology | Admitting: Hematology

## 2019-12-22 ENCOUNTER — Other Ambulatory Visit: Payer: Self-pay

## 2019-12-22 DIAGNOSIS — I5020 Unspecified systolic (congestive) heart failure: Secondary | ICD-10-CM | POA: Diagnosis not present

## 2019-12-22 DIAGNOSIS — Z0189 Encounter for other specified special examinations: Secondary | ICD-10-CM | POA: Diagnosis not present

## 2019-12-22 DIAGNOSIS — C8440 Peripheral T-cell lymphoma, not classified, unspecified site: Secondary | ICD-10-CM | POA: Insufficient documentation

## 2019-12-22 NOTE — Progress Notes (Signed)
  Echocardiogram 2D Echocardiogram has been performed.  Cardell Peach 12/22/2019, 4:04 PM

## 2019-12-24 ENCOUNTER — Ambulatory Visit (HOSPITAL_COMMUNITY)
Admission: RE | Admit: 2019-12-24 | Discharge: 2019-12-24 | Disposition: A | Discharge status: Home / Self Care | Payer: HMO | Source: Ambulatory Visit | Attending: Hematology | Admitting: Hematology

## 2019-12-24 ENCOUNTER — Other Ambulatory Visit: Payer: Self-pay

## 2019-12-24 DIAGNOSIS — I2510 Atherosclerotic heart disease of native coronary artery without angina pectoris: Secondary | ICD-10-CM | POA: Insufficient documentation

## 2019-12-24 DIAGNOSIS — C8440 Peripheral T-cell lymphoma, not classified, unspecified site: Secondary | ICD-10-CM | POA: Diagnosis not present

## 2019-12-24 DIAGNOSIS — I700 Atherosclerosis of aorta: Secondary | ICD-10-CM | POA: Diagnosis not present

## 2019-12-24 DIAGNOSIS — C8590 Non-Hodgkin lymphoma, unspecified, unspecified site: Secondary | ICD-10-CM | POA: Diagnosis not present

## 2019-12-24 LAB — GLUCOSE, CAPILLARY: Glucose-Capillary: 115 mg/dL — ABNORMAL HIGH (ref 70–99)

## 2019-12-24 MED ORDER — FLUDEOXYGLUCOSE F - 18 (FDG) INJECTION
12.4500 | Freq: Once | INTRAVENOUS | Status: AC | PRN
  Administered 2019-12-24: 12.45 via INTRAVENOUS

## 2019-12-26 ENCOUNTER — Other Ambulatory Visit: Payer: Self-pay | Admitting: Hematology

## 2019-12-26 DIAGNOSIS — C8440 Peripheral T-cell lymphoma, not classified, unspecified site: Secondary | ICD-10-CM

## 2019-12-29 ENCOUNTER — Other Ambulatory Visit: Payer: Self-pay

## 2019-12-29 ENCOUNTER — Encounter: Payer: Self-pay | Admitting: Hematology

## 2019-12-29 ENCOUNTER — Inpatient Hospital Stay (HOSPITAL_BASED_OUTPATIENT_CLINIC_OR_DEPARTMENT_OTHER): Payer: HMO | Admitting: Hematology

## 2019-12-29 ENCOUNTER — Inpatient Hospital Stay: Payer: HMO

## 2019-12-29 ENCOUNTER — Encounter: Payer: Self-pay | Admitting: *Deleted

## 2019-12-29 ENCOUNTER — Inpatient Hospital Stay: Payer: HMO | Attending: Hematology

## 2019-12-29 ENCOUNTER — Telehealth: Payer: Self-pay | Admitting: Hematology

## 2019-12-29 VITALS — BP 132/63 | HR 69 | Temp 96.9°F | Resp 18 | Ht 72.0 in | Wt 268.0 lb

## 2019-12-29 DIAGNOSIS — Z5189 Encounter for other specified aftercare: Secondary | ICD-10-CM | POA: Diagnosis not present

## 2019-12-29 DIAGNOSIS — R110 Nausea: Secondary | ICD-10-CM | POA: Diagnosis not present

## 2019-12-29 DIAGNOSIS — C8440 Peripheral T-cell lymphoma, not classified, unspecified site: Secondary | ICD-10-CM

## 2019-12-29 DIAGNOSIS — Z5111 Encounter for antineoplastic chemotherapy: Secondary | ICD-10-CM | POA: Insufficient documentation

## 2019-12-29 DIAGNOSIS — D6481 Anemia due to antineoplastic chemotherapy: Secondary | ICD-10-CM | POA: Diagnosis not present

## 2019-12-29 DIAGNOSIS — T451X5A Adverse effect of antineoplastic and immunosuppressive drugs, initial encounter: Secondary | ICD-10-CM | POA: Diagnosis not present

## 2019-12-29 DIAGNOSIS — Z5112 Encounter for antineoplastic immunotherapy: Secondary | ICD-10-CM | POA: Insufficient documentation

## 2019-12-29 LAB — CMP (CANCER CENTER ONLY)
ALT: 8 U/L (ref 0–44)
AST: 11 U/L — ABNORMAL LOW (ref 15–41)
Albumin: 4.2 g/dL (ref 3.5–5.0)
Alkaline Phosphatase: 80 U/L (ref 38–126)
Anion gap: 9 (ref 5–15)
BUN: 10 mg/dL (ref 8–23)
CO2: 26 mmol/L (ref 22–32)
Calcium: 9.3 mg/dL (ref 8.9–10.3)
Chloride: 99 mmol/L (ref 98–111)
Creatinine: 0.94 mg/dL (ref 0.61–1.24)
GFR, Est AFR Am: >60 mL/min (ref >60)
GFR, Estimated: >60 mL/min (ref >60)
Glucose, Bld: 105 mg/dL — ABNORMAL HIGH (ref 70–99)
Potassium: 3.8 mmol/L (ref 3.5–5.1)
Sodium: 134 mmol/L — ABNORMAL LOW (ref 135–145)
Total Bilirubin: 0.4 mg/dL (ref 0.3–1.2)
Total Protein: 7.1 g/dL (ref 6.5–8.1)

## 2019-12-29 LAB — CBC WITH DIFFERENTIAL (CANCER CENTER ONLY)
Abs Immature Granulocytes: 0.06 10*3/uL (ref 0.00–0.07)
Basophils Absolute: 0.1 10*3/uL (ref 0.0–0.1)
Basophils Relative: 2 %
Eosinophils Absolute: 0.1 10*3/uL (ref 0.0–0.5)
Eosinophils Relative: 1 %
HCT: 32 % — ABNORMAL LOW (ref 39.0–52.0)
Hemoglobin: 10.8 g/dL — ABNORMAL LOW (ref 13.0–17.0)
Immature Granulocytes: 1 %
Lymphocytes Relative: 9 %
Lymphs Abs: 0.6 10*3/uL — ABNORMAL LOW (ref 0.7–4.0)
MCH: 31.3 pg (ref 26.0–34.0)
MCHC: 33.8 g/dL (ref 30.0–36.0)
MCV: 92.8 fL (ref 80.0–100.0)
Monocytes Absolute: 1.3 10*3/uL — ABNORMAL HIGH (ref 0.1–1.0)
Monocytes Relative: 19 %
Neutro Abs: 4.9 10*3/uL (ref 1.7–7.7)
Neutrophils Relative %: 68 %
Platelet Count: 382 10*3/uL (ref 150–400)
RBC: 3.45 MIL/uL — ABNORMAL LOW (ref 4.22–5.81)
RDW: 19.4 % — ABNORMAL HIGH (ref 11.5–15.5)
WBC Count: 7 10*3/uL (ref 4.0–10.5)
nRBC: 0 % (ref 0.0–0.2)

## 2019-12-29 LAB — LACTATE DEHYDROGENASE: LDH: 195 U/L — ABNORMAL HIGH (ref 98–192)

## 2019-12-29 MED ORDER — SODIUM CHLORIDE 0.9% FLUSH
10.0000 mL | INTRAVENOUS | Status: DC | PRN
  Administered 2019-12-29: 10 mL
  Filled 2019-12-29: qty 10

## 2019-12-29 MED ORDER — PALONOSETRON HCL INJECTION 0.25 MG/5ML
INTRAVENOUS | Status: AC
  Filled 2019-12-29: qty 5

## 2019-12-29 MED ORDER — PEGFILGRASTIM 6 MG/0.6ML ~~LOC~~ PSKT
PREFILLED_SYRINGE | SUBCUTANEOUS | Status: AC
  Filled 2019-12-29: qty 0.6

## 2019-12-29 MED ORDER — SODIUM CHLORIDE 0.9 % IV SOLN
Freq: Once | INTRAVENOUS | Status: AC
  Filled 2019-12-29: qty 250

## 2019-12-29 MED ORDER — DOXORUBICIN HCL CHEMO IV INJECTION 2 MG/ML
50.0000 mg/m2 | Freq: Once | INTRAVENOUS | Status: AC
  Administered 2019-12-29: 130 mg via INTRAVENOUS
  Filled 2019-12-29: qty 65

## 2019-12-29 MED ORDER — PEGFILGRASTIM 6 MG/0.6ML ~~LOC~~ PSKT
6.0000 mg | PREFILLED_SYRINGE | Freq: Once | SUBCUTANEOUS | Status: AC
  Administered 2019-12-29: 6 mg via SUBCUTANEOUS

## 2019-12-29 MED ORDER — SODIUM CHLORIDE 0.9 % IV SOLN
750.0000 mg/m2 | Freq: Once | INTRAVENOUS | Status: AC
  Administered 2019-12-29: 1940 mg via INTRAVENOUS
  Filled 2019-12-29: qty 97

## 2019-12-29 MED ORDER — SODIUM CHLORIDE 0.9 % IV SOLN
180.0000 mg | Freq: Once | INTRAVENOUS | Status: AC
  Administered 2019-12-29: 180 mg via INTRAVENOUS
  Filled 2019-12-29: qty 36

## 2019-12-29 MED ORDER — SODIUM CHLORIDE 0.9 % IV SOLN
Freq: Once | INTRAVENOUS | Status: AC
  Filled 2019-12-29: qty 5

## 2019-12-29 MED ORDER — HEPARIN SOD (PORK) LOCK FLUSH 100 UNIT/ML IV SOLN
500.0000 [IU] | Freq: Once | INTRAVENOUS | Status: AC | PRN
  Administered 2019-12-29: 500 [IU]
  Filled 2019-12-29: qty 5

## 2019-12-29 MED ORDER — PALONOSETRON HCL INJECTION 0.25 MG/5ML
0.2500 mg | Freq: Once | INTRAVENOUS | Status: AC
  Administered 2019-12-29: 0.25 mg via INTRAVENOUS

## 2019-12-29 NOTE — Progress Notes (Signed)
[facility] OFFICE PROGRESS NOTE  Patient Care Team: Colon Branch, MD as PCP - General [name], [name], MD as PCP - Cardiology (Cardiology) [name], [name], MD as Consulting Physician (Cardiology) [name], MD as Consulting Physician (Gastroenterology) [name], MD as Consulting Physician (Orthopedic Surgery) [name], MD as Consulting Physician (Physical Medicine and Rehabilitation) Luretha Rued, RN as [place] Management [name], MD as Consulting Physician (Otolaryngology) Tish Men, MD as Medical Oncologist (Oncology) Cordelia Poche, RN as Oncology Nurse Navigator  HEME/ONC OVERVIEW: 1. Stage IV angioimmunoblastic T-cell lymphoma -07/2019: L neck LN FNA non-diagnostic  -08/2019:   Multiple enlarged cervical, mediastinal, bulky retroperitoneal LN's (~5cm), and irregular RLL nodule (1.6x1.1cm) on CT   Excisional left cervical LN bx, path showed angioimmunoblastic T-cell lymphoma, CD30+ (at least 25%)  FDG-avid LN's in the neck, chest and abdomen/pelvis; no bone involvement  Small lymphoid aggregates on the bone marrow biopsy, suspicious for lymphoma involvement   LVEF 79-39%, Grade I diastolic dysfunction  -12/90 - present: Adcetris + CHP w/ Onpro  12/2019: interim PET showed improving disease (Deauville 2); normal LVEF (60-65%)  TREATMENT SUMMARY:  09/30/2019 - present: Adcetris + CHP w/ Onpro  PERTINENT NON-HEM/ONC PROBLEMS: 1. Transient acute systolic heart failure -33/0076: admitted for acute systolic heart failure exacerbation; LVEF 45-50%, troponin I peaked at 862 -10/2019: repeat TTE showed LVEF 60-65%, c/w transient ischemia; LHC showed mild LAD stenosis (30%), no stent required, LVEF 60-65%  -12/2019: LVEF 60-65% on follow-up TTE   ASSESSMENT & PLAN:   Stage IV angioimmunoblastic T-cell lymphoma -S/p 4 cycle of CHP + Adcetris; doxorubicin omitted for Cycle 2 due to new onset systolic heart  failure  -I independently reviewed the radiologic images of recent PET, and agree with the findings documented. -In summary, PET showed improving disease throughout the neck, chest, and abdomen (Deauville 2).  There was no evidence of worsening or new disease.  In addition, echocardiogram showed normal LVEF.  -I reviewed imaging results in detail with the patient -Given the improving disease, we will proceed with 2 more cycles of chemotherapy -Plan for total of 6 cycles of treatment -PRN anti-emetics: Zofran, Compazine, and Ativan   Transient acute systolic heart failure -Repeat echocardiogram in 12/2019 showed normal LVEF (60-65%) -No evidence of volume overload on exam today -Continue chemotherapy as outlined above  Chemotherapy-associated anemia -Secondary to chemotherapy, as well as anemia of chronic disease and hemodilution -Hgb 10.8, stable  -Patient denies any symptom of bleeding -We will monitor for now  Chemotherapy-associated nausea  -Secondary to chemotherapy -Symptoms relatively well controlled  -Continue PRN-anti-emetics   No orders of the defined types were placed in this encounter.  All questions were answered. The patient knows to call the clinic with any problems, questions or concerns. No barriers to learning was detected.  Return in 3 weeks for Cycle 6 (and the final cycle) of chemotherapy and clinic appt.   Tish Men, MD 3/8/202110:36 AM  CHIEF COMPLAINT: "I am doing fine"  INTERVAL HISTORY: [name] [name] returns to clinic for follow-up of Stage IV angioimmunoblastic T-cell lymphoma on CHP + Adcetris.  Patient reports that he had mild intermittent nausea after the last cycle of chemotherapy, lasting about a week, for which he took anti-emetics with improvement.  He denies any vomiting.  He is otherwise tolerating treatment well, and denies any constitutional symptoms, lymphadenopathy, chest pain, dyspnea, abdominal pain, or neuropathy in the hands or  feet.  REVIEW OF SYSTEMS:  Constitutional: ( - ) fevers, ( - )  chills , ( - ) night sweats Eyes: ( - ) blurriness of vision, ( - ) double vision, ( - ) watery eyes Ears, nose, mouth, throat, and face: ( - ) mucositis, ( - ) sore throat Respiratory: ( - ) cough, ( - ) dyspnea, ( - ) wheezes Cardiovascular: ( - ) palpitation, ( - ) chest discomfort, ( - ) lower extremity swelling Gastrointestinal:  ( + ) nausea, ( - ) heartburn, ( - ) change in bowel habits Skin: ( - ) abnormal skin rashes Lymphatics: ( - ) new lymphadenopathy, ( - ) easy bruising Neurological: ( - ) numbness, ( - ) tingling, ( - ) new weaknesses Behavioral/Psych: ( - ) mood change, ( - ) new changes  All other systems were reviewed with the patient and are negative.  SUMMARY OF ONCOLOGIC HISTORY: Oncology History  Angioimmunoblastic lymphoma ([place])  08/25/2019 Imaging   CT neck: IMPRESSION: Right greater than left cervical and supraclavicular adenopathy with some nodes demonstrating potential cystic change or necrosis. This may be infectious/inflammatory or neoplastic and tissue sampling is recommended.   08/25/2019 Imaging   CT chest: IMPRESSION: Multiple areas of adenopathy involving the lower neck, left axilla, subcarinal station, and largest areas of nodal masses throughout the visualized retroperitoneum. Splenomegaly.   Scattered inflammatory stranding adjacent to the distal pancreatic body-tail. 11 cm lobulated cyst in the left kidney.   1.6 and 1.1 cm irregular nodules in the medial basilar segment of the right lower lobe.   09/01/2019 Pathology Results   FINAL MICROSCOPIC DIAGNOSIS:   A. LYMPH NODE, RIGHT SUPRACLAVICULAR, EXCISION:  - Angioimmunoblastic T-cell lymphoma.   COMMENT:   Sections reveal soft tissue with mixed inflammation and foreign body  giant cell reaction with focally entrapped abnormal lymph nodes. The  abnormal lymph nodes are effaced with numerous medium sized atypical   lymphocytes with clear cytoplasm. There are admixed eosinophils, plasma  cells, and smaller lymphocytes. There is increased vascularity. These  atypical cells extend into the surrounding tissue with the mixed soft  tissue reaction. The abnormal lymphocytes are positive for CD2, CD3,  CD4, PD1, CXCL13, bcl-6, bcl-2, and CD10. CD21 and CD23 highlight  disrupted follicular dendritic networks. There is weak CD30 expression  (25%). They are negative for CD7, CD8, CD15, and CD56. CD138 highlights  plasma cells that are polytypic by light chain in situ hybridization.  EBV in situ hybridization is negative in the B-cell and T-cells. CD20  highlights residual cortical B-cells. GMS, AFB, and PAS are negative for  organisms. Flow cytometry ([id number]) reveals abundant T-cells with  decreased/absent CD7. PCR for T-cell receptor gamma gene rearrangement  is positive. Overall, these findings are consistent with involvement by  angioimmunoblastic T-cell lymphoma. A preliminary diagnosis was called  to Dr. Erik Obey on 15/40/0867. Dr. Maylon Peppers was paged on 09/19/19 for the  final diagnosis.    09/08/2019 Initial Diagnosis   T-cell lymphoma ([place])   09/17/2019 Imaging   Pet: IMPRESSION: 1. Active lymphoma within the neck, chest, abdomen, and pelvis, as detailed above. (Deauville) 4 2. Development of small bilateral pleural effusions and abdominal ascites, possibly related to fluid overload. 3. Coronary artery atherosclerosis. Aortic Atherosclerosis (ICD10-I70.0).   09/30/2019 -  Chemotherapy   The patient had DOXOrubicin (ADRIAMYCIN) chemo injection 130 mg, 50 mg/m2 = 130 mg, Intravenous,  Once, 3 of 5 cycles Administration: 130 mg (09/30/2019), 130 mg (12/08/2019), 130 mg (11/17/2019) palonosetron (ALOXI) injection 0.25 mg, 0.25 mg, Intravenous,  Once, 4 of 6 cycles Administration: 0.25 mg (09/30/2019), 0.25 mg (10/27/2019), 0.25 mg (11/17/2019), 0.25 mg (12/08/2019) pegfilgrastim (NEULASTA ONPRO KIT)  injection 6 mg, 6 mg, Subcutaneous, Once, 4 of 6 cycles Administration: 6 mg (09/30/2019), 6 mg (10/27/2019), 6 mg (11/17/2019), 6 mg (12/08/2019) cyclophosphamide (CYTOXAN) 1,940 mg in sodium chloride 0.9 % 250 mL chemo infusion, 750 mg/m2 = 1,940 mg, Intravenous,  Once, 4 of 6 cycles Administration: 1,940 mg (09/30/2019), 1,940 mg (10/27/2019), 1,940 mg (11/17/2019), 1,940 mg (12/08/2019) brentuximab vedotin (ADCETRIS) 135 mg in sodium chloride 0.9 % 100 mL chemo infusion, 135 mg (75 % of original dose 180 mg), Intravenous,  Once, 4 of 6 cycles Dose modification: 180 mg (original dose 180 mg, Cycle 1, Reason: Other (see comments), Comment: Maximum dose Adcetris ), 135 mg (original dose 180 mg, Cycle 1, Reason: Change in SCr/CrCl) Administration: 135 mg (09/30/2019), 180 mg (10/27/2019), 180 mg (11/17/2019), 180 mg (12/08/2019) fosaprepitant (EMEND) 150 mg, dexamethasone (DECADRON) 12 mg in sodium chloride 0.9 % 145 mL IVPB, , Intravenous,  Once, 4 of 6 cycles Administration:  (09/30/2019),  (10/27/2019),  (11/17/2019),  (12/08/2019)  for chemotherapy treatment.      I have reviewed the past medical history, past surgical history, social history and family history with the patient and they are unchanged from previous note.  ALLERGIES:  is allergic to doxycycline.  MEDICATIONS:  Current Outpatient Medications  Medication Sig Dispense Refill  . allopurinol (ZYLOPRIM) 300 MG tablet TAKE 1 TABLET BY MOUTH EVERY DAY 90 tablet 1  . carvedilol (COREG) 12.5 MG tablet Take 1 tablet (12.5 mg total) by mouth 2 (two) times daily with a meal. 180 tablet 2  . hydrochlorothiazide (HYDRODIURIL) 25 MG tablet Take 1 tablet (25 mg total) by mouth daily. 90 tablet 1  . losartan (COZAAR) 100 MG tablet Take 1 tablet (100 mg total) by mouth daily. 90 tablet 1  . metFORMIN (GLUCOPHAGE) 500 MG tablet Take 1 tablet (500 mg total) by mouth daily with breakfast. 90 tablet 1  . Nutritional Supplements (GLUCERNA 1.0 CAL/CARBSTEADY) LIQD  Take 1 Can by mouth daily. Okay to drink up to 3 cans/bottles daily to replace meals as needed. 21330 mL 6  . ondansetron (ZOFRAN) 8 MG tablet Take 1 tablet (8 mg total) by mouth 2 (two) times daily as needed for refractory nausea / vomiting. Start on day 3 after cyclophosphamide chemotherapy. 30 tablet 1  . potassium chloride (KLOR-CON M10) 10 MEQ tablet Take 1 tablet (10 mEq total) by mouth daily. 90 tablet 1  . furosemide (LASIX) 40 MG tablet Take 1 tablet (40 mg total) by mouth daily as needed for fluid or edema. (Patient not taking: Reported on 12/29/2019) 30 tablet 11  . HYDROcodone-acetaminophen (NORCO) 7.5-325 MG tablet Take 1 tablet by mouth 3 (three) times daily as needed for moderate pain. (Patient not taking: Reported on 12/29/2019) 60 tablet 0  . LORazepam (ATIVAN) 0.5 MG tablet Take 1 tablet (0.5 mg total) by mouth every 6 (six) hours as needed (Nausea or vomiting). (Patient not taking: Reported on 12/29/2019) 30 tablet 0  . Omega-3 Fatty Acids (FISH OIL) 1000 MG CAPS Take 1,000 mg by mouth daily.    . predniSONE (DELTASONE) 20 MG tablet Take 100 mg by mouth See admin instructions. Take 100 mg daily for 5 days on days 1 through 5 of chemotherapy    . prochlorperazine (COMPAZINE) 10 MG tablet Take 1 tablet (10 mg total) by mouth every 6 (six) hours as needed (Nausea or vomiting). (  Patient not taking: Reported on 12/08/2019) 30 tablet 6  . promethazine (PHENERGAN) 12.5 MG tablet TAKE 1 TABLET (12.5 MG TOTAL) BY MOUTH EVERY 6 (SIX) HOURS AS NEEDED FOR NAUSEA OR VOMITING. (Patient not taking: Reported on 12/08/2019) 30 tablet 0  . simvastatin (ZOCOR) 80 MG tablet Take 0.5 tablets (40 mg total) by mouth daily. (Patient not taking: Reported on 11/10/2019) 45 tablet 1   No current facility-administered medications for this visit.    PHYSICAL EXAMINATION: ECOG PERFORMANCE STATUS: 2 - Symptomatic, <50% confined to bed  Today's Vitals   12/29/19 1031  BP: 132/63  Pulse: 69  Resp: 18  Temp: (!) 96.9  F (36.1 C)  TempSrc: Temporal  SpO2: 100%  Weight: 268 lb (121.6 kg)  Height: 6' (1.829 m)  PainSc: 0-No pain   Body mass index is 36.35 kg/m.  Filed Weights   12/29/19 1031  Weight: 268 lb (121.6 kg)    GENERAL: alert, no distress and comfortable SKIN: skin color, texture, turgor are normal, no rashes or significant lesions EYES: conjunctiva are pink and non-injected, sclera clear OROPHARYNX: no exudate, no erythema; lips, buccal mucosa, and tongue normal  NECK: supple, non-tender LYMPH:  no palpable lymphadenopathy in the cervical LUNGS: clear to auscultation with normal breathing effort HEART: regular rate & rhythm and no murmurs and trace bilateral lower extremity edema to the shins  ABDOMEN: soft, non-tender, non-distended, normal bowel sounds Musculoskeletal: no cyanosis of digits and no clubbing  PSYCH: alert & oriented x 3, fluent speech  LABORATORY DATA:  I have reviewed the data as listed    Component Value Date/Time   NA 134 (L) 12/08/2019 1130   K 4.1 12/08/2019 1130   CL 100 12/08/2019 1130   CO2 26 12/08/2019 1130   GLUCOSE 102 (H) 12/08/2019 1130   GLUCOSE 101 (H) 08/30/2006 1117   BUN 14 12/08/2019 1130   CREATININE 0.84 12/08/2019 1130   CREATININE 1.41 (H) 06/06/2019 1528   CALCIUM 9.6 12/08/2019 1130   PROT 6.8 12/08/2019 1130   ALBUMIN 3.9 12/08/2019 1130   AST 11 (L) 12/08/2019 1130   ALT 9 12/08/2019 1130   ALKPHOS 79 12/08/2019 1130   BILITOT 0.3 12/08/2019 1130   GFRNONAA >60 12/08/2019 1130   GFRAA >60 12/08/2019 1130    No results found for: SPEP, UPEP  Lab Results  Component Value Date   WBC 7.0 12/29/2019   NEUTROABS 4.9 12/29/2019   HGB 10.8 (L) 12/29/2019   HCT 32.0 (L) 12/29/2019   MCV 92.8 12/29/2019   PLT 382 12/29/2019      Chemistry      Component Value Date/Time   NA 134 (L) 12/08/2019 1130   K 4.1 12/08/2019 1130   CL 100 12/08/2019 1130   CO2 26 12/08/2019 1130   BUN 14 12/08/2019 1130   CREATININE 0.84  12/08/2019 1130   CREATININE 1.41 (H) 06/06/2019 1528      Component Value Date/Time   CALCIUM 9.6 12/08/2019 1130   ALKPHOS 79 12/08/2019 1130   AST 11 (L) 12/08/2019 1130   ALT 9 12/08/2019 1130   BILITOT 0.3 12/08/2019 1130       RADIOGRAPHIC STUDIES: I have personally reviewed the radiological images as listed below and agreed with the findings in the report. NM PET Image Restag (PS) Skull Base To Thigh  Result Date: 12/24/2019 CLINICAL DATA:  Subsequent treatment strategy for lymphoma. EXAM: NUCLEAR MEDICINE PET SKULL BASE TO THIGH TECHNIQUE: 12.5 mCi F-18 FDG was injected intravenously. Full-ring  PET imaging was performed from the skull base to thigh after the radiotracer. CT data was obtained and used for attenuation correction and anatomic localization. Fasting blood glucose: 115 mg/dl COMPARISON:  CT chest 10/15/2019 and PET 09/17/2019. FINDINGS: Mediastinal blood pool activity: SUV max 2.3 Liver activity: SUV max 4.4 NECK: Mild pharyngeal hypermetabolism is likely physiologic. No hypermetabolic lymph nodes. Incidental CT findings: None. CHEST: No hypermetabolic mediastinal, hilar or axillary lymph nodes. No hypermetabolic pulmonary nodules Incidental CT findings: Right IJ Port-A-Cath terminates in the low SVC. Atherosclerotic calcification of the aorta and coronary arteries. Heart is mildly enlarged. No pericardial or pleural effusion. 5 mm subpleural nodule in the right lower lobe (8/34), too small for PET resolution. Lungs are otherwise grossly clear, given degradation by respiratory motion ABDOMEN/PELVIS: No abnormal hypermetabolism in the liver, adrenal glands, spleen or pancreas. Subcentimeter abdominal retroperitoneal lymph nodes measure up to 9 mm in the left periaortic station (4/132) with an SUV max of 2.5, decreased in size from 1.7 cm on 09/17/2019. Similarly, pelvic retroperitoneal lymph nodes have decreased in size as well. Index left external iliac lymph node now measures 7 mm  (4/181) with an SUV max of 2.3, decreased in size from 1.6 cm with an SUV max of 17.5 previously. Inguinal lymph nodes have improved as well. Index left inguinal lymph node measures 6 mm (4/193) with an SUV max of 1.6, compared to 1.7 cm and SUV max of 15.3 previously. Incidental CT findings: Liver, gallbladder, adrenal glands and right kidney are unremarkable. Low-attenuation mass in the left kidney measures 9.5 x 12.0 cm and is likely a cyst. Spleen, pancreas, stomach and bowel are grossly unremarkable. Bladder wall appears thickened but is under distended. Atherosclerotic calcification of the aorta without aneurysm. SKELETON: Focal uptake along the right ischium may be physiologic. Focal hypermetabolism in the medial left iliac wing has an SUV max of 4.9 but no CT correlate. Incidental CT findings: Degenerative changes in the spine. IMPRESSION: 1. Interval response to therapy as evidenced by decrease in size and associated FDG avidity within lymph nodes throughout the neck, chest, abdomen and pelvis (Deauville 2). 2. Focal hypermetabolism within the medial left iliac wing does not have a CT correlate. Continued attention on follow-up exams is suggested. 3. Aortic atherosclerosis (ICD10-I70.0). Coronary artery calcification. Electronically Signed   By: Lorin Picket M.D.   On: 12/24/2019 11:11   ECHOCARDIOGRAM COMPLETE  Result Date: 12/22/2019    ECHOCARDIOGRAM REPORT   Patient Name:   [name] [name] Date of Exam: 12/22/2019 Medical Rec #:  [id number]     Height:       72.0 in Accession #:    [id number]    Weight:       266.0 lb Date of Birth:  05-Aug-1941     BSA:          2.405 m Patient Age:    79 years      BP:           122/74 mmHg Patient Gender: M             HR:           77 bpm. Exam Location:  [facility] Procedure: 2D Echo, Cardiac Doppler, Color Doppler and Strain Analysis Indications:    Chemo,Systolic heart failure, LVEF  History:        Patient has prior history of Echocardiogram examinations, most                  recent  11/07/2019. CHF, Arrythmias:PVC; Risk                 Factors:Hypertension, Dyslipidemia and Diabetes.  Sonographer:    Cardell Peach RDCS (AE) Referring Phys: [id number] [place]  1. Left ventricular ejection fraction, by estimation, is 60 to 65%. The left ventricle has normal function. The left ventricle has no regional wall motion abnormalities. There is moderate concentric left ventricular hypertrophy. Left ventricular diastolic parameters are consistent with Grade I diastolic dysfunction (impaired relaxation).  2. Borderline left ventricular global longitudinal strain is -16.0 %.  3. Right ventricular systolic function is normal. The right ventricular size is mildly enlarged. There is normal pulmonary artery systolic pressure.  4. The mitral valve is normal in structure and function. No evidence of mitral valve regurgitation. No evidence of mitral stenosis.  5. The aortic valve is normal in structure and function. Aortic valve regurgitation is mild. No aortic stenosis is present.  6. Aneurysm of the proximal ascending aorta, measuring 41 mm.  7. No pericardial effusion.  8. Compared to prior study in 10/2019 no significant change. FINDINGS  Left Ventricle: Left ventricular ejection fraction, by estimation, is 60 to 65%. The left ventricle has normal function. The left ventricle has no regional wall motion abnormalities. The average left ventricular global longitudinal strain is -16.0 %. The left ventricular internal cavity size was normal in size. There is moderate concentric left ventricular hypertrophy. Left ventricular diastolic parameters are consistent with Grade I diastolic dysfunction (impaired relaxation). Right Ventricle: The right ventricular size is mildly enlarged. No increase in right ventricular wall thickness. Right ventricular systolic function is normal. There is normal pulmonary artery systolic pressure. The tricuspid regurgitant velocity is 2.50  m/s, and with an  assumed right atrial pressure of 3 mmHg, the estimated right ventricular systolic pressure is 03.5 mmHg. Left Atrium: Left atrial size was normal in size. Right Atrium: Right atrial size was normal in size. Pericardium: There is no evidence of pericardial effusion. Presence of pericardial fat pad. Mitral Valve: The mitral valve is normal in structure and function. Normal mobility of the mitral valve leaflets. No evidence of mitral valve regurgitation. No evidence of mitral valve stenosis. Tricuspid Valve: The tricuspid valve is normal in structure. Tricuspid valve regurgitation is trivial. No evidence of tricuspid stenosis. Aortic Valve: The aortic valve is normal in structure and function. Aortic valve regurgitation is mild. Aortic regurgitation PHT measures 488 msec. No aortic stenosis is present. Pulmonic Valve: The pulmonic valve was normal in structure. Pulmonic valve regurgitation is not visualized. No evidence of pulmonic stenosis. Aorta: The aortic root is normal in size and structure and aortic dilatation noted. There is an aneurysm involving the ascending aorta. The aneurysm measures 41 mm. Venous: The inferior vena cava is normal in size with greater than 50% respiratory variability, suggesting right atrial pressure of 3 mmHg. IAS/Shunts: No atrial level shunt detected by color flow Doppler.  LEFT VENTRICLE PLAX 2D LVIDd:         5.11 cm Diastology LVIDs:         3.18 cm LV e' lateral:   8.38 cm/s LV PW:         1.44 cm LV E/e' lateral: 8.0 LV IVS:        1.31 cm LV e' medial:    6.74 cm/s                        LV E/e' medial:  9.9  2D Longitudinal Strain                        2D Strain GLS Avg:     -16.0 % RIGHT VENTRICLE            IVC RV Basal diam:  4.39 cm    IVC diam: 1.78 cm RV S prime:     7.29 cm/s TAPSE (M-mode): 2.2 cm LEFT ATRIUM             Index       RIGHT ATRIUM           Index LA diam:        4.10 cm 1.70 cm/m  RA Area:     17.00 cm LA Vol (A2C):   45.9 ml 19.08  ml/m RA Volume:   42.05 ml  17.48 ml/m LA Vol (A4C):   52.0 ml 21.62 ml/m LA Biplane Vol: 52.0 ml 21.62 ml/m  AORTIC VALVE LVOT Vmax:   124.00 cm/s LVOT Vmean:  80.100 cm/s LVOT VTI:    0.211 m AI PHT:      488 msec  AORTA Ao Root diam: 3.10 cm Ao Asc diam:  4.10 cm MITRAL VALVE               TRICUSPID VALVE MV Area (PHT): 2.59 cm    TR Peak grad:   25.0 mmHg MV Decel Time: 293 msec    TR Vmax:        250.00 cm/s MV E velocity: 66.70 cm/s MV A velocity: 79.00 cm/s  SHUNTS MV E/A ratio:  0.84        Systemic VTI: 0.21 m Godfrey Pick Tobb DO Electronically signed by Berniece Salines DO Signature Date/Time: 12/22/2019/5:08:06 PM    Final

## 2019-12-29 NOTE — Telephone Encounter (Signed)
Per 3/8 los No change in appts

## 2019-12-29 NOTE — Patient Instructions (Signed)
[facility] Discharge Instructions for Patients Receiving Chemotherapy  Today you received the following chemotherapy agents Adriamycin, Adcetris, Cytoxan  To help prevent nausea and vomiting after your treatment, we encourage you to take your nausea medication    If you develop nausea and vomiting that is not controlled by your nausea medication, call the clinic.   BELOW ARE SYMPTOMS THAT SHOULD BE REPORTED IMMEDIATELY:  *FEVER GREATER THAN 100.5 F  *CHILLS WITH OR WITHOUT FEVER  NAUSEA AND VOMITING THAT IS NOT CONTROLLED WITH YOUR NAUSEA MEDICATION  *UNUSUAL SHORTNESS OF BREATH  *UNUSUAL BRUISING OR BLEEDING  TENDERNESS IN MOUTH AND THROAT WITH OR WITHOUT PRESENCE OF ULCERS  *URINARY PROBLEMS  *BOWEL PROBLEMS  UNUSUAL RASH Items with * indicate a potential emergency and should be followed up as soon as possible.  Feel free to call the clinic should you have any questions or concerns. The clinic phone number is [phone].  Please show the [facility] at check-in to the Emergency Department and triage nurse.

## 2020-01-12 NOTE — Progress Notes (Signed)
Pharmacist Chemotherapy Monitoring - Follow Up Assessment    I verify that I have reviewed each item in the below checklist:  . Regimen for the patient is scheduled for the appropriate day and plan matches scheduled date. Marland Kitchen Appropriate non-routine labs are ordered dependent on drug ordered. . If applicable, additional medications reviewed and ordered per protocol based on lifetime cumulative doses and/or treatment regimen.   Plan for follow-up and/or issues identified: No . I-vent associated with next due treatment: No . MD and/or nursing notified: No  Caitlyne Ingham, Jacqlyn Larsen 01/12/2020 2:29 PM

## 2020-01-19 ENCOUNTER — Inpatient Hospital Stay: Payer: HMO

## 2020-01-19 ENCOUNTER — Telehealth: Payer: Self-pay | Admitting: Hematology

## 2020-01-19 ENCOUNTER — Inpatient Hospital Stay (HOSPITAL_BASED_OUTPATIENT_CLINIC_OR_DEPARTMENT_OTHER): Payer: HMO | Admitting: Hematology

## 2020-01-19 ENCOUNTER — Encounter: Payer: Self-pay | Admitting: Hematology

## 2020-01-19 ENCOUNTER — Other Ambulatory Visit: Payer: Self-pay

## 2020-01-19 VITALS — BP 135/78 | HR 78 | Temp 97.1°F | Resp 17

## 2020-01-19 DIAGNOSIS — T451X5A Adverse effect of antineoplastic and immunosuppressive drugs, initial encounter: Secondary | ICD-10-CM | POA: Diagnosis not present

## 2020-01-19 DIAGNOSIS — R110 Nausea: Secondary | ICD-10-CM | POA: Diagnosis not present

## 2020-01-19 DIAGNOSIS — D6481 Anemia due to antineoplastic chemotherapy: Secondary | ICD-10-CM

## 2020-01-19 DIAGNOSIS — Z5112 Encounter for antineoplastic immunotherapy: Secondary | ICD-10-CM | POA: Diagnosis not present

## 2020-01-19 DIAGNOSIS — C8440 Peripheral T-cell lymphoma, not classified, unspecified site: Secondary | ICD-10-CM

## 2020-01-19 LAB — CBC WITH DIFFERENTIAL (CANCER CENTER ONLY)
Abs Immature Granulocytes: 0.08 10*3/uL — ABNORMAL HIGH (ref 0.00–0.07)
Basophils Absolute: 0.1 10*3/uL (ref 0.0–0.1)
Basophils Relative: 2 %
Eosinophils Absolute: 0.1 10*3/uL (ref 0.0–0.5)
Eosinophils Relative: 2 %
HCT: 33.2 % — ABNORMAL LOW (ref 39.0–52.0)
Hemoglobin: 11.2 g/dL — ABNORMAL LOW (ref 13.0–17.0)
Immature Granulocytes: 1 %
Lymphocytes Relative: 8 %
Lymphs Abs: 0.6 10*3/uL — ABNORMAL LOW (ref 0.7–4.0)
MCH: 32.3 pg (ref 26.0–34.0)
MCHC: 33.7 g/dL (ref 30.0–36.0)
MCV: 95.7 fL (ref 80.0–100.0)
Monocytes Absolute: 1.3 10*3/uL — ABNORMAL HIGH (ref 0.1–1.0)
Monocytes Relative: 17 %
Neutro Abs: 5.5 10*3/uL (ref 1.7–7.7)
Neutrophils Relative %: 70 %
Platelet Count: 320 10*3/uL (ref 150–400)
RBC: 3.47 MIL/uL — ABNORMAL LOW (ref 4.22–5.81)
RDW: 17.9 % — ABNORMAL HIGH (ref 11.5–15.5)
WBC Count: 7.8 10*3/uL (ref 4.0–10.5)
nRBC: 0 % (ref 0.0–0.2)

## 2020-01-19 LAB — CMP (CANCER CENTER ONLY)
ALT: 9 U/L (ref 0–44)
AST: 13 U/L — ABNORMAL LOW (ref 15–41)
Albumin: 4.1 g/dL (ref 3.5–5.0)
Alkaline Phosphatase: 70 U/L (ref 38–126)
Anion gap: 9 (ref 5–15)
BUN: 16 mg/dL (ref 8–23)
CO2: 27 mmol/L (ref 22–32)
Calcium: 9.6 mg/dL (ref 8.9–10.3)
Chloride: 101 mmol/L (ref 98–111)
Creatinine: 0.96 mg/dL (ref 0.61–1.24)
GFR, Est AFR Am: >60 mL/min (ref >60)
GFR, Estimated: >60 mL/min (ref >60)
Glucose, Bld: 109 mg/dL — ABNORMAL HIGH (ref 70–99)
Potassium: 4 mmol/L (ref 3.5–5.1)
Sodium: 137 mmol/L (ref 135–145)
Total Bilirubin: 0.3 mg/dL (ref 0.3–1.2)
Total Protein: 7.1 g/dL (ref 6.5–8.1)

## 2020-01-19 LAB — LACTATE DEHYDROGENASE: LDH: 183 U/L (ref 98–192)

## 2020-01-19 MED ORDER — SODIUM CHLORIDE 0.9 % IV SOLN
180.0000 mg | Freq: Once | INTRAVENOUS | Status: AC
  Administered 2020-01-19: 180 mg via INTRAVENOUS
  Filled 2020-01-19: qty 36

## 2020-01-19 MED ORDER — SODIUM CHLORIDE 0.9% FLUSH
10.0000 mL | INTRAVENOUS | Status: DC | PRN
  Administered 2020-01-19: 10 mL
  Filled 2020-01-19: qty 10

## 2020-01-19 MED ORDER — SODIUM CHLORIDE 0.9 % IV SOLN
750.0000 mg/m2 | Freq: Once | INTRAVENOUS | Status: AC
  Administered 2020-01-19: 1940 mg via INTRAVENOUS
  Filled 2020-01-19: qty 97

## 2020-01-19 MED ORDER — SODIUM CHLORIDE 0.9 % IV SOLN
150.0000 mg | Freq: Once | INTRAVENOUS | Status: AC
  Administered 2020-01-19: 150 mg via INTRAVENOUS
  Filled 2020-01-19: qty 150

## 2020-01-19 MED ORDER — DOXORUBICIN HCL CHEMO IV INJECTION 2 MG/ML
50.0000 mg/m2 | Freq: Once | INTRAVENOUS | Status: AC
  Administered 2020-01-19: 130 mg via INTRAVENOUS
  Filled 2020-01-19: qty 65

## 2020-01-19 MED ORDER — PALONOSETRON HCL INJECTION 0.25 MG/5ML
0.2500 mg | Freq: Once | INTRAVENOUS | Status: AC
  Administered 2020-01-19: 0.25 mg via INTRAVENOUS

## 2020-01-19 MED ORDER — DEXAMETHASONE SODIUM PHOSPHATE 10 MG/ML IJ SOLN
INTRAMUSCULAR | Status: AC
  Filled 2020-01-19: qty 1

## 2020-01-19 MED ORDER — HEPARIN SOD (PORK) LOCK FLUSH 100 UNIT/ML IV SOLN
500.0000 [IU] | Freq: Once | INTRAVENOUS | Status: AC | PRN
  Administered 2020-01-19: 500 [IU]
  Filled 2020-01-19: qty 5

## 2020-01-19 MED ORDER — PEGFILGRASTIM 6 MG/0.6ML ~~LOC~~ PSKT
PREFILLED_SYRINGE | SUBCUTANEOUS | Status: AC
  Filled 2020-01-19: qty 0.6

## 2020-01-19 MED ORDER — PEGFILGRASTIM 6 MG/0.6ML ~~LOC~~ PSKT
6.0000 mg | PREFILLED_SYRINGE | Freq: Once | SUBCUTANEOUS | Status: AC
  Administered 2020-01-19: 6 mg via SUBCUTANEOUS

## 2020-01-19 MED ORDER — PALONOSETRON HCL INJECTION 0.25 MG/5ML
INTRAVENOUS | Status: AC
  Filled 2020-01-19: qty 5

## 2020-01-19 MED ORDER — SODIUM CHLORIDE 0.9 % IV SOLN
Freq: Once | INTRAVENOUS | Status: AC
  Filled 2020-01-19: qty 250

## 2020-01-19 MED ORDER — DEXAMETHASONE SODIUM PHOSPHATE 10 MG/ML IJ SOLN
10.0000 mg | Freq: Once | INTRAMUSCULAR | Status: AC
  Administered 2020-01-19: 10 mg via INTRAVENOUS

## 2020-01-19 NOTE — Progress Notes (Signed)
[place] [facility] OFFICE PROGRESS NOTE  Patient Care Team: Colon Branch, MD as PCP - General [name], [name], MD as PCP - Cardiology (Cardiology) [name], [name], MD as Consulting Physician (Cardiology) [name], MD as Consulting Physician (Gastroenterology) [name], MD as Consulting Physician (Orthopedic Surgery) [name], MD as Consulting Physician (Physical Medicine and Rehabilitation) Luretha Rued, RN as [place] Management [name], MD as Consulting Physician (Otolaryngology) Tish Men, MD as Medical Oncologist (Oncology) Cordelia Poche, RN as Oncology Nurse Navigator  HEME/ONC OVERVIEW: 1. Stage IV angioimmunoblastic T-cell lymphoma -08/2019:   Multiple enlarged cervical, mediastinal, bulky retroperitoneal LN's (~5cm), and irregular RLL nodule (1.6x1.1cm) on CT   Excisional left cervical LN bx, path showed angioimmunoblastic T-cell lymphoma, CD30+ (at least 25%)  FDG-avid LN's in the neck, chest and abdomen/pelvis; no bone involvement  Small lymphoid aggregates on the bone marrow biopsy, suspicious for lymphoma involvement   LVEF 48-54%, Grade I diastolic dysfunction  -62/7035 - present: Adcetris + CHP w/ Onpro  12/2019: interim PET showed improving disease ([place] 2); normal LVEF (60-65%)  TREATMENT SUMMARY:  09/30/2019 - present: Adcetris + CHP w/ Onpro  PERTINENT NON-HEM/ONC PROBLEMS: 1. Transient acute systolic heart failure -00/9381: admitted for acute systolic heart failure exacerbation; LVEF 45-50%, troponin I peaked at 862 -10/2019: repeat TTE showed LVEF 60-65%, c/w transient ischemia; LHC showed mild LAD stenosis (30%), no stent required, LVEF 60-65%  -12/2019: LVEF 60-65% on follow-up TTE   ASSESSMENT & PLAN:   Stage IV angioimmunoblastic T-cell lymphoma -S/p 5 cycle of CHP + Adcetris; doxorubicin omitted for Cycle 2 due to new onset systolic heart failure  -Patient is tolerating treatment well  so far without significant side effects  -Labs reviewed and adequate, proceed with Cycle 6 (and the final cycle) of CHP + Adcetris -We will plan to repeat PET in 4 weeks after the last cycle to assess end-of-treatment response  -PRN anti-emetics: Zofran, Compazine, and Ativan   Transient acute systolic heart failure -Repeat echocardiogram in 12/2019 showed normal LVEF (60-65%) -No evidence of volume overload on exam today -Continue chemotherapy as outlined above -I encouraged the patient to follow up with cardiology for further management   Chemotherapy-associated anemia -Secondary to chemotherapy, as well as anemia of chronic disease and hemodilution -Hgb 11.2, stable  -Patient denies any symptom of bleeding -Continue treatment as outlined above   Chemotherapy-associated nausea  -Secondary to chemotherapy -Symptoms relatively well controlled  -Continue PRN-anti-emetics   Orders Placed This Encounter  Procedures  . NM PET Image Restag (PS) Skull Base To Thigh    Standing Status:   Future    Standing Expiration Date:   01/18/2021    Scheduling Instructions:     Pls schedule prior to 02/26/2020    Order Specific Question:   ** REASON FOR EXAM (FREE TEXT)    Answer:   Angioimmunoblastic T-cell lymphoma, s/p 6 cycles of chemotherapy, assess end-of-treatment response    Order Specific Question:   If indicated for the ordered procedure, I authorize the administration of a radiopharmaceutical per Radiology protocol    Answer:   Yes    Order Specific Question:   Preferred imaging location?    Answer:   [facility]    Order Specific Question:   Radiology Contrast Protocol - do NOT remove file path    Answer:   [email or web address]   All questions were answered. The patient knows to call the clinic with any problems, questions or concerns.  No barriers to learning was detected.  Return in 5 weeks for labs, port flush, imaging results and clinic appt.   Tish Men, MD 3/29/202111:03 AM  CHIEF COMPLAINT: "I am doing fine"  INTERVAL HISTORY: [name] [name] returns to clinic for follow-up of Stage IV angioimmunoblastic T-cell lymphoma on CHP + Adcetris.  Patient reports that he has been tolerating treatment well, except mild intermittent nausea, for which he takes PRN anti-emetics with improvement.  He denies any constitutional symptoms, chest pain, dyspnea, abdominal pain, vomiting, diarrhea, or peripheral neuropathy.  He denies any other complaint today.  REVIEW OF SYSTEMS:   Constitutional: ( - ) fevers, ( - )  chills , ( - ) night sweats Eyes: ( - ) blurriness of vision, ( - ) double vision, ( - ) watery eyes Ears, nose, mouth, throat, and face: ( - ) mucositis, ( - ) sore throat Respiratory: ( - ) cough, ( - ) dyspnea, ( - ) wheezes Cardiovascular: ( - ) palpitation, ( - ) chest discomfort, ( - ) lower extremity swelling Gastrointestinal:  ( + ) nausea, ( - ) heartburn, ( - ) change in bowel habits Skin: ( - ) abnormal skin rashes Lymphatics: ( - ) new lymphadenopathy, ( - ) easy bruising Neurological: ( - ) numbness, ( - ) tingling, ( - ) new weaknesses Behavioral/Psych: ( - ) mood change, ( - ) new changes  All other systems were reviewed with the patient and are negative.  SUMMARY OF ONCOLOGIC HISTORY: Oncology History  Angioimmunoblastic lymphoma ([place])  08/25/2019 Imaging   CT neck: IMPRESSION: Right greater than left cervical and supraclavicular adenopathy with some nodes demonstrating potential cystic change or necrosis. This may be infectious/inflammatory or neoplastic and tissue sampling is recommended.   08/25/2019 Imaging   CT chest: IMPRESSION: Multiple areas of adenopathy involving the lower neck, left axilla, subcarinal station, and largest areas of nodal masses throughout the visualized retroperitoneum. Splenomegaly.   Scattered inflammatory stranding adjacent to the distal pancreatic body-tail. 11 cm lobulated cyst in  the left kidney.   1.6 and 1.1 cm irregular nodules in the medial basilar segment of the right lower lobe.   09/01/2019 Pathology Results   FINAL MICROSCOPIC DIAGNOSIS:   A. LYMPH NODE, RIGHT SUPRACLAVICULAR, EXCISION:  - Angioimmunoblastic T-cell lymphoma.   COMMENT:   Sections reveal soft tissue with mixed inflammation and foreign body  giant cell reaction with focally entrapped abnormal lymph nodes. The  abnormal lymph nodes are effaced with numerous medium sized atypical  lymphocytes with clear cytoplasm. There are admixed eosinophils, plasma  cells, and smaller lymphocytes. There is increased vascularity. These  atypical cells extend into the surrounding tissue with the mixed soft  tissue reaction. The abnormal lymphocytes are positive for CD2, CD3,  CD4, PD1, CXCL13, bcl-6, bcl-2, and CD10. CD21 and CD23 highlight  disrupted follicular dendritic networks. There is weak CD30 expression  (25%). They are negative for CD7, CD8, CD15, and CD56. CD138 highlights  plasma cells that are polytypic by light chain in situ hybridization.  EBV in situ hybridization is negative in the B-cell and T-cells. CD20  highlights residual cortical B-cells. GMS, AFB, and PAS are negative for  organisms. Flow cytometry ([id number]) reveals abundant T-cells with  decreased/absent CD7. PCR for T-cell receptor gamma gene rearrangement  is positive. Overall, these findings are consistent with involvement by  angioimmunoblastic T-cell lymphoma. A preliminary diagnosis was called  to Dr. Erik Obey on 11/02/347. Dr. Maylon Peppers was paged on 09/19/19 for the  final diagnosis.    09/08/2019 Initial Diagnosis   T-cell lymphoma ([place])   09/17/2019 Imaging   Pet: IMPRESSION: 1. Active lymphoma within the neck, chest, abdomen, and pelvis, as detailed above. ([place]) 4 2. Development of small bilateral pleural effusions and abdominal ascites, possibly related to fluid overload. 3. Coronary artery atherosclerosis.  Aortic Atherosclerosis (ICD10-I70.0).   09/30/2019 -  Chemotherapy   The patient had DOXOrubicin (ADRIAMYCIN) chemo injection 130 mg, 50 mg/m2 = 130 mg, Intravenous,  Once, 5 of 5 cycles Administration: 130 mg (09/30/2019), 130 mg (12/08/2019), 130 mg (12/29/2019), 130 mg (11/17/2019) palonosetron (ALOXI) injection 0.25 mg, 0.25 mg, Intravenous,  Once, 6 of 6 cycles Administration: 0.25 mg (09/30/2019), 0.25 mg (10/27/2019), 0.25 mg (11/17/2019), 0.25 mg (12/08/2019), 0.25 mg (12/29/2019) pegfilgrastim (NEULASTA ONPRO KIT) injection 6 mg, 6 mg, Subcutaneous, Once, 6 of 6 cycles Administration: 6 mg (09/30/2019), 6 mg (10/27/2019), 6 mg (11/17/2019), 6 mg (12/08/2019), 6 mg (12/29/2019) cyclophosphamide (CYTOXAN) 1,940 mg in sodium chloride 0.9 % 250 mL chemo infusion, 750 mg/m2 = 1,940 mg, Intravenous,  Once, 6 of 6 cycles Administration: 1,940 mg (09/30/2019), 1,940 mg (10/27/2019), 1,940 mg (11/17/2019), 1,940 mg (12/08/2019), 1,940 mg (12/29/2019) fosaprepitant (EMEND) 150 mg in sodium chloride 0.9 % 145 mL IVPB, 150 mg, Intravenous,  Once, 1 of 1 cycle brentuximab vedotin (ADCETRIS) 135 mg in sodium chloride 0.9 % 100 mL chemo infusion, 135 mg (75 % of original dose 180 mg), Intravenous,  Once, 6 of 6 cycles Dose modification: 180 mg (original dose 180 mg, Cycle 1, Reason: Other (see comments), Comment: Maximum dose Adcetris ), 135 mg (original dose 180 mg, Cycle 1, Reason: Change in SCr/CrCl) Administration: 135 mg (09/30/2019), 180 mg (10/27/2019), 180 mg (11/17/2019), 180 mg (12/08/2019), 180 mg (12/29/2019) fosaprepitant (EMEND) 150 mg, dexamethasone (DECADRON) 12 mg in sodium chloride 0.9 % 145 mL IVPB, , Intravenous,  Once, 5 of 5 cycles Administration:  (09/30/2019),  (10/27/2019),  (11/17/2019),  (12/08/2019),  (12/29/2019)  for chemotherapy treatment.      I have reviewed the past medical history, past surgical history, social history and family history with the patient and they are unchanged from previous  note.  ALLERGIES:  is allergic to doxycycline.  MEDICATIONS:  Current Outpatient Medications  Medication Sig Dispense Refill  . allopurinol (ZYLOPRIM) 300 MG tablet TAKE 1 TABLET BY MOUTH EVERY DAY 90 tablet 1  . carvedilol (COREG) 12.5 MG tablet Take 1 tablet (12.5 mg total) by mouth 2 (two) times daily with a meal. 180 tablet 2  . furosemide (LASIX) 40 MG tablet Take 1 tablet (40 mg total) by mouth daily as needed for fluid or edema. 30 tablet 11  . hydrochlorothiazide (HYDRODIURIL) 25 MG tablet Take 1 tablet (25 mg total) by mouth daily. 90 tablet 1  . HYDROcodone-acetaminophen (NORCO) 7.5-325 MG tablet Take 1 tablet by mouth 3 (three) times daily as needed for moderate pain. 60 tablet 0  . LORazepam (ATIVAN) 0.5 MG tablet Take 1 tablet (0.5 mg total) by mouth every 6 (six) hours as needed (Nausea or vomiting). 30 tablet 0  . losartan (COZAAR) 100 MG tablet Take 1 tablet (100 mg total) by mouth daily. 90 tablet 1  . metFORMIN (GLUCOPHAGE) 500 MG tablet Take 1 tablet (500 mg total) by mouth daily with breakfast. 90 tablet 1  . Nutritional Supplements (GLUCERNA 1.0 CAL/CARBSTEADY) LIQD Take 1 Can by mouth daily. Okay to drink up to 3 cans/bottles daily to replace meals as needed. 21330 mL 6  . Omega-3 Fatty  Acids (FISH OIL) 1000 MG CAPS Take 1,000 mg by mouth daily.    . ondansetron (ZOFRAN) 8 MG tablet Take 1 tablet (8 mg total) by mouth 2 (two) times daily as needed for refractory nausea / vomiting. Start on day 3 after cyclophosphamide chemotherapy. 30 tablet 1  . potassium chloride (KLOR-CON M10) 10 MEQ tablet Take 1 tablet (10 mEq total) by mouth daily. 90 tablet 1  . predniSONE (DELTASONE) 20 MG tablet Take 100 mg by mouth See admin instructions. Take 100 mg daily for 5 days on days 1 through 5 of chemotherapy    . prochlorperazine (COMPAZINE) 10 MG tablet Take 1 tablet (10 mg total) by mouth every 6 (six) hours as needed (Nausea or vomiting). 30 tablet 6  . promethazine (PHENERGAN) 12.5  MG tablet TAKE 1 TABLET (12.5 MG TOTAL) BY MOUTH EVERY 6 (SIX) HOURS AS NEEDED FOR NAUSEA OR VOMITING. 30 tablet 0  . simvastatin (ZOCOR) 80 MG tablet Take 0.5 tablets (40 mg total) by mouth daily. 45 tablet 1   No current facility-administered medications for this visit.   Facility-Administered Medications Ordered in Other Visits  Medication Dose Route Frequency Provider Last Rate Last Admin  . 0.9 %  sodium chloride infusion   Intravenous Once Tish Men, MD      . brentuximab vedotin (ADCETRIS) 180 mg in sodium chloride 0.9 % 100 mL chemo infusion  180 mg Intravenous Once Tish Men, MD      . cyclophosphamide (CYTOXAN) 1,940 mg in sodium chloride 0.9 % 250 mL chemo infusion  750 mg/m2 (Treatment Plan Recorded) Intravenous Once Tish Men, MD      . dexamethasone (DECADRON) injection 10 mg  10 mg Intravenous Once [name], MD      . DOXOrubicin (ADRIAMYCIN) chemo injection 130 mg  50 mg/m2 (Treatment Plan Recorded) Intravenous Once Tish Men, MD      . fosaprepitant (EMEND) 150 mg in sodium chloride 0.9 % 145 mL IVPB  150 mg Intravenous Once Tish Men, MD      . heparin lock flush 100 unit/mL  500 Units Intracatheter Once PRN Tish Men, MD      . palonosetron (ALOXI) injection 0.25 mg  0.25 mg Intravenous Once Tish Men, MD      . pegfilgrastim (NEULASTA ONPRO KIT) injection 6 mg  6 mg Subcutaneous Once Tish Men, MD      . sodium chloride flush (NS) 0.9 % injection 10 mL  10 mL Intracatheter PRN Tish Men, MD        PHYSICAL EXAMINATION: ECOG PERFORMANCE STATUS: 2 - Symptomatic, <50% confined to bed  Today's Vitals   01/19/20 1044  BP: 135/78  Pulse: 78  Resp: 17  Temp: (!) 97.1 F (36.2 C)  TempSrc: Temporal  SpO2: 100%  PainSc: 0-No pain   There is no height or weight on file to calculate BMI.  There were no vitals filed for this visit.  GENERAL: alert, no distress and comfortable SKIN: skin color, texture, turgor are normal, no rashes or significant lesions EYES:  conjunctiva are pink and non-injected, sclera clear OROPHARYNX: no exudate, no erythema; lips, buccal mucosa, and tongue normal  NECK: supple, non-tender LYMPH:  no palpable lymphadenopathy in the cervical LUNGS: clear to auscultation with normal breathing effort HEART: regular rate & rhythm and no murmurs and no lower extremity edema ABDOMEN: soft, non-tender, non-distended, normal bowel sounds Musculoskeletal: no cyanosis of digits and no clubbing  PSYCH: alert & oriented x 3, fluent speech  LABORATORY DATA:  I have reviewed the data as listed    Component Value Date/Time   NA 134 (L) 12/29/2019 1021   K 3.8 12/29/2019 1021   CL 99 12/29/2019 1021   CO2 26 12/29/2019 1021   GLUCOSE 105 (H) 12/29/2019 1021   GLUCOSE 101 (H) 08/30/2006 1117   BUN 10 12/29/2019 1021   CREATININE 0.94 12/29/2019 1021   CREATININE 1.41 (H) 06/06/2019 1528   CALCIUM 9.3 12/29/2019 1021   PROT 7.1 12/29/2019 1021   ALBUMIN 4.2 12/29/2019 1021   AST 11 (L) 12/29/2019 1021   ALT 8 12/29/2019 1021   ALKPHOS 80 12/29/2019 1021   BILITOT 0.4 12/29/2019 1021   GFRNONAA >60 12/29/2019 1021   GFRAA >60 12/29/2019 1021    No results found for: SPEP, UPEP  Lab Results  Component Value Date   WBC 7.8 01/19/2020   NEUTROABS 5.5 01/19/2020   HGB 11.2 (L) 01/19/2020   HCT 33.2 (L) 01/19/2020   MCV 95.7 01/19/2020   PLT 320 01/19/2020      Chemistry      Component Value Date/Time   NA 134 (L) 12/29/2019 1021   K 3.8 12/29/2019 1021   CL 99 12/29/2019 1021   CO2 26 12/29/2019 1021   BUN 10 12/29/2019 1021   CREATININE 0.94 12/29/2019 1021   CREATININE 1.41 (H) 06/06/2019 1528      Component Value Date/Time   CALCIUM 9.3 12/29/2019 1021   ALKPHOS 80 12/29/2019 1021   AST 11 (L) 12/29/2019 1021   ALT 8 12/29/2019 1021   BILITOT 0.4 12/29/2019 1021       RADIOGRAPHIC STUDIES: I have personally reviewed the radiological images as listed below and agreed with the findings in the report. NM  PET Image Restag (PS) Skull Base To Thigh  Result Date: 12/24/2019 CLINICAL DATA:  Subsequent treatment strategy for lymphoma. EXAM: NUCLEAR MEDICINE PET SKULL BASE TO THIGH TECHNIQUE: 12.5 mCi F-18 FDG was injected intravenously. Full-ring PET imaging was performed from the skull base to thigh after the radiotracer. CT data was obtained and used for attenuation correction and anatomic localization. Fasting blood glucose: 115 mg/dl COMPARISON:  CT chest 10/15/2019 and PET 09/17/2019. FINDINGS: Mediastinal blood pool activity: SUV max 2.3 Liver activity: SUV max 4.4 NECK: Mild pharyngeal hypermetabolism is likely physiologic. No hypermetabolic lymph nodes. Incidental CT findings: None. CHEST: No hypermetabolic mediastinal, hilar or axillary lymph nodes. No hypermetabolic pulmonary nodules Incidental CT findings: Right IJ Port-A-Cath terminates in the low SVC. Atherosclerotic calcification of the aorta and coronary arteries. Heart is mildly enlarged. No pericardial or pleural effusion. 5 mm subpleural nodule in the right lower lobe (8/34), too small for PET resolution. Lungs are otherwise grossly clear, given degradation by respiratory motion ABDOMEN/PELVIS: No abnormal hypermetabolism in the liver, adrenal glands, spleen or pancreas. Subcentimeter abdominal retroperitoneal lymph nodes measure up to 9 mm in the left periaortic station (4/132) with an SUV max of 2.5, decreased in size from 1.7 cm on 09/17/2019. Similarly, pelvic retroperitoneal lymph nodes have decreased in size as well. Index left external iliac lymph node now measures 7 mm (4/181) with an SUV max of 2.3, decreased in size from 1.6 cm with an SUV max of 17.5 previously. Inguinal lymph nodes have improved as well. Index left inguinal lymph node measures 6 mm (4/193) with an SUV max of 1.6, compared to 1.7 cm and SUV max of 15.3 previously. Incidental CT findings: Liver, gallbladder, adrenal glands and right kidney are unremarkable. Low-attenuation  mass in the left kidney measures 9.5 x 12.0 cm and is likely a cyst. Spleen, pancreas, stomach and bowel are grossly unremarkable. Bladder wall appears thickened but is under distended. Atherosclerotic calcification of the aorta without aneurysm. SKELETON: Focal uptake along the right ischium may be physiologic. Focal hypermetabolism in the medial left iliac wing has an SUV max of 4.9 but no CT correlate. Incidental CT findings: Degenerative changes in the spine. IMPRESSION: 1. Interval response to therapy as evidenced by decrease in size and associated FDG avidity within lymph nodes throughout the neck, chest, abdomen and pelvis ([place] 2). 2. Focal hypermetabolism within the medial left iliac wing does not have a CT correlate. Continued attention on follow-up exams is suggested. 3. Aortic atherosclerosis (ICD10-I70.0). Coronary artery calcification. Electronically Signed   By: Lorin Picket M.D.   On: 12/24/2019 11:11   ECHOCARDIOGRAM COMPLETE  Result Date: 12/22/2019    ECHOCARDIOGRAM REPORT   Patient Name:   [name] [name] Date of Exam: 12/22/2019 Medical Rec #:  [id number]     Height:       72.0 in Accession #:    [id number]    Weight:       266.0 lb Date of Birth:  May 09, 1941     BSA:          2.405 m Patient Age:    79 years      BP:           122/74 mmHg Patient Gender: M             HR:           77 bpm. Exam Location:  [place] Procedure: 2D Echo, Cardiac Doppler, Color Doppler and Strain Analysis Indications:    Chemo,Systolic heart failure, LVEF  History:        Patient has prior history of Echocardiogram examinations, most                 recent 11/07/2019. CHF, Arrythmias:PVC; Risk                 Factors:Hypertension, Dyslipidemia and Diabetes.  Sonographer:    Cardell Peach RDCS (AE) Referring Phys: [id number] [place]  1. Left ventricular ejection fraction, by estimation, is 60 to 65%. The left ventricle has normal function. The left ventricle has no regional wall motion  abnormalities. There is moderate concentric left ventricular hypertrophy. Left ventricular diastolic parameters are consistent with Grade I diastolic dysfunction (impaired relaxation).  2. Borderline left ventricular global longitudinal strain is -16.0 %.  3. Right ventricular systolic function is normal. The right ventricular size is mildly enlarged. There is normal pulmonary artery systolic pressure.  4. The mitral valve is normal in structure and function. No evidence of mitral valve regurgitation. No evidence of mitral stenosis.  5. The aortic valve is normal in structure and function. Aortic valve regurgitation is mild. No aortic stenosis is present.  6. Aneurysm of the proximal ascending aorta, measuring 41 mm.  7. No pericardial effusion.  8. Compared to prior study in 10/2019 no significant change. FINDINGS  Left Ventricle: Left ventricular ejection fraction, by estimation, is 60 to 65%. The left ventricle has normal function. The left ventricle has no regional wall motion abnormalities. The average left ventricular global longitudinal strain is -16.0 %. The left ventricular internal cavity size was normal in size. There is moderate concentric left ventricular hypertrophy. Left ventricular diastolic parameters are consistent with Grade I diastolic dysfunction (impaired relaxation). Right Ventricle: The  right ventricular size is mildly enlarged. No increase in right ventricular wall thickness. Right ventricular systolic function is normal. There is normal pulmonary artery systolic pressure. The tricuspid regurgitant velocity is 2.50  m/s, and with an assumed right atrial pressure of 3 mmHg, the estimated right ventricular systolic pressure is 24.5 mmHg. Left Atrium: Left atrial size was normal in size. Right Atrium: Right atrial size was normal in size. Pericardium: There is no evidence of pericardial effusion. Presence of pericardial fat pad. Mitral Valve: The mitral valve is normal in structure and function.  Normal mobility of the mitral valve leaflets. No evidence of mitral valve regurgitation. No evidence of mitral valve stenosis. Tricuspid Valve: The tricuspid valve is normal in structure. Tricuspid valve regurgitation is trivial. No evidence of tricuspid stenosis. Aortic Valve: The aortic valve is normal in structure and function. Aortic valve regurgitation is mild. Aortic regurgitation PHT measures 488 msec. No aortic stenosis is present. Pulmonic Valve: The pulmonic valve was normal in structure. Pulmonic valve regurgitation is not visualized. No evidence of pulmonic stenosis. Aorta: The aortic root is normal in size and structure and aortic dilatation noted. There is an aneurysm involving the ascending aorta. The aneurysm measures 41 mm. Venous: The inferior vena cava is normal in size with greater than 50% respiratory variability, suggesting right atrial pressure of 3 mmHg. IAS/Shunts: No atrial level shunt detected by color flow Doppler.  LEFT VENTRICLE PLAX 2D LVIDd:         5.11 cm Diastology LVIDs:         3.18 cm LV e' lateral:   8.38 cm/s LV PW:         1.44 cm LV E/e' lateral: 8.0 LV IVS:        1.31 cm LV e' medial:    6.74 cm/s                        LV E/e' medial:  9.9                         2D Longitudinal Strain                        2D Strain GLS Avg:     -16.0 % RIGHT VENTRICLE            IVC RV Basal diam:  4.39 cm    IVC diam: 1.78 cm RV S prime:     7.29 cm/s TAPSE (M-mode): 2.2 cm LEFT ATRIUM             Index       RIGHT ATRIUM           Index LA diam:        4.10 cm 1.70 cm/m  RA Area:     17.00 cm LA Vol (A2C):   45.9 ml 19.08 ml/m RA Volume:   42.05 ml  17.48 ml/m LA Vol (A4C):   52.0 ml 21.62 ml/m LA Biplane Vol: 52.0 ml 21.62 ml/m  AORTIC VALVE LVOT Vmax:   124.00 cm/s LVOT Vmean:  80.100 cm/s LVOT VTI:    0.211 m AI PHT:      488 msec  AORTA Ao Root diam: 3.10 cm Ao Asc diam:  4.10 cm MITRAL VALVE               TRICUSPID VALVE MV Area (PHT): 2.59 cm    TR Peak grad:  25.0 mmHg  MV Decel Time: 293 msec    TR Vmax:        250.00 cm/s MV E velocity: 66.70 cm/s MV A velocity: 79.00 cm/s  SHUNTS MV E/A ratio:  0.84        Systemic VTI: 0.21 m Godfrey Pick Tobb DO Electronically signed by Berniece Salines DO Signature Date/Time: 12/22/2019/5:08:06 PM    Final

## 2020-01-19 NOTE — Patient Instructions (Signed)

## 2020-01-19 NOTE — Telephone Encounter (Signed)
Appointments scheduled calendar printed per 3/29 los 

## 2020-01-20 ENCOUNTER — Telehealth: Payer: Self-pay | Admitting: Hematology

## 2020-01-20 ENCOUNTER — Encounter: Payer: Self-pay | Admitting: *Deleted

## 2020-01-20 NOTE — Telephone Encounter (Signed)
I called patient to advise him of PET Scan appointment date/time/location and instructions/ Water only 6 hrs prior, no gum, no candy/ also NO CARBS the day before scan.  He voiced understanding of these instructions.

## 2020-01-27 ENCOUNTER — Other Ambulatory Visit: Payer: Self-pay | Admitting: *Deleted

## 2020-01-27 DIAGNOSIS — I5020 Unspecified systolic (congestive) heart failure: Secondary | ICD-10-CM

## 2020-01-27 DIAGNOSIS — C8440 Peripheral T-cell lymphoma, not classified, unspecified site: Secondary | ICD-10-CM

## 2020-01-27 DIAGNOSIS — I5021 Acute systolic (congestive) heart failure: Secondary | ICD-10-CM

## 2020-01-28 ENCOUNTER — Ambulatory Visit: Payer: HMO | Admitting: Cardiovascular Disease

## 2020-02-02 ENCOUNTER — Telehealth: Payer: Self-pay | Admitting: *Deleted

## 2020-02-02 NOTE — Telephone Encounter (Signed)
Tried calling patient back but unable to leave a message regarding referral to Dr. Glori Bickers. I will try calling him later today.

## 2020-02-05 NOTE — Telephone Encounter (Signed)
I called patient back regarding appointment with Dr. Glori Bickers. Appointment is scheduled for Monday, June 7th. I told him to keep this appointment due to his CHF. He verbalized understanding.

## 2020-02-23 ENCOUNTER — Ambulatory Visit (HOSPITAL_COMMUNITY)
Admission: RE | Admit: 2020-02-23 | Discharge: 2020-02-23 | Disposition: A | Discharge status: Home / Self Care | Payer: HMO | Source: Ambulatory Visit | Attending: Hematology | Admitting: Hematology

## 2020-02-23 ENCOUNTER — Other Ambulatory Visit: Payer: Self-pay

## 2020-02-23 DIAGNOSIS — C8448 Peripheral T-cell lymphoma, not classified, lymph nodes of multiple sites: Secondary | ICD-10-CM | POA: Diagnosis not present

## 2020-02-23 DIAGNOSIS — Z79899 Other long term (current) drug therapy: Secondary | ICD-10-CM | POA: Insufficient documentation

## 2020-02-23 DIAGNOSIS — R918 Other nonspecific abnormal finding of lung field: Secondary | ICD-10-CM | POA: Insufficient documentation

## 2020-02-23 DIAGNOSIS — C8440 Peripheral T-cell lymphoma, not classified, unspecified site: Secondary | ICD-10-CM | POA: Insufficient documentation

## 2020-02-23 LAB — GLUCOSE, CAPILLARY: Glucose-Capillary: 125 mg/dL — ABNORMAL HIGH (ref 70–99)

## 2020-02-23 MED ORDER — FLUDEOXYGLUCOSE F - 18 (FDG) INJECTION
13.1300 | Freq: Once | INTRAVENOUS | Status: AC | PRN
  Administered 2020-02-23: 13.13 via INTRAVENOUS

## 2020-02-26 ENCOUNTER — Telehealth: Payer: Self-pay | Admitting: Hematology

## 2020-02-26 ENCOUNTER — Inpatient Hospital Stay: Payer: HMO

## 2020-02-26 ENCOUNTER — Inpatient Hospital Stay (HOSPITAL_BASED_OUTPATIENT_CLINIC_OR_DEPARTMENT_OTHER): Payer: HMO | Admitting: Hematology

## 2020-02-26 ENCOUNTER — Inpatient Hospital Stay: Payer: HMO | Attending: Hematology

## 2020-02-26 ENCOUNTER — Encounter: Payer: Self-pay | Admitting: Hematology

## 2020-02-26 ENCOUNTER — Other Ambulatory Visit: Payer: Self-pay

## 2020-02-26 ENCOUNTER — Encounter: Payer: Self-pay | Admitting: *Deleted

## 2020-02-26 VITALS — BP 119/69 | HR 66 | Temp 97.1°F | Resp 18 | Ht 72.0 in | Wt 265.4 lb

## 2020-02-26 DIAGNOSIS — T451X5A Adverse effect of antineoplastic and immunosuppressive drugs, initial encounter: Secondary | ICD-10-CM | POA: Diagnosis not present

## 2020-02-26 DIAGNOSIS — C8590 Non-Hodgkin lymphoma, unspecified, unspecified site: Secondary | ICD-10-CM

## 2020-02-26 DIAGNOSIS — Z95828 Presence of other vascular implants and grafts: Secondary | ICD-10-CM

## 2020-02-26 DIAGNOSIS — D701 Agranulocytosis secondary to cancer chemotherapy: Secondary | ICD-10-CM

## 2020-02-26 DIAGNOSIS — D638 Anemia in other chronic diseases classified elsewhere: Secondary | ICD-10-CM | POA: Insufficient documentation

## 2020-02-26 DIAGNOSIS — I10 Essential (primary) hypertension: Secondary | ICD-10-CM | POA: Diagnosis not present

## 2020-02-26 DIAGNOSIS — R9389 Abnormal findings on diagnostic imaging of other specified body structures: Secondary | ICD-10-CM | POA: Insufficient documentation

## 2020-02-26 DIAGNOSIS — I428 Other cardiomyopathies: Secondary | ICD-10-CM | POA: Insufficient documentation

## 2020-02-26 DIAGNOSIS — R911 Solitary pulmonary nodule: Secondary | ICD-10-CM | POA: Diagnosis not present

## 2020-02-26 DIAGNOSIS — Z7984 Long term (current) use of oral hypoglycemic drugs: Secondary | ICD-10-CM | POA: Diagnosis not present

## 2020-02-26 DIAGNOSIS — C8440 Peripheral T-cell lymphoma, not classified, unspecified site: Secondary | ICD-10-CM | POA: Diagnosis not present

## 2020-02-26 DIAGNOSIS — D6481 Anemia due to antineoplastic chemotherapy: Secondary | ICD-10-CM

## 2020-02-26 DIAGNOSIS — E871 Hypo-osmolality and hyponatremia: Secondary | ICD-10-CM

## 2020-02-26 DIAGNOSIS — E669 Obesity, unspecified: Secondary | ICD-10-CM | POA: Insufficient documentation

## 2020-02-26 DIAGNOSIS — E119 Type 2 diabetes mellitus without complications: Secondary | ICD-10-CM | POA: Insufficient documentation

## 2020-02-26 DIAGNOSIS — M2651 Abnormal jaw closure: Secondary | ICD-10-CM

## 2020-02-26 LAB — CMP (CANCER CENTER ONLY)
ALT: 9 U/L (ref 0–44)
AST: 12 U/L — ABNORMAL LOW (ref 15–41)
Albumin: 4.4 g/dL (ref 3.5–5.0)
Alkaline Phosphatase: 80 U/L (ref 38–126)
Anion gap: 9 (ref 5–15)
BUN: 18 mg/dL (ref 8–23)
CO2: 27 mmol/L (ref 22–32)
Calcium: 9.9 mg/dL (ref 8.9–10.3)
Chloride: 100 mmol/L (ref 98–111)
Creatinine: 1.05 mg/dL (ref 0.61–1.24)
GFR, Est AFR Am: >60 mL/min (ref >60)
GFR, Estimated: >60 mL/min (ref >60)
Glucose, Bld: 112 mg/dL — ABNORMAL HIGH (ref 70–99)
Potassium: 4 mmol/L (ref 3.5–5.1)
Sodium: 136 mmol/L (ref 135–145)
Total Bilirubin: 0.6 mg/dL (ref 0.3–1.2)
Total Protein: 7.1 g/dL (ref 6.5–8.1)

## 2020-02-26 LAB — CBC WITH DIFFERENTIAL (CANCER CENTER ONLY)
Abs Immature Granulocytes: 0.05 10*3/uL (ref 0.00–0.07)
Basophils Absolute: 0.1 10*3/uL (ref 0.0–0.1)
Basophils Relative: 1 %
Eosinophils Absolute: 0.3 10*3/uL (ref 0.0–0.5)
Eosinophils Relative: 4 %
HCT: 35.5 % — ABNORMAL LOW (ref 39.0–52.0)
Hemoglobin: 12 g/dL — ABNORMAL LOW (ref 13.0–17.0)
Immature Granulocytes: 1 %
Lymphocytes Relative: 10 %
Lymphs Abs: 0.8 10*3/uL (ref 0.7–4.0)
MCH: 32.7 pg (ref 26.0–34.0)
MCHC: 33.8 g/dL (ref 30.0–36.0)
MCV: 96.7 fL (ref 80.0–100.0)
Monocytes Absolute: 1.1 10*3/uL — ABNORMAL HIGH (ref 0.1–1.0)
Monocytes Relative: 13 %
Neutro Abs: 5.8 10*3/uL (ref 1.7–7.7)
Neutrophils Relative %: 71 %
Platelet Count: 327 10*3/uL (ref 150–400)
RBC: 3.67 MIL/uL — ABNORMAL LOW (ref 4.22–5.81)
RDW: 13.7 % (ref 11.5–15.5)
WBC Count: 8.2 10*3/uL (ref 4.0–10.5)
nRBC: 0 % (ref 0.0–0.2)

## 2020-02-26 LAB — LACTATE DEHYDROGENASE: LDH: 161 U/L (ref 98–192)

## 2020-02-26 MED ORDER — SODIUM CHLORIDE 0.9% FLUSH
10.0000 mL | Freq: Once | INTRAVENOUS | Status: AC
  Administered 2020-02-26: 10 mL via INTRAVENOUS
  Filled 2020-02-26: qty 10

## 2020-02-26 MED ORDER — HEPARIN SOD (PORK) LOCK FLUSH 100 UNIT/ML IV SOLN
500.0000 [IU] | Freq: Once | INTRAVENOUS | Status: AC
  Administered 2020-02-26: 500 [IU] via INTRAVENOUS
  Filled 2020-02-26: qty 5

## 2020-02-26 NOTE — Patient Instructions (Signed)
Tunneled Central Venous Catheter Flushing Guide  It is important to flush your tunneled central venous catheter each time you use it, both before and after you use it. Flushing your catheter will help prevent it from clogging. What are the risks? Risks may include:  Infection.  Air getting into the catheter and bloodstream. Supplies needed:  A clean pair of gloves.  A disinfecting wipe. Use an alcohol wipe, chlorhexidine wipe, or iodine wipe as told by your health care provider.  A 10 mL syringe that has been prefilled with saline solution.  An empty 10 mL syringe, if a substance called heparin was injected into your catheter. How to flush your catheter When you flush your catheter, make sure you follow any specific instructions from your health care provider or the manufacturer. These are general guidelines. Flushing your catheter before use If there is heparin in your catheter: 1. Wash your hands with soap and water. 2. Put on gloves. 3. Scrub the injection cap for a minimum of 15 seconds with a disinfecting wipe. 4. Unclamp the catheter. 5. Attach the empty syringe to the injection cap. 6. Pull the syringe plunger back and withdraw 10 mL of blood. 7. Place the syringe into an appropriate waste container. 8. Scrub the injection cap for 15 seconds with a disinfecting wipe. 9. Attach the prefilled syringe to the injection cap. 10. Flush the catheter by pushing the plunger forward until all the liquid from the syringe is in the catheter. 11. Remove the syringe from the injection cap. 12. Clamp the catheter. If there is no heparin in your catheter: 1. Wash your hands with soap and water. 2. Put on gloves. 3. Scrub the injection cap for 15 seconds with a disinfecting wipe. 4. Unclamp the catheter. 5. Attach the prefilled syringe to the injection cap. 6. Flush the catheter by pushing the plunger forward until 5 mL of the liquid from the syringe is in the catheter. 7. Pull back on  the syringe until you see blood in the catheter. 8. If you have been asked to collect any blood, follow your health care provider's instructions. Otherwise, flush the catheter with the rest of the solution from the syringe. 9. Remove the syringe from the injection cap. 10. Clamp the catheter.  Flushing your catheter after use 1. Wash your hands with soap and water. 2. Put on gloves. 3. Scrub the injection cap for 15 seconds with a disinfecting wipe. 4. Unclamp the catheter. 5. Attach the prefilled syringe to the injection cap. 6. Flush the catheter by pushing the plunger forward until all of the liquid from the syringe is in the catheter. 7. Remove the syringe from the injection cap. 8. Clamp the catheter. Problems and solutions  If blood cannot be completely cleared from the injection cap, you may need to have the injection cap replaced.  If the catheter is difficult to flush, use the pulsing method. The pulsing method involves pushing only a few milliliters of solution into the catheter at a time and pausing between pushes.  If you do not see blood in the catheter when you pull back on the syringe, change your body position, such as by raising your arms above your head. Take a deep breath and cough. Then, pull back on the syringe. If you still do not see blood, flush the catheter with a small amount of solution. Then, change positions again and take a breath or cough. Pull back on the syringe again. If you still do not see   blood, finish flushing the catheter and contact your health care provider. Do not use your catheter until your health care provider says it is okay. General tips  Have someone help you flush your catheter, if possible.  Do not force fluid through your catheter.  Do not use a syringe that is larger or smaller than 10 mL. Using a smaller syringe can make the catheter burst.  Do not use your catheter without flushing it first if it has heparin in it. Contact a health  care provider if:  You cannot see any blood in the catheter when you flush it before using it.  Your catheter is difficult to flush. Get help right away if:  You cannot flush the catheter.  The catheter leaks when you flush it or when there is fluid in it.  There are cracks or breaks in the catheter. Summary  It is important to flush your tunneled central venous catheter each time you use it, both before and after you use it.  Scrub the injection cap for 15 seconds with a disinfecting wipe before and after you flush it.  When you flush your catheter, make sure you follow any specific instructions from your health care provider or the manufacturer.  Get help right away if you cannot flush the catheter. This information is not intended to replace advice given to you by your health care provider. Make sure you discuss any questions you have with your health care provider. Document Revised: 07/04/2019 Document Reviewed: 12/25/2018 Elsevier Patient Education  2020 [employer]  

## 2020-02-26 NOTE — Telephone Encounter (Signed)
Appointments scheduled calendar was printed per 5/6 los 

## 2020-02-26 NOTE — Progress Notes (Signed)
[facility] OFFICE PROGRESS NOTE  Patient Care Team: Colon Branch, MD as PCP - General [name], [name], MD as PCP - Cardiology (Cardiology) [name], [name], MD as Consulting Physician (Cardiology) [name], MD as Consulting Physician (Gastroenterology) [name], MD as Consulting Physician (Orthopedic Surgery) [name], MD as Consulting Physician (Physical Medicine and Rehabilitation) Luretha Rued, RN as [facility] [name], MD as Consulting Physician (Otolaryngology) Tish Men, MD as Medical Oncologist (Oncology) Cordelia Poche, RN as Oncology Nurse Navigator  HEME/ONC OVERVIEW: 1. Stage IV angioimmunoblastic T-cell lymphoma -08/2019:   Multiple enlarged cervical, mediastinal, bulky retroperitoneal LN's (~5cm), and irregular RLL nodule (1.6x1.1cm) on CT   Excisional left cervical LN bx, path showed angioimmunoblastic T-cell lymphoma, CD30+ (at least 25%)  FDG-avid LN's in the neck, chest and abdomen/pelvis; no bone involvement  Small lymphoid aggregates on the bone marrow biopsy, suspicious for lymphoma involvement   LVEF 31-54%, Grade I diastolic dysfunction  -00/8676 - late 12/2019: Adcetris + CHP w/ Onpro x 6 cycles  12/2019: interim PET showed improving disease ([place]ville 2); normal LVEF (60-65%)  02/2020: end-of-treatment PET NED ([place] 1).  Single focus of FDG uptake in the RLL of lung along the spine, ?atelectasis  -On surveillance  TREATMENT SUMMARY:  09/30/2019 - 01/19/2020: Adcetris + CHP w/ Onpro  On surveillance; patient declined evaluation for auto-SCT   PERTINENT NON-HEM/ONC PROBLEMS: 1. Transient acute systolic heart failure -19/5093: admitted for acute systolic heart failure exacerbation; LVEF 45-50%, troponin I peaked at 862 -10/2019: repeat TTE showed LVEF 60-65%, c/w transient ischemia; LHC showed mild LAD stenosis (30%), no stent required, LVEF 60-65%  -12/2019: LVEF 60-65% on  follow-up TTE   ASSESSMENT & PLAN:   Stage IV angioimmunoblastic T-cell lymphoma -S/p 6 cycle of CHP + Adcetris; doxorubicin omitted for Cycle 2 due to transient systolic heart failure  -I independently reviewed the radiologic images of recent PET, and agree with the findings as documented.  In summary, PET showed no evidence of residual disease (Deauville 1), consistent with clinical remission.  There was a single focus of FDG uptake in the right lower lobe of the lung along the spine, possibly representing atelectasis. -I discussed the imaging results in detail with the patient, as well as the NCCN guideline -For patients with T-cell lymphoma in CR, there is potential benefit for consolidation with autologous stem cell transplant.  However, given the patient's multiple comorbidities, including HTN, Type II DM, transient cardiomyopathy, and obesity, his candidacy for transplant is very borderline. -After a very lengthy discussion, the patient declined referral to [facility] for further discussion of the benefits and risks of autologous stem cell transplant. -We will plan to obtain surveillance scans q61month x 2 years (next in late 07/2020) -I counseled the patient on some of the concerning symptoms, including but not limited to, unexplained persistent fever, drenching night sweats, unexplained weight loss, or progressive lymphadenopathy, for which he is instructed to seek further evaluation -As he has completed the treatment, I have ordered port removal  Abnormal dental x-rays -Patient recently went for a routine dental extraction, and panoramic dental x-ray showed 2 areas of increased density near the angle of the jaw -Patient reports history of jaw fracture requiring surgery -His dentist instructed the patient to see oncology for further evaluation -PET scan did not show any abnormal uptake in the jaw bones, arguing against malignancy -I explained to the patient that this is not usually  evaluated by oncology, but  I will go ahead and order CT maxillofacial without contrast to further assess any bony abnormality -I have referred him to [facility] dentistry for further evaluation   FDG uptake in RLL of lung -Noted incidentally on PET, possibly representing atelectasis -Repeat CT chest in 6 months to monitor any interval changes  Chemotherapy-associated anemia -Secondary to chemotherapy, as well as anemia of chronic disease and hemodilution -Hgb 12.0, improving -Patient denies any symptom of bleeding -We will monitor it for now  Orders Placed This Encounter  Procedures  . CT MAXILLOFACIAL WO CONTRAST    Standing Status:   Future    Standing Expiration Date:   05/28/2021    Order Specific Question:   ** REASON FOR EXAM (FREE TEXT)    Answer:   Abnormal dental X-ray, showing enhancement in the bilateral jaw bones, rule out malignancy    Order Specific Question:   Preferred imaging location?    Answer:   [facility] Specific Question:   Radiology Contrast Protocol - do NOT remove file path    Answer:   [email or web address]  . IR REMOVAL TUN ACCESS W/ PORT W/O FL MOD SED    Standing Status:   Future    Standing Expiration Date:   04/27/2021    Order Specific Question:   Reason for exam:    Answer:   Completed chemotherapy    Order Specific Question:   Preferred Imaging Location?    Answer:   [facility]  . CBC with Differential ([place] Only)    Standing Status:   Future    Standing Expiration Date:   04/01/2021  . CMP ([place] only)    Standing Status:   Future    Standing Expiration Date:   04/01/2021  . Lactate dehydrogenase    Standing Status:   Future    Standing Expiration Date:   04/01/2021  . Ambulatory referral to Dentistry    Referral Priority:   Routine    Referral Type:   Consultation    Referral Reason:   Specialty Services Required    Requested Specialty:   Dental General Practice    Number of Visits  Requested:   1    The total time spent in the encounter was 45 minutes, including face-to-face time with the patient, review of various tests results, order additional studies/medications, documentation, and coordination of care plan.   All questions were answered. The patient knows to call the clinic with any problems, questions or concerns. No barriers to learning was detected.  Return in 3 months for labs and clinic follow-up.   Tish Men, MD 5/6/202112:00 PM  CHIEF COMPLAINT: "I am doing fine"  INTERVAL HISTORY: [name] returns to clinic for follow-up of history of Stage IV angioimmunoblastic T-cell lymphoma.  Patient reports that he has been doing well since last visit, and denies any constitutional symptoms or recurrent lymphadenopathy.  His appetite is good.  He denies any complaint today.  REVIEW OF SYSTEMS:   Constitutional: ( - ) fevers, ( - )  chills , ( - ) night sweats Eyes: ( - ) blurriness of vision, ( - ) double vision, ( - ) watery eyes Ears, nose, mouth, throat, and face: ( - ) mucositis, ( - ) sore throat Respiratory: ( - ) cough, ( - ) dyspnea, ( - ) wheezes Cardiovascular: ( - ) palpitation, ( - ) chest discomfort, ( - ) lower extremity swelling Gastrointestinal:  ( - ) nausea, ( - )  heartburn, ( - ) change in bowel habits Skin: ( - ) abnormal skin rashes Lymphatics: ( - ) new lymphadenopathy, ( - ) easy bruising Neurological: ( - ) numbness, ( - ) tingling, ( - ) new weaknesses Behavioral/Psych: ( - ) mood change, ( - ) new changes  All other systems were reviewed with the patient and are negative.  SUMMARY OF ONCOLOGIC HISTORY: Oncology History  Angioimmunoblastic lymphoma ([place])  08/25/2019 Imaging   CT neck: IMPRESSION: Right greater than left cervical and supraclavicular adenopathy with some nodes demonstrating potential cystic change or necrosis. This may be infectious/inflammatory or neoplastic and tissue sampling is recommended.   08/25/2019  Imaging   CT chest: IMPRESSION: Multiple areas of adenopathy involving the lower neck, left axilla, subcarinal station, and largest areas of nodal masses throughout the visualized retroperitoneum. Splenomegaly.   Scattered inflammatory stranding adjacent to the distal pancreatic body-tail. 11 cm lobulated cyst in the left kidney.   1.6 and 1.1 cm irregular nodules in the medial basilar segment of the right lower lobe.   09/01/2019 Pathology Results   FINAL MICROSCOPIC DIAGNOSIS:   A. LYMPH NODE, RIGHT SUPRACLAVICULAR, EXCISION:  - Angioimmunoblastic T-cell lymphoma.   COMMENT:   Sections reveal soft tissue with mixed inflammation and foreign body  giant cell reaction with focally entrapped abnormal lymph nodes. The  abnormal lymph nodes are effaced with numerous medium sized atypical  lymphocytes with clear cytoplasm. There are admixed eosinophils, plasma  cells, and smaller lymphocytes. There is increased vascularity. These  atypical cells extend into the surrounding tissue with the mixed soft  tissue reaction. The abnormal lymphocytes are positive for CD2, CD3,  CD4, PD1, CXCL13, bcl-6, bcl-2, and CD10. CD21 and CD23 highlight  disrupted follicular dendritic networks. There is weak CD30 expression  (25%). They are negative for CD7, CD8, CD15, and CD56. CD138 highlights  plasma cells that are polytypic by light chain in situ hybridization.  EBV in situ hybridization is negative in the B-cell and T-cells. CD20  highlights residual cortical B-cells. GMS, AFB, and PAS are negative for  organisms. Flow cytometry ([id number]) reveals abundant T-cells with  decreased/absent CD7. PCR for T-cell receptor gamma gene rearrangement  is positive. Overall, these findings are consistent with involvement by  angioimmunoblastic T-cell lymphoma. A preliminary diagnosis was called  to Dr. Erik Obey on 36/62/9476. Dr. Maylon Peppers was paged on 09/19/19 for the  final diagnosis.    09/08/2019 Initial  Diagnosis   T-cell lymphoma ([place])   09/17/2019 Imaging   Pet: IMPRESSION: 1. Active lymphoma within the neck, chest, abdomen, and pelvis, as detailed above. ([place]) 4 2. Development of small bilateral pleural effusions and abdominal ascites, possibly related to fluid overload. 3. Coronary artery atherosclerosis. Aortic Atherosclerosis (ICD10-I70.0).   09/30/2019 -  Chemotherapy   The patient had DOXOrubicin (ADRIAMYCIN) chemo injection 130 mg, 50 mg/m2 = 130 mg, Intravenous,  Once, 5 of 5 cycles Administration: 130 mg (09/30/2019), 130 mg (12/08/2019), 130 mg (12/29/2019), 130 mg (01/19/2020), 130 mg (11/17/2019) palonosetron (ALOXI) injection 0.25 mg, 0.25 mg, Intravenous,  Once, 6 of 6 cycles Administration: 0.25 mg (09/30/2019), 0.25 mg (10/27/2019), 0.25 mg (11/17/2019), 0.25 mg (12/08/2019), 0.25 mg (12/29/2019), 0.25 mg (01/19/2020) pegfilgrastim (NEULASTA ONPRO KIT) injection 6 mg, 6 mg, Subcutaneous, Once, 6 of 6 cycles Administration: 6 mg (09/30/2019), 6 mg (10/27/2019), 6 mg (11/17/2019), 6 mg (12/08/2019), 6 mg (12/29/2019), 6 mg (01/19/2020) cyclophosphamide (CYTOXAN) 1,940 mg in sodium chloride 0.9 % 250 mL chemo infusion, 750 mg/m2 = 1,940 mg, Intravenous,  Once, 6 of 6 cycles Administration: 1,940 mg (09/30/2019), 1,940 mg (10/27/2019), 1,940 mg (11/17/2019), 1,940 mg (12/08/2019), 1,940 mg (12/29/2019), 1,940 mg (01/19/2020) fosaprepitant (EMEND) 150 mg in sodium chloride 0.9 % 145 mL IVPB, 150 mg, Intravenous,  Once, 1 of 1 cycle Administration: 150 mg (01/19/2020) brentuximab vedotin (ADCETRIS) 135 mg in sodium chloride 0.9 % 100 mL chemo infusion, 135 mg (75 % of original dose 180 mg), Intravenous,  Once, 6 of 6 cycles Dose modification: 180 mg (original dose 180 mg, Cycle 1, Reason: Other (see comments), Comment: Maximum dose Adcetris ), 135 mg (original dose 180 mg, Cycle 1, Reason: Change in SCr/CrCl) Administration: 135 mg (09/30/2019), 180 mg (10/27/2019), 180 mg (11/17/2019), 180 mg (12/08/2019),  180 mg (12/29/2019), 180 mg (01/19/2020) fosaprepitant (EMEND) 150 mg, dexamethasone (DECADRON) 12 mg in sodium chloride 0.9 % 145 mL IVPB, , Intravenous,  Once, 5 of 5 cycles Administration:  (09/30/2019),  (10/27/2019),  (11/17/2019),  (12/08/2019),  (12/29/2019)  for chemotherapy treatment.      I have reviewed the past medical history, past surgical history, social history and family history with the patient and they are unchanged from previous note.  ALLERGIES:  is allergic to doxycycline.  MEDICATIONS:  Current Outpatient Medications  Medication Sig Dispense Refill  . allopurinol (ZYLOPRIM) 300 MG tablet TAKE 1 TABLET BY MOUTH EVERY DAY 90 tablet 1  . carvedilol (COREG) 12.5 MG tablet Take 1 tablet (12.5 mg total) by mouth 2 (two) times daily with a meal. 180 tablet 2  . furosemide (LASIX) 40 MG tablet Take 1 tablet (40 mg total) by mouth daily as needed for fluid or edema. 30 tablet 11  . hydrochlorothiazide (HYDRODIURIL) 25 MG tablet Take 1 tablet (25 mg total) by mouth daily. 90 tablet 1  . losartan (COZAAR) 100 MG tablet Take 1 tablet (100 mg total) by mouth daily. 90 tablet 1  . metFORMIN (GLUCOPHAGE) 500 MG tablet Take 1 tablet (500 mg total) by mouth daily with breakfast. 90 tablet 1  . potassium chloride (KLOR-CON M10) 10 MEQ tablet Take 1 tablet (10 mEq total) by mouth daily. 90 tablet 1  . simvastatin (ZOCOR) 80 MG tablet Take 0.5 tablets (40 mg total) by mouth daily. 45 tablet 1  . HYDROcodone-acetaminophen (NORCO) 7.5-325 MG tablet Take 1 tablet by mouth 3 (three) times daily as needed for moderate pain. (Patient not taking: Reported on 02/26/2020) 60 tablet 0  . LORazepam (ATIVAN) 0.5 MG tablet Take 1 tablet (0.5 mg total) by mouth every 6 (six) hours as needed (Nausea or vomiting). (Patient not taking: Reported on 02/26/2020) 30 tablet 0  . Omega-3 Fatty Acids (FISH OIL) 1000 MG CAPS Take 1,000 mg by mouth daily.    . ondansetron (ZOFRAN) 8 MG tablet Take 1 tablet (8 mg total) by mouth  2 (two) times daily as needed for refractory nausea / vomiting. Start on day 3 after cyclophosphamide chemotherapy. (Patient not taking: Reported on 02/26/2020) 30 tablet 1  . predniSONE (DELTASONE) 20 MG tablet Take 100 mg by mouth See admin instructions. Take 100 mg daily for 5 days on days 1 through 5 of chemotherapy    . prochlorperazine (COMPAZINE) 10 MG tablet Take 1 tablet (10 mg total) by mouth every 6 (six) hours as needed (Nausea or vomiting). (Patient not taking: Reported on 02/26/2020) 30 tablet 6  . promethazine (PHENERGAN) 12.5 MG tablet TAKE 1 TABLET (12.5 MG TOTAL) BY MOUTH EVERY 6 (SIX) HOURS AS NEEDED FOR NAUSEA OR VOMITING. (Patient not taking: Reported  on 02/26/2020) 30 tablet 0   No current facility-administered medications for this visit.    PHYSICAL EXAMINATION: ECOG PERFORMANCE STATUS: 2 - Symptomatic, <50% confined to bed  Today's Vitals   02/26/20 1115  BP: 119/69  Pulse: 66  Resp: 18  Temp: (!) 97.1 F (36.2 C)  TempSrc: Temporal  SpO2: 99%  Weight: 265 lb 6.4 oz (120.4 kg)  Height: 6' (1.829 m)  PainSc: 0-No pain   Body mass index is 35.99 kg/m.  Filed Weights   02/26/20 1115  Weight: 265 lb 6.4 oz (120.4 kg)    GENERAL: alert, no distress and comfortable SKIN: skin color, texture, turgor are normal, no rashes or significant lesions EYES: conjunctiva are pink and non-injected, sclera clear OROPHARYNX: no exudate, no erythema; lips, buccal mucosa, and tongue normal  NECK: supple, non-tender LYMPH:  no palpable lymphadenopathy in the cervical LUNGS: clear to auscultation with normal breathing effort HEART: regular rate & rhythm and no murmurs and no lower extremity edema ABDOMEN: soft, non-tender, non-distended, normal bowel sounds Musculoskeletal: no cyanosis of digits and no clubbing  PSYCH: alert & oriented x 3, fluent speech  LABORATORY DATA:  I have reviewed the data as listed    Component Value Date/Time   NA 136 02/26/2020 1100   K 4.0  02/26/2020 1100   CL 100 02/26/2020 1100   CO2 27 02/26/2020 1100   GLUCOSE 112 (H) 02/26/2020 1100   GLUCOSE 101 (H) 08/30/2006 1117   BUN 18 02/26/2020 1100   CREATININE 1.05 02/26/2020 1100   CREATININE 1.41 (H) 06/06/2019 1528   CALCIUM 9.9 02/26/2020 1100   PROT 7.1 02/26/2020 1100   ALBUMIN 4.4 02/26/2020 1100   AST 12 (L) 02/26/2020 1100   ALT 9 02/26/2020 1100   ALKPHOS 80 02/26/2020 1100   BILITOT 0.6 02/26/2020 1100   GFRNONAA >60 02/26/2020 1100   GFRAA >60 02/26/2020 1100    No results found for: SPEP, UPEP  Lab Results  Component Value Date   WBC 8.2 02/26/2020   NEUTROABS 5.8 02/26/2020   HGB 12.0 (L) 02/26/2020   HCT 35.5 (L) 02/26/2020   MCV 96.7 02/26/2020   PLT 327 02/26/2020      Chemistry      Component Value Date/Time   NA 136 02/26/2020 1100   K 4.0 02/26/2020 1100   CL 100 02/26/2020 1100   CO2 27 02/26/2020 1100   BUN 18 02/26/2020 1100   CREATININE 1.05 02/26/2020 1100   CREATININE 1.41 (H) 06/06/2019 1528      Component Value Date/Time   CALCIUM 9.9 02/26/2020 1100   ALKPHOS 80 02/26/2020 1100   AST 12 (L) 02/26/2020 1100   ALT 9 02/26/2020 1100   BILITOT 0.6 02/26/2020 1100       RADIOGRAPHIC STUDIES: I have personally reviewed the radiological images as listed below and agreed with the findings in the report. NM PET Image Restag (PS) Skull Base To Thigh  Result Date: 02/23/2020 CLINICAL DATA:  Subsequent treatment strategy for T-cell lymphoma. Patient status post 6 cycles chemotherapy. EXAM: NUCLEAR MEDICINE PET SKULL BASE TO THIGH TECHNIQUE: 13.1 mCi F-18 FDG was injected intravenously. Full-ring PET imaging was performed from the skull base to thigh after the radiotracer. CT data was obtained and used for attenuation correction and anatomic localization. Fasting blood glucose: 125 mg/dl COMPARISON:  PET-CT 12/24/2019, 09/17/2019 FINDINGS: Mediastinal blood pool activity: SUV max 2.5 Liver activity: SUV max 3.5 NECK: No  hypermetabolic lymph nodes in the neck. Incidental CT findings:  Port in the anterior chest wall with tip in distal SVC. CHEST: Is no hypermetabolic mediastinal lymph nodes. Hypermetabolic supraclavicular axillary lymph nodes. Incidental CT findings: Within the medial aspect of RIGHT lower lobe is new 1 cm subpleural density (image 44/8) with mild metabolic activity: SUV max equal 3.3. There is mild atelectasis on comparison exam through this region and associated osteophytosis. No additional nodules are present. ABDOMEN/PELVIS: No hypermetabolic lymph nodes in the abdomen pelvis. Normal spleen. No abnormal metabolic activity liver. Incidental CT findings: Large benign LEFT renal cysts. Atherosclerotic calcification of the aorta. SKELETON: No focal hypermetabolic activity to suggest skeletal metastasis. No focal activity appreciated in the LEFT iliac bone as previously described. Incidental CT findings: none IMPRESSION: 1. No evidence of residual active lymphoma. No enlarged lymph nodes. No lymph nodes with activity above background blood. ( [place] 1). 2. Single new focus of hypermetabolic nodularity in the medial aspect of the RIGHT lower lobe along the spine. This is through a region of osteophytosis and may represent focus of atelectasis. Recommend attention on follow-up. Electronically Signed   By: Suzy Bouchard M.D.   On: 02/23/2020 12:10

## 2020-03-03 ENCOUNTER — Other Ambulatory Visit: Payer: Self-pay | Admitting: Radiology

## 2020-03-04 ENCOUNTER — Other Ambulatory Visit: Payer: Self-pay

## 2020-03-04 ENCOUNTER — Ambulatory Visit (HOSPITAL_COMMUNITY)
Admission: RE | Admit: 2020-03-04 | Discharge: 2020-03-04 | Disposition: A | Discharge status: Home / Self Care | Payer: HMO | Source: Ambulatory Visit | Attending: Hematology | Admitting: Hematology

## 2020-03-04 ENCOUNTER — Observation Stay (HOSPITAL_COMMUNITY)
Admission: RE | Admit: 2020-03-04 | Discharge: 2020-03-04 | Disposition: A | Discharge status: Home / Self Care | Payer: HMO | Source: Ambulatory Visit | Attending: Hematology | Admitting: Hematology

## 2020-03-04 ENCOUNTER — Encounter (HOSPITAL_COMMUNITY): Payer: Self-pay

## 2020-03-04 DIAGNOSIS — Z7984 Long term (current) use of oral hypoglycemic drugs: Secondary | ICD-10-CM | POA: Insufficient documentation

## 2020-03-04 DIAGNOSIS — Z833 Family history of diabetes mellitus: Secondary | ICD-10-CM | POA: Insufficient documentation

## 2020-03-04 DIAGNOSIS — M1990 Unspecified osteoarthritis, unspecified site: Secondary | ICD-10-CM | POA: Diagnosis not present

## 2020-03-04 DIAGNOSIS — K219 Gastro-esophageal reflux disease without esophagitis: Secondary | ICD-10-CM | POA: Diagnosis not present

## 2020-03-04 DIAGNOSIS — Z96653 Presence of artificial knee joint, bilateral: Secondary | ICD-10-CM | POA: Insufficient documentation

## 2020-03-04 DIAGNOSIS — Z8249 Family history of ischemic heart disease and other diseases of the circulatory system: Secondary | ICD-10-CM | POA: Diagnosis not present

## 2020-03-04 DIAGNOSIS — I10 Essential (primary) hypertension: Secondary | ICD-10-CM | POA: Insufficient documentation

## 2020-03-04 DIAGNOSIS — Z79899 Other long term (current) drug therapy: Secondary | ICD-10-CM | POA: Insufficient documentation

## 2020-03-04 DIAGNOSIS — Z87891 Personal history of nicotine dependence: Secondary | ICD-10-CM | POA: Diagnosis not present

## 2020-03-04 DIAGNOSIS — E119 Type 2 diabetes mellitus without complications: Secondary | ICD-10-CM | POA: Insufficient documentation

## 2020-03-04 DIAGNOSIS — C8440 Peripheral T-cell lymphoma, not classified, unspecified site: Secondary | ICD-10-CM | POA: Diagnosis not present

## 2020-03-04 DIAGNOSIS — Z881 Allergy status to other antibiotic agents status: Secondary | ICD-10-CM | POA: Diagnosis not present

## 2020-03-04 DIAGNOSIS — Z452 Encounter for adjustment and management of vascular access device: Secondary | ICD-10-CM | POA: Insufficient documentation

## 2020-03-04 HISTORY — PX: IR REMOVAL TUN ACCESS W/ PORT W/O FL MOD SED: IMG2290

## 2020-03-04 LAB — CBC WITH DIFFERENTIAL/PLATELET
Abs Immature Granulocytes: 0.03 10*3/uL (ref 0.00–0.07)
Basophils Absolute: 0.1 10*3/uL (ref 0.0–0.1)
Basophils Relative: 1 %
Eosinophils Absolute: 0.4 10*3/uL (ref 0.0–0.5)
Eosinophils Relative: 5 %
HCT: 38.5 % — ABNORMAL LOW (ref 39.0–52.0)
Hemoglobin: 12.9 g/dL — ABNORMAL LOW (ref 13.0–17.0)
Immature Granulocytes: 0 %
Lymphocytes Relative: 10 %
Lymphs Abs: 0.7 10*3/uL (ref 0.7–4.0)
MCH: 32.5 pg (ref 26.0–34.0)
MCHC: 33.5 g/dL (ref 30.0–36.0)
MCV: 97 fL (ref 80.0–100.0)
Monocytes Absolute: 0.8 10*3/uL (ref 0.1–1.0)
Monocytes Relative: 10 %
Neutro Abs: 5.7 10*3/uL (ref 1.7–7.7)
Neutrophils Relative %: 74 %
Platelets: 255 10*3/uL (ref 150–400)
RBC: 3.97 MIL/uL — ABNORMAL LOW (ref 4.22–5.81)
RDW: 13.2 % (ref 11.5–15.5)
WBC: 7.7 10*3/uL (ref 4.0–10.5)
nRBC: 0 % (ref 0.0–0.2)

## 2020-03-04 LAB — PROTIME-INR
INR: 1 (ref 0.8–1.2)
Prothrombin Time: 12.3 s (ref 11.4–15.2)

## 2020-03-04 LAB — GLUCOSE, CAPILLARY: Glucose-Capillary: 98 mg/dL (ref 70–99)

## 2020-03-04 MED ORDER — CEFAZOLIN SODIUM-DEXTROSE 2-4 GM/100ML-% IV SOLN
INTRAVENOUS | Status: AC
  Administered 2020-03-04: 2 g via INTRAVENOUS
  Filled 2020-03-04: qty 100

## 2020-03-04 MED ORDER — SODIUM CHLORIDE 0.9 % IV SOLN: INTRAVENOUS | Status: DC

## 2020-03-04 MED ORDER — FENTANYL CITRATE (PF) 100 MCG/2ML IJ SOLN
INTRAMUSCULAR | Status: AC
  Filled 2020-03-04: qty 2

## 2020-03-04 MED ORDER — MIDAZOLAM HCL 2 MG/2ML IJ SOLN
INTRAMUSCULAR | Status: AC | PRN
  Administered 2020-03-04 (×2): 1 mg via INTRAVENOUS

## 2020-03-04 MED ORDER — MIDAZOLAM HCL 2 MG/2ML IJ SOLN
INTRAMUSCULAR | Status: AC
  Filled 2020-03-04: qty 2

## 2020-03-04 MED ORDER — LIDOCAINE-EPINEPHRINE (PF) 1 %-1:200000 IJ SOLN
INTRAMUSCULAR | Status: AC | PRN
  Administered 2020-03-04: 10 mL via INTRADERMAL

## 2020-03-04 MED ORDER — CEFAZOLIN SODIUM-DEXTROSE 2-4 GM/100ML-% IV SOLN: 2.0000 g | INTRAVENOUS | Status: AC

## 2020-03-04 MED ORDER — FENTANYL CITRATE (PF) 100 MCG/2ML IJ SOLN
INTRAMUSCULAR | Status: AC | PRN
  Administered 2020-03-04 (×2): 50 ug via INTRAVENOUS

## 2020-03-04 MED ORDER — LIDOCAINE-EPINEPHRINE 1 %-1:100000 IJ SOLN
INTRAMUSCULAR | Status: AC
  Filled 2020-03-04: qty 1

## 2020-03-04 NOTE — H&P (Signed)
Chief Complaint: Patient was seen in consultation today for lymphoma/Port-a-cath removal.  Referring Physician(s): Zhao,Yan  Supervising Physician: Daryll Brod  Patient Status: [name] [name] - Out-pt  History of Present Illness: [name] [name] is a 79 y.o. male with a past medical history of hypertension, myasthenia gravis, hiatal hernia, GERD, diabetes mellitus, lymphoma, and arthritis. He was unfortunately diagnosed with stage IV angioimmunoblastic T-cell lymphoma in 08/2019. His cancer is managed by Dr. Maylon Peppers. He had a Port-a-cath placed in IR 09/16/2019 by Dr. Kathlene Cote. He is currently on surveillance.   IR requested by Dr. Maylon Peppers for possible Port-a-cath removal. Patient awake and alert sitting in bed with no complaints at this time. Denies fever, chills, chest pain, dyspnea, abdominal pain, or headache.   Past Medical History:  Diagnosis Date  . Arthritis   . Complication of anesthesia     had spinal with knee surgeries  -"heart rate too low" for general  . Detached retina, left    L eye normal vision, reports that he has had a retina procedure in a doctor's office at [facility]   . Diabetes mellitus without complication ([place])   . Dysrhythmia    slight irregular rate  . GERD (gastroesophageal reflux disease)    hx no problems now  . H/O echocardiogram 2014  . History of hiatal hernia    ?20 yrs ago  . Hyperopia 2016  . Hypertension   . Lumbar stenosis with neurogenic claudication   . MG, ocular (myasthenia gravis) ([place])    ? of L eye,   . Prediabetes   . Presbyopia OU 2016    Past Surgical History:  Procedure Laterality Date  . COLONOSCOPY    . HERNIA REPAIR     bilateral inguinal hernia  . HERNIA REPAIR     umbilical  . IR IMAGING GUIDED PORT INSERTION  09/16/2019  . JOINT REPLACEMENT  2009   right knee  . LUMBAR LAMINECTOMY/DECOMPRESSION MICRODISCECTOMY N/A 06/06/2017   Procedure: Decompression L3-5, insitu fusion L3-5 ;  Surgeon: Melina Schools, MD;  Location: [place];   Service: Orthopedics;  Laterality: N/A;  . LYMPH NODE BIOPSY Right 09/01/2019   Procedure: CERVICAL LYMPH NODE BIOPSY;  Surgeon: [name], MD;  Location: [place];  Service: ENT;  Laterality: Right;  . MIDDLE EAR SURGERY Right    ear -patched hole in ear drum  . RIGHT/LEFT HEART CATH AND CORONARY ANGIOGRAPHY N/A 11/11/2019   Procedure: RIGHT/LEFT HEART CATH AND CORONARY ANGIOGRAPHY;  Surgeon: Jolaine Artist, MD;  Location: [place] [facility];  Service: Cardiovascular;  Laterality: N/A;  . TOTAL KNEE ARTHROPLASTY  09/10/2012   Procedure: TOTAL KNEE ARTHROPLASTY;  Surgeon: Tobi Bastos, MD;  Location: [facility];  Service: Orthopedics;  Laterality: Left;  . TOTAL SHOULDER ARTHROPLASTY Right 06/22/2016   Procedure: RIGHT TOTAL SHOULDER ARTHROPLASTY;  Surgeon: [name], MD;  Location: [place];  Service: Orthopedics;  Laterality: Right;    Allergies: Doxycycline  Medications: Prior to Admission medications   Medication Sig Start Date End Date Taking? Authorizing Provider  allopurinol (ZYLOPRIM) 300 MG tablet TAKE 1 TABLET BY MOUTH EVERY DAY 12/26/19   Tish Men, MD  carvedilol (COREG) 12.5 MG tablet Take 1 tablet (12.5 mg total) by mouth 2 (two) times daily with a meal. 12/19/19   Colon Branch, MD  furosemide (LASIX) 40 MG tablet Take 1 tablet (40 mg total) by mouth daily as needed for fluid or edema. 10/20/19 10/19/20  Kathie Dike, MD  hydrochlorothiazide (HYDRODIURIL) 25 MG tablet  Take 1 tablet (25 mg total) by mouth daily. 11/17/19   Colon Branch, MD  HYDROcodone-acetaminophen (NORCO) 7.5-325 MG tablet Take 1 tablet by mouth 3 (three) times daily as needed for moderate pain. Patient not taking: Reported on 02/26/2020 10/08/19   Colon Branch, MD  LORazepam (ATIVAN) 0.5 MG tablet Take 1 tablet (0.5 mg total) by mouth every 6 (six) hours as needed (Nausea or vomiting). Patient not taking: Reported on 02/26/2020 10/08/19   Tish Men, MD  losartan (COZAAR) 100 MG tablet Take 1 tablet (100 mg  total) by mouth daily. 11/20/19   Colon Branch, MD  metFORMIN (GLUCOPHAGE) 500 MG tablet Take 1 tablet (500 mg total) by mouth daily with breakfast. 10/07/19   Colon Branch, MD  Omega-3 Fatty Acids (FISH OIL) 1000 MG CAPS Take 1,000 mg by mouth daily.    [provider]  ondansetron (ZOFRAN) 8 MG tablet Take 1 tablet (8 mg total) by mouth 2 (two) times daily as needed for refractory nausea / vomiting. Start on day 3 after cyclophosphamide chemotherapy. Patient not taking: Reported on 02/26/2020 09/30/19   Tish Men, MD  potassium chloride (KLOR-CON M10) 10 MEQ tablet Take 1 tablet (10 mEq total) by mouth daily. 11/17/19   Colon Branch, MD  predniSONE (DELTASONE) 20 MG tablet Take 100 mg by mouth See admin instructions. Take 100 mg daily for 5 days on days 1 through 5 of chemotherapy 10/23/19   [provider]  prochlorperazine (COMPAZINE) 10 MG tablet Take 1 tablet (10 mg total) by mouth every 6 (six) hours as needed (Nausea or vomiting). Patient not taking: Reported on 02/26/2020 09/30/19   Tish Men, MD  promethazine (PHENERGAN) 12.5 MG tablet TAKE 1 TABLET (12.5 MG TOTAL) BY MOUTH EVERY 6 (SIX) HOURS AS NEEDED FOR NAUSEA OR VOMITING. Patient not taking: Reported on 02/26/2020 11/19/19   Tish Men, MD  simvastatin (ZOCOR) 80 MG tablet Take 0.5 tablets (40 mg total) by mouth daily. 10/07/18   Colon Branch, MD     Family History  Problem Relation Age of Onset  . Diabetes Brother   . Colon cancer Brother 41       dx in 2018  . Hypertension Sister   . Prostate cancer Neg Hx   . Stroke Neg Hx   . CAD Neg Hx     Social History   Socioeconomic History  . Marital status: Married    Spouse name: Not on file  . Number of children: 4  . Years of education: Not on file  . Highest education level: Not on file  Occupational History  . Occupation: Audiological scientist-- retired 2010  Tobacco Use  . Smoking status: Former Smoker    Packs/day: 0.50    Years: 2.00    Pack years: 1.00    Types:  Cigarettes    Quit date: 06/09/1966    Years since quitting: 53.7  . Smokeless tobacco: Never Used  Substance and Sexual Activity  . Alcohol use: No    Alcohol/week: 0.0 standard drinks    Comment: none  . Drug use: No  . Sexual activity: Yes    Partners: Female  Other Topics Concern  . Not on file  Social History Narrative   married, lives w/ second wife, 2 living children, lost 2 kids     Has a small dog.     Social Determinants of Health   Financial Resource Strain:   . Difficulty of Paying Living Expenses:  Food Insecurity:   . Worried About Charity fundraiser in the Last Year:   . Arboriculturist in the Last Year:   Transportation Needs:   . Film/video editor (Medical):   Marland Kitchen Lack of Transportation (Non-Medical):   Physical Activity:   . Days of Exercise per Week:   . Minutes of Exercise per Session:   Stress:   . Feeling of Stress :   Social Connections:   . Frequency of Communication with Friends and Family:   . Frequency of Social Gatherings with Friends and Family:   . Attends Religious Services:   . Active Member of Clubs or Organizations:   . Attends Archivist Meetings:   Marland Kitchen Marital Status:      Review of Systems: A 12 point ROS discussed and pertinent positives are indicated in the HPI above.  All other systems are negative.  Review of Systems  Constitutional: Negative for chills and fever.  Respiratory: Negative for shortness of breath and wheezing.   Cardiovascular: Negative for chest pain and palpitations.  Gastrointestinal: Negative for abdominal pain.  Neurological: Negative for headaches.  Psychiatric/Behavioral: Negative for behavioral problems and confusion.    Vital Signs: BP 129/75 (BP Location: Right Arm)   Pulse 68   Temp 98.2 F (36.8 C) (Oral)   Resp 18   Ht 6' (1.829 m)   Wt 254 lb (115.2 kg)   SpO2 100%   BMI 34.45 kg/m   Physical Exam Vitals and nursing note reviewed.  Constitutional:      General: He is  not in acute distress.    Appearance: Normal appearance.  Cardiovascular:     Rate and Rhythm: Normal rate and regular rhythm.     Heart sounds: Normal heart sounds. No murmur.  Pulmonary:     Effort: Pulmonary effort is normal. No respiratory distress.     Breath sounds: Normal breath sounds. No wheezing.  Skin:    General: Skin is warm and dry.  Neurological:     Mental Status: He is alert and oriented to person, place, and time.      MD Evaluation Airway: WNL Heart: WNL Abdomen: WNL Chest/ Lungs: WNL ASA  Classification: 3 Mallampati/Airway Score: Two   Imaging: NM PET Image Restag (PS) Skull Base To Thigh  Result Date: 02/23/2020 CLINICAL DATA:  Subsequent treatment strategy for T-cell lymphoma. Patient status post 6 cycles chemotherapy. EXAM: NUCLEAR MEDICINE PET SKULL BASE TO THIGH TECHNIQUE: 13.1 mCi F-18 FDG was injected intravenously. Full-ring PET imaging was performed from the skull base to thigh after the radiotracer. CT data was obtained and used for attenuation correction and anatomic localization. Fasting blood glucose: 125 mg/dl COMPARISON:  PET-CT 12/24/2019, 09/17/2019 FINDINGS: Mediastinal blood pool activity: SUV max 2.5 Liver activity: SUV max 3.5 NECK: No hypermetabolic lymph nodes in the neck. Incidental CT findings: Port in the anterior chest wall with tip in distal SVC. CHEST: Is no hypermetabolic mediastinal lymph nodes. Hypermetabolic supraclavicular axillary lymph nodes. Incidental CT findings: Within the medial aspect of RIGHT lower lobe is new 1 cm subpleural density (image 44/8) with mild metabolic activity: SUV max equal 3.3. There is mild atelectasis on comparison exam through this region and associated osteophytosis. No additional nodules are present. ABDOMEN/PELVIS: No hypermetabolic lymph nodes in the abdomen pelvis. Normal spleen. No abnormal metabolic activity liver. Incidental CT findings: Large benign LEFT renal cysts. Atherosclerotic calcification  of the aorta. SKELETON: No focal hypermetabolic activity to suggest skeletal metastasis. No focal  activity appreciated in the LEFT iliac bone as previously described. Incidental CT findings: none IMPRESSION: 1. No evidence of residual active lymphoma. No enlarged lymph nodes. No lymph nodes with activity above background blood. ( [place] 1). 2. Single new focus of hypermetabolic nodularity in the medial aspect of the RIGHT lower lobe along the spine. This is through a region of osteophytosis and may represent focus of atelectasis. Recommend attention on follow-up. Electronically Signed   By: Suzy Bouchard M.D.   On: 02/23/2020 12:10    Labs:  CBC: Recent Labs    12/29/19 1021 01/19/20 1030 02/26/20 1100 03/04/20 1020  WBC 7.0 7.8 8.2 7.7  HGB 10.8* 11.2* 12.0* 12.9*  HCT 32.0* 33.2* 35.5* 38.5*  PLT 382 320 327 255    COAGS: Recent Labs    06/16/19 2048 09/16/19 1320 03/04/20 1020  INR 1.1 1.2 1.0  APTT  --  29  --     BMP: Recent Labs    12/08/19 1130 12/29/19 1021 01/19/20 1030 02/26/20 1100  NA 134* 134* 137 136  K 4.1 3.8 4.0 4.0  CL 100 99 101 100  CO2 26 26 27 27   GLUCOSE 102* 105* 109* 112*  BUN 14 10 16 18   CALCIUM 9.6 9.3 9.6 9.9  CREATININE 0.84 0.94 0.96 1.05  GFRNONAA >60 >60 >60 >60  GFRAA >60 >60 >60 >60    LIVER FUNCTION TESTS: Recent Labs    12/08/19 1130 12/29/19 1021 01/19/20 1030 02/26/20 1100  BILITOT 0.3 0.4 0.3 0.6  AST 11* 11* 13* 12*  ALT 9 8 9 9   ALKPHOS 79 80 70 80  PROT 6.8 7.1 7.1 7.1  ALBUMIN 3.9 4.2 4.1 4.4     Assessment and Plan:  Lymphoma (stage IV, T-cell), currently on surveillance. Plan for Port-a-cath removal today in IR. Patient is NPO. Afebrile.  Risks and benefits of Port-a-catheter removal were discussed with the patient including, but not limited to bleeding, infection, or pneumothorax. All of the patient's questions were answered, patient is agreeable to proceed. Consent signed and in  chart.   Thank you for this interesting consult.  I greatly enjoyed meeting BALIAN CHAUDOIN and look forward to participating in their care.  A copy of this report was sent to the requesting provider on this date.  Electronically Signed: Earley Abide, PA-C 03/04/2020, 10:49 AM   I spent a total of 25 Minutes in face to face in clinical consultation, greater than 50% of which was counseling/coordinating care for lymphoma/Port-a-cath removal.

## 2020-03-04 NOTE — Procedures (Signed)
Interventional Radiology Procedure Note  Procedure: RT IJ PORT REMOVAL  Complications: None  Estimated Blood Loss: MIN

## 2020-03-04 NOTE — Discharge Instructions (Addendum)
Implanted Port Removal, Care After °This sheet gives you information about how to care for yourself after your procedure. Your health care provider may also give you more specific instructions. If you have problems or questions, contact your health care provider. °What can I expect after the procedure? °After the procedure, it is common to have: °· Soreness or pain near your incision. °· Some swelling or bruising near your incision. °Follow these instructions at home: °Medicines °· Take over-the-counter and prescription medicines only as told by your health care provider. °· If you were prescribed an antibiotic medicine, take it as told by your health care provider. Do not stop taking the antibiotic even if you start to feel better. °Bathing °· Do not take baths, swim, or use a hot tub until your health care provider approves. Ask your health care provider if you can take showers. You may only be allowed to take sponge baths. °Incision care ° °· Follow instructions from your health care provider about how to take care of your incision. Make sure you: °? Wash your hands with soap and water before you change your bandage (dressing). If soap and water are not available, use hand sanitizer. °? Change your dressing as told by your health care provider. °? Keep your dressing dry. °? Leave stitches (sutures), skin glue, or adhesive strips in place. These skin closures may need to stay in place for 2 weeks or longer. If adhesive strip edges start to loosen and curl up, you may trim the loose edges. Do not remove adhesive strips completely unless your health care provider tells you to do that. °· Check your incision area every day for signs of infection. Check for: °? More redness, swelling, or pain. °? More fluid or blood. °? Warmth. °? Pus or a bad smell. °Driving ° °· Do not drive for 24 hours if you were given a medicine to help you relax (sedative) during your procedure. °· If you did not receive a sedative, ask your  health care provider when it is safe to drive. °Activity °· Return to your normal activities as told by your health care provider. Ask your health care provider what activities are safe for you. °· Do not lift anything that is heavier than 10 lb (4.5 kg), or the limit that you are told, until your health care provider says that it is safe. °· Do not do activities that involve lifting your arms over your head. °General instructions °· Do not use any products that contain nicotine or tobacco, such as cigarettes and e-cigarettes. These can delay healing. If you need help quitting, ask your health care provider. °· Keep all follow-up visits as told by your health care provider. This is important. °Contact a health care provider if: °· You have more redness, swelling, or pain around your incision. °· You have more fluid or blood coming from your incision. °· Your incision feels warm to the touch. °· You have pus or a bad smell coming from your incision. °· You have pain that is not relieved by your pain medicine. °Get help right away if you have: °· A fever or chills. °· Chest pain. °· Difficulty breathing. °Summary °· After the procedure, it is common to have pain, soreness, swelling, or bruising near your incision. °· If you were prescribed an antibiotic medicine, take it as told by your health care provider. Do not stop taking the antibiotic even if you start to feel better. °· Do not drive for 24 hours   if you were given a sedative during your procedure. °· Return to your normal activities as told by your health care provider. Ask your health care provider what activities are safe for you. °This information is not intended to replace advice given to you by your health care provider. Make sure you discuss any questions you have with your health care provider. °Document Revised: 11/22/2017 Document Reviewed: 11/22/2017 °Elsevier Patient Education © 2020 [employer] ° ° ° °Moderate Conscious Sedation, Adult, Care  After °These instructions provide you with information about caring for yourself after your procedure. Your health care provider may also give you more specific instructions. Your treatment has been planned according to current medical practices, but problems sometimes occur. Call your health care provider if you have any problems or questions after your procedure. °What can I expect after the procedure? °After your procedure, it is common: °· To feel sleepy for several hours. °· To feel clumsy and have poor balance for several hours. °· To have poor judgment for several hours. °· To vomit if you eat too soon. °Follow these instructions at home: °For at least 24 hours after the procedure: ° °· Do not: °? Participate in activities where you could fall or become injured. °? Drive. °? Use heavy machinery. °? Drink alcohol. °? Take sleeping pills or medicines that cause drowsiness. °? Make important decisions or sign legal documents. °? Take care of children on your own. °· Rest. °Eating and drinking °· Follow the diet recommended by your health care provider. °· If you vomit: °? Drink water, juice, or soup when you can drink without vomiting. °? Make sure you have little or no nausea before eating solid foods. °General instructions °· Have a responsible adult stay with you until you are awake and alert. °· Take over-the-counter and prescription medicines only as told by your health care provider. °· If you smoke, do not smoke without supervision. °· Keep all follow-up visits as told by your health care provider. This is important. °Contact a health care provider if: °· You keep feeling nauseous or you keep vomiting. °· You feel light-headed. °· You develop a rash. °· You have a fever. °Get help right away if: °· You have trouble breathing. °This information is not intended to replace advice given to you by your health care provider. Make sure you discuss any questions you have with your health care provider. °Document  Revised: 09/21/2017 Document Reviewed: 01/29/2016 °Elsevier Patient Education © 2020 [employer] ° °

## 2020-03-10 ENCOUNTER — Ambulatory Visit (HOSPITAL_BASED_OUTPATIENT_CLINIC_OR_DEPARTMENT_OTHER)
Admission: RE | Admit: 2020-03-10 | Discharge: 2020-03-10 | Disposition: A | Discharge status: Home / Self Care | Payer: HMO | Source: Ambulatory Visit | Attending: Hematology | Admitting: Hematology

## 2020-03-10 ENCOUNTER — Other Ambulatory Visit: Payer: Self-pay

## 2020-03-10 DIAGNOSIS — J320 Chronic maxillary sinusitis: Secondary | ICD-10-CM | POA: Diagnosis not present

## 2020-03-10 DIAGNOSIS — M2651 Abnormal jaw closure: Secondary | ICD-10-CM | POA: Insufficient documentation

## 2020-03-29 ENCOUNTER — Encounter (HOSPITAL_COMMUNITY): Payer: Self-pay | Admitting: Internal Medicine

## 2020-03-29 ENCOUNTER — Ambulatory Visit (HOSPITAL_COMMUNITY)
Admission: RE | Admit: 2020-03-29 | Discharge: 2020-03-29 | Disposition: A | Discharge status: Home / Self Care | Payer: HMO | Source: Ambulatory Visit | Attending: Internal Medicine | Admitting: Internal Medicine

## 2020-03-29 ENCOUNTER — Other Ambulatory Visit: Payer: Self-pay

## 2020-03-29 VITALS — BP 124/78 | HR 77 | Wt 276.8 lb

## 2020-03-29 DIAGNOSIS — Z881 Allergy status to other antibiotic agents status: Secondary | ICD-10-CM | POA: Diagnosis not present

## 2020-03-29 DIAGNOSIS — Z9221 Personal history of antineoplastic chemotherapy: Secondary | ICD-10-CM | POA: Insufficient documentation

## 2020-03-29 DIAGNOSIS — Z8249 Family history of ischemic heart disease and other diseases of the circulatory system: Secondary | ICD-10-CM | POA: Diagnosis not present

## 2020-03-29 DIAGNOSIS — Z7984 Long term (current) use of oral hypoglycemic drugs: Secondary | ICD-10-CM | POA: Diagnosis not present

## 2020-03-29 DIAGNOSIS — E785 Hyperlipidemia, unspecified: Secondary | ICD-10-CM | POA: Diagnosis not present

## 2020-03-29 DIAGNOSIS — G7000 Myasthenia gravis without (acute) exacerbation: Secondary | ICD-10-CM | POA: Diagnosis not present

## 2020-03-29 DIAGNOSIS — C8590 Non-Hodgkin lymphoma, unspecified, unspecified site: Secondary | ICD-10-CM

## 2020-03-29 DIAGNOSIS — Z833 Family history of diabetes mellitus: Secondary | ICD-10-CM | POA: Insufficient documentation

## 2020-03-29 DIAGNOSIS — Z8572 Personal history of non-Hodgkin lymphomas: Secondary | ICD-10-CM | POA: Diagnosis not present

## 2020-03-29 DIAGNOSIS — I10 Essential (primary) hypertension: Secondary | ICD-10-CM | POA: Diagnosis not present

## 2020-03-29 DIAGNOSIS — Z7982 Long term (current) use of aspirin: Secondary | ICD-10-CM | POA: Diagnosis not present

## 2020-03-29 DIAGNOSIS — I2510 Atherosclerotic heart disease of native coronary artery without angina pectoris: Secondary | ICD-10-CM | POA: Insufficient documentation

## 2020-03-29 DIAGNOSIS — Z79899 Other long term (current) drug therapy: Secondary | ICD-10-CM | POA: Insufficient documentation

## 2020-03-29 DIAGNOSIS — M1990 Unspecified osteoarthritis, unspecified site: Secondary | ICD-10-CM | POA: Insufficient documentation

## 2020-03-29 DIAGNOSIS — Z87891 Personal history of nicotine dependence: Secondary | ICD-10-CM | POA: Diagnosis not present

## 2020-03-29 DIAGNOSIS — I5022 Chronic systolic (congestive) heart failure: Secondary | ICD-10-CM | POA: Diagnosis not present

## 2020-03-29 DIAGNOSIS — K219 Gastro-esophageal reflux disease without esophagitis: Secondary | ICD-10-CM | POA: Insufficient documentation

## 2020-03-29 DIAGNOSIS — E119 Type 2 diabetes mellitus without complications: Secondary | ICD-10-CM | POA: Insufficient documentation

## 2020-03-29 MED ORDER — ROSUVASTATIN CALCIUM 10 MG PO TABS
10.0000 mg | ORAL_TABLET | Freq: Every day | ORAL | 3 refills | Status: DC
Start: 2020-03-29 — End: 2021-04-13

## 2020-03-29 NOTE — Progress Notes (Signed)
ADVANCED HF CLINIC NOTE  Referring Physician: Dr. Maylon Peppers PCP: Dr. [name] Primary Cardiologist: Dr. Acie Fredrickson  HPI:  [name] [name] is a 79 y.o. male with HTN, HL, DM2 and T-cell lymphoma followed by Dr. Acie Fredrickson. He is referred by Dr. Maylon Peppers to evaluate for abnormal echo.  He was diagnosed with stage IV angioimmunoblastic T-cell lymphoma in 12/20 (treated with doxorubicin, Cytoxan, Adcetris 09/29/2019). After first rr=ound of chemo was admitted 09/2019 with acute hypoxic respiratory failure due to PNA and possibly new heart failure.  Echocardiogram showed EF of 45 to 50% with mild apical anterior wall motion abnormality is felt secondary to chemotherapy.  He developed AKI with diuresis.   Cath 1/21 with minimal CAD (LAD 30%) and normal hemodynamics.  Subsequently chemo regimen was changed to Adcetris + CHP w/ Onpro. Finished chemo in 5/21 and now felt to be in remission.   Says he feels pretty good. No CP or SOB. 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 No edema or orthopnea or PND.   Echo 12/22/19: EF 60-65% Personally reviewed   Cath 1/21  LAD 30% lesion otherwise normal coronaries  Ao = 122/64 (89) LV =  133/7 RA =  1 RV = 17/2 PA =  16/2 (10) PCW = 7 Fick cardiac output/index = 9.9/4.2 PVR = < 1.0 WU FA sat = 98% PA sat = 75%, 77%  Assessment:  1. Mild coronary artery plaquing 2. LVEF 60-65% 3. Normal hemodynamics   Past Medical History:  Diagnosis Date  . Arthritis   . Complication of anesthesia     had spinal with knee surgeries  -"heart rate too low" for general  . Detached retina, left    L eye normal vision, reports that he has had a retina procedure in a doctor's office at [facility]   . Diabetes mellitus without complication ([place])   . Dysrhythmia    slight irregular rate  . GERD (gastroesophageal reflux disease)    hx no problems now  . H/O echocardiogram 2014  . History of hiatal hernia    ?20 yrs ago  . Hyperopia 2016  . Hypertension   .  Lumbar stenosis with neurogenic claudication   . MG, ocular (myasthenia gravis) ([place])    ? of L eye,   . Prediabetes   . Presbyopia OU 2016    Current Outpatient Medications  Medication Sig Dispense Refill  . allopurinol (ZYLOPRIM) 300 MG tablet TAKE 1 TABLET BY MOUTH EVERY DAY 90 tablet 1  . aspirin 81 MG chewable tablet Chew 81 mg by mouth daily.    . furosemide (LASIX) 40 MG tablet Take 1 tablet (40 mg total) by mouth daily as needed for fluid or edema. 30 tablet 11  . hydrochlorothiazide (HYDRODIURIL) 25 MG tablet Take 1 tablet (25 mg total) by mouth daily. 90 tablet 1  . losartan (COZAAR) 100 MG tablet Take 1 tablet (100 mg total) by mouth daily. 90 tablet 1  . metFORMIN (GLUCOPHAGE) 500 MG tablet Take 1 tablet (500 mg total) by mouth daily with breakfast. 90 tablet 1  . Omega-3 Fatty Acids (FISH OIL) 1000 MG CAPS Take 1,000 mg by mouth daily.    . potassium chloride (KLOR-CON M10) 10 MEQ tablet Take 1 tablet (10 mEq total) by mouth daily. 90 tablet 1   No current facility-administered medications for this encounter.    Allergies  Allergen Reactions  . Doxycycline Rash      Social History   Socioeconomic History  .  Marital status: Married    Spouse name: Not on file  . Number of children: 4  . Years of education: Not on file  . Highest education level: Not on file  Occupational History  . Occupation: Audiological scientist-- retired 2010  Tobacco Use  . Smoking status: Former Smoker    Packs/day: 0.50    Years: 2.00    Pack years: 1.00    Types: Cigarettes    Quit date: 06/09/1966    Years since quitting: 53.8  . Smokeless tobacco: Never Used  Substance and Sexual Activity  . Alcohol use: No    Alcohol/week: 0.0 standard drinks    Comment: none  . Drug use: No  . Sexual activity: Yes    Partners: Female  Other Topics Concern  . Not on file  Social History Narrative   married, lives w/ second wife, 2 living children, lost 2 kids     Has a small dog.     Social  Determinants of Health   Financial Resource Strain:   . Difficulty of Paying Living Expenses:   Food Insecurity:   . Worried About Charity fundraiser in the Last Year:   . Arboriculturist in the Last Year:   Transportation Needs:   . Film/video editor (Medical):   Marland Kitchen Lack of Transportation (Non-Medical):   Physical Activity:   . Days of Exercise per Week:   . Minutes of Exercise per Session:   Stress:   . Feeling of Stress :   Social Connections:   . Frequency of Communication with Friends and Family:   . Frequency of Social Gatherings with Friends and Family:   . Attends Religious Services:   . Active Member of Clubs or Organizations:   . Attends Archivist Meetings:   Marland Kitchen Marital Status:   Intimate Partner Violence:   . Fear of Current or Ex-Partner:   . Emotionally Abused:   Marland Kitchen Physically Abused:   . Sexually Abused:       Family History  Problem Relation Age of Onset  . Diabetes Brother   . Colon cancer Brother 110       dx in 2018  . Hypertension Sister   . Prostate cancer Neg Hx   . Stroke Neg Hx   . CAD Neg Hx     Vitals:   03/29/20 1143  BP: 124/78  Pulse: 77  SpO2: 98%  Weight: 125.6 kg (276 lb 12.8 oz)    PHYSICAL EXAM: General:  Well appearing. No respiratory difficulty HEENT: normal Neck: supple. no JVD. Carotids 2+ bilat; no bruits. No lymphadenopathy or thryomegaly appreciated. Cor: PMI nondisplaced. Regular rate & rhythm. No rubs, gallops or murmurs. Lungs: clear Abdomen: obese  soft, nontender, nondistended. No hepatosplenomegaly. No bruits or masses. Good bowel sounds. Extremities: no cyanosis, clubbing, rash, edema. Chronic venous stasis change Neuro: alert & oriented x 3, cranial nerves grossly intact. moves all 4 extremities w/o difficulty. Affect pleasant.  ECG:  NSR 74 1AVB (266) No ST-T wave abnormalities. Personally reviewed   ASSESSMENT & PLAN:  1. Transient LV dysfunction -  Echocardiogram 1/21 with LV function of  45 to 50% with new  mid apical anterior wall motion abnormality.  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  - Continue losartan   2.  Essential HTN -  Blood pressure well controlled. Continue current regimen.  3.  Stage IV T-cell lymphoma - Followed by oncology. Now in remission  4. HLD - restart statin. Will start Crestor 10mg    5. Morbid obesity - suggested possible sleep study but prefers to defer at this time - would benefit from weight loss  F/u 9 months with repeat echo   Glori Bickers, MD  12:32 PM

## 2020-03-29 NOTE — Patient Instructions (Signed)
No labs done today.   START Crestor 10mg (1 tablet) by mouth daily.  No other medication changes were made. Please continue all other medications as prescribed.   Your physician recommends that you schedule a follow-up appointment in: 1 year. Our office will contact you to schedule an appointment at a later date.   If you have any questions or concerns before your next appointment please send [place] a message through [place] or call our office at [phone].    TO LEAVE A MESSAGE FOR THE NURSE SELECT OPTION 2, PLEASE LEAVE A MESSAGE INCLUDING: . YOUR NAME . DATE OF BIRTH . CALL BACK NUMBER . REASON FOR CALL**this is important as we prioritize the call backs  [place] AS LONG AS YOU CALL BEFORE 4:00 PM   At the [facility], you and your health needs are our priority. As part of our continuing mission to provide you with exceptional heart care, we have created designated Provider Care Teams. These Care Teams include your primary Cardiologist (physician) and Advanced Practice Providers (APPs- Physician Assistants and Nurse Practitioners) who all work together to provide you with the care you need, when you need it.   You may see any of the following providers on your designated Care Team at your next follow up: Marland Kitchen Dr Glori Bickers . Dr Loralie Champagne . Darrick Grinder, NP . Lyda Jester, PA . Audry Riles, PharmD   Please be sure to bring in all your medications bottles to every appointment.

## 2020-04-27 ENCOUNTER — Other Ambulatory Visit: Payer: Self-pay | Admitting: Internal Medicine

## 2020-04-29 ENCOUNTER — Encounter: Payer: Self-pay | Admitting: Internal Medicine

## 2020-05-09 ENCOUNTER — Other Ambulatory Visit: Payer: Self-pay | Admitting: Internal Medicine

## 2020-05-16 ENCOUNTER — Other Ambulatory Visit: Payer: Self-pay | Admitting: Internal Medicine

## 2020-05-25 ENCOUNTER — Other Ambulatory Visit: Payer: Self-pay | Admitting: Internal Medicine

## 2020-05-28 ENCOUNTER — Other Ambulatory Visit: Payer: Self-pay

## 2020-05-28 ENCOUNTER — Inpatient Hospital Stay (HOSPITAL_BASED_OUTPATIENT_CLINIC_OR_DEPARTMENT_OTHER): Payer: HMO | Admitting: Hematology & Oncology

## 2020-05-28 ENCOUNTER — Encounter: Payer: Self-pay | Admitting: Hematology & Oncology

## 2020-05-28 ENCOUNTER — Inpatient Hospital Stay: Payer: HMO

## 2020-05-28 ENCOUNTER — Inpatient Hospital Stay: Payer: HMO | Attending: Hematology

## 2020-05-28 VITALS — BP 149/61 | HR 62 | Temp 98.0°F | Resp 16 | Ht 72.0 in | Wt 280.0 lb

## 2020-05-28 DIAGNOSIS — C8440 Peripheral T-cell lymphoma, not classified, unspecified site: Secondary | ICD-10-CM | POA: Insufficient documentation

## 2020-05-28 DIAGNOSIS — Z7984 Long term (current) use of oral hypoglycemic drugs: Secondary | ICD-10-CM | POA: Insufficient documentation

## 2020-05-28 DIAGNOSIS — E119 Type 2 diabetes mellitus without complications: Secondary | ICD-10-CM | POA: Insufficient documentation

## 2020-05-28 LAB — CBC WITH DIFFERENTIAL (CANCER CENTER ONLY)
Abs Immature Granulocytes: 0.07 10*3/uL (ref 0.00–0.07)
Basophils Absolute: 0.1 10*3/uL (ref 0.0–0.1)
Basophils Relative: 1 %
Eosinophils Absolute: 0.2 10*3/uL (ref 0.0–0.5)
Eosinophils Relative: 3 %
HCT: 37.2 % — ABNORMAL LOW (ref 39.0–52.0)
Hemoglobin: 12.6 g/dL — ABNORMAL LOW (ref 13.0–17.0)
Immature Granulocytes: 1 %
Lymphocytes Relative: 13 %
Lymphs Abs: 1 10*3/uL (ref 0.7–4.0)
MCH: 31.3 pg (ref 26.0–34.0)
MCHC: 33.9 g/dL (ref 30.0–36.0)
MCV: 92.5 fL (ref 80.0–100.0)
Monocytes Absolute: 0.8 10*3/uL (ref 0.1–1.0)
Monocytes Relative: 10 %
Neutro Abs: 5.8 10*3/uL (ref 1.7–7.7)
Neutrophils Relative %: 72 %
Platelet Count: 239 10*3/uL (ref 150–400)
RBC: 4.02 MIL/uL — ABNORMAL LOW (ref 4.22–5.81)
RDW: 13.3 % (ref 11.5–15.5)
WBC Count: 7.9 10*3/uL (ref 4.0–10.5)
nRBC: 0 % (ref 0.0–0.2)

## 2020-05-28 LAB — CMP (CANCER CENTER ONLY)
ALT: 12 U/L (ref 0–44)
AST: 14 U/L — ABNORMAL LOW (ref 15–41)
Albumin: 4.5 g/dL (ref 3.5–5.0)
Alkaline Phosphatase: 66 U/L (ref 38–126)
Anion gap: 10 (ref 5–15)
BUN: 16 mg/dL (ref 8–23)
CO2: 30 mmol/L (ref 22–32)
Calcium: 10.1 mg/dL (ref 8.9–10.3)
Chloride: 99 mmol/L (ref 98–111)
Creatinine: 1.17 mg/dL (ref 0.61–1.24)
GFR, Est AFR Am: >60 mL/min (ref >60)
GFR, Estimated: 60 mL/min — ABNORMAL LOW (ref >60)
Glucose, Bld: 138 mg/dL — ABNORMAL HIGH (ref 70–99)
Potassium: 4.2 mmol/L (ref 3.5–5.1)
Sodium: 139 mmol/L (ref 135–145)
Total Bilirubin: 0.6 mg/dL (ref 0.3–1.2)
Total Protein: 7.3 g/dL (ref 6.5–8.1)

## 2020-05-28 LAB — LACTATE DEHYDROGENASE: LDH: 166 U/L (ref 98–192)

## 2020-05-28 NOTE — Progress Notes (Signed)
Hematology and Oncology Follow Up Visit  [name] [name] [id number] Jul 08, 1941 78 y.o. 05/28/2020   Principle Diagnosis:   Stage IV angioimmunoblastic T-cell NHL  Current Therapy:    S/p CHP + Adcetris x 6 cycles -- completed on 01/19/2020     Interim History:  [name] [name] is back for follow-up.  He was followed by Dr. Maylon Peppers.  [name] [name] has a T-cell non-Hodgkin's lymphoma.  This was an angioimmunoblastic lymphoma.  He was treated with chemotherapy along with Adcetris.  He got into remission.  He is still doing quite well.  He is a lot of fun to talk to.  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  It was amazing talking to him about who he played with.  There has been no problems otherwise.  He does have a hip issues.  He is going as he is orthopedic surgeon about this.  He has had no fever.  He has had no problems with the coronavirus.  There is been no problems with nausea or vomiting.  He has had no change in bowel or bladder habits.  He is diabetic.  He is trying to watch his blood sugars closely.  Overall, I was his performance status is ECOG 1.  Medications:  Current Outpatient Medications:  .  allopurinol (ZYLOPRIM) 300 MG tablet, TAKE 1 TABLET BY MOUTH EVERY DAY, Disp: 90 tablet, Rfl: 1 .  aspirin 81 MG chewable tablet, Chew 81 mg by mouth daily., Disp: , Rfl:  .  furosemide (LASIX) 40 MG tablet, Take 1 tablet (40 mg total) by mouth daily as needed for fluid or edema., Disp: 30 tablet, Rfl: 11 .  hydrochlorothiazide (HYDRODIURIL) 25 MG tablet, Take 1 tablet (25 mg total) by mouth daily., Disp: 30 tablet, Rfl: 0 .  losartan (COZAAR) 100 MG tablet, Take 1 tablet (100 mg total) by mouth daily., Disp: 15 tablet, Rfl: 0 .  metFORMIN (GLUCOPHAGE) 500 MG tablet, Take 1 tablet (500 mg total) by mouth daily with breakfast., Disp: 30 tablet, Rfl: 0 .  Omega-3 Fatty Acids (FISH OIL) 1000 MG CAPS, Take 1,000 mg by mouth daily., Disp: , Rfl:  .  potassium  chloride (KLOR-CON M10) 10 MEQ tablet, Take 1 tablet (10 mEq total) by mouth daily., Disp: 30 tablet, Rfl: 0 .  rosuvastatin (CRESTOR) 10 MG tablet, Take 1 tablet (10 mg total) by mouth daily., Disp: 90 tablet, Rfl: 3  Allergies:  Allergies  Allergen Reactions  . Doxycycline Rash    Past Medical History, Surgical history, Social history, and Family History were reviewed and updated.  Review of Systems: Review of Systems  Constitutional: Negative.   HENT:  Negative.   Eyes: Negative.   Respiratory: Positive for shortness of breath.   Cardiovascular: Positive for leg swelling.  Gastrointestinal: Positive for nausea.  Endocrine: Negative.   Genitourinary: Negative.    Musculoskeletal: Positive for arthralgias.  Skin: Negative.   Neurological: Negative.   Hematological: Negative.   Psychiatric/Behavioral: Negative.     Physical Exam:  height is 6' (1.829 m) and weight is 280 lb (127 kg). His oral temperature is 98 F (36.7 C). His blood pressure is 149/61 (abnormal) and his pulse is 62. His respiration is 16 and oxygen saturation is 100%.   Wt Readings from Last 3 Encounters:  05/28/20 280 lb (127 kg)  03/29/20 276 lb 12.8 oz (125.6 kg)  03/04/20 254 lb (115.2 kg)    Physical Exam Vitals reviewed.  HENT:  Head: Normocephalic and atraumatic.  Eyes:     Pupils: Pupils are equal, round, and reactive to light.  Cardiovascular:     Rate and Rhythm: Normal rate and regular rhythm.     Heart sounds: Normal heart sounds.  Pulmonary:     Effort: Pulmonary effort is normal.     Breath sounds: Normal breath sounds.  Abdominal:     General: Bowel sounds are normal.     Palpations: Abdomen is soft.  Musculoskeletal:        General: No tenderness or deformity. Normal range of motion.     Cervical back: Normal range of motion.  Lymphadenopathy:     Cervical: No cervical adenopathy.  Skin:    General: Skin is warm and dry.     Findings: No erythema or rash.  Neurological:       Mental Status: He is alert and oriented to person, place, and time.  Psychiatric:        Behavior: Behavior normal.        Thought Content: Thought content normal.        Judgment: Judgment normal.      Lab Results  Component Value Date   WBC 7.9 05/28/2020   HGB 12.6 (L) 05/28/2020   HCT 37.2 (L) 05/28/2020   MCV 92.5 05/28/2020   PLT 239 05/28/2020     Chemistry      Component Value Date/Time   NA 139 05/28/2020 1000   K 4.2 05/28/2020 1000   CL 99 05/28/2020 1000   CO2 30 05/28/2020 1000   BUN 16 05/28/2020 1000   CREATININE 1.17 05/28/2020 1000   CREATININE 1.41 (H) 06/06/2019 1528      Component Value Date/Time   CALCIUM 10.1 05/28/2020 1000   ALKPHOS 66 05/28/2020 1000   AST 14 (L) 05/28/2020 1000   ALT 12 05/28/2020 1000   BILITOT 0.6 05/28/2020 1000      Impression and Plan: [name] [name] is a very nice 79 year old white male.  He had stage IV T-cell lymphoma.  This was an angioimmunoblastic lymphoma.  He was treated with chemotherapy and targeted therapy.  He went into remission.  He is still in remission.  I do not see where have to do any scans on him right now.  His lab work looks fine.  We will plan to get back in about 3 months or so.  I think this would be very reasonable.   [name], MD 8/6/202111:08 AM

## 2020-05-31 ENCOUNTER — Encounter: Payer: Self-pay | Admitting: *Deleted

## 2020-05-31 NOTE — Progress Notes (Signed)
Oncology Nurse Navigator Documentation  Oncology Nurse Navigator Flowsheets 05/31/2020  Abnormal Finding Date -  Confirmed Diagnosis Date -  Diagnosis Status -  Planned Course of Treatment -  Phase of Treatment -  Chemotherapy Actual Start Date: -  Navigator Follow Up Date: -  Navigator Follow Up Reason: -  Navigation Complete Date: -  Post Navigation: Continue to Follow Patient? -  Reason Not Navigating Patient: -  Navigator Location [facility]  Referral Date to RadOnc/MedOnc -  Navigator Encounter Type Appt/Treatment Plan Review;Other:  Patient Visit Type -  Treatment Phase -  Barriers/Navigation Needs -  Education -  Interventions -  Acuity -  Referrals -  Coordination of Care -  Education Method -  Time Spent with Patient 15

## 2020-06-03 ENCOUNTER — Ambulatory Visit (INDEPENDENT_AMBULATORY_CARE_PROVIDER_SITE_OTHER): Payer: HMO | Admitting: Internal Medicine

## 2020-06-03 ENCOUNTER — Other Ambulatory Visit: Payer: Self-pay

## 2020-06-03 ENCOUNTER — Encounter: Payer: Self-pay | Admitting: Internal Medicine

## 2020-06-03 VITALS — BP 131/85 | HR 66 | Temp 98.2°F | Resp 12 | Ht 72.0 in | Wt 277.8 lb

## 2020-06-03 DIAGNOSIS — E785 Hyperlipidemia, unspecified: Secondary | ICD-10-CM

## 2020-06-03 DIAGNOSIS — E1169 Type 2 diabetes mellitus with other specified complication: Secondary | ICD-10-CM

## 2020-06-03 LAB — LDL CHOLESTEROL, DIRECT: Direct LDL: 72 mg/dL

## 2020-06-03 LAB — LIPID PANEL
Cholesterol: 146 mg/dL (ref 0–200)
HDL: 39 mg/dL — ABNORMAL LOW (ref >39.00)
NonHDL: 106.8
Total CHOL/HDL Ratio: 4
Triglycerides: 257 mg/dL — ABNORMAL HIGH (ref 0.0–149.0)
VLDL: 51.4 mg/dL — ABNORMAL HIGH (ref 0.0–40.0)

## 2020-06-03 LAB — HEMOGLOBIN A1C: Hgb A1c MFr Bld: 6.1 % (ref 4.6–6.5)

## 2020-06-03 NOTE — Progress Notes (Signed)
Subjective:    Patient ID: [name] [name], male    DOB: 1941/07/26, 79 y.o.   MRN: [id number]  DOS:  06/03/2020 Type of visit - description: Follow-up Today I reviewed the notes from oncology and the last cardiac catheterization. He is doing well. Did have to place his wife in a nursing home.  He is very sad about it.    Review of Systems Denies chest pain or difficulty breathing. No edema or palpitations Still has some hip pain when he is active at home  Past Medical History:  Diagnosis Date   Arthritis    Complication of anesthesia     had spinal with knee surgeries  -"heart rate too low" for general   Detached retina, left    L eye normal vision, reports that he has had a retina procedure in a doctor's office at [facility]    Diabetes mellitus without complication ([place])    Dysrhythmia    slight irregular rate   GERD (gastroesophageal reflux disease)    hx no problems now   H/O echocardiogram 2014   History of hiatal hernia    ?20 yrs ago   Hyperopia 2016   Hypertension    Lumbar stenosis with neurogenic claudication    MG, ocular (myasthenia gravis) ([place])    ? of L eye,    Prediabetes    Presbyopia OU 2016    Past Surgical History:  Procedure Laterality Date   COLONOSCOPY     HERNIA REPAIR     bilateral inguinal hernia   HERNIA REPAIR     umbilical   IR IMAGING GUIDED PORT INSERTION  09/16/2019   IR REMOVAL TUN ACCESS W/ PORT W/O FL MOD SED  03/04/2020   JOINT REPLACEMENT  2009   right knee   LUMBAR LAMINECTOMY/DECOMPRESSION MICRODISCECTOMY N/A 06/06/2017   Procedure: Decompression L3-5, insitu fusion L3-5 ;  Surgeon: Melina Schools, MD;  Location: [place];  Service: Orthopedics;  Laterality: N/A;   LYMPH NODE BIOPSY Right 09/01/2019   Procedure: CERVICAL LYMPH NODE BIOPSY;  Surgeon: [name], MD;  Location: [place] [facility];  Service: ENT;  Laterality: Right;   MIDDLE EAR SURGERY Right    ear -patched hole in ear drum   RIGHT/LEFT HEART CATH AND  CORONARY ANGIOGRAPHY N/A 11/11/2019   Procedure: RIGHT/LEFT HEART CATH AND CORONARY ANGIOGRAPHY;  Surgeon: Jolaine Artist, MD;  Location: [facility];  Service: Cardiovascular;  Laterality: N/A;   TOTAL KNEE ARTHROPLASTY  09/10/2012   Procedure: TOTAL KNEE ARTHROPLASTY;  Surgeon: Tobi Bastos, MD;  Location: [facility];  Service: Orthopedics;  Laterality: Left;   TOTAL SHOULDER ARTHROPLASTY Right 06/22/2016   Procedure: RIGHT TOTAL SHOULDER ARTHROPLASTY;  Surgeon: [name], MD;  Location: [place];  Service: Orthopedics;  Laterality: Right;    Allergies as of 06/03/2020      Reactions   Doxycycline Rash      Medication List       Accurate as of June 03, 2020  5:58 PM. If you have any questions, ask your nurse or doctor.        allopurinol 300 MG tablet Commonly known as: ZYLOPRIM TAKE 1 TABLET BY MOUTH EVERY DAY   aspirin 81 MG chewable tablet Chew 81 mg by mouth daily.   Fish Oil 1000 MG Caps Take 1,000 mg by mouth daily.   furosemide 40 MG tablet Commonly known as: Lasix Take 1 tablet (40 mg total) by mouth daily as needed for fluid or edema.  hydrochlorothiazide 25 MG tablet Commonly known as: HYDRODIURIL Take 1 tablet (25 mg total) by mouth daily.   losartan 100 MG tablet Commonly known as: COZAAR Take 1 tablet (100 mg total) by mouth daily.   metFORMIN 500 MG tablet Commonly known as: GLUCOPHAGE Take 1 tablet (500 mg total) by mouth daily with breakfast.   potassium chloride 10 MEQ tablet Commonly known as: [place]-Con M10 Take 1 tablet (10 mEq total) by mouth daily.   rosuvastatin 10 MG tablet Commonly known as: Crestor Take 1 tablet (10 mg total) by mouth daily.          Objective:   Physical Exam BP 131/85 (BP Location: Left Arm, Patient Position: Sitting, Cuff Size: Large)    Pulse 66    Temp 98.2 F (36.8 C) (Oral)    Resp 12    Ht 6' (1.829 m)    Wt 277 lb 12.8 oz (126 kg)    BMI 37.68 kg/m  General:   Well developed, NAD, BMI  noted. HEENT:  Normocephalic . Face symmetric, atraumatic Lungs:  CTA B Normal respiratory effort, no intercostal retractions, no accessory muscle use. Heart: RRR,  no murmur.  Lower extremities: Trace pretibial edema bilaterally  Skin: Not pale. Not jaundice Neurologic:  alert & oriented X3.  Speech normal, gait somewhat limited, assisted by a cane Psych--  Cognition and judgment appear intact.  Cooperative with normal attention span and concentration.  Behavior appropriate. No anxious or depressed appearing.      Assessment     Assessment DM HTN Hyperlipidemia Morbid obesity Snoring  CV: -Palpitations, PVCs, h/o bradycardia,s/p. Dr Adin Hector day monitor, echo 2014: Normal LV, some LVH due to HTN - new onset acute syst CHF 10/2019>>  S/p catheterization 11/11/2019: No CAD: Normal EF. Myasthenia gravis (ocular, Dr Tomi Likens)  h/o detached retina before  Stasis dermatitis T-cell lymphoma, angioimmunoblastic, DX 08-2019   PLAN: DM: On Metformin, check A1c HTN: Seems well controlled on Lasix, HCTZ, losartan, potassium supplements, last BMP satisfactory. Hyperlipidemia: Continue Crestor, check FLP H/o acute systolic CHF: Had a cardiac catheterization 11/11/2019: No CAD, normal EF.  Currently seems to be doing well. T-cell lymphoma: Saw hematology recently, next visit in 3 months, on remission.  Taking allopurinol. Recommend flu shot this fall RTC 6 months  This visit occurred during the SARS-CoV-2 public health emergency.  Safety protocols were in place, including screening questions prior to the visit, additional usage of staff PPE, and extensive cleaning of exam room while observing appropriate contact time as indicated for disinfecting solutions.

## 2020-06-03 NOTE — Assessment & Plan Note (Signed)
DM: On Metformin, check A1c HTN: Seems well controlled on Lasix, HCTZ, losartan, potassium supplements, last BMP satisfactory. Hyperlipidemia: Continue Crestor, check FLP H/o acute systolic CHF: Had a cardiac catheterization 11/11/2019: No CAD, normal EF.  Currently seems to be doing well. T-cell lymphoma: Saw hematology recently, next visit in 3 months, on remission.  Taking allopurinol. Recommend flu shot this fall RTC 6 months

## 2020-06-03 NOTE — Patient Instructions (Addendum)
Get your flu shot this fall   GO TO THE LAB : Get the blood work     [place], [place] back for a physical exam in 6 months

## 2020-06-05 ENCOUNTER — Other Ambulatory Visit: Payer: Self-pay | Admitting: Internal Medicine

## 2020-06-07 ENCOUNTER — Other Ambulatory Visit: Payer: Self-pay | Admitting: Internal Medicine

## 2020-06-10 ENCOUNTER — Other Ambulatory Visit: Payer: Self-pay

## 2020-06-10 NOTE — Patient Outreach (Signed)
  [facility]) Care Management Chronic Special Needs Program  06/10/2020  Name: [name] [name] DOB: 05/30/1941  MRN: [id number]  [name] is enrolled in a chronic special needs plan for Diabetes. RNCM called to follow up, review and update care plan.  Subjective: Client reports he is doing well. He states his cancer is in remission. Client reports contnues to follow up with cardiologist. Client states he is doing well. Per Cardiologist visit on 03/29/20, EF 60-65%. Last visit with primary care provider on 06/03/20. hemoglobin A1C 6.1 (done 06/03/20). Client reports he does not check his blood sugar and does not have a meter. Client without any questions or concerns at this time.   Goals Addressed            This Visit's Progress   . Client verbalize knowledge of Heart Failure disease self management actions within the next 6 months.   On track    Discussed heart failure signs/symptoms of worsening.  Continue to take medications as prescribed and follow up with provider as scheduled.    . Client will verbalize knowledge of self management of Hypertension as evidences by BP reading of 140/90 or less; or as defined by provider   On track     131/85 on 06/03/20 Discussed blood pressure monitoring at home. Continue to take medications as prescribed. Continue to follow up with your provider visits as scheduled. Ask your doctor "what is my target blood pressure range".  Monitor your blood pressure and take results to your doctor's appointment.      . COMPLETED: Obtain annual  Lipid Profile, LDL-C       Done 06/03/2020    . Obtain Annual Eye (retinal)  Exam    On track    Reports needs an eye exam. Warm transfer to health care concierge to assist with in network provider.  It is important to attend provider visits as scheduled for annual exam.    . Obtain annual screen for micro albuminuria (urine) , nephropathy (kidney problems)   On track    Reports has not been  done in a while  It is important to attend scheduled provider visits to obtain recommended labs/procedures. This is a test that looks at how your kidneys are working.    . Patient Stated: will obtain glucose meter within the next 6 months.   On track    Discussed importance of monitoring blood sugar. Discussed signs/symptoms of hypoglycemia RNCM instructed client to request a prescription from primary care provider. Warm transfer to health care concierge regarding meter benefit.      Plan: Warm transfer to health care conceirge regarding over the counter card; in network eye doctor; and glucose meter benefits. Send updated care plan to primary care provider; send updated care plan to client; Outreach to client wthin 6 months or sooner as indicated.   Thea Silversmith, RN, MSN, [place] [phone]

## 2020-06-15 ENCOUNTER — Ambulatory Visit: Payer: Self-pay

## 2020-08-05 ENCOUNTER — Telehealth: Payer: Self-pay | Admitting: Internal Medicine

## 2020-08-05 NOTE — Progress Notes (Signed)
  Chronic Care Management   Note  08/05/2020 Name: [name] [name] MRN: [id number] DOB: 1941/01/14  [name] [name] is a 79 y.o. year old male who is a primary care patient of Colon Branch, MD. I reached out to Madaline Savage by phone today in response to a referral sent by [name] [name] PCP, Colon Branch, MD.   [name] [name] was given information about Chronic Care Management services today including:  1. CCM service includes personalized support from designated clinical staff supervised by his physician, including individualized plan of care and coordination with other care providers 2. 24/7 contact phone numbers for assistance for urgent and routine care needs. 3. Service will only be billed when office clinical staff spend 20 minutes or more in a month to coordinate care. 4. Only one practitioner may furnish and bill the service in a calendar month. 5. The patient may stop CCM services at any time (effective at the end of the month) by phone call to the office staff.   Patient agreed to services and verbal consent obtained.   Follow up plan:   [name] [name] [employer] Scheduler

## 2020-08-10 ENCOUNTER — Other Ambulatory Visit: Payer: Self-pay | Admitting: *Deleted

## 2020-08-10 DIAGNOSIS — C8440 Peripheral T-cell lymphoma, not classified, unspecified site: Secondary | ICD-10-CM

## 2020-08-10 MED ORDER — ALLOPURINOL 300 MG PO TABS: 300.0000 mg | ORAL_TABLET | Freq: Every day | ORAL | 1 refills | Status: DC

## 2020-08-24 ENCOUNTER — Other Ambulatory Visit: Payer: Self-pay | Admitting: Internal Medicine

## 2020-08-24 NOTE — Telephone Encounter (Signed)
Advise patient: I sent a prescription. In the past he used for a infection between the toes, if he has a different rash or if he is not  getting better let me know.

## 2020-08-24 NOTE — Telephone Encounter (Signed)
Refill request for ketoconazole 2% cream. No longer on med list. Please advise.

## 2020-08-24 NOTE — Telephone Encounter (Signed)
LMOM informing below.  

## 2020-09-03 ENCOUNTER — Encounter: Payer: Self-pay | Admitting: Internal Medicine

## 2020-09-09 ENCOUNTER — Telehealth: Payer: Self-pay

## 2020-09-09 ENCOUNTER — Other Ambulatory Visit: Payer: Self-pay

## 2020-09-09 ENCOUNTER — Encounter: Payer: Self-pay | Admitting: Hematology & Oncology

## 2020-09-09 ENCOUNTER — Inpatient Hospital Stay: Payer: HMO | Attending: Hematology & Oncology

## 2020-09-09 ENCOUNTER — Inpatient Hospital Stay (HOSPITAL_BASED_OUTPATIENT_CLINIC_OR_DEPARTMENT_OTHER): Payer: HMO | Admitting: Hematology & Oncology

## 2020-09-09 VITALS — BP 120/76 | HR 76 | Temp 99.4°F | Resp 18 | Wt 288.0 lb

## 2020-09-09 DIAGNOSIS — C8440 Peripheral T-cell lymphoma, not classified, unspecified site: Secondary | ICD-10-CM | POA: Diagnosis not present

## 2020-09-09 DIAGNOSIS — E113299 Type 2 diabetes mellitus with mild nonproliferative diabetic retinopathy without macular edema, unspecified eye: Secondary | ICD-10-CM | POA: Diagnosis not present

## 2020-09-09 LAB — CMP (CANCER CENTER ONLY)
ALT: 16 U/L (ref 0–44)
AST: 15 U/L (ref 15–41)
Albumin: 4.5 g/dL (ref 3.5–5.0)
Alkaline Phosphatase: 73 U/L (ref 38–126)
Anion gap: 11 (ref 5–15)
BUN: 24 mg/dL — ABNORMAL HIGH (ref 8–23)
CO2: 31 mmol/L (ref 22–32)
Calcium: 10.2 mg/dL (ref 8.9–10.3)
Chloride: 98 mmol/L (ref 98–111)
Creatinine: 1.26 mg/dL — ABNORMAL HIGH (ref 0.61–1.24)
GFR, Estimated: 58 mL/min — ABNORMAL LOW (ref >60)
Glucose, Bld: 118 mg/dL — ABNORMAL HIGH (ref 70–99)
Potassium: 4.4 mmol/L (ref 3.5–5.1)
Sodium: 140 mmol/L (ref 135–145)
Total Bilirubin: 0.5 mg/dL (ref 0.3–1.2)
Total Protein: 7.4 g/dL (ref 6.5–8.1)

## 2020-09-09 LAB — LACTATE DEHYDROGENASE: LDH: 133 U/L (ref 98–192)

## 2020-09-09 LAB — CBC WITH DIFFERENTIAL (CANCER CENTER ONLY)
Abs Immature Granulocytes: 0.06 10*3/uL (ref 0.00–0.07)
Basophils Absolute: 0.1 10*3/uL (ref 0.0–0.1)
Basophils Relative: 1 %
Eosinophils Absolute: 0.2 10*3/uL (ref 0.0–0.5)
Eosinophils Relative: 2 %
HCT: 37.6 % — ABNORMAL LOW (ref 39.0–52.0)
Hemoglobin: 13 g/dL (ref 13.0–17.0)
Immature Granulocytes: 1 %
Lymphocytes Relative: 15 %
Lymphs Abs: 1.3 10*3/uL (ref 0.7–4.0)
MCH: 31.8 pg (ref 26.0–34.0)
MCHC: 34.6 g/dL (ref 30.0–36.0)
MCV: 91.9 fL (ref 80.0–100.0)
Monocytes Absolute: 1 10*3/uL (ref 0.1–1.0)
Monocytes Relative: 11 %
Neutro Abs: 5.9 10*3/uL (ref 1.7–7.7)
Neutrophils Relative %: 70 %
Platelet Count: 274 10*3/uL (ref 150–400)
RBC: 4.09 MIL/uL — ABNORMAL LOW (ref 4.22–5.81)
RDW: 13.2 % (ref 11.5–15.5)
WBC Count: 8.4 10*3/uL (ref 4.0–10.5)
nRBC: 0 % (ref 0.0–0.2)

## 2020-09-09 LAB — SAVE SMEAR(SSMR), FOR PROVIDER SLIDE REVIEW

## 2020-09-09 NOTE — Patient Outreach (Signed)
  [facility]) Care Management Chronic Special Needs Program    09/09/2020  Name: [name] [name], DOB: June 04, 1941  MRN: [id number]   Mr. [name] is enrolled in a chronic special needs plan for Heart Failure. RNCM called to follow up regarding client obtaining glucose meter. No answer. HIPAA compliant message left. [facility] will continue to provide services for this member through 10/22/2020. The [employer] Care Management Team will assume care 10/23/2020.   Thea Silversmith, RN, MSN, [place] [phone]

## 2020-09-09 NOTE — Progress Notes (Signed)
Hematology and Oncology Follow Up Visit  [name] [name] [id number] 1941-03-05 79 y.o. 09/09/2020   Principle Diagnosis:   Stage IV angioimmunoblastic T-cell NHL  Current Therapy:    S/p CHP + Adcetris x 6 cycles -- completed on 01/19/2020     Interim History:  [name] is back for follow-up.  Overall, he is doing okay.  Unfortunately, he had to put his wife into a nursing home because she got sick.  I do not know she has any problems with dementia.  I just feel bad that he had to put her in the nursing home.  It will be a quiet Thanksgiving for him.  He has had some problems with his medications.  He brought in his medications and we went through them.  I told him which ones he did not have to take.  He has had no fever.  He has had no problems with bowels or bladder.  He has had no rashes.  There is been some leg swelling but this is more chronic.  He has had no cough or shortness of breath.  There is been no headaches.  He is trying to lose a little bit of weight.  He does have diabetes.    Overall, I was his performance status is ECOG 1.  Medications:  Current Outpatient Medications:  .  allopurinol (ZYLOPRIM) 300 MG tablet, Take 1 tablet (300 mg total) by mouth daily., Disp: 90 tablet, Rfl: 1 .  aspirin 81 MG chewable tablet, Chew 81 mg by mouth daily., Disp: , Rfl:  .  furosemide (LASIX) 40 MG tablet, Take 1 tablet (40 mg total) by mouth daily as needed for fluid or edema., Disp: 30 tablet, Rfl: 11 .  hydrochlorothiazide (HYDRODIURIL) 25 MG tablet, Take 1 tablet (25 mg total) by mouth daily., Disp: 90 tablet, Rfl: 1 .  ketoconazole (NIZORAL) 2 % cream, APPLY TO AFFECTED AREA EVERY DAY, Disp: 30 g, Rfl: 0 .  losartan (COZAAR) 100 MG tablet, Take 1 tablet (100 mg total) by mouth daily., Disp: 90 tablet, Rfl: 2 .  metFORMIN (GLUCOPHAGE) 500 MG tablet, Take 1 tablet (500 mg total) by mouth daily with breakfast., Disp: 30 tablet, Rfl: 0 .  Omega-3 Fatty Acids (FISH OIL) 1000  MG CAPS, Take 1,000 mg by mouth daily., Disp: , Rfl:  .  potassium chloride (KLOR-CON M10) 10 MEQ tablet, Take 1 tablet (10 mEq total) by mouth daily., Disp: 90 tablet, Rfl: 1 .  rosuvastatin (CRESTOR) 10 MG tablet, Take 1 tablet (10 mg total) by mouth daily., Disp: 90 tablet, Rfl: 3  Allergies:  Allergies  Allergen Reactions  . Doxycycline Rash    Past Medical History, Surgical history, Social history, and Family History were reviewed and updated.  Review of Systems: Review of Systems  Constitutional: Negative.   HENT:  Negative.   Eyes: Negative.   Respiratory: Positive for shortness of breath.   Cardiovascular: Positive for leg swelling.  Gastrointestinal: Positive for nausea.  Endocrine: Negative.   Genitourinary: Negative.    Musculoskeletal: Positive for arthralgias.  Skin: Negative.   Neurological: Negative.   Hematological: Negative.   Psychiatric/Behavioral: Negative.     Physical Exam:  weight is 288 lb (130.6 kg). His oral temperature is 99.4 F (37.4 C). His blood pressure is 120/76 and his pulse is 76. His respiration is 18 and oxygen saturation is 99%.   Wt Readings from Last 3 Encounters:  09/09/20 288 lb (130.6 kg)  06/03/20 277 lb 12.8 oz (126  kg)  05/28/20 280 lb (127 kg)    Physical Exam Vitals reviewed.  HENT:     Head: Normocephalic and atraumatic.  Eyes:     Pupils: Pupils are equal, round, and reactive to light.  Cardiovascular:     Rate and Rhythm: Normal rate and regular rhythm.     Heart sounds: Normal heart sounds.  Pulmonary:     Effort: Pulmonary effort is normal.     Breath sounds: Normal breath sounds.  Abdominal:     General: Bowel sounds are normal.     Palpations: Abdomen is soft.  Musculoskeletal:        General: No tenderness or deformity. Normal range of motion.     Cervical back: Normal range of motion.  Lymphadenopathy:     Cervical: No cervical adenopathy.  Skin:    General: Skin is warm and dry.     Findings: No  erythema or rash.  Neurological:     Mental Status: He is alert and oriented to person, place, and time.  Psychiatric:        Behavior: Behavior normal.        Thought Content: Thought content normal.        Judgment: Judgment normal.      Lab Results  Component Value Date   WBC 8.4 09/09/2020   HGB 13.0 09/09/2020   HCT 37.6 (L) 09/09/2020   MCV 91.9 09/09/2020   PLT 274 09/09/2020     Chemistry      Component Value Date/Time   NA 140 09/09/2020 1317   K 4.4 09/09/2020 1317   CL 98 09/09/2020 1317   CO2 31 09/09/2020 1317   BUN 24 (H) 09/09/2020 1317   CREATININE 1.26 (H) 09/09/2020 1317   CREATININE 1.41 (H) 06/06/2019 1528      Component Value Date/Time   CALCIUM 10.2 09/09/2020 1317   ALKPHOS 73 09/09/2020 1317   AST 15 09/09/2020 1317   ALT 16 09/09/2020 1317   BILITOT 0.5 09/09/2020 1317      Impression and Plan: [name] is a very nice 79 year old white male.  He had stage IV T-cell lymphoma.  This was an angioimmunoblastic lymphoma.  He was treated with chemotherapy and targeted therapy.  He went into remission.  He is still in remission.  I do not see where have to do any scans on him right now.  His lab work looks fine.  We will plan to get back in about 3 months or so.  I think this would be very reasonable.   [name], MD 11/18/20212:40 PM

## 2020-09-09 NOTE — Telephone Encounter (Signed)
appts made and printed for pt per 09/09/20 los   AOM

## 2020-09-29 ENCOUNTER — Other Ambulatory Visit: Payer: Self-pay | Admitting: Internal Medicine

## 2020-09-29 NOTE — Telephone Encounter (Signed)
Agree, it is not on the medication list. Please call the patient, if he has been taking carvedilol all along, okay to refill.  No refill otherwise.

## 2020-09-29 NOTE — Telephone Encounter (Signed)
Received refill request for carvediolol 12.5mg  bid, no longer on med list. Please advise.

## 2020-09-29 NOTE — Telephone Encounter (Signed)
Spoke w/ Pt- he has not been taking the carvedilol. Rx denied.

## 2020-10-01 ENCOUNTER — Other Ambulatory Visit: Payer: Self-pay

## 2020-10-01 DIAGNOSIS — E1159 Type 2 diabetes mellitus with other circulatory complications: Secondary | ICD-10-CM

## 2020-10-01 DIAGNOSIS — E1169 Type 2 diabetes mellitus with other specified complication: Secondary | ICD-10-CM

## 2020-10-01 DIAGNOSIS — E785 Hyperlipidemia, unspecified: Secondary | ICD-10-CM

## 2020-10-01 NOTE — Addendum Note (Signed)
Addended byDamita Dunnings D on: 10/01/2020 08:56 AM   Modules accepted: Orders

## 2020-10-12 ENCOUNTER — Ambulatory Visit: Payer: HMO | Admitting: Pharmacist

## 2020-10-12 DIAGNOSIS — E1169 Type 2 diabetes mellitus with other specified complication: Secondary | ICD-10-CM

## 2020-10-12 DIAGNOSIS — I152 Hypertension secondary to endocrine disorders: Secondary | ICD-10-CM

## 2020-10-12 NOTE — Chronic Care Management (AMB) (Signed)
Chronic Care Management Pharmacy  Name: [name] [name]  MRN: [id number] DOB: 05/08/1941  Chief Complaint/ HPI  Madaline Savage,  79 y.o. , male presents for their Initial CCM visit with the clinical pharmacist via telephone (was scheduled for in patient, but pt did not arrive in clinic).  PCP : Colon Branch, MD  Their chronic conditions include: Hypertension, Hyperlipidemia, Diabetes, LV Dysfunction/Heart Failure?, Gout  Office Visits: 06/03/20: Visit w/ Dr. [name] - No med changes noted. RTC 6 months.   Consult Visit: 09/09/20: Oncology visit w/ Dr. Marin Olp - Stage IV T-cell lymphoma in remission. No med changes noted. RTC 3 months.   Medications: Outpatient Encounter Medications as of 10/12/2020  Medication Sig Note  . allopurinol (ZYLOPRIM) 300 MG tablet Take 1 tablet (300 mg total) by mouth daily.   Marland Kitchen aspirin 81 MG chewable tablet Chew 81 mg by mouth daily.   . furosemide (LASIX) 40 MG tablet Take 1 tablet (40 mg total) by mouth daily as needed for fluid or edema.   . hydrochlorothiazide (HYDRODIURIL) 25 MG tablet Take 1 tablet (25 mg total) by mouth daily. 10/12/2020: Reports taking about 3 times per week  . losartan (COZAAR) 100 MG tablet Take 1 tablet (100 mg total) by mouth daily.   . metFORMIN (GLUCOPHAGE) 500 MG tablet Take 1 tablet (500 mg total) by mouth daily with breakfast.   . Omega-3 Fatty Acids (FISH OIL) 1000 MG CAPS Take 1,000 mg by mouth daily.   . potassium chloride (KLOR-CON M10) 10 MEQ tablet Take 1 tablet (10 mEq total) by mouth daily.   . rosuvastatin (CRESTOR) 10 MG tablet Take 1 tablet (10 mg total) by mouth daily.   Marland Kitchen ketoconazole (NIZORAL) 2 % cream APPLY TO AFFECTED AREA EVERY Iasia Forcier    No facility-administered encounter medications on file as of 10/12/2020.   SDOH Screenings   Alcohol Screen: Not on file  Depression (PHQ2-9): Medium Risk  . PHQ-2 Score: 5  Financial Resource Strain: Low Risk   . Difficulty of Paying Living Expenses: Not very hard  Food  Insecurity: Not on file  Housing: Not on file  Physical Activity: Not on file  Social Connections: Not on file  Stress: Not on file  Tobacco Use: Medium Risk  . Smoking Tobacco Use: Former Smoker  . Smokeless Tobacco Use: Never Used  Transportation Needs: Not on file     Current Diagnosis/Assessment:  Goals Addressed            This Visit's Progress   . Chronic Care Management Pharmacy Care Plan       CARE PLAN ENTRY (see longitudinal plan of care for additional care plan information)  Current Barriers:  . Chronic Disease Management support, education, and care coordination needs related to Hypertension, Hyperlipidemia, Diabetes, LV Dysfunction/Heart Failure?, Gout   Hypertension BP Readings from Last 3 Encounters:  09/09/20 120/76  06/03/20 131/85  05/28/20 (!) 149/61   . Pharmacist Clinical Goal(s): o Over the next 180 days, patient will work with PharmD and providers to maintain BP goal <140/90 . Current regimen:  . HCTZ 37m daily  . Losartan 1077mdaily . Interventions: o Discussed BP goal . Patient self care activities - Over the next 180 days, patient will: o Maintain hypertension medication regimen.   Hyperlipidemia Lab Results  Component Value Date/Time   LDLCALC 39 10/14/2019 05:26 AM   LDLDIRECT 72.0 06/03/2020 10:15 AM   . Pharmacist Clinical Goal(s): o Over the next 180 days, patient will work  with PharmD and providers to maintain LDL goal < 100, achieve Triglyceride goal < 150  . Current regimen:  . Rosuvastatin 37m daily . Fish Oil 10042mdaily . Interventions: o Discussed LDL goal o Discussed limiting fried foods . Patient self care activities - Over the next 180 days, patient will: o Maintain cholesterol medication regimen.   Diabetes Lab Results  Component Value Date/Time   HGBA1C 6.1 06/03/2020 10:15 AM   HGBA1C 6.1 09/23/2019 09:55 AM   . Pharmacist Clinical Goal(s): o Over the next 180 days, patient will work with PharmD and  providers to maintain A1c goal <7% . Current regimen:  o Metformin 50050maily . Interventions: o Discussed DM goal . Patient self care activities - Over the next 180 days, patient will: o Maintain a1c less than 7%  Health Maintenance  . Pharmacist Clinical Goal(s) o Over the next 90 days, patient will work with PharmD and providers to complete health maintenance screenings/vaccinations . Interventions: o Discussed covid booster vaccine . Patient self care activities - Over the next 90 days, patient will: o Receive covid booster vaccine  Medication management . Pharmacist Clinical Goal(s): o Over the next 180 days, patient will work with PharmD and providers to achieve optimal medication adherence . [facility] . Interventions o Comprehensive medication review performed. o Continue current medication management strategy . Patient self care activities - Over the next 180 days, patient will: o Focus on medication adherence by filling and taking medications appropriately  o Take medications as prescribed o Report any questions or concerns to PharmD and/or provider(s)  Initial goal documentation       Hypertension   BP goal is:  <140/90  Office blood pressures are  BP Readings from Last 3 Encounters:  09/09/20 120/76  06/03/20 131/85  05/28/20 (!) 149/61   Patient checks BP at home 1-2x per week Patient home BP readings are ranging: 130/80 per patient  Patient has failed these meds in the past: carvedilol (listed in D/C meds. No apparent reason for D/C), chlorthalidone (stopped at 09/2019 discharge) Patient is currently controlled on the following medications:  . HCTZ 80m62mily (only takes about 3 times per week) . Losartan 100mg74mly  We discussed BP goal  Plan -Continue current medications     Hyperlipidemia   LDL goal < 100 TRIG goal < 150  Last lipids Lab Results  Component Value Date   CHOL 146 06/03/2020   HDL 39.00 (L) 06/03/2020    LDLCALC 39 10/14/2019   LDLDIRECT 72.0 06/03/2020   TRIG 257.0 (H) 06/03/2020   CHOLHDL 4 06/03/2020   Hepatic Function Latest Ref Rng & Units 09/09/2020 05/28/2020 02/26/2020  Total Protein 6.5 - 8.1 g/dL 7.4 7.3 7.1  Albumin 3.5 - 5.0 g/dL 4.5 4.5 4.4  AST 15 - 41 U/L 15 14(L) 12(L)  ALT 0 - 44 U/L _0 Alk Phosphatase 38 - 126 U/L 73 66 80  Total Bilirubin 0.3 - 1.2 mg/dL 0.5 0.6 0.6  Bilirubin, Direct 0.0 - 0.3 mg/dL - - -     The ASCVD Risk score (Goff [place] al., 2013) failed to calculate for the following reasons:   The patient has a prior MI or stroke diagnosis   Patient has failed these meds in past: simvastatin (inefficacy?) Patient is currently controlled on the following medications:  . Rosuvastatin 10mg 8my . Fish Oil 1000mg d70m  Denies eating lots of fried foods Eats a sausage patty daily and 2  fried eggs and fried apples for breakfast Eats more soups  We discussed:  LDL goal and TRIG goal  Plan -Continue current medications  -Limit fried foods  Diabetes   A1c goal <7%  Recent Relevant Labs: Lab Results  Component Value Date/Time   HGBA1C 6.1 06/03/2020 10:15 AM   HGBA1C 6.1 09/23/2019 09:55 AM   GFR 71.62 08/07/2019 11:26 AM   GFR 63.61 04/22/2019 09:46 AM    Last diabetic Eye exam:  Lab Results  Component Value Date/Time   HMDIABEYEEXA No Retinopathy 08/10/2017 12:00 AM    Last diabetic Foot exam:  Lab Results  Component Value Date/Time   HMDIABFOOTEX Done 06/12/2019 12:00 AM     Checking BG: Never (does not have meter)  Recent FBG Readings: Unable to assess  Patient has failed these meds in past: None noted  Patient is currently controlled on the following medications: Marland Kitchen Metformin 556m daily  Denies symptoms of hypolgycemia  We discussed: DM goal  Plan -Continue current medications  Transient LV dysfunction/Heart Failure?   Last ejection fraction: 12/22/2019 60-65%  Patient has failed these meds in past: carvedilol,  chlorthalidone Patient is currently controlled on the following medications:  Furosemide 461mdaily  Potassium Chloride 1064mdaily  Edema? None per patient SOB? No Weighing daily? No, but weekly. Hasn't gain or lost recently  Reports cancer doctor might take him off of furosemide  Plan -Continue current medications   Gout   Uric Acid, Serum  Date Value Ref Range Status  08/30/2006 6.0 2.4 - 7.0 mg/dL Final     Goal Uric Acid < 6 mg/dL   Medications that may increase uric acid levels: Thiazide Diuretics, Loop Diuretics  and Aspirin  Last gout flare: can't recall  Patient has failed these meds in past: None noted  Patient is currently controlled on the following medications:  . Allopurinol 300m79mily  Was told by cancer doctor to continue taking it.  Pt states his feet feel numb and was told to continue therapy   Plan -Continue current medications   Vaccines   Reviewed and discussed patient's vaccination history.    Immunization History  Administered Date(s) Administered  . Fluad Quad(high Dose 65+) 06/20/2019, 10/04/2020  . H1N1 11/12/2008  . Influenza Split 07/17/2014, 07/31/2018  . Influenza Whole 08/04/2010  . Influenza,inj,Quad PF,6+ Mos 06/23/2015  . Influenza-Unspecified 07/23/2013, 07/07/2016, 07/20/2017  . Moderna Sars-Covid-2 Vaccination 03/16/2020, 04/14/2020  . Pneumococcal Conjugate-13 08/12/2014  . Pneumococcal Polysaccharide-23 11/12/2008, 05/28/2019  . Td 12/19/2007  . Tdap 04/04/2018  . Zoster 10/24/2011  . Zoster Recombinat (Shingrix) 12/20/2018, 05/12/2019   Moderna booster? Hasn't done it yet, but plans to get it after 10/14/20  Plan -Recommended patient receive Moderna vaccine in pharmacy.   Medication Management   [facility]  [id number] [id number]: [phone] 336: [phone] Meds Aspirin 81mg 16my   Ketoconazole 2% cream - doesn't feel this works  Plan  Continue current medication management strategy  Follow up: 6 month phone visit  KaneshDe BlanchmD, BCACP Clinical Pharmacist [phone]

## 2020-10-21 NOTE — Patient Instructions (Addendum)
Visit Information  Goals Addressed            This Visit's Progress   . Chronic Care Management Pharmacy Care Plan       CARE PLAN ENTRY (see longitudinal plan of care for additional care plan information)  Current Barriers:  . Chronic Disease Management support, education, and care coordination needs related to Hypertension, Hyperlipidemia, Diabetes, LV Dysfunction/Heart Failure?, Gout   Hypertension BP Readings from Last 3 Encounters:  09/09/20 120/76  06/03/20 131/85  05/28/20 (!) 149/61   . Pharmacist Clinical Goal(s): o Over the next 180 days, patient will work with PharmD and providers to maintain BP goal <140/90 . Current regimen:  . HCTZ 25mg  daily  . Losartan 100mg  daily . Interventions: o Discussed BP goal . Patient self care activities - Over the next 180 days, patient will: o Maintain hypertension medication regimen.   Hyperlipidemia Lab Results  Component Value Date/Time   LDLCALC 39 10/14/2019 05:26 AM   LDLDIRECT 72.0 06/03/2020 10:15 AM   . Pharmacist Clinical Goal(s): o Over the next 180 days, patient will work with PharmD and providers to maintain LDL goal < 100, achieve Triglyceride goal < 150  . Current regimen:  . Rosuvastatin 10mg  daily . Fish Oil 1000mg  daily . Interventions: o Discussed LDL goal o Discussed limiting fried foods . Patient self care activities - Over the next 180 days, patient will: o Maintain cholesterol medication regimen.   Diabetes Lab Results  Component Value Date/Time   HGBA1C 6.1 06/03/2020 10:15 AM   HGBA1C 6.1 09/23/2019 09:55 AM   . Pharmacist Clinical Goal(s): o Over the next 180 days, patient will work with PharmD and providers to maintain A1c goal <7% . Current regimen:  o Metformin 500mg  daily . Interventions: o Discussed DM goal . Patient self care activities - Over the next 180 days, patient will: o Maintain a1c less than 7%  Health Maintenance  . Pharmacist Clinical Goal(s) o Over the next 90  days, patient will work with PharmD and providers to complete health maintenance screenings/vaccinations . Interventions: o Discussed covid booster vaccine . Patient self care activities - Over the next 90 days, patient will: o Receive covid booster vaccine  Medication management . Pharmacist Clinical Goal(s): o Over the next 180 days, patient will work with PharmD and providers to achieve optimal medication adherence . [facility] . Interventions o Comprehensive medication review performed. o Continue current medication management strategy . Patient self care activities - Over the next 180 days, patient will: o Focus on medication adherence by filling and taking medications appropriately  o Take medications as prescribed o Report any questions or concerns to PharmD and/or provider(s)  Initial goal documentation        [name] [name] was given information about Chronic Care Management services today including:  1. CCM service includes personalized support from designated clinical staff supervised by his physician, including individualized plan of care and coordination with other care providers 2. 24/7 contact phone numbers for assistance for urgent and routine care needs. 3. Standard insurance, coinsurance, copays and deductibles apply for chronic care management only during months in which we provide at least 20 minutes of these services. Most insurances cover these services at 100%, however patients may be responsible for any copay, coinsurance and/or deductible if applicable. This service may help you avoid the need for more expensive face-to-face services. 4. Only one practitioner may furnish and bill the service in a calendar month. 5. The patient may stop  CCM services at any time (effective at the end of the month) by phone call to the office staff.  Patient agreed to services and verbal consent obtained.   The patient verbalized understanding of instructions, educational  materials, and care plan provided today and agreed to receive a mailed copy of patient instructions, educational materials, and care plan.  Telephone follow up appointment with pharmacy team member scheduled for: 04/12/2021  Melvenia Beam [name] [name], [facility]

## 2020-10-29 ENCOUNTER — Other Ambulatory Visit: Payer: Self-pay

## 2020-11-01 ENCOUNTER — Other Ambulatory Visit: Payer: Self-pay | Admitting: Internal Medicine

## 2020-11-09 IMAGING — US RIGHT LOWER EXTREMITY VENOUS ULTRASOUND
1 series · 13 of 24 positions shown · non-contrast
Comparison: None.

CLINICAL DATA: Right leg swelling.  Rule out DVT



[Series 1: right lower extremity venous ultrasound · 13 of 31 slices shown]
[im 1/31]
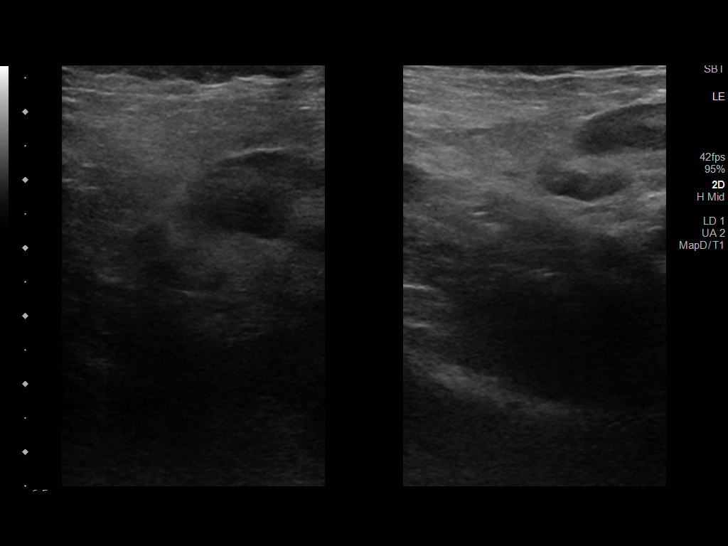
[im 3/31]
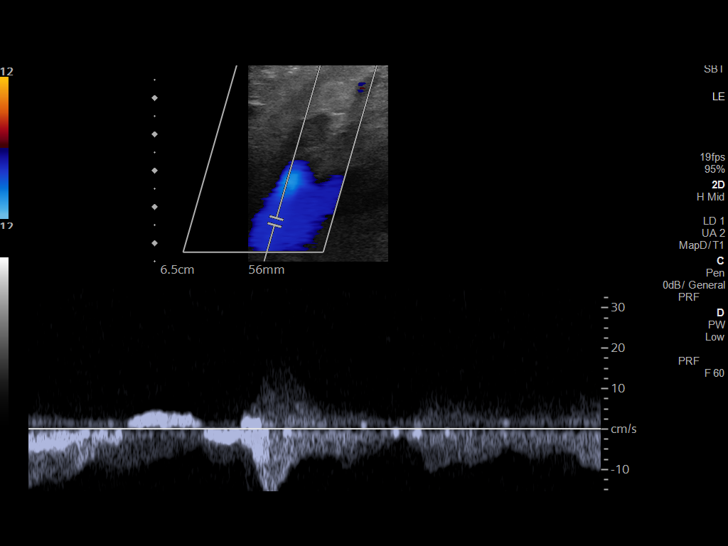
[im 6/31]
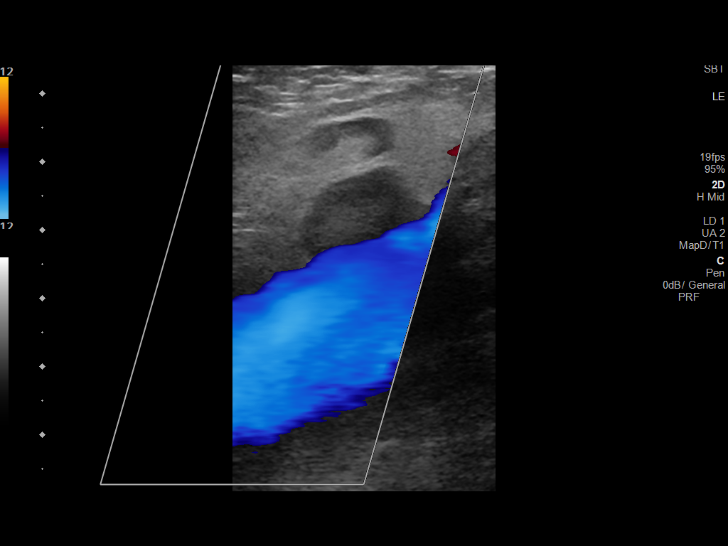
[im 8/31]
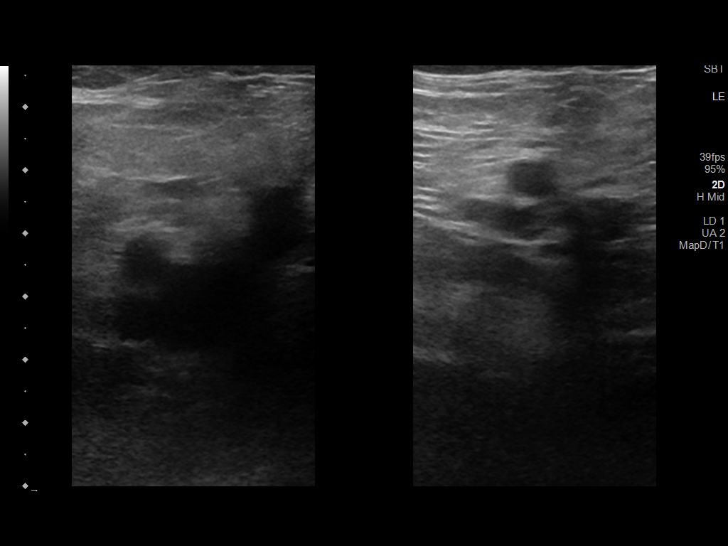
[im 11/31]
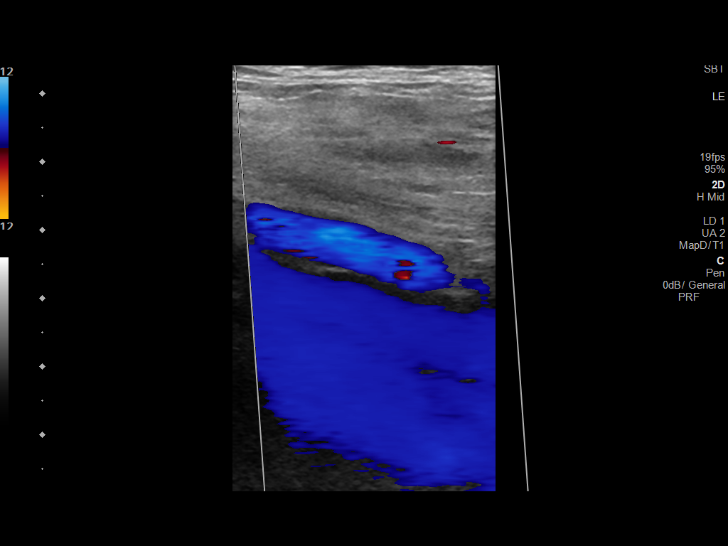
[im 14/31]
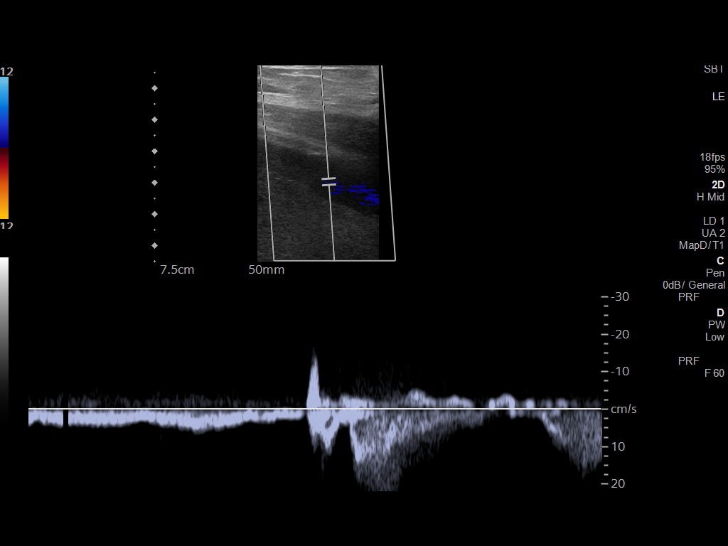
[im 16/31]
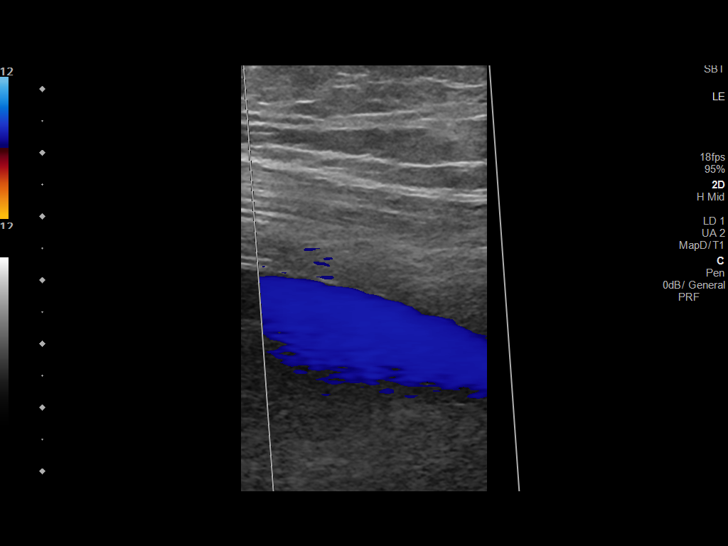
[im 17/31]
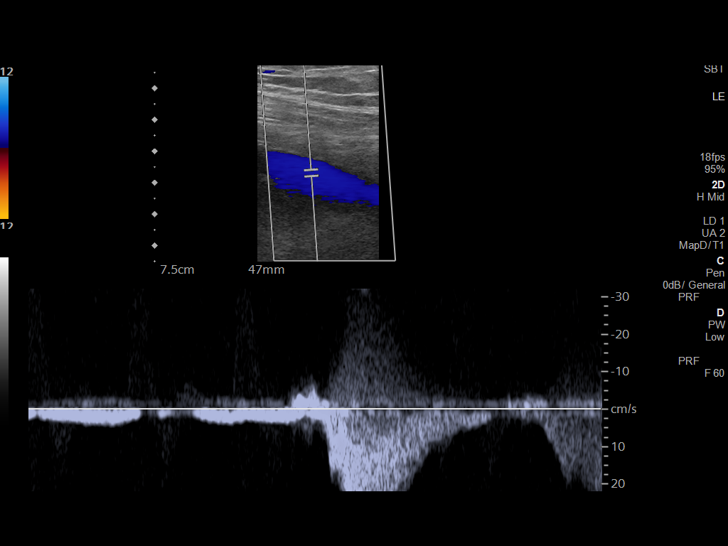
[im 20/31]
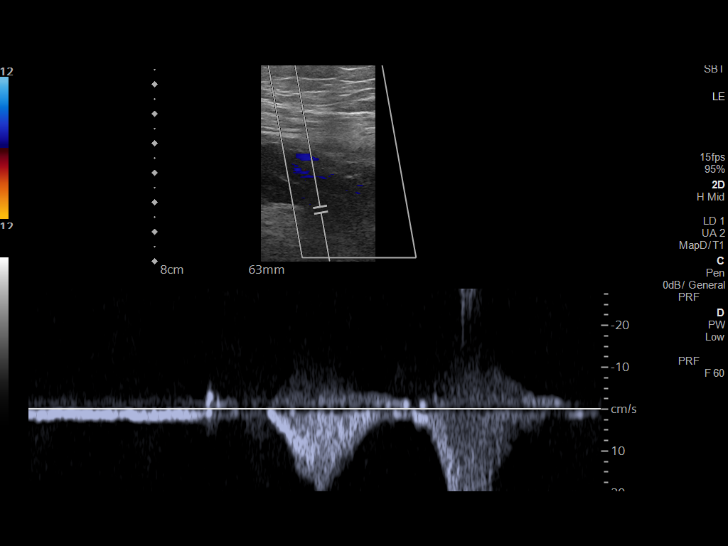
[im 23/31]
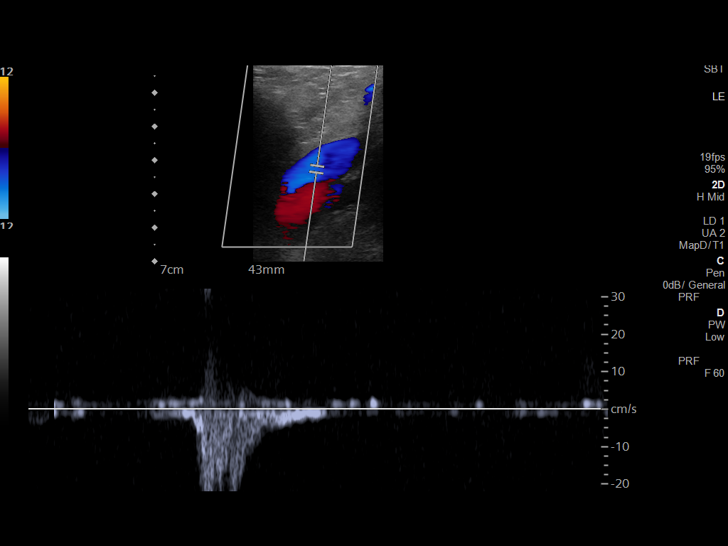
[im 25/31]
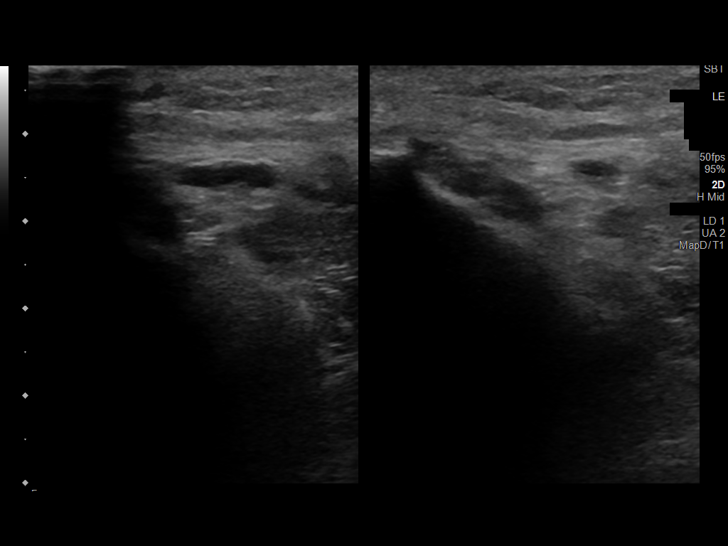
[im 28/31]
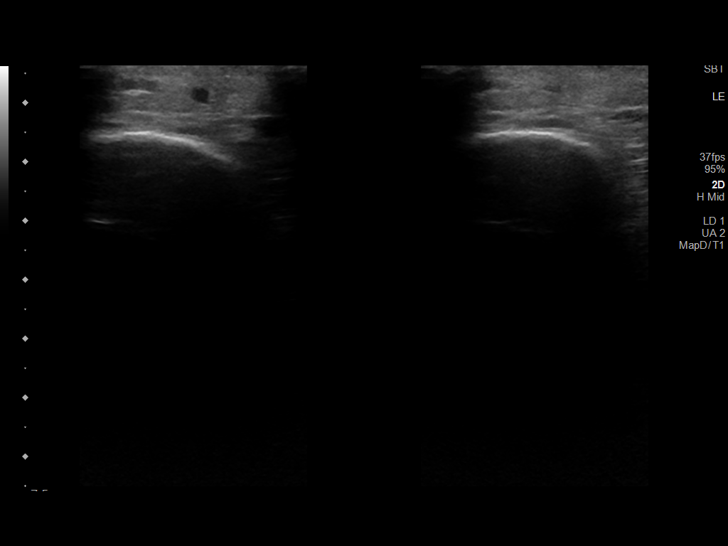
[im 31/31]
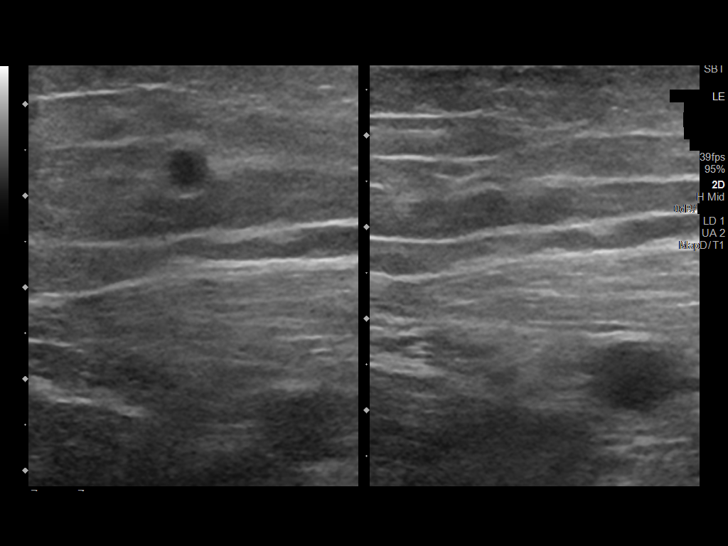

[13 of 24 positions shown; findings below may reference images not displayed]

FINDINGS: Contralateral Common Femoral Vein: Respiratory phasicity is normal
and symmetric with the symptomatic side. No evidence of thrombus.
Normal compressibility.

Common Femoral Vein: No evidence of thrombus. Normal
compressibility, respiratory phasicity and response to augmentation.

Saphenofemoral Junction: No evidence of thrombus. Normal
compressibility and flow on color Doppler imaging.

Profunda Femoral Vein: No evidence of thrombus. Normal
compressibility and flow on color Doppler imaging.

Femoral Vein: No evidence of thrombus. Normal compressibility,
respiratory phasicity and response to augmentation.

Popliteal Vein: No evidence of thrombus. Normal compressibility,
respiratory phasicity and response to augmentation.

Calf Veins: No evidence of thrombus. Normal compressibility and flow
on color Doppler imaging.

Superficial Great Saphenous Vein: No evidence of thrombus. Normal
compressibility.

Venous Reflux:  None.

Other Findings:  None.
IMPRESSION: No evidence of deep venous thrombosis.

## 2020-11-30 DIAGNOSIS — H524 Presbyopia: Secondary | ICD-10-CM | POA: Diagnosis not present

## 2020-11-30 DIAGNOSIS — D3141 Benign neoplasm of right ciliary body: Secondary | ICD-10-CM | POA: Diagnosis not present

## 2020-11-30 DIAGNOSIS — E119 Type 2 diabetes mellitus without complications: Secondary | ICD-10-CM | POA: Diagnosis not present

## 2020-11-30 LAB — HM DIABETES EYE EXAM

## 2020-12-01 ENCOUNTER — Other Ambulatory Visit: Payer: Self-pay | Admitting: Internal Medicine

## 2020-12-01 ENCOUNTER — Encounter: Payer: Self-pay | Admitting: Internal Medicine

## 2020-12-01 NOTE — Telephone Encounter (Signed)
Losartan 100mg  on back order. Okay to switch to losartan 50mg  2 tabs daily?

## 2020-12-01 NOTE — Telephone Encounter (Signed)
Yes

## 2020-12-02 MED ORDER — LOSARTAN POTASSIUM 50 MG PO TABS
100.0000 mg | ORAL_TABLET | Freq: Every day | ORAL | 1 refills | Status: DC
Start: 2020-12-02 — End: 2021-05-30

## 2020-12-07 IMAGING — DX CHEST - 2 VIEW
2 series · 2 of 2 positions shown · non-contrast
Comparison: 09/04/2012

CLINICAL DATA: Shortness of Breath

EXAM:
CHEST - 2 VIEW

[chest pa]
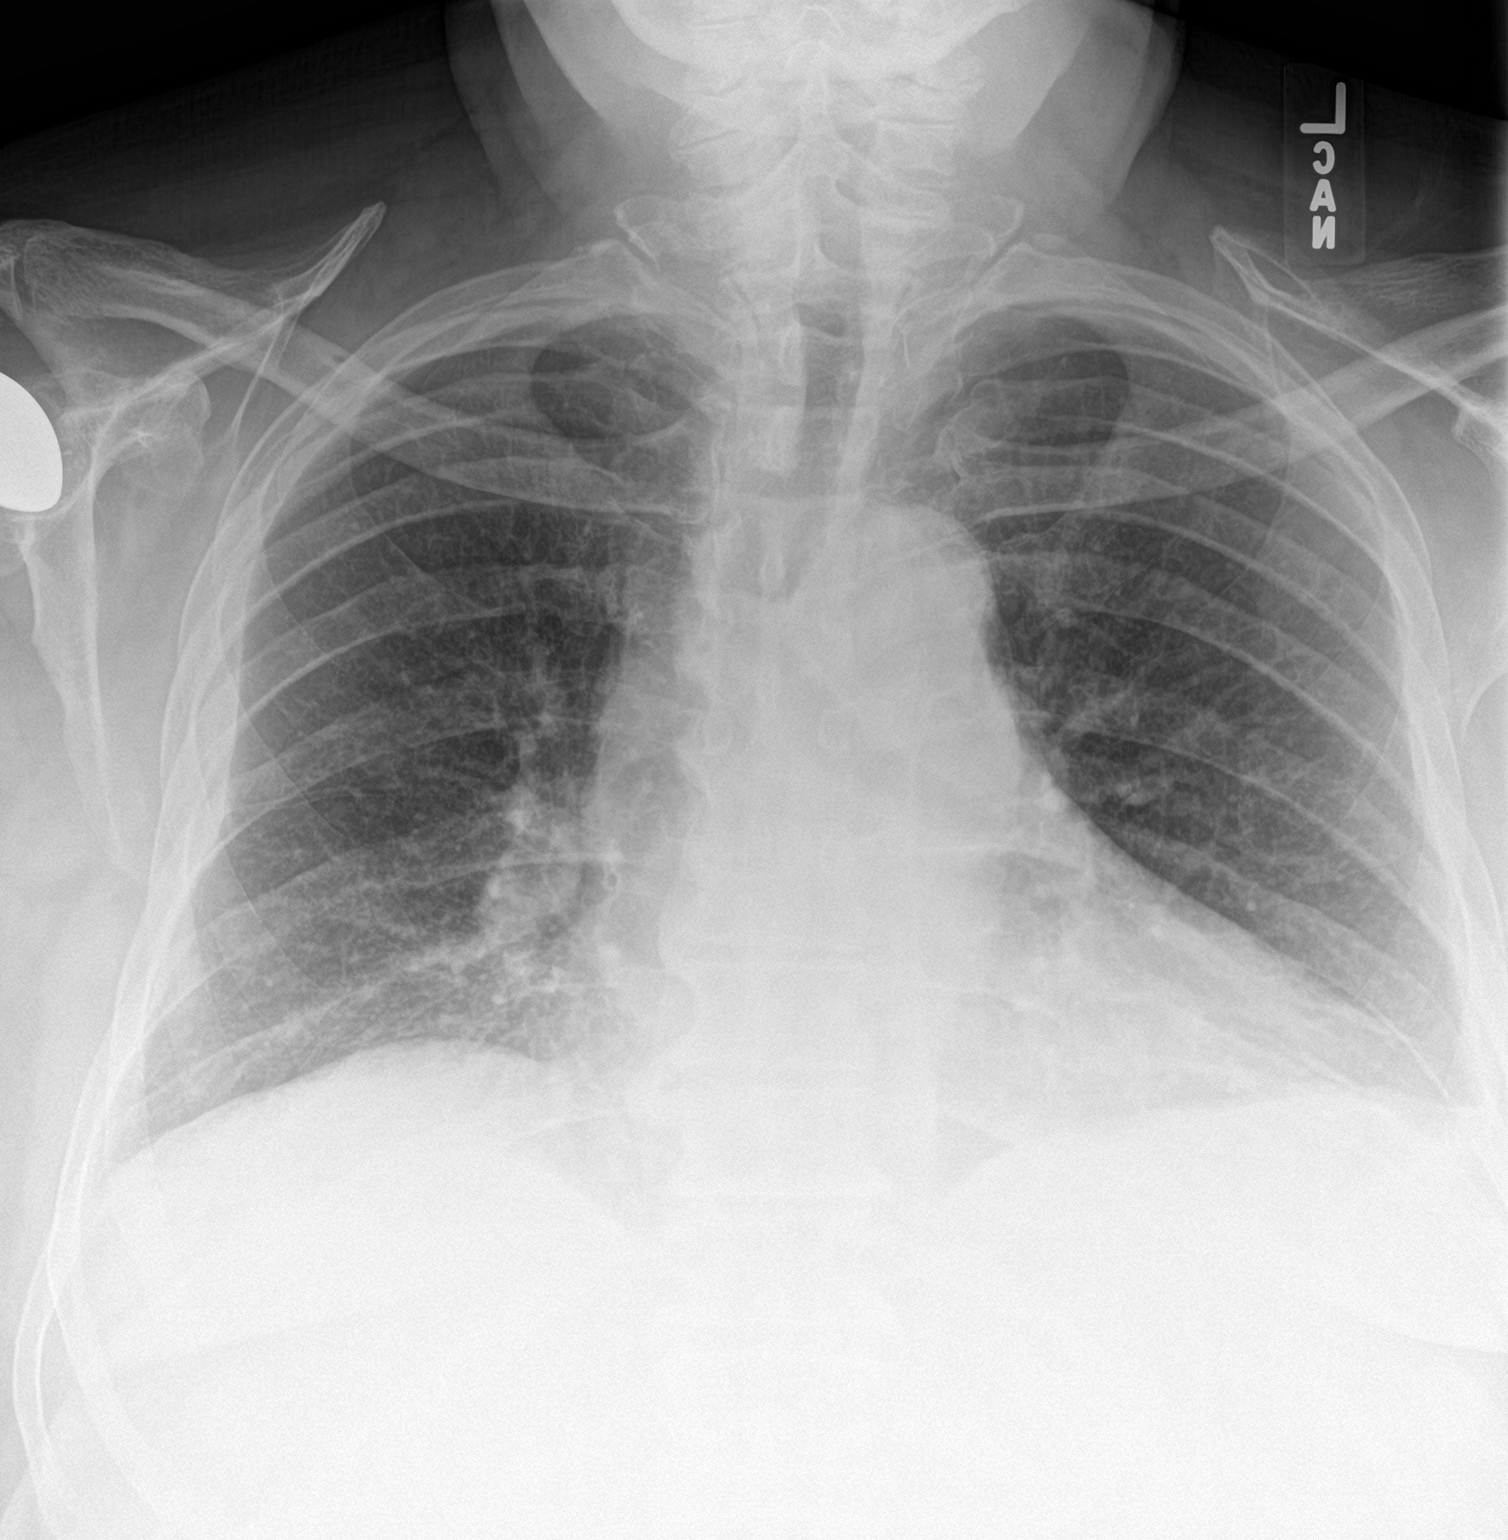

[chest lat]
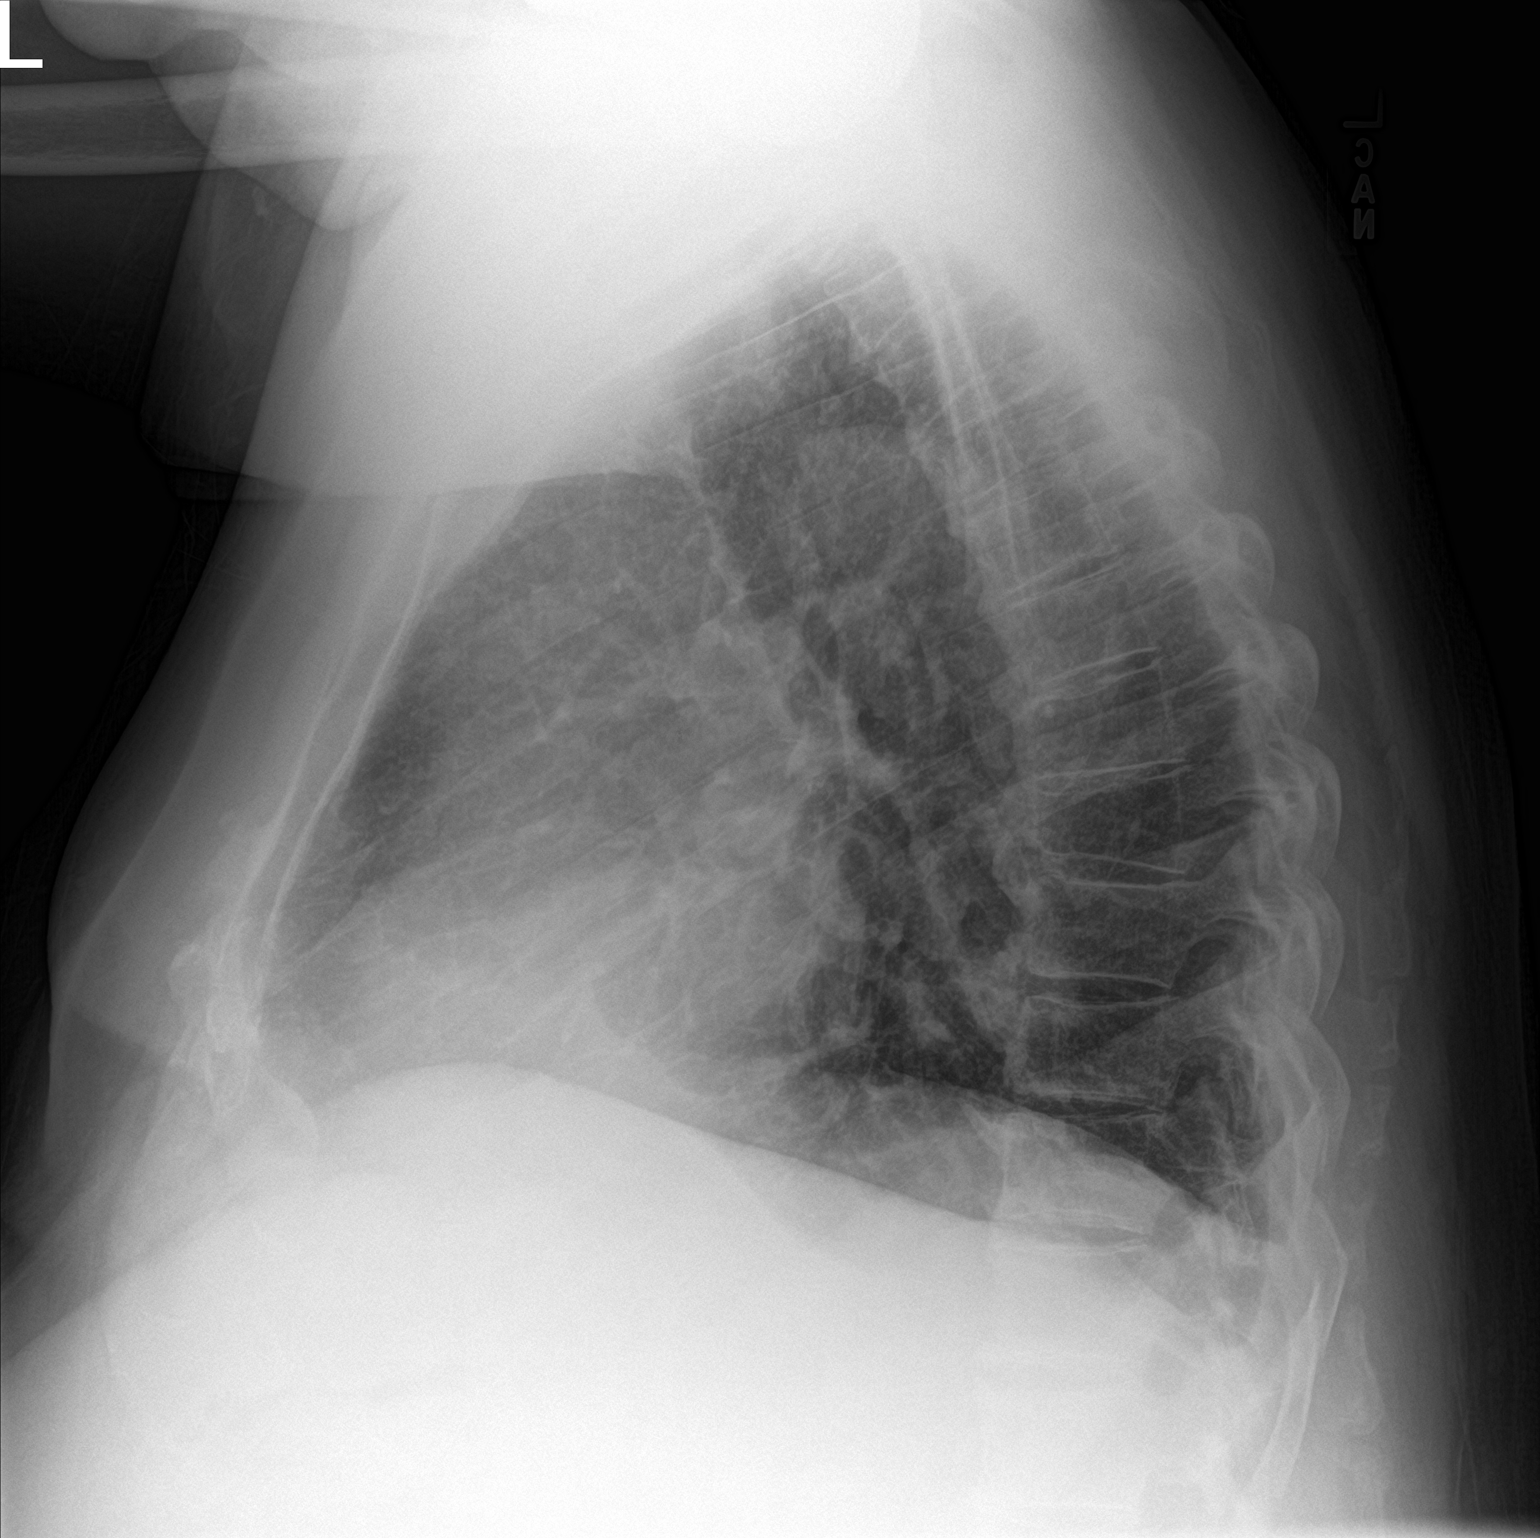

[2 of 2 positions shown; findings below may reference images not displayed]

FINDINGS: Cardiomegaly. Bibasilar atelectasis. No overt edema or effusions. No
acute bony abnormality.
IMPRESSION: Cardiomegaly.  Bibasilar atelectasis.

## 2020-12-08 ENCOUNTER — Ambulatory Visit (INDEPENDENT_AMBULATORY_CARE_PROVIDER_SITE_OTHER): Payer: HMO | Admitting: Internal Medicine

## 2020-12-08 ENCOUNTER — Other Ambulatory Visit: Payer: Self-pay

## 2020-12-08 ENCOUNTER — Encounter: Payer: Self-pay | Admitting: Internal Medicine

## 2020-12-08 VITALS — BP 153/77 | HR 98 | Temp 97.7°F | Ht 72.0 in | Wt 297.0 lb

## 2020-12-08 DIAGNOSIS — E1159 Type 2 diabetes mellitus with other circulatory complications: Secondary | ICD-10-CM | POA: Diagnosis not present

## 2020-12-08 DIAGNOSIS — Z0001 Encounter for general adult medical examination with abnormal findings: Secondary | ICD-10-CM

## 2020-12-08 DIAGNOSIS — Z0000 Encounter for general adult medical examination without abnormal findings: Secondary | ICD-10-CM | POA: Diagnosis not present

## 2020-12-08 DIAGNOSIS — I152 Hypertension secondary to endocrine disorders: Secondary | ICD-10-CM | POA: Diagnosis not present

## 2020-12-08 DIAGNOSIS — E1169 Type 2 diabetes mellitus with other specified complication: Secondary | ICD-10-CM | POA: Diagnosis not present

## 2020-12-08 LAB — COMPREHENSIVE METABOLIC PANEL WITH GFR
ALT: 14 U/L (ref 0–53)
AST: 13 U/L (ref 0–37)
Albumin: 4.2 g/dL (ref 3.5–5.2)
Alkaline Phosphatase: 77 U/L (ref 39–117)
BUN: 14 mg/dL (ref 6–23)
CO2: 28 meq/L (ref 19–32)
Calcium: 9.4 mg/dL (ref 8.4–10.5)
Chloride: 102 meq/L (ref 96–112)
Creatinine, Ser: 1.01 mg/dL (ref 0.40–1.50)
GFR: 71.28 mL/min
Glucose, Bld: 102 mg/dL — ABNORMAL HIGH (ref 70–99)
Potassium: 4.5 meq/L (ref 3.5–5.1)
Sodium: 139 meq/L (ref 135–145)
Total Bilirubin: 0.5 mg/dL (ref 0.2–1.2)
Total Protein: 6.7 g/dL (ref 6.0–8.3)

## 2020-12-08 LAB — HEMOGLOBIN A1C: Hgb A1c MFr Bld: 6.2 % (ref 4.6–6.5)

## 2020-12-08 NOTE — Progress Notes (Signed)
Subjective:    Patient ID: [name] [name], male    DOB: [birth year], 80 y.o.   MRN: [id number]  DOS:  12/08/2020 Type of visit - description: CPX  Since the last office visit he is doing well. Has no major concerns  Review of Systems   A 14 point review of systems is negative    Past Medical History:  Diagnosis Date  . Arthritis   . Complication of anesthesia     had spinal with knee surgeries  -"heart rate too low" for general  . Detached retina, left    L eye normal vision, reports that he has had a retina procedure in a doctor's office at [facility]   . Diabetes mellitus without complication ([place])   . Dysrhythmia    slight irregular rate  . GERD (gastroesophageal reflux disease)    hx no problems now  . H/O echocardiogram 2014  . History of hiatal hernia    ?20 yrs ago  . Hyperopia 2016  . Hypertension   . Lumbar stenosis with neurogenic claudication   . MG, ocular (myasthenia gravis) ([place])    ? of L eye,   . Prediabetes   . Presbyopia OU 2016    Past Surgical History:  Procedure Laterality Date  . COLONOSCOPY    . HERNIA REPAIR     bilateral inguinal hernia  . HERNIA REPAIR     umbilical  . IR IMAGING GUIDED PORT INSERTION  09/16/2019  . IR REMOVAL TUN ACCESS W/ PORT W/O FL MOD SED  03/04/2020  . JOINT REPLACEMENT  2009   right knee  . LUMBAR LAMINECTOMY/DECOMPRESSION MICRODISCECTOMY N/A 06/06/2017   Procedure: Decompression L3-5, insitu fusion L3-5 ;  Surgeon: Melina Schools, MD;  Location: [place];  Service: Orthopedics;  Laterality: N/A;  . LYMPH NODE BIOPSY Right 09/01/2019   Procedure: CERVICAL LYMPH NODE BIOPSY;  Surgeon: [name], MD;  Location: [place];  Service: ENT;  Laterality: Right;  . MIDDLE EAR SURGERY Right    ear -patched hole in ear drum  . RIGHT/LEFT HEART CATH AND CORONARY ANGIOGRAPHY N/A 11/11/2019   Procedure: RIGHT/LEFT HEART CATH AND CORONARY ANGIOGRAPHY;  Surgeon: Jolaine Artist, MD;  Location: [place] [facility];  Service:  Cardiovascular;  Laterality: N/A;  . TOTAL KNEE ARTHROPLASTY  09/10/2012   Procedure: TOTAL KNEE ARTHROPLASTY;  Surgeon: Tobi Bastos, MD;  Location: [facility];  Service: Orthopedics;  Laterality: Left;  . TOTAL SHOULDER ARTHROPLASTY Right 06/22/2016   Procedure: RIGHT TOTAL SHOULDER ARTHROPLASTY;  Surgeon: [name], MD;  Location: [place];  Service: Orthopedics;  Laterality: Right;    Allergies as of 12/08/2020      Reactions   Doxycycline Rash      Medication List       Accurate as of December 08, 2020 11:59 PM. If you have any questions, ask your nurse or doctor.        STOP taking these medications   hydrochlorothiazide 25 MG tablet Commonly known as: HYDRODIURIL Stopped by: [name], MD   ketoconazole 2 % cream Commonly known as: NIZORAL Stopped by: [name], MD     TAKE these medications   allopurinol 300 MG tablet Commonly known as: ZYLOPRIM Take 1 tablet (300 mg total) by mouth daily.   aspirin 81 MG chewable tablet Chew 81 mg by mouth daily.   carvedilol 12.5 MG tablet Commonly known as: COREG Take 12.5 mg by mouth 2 (two) times daily.   Fish Oil 1000 MG  Caps Take 1,000 mg by mouth daily.   furosemide 40 MG tablet Commonly known as: Lasix Take 1 tablet (40 mg total) by mouth daily as needed for fluid or edema.   losartan 50 MG tablet Commonly known as: COZAAR Take 2 tablets (100 mg total) by mouth daily.   metFORMIN 500 MG tablet Commonly known as: GLUCOPHAGE Take 1 tablet (500 mg total) by mouth daily with breakfast.   potassium chloride 10 MEQ tablet Commonly known as: Klor-Con M10 Take 1 tablet (10 mEq total) by mouth daily.   rosuvastatin 10 MG tablet Commonly known as: Crestor Take 1 tablet (10 mg total) by mouth daily.          Objective:   Physical Exam BP (!) 153/77 (BP Location: Left Arm, Patient Position: Sitting, Cuff Size: Large)   Pulse 98   Temp 97.7 F (36.5 C) (Oral)   Ht 6' (1.829 m)   Wt 297 lb (134.7 kg)   SpO2  (!) 53%   BMI 40.28 kg/m  General: Well developed, NAD, BMI noted Neck: No  thyromegaly  HEENT:  Normocephalic . Face symmetric, atraumatic Lungs:  CTA B Normal respiratory effort, no intercostal retractions, no accessory muscle use. Heart: RRR,  no murmur.  Abdomen:  Not distended, soft, non-tender. No rebound or rigidity.   Lower extremities: no pretibial edema bilaterally  Skin: Exposed areas without rash. Not pale. Not jaundice Neurologic:  alert & oriented X3.  Speech normal, gait appropriate for age and unassisted Strength symmetric and appropriate for age.  Psych: Cognition and judgment appear intact.  Cooperative with normal attention span and concentration.  Behavior appropriate. No anxious or depressed appearing.     Assessment    Assessment DM HTN Hyperlipidemia Morbid obesity Snoring  CV: -Palpitations, PVCs, h/o bradycardia,s/p. Dr Adin Hector day monitor, echo 2014: Normal LV, some LVH due to HTN - new onset acute syst CHF 10/2019>>  S/p catheterization 11/11/2019: No CAD: Normal EF. 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 HTN: Currently on losartan 50 mg 2 tablets daily, carvedilol 12.5 mg bid, Lasix and potassium.   Apparently not on HCTZ.  Reason? BP today 153/70.  At home 147/80.   Will not restart HCTZ monitor BPs, consider increase carvedilol.  Reassess in 3 months Hyperlipidemia: Well-controlled on Crestor History of CHF: He seems to be doing well, continue Lasix, potassium, losartan. 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 Social: Lives by himself, wife is in a nursing home, completely independent, drives etc. RTC 3 months  In addition to CPX, I address his chronic medical problems and reviewed the chart.  This visit occurred during the SARS-CoV-2  public health emergency.  Safety protocols were in place, including screening questions prior to the visit, additional usage of staff PPE, and extensive cleaning of exam room while observing appropriate contact time as indicated for disinfecting solutions.

## 2020-12-08 NOTE — Patient Instructions (Addendum)
Please bring [place] a copy of your Advance Directives (Living will and Healthcare Power of attorney). This is for your chart.    Please take your medicines following the list provided today and nothing else. Check the  blood pressure 2 or 3 times a week  BP GOAL is between 110/65 and  135/85. If it is consistently higher or lower, let me know     GO TO THE LAB : Get the blood work     [place], [place] back for a checkup in 2 months

## 2020-12-09 ENCOUNTER — Encounter: Payer: Self-pay | Admitting: Internal Medicine

## 2020-12-09 NOTE — Assessment & Plan Note (Signed)
Here for CPX DM: Currently on Metformin, check A1c. HTN: Currently on losartan 50 mg 2 tablets daily, carvedilol 12.5 mg bid, Lasix and potassium.   Apparently not on HCTZ.  Reason? BP today 153/70.  At home 147/80.   Will not restart HCTZ monitor BPs, consider increase carvedilol.  Reassess in 3 months Hyperlipidemia: Well-controlled on Crestor History of CHF: He seems to be doing well, continue Lasix, potassium, losartan. 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 Social: Lives by himself, wife is in a nursing home, completely independent, drives etc. RTC 3 months

## 2020-12-09 NOTE — Assessment & Plan Note (Signed)
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  Recommend no further screening -Labs: CMP,A1c  -Diet and exercise: discussed  - has advance directives: request a copy

## 2020-12-13 ENCOUNTER — Encounter: Payer: Self-pay | Admitting: Hematology & Oncology

## 2020-12-13 ENCOUNTER — Other Ambulatory Visit: Payer: Self-pay

## 2020-12-13 ENCOUNTER — Inpatient Hospital Stay: Payer: HMO | Attending: Hematology & Oncology

## 2020-12-13 ENCOUNTER — Telehealth: Payer: Self-pay

## 2020-12-13 ENCOUNTER — Inpatient Hospital Stay (HOSPITAL_BASED_OUTPATIENT_CLINIC_OR_DEPARTMENT_OTHER): Payer: HMO | Admitting: Hematology & Oncology

## 2020-12-13 VITALS — BP 142/64 | HR 68 | Temp 98.5°F | Resp 18 | Wt 295.0 lb

## 2020-12-13 DIAGNOSIS — C8440 Peripheral T-cell lymphoma, not classified, unspecified site: Secondary | ICD-10-CM | POA: Insufficient documentation

## 2020-12-13 DIAGNOSIS — Z9221 Personal history of antineoplastic chemotherapy: Secondary | ICD-10-CM | POA: Insufficient documentation

## 2020-12-13 DIAGNOSIS — E113299 Type 2 diabetes mellitus with mild nonproliferative diabetic retinopathy without macular edema, unspecified eye: Secondary | ICD-10-CM

## 2020-12-13 LAB — CMP (CANCER CENTER ONLY)
ALT: 13 U/L (ref 0–44)
AST: 14 U/L — ABNORMAL LOW (ref 15–41)
Albumin: 4.4 g/dL (ref 3.5–5.0)
Alkaline Phosphatase: 79 U/L (ref 38–126)
Anion gap: 11 (ref 5–15)
BUN: 15 mg/dL (ref 8–23)
CO2: 27 mmol/L (ref 22–32)
Calcium: 9.5 mg/dL (ref 8.9–10.3)
Chloride: 102 mmol/L (ref 98–111)
Creatinine: 1.11 mg/dL (ref 0.61–1.24)
GFR, Estimated: >60 mL/min (ref >60)
Glucose, Bld: 173 mg/dL — ABNORMAL HIGH (ref 70–99)
Potassium: 4.2 mmol/L (ref 3.5–5.1)
Sodium: 140 mmol/L (ref 135–145)
Total Bilirubin: 0.6 mg/dL (ref 0.3–1.2)
Total Protein: 6.9 g/dL (ref 6.5–8.1)

## 2020-12-13 LAB — CBC WITH DIFFERENTIAL (CANCER CENTER ONLY)
Abs Immature Granulocytes: 0.04 10*3/uL (ref 0.00–0.07)
Basophils Absolute: 0.1 10*3/uL (ref 0.0–0.1)
Basophils Relative: 1 %
Eosinophils Absolute: 0.3 10*3/uL (ref 0.0–0.5)
Eosinophils Relative: 4 %
HCT: 37.5 % — ABNORMAL LOW (ref 39.0–52.0)
Hemoglobin: 12.8 g/dL — ABNORMAL LOW (ref 13.0–17.0)
Immature Granulocytes: 1 %
Lymphocytes Relative: 15 %
Lymphs Abs: 1.1 10*3/uL (ref 0.7–4.0)
MCH: 31.2 pg (ref 26.0–34.0)
MCHC: 34.1 g/dL (ref 30.0–36.0)
MCV: 91.5 fL (ref 80.0–100.0)
Monocytes Absolute: 0.6 10*3/uL (ref 0.1–1.0)
Monocytes Relative: 8 %
Neutro Abs: 5.4 10*3/uL (ref 1.7–7.7)
Neutrophils Relative %: 71 %
Platelet Count: 229 10*3/uL (ref 150–400)
RBC: 4.1 MIL/uL — ABNORMAL LOW (ref 4.22–5.81)
RDW: 13.3 % (ref 11.5–15.5)
WBC Count: 7.5 10*3/uL (ref 4.0–10.5)
nRBC: 0 % (ref 0.0–0.2)

## 2020-12-13 LAB — SAVE SMEAR(SSMR), FOR PROVIDER SLIDE REVIEW

## 2020-12-13 LAB — URIC ACID: Uric Acid, Serum: 5.2 mg/dL (ref 3.7–8.6)

## 2020-12-13 LAB — LACTATE DEHYDROGENASE: LDH: 145 U/L (ref 98–192)

## 2020-12-13 NOTE — Progress Notes (Signed)
Hematology and Oncology Follow Up Visit  [name] [name] [id number] 1941-08-10 80 y.o. 12/13/2020   Principle Diagnosis:   Stage IV angioimmunoblastic T-cell NHL  Current Therapy:    S/p CHP + Adcetris x 6 cycles -- completed on 01/19/2020     Interim History:  [name] [name] is back for follow-up.  His wife is still in the nursing home.  She has dementia.  Some days she is doing well.  Other days, she really has a hard time even knowing who he is.  He is getting ready to go down to [place] this weekend to play at a church.  He plays gospel music in a band.  He has had no problems with cough or shortness of breath.  Again, we went through some of his medications.  I am trying to get him to take his medications properly.  He has had no change in bowel or bladder habits.  He has had no nausea or vomiting.  There has been no cough or shortness of breath.  He has had some chronic leg swelling.  He did make it through all the holidays and had no problems.  Overall, his performance status is ECOG 1.    Medications:  Current Outpatient Medications:  .  allopurinol (ZYLOPRIM) 300 MG tablet, Take 1 tablet (300 mg total) by mouth daily., Disp: 90 tablet, Rfl: 1 .  aspirin 81 MG chewable tablet, Chew 81 mg by mouth daily., Disp: , Rfl:  .  carvedilol (COREG) 12.5 MG tablet, Take 12.5 mg by mouth 2 (two) times daily., Disp: , Rfl:  .  furosemide (LASIX) 40 MG tablet, Take 1 tablet (40 mg total) by mouth daily as needed for fluid or edema., Disp: 30 tablet, Rfl: 11 .  losartan (COZAAR) 50 MG tablet, Take 2 tablets (100 mg total) by mouth daily., Disp: 180 tablet, Rfl: 1 .  metFORMIN (GLUCOPHAGE) 500 MG tablet, Take 1 tablet (500 mg total) by mouth daily with breakfast., Disp: 90 tablet, Rfl: 0 .  Omega-3 Fatty Acids (FISH OIL) 1000 MG CAPS, Take 1,000 mg by mouth daily., Disp: , Rfl:  .  potassium chloride (KLOR-CON M10) 10 MEQ tablet, Take 1 tablet (10 mEq total) by mouth daily., Disp: 90  tablet, Rfl: 1 .  rosuvastatin (CRESTOR) 10 MG tablet, Take 1 tablet (10 mg total) by mouth daily., Disp: 90 tablet, Rfl: 3  Allergies:  Allergies  Allergen Reactions  . Doxycycline Rash    Past Medical History, Surgical history, Social history, and Family History were reviewed and updated.  Review of Systems: Review of Systems  Constitutional: Negative.   HENT:  Negative.   Eyes: Negative.   Respiratory: Positive for shortness of breath.   Cardiovascular: Positive for leg swelling.  Gastrointestinal: Positive for nausea.  Endocrine: Negative.   Genitourinary: Negative.    Musculoskeletal: Positive for arthralgias.  Skin: Negative.   Neurological: Negative.   Hematological: Negative.   Psychiatric/Behavioral: Negative.     Physical Exam:  weight is 295 lb (133.8 kg). His oral temperature is 98.5 F (36.9 C). His blood pressure is 142/64 (abnormal) and his pulse is 68. His respiration is 18 and oxygen saturation is 99%.   Wt Readings from Last 3 Encounters:  12/13/20 295 lb (133.8 kg)  12/08/20 297 lb (134.7 kg)  09/09/20 288 lb (130.6 kg)    Physical Exam Vitals reviewed.  HENT:     Head: Normocephalic and atraumatic.  Eyes:     Pupils: Pupils are equal,  round, and reactive to light.  Cardiovascular:     Rate and Rhythm: Normal rate and regular rhythm.     Heart sounds: Normal heart sounds.  Pulmonary:     Effort: Pulmonary effort is normal.     Breath sounds: Normal breath sounds.  Abdominal:     General: Bowel sounds are normal.     Palpations: Abdomen is soft.  Musculoskeletal:        General: No tenderness or deformity. Normal range of motion.     Cervical back: Normal range of motion.  Lymphadenopathy:     Cervical: No cervical adenopathy.  Skin:    General: Skin is warm and dry.     Findings: No erythema or rash.  Neurological:     Mental Status: He is alert and oriented to person, place, and time.  Psychiatric:        Behavior: Behavior normal.         Thought Content: Thought content normal.        Judgment: Judgment normal.      Lab Results  Component Value Date   WBC 7.5 12/13/2020   HGB 12.8 (L) 12/13/2020   HCT 37.5 (L) 12/13/2020   MCV 91.5 12/13/2020   PLT 229 12/13/2020     Chemistry      Component Value Date/Time   NA 140 12/13/2020 0949   K 4.2 12/13/2020 0949   CL 102 12/13/2020 0949   CO2 27 12/13/2020 0949   BUN 15 12/13/2020 0949   CREATININE 1.11 12/13/2020 0949   CREATININE 1.41 (H) 06/06/2019 1528      Component Value Date/Time   CALCIUM 9.5 12/13/2020 0949   ALKPHOS 79 12/13/2020 0949   AST 14 (L) 12/13/2020 0949   ALT 13 12/13/2020 0949   BILITOT 0.6 12/13/2020 0949      Impression and Plan: [name] [name] is a very nice 80 year old white male.  He had stage IV T-cell lymphoma.  This was an angioimmunoblastic lymphoma.  He was treated with chemotherapy and targeted therapy.  He completed 6 hours of treatment back on 01/19/2020.  He went into remission.  He is still in remission.  I do not see where have to do any scans on him right now.  His lab work looks fine.  We will plan to get back in about 4 months or so.  I think this would be very reasonable.   [name], MD 2/21/202211:01 AM

## 2020-12-13 NOTE — Telephone Encounter (Signed)
APPTS MADE FOR PT PER LOS AND WRITTEN ON HIS PAPER PER HIS REQUEST    Rosmery Duggin

## 2020-12-19 ENCOUNTER — Other Ambulatory Visit: Payer: Self-pay | Admitting: Internal Medicine

## 2020-12-27 ENCOUNTER — Telehealth: Payer: Self-pay

## 2020-12-27 DIAGNOSIS — D849 Immunodeficiency, unspecified: Secondary | ICD-10-CM

## 2020-12-27 DIAGNOSIS — C8440 Peripheral T-cell lymphoma, not classified, unspecified site: Secondary | ICD-10-CM

## 2020-12-27 NOTE — Telephone Encounter (Signed)
-----   Message from Colon Branch, MD sent at 12/26/2020  8:00 PM EST ----- Regarding: Open a phone note, call patient, recommend Evusheld, referral if the patient is agreeable. I actually talked to him briefly at the last visit, if he has questions I will be happy to talk to him

## 2020-12-27 NOTE — Telephone Encounter (Signed)
Referral placed.

## 2020-12-29 ENCOUNTER — Telehealth: Payer: Self-pay | Admitting: Pharmacist

## 2020-12-29 NOTE — Progress Notes (Addendum)
° ° °  Chronic Care Management Pharmacy Assistant   Name: [name] [name]  MRN: [id number] DOB: [birth year]  Reason for Encounter: General Disease State Call   Conditions to be addressed/monitored: Hypertension, Hyperlipidemia, Diabetes, LV Dysfunction/Heart Failure?, Gout   Recent office visits:  12/08/20 Dr. [name]. Labs drawn.STOPPED Hydrochlorothiazide and Ketoconazole.  Recent consult visits:  12/13/20 Oncology [name], MD. For Angioimmunoblastic lymphoma. No medication changes.   Hospital visits:  None in previous 6 months  Medications: Outpatient Encounter Medications as of 12/29/2020  Medication Sig   potassium chloride (KLOR-CON M10) 10 MEQ tablet Take 1 tablet (10 mEq total) by mouth daily.   allopurinol (ZYLOPRIM) 300 MG tablet Take 1 tablet (300 mg total) by mouth daily.   aspirin 81 MG chewable tablet Chew 81 mg by mouth daily.   carvedilol (COREG) 12.5 MG tablet Take 12.5 mg by mouth 2 (two) times daily.   furosemide (LASIX) 40 MG tablet Take 1 tablet (40 mg total) by mouth daily as needed for fluid or edema.   losartan (COZAAR) 50 MG tablet Take 2 tablets (100 mg total) by mouth daily.   metFORMIN (GLUCOPHAGE) 500 MG tablet Take 1 tablet (500 mg total) by mouth daily with breakfast.   Omega-3 Fatty Acids (FISH OIL) 1000 MG CAPS Take 1,000 mg by mouth daily.   rosuvastatin (CRESTOR) 10 MG tablet Take 1 tablet (10 mg total) by mouth daily.   No facility-administered encounter medications on file as of 12/29/2020.   GEN Call: Patient stated he does light lift weighting and walking 3 times a week for about 30 min.  He stated he has stopped eating sweets. He stated he eats a well rounded diet, he stated he eats a lot vegetables. He stated he knows he doesn't drink enough water but mainly drinks milk and orange juice. He stated he is able to get around fairly well he stated he goes shopping and is able to cook and clean all on his own.Patient stated he does not have any questions  or concerns about her medication at this time.  Star Rating Drugs: Losartan 50 mg daily 12/02/20 90 DS , Meformin 500 mg daily 11/01/20 90 DS, Rosuvastatin 10 mg daily 12/20/20 90 DS.  Follow-Up: Pharmacist Review  Charlann Lange, RMA Clinical Pharmacist Assistant [phone]  10 minutes spent in review, coordination, and documentation.  Reviewed by: Beverly Milch, PharmD Clinical Pharmacist [facility] [phone]

## 2021-01-03 ENCOUNTER — Telehealth: Payer: Self-pay | Admitting: Adult Health

## 2021-01-03 ENCOUNTER — Other Ambulatory Visit: Payer: Self-pay | Admitting: Adult Health

## 2021-01-03 DIAGNOSIS — C8440 Peripheral T-cell lymphoma, not classified, unspecified site: Secondary | ICD-10-CM

## 2021-01-03 NOTE — Telephone Encounter (Signed)
I called patient to discuss Evusheld per referral sent by Dr. [name].  LMOM to return my call.  Wilber Bihari, NP

## 2021-01-03 NOTE — Progress Notes (Signed)
I connected by phone with Madaline Savage on 01/03/2021, 2:24 PM to discuss the potential use of a new treatment, tixagevimab/cilgavimab, for pre-exposure prophylaxis for prevention of coronavirus disease 2019 (COVID-19) caused by the SARS-CoV-2 virus.  This patient is a 80 y.o. male that meets the FDA criteria for Emergency Use Authorization of tixagevimab/cilgavimab for pre-exposure prophylaxis of COVID-19 disease. Pt meets following criteria:  Age >12 yr and weight > 40kg  Not currently infected with SARS-CoV-2 and has no known recent exposure to an individual infected with SARS-CoV-2 AND o Who has moderate to severe immune compromise due to a medical condition or receipt of immunosuppressive medications or treatments and may not mount an adequate immune response to COVID-19 vaccination or  o Vaccination with any available COVID-19 vaccine, according to the approved or authorized schedule, is not recommended due to a history of severe adverse reaction (e.g., severe allergic reaction) to a COVID-19 vaccine(s) and/or COVID-19 vaccine component(s).  o Patient meets the following definition of mod-severe immune compromised status: 8. Other groups not previously mentioned: 4. T cell lymphoma, receipt of vaccine while receiving chemotherapy  I have spoken and communicated the following to the patient or parent/caregiver regarding COVID monoclonal antibody treatment:  1. FDA has authorized the emergency use of tixagevimab/cilgavimab for the pre-exposure prophylaxis of COVID-19 in patients with moderate-severe immunocompromised status, who meet above EUA criteria.  2. The significant known and potential risks and benefits of COVID monoclonal antibody, and the extent to which such potential risks and benefits are unknown.  3. Information on available alternative treatments and the risks and benefits of those alternatives, including clinical trials.  4. The patient or parent/caregiver has the option to  accept or refuse COVID monoclonal antibody treatment.  After reviewing this information with the patient, agree to receive tixagevimab/cilgavimab.  Patient will come on 01/05/2021 at 10 am.  Schedule message sent.    Scot Dock, NP, 01/03/2021, 2:24 PM

## 2021-01-05 ENCOUNTER — Other Ambulatory Visit: Payer: Self-pay

## 2021-01-05 ENCOUNTER — Inpatient Hospital Stay: Payer: HMO | Attending: Hematology & Oncology

## 2021-01-05 DIAGNOSIS — C8440 Peripheral T-cell lymphoma, not classified, unspecified site: Secondary | ICD-10-CM | POA: Diagnosis not present

## 2021-01-05 DIAGNOSIS — Z298 Encounter for other specified prophylactic measures: Secondary | ICD-10-CM | POA: Diagnosis not present

## 2021-01-05 MED ORDER — TIXAGEVIMAB (PART OF EVUSHELD) INJECTION
300.0000 mg | Freq: Once | INTRAMUSCULAR | Status: AC
  Administered 2021-01-05: 300 mg via INTRAMUSCULAR
  Filled 2021-01-05: qty 3

## 2021-01-05 MED ORDER — CILGAVIMAB (PART OF EVUSHELD) INJECTION
300.0000 mg | Freq: Once | INTRAMUSCULAR | Status: AC
  Administered 2021-01-05: 300 mg via INTRAMUSCULAR
  Filled 2021-01-05: qty 3

## 2021-01-05 NOTE — Patient Instructions (Signed)

## 2021-01-29 ENCOUNTER — Other Ambulatory Visit: Payer: Self-pay | Admitting: Internal Medicine

## 2021-02-06 ENCOUNTER — Other Ambulatory Visit: Payer: Self-pay | Admitting: Family

## 2021-02-06 DIAGNOSIS — C8440 Peripheral T-cell lymphoma, not classified, unspecified site: Secondary | ICD-10-CM

## 2021-02-07 ENCOUNTER — Other Ambulatory Visit: Payer: Self-pay

## 2021-02-07 ENCOUNTER — Encounter: Payer: Self-pay | Admitting: Internal Medicine

## 2021-02-07 ENCOUNTER — Ambulatory Visit (INDEPENDENT_AMBULATORY_CARE_PROVIDER_SITE_OTHER): Payer: HMO | Admitting: Internal Medicine

## 2021-02-07 VITALS — BP 142/74 | HR 64 | Temp 97.6°F | Resp 16 | Ht 72.0 in | Wt 293.0 lb

## 2021-02-07 DIAGNOSIS — E119 Type 2 diabetes mellitus without complications: Secondary | ICD-10-CM | POA: Diagnosis not present

## 2021-02-07 DIAGNOSIS — E785 Hyperlipidemia, unspecified: Secondary | ICD-10-CM | POA: Diagnosis not present

## 2021-02-07 DIAGNOSIS — E1169 Type 2 diabetes mellitus with other specified complication: Secondary | ICD-10-CM | POA: Diagnosis not present

## 2021-02-07 DIAGNOSIS — G629 Polyneuropathy, unspecified: Secondary | ICD-10-CM

## 2021-02-07 LAB — LIPID PANEL
Cholesterol: 138 mg/dL (ref 0–200)
HDL: 39 mg/dL — ABNORMAL LOW (ref >39.00)
NonHDL: 99.01
Total CHOL/HDL Ratio: 4
Triglycerides: 282 mg/dL — ABNORMAL HIGH (ref 0.0–149.0)
VLDL: 56.4 mg/dL — ABNORMAL HIGH (ref 0.0–40.0)

## 2021-02-07 LAB — LDL CHOLESTEROL, DIRECT: Direct LDL: 76 mg/dL

## 2021-02-07 NOTE — Patient Instructions (Addendum)
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  Continue checking your blood pressures regularly BP GOAL is between 110/65 and  135/85. If it is consistently higher or lower, let me know     GO TO THE LAB : Get the blood work     [place], [place] back for a checkup in 4 to 6 months    Diabetes Mellitus and [place] care is an important part of your health, especially when you have diabetes. Diabetes may cause you to have problems because of poor blood flow (circulation) to your feet and legs, which can cause your skin to:  Become thinner and drier.  Break more easily.  Heal more slowly.  Peel and crack. You may also have nerve damage (neuropathy) in your legs and feet, causing decreased feeling in them. This means that you may not notice minor injuries to your feet that could lead to more serious problems. Noticing and addressing any potential problems early is the best way to prevent future foot problems. How to care for your feet Foot hygiene  Wash your feet daily with warm water and mild soap. Do not use hot water. Then, pat your feet and the areas between your toes until they are completely dry. Do not soak your feet as this can dry your skin.  Trim your toenails straight across. Do not dig under them or around the cuticle. File the edges of your nails with an emery board or nail file.  Apply a moisturizing lotion or petroleum jelly to the skin on your feet and to dry, brittle toenails. Use lotion that does not contain alcohol and is unscented. Do not apply lotion between your toes.   Shoes and socks  Wear clean socks or stockings every day. Make sure they are not too tight. Do not wear knee-high stockings since they may decrease blood flow to your legs.  Wear shoes that fit properly and have enough cushioning. Always look in your shoes before you put them on to be sure there are no objects  inside.  To break in new shoes, wear them for just a few hours a day. This prevents injuries on your feet. Wounds, scrapes, corns, and calluses  Check your feet daily for blisters, cuts, bruises, sores, and redness. If you cannot see the bottom of your feet, use a mirror or ask someone for help.  Do not cut corns or calluses or try to remove them with medicine.  If you find a minor scrape, cut, or break in the skin on your feet, keep it and the skin around it clean and dry. You may clean these areas with mild soap and water. Do not clean the area with peroxide, alcohol, or iodine.  If you have a wound, scrape, corn, or callus on your foot, look at it several times a day to make sure it is healing and not infected. Check for: ? Redness, swelling, or pain. ? Fluid or blood. ? Warmth. ? Pus or a bad smell.   General tips  Do not cross your legs. This may decrease blood flow to your feet.  Do not use heating pads or hot water bottles on your feet. They may burn your skin. If you have lost feeling in your feet or legs, you may not know this is happening until it is too late.  Protect your feet from hot and cold by wearing shoes, such as at the  beach or on hot pavement.  Schedule a complete foot exam at least once a year (annually) or more often if you have foot problems. Report any cuts, sores, or bruises to your health care provider immediately. Where to find more information  American Diabetes Association: www.diabetes.org  Association of Diabetes Care & Education Specialists: www.diabeteseducator.org Contact a health care provider if:  You have a medical condition that increases your risk of infection and you have any cuts, sores, or bruises on your feet.  You have an injury that is not healing.  You have redness on your legs or feet.  You feel burning or tingling in your legs or feet.  You have pain or cramps in your legs and feet.  Your legs or feet are numb.  Your feet  always feel cold.  You have pain around any toenails. Get help right away if:  You have a wound, scrape, corn, or callus on your foot and: ? You have pain, swelling, or redness that gets worse. ? You have fluid or blood coming from the wound, scrape, corn, or callus. ? Your wound, scrape, corn, or callus feels warm to the touch. ? You have pus or a bad smell coming from the wound, scrape, corn, or callus. ? You have a fever. ? You have a red line going up your leg. Summary  Check your feet every day for blisters, cuts, bruises, sores, and redness.  Apply a moisturizing lotion or petroleum jelly to the skin on your feet and to dry, brittle toenails.  Wear shoes that fit properly and have enough cushioning.  If you have foot problems, report any cuts, sores, or bruises to your health care provider immediately.  Schedule a complete foot exam at least once a year (annually) or more often if you have foot problems. This information is not intended to replace advice given to you by your health care provider. Make sure you discuss any questions you have with your health care provider. Document Revised: 04/29/2020 Document Reviewed: 04/29/2020 Elsevier Patient Education  [place].

## 2021-02-07 NOTE — Progress Notes (Signed)
Subjective:    Patient ID: [name] [name], male    DOB: May 13, 1941, 80 y.o.   MRN: [id number]  DOS:  02/07/2021 Type of visit - description: Follow-up Since the last office visit is feeling well. Did report toes numbness on and off. Reports normal ambulatory BPs.   Review of Systems See above   Past Medical History:  Diagnosis Date  . Arthritis   . Complication of anesthesia     had spinal with knee surgeries  -"heart rate too low" for general  . Detached retina, left    L eye normal vision, reports that he has had a retina procedure in a doctor's office at [facility]   . Diabetes mellitus without complication ([place])   . Dysrhythmia    slight irregular rate  . GERD (gastroesophageal reflux disease)    hx no problems now  . H/O echocardiogram 2014  . History of hiatal hernia    ?20 yrs ago  . Hyperopia 2016  . Hypertension   . Lumbar stenosis with neurogenic claudication   . MG, ocular (myasthenia gravis) ([place])    ? of L eye,   . Prediabetes   . Presbyopia OU 2016    Past Surgical History:  Procedure Laterality Date  . COLONOSCOPY    . HERNIA REPAIR     bilateral inguinal hernia  . HERNIA REPAIR     umbilical  . IR IMAGING GUIDED PORT INSERTION  09/16/2019  . IR REMOVAL TUN ACCESS W/ PORT W/O FL MOD SED  03/04/2020  . JOINT REPLACEMENT  2009   right knee  . LUMBAR LAMINECTOMY/DECOMPRESSION MICRODISCECTOMY N/A 06/06/2017   Procedure: Decompression L3-5, insitu fusion L3-5 ;  Surgeon: Melina Schools, MD;  Location: [place];  Service: Orthopedics;  Laterality: N/A;  . LYMPH NODE BIOPSY Right 09/01/2019   Procedure: CERVICAL LYMPH NODE BIOPSY;  Surgeon: [name], MD;  Location: [place];  Service: ENT;  Laterality: Right;  . MIDDLE EAR SURGERY Right    ear -patched hole in ear drum  . RIGHT/LEFT HEART CATH AND CORONARY ANGIOGRAPHY N/A 11/11/2019   Procedure: RIGHT/LEFT HEART CATH AND CORONARY ANGIOGRAPHY;  Surgeon: Jolaine Artist, MD;  Location: [place] [facility];   Service: Cardiovascular;  Laterality: N/A;  . TOTAL KNEE ARTHROPLASTY  09/10/2012   Procedure: TOTAL KNEE ARTHROPLASTY;  Surgeon: Tobi Bastos, MD;  Location: [facility];  Service: Orthopedics;  Laterality: Left;  . TOTAL SHOULDER ARTHROPLASTY Right 06/22/2016   Procedure: RIGHT TOTAL SHOULDER ARTHROPLASTY;  Surgeon: [name], MD;  Location: [place];  Service: Orthopedics;  Laterality: Right;    Allergies as of 02/07/2021      Reactions   Doxycycline Rash      Medication List       Accurate as of February 07, 2021 11:59 PM. If you have any questions, ask your nurse or doctor.        allopurinol 300 MG tablet Commonly known as: ZYLOPRIM TAKE 1 TABLET BY MOUTH EVERY DAY   aspirin 81 MG chewable tablet Chew 81 mg by mouth daily.   carvedilol 12.5 MG tablet Commonly known as: COREG Take 12.5 mg by mouth 2 (two) times daily.   Fish Oil 1000 MG Caps Take 1,000 mg by mouth daily.   furosemide 40 MG tablet Commonly known as: Lasix Take 1 tablet (40 mg total) by mouth daily as needed for fluid or edema.   hydrochlorothiazide 25 MG tablet Commonly known as: HYDRODIURIL Take 25 mg by mouth daily.  losartan 50 MG tablet Commonly known as: COZAAR Take 2 tablets (100 mg total) by mouth daily.   metFORMIN 500 MG tablet Commonly known as: GLUCOPHAGE Take 1 tablet (500 mg total) by mouth daily with breakfast. What changed: when to take this   potassium chloride 10 MEQ tablet Commonly known as: Klor-Con M10 Take 1 tablet (10 mEq total) by mouth daily.   rosuvastatin 10 MG tablet Commonly known as: Crestor Take 1 tablet (10 mg total) by mouth daily.          Objective:   Physical Exam BP (!) 142/74 (BP Location: Left Arm, Patient Position: Sitting, Cuff Size: Normal)   Pulse 64   Temp 97.6 F (36.4 C) (Oral)   Resp 16   Ht 6' (1.829 m)   Wt 293 lb (132.9 kg)   SpO2 98%   BMI 39.74 kg/m  General:   Well developed, NAD, BMI noted. HEENT:  Normocephalic . Face  symmetric, atraumatic Lungs:  CTA B Normal respiratory effort, no intercostal retractions, no accessory muscle use. Heart: RRR,  no murmur.  DM foot exam: Trace peri-ankle edema, good pedal pulses, nails long, pinprick examination: Patchy decrease of feeling distally bilaterally Skin: Not pale. Not jaundice Neurologic:  alert & oriented X3.  Speech normal, gait appropriate for age and unassisted Psych--  Cognition and judgment appear intact.  Cooperative with normal attention span and concentration.  Behavior appropriate. No anxious or depressed appearing.      Assessment     Assessment DM + Neuropathy Dx 01-2021 HTN Hyperlipidemia Gout  Morbid obesity Snoring  CV: -Palpitations, PVCs, h/o bradycardia,s/p. Dr Adin Hector day monitor, echo 2014: Normal LV, some LVH due to HTN - new onset acute syst CHF 10/2019>>  S/p catheterization 11/11/2019: No CAD: Normal EF. 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 Neuropathy:  New diagnosis, + numbness, pinprick examination +, likely from diabetes, feet care discussed, see AVS. unable to care for his toenails, referral to podiatry HTN: Reports normal ambulatory BPs, continue carvedilol, Lasix, HCTZ, losartan, potassium.  Last BMP very good. High cholesterol: On Crestor, check FLP Preventive care: Has POA, recommend to bring the copy.  Ha a [facility] infusion RTC 5 to 6 months   Time spent 23 minutes.  This visit occurred during the SARS-CoV-2 public health emergency.  Safety protocols were in place, including screening questions prior to the visit, additional usage of staff PPE, and extensive cleaning of exam room while observing appropriate contact time as indicated for disinfecting solutions.

## 2021-02-08 NOTE — Assessment & Plan Note (Signed)
DM: On metformin, last A1c is was 6.2. Neuropathy:  New diagnosis, + numbness, pinprick examination +, likely from diabetes, feet care discussed, see AVS. unable to care for his toenails, referral to podiatry HTN: Reports normal ambulatory BPs, continue carvedilol, Lasix, HCTZ, losartan, potassium.  Last BMP very good. High cholesterol: On Crestor, check FLP Preventive care: Has POA, recommend to bring the copy.  Ha a Ev[facility] infusion RTC 5 to 6 months

## 2021-03-07 ENCOUNTER — Ambulatory Visit: Payer: HMO | Admitting: Podiatry

## 2021-03-12 IMAGING — CT CT BIOPSY AND ASPIRATION BONE MARROW
1 series · 15 of 26 positions shown, 19 images · non-contrast
Comparison: none

INDICATION: T-cell lymphoma. Please perform CT-guided bone marrow biopsy for
tissue diagnostic purposes.

[Series 2: i-spiral 5.0 b40f · axial · 0.98mm/px · z∈[-355,-274]mm · 15 of 26 slices shown, 19 images]
[im 2/26  mediastinal]
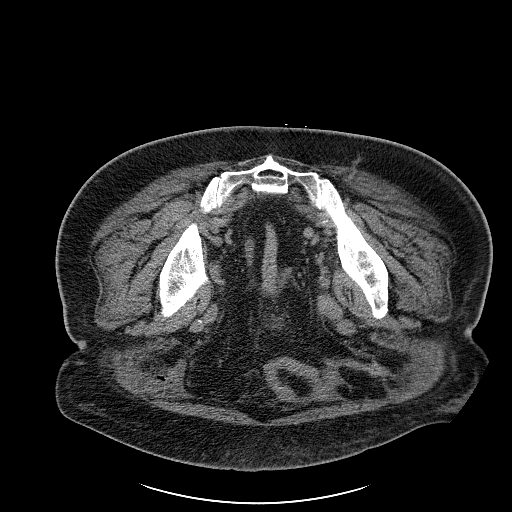
[im 2/26  lung]
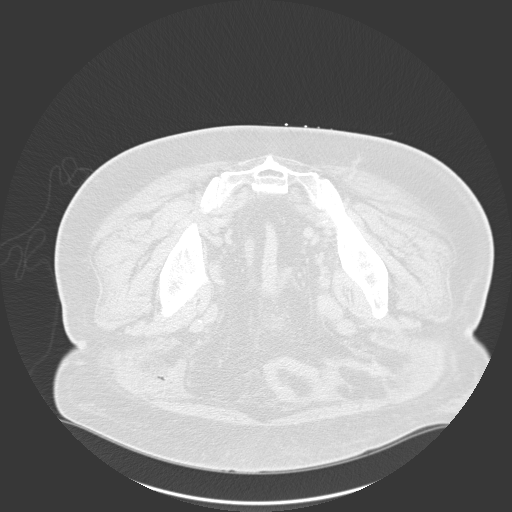
[im 4/26  lung]
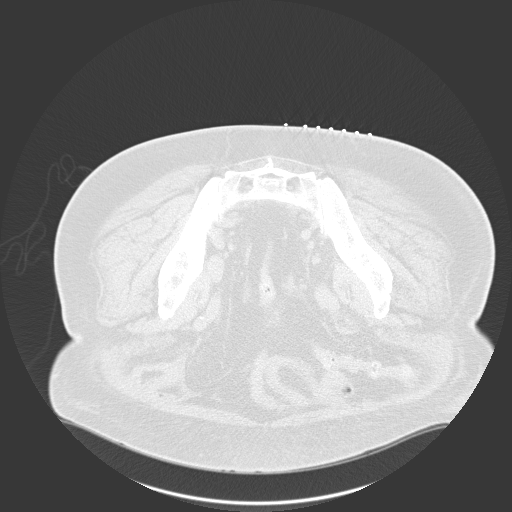
[im 6/26  lung]
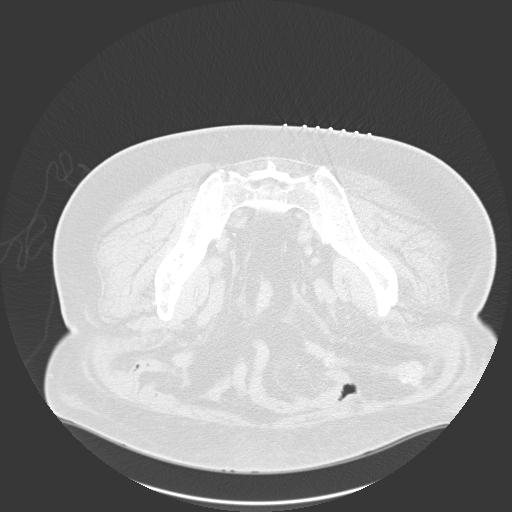
[im 7/26  lung]
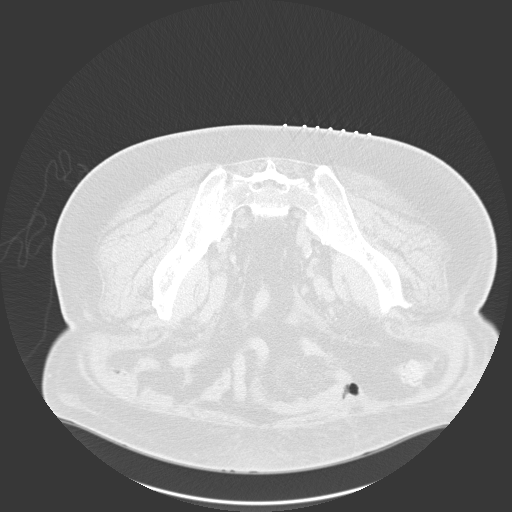
[im 9/26  mediastinal]
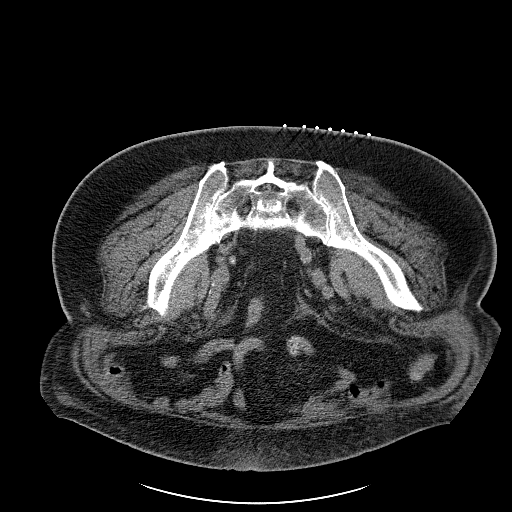
[im 9/26  lung]
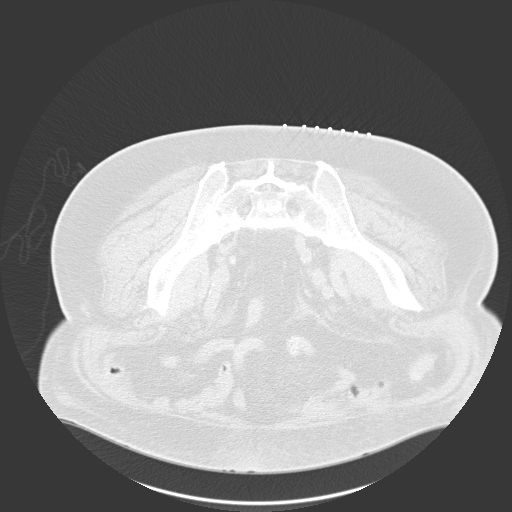
[im 10/26  lung]
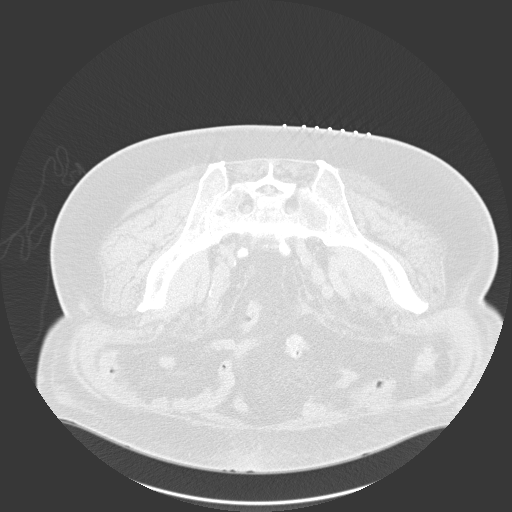
[im 12/26  lung]
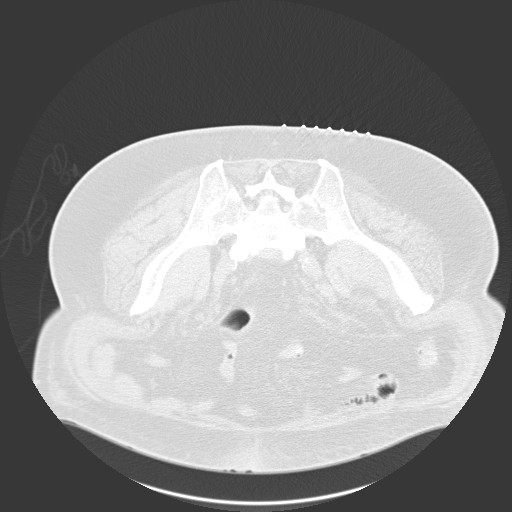
[im 14/26  lung]
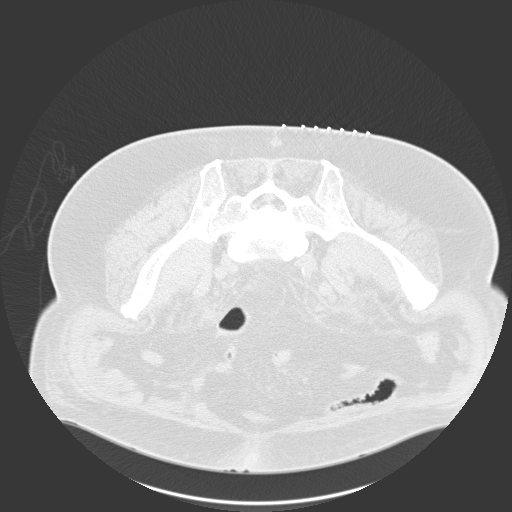
[im 15/26  mediastinal]
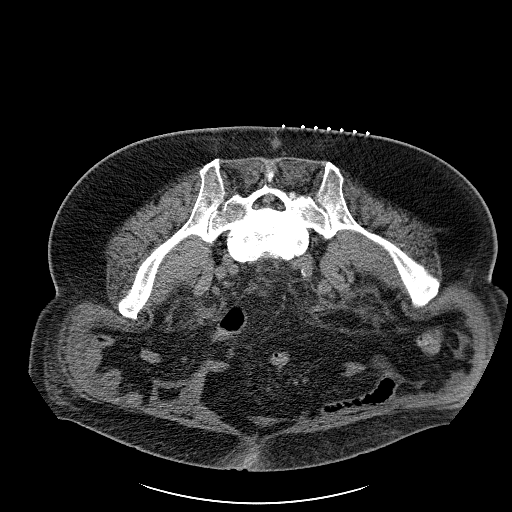
[im 15/26  lung]
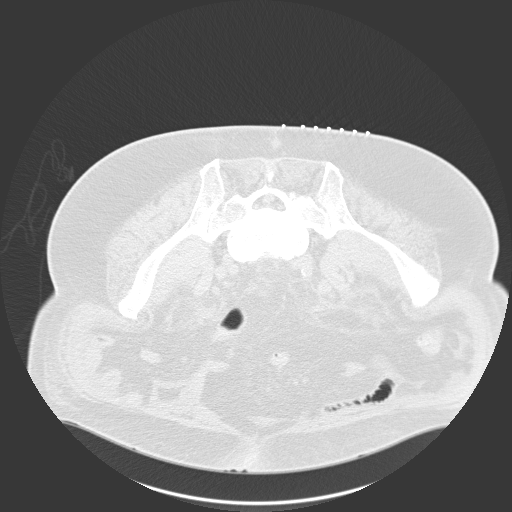
[im 17/26  lung]
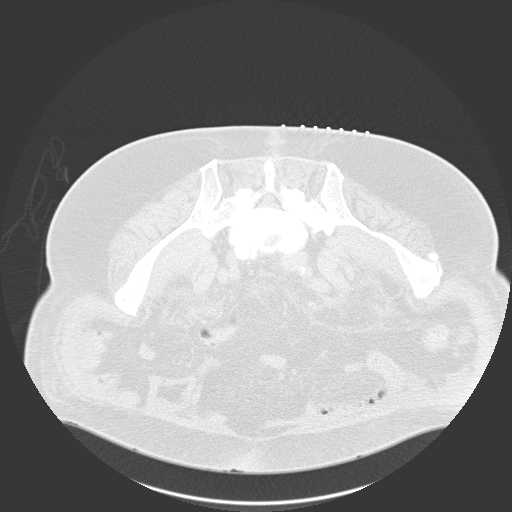
[im 18/26  lung]
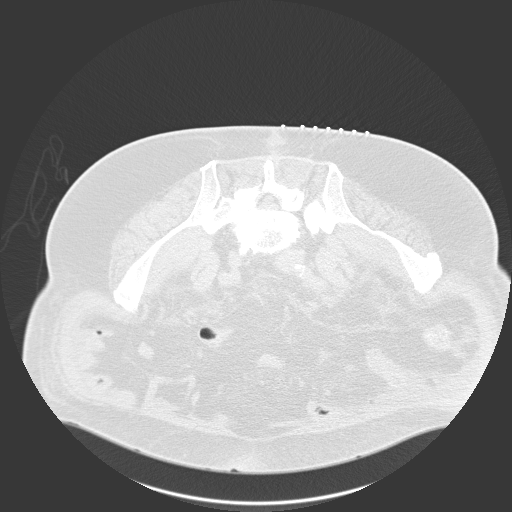
[im 20/26  lung]
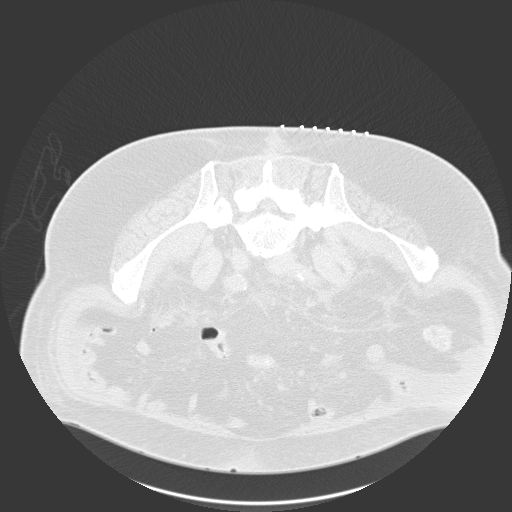
[im 22/26  mediastinal]
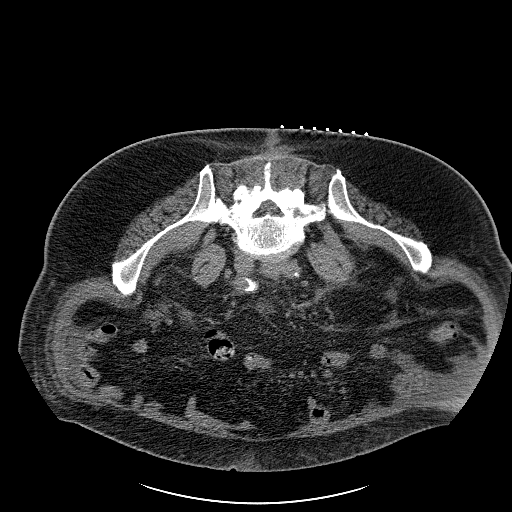
[im 22/26  lung]
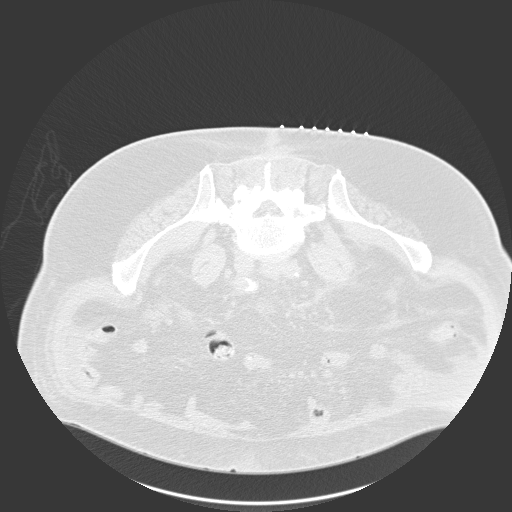
[im 23/26  lung]
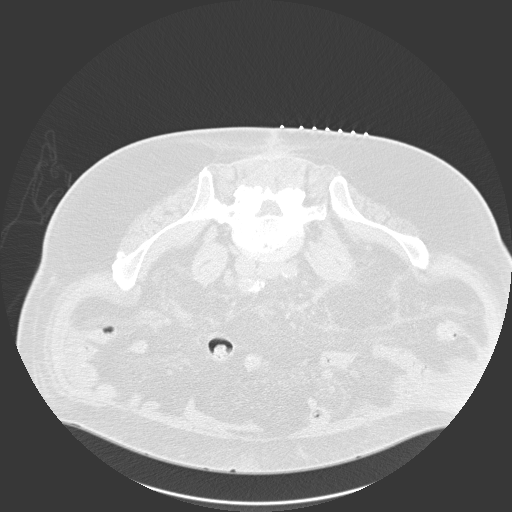
[im 25/26  lung]
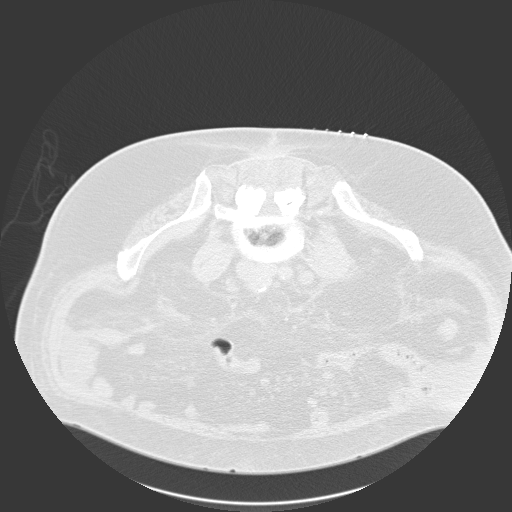

[15 of 26 positions shown; findings below may reference images not displayed]

EXAM:
CT-GUIDED BONE MARROW BIOPSY AND ASPIRATION

MEDICATIONS:
None

ANESTHESIA/SEDATION:
Fentanyl 100 mcg IV; Versed 2 mg IV

Sedation Time: 10 Minutes; The patient was continuously monitored
during the procedure by the interventional radiology nurse under my
direct supervision.

COMPLICATIONS:
None immediate.

PROCEDURE:
Informed consent was obtained from the patient following an
explanation of the procedure, risks, benefits and alternatives. The
patient understands, agrees and consents for the procedure. All
questions were addressed. A time out was performed prior to the
initiation of the procedure. The patient was positioned prone and
non-contrast localization CT was performed of the pelvis to
demonstrate the iliac marrow spaces. The operative site was prepped
and draped in the usual sterile fashion.

Under sterile conditions and local anesthesia, a 22 gauge spinal
needle was utilized for procedural planning. Next, an 11 gauge
coaxial bone biopsy needle was advanced into the left iliac marrow
space. Needle position was confirmed with CT imaging. Initially,
bone marrow aspiration was performed. Next, a bone marrow biopsy was
obtained with the 11 gauge outer bone marrow device. Samples were
prepared with the cytotechnologist and deemed adequate. The needle
was removed intact. Hemostasis was obtained with compression and a
dressing was placed. The patient tolerated the procedure well
without immediate post procedural complication.
IMPRESSION: Successful CT guided left iliac bone marrow aspiration and core
biopsy.

## 2021-03-14 ENCOUNTER — Other Ambulatory Visit: Payer: Self-pay

## 2021-03-14 ENCOUNTER — Ambulatory Visit: Payer: HMO | Admitting: Podiatry

## 2021-03-14 ENCOUNTER — Encounter: Payer: Self-pay | Admitting: Podiatry

## 2021-03-14 DIAGNOSIS — B351 Tinea unguium: Secondary | ICD-10-CM | POA: Diagnosis not present

## 2021-03-14 DIAGNOSIS — M79675 Pain in left toe(s): Secondary | ICD-10-CM

## 2021-03-14 DIAGNOSIS — M79674 Pain in right toe(s): Secondary | ICD-10-CM | POA: Diagnosis not present

## 2021-03-14 DIAGNOSIS — L608 Other nail disorders: Secondary | ICD-10-CM | POA: Insufficient documentation

## 2021-03-14 NOTE — Progress Notes (Signed)
This patient returns to my office for at risk foot care.  This patient requires this care by a professional since this patient will be at risk due to having diabetes type 2. This patient is having problems with his left knee and hip.  This patient is unable to cut nails himself since the patient cannot reach his nails.These nails are painful walking and wearing shoes.  This patient presents for at risk foot care today.  General Appearance  Alert, conversant and in no acute stress.  Vascular  Dorsalis pedis  are palpable  bilaterally. Posterior tibial pulses are absent  B/L. Capillary return is within normal limits  bilaterally. Cold feet  Bilaterally. Absent hair  B/L.  Neurologic  Senn-Weinstein monofilament wire test within normal limits/diminished   bilaterally. Muscle power within normal limits bilaterally.  Nails Thick disfigured discolored nails with subungual debris  Hallux nails. No evidence of bacterial infection or drainage bilaterally.  Orthopedic  No limitations of motion  feet .  No crepitus or effusions noted.  No bony pathology or digital deformities noted.  HAV  B/L.  Skin  normotropic skin with no porokeratosis noted bilaterally.  No signs of infections or ulcers noted.     Onychomycosis  Pain in right toes  Pain in left toes  Consent was obtained for treatment procedures.   Mechanical debridement of nails 1-5  bilaterally performed with a nail nipper.  Filed with dremel without incident. Patient qualifies for diabetic shoes due to DPN and HAV  B/L.   Return office visit     3 months.                Told patient to return for periodic foot care and evaluation due to potential at risk complications.   Gardiner Barefoot DPM

## 2021-03-22 ENCOUNTER — Other Ambulatory Visit: Payer: Self-pay

## 2021-03-22 ENCOUNTER — Ambulatory Visit (INDEPENDENT_AMBULATORY_CARE_PROVIDER_SITE_OTHER): Payer: HMO | Admitting: Podiatry

## 2021-03-22 ENCOUNTER — Encounter: Payer: Self-pay | Admitting: Podiatry

## 2021-03-22 DIAGNOSIS — L608 Other nail disorders: Secondary | ICD-10-CM

## 2021-03-22 DIAGNOSIS — E1169 Type 2 diabetes mellitus with other specified complication: Secondary | ICD-10-CM

## 2021-03-22 NOTE — Progress Notes (Addendum)
Patient presented for foam casting for 3 pair custom diabetic shoe inserts. Patient is measured with a brannock device to be a size 13 x-wide  Diabetic shoes are chosen from the [employer] catalog.  The shoes chosen are 672  The patient will be contacted when the shoes and inserts are ready to be picked up  Gardiner Barefoot DPM

## 2021-04-01 ENCOUNTER — Telehealth: Payer: Self-pay

## 2021-04-01 NOTE — Telephone Encounter (Signed)
Diabetic shoe form completed and faxed back to [place] [facility] [place] at [phone]. Form sent for scanning.

## 2021-04-04 ENCOUNTER — Encounter: Payer: Self-pay | Admitting: Hematology & Oncology

## 2021-04-04 ENCOUNTER — Inpatient Hospital Stay (HOSPITAL_BASED_OUTPATIENT_CLINIC_OR_DEPARTMENT_OTHER): Payer: HMO | Admitting: Hematology & Oncology

## 2021-04-04 ENCOUNTER — Inpatient Hospital Stay: Payer: HMO | Attending: Hematology & Oncology

## 2021-04-04 ENCOUNTER — Other Ambulatory Visit: Payer: Self-pay

## 2021-04-04 ENCOUNTER — Telehealth: Payer: Self-pay

## 2021-04-04 VITALS — BP 130/54 | HR 65 | Temp 98.2°F | Resp 18 | Wt 299.0 lb

## 2021-04-04 DIAGNOSIS — C8440 Peripheral T-cell lymphoma, not classified, unspecified site: Secondary | ICD-10-CM | POA: Insufficient documentation

## 2021-04-04 LAB — CMP (CANCER CENTER ONLY)
ALT: 13 U/L (ref 0–44)
AST: 13 U/L — ABNORMAL LOW (ref 15–41)
Albumin: 4.5 g/dL (ref 3.5–5.0)
Alkaline Phosphatase: 76 U/L (ref 38–126)
Anion gap: 13 (ref 5–15)
BUN: 17 mg/dL (ref 8–23)
CO2: 26 mmol/L (ref 22–32)
Calcium: 9.8 mg/dL (ref 8.9–10.3)
Chloride: 99 mmol/L (ref 98–111)
Creatinine: 1.14 mg/dL (ref 0.61–1.24)
GFR, Estimated: >60 mL/min (ref >60)
Glucose, Bld: 168 mg/dL — ABNORMAL HIGH (ref 70–99)
Potassium: 3.9 mmol/L (ref 3.5–5.1)
Sodium: 138 mmol/L (ref 135–145)
Total Bilirubin: 0.7 mg/dL (ref 0.3–1.2)
Total Protein: 6.6 g/dL (ref 6.5–8.1)

## 2021-04-04 LAB — CBC WITH DIFFERENTIAL (CANCER CENTER ONLY)
Abs Immature Granulocytes: 0.06 10*3/uL (ref 0.00–0.07)
Basophils Absolute: 0 10*3/uL (ref 0.0–0.1)
Basophils Relative: 1 %
Eosinophils Absolute: 0.3 10*3/uL (ref 0.0–0.5)
Eosinophils Relative: 4 %
HCT: 38 % — ABNORMAL LOW (ref 39.0–52.0)
Hemoglobin: 13.1 g/dL (ref 13.0–17.0)
Immature Granulocytes: 1 %
Lymphocytes Relative: 18 %
Lymphs Abs: 1.2 10*3/uL (ref 0.7–4.0)
MCH: 31.1 pg (ref 26.0–34.0)
MCHC: 34.5 g/dL (ref 30.0–36.0)
MCV: 90.3 fL (ref 80.0–100.0)
Monocytes Absolute: 0.6 10*3/uL (ref 0.1–1.0)
Monocytes Relative: 8 %
Neutro Abs: 4.5 10*3/uL (ref 1.7–7.7)
Neutrophils Relative %: 68 %
Platelet Count: 225 10*3/uL (ref 150–400)
RBC: 4.21 MIL/uL — ABNORMAL LOW (ref 4.22–5.81)
RDW: 13.6 % (ref 11.5–15.5)
WBC Count: 6.6 10*3/uL (ref 4.0–10.5)
nRBC: 0 % (ref 0.0–0.2)

## 2021-04-04 LAB — LACTATE DEHYDROGENASE: LDH: 133 U/L (ref 98–192)

## 2021-04-04 NOTE — Progress Notes (Signed)
Hematology and Oncology Follow Up Visit  [name] [name] [id number] 1941-08-01 80 y.o. 04/04/2021   Principle Diagnosis:  Stage IV angioimmunoblastic T-cell NHL  Current Therapy:   S/p CHP + Adcetris x 6 cycles -- completed on 01/19/2020     Interim History:  [name] [name] is back for follow-up.  He is doing pretty well.  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  He really enjoys doing this.  He is about to go up to [place] at a campsite.  He will be there for about 8 days or so.  While he is up there, he will still go around to some churches and play gospel music.    He has been feeling pretty well.  His weight is going up.  His wife is still in the nursing home.  He cooks for himself.  He thinks he makes too much food.  He has had no change in bowel or bladder habits.  He has had no nausea or vomiting.  There has been no cough or shortness of breath.  He has had no bleeding.  He has had no leg swelling.  Overall, his performance status is ECOG 1.    Medications:  Current Outpatient Medications:    allopurinol (ZYLOPRIM) 300 MG tablet, TAKE 1 TABLET BY MOUTH EVERY DAY, Disp: 90 tablet, Rfl: 1   aspirin 81 MG chewable tablet, Chew 81 mg by mouth daily., Disp: , Rfl:    carvedilol (COREG) 12.5 MG tablet, Take 12.5 mg by mouth 2 (two) times daily., Disp: , Rfl:    furosemide (LASIX) 40 MG tablet, Take 1 tablet (40 mg total) by mouth daily as needed for fluid or edema., Disp: 30 tablet, Rfl: 11   hydrochlorothiazide (HYDRODIURIL) 25 MG tablet, Take 25 mg by mouth daily., Disp: , Rfl:    losartan (COZAAR) 50 MG tablet, Take 2 tablets (100 mg total) by mouth daily., Disp: 180 tablet, Rfl: 1   metFORMIN (GLUCOPHAGE) 500 MG tablet, Take 1 tablet (500 mg total) by mouth daily with breakfast. (Patient taking differently: Take 500 mg by mouth 2 (two) times daily with a meal.), Disp: 90 tablet, Rfl: 0   Omega-3 Fatty Acids (FISH OIL) 1000 MG CAPS, Take 1,000 mg by mouth daily., Disp: ,  Rfl:    potassium chloride (KLOR-CON M10) 10 MEQ tablet, Take 1 tablet (10 mEq total) by mouth daily., Disp: 90 tablet, Rfl: 1   rosuvastatin (CRESTOR) 10 MG tablet, Take 1 tablet (10 mg total) by mouth daily., Disp: 90 tablet, Rfl: 3  Allergies:  Allergies  Allergen Reactions   Doxycycline Rash    Past Medical History, Surgical history, Social history, and Family History were reviewed and updated.  Review of Systems: Review of Systems  Constitutional: Negative.   HENT:  Negative.    Eyes: Negative.   Respiratory:  Positive for shortness of breath.   Cardiovascular:  Positive for leg swelling.  Gastrointestinal:  Positive for nausea.  Endocrine: Negative.   Genitourinary: Negative.    Musculoskeletal:  Positive for arthralgias.  Skin: Negative.   Neurological: Negative.   Hematological: Negative.   Psychiatric/Behavioral: Negative.     Physical Exam:  vitals were not taken for this visit.   Wt Readings from Last 3 Encounters:  02/07/21 293 lb (132.9 kg)  12/13/20 295 lb (133.8 kg)  12/08/20 297 lb (134.7 kg)    Physical Exam Vitals reviewed.  HENT:     Head: Normocephalic and atraumatic.  Eyes:  Pupils: Pupils are equal, round, and reactive to light.  Cardiovascular:     Rate and Rhythm: Normal rate and regular rhythm.     Heart sounds: Normal heart sounds.  Pulmonary:     Effort: Pulmonary effort is normal.     Breath sounds: Normal breath sounds.  Abdominal:     General: Bowel sounds are normal.     Palpations: Abdomen is soft.  Musculoskeletal:        General: No tenderness or deformity. Normal range of motion.     Cervical back: Normal range of motion.  Lymphadenopathy:     Cervical: No cervical adenopathy.  Skin:    General: Skin is warm and dry.     Findings: No erythema or rash.  Neurological:     Mental Status: He is alert and oriented to person, place, and time.  Psychiatric:        Behavior: Behavior normal.        Thought Content: Thought  content normal.        Judgment: Judgment normal.     Lab Results  Component Value Date   WBC 6.6 04/04/2021   HGB 13.1 04/04/2021   HCT 38.0 (L) 04/04/2021   MCV 90.3 04/04/2021   PLT 225 04/04/2021     Chemistry      Component Value Date/Time   NA 140 12/13/2020 0949   K 4.2 12/13/2020 0949   CL 102 12/13/2020 0949   CO2 27 12/13/2020 0949   BUN 15 12/13/2020 0949   CREATININE 1.11 12/13/2020 0949   CREATININE 1.41 (H) 06/06/2019 1528      Component Value Date/Time   CALCIUM 9.5 12/13/2020 0949   ALKPHOS 79 12/13/2020 0949   AST 14 (L) 12/13/2020 0949   ALT 13 12/13/2020 0949   BILITOT 0.6 12/13/2020 0949      Impression and Plan: [name] [name] is a very nice 80 year old white male.  He had stage IV T-cell lymphoma.  This was an angioimmunoblastic lymphoma.  He was treated with chemotherapy and targeted therapy.  He completed 6 hours of treatment back on 01/19/2020.  He went into remission.  He is still in remission.  I do not see where have to do any scans on him right now.  His lab work looks fine.  Because he is doing quite well, we will try to move his appointments out to 6 months.  I think this would be a reasonable interval.   [name], MD 6/13/20229:29 AM

## 2021-04-12 ENCOUNTER — Telehealth: Payer: HMO

## 2021-04-13 ENCOUNTER — Telehealth: Payer: Self-pay | Admitting: Pharmacist

## 2021-04-13 ENCOUNTER — Other Ambulatory Visit (HOSPITAL_COMMUNITY): Payer: Self-pay | Admitting: Internal Medicine

## 2021-04-13 ENCOUNTER — Ambulatory Visit (INDEPENDENT_AMBULATORY_CARE_PROVIDER_SITE_OTHER): Payer: HMO | Admitting: Pharmacist

## 2021-04-13 ENCOUNTER — Encounter (HOSPITAL_COMMUNITY): Payer: Self-pay | Admitting: *Deleted

## 2021-04-13 DIAGNOSIS — I152 Hypertension secondary to endocrine disorders: Secondary | ICD-10-CM | POA: Diagnosis not present

## 2021-04-13 DIAGNOSIS — E1159 Type 2 diabetes mellitus with other circulatory complications: Secondary | ICD-10-CM

## 2021-04-13 DIAGNOSIS — E1169 Type 2 diabetes mellitus with other specified complication: Secondary | ICD-10-CM

## 2021-04-13 DIAGNOSIS — G629 Polyneuropathy, unspecified: Secondary | ICD-10-CM

## 2021-04-13 DIAGNOSIS — E785 Hyperlipidemia, unspecified: Secondary | ICD-10-CM

## 2021-04-13 DIAGNOSIS — M7989 Other specified soft tissue disorders: Secondary | ICD-10-CM

## 2021-04-13 MED ORDER — CARVEDILOL 12.5 MG PO TABS: 12.5000 mg | ORAL_TABLET | Freq: Two times a day (BID) | ORAL | 1 refills | Status: DC

## 2021-04-13 NOTE — Telephone Encounter (Signed)
During CCM phone visit patient reported [facility] was not able to fill his carvedilol or rosuvastatin this month. Has been out of carvedilol for 1 week.  Needs update [facility] to [facility] rosu[facility]atin 10mg  daily and carvedilol 12.5mg  twice a day

## 2021-04-13 NOTE — Chronic Care Management (AMB) (Signed)
Chronic Care Management Pharmacy Note  04/13/2021 Name:  [name] [name] MRN:  [id number] DOB:  [birth year]  Summary: Noted low adherence to carvedilol and rosuvastatin. Patient reports he has been having difficulty getting refills from pharmacy.  Last OV with Dr [name] patient reported he was not taking HCTZ but today he read all his prescription bottle to me and he has been taking HCTZ 14m daily as well as furosemide 412mdaily.  Last BP in office was elevated.   Recommendations/Changes made from today's visit: [facility]inating [facility] rosuvastatin and carvedilol Coordinating with PCP regarding continued use of HCTZ.   Plan: Will continue to follow adherence over the next 3 to 6 months with interventions as needed.   Subjective: [name] [name] an [age].o. year old male who is a primary patient of [name] [name], [name] BertholdMD.  The CCM team was consulted for assistance with disease management and care coordination needs.    Engaged with patient by telephone for follow up visit in response to provider referral for pharmacy case management and/or care coordination services.   Consent to Services:  The patient was given information about Chronic Care Management services, agreed to services, and gave verbal consent prior to initiation of services.  Please see initial visit note for detailed documentation.   Patient Care Team: PaColon BranchMD as PCP - General [name], PhWonda ChengMD as PCP - Cardiology (Cardiology) BuRonald LoboMD as Consulting Physician (Gastroenterology) SuJustice BritainMD as Consulting Physician (Orthopedic Surgery) RaSuella BroadMD as Consulting Physician (Physical Medicine and Rehabilitation) WoJodi MarbleMD as Consulting Physician (Otolaryngology) EnVolanda NapoleonMD as Medical Oncologist (Oncology) GoMelissa NoonOD as Referring Physician (Optometry) EcCherre RobinsPharmD (Pharmacist)  Recent office visits: 02/07/2021 - PCP (Dr PaLarose KellsF/U  visit; New diagnosis of neuropathy. Referred to podiatry.  12/08/2020 - PCP (Dr PaLarose Kellsf/u visit; HTN: Currently on losartan 50 mg 2 tablets daily, carvedilol 12.5 mg bid, Lasix and potassium.  Apparently not on HCTZ.  Reason? BP today 153/70.  At home 147/80.  Will not restart HCTZ monitor BPs, consider increase carvedilol.  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 03/22/2021 - Podiatry (Dr MaPrudence DavidsonCasting for diabetic shoes 03/14/2021 - Podiatry (Dr MaPrudence Davidsonpain due to onchomycosis. Performed mechanical debridement of nails; no med changes.   Hospital visits: None in previous 6 months  Objective:  Lab Results  Component Value Date   CREATININE 1.14 04/04/2021   CREATININE 1.11 12/13/2020   CREATININE 1.01 12/08/2020    Lab Results  Component Value Date   HGBA1C 6.2 12/08/2020   Last diabetic Eye exam:  Lab Results  Component Value Date/Time   HMDIABEYEEXA No Retinopathy 11/30/2020 12:00 AM    Last diabetic Foot exam:  Lab Results  Component Value Date/Time   HMDIABFOOTEX Done 06/12/2019 12:00 AM        Component Value Date/Time   CHOL 138 02/07/2021 1056   TRIG 282.0 (H) 02/07/2021 1056   TRIG 247 (HH) 08/30/2006 1117   HDL 39.00 (L) 02/07/2021 1056   CHOLHDL 4 02/07/2021 1056   VLDL 56.4 (H) 02/07/2021 1056   LDLCALC 39 10/14/2019 0526   LDLDIRECT 76.0 02/07/2021 1056    Hepatic Function Latest Ref Rng & Units 04/04/2021 12/13/2020 12/08/2020  Total Protein 6.5 - 8.1 g/dL 6.6 6.9 6.7  Albumin 3.5 - 5.0 g/dL 4.5 4.4 4.2  AST 15 - 41 U/L 13(L) 14(L) 13  ALT 0 - 44 U/L 13 13 14   Alk Phosphatase 38 - 126 U/L 76 79 77  Total Bilirubin 0.3 - 1.2 mg/dL 0.7 0.6 0.5  Bilirubin, Direct 0.0 - 0.3 mg/dL - - -    Lab Results  Component Value Date/Time   TSH 2.07 09/23/2019 09:55 AM   TSH 0.71 10/21/2018 02:01 PM    CBC Latest Ref Rng &  Units 04/04/2021 12/13/2020 09/09/2020  WBC 4.0 - 10.5 K/uL 6.6 7.5 8.4  Hemoglobin 13.0 - 17.0 g/dL 13.1 12.8(L) 13.0  Hematocrit 39.0 - 52.0 % 38.0(L) 37.5(L) 37.6(L)  Platelets 150 - 400 K/uL 225 229 274    No results found for: VD25OH  Clinical ASCVD: No  The ASCVD Risk score Mikey Bussing DC Jr., et al., 2013) failed to calculate for the following reasons:   The patient has a prior MI or stroke diagnosis     Social History   Tobacco Use  Smoking Status Former   Packs/day: 0.50   Years: 2.00   Pack years: 1.00   Types: Cigarettes   Quit date: 06/09/1966   Years since quitting: 54.8  Smokeless Tobacco Never   BP Readings from Last 3 Encounters:  04/04/21 (!) 130/54  02/07/21 (!) 142/74  01/05/21 (!) 119/56   Pulse Readings from Last 3 Encounters:  04/04/21 65  02/07/21 64  01/05/21 63   Wt Readings from Last 3 Encounters:  04/04/21 299 lb (135.6 kg)  02/07/21 293 lb (132.9 kg)  12/13/20 295 lb (133.8 kg)    Assessment: Review of patient past medical history, allergies, medications, health status, including review of consultants reports, laboratory and other test data, was performed as part of comprehensive evaluation and provision of chronic care management services.   SDOH:  (Social Determinants of Health) assessments and interventions performed:  SDOH Interventions    Flowsheet Row Most Recent Value  SDOH Interventions   Financial Strain Interventions Intervention Not Indicated  Physical Activity Interventions Other (Comments)  [not exercising due to hip pain,  has f/u with ortho]       CCM Care Plan  Allergies  Allergen Reactions   Doxycycline Rash    Medications Reviewed Today     Reviewed by [name], PharmD (Pharmacist) on 04/13/21 at [phone]  Med List Status: <None>   Medication Order Taking? Sig Documenting Provider Last Dose Status Informant  allopurinol (ZYLOPRIM) 300 MG tablet [id number] Yes TAKE 1 TABLET BY MOUTH EVERY DAY [name], MD  Taking Active   aspirin 81 MG chewable tablet [id number] Yes Chew 81 mg by mouth daily. [provider] Taking Active   carvedilol (COREG) 12.5 MG tablet [id number] No Take 12.5 mg by mouth 2 (two) times daily.  Patient not taking: Reported on 04/13/2021   [provider] Not Taking Active   diphenhydramine-acetaminophen (TYLENOL PM) 25-500 MG TABS tablet [id number] Yes Take 1 tablet by mouth at bedtime as needed. [provider] Taking Active   furosemide (LASIX) 40 MG tablet [id number] Yes Take 1 tablet (40 mg total) by mouth daily as needed for fluid or edema. Kathie Dike, MD Taking Active Self  hydrochlorothiazide (HYDRODIURIL) 25 MG tablet [id number] Yes Take 25 mg by mouth daily. [provider] Taking Active   losartan (COZAAR) 50 MG tablet [id number] Yes Take 2 tablets (100 mg total) by mouth daily. Colon Branch, MD Taking Active   metFORMIN (GLUCOPHAGE)  500 MG tablet [id number] Yes Take 1 tablet (500 mg total) by mouth daily with breakfast.  Patient taking differently: Take 500 mg by mouth 2 (two) times daily with a meal.   Colon Branch, MD Taking Active   Omega-3 Fatty Acids (FISH OIL) 1000 MG CAPS [id number] Yes Take 1,000 mg by mouth daily. [provider] Taking Active Self           Med Note (HILL, TIFFANY A   Fri May 28, 2020 10:20 AM)    potassium chloride (KLOR-CON M10) 10 MEQ tablet [id number] Yes Take 1 tablet (10 mEq total) by mouth daily. Colon Branch, MD Taking Active   rosuvastatin (CRESTOR) 10 MG tablet [id number] No Take 1 tablet (10 mg total) by mouth daily.  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   Stasis dermatitis of both legs 06/21/2019   Diabetes mellitus, type II ([place]) 07/09/2017   Status post lumbar surgery 06/06/2017   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/22/2016   PVC's (premature ventricular contractions) 11/24/2015   PCP NOTES >>>>>>>>>>>>>>>>>>>>>> 06/23/2015   Morbid obesity ([place]) 02/24/2015   Bradycardia 11/19/2013   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   Palpitations 04/19/2013   Annual physical exam 12/07/2011   R calf larger than L, chronic, U/S (-) for DVT 11-2011 12/07/2011   Osteoarthritis 01/30/2008   Hyperlipidemia associated with type 2 diabetes mellitus ([place]) 12/19/2007   Hypertension associated with diabetes ([place]) 12/19/2007   ERECTILE DYSFUNCTION 01/28/2007   GERD 01/28/2007    Immunization History  Administered Date(s) Administered   Fluad Quad(high Dose 65+) 06/20/2019, 10/04/2020   H1N1 11/12/2008   Influenza Split 07/17/2014, 07/31/2018   Influenza Whole 08/04/2010   Influenza,inj,Quad PF,6+ Mos 06/23/2015   Influenza-Unspecified 07/23/2013, 07/07/2016, 07/20/2017   Moderna Sars-Covid-2 Vaccination 03/16/2020, 04/14/2020   Pneumococcal Conjugate-13 08/12/2014   Pneumococcal Polysaccharide-23 11/12/2008, 05/28/2019   Td 12/19/2007   Tdap 04/04/2018   Zoster Recombinat (Shingrix) 12/20/2018, 05/12/2019   Zoster, Live 10/24/2011    Conditions to be addressed/monitored: HTN, HLD, DMII, and gout / OA; T-Cell lymphoma angioimmunoblastic (in remission) LV dysfunction vs. CHF  Care Plan : General Pharmacy (Adult)  Updates made by [name], PHARMD since 04/13/2021 12:00 AM     Problem: Pharmacy Care Plan - Medication and Chronic Care Management   Priority: High  Onset Date: 04/13/2021     Long-Range Goal: Long Range goals of CCM and medication  management   Start Date: 04/13/2021  This Visit's Progress: Not on track  Priority: High  Note:   Current Barriers:  Unable to maintain control of HTN Does not adhere to prescribed medication regimen Does not maintain contact with provider office  Pharmacist Clinical Goal(s):  Over the next 90 days, patient will achieve control of HTN as evidenced by BP < 140/90 maintain control of hyperlipidemia and CHF as evidenced by maintaining LDL <100 and take medications as prescribed  adhere to prescribed medication regimen as evidenced by refill  history and patient self report  through collaboration with PharmD and provider.   Interventions: 1:1 collaboration with Colon Branch, MD regarding development and update of comprehensive plan of care as evidenced by provider attestation and co-signature Inter-disciplinary care team collaboration (see longitudinal plan of care) Comprehensive medication review performed; medication list updated in electronic medical record   Hypertension  BP goal <140/90 Recent office BPs have been variable Patient is not checking BP at home Denies symptoms of hypotension - no dizziness or lightheadedness Current regimen:  hydrochlorothiazide 69m daily (patient had reported to Dr PLarose Kellsat appointment in March 2022 that he was not taking HCTZ but patient read names of all current prescription bottle he has been taking and HCTZ was one of them) Losartan 576m- take 2 tablets = 1007maily Carvedilol 12.5mg32mice a day Interventions: Discussed blood pressure goal Coordinated refill for carvedilol to patient's preferred pharmacy Coordinated with PCP regarding HCTZ. Since patient is tolerating regimen with both HCTZ and furosemide will continue.  Maintain hypertension medication regimen.  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 Has not been able to get refill from his pharmacy. Current regimen:  Carvedilol 12.5mg t57me a day  Furosemide 40mg d63m as needed for swelling in legs Potassium chloride 10 mEq - take 1 tablet daily Losartan 50mg - 80m 2 tablets = 100mg dai58mnterventions: Coordinated refill for carvedilol to patient's preferred pharmacy Patient was due to follow up with Heart Failure clinic in March 2022 to recheck ECHO. Left message on nurses line requesting review of patient's chart and if still needed to scheduled ECHO.  Maintain current  regimen.  Limit sodium intake to < 2300mg per 65m Hyperlipidemia Controlled; LDL goal < 100, achieve Triglyceride goal < 150  Has been out of reosuvastatin for 1 week - last filled for 90 day supply on 12/20/2020 ([facility]  Current regimen:  Rosuvastatin 10mg daily62mh Oil 1000mg daily 83mrventions: Discussed LDL goal Discussed limiting fried foods Coordinated refill for rosuvastatin to patient's preferred pharmacy Maintain cholesterol medication regimen.   Diabetes Controlled; A1c goal <7% Current regimen:  Metformin 500mg twice a73m Interventions: Discussed A1c goal (addressed at previous visits)  Health Maintenance  Interventions: Discussed covid booster vaccine  Medication management Decreased adherence due to difficulty getting refills on prescriptions [facility] Interventions Comprehensive medication review performed. Continue current medication management strategy Coordinated with pharmacy and PCP to get refills for needed meds; Patient to call me if he continues to have issues.  Report any questions or concerns to PharmD and/or provider(s)  Patient Goals/Self-Care Activities Over the next 90 days, patient will:  take medications as prescribed, collaborate with provider  on medication access solutions, and engage in dietary modifications by limiting intake of sodium to <2300mg per day 9mlow Up Plan: Telephone follow up appointment with care management team member scheduled for:  3 months  with pharmacy CMA to check adherence and 6 months with clinical pharmacist      Medication Assistance: None required.  Patient affirms current coverage meets needs.  [id number] [id number] [facility] [address]  [id number] Phone: [phone] Fax: [phone]   Follow Up:  Patient agrees to Care Plan and Follow-up.  Plan: Telephone follow up appointment with care management team member scheduled for:  3 months with pharmacy CMA to check adherence and 6 months with clinical pharmacist  [name], PharmD Clinical Pharmacist [facility] [phone]

## 2021-04-13 NOTE — Telephone Encounter (Signed)
[facility] - [facility] will [facility] Dr Sung Amabile. Looks like patient was due to see him around March 2022 for repeat ECHO per Dr Bensihmon's last note but no appt made. LM on VM at Heart Failure clinic for chart review regarding recheck ECHO and to reach out to patient to make appt.

## 2021-04-13 NOTE — Telephone Encounter (Signed)
I have refilled the carvedilol for him, but it looks like Dr. [name] at cardiology is in charge of the rosuvastatin- he will need to reach out to their office for refill.

## 2021-04-13 NOTE — Patient Instructions (Signed)
Visit Information Mr. Kimoto,  It was a pleasure speaking with you today. Please feel free to contact me if you have any questions or concerns. Below is information regarding your health goals.  I have contacted the cardiologist office regarding refills for rosuvastatin and need for a follow up ECHO (heart test) Dr [name] [facility].   Keep up the good work!  [name], PharmD Clinical Pharmacist [facility] [address] [place] [facility] [phone]  PATIENT GOALS:  Goals Addressed             This Visit's Progress    Chronic Care Management Pharmacy Care Plan   Not on track    [place] (see longitudinal plan of care for additional care plan information)  Current Barriers:  Chronic Disease Management support, education, and care coordination needs related to Hypertension, Hyperlipidemia, Diabetes, LV Dysfunction/Heart Failure, Gout   Hypertension / Heart / swelling: BP Readings from Last 3 Encounters:  04/04/21 (!) 130/54  02/07/21 (!) 142/74  01/05/21 (!) 119/56  Pharmacist Clinical Goal(s): Over the next 180 days, patient will work with PharmD and providers to maintain BP goal <140/90 and minimize edema and other symptoms of heart failure Current regimen:  hydrochlorothiazide 25mg  daily  Losartan 50mg  - take 2 tablets = 100mg  daily Carvedilol 12.5mg  twice a day Furosemide 40mg  daily as needed for swelling in legs Potassium chloride 10 mEq - take 1 tablet daily Interventions: Discussed blood pressure goal Coordinated refill for carvedilol to patient's preferred pharmacy Patient self care activities - Over the next 180 days, patient will: Maintain hypertension medication regimen.  Limit sodium intake to < 2300mg  per day [facility]  Hyperlipidemia Lab Results  Component Value Date/Time   LDLCALC 39 10/14/2019 05:26 AM   LDLDIRECT 76.0 02/07/2021 10:56 AM  Pharmacist Clinical Goal(s): Over the next 180  days, patient will work with PharmD and providers to maintain LDL goal < 100, achieve Triglyceride goal < 150  Current regimen:  Rosuvastatin 10mg  daily Fish Oil 1000mg  daily Interventions: Discussed LDL goal Discussed limiting fried foods Coordinated refill for rosuvastatin to patient's preferred pharmacy Patient self care activities - Over the next 180 days, patient will: Maintain cholesterol medication regimen.  [facility]  Diabetes Lab Results  Component Value Date/Time   HGBA1C 6.2 12/08/2020 10:17 AM   HGBA1C 6.1 06/03/2020 10:15 AM  Pharmacist Clinical Goal(s): Over the next 180 days, patient will work with PharmD and providers to maintain A1c goal <7% Current regimen:  Metformin 500mg  twice a day Interventions: Discussed A1c goal Patient self care activities - Over the next 180 days, patient will: Maintain a1c less than 7%  Health Maintenance  Pharmacist Clinical Goal(s) Over the next 90 days, patient will work with PharmD and providers to complete health maintenance screenings/vaccinations Interventions: Discussed covid booster vaccine Patient self care activities - Over the next 90 days, patient will: Receive covid booster vaccine  Medication management Pharmacist Clinical Goal(s): Over the next 180 days, patient will work with PharmD and providers to achieve optimal medication adherence [facility] Inter[facility] Comprehensive medication review performed. Continue current medication management strategy Patient self care activities - Over the next 180 days, patient will: Focus on medication adherence by filling and taking medications appropriately  Take medications as prescribed Report any questions or concerns to PharmD and/or provider(s)  Please see past updates related to this goal by clicking on the "Past Updates" button in the  selected goal           The patient verbalized understanding of instructions, educational  materials, and care plan provided today and agreed to receive a mailed copy of patient instructions, educational materials, and care plan.   Telephone follow up appointment with care management team member scheduled for: 3 months

## 2021-04-18 ENCOUNTER — Telehealth: Payer: Self-pay | Admitting: Podiatry

## 2021-04-18 ENCOUNTER — Telehealth: Payer: Self-pay | Admitting: Pharmacist

## 2021-04-18 NOTE — Chronic Care Management (AMB) (Signed)
[facility] and was informed patient last filled rosuvastatin 04/13/21 30 DS and last filled carvedilol 04/13/21 90 DS  [facility] Clinical Pharmacist Assistant [phone]

## 2021-04-18 NOTE — Telephone Encounter (Signed)
Received fax from [employer] for [place] # [id number] for diabetic shoes/inserts(A5500 [id number] X6) valid 6.25.2022 thru 9.23.2022.

## 2021-04-18 NOTE — Telephone Encounter (Signed)
-----   Message from [name], PharmD sent at 04/13/2021  3:11 PM EDT ----- Will you check to see if patient has filled rosuvastatin and carvedilol on Monday 04/18/21? We have been having difficulty getting refills for him from cardio.

## 2021-04-28 ENCOUNTER — Other Ambulatory Visit: Payer: Self-pay | Admitting: Internal Medicine

## 2021-05-05 ENCOUNTER — Encounter: Payer: Self-pay | Admitting: Podiatry

## 2021-05-26 ENCOUNTER — Telehealth: Payer: Self-pay | Admitting: Podiatry

## 2021-05-26 NOTE — Telephone Encounter (Signed)
Diabetic shoes/inserts in..lvm for pt to call to schedule an appt to pick them up. 

## 2021-05-29 ENCOUNTER — Other Ambulatory Visit: Payer: Self-pay | Admitting: Internal Medicine

## 2021-05-31 ENCOUNTER — Telehealth: Payer: Self-pay | Admitting: Podiatry

## 2021-05-31 NOTE — Telephone Encounter (Signed)
Pt left message asking when he could come pick up the diabetic shoes.  I returned call and left message my next available currently is 8.11 in the afternoon. I asked him to call me back to get scheduled.

## 2021-06-02 ENCOUNTER — Other Ambulatory Visit: Payer: Self-pay

## 2021-06-02 ENCOUNTER — Ambulatory Visit (INDEPENDENT_AMBULATORY_CARE_PROVIDER_SITE_OTHER): Payer: HMO

## 2021-06-02 DIAGNOSIS — M2011 Hallux valgus (acquired), right foot: Secondary | ICD-10-CM | POA: Diagnosis not present

## 2021-06-02 DIAGNOSIS — M2012 Hallux valgus (acquired), left foot: Secondary | ICD-10-CM | POA: Diagnosis not present

## 2021-06-02 DIAGNOSIS — E1140 Type 2 diabetes mellitus with diabetic neuropathy, unspecified: Secondary | ICD-10-CM | POA: Diagnosis not present

## 2021-06-02 DIAGNOSIS — E1169 Type 2 diabetes mellitus with other specified complication: Secondary | ICD-10-CM

## 2021-06-02 NOTE — Progress Notes (Signed)
Patient in office to pick-up diabetic shoes and custom inserts today. Patient tried the shoes on with inserts and was satisfied with the fit and and feel. Patient was educated on the break-in process and return policy at this time. Patient verbalized understanding by using the teach back method. Advised patient to contact the office with any questions, comments, or concerns.

## 2021-06-15 ENCOUNTER — Ambulatory Visit: Payer: HMO | Admitting: Podiatry

## 2021-06-19 ENCOUNTER — Other Ambulatory Visit: Payer: Self-pay | Admitting: Internal Medicine

## 2021-06-21 ENCOUNTER — Other Ambulatory Visit: Payer: Self-pay

## 2021-06-21 ENCOUNTER — Ambulatory Visit: Payer: HMO | Admitting: Podiatry

## 2021-06-21 ENCOUNTER — Encounter: Payer: Self-pay | Admitting: Podiatry

## 2021-06-21 DIAGNOSIS — E1169 Type 2 diabetes mellitus with other specified complication: Secondary | ICD-10-CM

## 2021-06-21 DIAGNOSIS — L608 Other nail disorders: Secondary | ICD-10-CM

## 2021-06-21 DIAGNOSIS — M79674 Pain in right toe(s): Secondary | ICD-10-CM | POA: Diagnosis not present

## 2021-06-21 DIAGNOSIS — M79675 Pain in left toe(s): Secondary | ICD-10-CM | POA: Diagnosis not present

## 2021-06-21 DIAGNOSIS — B351 Tinea unguium: Secondary | ICD-10-CM | POA: Diagnosis not present

## 2021-06-21 NOTE — Progress Notes (Signed)
This patient returns to my office for at risk foot care.  This patient requires this care by a professional since this patient will be at risk due to having diabetes type 2. This patient is having problems with his left knee and hip.  This patient is unable to cut nails himself since the patient cannot reach his nails.These nails are painful walking and wearing shoes.  This patient presents for at risk foot care today.  General Appearance  Alert, conversant and in no acute stress.  Vascular  Dorsalis pedis  are palpable  bilaterally. Posterior tibial pulses are absent  B/L. Capillary return is within normal limits  bilaterally. Cold feet  Bilaterally. Absent hair  B/L.  Neurologic  Senn-Weinstein monofilament wire test within normal limits/diminished   bilaterally. Muscle power within normal limits bilaterally.  Nails Thick disfigured discolored nails with subungual debris  Hallux nails. No evidence of bacterial infection or drainage bilaterally.  Orthopedic  No limitations of motion  feet .  No crepitus or effusions noted.  No bony pathology or digital deformities noted.  HAV  B/L.  Skin  normotropic skin with no porokeratosis noted bilaterally.  No signs of infections or ulcers noted.     Onychomycosis  Pain in right toes  Pain in left toes  Consent was obtained for treatment procedures.   Mechanical debridement of nails 1-5  bilaterally performed with a nail nipper.  Filed with dremel without incident.    Return office visit     3 months.                Told patient to return for periodic foot care and evaluation due to potential at risk complications.   Kota Ciancio DPM  

## 2021-07-07 DIAGNOSIS — L821 Other seborrheic keratosis: Secondary | ICD-10-CM | POA: Diagnosis not present

## 2021-07-07 DIAGNOSIS — L570 Actinic keratosis: Secondary | ICD-10-CM | POA: Diagnosis not present

## 2021-07-07 DIAGNOSIS — L578 Other skin changes due to chronic exposure to nonionizing radiation: Secondary | ICD-10-CM | POA: Diagnosis not present

## 2021-07-08 ENCOUNTER — Ambulatory Visit (INDEPENDENT_AMBULATORY_CARE_PROVIDER_SITE_OTHER): Payer: HMO | Admitting: Internal Medicine

## 2021-07-08 ENCOUNTER — Encounter: Payer: Self-pay | Admitting: Internal Medicine

## 2021-07-08 ENCOUNTER — Other Ambulatory Visit: Payer: Self-pay

## 2021-07-08 VITALS — BP 152/70 | HR 64 | Temp 98.0°F | Resp 18 | Ht 72.0 in | Wt 302.2 lb

## 2021-07-08 DIAGNOSIS — E1169 Type 2 diabetes mellitus with other specified complication: Secondary | ICD-10-CM | POA: Diagnosis not present

## 2021-07-08 DIAGNOSIS — E1159 Type 2 diabetes mellitus with other circulatory complications: Secondary | ICD-10-CM | POA: Diagnosis not present

## 2021-07-08 DIAGNOSIS — I152 Hypertension secondary to endocrine disorders: Secondary | ICD-10-CM | POA: Diagnosis not present

## 2021-07-08 MED ORDER — FUROSEMIDE 40 MG PO TABS: 40.0000 mg | ORAL_TABLET | Freq: Every day | ORAL | 1 refills | Status: DC | PRN

## 2021-07-08 NOTE — Progress Notes (Signed)
Subjective:    Patient ID: [name] [name], male    DOB: 08-01-1941, 80 y.o.   MRN: [id number]  DOS:  07/08/2021 Type of visit - description: f/u  Since the last office visit, he ran out of Lasix, wonders if he needs to go back on it. Still having hip pain. Occasionally gets dizzy when he stands up really quick.  Symptoms last few seconds. Denies chest pain or difficulty breathing. No edema.  Review of Systems See above   Past Medical History:  Diagnosis Date   Arthritis    Complication of anesthesia     had spinal with knee surgeries  -"heart rate too low" for general   Detached retina, left    L eye normal vision, reports that he has had a retina procedure in a doctor's office at [facility]    Diabetes mellitus without complication ([place])    Dysrhythmia    slight irregular rate   GERD (gastroesophageal reflux disease)    hx no problems now   H/O echocardiogram 2014   History of hiatal hernia    ?20 yrs ago   Hyperopia 2016   Hypertension    Lumbar stenosis with neurogenic claudication    MG, ocular (myasthenia gravis) ([place])    ? of L eye,    Prediabetes    Presbyopia OU 2016    Past Surgical History:  Procedure Laterality Date   COLONOSCOPY     HERNIA REPAIR     bilateral inguinal hernia   HERNIA REPAIR     umbilical   IR IMAGING GUIDED PORT INSERTION  09/16/2019   IR REMOVAL TUN ACCESS W/ PORT W/O FL MOD SED  03/04/2020   JOINT REPLACEMENT  2009   right knee   LUMBAR LAMINECTOMY/DECOMPRESSION MICRODISCECTOMY N/A 06/06/2017   Procedure: Decompression L3-5, insitu fusion L3-5 ;  Surgeon: Melina Schools, MD;  Location: [place];  Service: Orthopedics;  Laterality: N/A;   LYMPH NODE BIOPSY Right 09/01/2019   Procedure: CERVICAL LYMPH NODE BIOPSY;  Surgeon: [name], MD;  Location: [place];  Service: ENT;  Laterality: Right;   MIDDLE EAR SURGERY Right    ear -patched hole in ear drum   RIGHT/LEFT HEART CATH AND CORONARY ANGIOGRAPHY N/A 11/11/2019   Procedure: RIGHT/LEFT  HEART CATH AND CORONARY ANGIOGRAPHY;  Surgeon: Jolaine Artist, MD;  Location: [facility];  Service: Cardiovascular;  Laterality: N/A;   TOTAL KNEE ARTHROPLASTY  09/10/2012   Procedure: TOTAL KNEE ARTHROPLASTY;  Surgeon: Tobi Bastos, MD;  Location: [facility];  Service: Orthopedics;  Laterality: Left;   TOTAL SHOULDER ARTHROPLASTY Right 06/22/2016   Procedure: RIGHT TOTAL SHOULDER ARTHROPLASTY;  Surgeon: [name], MD;  Location: [place];  Service: Orthopedics;  Laterality: Right;    Allergies as of 07/08/2021       Reactions   Doxycycline Rash        Medication List        Accurate as of July 08, 2021 10:40 AM. If you have any questions, ask your nurse or doctor.          allopurinol 300 MG tablet Commonly known as: ZYLOPRIM TAKE 1 TABLET BY MOUTH EVERY DAY   aspirin 81 MG chewable tablet Chew 81 mg by mouth daily.   carvedilol 12.5 MG tablet Commonly known as: COREG Take 1 tablet (12.5 mg total) by mouth 2 (two) times daily.   diphenhydramine-acetaminophen 25-500 MG Tabs tablet Commonly known as: TYLENOL PM Take 1 tablet by mouth at bedtime  as needed.   Fish Oil 1000 MG Caps Take 1,000 mg by mouth daily.   furosemide 40 MG tablet Commonly known as: Lasix Take 1 tablet (40 mg total) by mouth daily as needed for fluid or edema.   hydrochlorothiazide 25 MG tablet Commonly known as: HYDRODIURIL Take 25 mg by mouth daily.   Klor-Con M10 10 MEQ tablet Generic drug: potassium chloride TAKE 1 TABLET BY MOUTH EVERY DAY   losartan 50 MG tablet Commonly known as: COZAAR TAKE 2 TABLETS BY MOUTH EVERY DAY   metFORMIN 500 MG tablet Commonly known as: GLUCOPHAGE Take 1 tablet (500 mg total) by mouth daily with breakfast.   rosuvastatin 10 MG tablet Commonly known as: CRESTOR TAKE 1 TABLET BY MOUTH EVERY DAY           Objective:   Physical Exam BP (!) 152/70 (BP Location: Left Arm, Patient Position: Sitting, Cuff Size: Normal)   Pulse 64    Temp 98 F (36.7 C) (Oral)   Resp 18   Ht 6' (1.829 m)   Wt (!) 302 lb 4 oz (137.1 kg)   SpO2 96%   BMI 40.99 kg/m  General:   Well developed, NAD, BMI noted. HEENT:  Normocephalic . Face symmetric, atraumatic Lungs:  CTA B Normal respiratory effort, no intercostal retractions, no accessory muscle use. Heart: RRR,  no murmur.  Lower extremities: Trace pretibial edema bilaterally  Skin: Not pale. Not jaundice Neurologic:  alert & oriented X3.  Speech normal, gait assisted by a cane. Psych--  Cognition and judgment appear intact.  Cooperative with normal attention span and concentration.  Behavior appropriate. No anxious or depressed appearing.      Assessment    Assessment DM + Neuropathy Dx 01-2021 HTN Hyperlipidemia Gout  Morbid obesity Snoring  CV: -Palpitations, PVCs, h/o bradycardia,s/p. Dr Adin Hector day monitor, echo 2014: Normal LV, some LVH  - New onset acute syst CHF 10/2019>>  S/p catheterization 11/11/2019: No CAD: Normal EF. 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 HTN: On losartan, hydrochlorothiazide, KCl.  Ran out of Lasix 3 months ago.  BP upon arrival 152/70, rechecked by me manually: 160/80. Although he reports "good BPs" at home I will recommend to go back on Lasix and check a BMP in 2 to 3 weeks Neuropathy: Saw podiatry for nail care. 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 RTC labs 2 weeks RTC CPX 11/2021  This visit occurred during the SARS-CoV-2 public health emergency.  Safety protocols were in place, including screening questions prior to the visit, additional usage of staff PPE,  and extensive cleaning of exam room while observing appropriate contact time as indicated for disinfecting solutions.

## 2021-07-08 NOTE — Patient Instructions (Addendum)
Vaccines I recommend this fall: COVID booster Flu shot  Okay to go back on furosemide daily, we are sending a prescription  Check the  blood pressure 2 or 3 times a week BP GOAL is between 110/65 and  135/85. If it is consistently higher or lower, let me know     [place], [place] back for   a blood work in 2 to 3 weeks  Come back for physical exam by 11-2021

## 2021-07-09 NOTE — Assessment & Plan Note (Signed)
DM: On metformin, check A1c. HTN: On losartan, hydrochlorothiazide, KCl.  Ran out of Lasix 3 months ago.  BP upon arrival 152/70, rechecked by me manually: 160/80. Although he reports "good BPs" at home I will recommend to go back on Lasix and check a BMP in 2 to 3 weeks Neuropathy: Saw podiatry for nail care. 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 RTC labs 2 weeks RTC CPX 11/2021

## 2021-07-12 ENCOUNTER — Ambulatory Visit: Payer: HMO | Admitting: Internal Medicine

## 2021-07-25 ENCOUNTER — Other Ambulatory Visit (HOSPITAL_COMMUNITY): Payer: Self-pay | Admitting: Internal Medicine

## 2021-07-25 ENCOUNTER — Ambulatory Visit (INDEPENDENT_AMBULATORY_CARE_PROVIDER_SITE_OTHER): Payer: HMO | Admitting: Pharmacist

## 2021-07-25 DIAGNOSIS — I152 Hypertension secondary to endocrine disorders: Secondary | ICD-10-CM

## 2021-07-25 DIAGNOSIS — I5020 Unspecified systolic (congestive) heart failure: Secondary | ICD-10-CM

## 2021-07-25 DIAGNOSIS — E1169 Type 2 diabetes mellitus with other specified complication: Secondary | ICD-10-CM

## 2021-07-25 DIAGNOSIS — E1159 Type 2 diabetes mellitus with other circulatory complications: Secondary | ICD-10-CM

## 2021-07-25 DIAGNOSIS — E785 Hyperlipidemia, unspecified: Secondary | ICD-10-CM

## 2021-07-25 NOTE — Patient Instructions (Addendum)
Mr. Romer,  It was a pleasure speaking with you today.  I have attached a summary of our visit today and information about your health goals.  Also below is a list of your medications, reason for taking and best time take them.  If you have any questions or concerns, please feel free to contact me either at the phone number below or with a MyChart message.   Make an appointment with Dr Alvan Dame to see about hip pain - [phone] Keep up the good work!  [name], PharmD Clinical Pharmacist [facility] [phone] (direct line)  [phone] (main office number)   Morning Medications (take with breakfast):  Allopurinol 100mg  (to prevent gout) - take 1 tablet Aspirin 81mg  daily (stroke prevention)- take 1 tablet Carvedilol 12.5mg  (for heart and to lower blood pressure)  - take 1 tablet Furosemide 40mg  (for heart, swelling and to lower blood pressure) - take 1 tablet  Hydrochlorothiazide 25mg  (for selling and to lower blood pressure) - take 1 tablet Losartan 50mg  (for heart and blood pressure) - take 2 tablets Potassium chloride 10 mEq - take 1 tablet Metformin 500mg  (for blood glucose) - take 1 tablet  Fish Oil (omega 3) (for heart and triglycerides)  - take 1 capsule Rosuvastatin 10mg  (for heart and cholesterol) - take 1 tablet   Evening Medications (take with supper):  Carvedilol 12.5mg  - take 1 tablet  Bedtime Medications:  Diphenhydramine - acetaminophen (Tylenol PM) - take 1 at night if needed for pain and sleep  Visit Information  PATIENT GOALS:  Goals Addressed             This Visit's Progress    Chronic Care Management Pharmacy Care Plan   Not on track    [place] (see longitudinal plan of care for additional care plan information)  Current Barriers:  Chronic Disease Management support, education, and care coordination needs related to Hypertension, Hyperlipidemia, Diabetes, LV Dysfunction/Heart Failure, Gout    Hypertension / Heart / swelling: BP Readings from Last 3 Encounters:  07/08/21 (!) 152/70  04/04/21 (!) 130/54  02/07/21 (!) 142/74  Pharmacist Clinical Goal(s): Over the next 180 days, patient will work with PharmD and providers to achieve BP goal <140/90 and minimize edema and other symptoms of heart failure Current regimen:  hydrochlorothiazide 25mg  daily  Losartan 50mg  - take 2 tablets = 100mg  daily Carvedilol 12.5mg  twice a day Furosemide 40mg  daily as needed for swelling in legs Potassium chloride 10 mEq - take 1 tablet daily Interventions: Discussed blood pressure goal Coordinated refill for carvedilol to patient's preferred pharmacy Patient self care activities - Over the next 180 days, patient will: Maintain hypertension medication regimen.  It looks like Dr [name] recommended rechecking your heart with an ECHO test in March 2022 (9 months after last appointment in June 2021). I have called their office and they set up an appointment for follow up with Dr [name] for September 27, 2021 at 2:20pm Limit sodium intake to < 2300mg  per day Pick up carvedilol prescription at [facility].  Hyperlipidemia Lab Results  Component Value Date/Time   LDLCALC 39 10/14/2019 05:26 AM   LDLDIRECT 76.0 02/07/2021 10:56 AM  Pharmacist Clinical Goal(s): Over the next 180 days, patient will work with PharmD and providers to maintain LDL goal < 100, achieve Triglyceride goal < 150  Current regimen:  Rosuvastatin 10mg  daily Fish Oil 1000mg  daily Interventions: Discussed LDL goal Discussed limiting fried foods Coordinated refill for rosuvastatin to patient's preferred  pharmacy Patient self care activities - Over the next 180 days, patient will: Maintain cholesterol medication regimen.  [facility] at [facility].  Diabetes Lab Results  Component Value Date/Time   HGBA1C 6.2 12/08/2020 10:17 AM   HGBA1C 6.1 06/03/2020 10:15 AM  Pharmacist Clinical Goal(s): Over the next 180 days, patient  will work with PharmD and providers to maintain A1c goal <7% Current regimen:  Metformin 500mg  twice a day Interventions: Discussed A1c goal Patient self care activities - Over the next 180 days, patient will: Maintain a1c less than 7%  Health Maintenance  Pharmacist Clinical Goal(s) Over the next 90 days, patient will work with PharmD and providers to complete health maintenance screenings/vaccinations Interventions: Discussed covid booster and annual flu vaccine Patient self care activities - Over the next 90 days, patient will: Receive covid booster and annual flu vaccine  Medication management Pharmacist Clinical Goal(s): Over the next 180 days, patient will work with PharmD and providers to achieve optimal medication adherence Current pharmacy: [facility] Interventions Comprehensive medication review performed. Continue current medication management strategy Created list of medications and recommended time of day to take them.  Provided patient with day and night pill container Patient self care activities - Over the next 180 days, patient will: Focus on medication adherence by filling and taking medications appropriately  Take medications as prescribed Report any questions or concerns to PharmD and/or provider(s)  Please see past updates related to this goal by clicking on the "Past Updates" button in the selected goal

## 2021-07-26 NOTE — Chronic Care Management (AMB) (Signed)
Chronic Care Management Pharmacy Note  07/26/2021 Name:  [name] [name] MRN:  [id number] DOB:  11-01-1940  Summary: Noted low adherence to carvedilol and rosuvastatin. Patient reports he has been having difficulty getting refills from pharmacy.  Patient in need of follow up with cardiologist for repeat ECHO Discussed needed vaccines and labs ordered.   Recommendations/Changes made from today's visit: Coordinating with PCP staff and [facility] for rosuvastatin and carvedilol Coordinating with cardiologist to get appointment with Dr [name] for 09/27/2021 Made patient med list for morning and evenings. Also provided pill container to help with adherence.  Made patient appt for labs - 07/28/2021 - will also get flu and COVID booster that day.  Plan: Will continue to follow adherence over the next 3 to 6 months with interventions as needed.   Subjective: [name] is an 80 y.o. year old male who is a primary patient of [name], [name], MD.  The CCM team was consulted for assistance with disease management and care coordination needs.    Engaged with patient by telephone for follow up visit in response to provider referral for pharmacy case management and/or care coordination services.   Consent to Services:  The patient was given information about Chronic Care Management services, agreed to services, and gave verbal consent prior to initiation of services.  Please see initial visit note for detailed documentation.   Patient Care Team: Colon Branch, MD as PCP - General [name], [name], MD as PCP - Cardiology (Cardiology) [name], MD as Consulting Physician (Gastroenterology) [name], MD as Consulting Physician (Orthopedic Surgery) [name], MD as Consulting Physician (Physical Medicine and Rehabilitation) [name], MD as Consulting Physician (Otolaryngology) [name], MD as Medical Oncologist (Oncology) [name], OD as Referring Physician  (Optometry) [name], PharmD (Pharmacist) [name], [name], MD as Consulting Physician (Cardiology)  Recent office visits: 07/08/2021 - PCP (Dr [name]) f/u HTN and diabetes. Labs ordered Patient has been out of furosemide and was not sure he needed to restart. Dr [name] recommended restart furosemide 4m daily and to recheck BMP in 2 to 3 weeks.  02/07/2021 - PCP (Dr PLarose Kells F/U visit; New diagnosis of neuropathy. Referred to podiatry.  12/08/2020 - PCP (Dr PLarose Kells f/u visit; HTN: Currently on losartan 50 mg 2 tablets daily, carvedilol 12.5 mg bid, Lasix and potassium.  Apparently not on HCTZ.  Reason? BP today 153/70.  At home 147/80.  Will not restart HCTZ monitor BPs, consider increase carvedilol.  Recent consult visits: 06/22/19 Podiatry (Dr MPrudence Davidson Mechanical debridement of toe nails. Recheck 3 months.  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 03/22/2021 - Podiatry (Dr MPrudence Davidson Casting for diabetic shoes 03/14/2021 - Podiatry (Dr MPrudence Davidson pain due to onchomycosis. Performed mechanical debridement of nails; no med changes.   Hospital visits: None in previous 6 months  Objective:  Lab Results  Component Value Date   CREATININE 1.14 04/04/2021   CREATININE 1.11 12/13/2020   CREATININE 1.01 12/08/2020    Lab Results  Component Value Date   HGBA1C 6.2 12/08/2020   Last diabetic Eye exam:  Lab Results  Component Value Date/Time   HMDIABEYEEXA No Retinopathy 11/30/2020 12:00 AM    Last diabetic Foot exam:  Lab Results  Component Value Date/Time   HMDIABFOOTEX Done 06/12/2019 12:00 AM  Component Value Date/Time   CHOL 138 02/07/2021 1056   TRIG 282.0 (H) 02/07/2021 1056   TRIG 247 (HH) 08/30/2006 1117   HDL 39.00 (L) 02/07/2021 1056   CHOLHDL 4 02/07/2021 1056   VLDL 56.4 (H) 02/07/2021 1056    LDLCALC 39 10/14/2019 0526   LDLDIRECT 76.0 02/07/2021 1056    Hepatic Function Latest Ref Rng & Units 04/04/2021 12/13/2020 12/08/2020  Total Protein 6.5 - 8.1 g/dL 6.6 6.9 6.7  Albumin 3.5 - 5.0 g/dL 4.5 4.4 4.2  AST 15 - 41 U/L 13(L) 14(L) 13  ALT 0 - 44 U/L _0 Alk Phosphatase 38 - 126 U/L 76 79 77  Total Bilirubin 0.3 - 1.2 mg/dL 0.7 0.6 0.5  Bilirubin, Direct 0.0 - 0.3 mg/dL - - -    Lab Results  Component Value Date/Time   TSH 2.07 09/23/2019 09:55 AM   TSH 0.71 10/21/2018 02:01 PM    CBC Latest Ref Rng & Units 04/04/2021 12/13/2020 09/09/2020  WBC 4.0 - 10.5 K/uL 6.6 7.5 8.4  Hemoglobin 13.0 - 17.0 g/dL 13.1 12.8(L) 13.0  Hematocrit 39.0 - 52.0 % 38.0(L) 37.5(L) 37.6(L)  Platelets 150 - 400 K/uL 225 229 274    No results found for: VD25OH  Clinical ASCVD: No  The ASCVD Risk score (Arnett DK, et al., 2019) failed to calculate for the following reasons:   The patient has a prior MI or stroke diagnosis     Social History   Tobacco Use  Smoking Status Former   Packs/day: 0.50   Years: 2.00   Pack years: 1.00   Types: Cigarettes   Quit date: 06/09/1966   Years since quitting: 55.1  Smokeless Tobacco Never   BP Readings from Last 3 Encounters:  07/08/21 (!) 152/70  04/04/21 (!) 130/54  02/07/21 (!) 142/74   Pulse Readings from Last 3 Encounters:  07/08/21 64  04/04/21 65  02/07/21 64   Wt Readings from Last 3 Encounters:  07/08/21 (!) 302 lb 4 oz (137.1 kg)  04/04/21 299 lb (135.6 kg)  02/07/21 293 lb (132.9 kg)    Assessment: Review of patient past medical history, allergies, medications, health status, including review of consultants reports, laboratory and other test data, was performed as part of comprehensive evaluation and provision of chronic care management services.   SDOH:  (Social Determinants of Health) assessments and interventions performed:  SDOH Interventions    Flowsheet Row Most Recent Value  SDOH Interventions   Food Insecurity  Interventions Intervention Not Indicated  Financial Strain Interventions Intervention Not Indicated  Transportation Interventions Intervention Not Indicated       CCM Care Plan  Allergies  Allergen Reactions   Doxycycline Rash    Medications Reviewed Today     Reviewed by [name], PharmD (Pharmacist) on 07/26/21 at 1308  Med List Status: <None>   Medication Order Taking? Sig Documenting Provider Last Dose Status Informant  allopurinol (ZYLOPRIM) 300 MG tablet [id number] Yes TAKE 1 TABLET BY MOUTH EVERY DAY [name], MD Taking Active   aspirin 81 MG chewable tablet [id number] Yes Chew 81 mg by mouth daily. [provider] Taking Active   carvedilol (COREG) 12.5 MG tablet [id number] Yes Take 1 tablet (12.5 mg total) by mouth 2 (two) times daily. Colon Branch, MD Taking Active   diphenhydramine-acetaminophen (TYLENOL PM) 25-500 MG TABS tablet [id number] Yes Take 1 tablet by mouth at bedtime as needed. [provider] Taking Active   furosemide (LASIX) 40 MG tablet  [id number] Yes Take 1 tablet (40 mg total) by mouth daily as needed for fluid or edema. Colon Branch, MD Taking Active   hydrochlorothiazide (HYDRODIURIL) 25 MG tablet [id number] Yes Take 25 mg by mouth daily. [provider] Taking Active   KLOR-CON M10 10 MEQ tablet [id number] Yes TAKE 1 TABLET BY MOUTH EVERY DAY Colon Branch, MD Taking Active   losartan (COZAAR) 50 MG tablet [id number] Yes TAKE 2 TABLETS BY MOUTH EVERY DAY Colon Branch, MD Taking Active   metFORMIN (GLUCOPHAGE) 500 MG tablet [id number] Yes Take 1 tablet (500 mg total) by mouth daily with breakfast. Colon Branch, MD Taking Active   Omega-3 Fatty Acids (FISH OIL) 1000 MG CAPS [id number] Yes Take 1,000 mg by mouth daily. [provider] Taking Active Self           Med Note (HILL, TIFFANY A   Fri May 28, 2020 10:20 AM)    rosuvastatin (CRESTOR) 10 MG tablet [id number] No TAKE 1 TABLET BY MOUTH EVERY DAY  Patient not taking:  Reported on 07/26/2021   Jolaine Artist, MD Not Taking Active             Patient Active Problem List   Diagnosis Date Noted   Pain due to onychomycosis of toenails of both feet 03/14/2021   Pincer nail deformity 49/82/6415   Systolic heart failure ([place]) 11/07/2019   Hyponatremia 11/07/2019   Acute CHF (congestive heart failure) ([place]) 10/09/2019   Elevated troponin 10/09/2019   Antineoplastic chemotherapy induced pancytopenia ([place]) 10/09/2019   Anemia due to antineoplastic chemotherapy 10/06/2019   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   Stasis dermatitis of both legs 06/21/2019   Diabetes mellitus, type II ([place]) 07/09/2017   Status post lumbar surgery 06/06/2017   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/22/2016   PVC's (premature ventricular contractions) 11/24/2015   PCP NOTES >>>>>>>>>>>>>>>>>>>>>> 06/23/2015   Morbid obesity ([place]) 02/24/2015   Bradycardia 11/19/2013   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   Palpitations 04/19/2013   Annual physical exam 12/07/2011   R calf larger than L, chronic, U/S (-) for DVT 11-2011 12/07/2011   Osteoarthritis 01/30/2008   Hyperlipidemia associated with type 2 diabetes mellitus ([place]) 12/19/2007   Hypertension associated with diabetes ([place]) 12/19/2007   ERECTILE DYSFUNCTION 01/28/2007   GERD 01/28/2007    Immunization History  Administered Date(s) Administered   Fluad Quad(high Dose 65+) 06/20/2019, 10/04/2020   H1N1 11/12/2008   Influenza Split 07/17/2014, 07/31/2018   Influenza Whole 08/04/2010   Influenza,inj,Quad PF,6+ Mos 06/23/2015   Influenza-Unspecified 07/23/2013, 07/07/2016, 07/20/2017   Moderna Sars-Covid-2 Vaccination 03/16/2020, 04/14/2020   Pneumococcal Conjugate-13 08/12/2014   Pneumococcal Polysaccharide-23 11/12/2008, 05/28/2019   Td 12/19/2007   Tdap 04/04/2018   Zoster Recombinat (Shingrix) 12/20/2018, 05/12/2019   Zoster, Live 10/24/2011    Conditions to be addressed/monitored: HTN, HLD, DMII, and gout /  OA; T-Cell lymphoma angioimmunoblastic (in remission) LV dysfunction vs. CHF  Care Plan : General Pharmacy (Adult)  Updates made by [name], PHARMD since 07/26/2021 12:00 AM     Problem: Pharmacy Care Plan - Medication and Chronic Care Management   Priority: High  Onset Date: 04/13/2021     Long-Range Goal: Long Range goals of CCM and medication management   Start Date: 04/13/2021  Recent Progress: Not on track  Priority: High  Note:   Current Barriers:  Unable to maintain control of HTN Does not adhere to prescribed medication regimen Does not maintain contact with provider office  Pharmacist Clinical Goal(s):  Over the next 90 days, patient will achieve control of HTN as evidenced by BP < 140/90 maintain control of hyperlipidemia and CHF as evidenced by maintaining LDL <100 and take medications as prescribed  adhere to prescribed medication regimen as evidenced by refill history and patient self report  through collaboration with PharmD and provider.   Interventions: 1:1 collaboration with Colon Branch, MD regarding development and update of comprehensive plan of care as evidenced by provider attestation and co-signature Inter-disciplinary care team collaboration (see longitudinal plan of care) Comprehensive medication review performed; medication list updated in electronic medical record   Hypertension  BP goal <140/90 Recent office BPs have been variable Patient is checking BP at home, reports 140 - 150 / 70's Reports dizziness once when he took 2 carvedilol 12.68m tablets together in the morning Current regimen:  hydrochlorothiazide 247mdaily  Losartan 5076m take 2 tablets = 100m63mily Carvedilol 12.5mg 68mce a day Furosemide 40mg 47my (restarted 07/08/2021) Interventions: Discussed blood pressure goal Coordinated refill for hydrochlorothiazide to patient's preferred pharmacy Reviewed directions for taking carvedilol - should be twice a day. Advised him not to  take 2 tablets once a day as this might lead to low blood pressure in morning and high blood pressure later in the day. 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 Has not been able to get refill from his pharmacy. Current regimen:  Carvedilol 12.5mg twi56ma day  Furosemide 40mg dai39ms needed for swelling in legs Potassium chloride 10 mEq - take 1 tablet daily Losartan 50mg - ta65m tablets = 100mg daily73merventions: Coordinated refill for carvedilol to patient's preferred pharmacy Patient was due to follow up with Heart Failure clinic in March 2022 to recheck ECHO. Left message on nurses line requesting review of patient's chart and if still needed to scheduled ECHO.  Maintain current  regimen.  Limit sodium intake to < 2300mg per da76myperlipidemia Controlled; LDL goal < 100, achieve Triglyceride goal < 150  Has been out of reosuvastatin for several weeks - last filled for 30 day supply on 04/13/2021 (initially prescribed by Dr Bensihmon duSung Amabile been seen by his office in over a year. No current appointment) ) Current regimen:  Rosuvastatin 10mg daily F76mOil 1000mg daily In76mentions: Discussed LDL goal Discussed limiting fried foods Will reach out to cardiologist and PCP about refilling rosuvastatin (last filled #30 04/13/2021) Assisted patient in making appointment for cardio / [facility] 09/27/2021  Diabetes Controlled; A1c goal <7% Current regimen:  Metformin 500mg twice a d85mnterventions: Discussed A1c goal; Will get A1c checked 07/27/2021  Health  Maintenance  Interventions: Discussed covid booster vaccine Discussed annual flu vaccine  Medication management Decreased adherence due to difficulty getting refills on prescriptions [facility] Interventions Comprehensive medication review performed. Continue current medication management strategy Provided weekly pill planned / container for patient to use to help increase adherence. Left at front desk for patient to pick up 07/27/2021 Coordinated with pharmacy and PCP to get refills for needed medications - metformin, hydrochlorothiazide and rosuvastatin Left message with cardiologist  office (Dr Jeffie Pollock) to scheduled follow up visit.  Report any questions or concerns to PharmD and/or provider(s)  Patient Goals/Self-Care Activities Over the next 90 days, patient will:  take medications as prescribed, collaborate with provider on medication access solutions, and engage in dietary modifications by limiting intake of sodium to <2371m per day  Follow Up Plan: Telephone follow up appointment with care management team member scheduled for:  3 months  with pharmacist; CMA to check adherence in 1 month      Medication Assistance: None required.  Patient affirms current coverage meets needs.  [facility]  [id number] [place], [place] [id number]: [phone]: [phone] Follow Up:  Patient agrees to Care Plan and Follow-up.  Plan: Telephone follow up appointment with clinical pharmacist in 4 to 6 weeks to assess blood pressure and adherence.   TaCherre RobinsPharmD Clinical Pharmacist [facility]The [phone]

## 2021-07-27 ENCOUNTER — Other Ambulatory Visit: Payer: Self-pay

## 2021-07-27 ENCOUNTER — Other Ambulatory Visit (INDEPENDENT_AMBULATORY_CARE_PROVIDER_SITE_OTHER): Payer: HMO

## 2021-07-27 DIAGNOSIS — I152 Hypertension secondary to endocrine disorders: Secondary | ICD-10-CM

## 2021-07-27 DIAGNOSIS — E1169 Type 2 diabetes mellitus with other specified complication: Secondary | ICD-10-CM | POA: Diagnosis not present

## 2021-07-27 DIAGNOSIS — E1159 Type 2 diabetes mellitus with other circulatory complications: Secondary | ICD-10-CM

## 2021-07-27 LAB — BASIC METABOLIC PANEL
BUN: 14 mg/dL (ref 6–23)
CO2: 29 mEq/L (ref 19–32)
Calcium: 9.2 mg/dL (ref 8.4–10.5)
Chloride: 100 mEq/L (ref 96–112)
Creatinine, Ser: 1.12 mg/dL (ref 0.40–1.50)
GFR: 62.69 mL/min (ref >60.00)
Glucose, Bld: 125 mg/dL — ABNORMAL HIGH (ref 70–99)
Potassium: 4.4 mEq/L (ref 3.5–5.1)
Sodium: 139 mEq/L (ref 135–145)

## 2021-07-27 LAB — HEMOGLOBIN A1C: Hgb A1c MFr Bld: 6.7 % — ABNORMAL HIGH (ref 4.6–6.5)

## 2021-08-05 ENCOUNTER — Other Ambulatory Visit: Payer: Self-pay | Admitting: Hematology & Oncology

## 2021-08-05 DIAGNOSIS — C8440 Peripheral T-cell lymphoma, not classified, unspecified site: Secondary | ICD-10-CM

## 2021-08-07 ENCOUNTER — Other Ambulatory Visit: Payer: Self-pay | Admitting: Internal Medicine

## 2021-08-22 DIAGNOSIS — E1159 Type 2 diabetes mellitus with other circulatory complications: Secondary | ICD-10-CM | POA: Diagnosis not present

## 2021-08-22 DIAGNOSIS — I152 Hypertension secondary to endocrine disorders: Secondary | ICD-10-CM | POA: Diagnosis not present

## 2021-08-22 DIAGNOSIS — E1169 Type 2 diabetes mellitus with other specified complication: Secondary | ICD-10-CM | POA: Diagnosis not present

## 2021-08-22 DIAGNOSIS — I5020 Unspecified systolic (congestive) heart failure: Secondary | ICD-10-CM

## 2021-08-22 DIAGNOSIS — E785 Hyperlipidemia, unspecified: Secondary | ICD-10-CM | POA: Diagnosis not present

## 2021-08-29 ENCOUNTER — Ambulatory Visit (INDEPENDENT_AMBULATORY_CARE_PROVIDER_SITE_OTHER): Payer: HMO | Admitting: Pharmacist

## 2021-08-29 DIAGNOSIS — E1169 Type 2 diabetes mellitus with other specified complication: Secondary | ICD-10-CM

## 2021-08-29 DIAGNOSIS — I5020 Unspecified systolic (congestive) heart failure: Secondary | ICD-10-CM

## 2021-08-29 DIAGNOSIS — I152 Hypertension secondary to endocrine disorders: Secondary | ICD-10-CM

## 2021-08-29 NOTE — Patient Instructions (Signed)
Visit Information CARE PLAN ENTRY (see longitudinal plan of care for additional care plan information)    Hypertension / Heart / swelling: BP Readings from Last 3 Encounters:  07/08/21 (!) 152/70  04/04/21 (!) 130/54  02/07/21 (!) 142/74   Pharmacist Clinical Goal(s): Over the next 180 days, patient will work with PharmD and providers to achieve BP goal <140/90 and minimize edema and other symptoms of heart failure Current regimen:  hydrochlorothiazide 25mg  daily  Losartan 50mg  - take 2 tablets = 100mg  daily Carvedilol 12.5mg  twice a day Furosemide 40mg  daily as needed for swelling in legs Potassium chloride 10 mEq - take 1 tablet daily Interventions: Discussed blood pressure goal Coordinated refill for losartan to patient's preferred pharmacy Patient self care activities - Over the next 180 days, patient will: Maintain hypertension medication regimen.  It looks like Dr [name] recommended rechecking your heart with an ECHO test in March 2022 (9 months after last appointment in June 2021). I have called their office and they set up an appointment for follow up with Dr [name] for September 27, 2021 at 2:20pm Limit sodium intake to < 2300mg  per day [facility]  Hyperlipidemia Lab Results  Component Value Date/Time   LDLCALC 39 10/14/2019 05:26 AM   LDLDIRECT 76.0 02/07/2021 10:56 AM   Pharmacist Clinical Goal(s): Over the next 180 days, patient will work with PharmD and providers to maintain LDL goal < 100, achieve Triglyceride goal < 150  Current regimen:  Rosuvastatin 10mg  daily Fish Oil 1000mg  daily Interventions: Discussed LDL goal Discussed limiting fried foods Patient self care activities - Over the next 180 days, patient will: Maintain cholesterol medication regimen.   Diabetes Lab Results  Component Value Date/Time   HGBA1C 6.7 (H) 07/27/2021 07:51 AM   HGBA1C 6.2 12/08/2020 10:17 AM   Pharmacist Clinical Goal(s): Over the next  180 days, patient will work with PharmD and providers to maintain A1c goal <7% Current regimen:  Metformin 500mg  twice a day Interventions: Discussed A1c goal Patient self care activities - Over the next 180 days, patient will: Maintain a1c less than 7%  Health Maintenance  Pharmacist Clinical Goal(s) Over the next 90 days, patient will work with PharmD and providers to complete health maintenance screenings/vaccinations Interventions: Discussed covid booster and annual flu vaccine Patient self care activities - Over the next 90 days, patient will: Receive covid booster and annual flu vaccine Coordinated appointment with ortho for left hip pain  Medication management Pharmacist Clinical Goal(s): Over the next 180 days, patient will work with PharmD and providers to achieve optimal medication adherence [facility] Comprehensive medication review performed. Continue current medication management strategy Created list of medications and recommended time of day to take them.  Provided patient with day and night pill container Patient self care activities - Over the next 180 days, patient will: Focus on medication adherence by filling and taking medications appropriately  Take medications as prescribed Report any questions or concerns to PharmD and/or provider(s)  The patient verbalized understanding of instructions, educational materials, and care plan provided today and declined offer to receive copy of patient instructions, educational materials, and care plan.   Telephone follow up appointment with care management team member scheduled for: 1 to 2 months  [name], PharmD Clinical Pharmacist [place] [facility]

## 2021-08-29 NOTE — Chronic Care Management (AMB) (Signed)
Chronic Care Management Pharmacy Note  08/29/2021 Name:  [name] [name] MRN:  [id number] DOB:  1941/05/21  Summary: Adherence improving per refill record Patient in need of follow up with ortho for left hip pain Discussed needed vaccines   Recommendations/Changes made from today's visit: [facility] Coordinating with ortho for appt for left hip pain  Plan: Will continue to follow adherence over the next 3 to 6 months with intervention as needed.   Subjective: JOHNLUKE [name] is an 80 y.o. year old male who is a primary patient of [name], [name], MD.  The CCM team was consulted for assistance with disease management and care coordination needs.    Engaged with patient by telephone for follow up visit in response to provider referral for pharmacy case management and/or care coordination services.   Consent to Services:  The patient was given information about Chronic Care Management services, agreed to services, and gave verbal consent prior to initiation of services.  Please see initial visit note for detailed documentation.   Patient Care Team: Colon Branch, MD as PCP - General [name], [name], MD as PCP - Cardiology (Cardiology) [name], MD as Consulting Physician (Gastroenterology) [name], MD as Consulting Physician (Orthopedic Surgery) [name], MD as Consulting Physician (Physical Medicine and Rehabilitation) [name], MD as Consulting Physician (Otolaryngology) [name], MD as Medical Oncologist (Oncology) [name], [place] as Referring Physician (Optometry) [name], RPH-CPP (Pharmacist) [name], [name], MD as Consulting Physician (Cardiology)  Recent office visits: 07/08/2021 - PCP (Dr [name]) f/u HTN and diabetes. Labs ordered Patient has been out of furosemide and was not sure he needed to restart. Dr [name] recommended restart furosemide 72m daily and to recheck BMP in 2 to 3 weeks.  02/07/2021 - PCP  (Dr PLarose Kells F/U visit; New diagnosis of neuropathy. Referred to podiatry.  12/08/2020 - PCP (Dr PLarose Kells f/u visit; HTN: Currently on losartan 50 mg 2 tablets daily, carvedilol 12.5 mg bid, Lasix and potassium.  Apparently not on HCTZ.  Reason? BP today 153/70.  At home 147/80.  Will not restart HCTZ monitor BPs, consider increase carvedilol.  Recent consult visits: 06/22/19 Podiatry (Dr MPrudence Davidson Mechanical debridement of toe nails. Recheck 3 months.  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 03/22/2021 - Podiatry (Dr MPrudence Davidson Casting for diabetic shoes 03/14/2021 - Podiatry (Dr MPrudence Davidson pain due to onchomycosis. Performed mechanical debridement of nails; no med changes.   Hospital visits: None in previous 6 months  Objective:  Lab Results  Component Value Date   CREATININE 1.12 07/27/2021   CREATININE 1.14 04/04/2021   CREATININE 1.11 12/13/2020    Lab Results  Component Value Date   HGBA1C 6.7 (H) 07/27/2021   Last diabetic Eye exam:  Lab Results  Component Value Date/Time   HMDIABEYEEXA No Retinopathy 11/30/2020 12:00 AM    Last diabetic Foot exam:  Lab Results  Component Value Date/Time   HMDIABFOOTEX Done 06/12/2019 12:00 AM        Component Value Date/Time   CHOL 138 02/07/2021 1056   TRIG 282.0 (H) 02/07/2021 1056   TRIG 247 (HH) 08/30/2006 1117   HDL 39.00 (L) 02/07/2021 1056   CHOLHDL 4 02/07/2021 1056   VLDL 56.4 (H) 02/07/2021 1056   LDLCALC 39 10/14/2019 0526  LDLDIRECT 76.0 02/07/2021 1056    Hepatic Function Latest Ref Rng & Units 04/04/2021 12/13/2020 12/08/2020  Total Protein 6.5 - 8.1 g/dL 6.6 6.9 6.7  Albumin 3.5 - 5.0 g/dL 4.5 4.4 4.2  AST 15 - 41 U/L 13(L) 14(L) 13  ALT 0 - 44 U/L _0 Alk Phosphatase 38 - 126 U/L 76 79 77  Total Bilirubin 0.3 - 1.2 mg/dL 0.7 0.6 0.5   Bilirubin, Direct 0.0 - 0.3 mg/dL - - -    Lab Results  Component Value Date/Time   TSH 2.07 09/23/2019 09:55 AM   TSH 0.71 10/21/2018 02:01 PM    CBC Latest Ref Rng & Units 04/04/2021 12/13/2020 09/09/2020  WBC 4.0 - 10.5 K/uL 6.6 7.5 8.4  Hemoglobin 13.0 - 17.0 g/dL 13.1 12.8(L) 13.0  Hematocrit 39.0 - 52.0 % 38.0(L) 37.5(L) 37.6(L)  Platelets 150 - 400 K/uL 225 229 274    No results found for: VD25OH  Clinical ASCVD: No  The ASCVD Risk score (Arnett DK, et al., 2019) failed to calculate for the following reasons:   The patient has a prior MI or stroke diagnosis     Social History   Tobacco Use  Smoking Status Former   Packs/day: 0.50   Years: 2.00   Pack years: 1.00   Types: Cigarettes   Quit date: 06/09/1966   Years since quitting: 55.2  Smokeless Tobacco Never   BP Readings from Last 3 Encounters:  07/08/21 (!) 152/70  04/04/21 (!) 130/54  02/07/21 (!) 142/74   Pulse Readings from Last 3 Encounters:  07/08/21 64  04/04/21 65  02/07/21 64   Wt Readings from Last 3 Encounters:  07/08/21 (!) 302 lb 4 oz (137.1 kg)  04/04/21 299 lb (135.6 kg)  02/07/21 293 lb (132.9 kg)    Assessment: Review of patient past medical history, allergies, medications, health status, including review of consultants reports, laboratory and other test data, was performed as part of comprehensive evaluation and provision of chronic care management services.   SDOH:  (Social Determinants of Health) assessments and interventions performed:  SDOH Interventions    Flowsheet Row Most Recent Value  SDOH Interventions   Physical Activity Interventions Other (Comments)  [not exercising due to left hip pain,  made appt with ortho today.]       CCM Care Plan  Allergies  Allergen Reactions   Doxycycline Rash    Medications Reviewed Today     Reviewed by [name], RPH-CPP (Pharmacist) on 08/29/21 at [place] List Status: <None>   Medication Order Taking? Sig Documenting  Provider Last Dose Status Informant  allopurinol (ZYLOPRIM) 300 MG tablet [id number] Yes TAKE 1 TABLET BY MOUTH EVERY DAY [name], MD Taking Active   aspirin 81 MG chewable tablet [id number] Yes Chew 81 mg by mouth daily. [provider] Taking Active   carvedilol (COREG) 12.5 MG tablet [id number] Yes Take 1 tablet (12.5 mg total) by mouth 2 (two) times daily. Colon Branch, MD Taking Active   diphenhydramine-acetaminophen (TYLENOL PM) 25-500 MG TABS tablet [id number] Yes Take 1 tablet by mouth at bedtime as needed. [provider] Taking Active   furosemide (LASIX) 40 MG tablet [id number] Yes Take 1 tablet (40 mg total) by mouth daily as needed for fluid or edema. Colon Branch, MD Taking Active   hydrochlorothiazide (HYDRODIURIL) 25 MG tablet [id number] Yes TAKE 1 TABLET BY MOUTH EVERY DAY [name], [name], MD Taking Active   KLOR-CON M10 10 MEQ  tablet [id number] Yes TAKE 1 TABLET BY MOUTH EVERY DAY Colon Branch, MD Taking Active   losartan (COZAAR) 50 MG tablet [id number] Yes TAKE 2 TABLETS BY MOUTH EVERY DAY Colon Branch, MD Taking Active   metFORMIN (GLUCOPHAGE) 500 MG tablet [id number] Yes Take 1 tablet (500 mg total) by mouth daily with breakfast. Colon Branch, MD Taking Active   Omega-3 Fatty Acids (FISH OIL) 1000 MG CAPS [id number] Yes Take 1,000 mg by mouth daily. [provider] Taking Active Self           Med Note (HILL, TIFFANY A   Fri May 28, 2020 10:20 AM)    rosuvastatin (CRESTOR) 10 MG tablet [id number] Yes TAKE 1 TABLET BY MOUTH EVERY DAY Bensimhon, [name], MD Taking Active             Patient Active Problem List   Diagnosis Date Noted   Pain due to onychomycosis of toenails of both feet 03/14/2021   Pincer nail deformity 75/64/3329   Systolic heart failure ([place]) 11/07/2019   Hyponatremia 11/07/2019   Acute CHF (congestive heart failure) ([place]) 10/09/2019   Elevated troponin 10/09/2019   Antineoplastic chemotherapy induced pancytopenia ([place]) 10/09/2019    Anemia due to antineoplastic chemotherapy 10/06/2019   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   Stasis dermatitis of both legs 06/21/2019   Diabetes mellitus, type II ([place]) 07/09/2017   Status post lumbar surgery 06/06/2017   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/22/2016   PVC's (premature ventricular contractions) 11/24/2015   PCP NOTES >>>>>>>>>>>>>>>>>>>>>> 06/23/2015   Morbid obesity ([place]) 02/24/2015   Bradycardia 11/19/2013   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   Palpitations 04/19/2013   Annual physical exam 12/07/2011   R calf larger than L, chronic, U/S (-) for DVT 11-2011 12/07/2011   Osteoarthritis 01/30/2008   Hyperlipidemia associated with type 2 diabetes mellitus ([place]) 12/19/2007   Hypertension associated with diabetes ([place]) 12/19/2007   ERECTILE DYSFUNCTION 01/28/2007   GERD 01/28/2007    Immunization History  Administered Date(s) Administered   Fluad Quad(high Dose 65+) 06/20/2019, 10/04/2020   H1N1 11/12/2008   Influenza Split 07/17/2014, 07/31/2018   Influenza Whole 08/04/2010   Influenza,inj,Quad PF,6+ Mos 06/23/2015   Influenza-Unspecified 07/23/2013, 07/07/2016, 07/20/2017   Moderna Sars-Covid-2 Vaccination 03/16/2020, 04/14/2020   Pneumococcal Conjugate-13 08/12/2014   Pneumococcal Polysaccharide-23 11/12/2008, 05/28/2019   Td 12/19/2007   Tdap 04/04/2018   Zoster Recombinat (Shingrix) 12/20/2018, 05/12/2019   Zoster, Live 10/24/2011    Conditions to be addressed/monitored: HTN, HLD, DMII, and gout / OA; T-Cell lymphoma angioimmunoblastic (in remission) LV dysfunction vs. CHF  Care Plan : General Pharmacy (Adult)  Updates made by [name], RPH-CPP since 08/29/2021 12:00 AM     Problem: Pharmacy Care Plan - Medication and Chronic Care Management   Priority: High  Onset Date: 04/13/2021     Long-Range Goal: Long Range goals of CCM and medication management   Start Date: 04/13/2021  Recent Progress: Not on track  Priority: High  Note:   Current  Barriers:  Unable to maintain control of HTN Does not adhere to prescribed medication regimen Does not maintain contact with provider office  Pharmacist Clinical Goal(s):  Over the next 90 days, patient will achieve control of HTN as evidenced by BP < 140/90 maintain control of hyperlipidemia and CHF as evidenced by maintaining LDL <100 and take medications as prescribed  adhere to prescribed medication regimen as evidenced by refill history and patient self report  through collaboration with PharmD and provider.   Interventions:  1:1 collaboration with Colon Branch, MD regarding development and update of comprehensive plan of care as evidenced by provider attestation and co-signature Inter-disciplinary care team collaboration (see longitudinal plan of care) Comprehensive medication review performed; medication list updated in electronic medical record   Hypertension  BP goal <140/90 Recent office BPs have been variable Patient is checking BP at home a few times per week. Reports blood pressure ranges 140 - 150 / 70's Reports dizziness once when he took 2 carvedilol 12.91m tablets together in the morning Current regimen:  hydrochlorothiazide 254mdaily  Losartan 5046m take 2 tablets = 100m26mily Carvedilol 12.5mg 10mce a day Furosemide 40mg 17my as needed for fluid or edema (restarted 07/08/2021) Interventions: Discussed blood pressure goal Coordinated [facility] for [facility] Reviewed directions for taking carvedilol - should be twice a day. Advised him not to take 2 tablets once a day as this might lead to low blood pressure in morning and high blood pressure later in the day. (See below for more interventions regarding medication adherence)  Limit sodium intake to < 2300mg p74may    HFpEF / suspect transient LV dysfunction due to critical illness: Controlled; goal: limit edema Last EF was 60-65% (12/22/2019) - was as low as 40-45% when receiving  chemo in 2021 Carvedilol last filled for 90 day supply 07/09/2021 (assisted in coordinating at last visti)  Current regimen:  Carvedilol 12.5mg twi75ma day  Furosemide 40mg dai16ms needed for swelling in legs Potassium chloride 10 mEq - take 1 tablet daily Losartan 50mg - ta42m tablets = 100mg daily3merventions: Patient was due to follow up with Heart Failure clinic in March 2022 to recheck ECHO. Assisted in getting appointment for cardio f/u at last visit. Appointment is 09/27/2021 with Dr Bensimhon MHaroldine Lawsurrent  regimen.  Limit sodium intake to < 2300mg per da69myperlipidemia Controlled; LDL goal < 100, achieve Triglyceride goal < 150  Filled rosuvastatin for 90 days 07/25/2021 after our last visit Current regimen:  Rosuvastatin 10mg daily F28mOil 1000mg daily In41mentions: Discussed LDL goal Discussed limiting fried foods Reviewed medications for lipids - continue current regimen  Diabetes Controlled; A1c goal <7% Current regimen:  Metformin 500mg twice a d44mnterventions: Continue current regimen  Health Maintenance  Interventions: Discussed covid booster vaccine Discussed annual flu vaccine  Hip Pain:  patient states he is having increased in left hip pain. He would like to go back to see Dr Olin or GiofreeAlvan DamessisEmiliano Dyerient in getting appointment with ortho for follow up  Medication management Decreased adherence due to difficulty getting refills on prescriptions [facility] Interventions Comprehensive medication review performed. Continue current medication management strategy Provided weekly pill planned / container for patient to use to help increase adherence. Left at front desk for patient to pick up 07/27/2021 Coordinated with pharmacy and PCP to get refills for needed medications - metformin, hydrochlorothiazide and rosuvastatin Left message with cardiologist office (Dr Bensimohn) to sJeffie Pollockfollow up visit.  Report any questions  or concerns to PharmD and/or provider(s)  Patient Goals/Self-Care Activities Over the next 90 days, patient will:  take medications as prescribed, collaborate with provider on medication access solutions, and engage in dietary modifications by limiting intake of sodium to <2300mg per day  F78mw Up Plan: Telephone follow up appointment with care management team member scheduled for:  2 to 3 months        Medication Assistance: None required.  Patient affirms  current coverage meets needs.  [id number] [address] [place] [place] [address]Phone: [phone]: [phone]  Follow Up:  Patient agrees to Care Plan and Follow-up.  Plan: Telephone follow up appointment with clinical pharmacist in 1 to 2 months   TCherre Robins PharmD Clinical Pharmacist LJuana DiazMRock [phone]

## 2021-08-31 DIAGNOSIS — G8929 Other chronic pain: Secondary | ICD-10-CM | POA: Diagnosis not present

## 2021-08-31 DIAGNOSIS — M25562 Pain in left knee: Secondary | ICD-10-CM | POA: Diagnosis not present

## 2021-08-31 DIAGNOSIS — M5416 Radiculopathy, lumbar region: Secondary | ICD-10-CM | POA: Diagnosis not present

## 2021-08-31 DIAGNOSIS — E119 Type 2 diabetes mellitus without complications: Secondary | ICD-10-CM | POA: Diagnosis not present

## 2021-09-02 DIAGNOSIS — M7062 Trochanteric bursitis, left hip: Secondary | ICD-10-CM | POA: Diagnosis not present

## 2021-09-02 DIAGNOSIS — M545 Low back pain, unspecified: Secondary | ICD-10-CM | POA: Diagnosis not present

## 2021-09-21 DIAGNOSIS — E785 Hyperlipidemia, unspecified: Secondary | ICD-10-CM

## 2021-09-21 DIAGNOSIS — E1159 Type 2 diabetes mellitus with other circulatory complications: Secondary | ICD-10-CM

## 2021-09-21 DIAGNOSIS — E1169 Type 2 diabetes mellitus with other specified complication: Secondary | ICD-10-CM

## 2021-09-21 DIAGNOSIS — I5020 Unspecified systolic (congestive) heart failure: Secondary | ICD-10-CM

## 2021-09-21 DIAGNOSIS — I152 Hypertension secondary to endocrine disorders: Secondary | ICD-10-CM | POA: Diagnosis not present

## 2021-09-23 DIAGNOSIS — M5451 Vertebrogenic low back pain: Secondary | ICD-10-CM | POA: Diagnosis not present

## 2021-09-27 ENCOUNTER — Other Ambulatory Visit: Payer: Self-pay

## 2021-09-27 ENCOUNTER — Encounter (HOSPITAL_COMMUNITY): Payer: Self-pay | Admitting: Internal Medicine

## 2021-09-27 ENCOUNTER — Ambulatory Visit: Payer: HMO | Admitting: Podiatry

## 2021-09-27 ENCOUNTER — Ambulatory Visit (HOSPITAL_COMMUNITY)
Admission: RE | Admit: 2021-09-27 | Discharge: 2021-09-27 | Disposition: A | Discharge status: Home / Self Care | Payer: HMO | Source: Ambulatory Visit | Attending: Internal Medicine | Admitting: Internal Medicine

## 2021-09-27 VITALS — BP 144/76 | HR 84 | Wt 298.8 lb

## 2021-09-27 DIAGNOSIS — E119 Type 2 diabetes mellitus without complications: Secondary | ICD-10-CM | POA: Insufficient documentation

## 2021-09-27 DIAGNOSIS — C8590 Non-Hodgkin lymphoma, unspecified, unspecified site: Secondary | ICD-10-CM

## 2021-09-27 DIAGNOSIS — I10 Essential (primary) hypertension: Secondary | ICD-10-CM | POA: Diagnosis not present

## 2021-09-27 DIAGNOSIS — I2510 Atherosclerotic heart disease of native coronary artery without angina pectoris: Secondary | ICD-10-CM | POA: Diagnosis not present

## 2021-09-27 DIAGNOSIS — I498 Other specified cardiac arrhythmias: Secondary | ICD-10-CM | POA: Diagnosis not present

## 2021-09-27 DIAGNOSIS — I428 Other cardiomyopathies: Secondary | ICD-10-CM | POA: Diagnosis not present

## 2021-09-27 DIAGNOSIS — I5020 Unspecified systolic (congestive) heart failure: Secondary | ICD-10-CM

## 2021-09-27 DIAGNOSIS — M25559 Pain in unspecified hip: Secondary | ICD-10-CM | POA: Diagnosis not present

## 2021-09-27 DIAGNOSIS — I110 Hypertensive heart disease with heart failure: Secondary | ICD-10-CM | POA: Insufficient documentation

## 2021-09-27 DIAGNOSIS — Z79899 Other long term (current) drug therapy: Secondary | ICD-10-CM | POA: Insufficient documentation

## 2021-09-27 DIAGNOSIS — I440 Atrioventricular block, first degree: Secondary | ICD-10-CM | POA: Insufficient documentation

## 2021-09-27 DIAGNOSIS — Z8572 Personal history of non-Hodgkin lymphomas: Secondary | ICD-10-CM | POA: Insufficient documentation

## 2021-09-27 DIAGNOSIS — Z9221 Personal history of antineoplastic chemotherapy: Secondary | ICD-10-CM | POA: Diagnosis not present

## 2021-09-27 DIAGNOSIS — E785 Hyperlipidemia, unspecified: Secondary | ICD-10-CM | POA: Diagnosis not present

## 2021-09-27 MED ORDER — SPIRONOLACTONE 25 MG PO TABS: 12.5000 mg | ORAL_TABLET | Freq: Every day | ORAL | 3 refills | Status: DC
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
## 2021-09-27 NOTE — Addendum Note (Signed)
Encounter addended by: Jerl Mina, RN on: 09/27/2021 3:14 PM  Actions taken: Visit diagnoses modified, Order list changed, Diagnosis association updated, Clinical Note Signed

## 2021-09-27 NOTE — Patient Instructions (Signed)
Medication Changes:  Start Spiro 12.5mg  (1/2 Tab) Daily.  Lab Work:  none  Testing/Procedures:  BMET with Oncology  Referrals:  none  Special Instructions // Education:  none  Follow-Up in: 1 year  At the [facility], you and your health needs are our priority. We have a designated team specialized in the treatment of Heart Failure. This Care Team includes your primary Heart Failure Specialized Cardiologist (physician), Advanced Practice Providers (APPs- Physician Assistants and Nurse Practitioners), and Pharmacist who all work together to provide you with the care you need, when you need it.   You may see any of the following providers on your designated Care Team at your next follow up:  Dr Glori Bickers Dr Haynes Kerns, NP Lyda Jester, [place] [facility] [place], [place] Audry Riles, PharmD   Please be sure to bring in all your medications bottles to every appointment.   Need to Contact us:  If you have any questions or concerns before your next appointment please send [place] a message through [place] or call our office at [phone].    TO LEAVE A MESSAGE FOR THE NURSE SELECT OPTION 2, PLEASE LEAVE A MESSAGE INCLUDING: YOUR NAME DATE OF BIRTH CALL BACK NUMBER REASON FOR CALL**this is important as we prioritize the call backs  YOU WILL RECEIVE A CALL BACK THE SAME DAY AS LONG AS YOU CALL BEFORE 4:00 PM

## 2021-09-28 ENCOUNTER — Encounter: Payer: Self-pay | Admitting: Podiatry

## 2021-09-28 ENCOUNTER — Ambulatory Visit: Payer: HMO | Admitting: Podiatry

## 2021-09-28 DIAGNOSIS — L608 Other nail disorders: Secondary | ICD-10-CM

## 2021-09-28 DIAGNOSIS — M79674 Pain in right toe(s): Secondary | ICD-10-CM | POA: Diagnosis not present

## 2021-09-28 DIAGNOSIS — B351 Tinea unguium: Secondary | ICD-10-CM | POA: Diagnosis not present

## 2021-09-28 DIAGNOSIS — M79675 Pain in left toe(s): Secondary | ICD-10-CM | POA: Diagnosis not present

## 2021-09-28 DIAGNOSIS — E1169 Type 2 diabetes mellitus with other specified complication: Secondary | ICD-10-CM | POA: Diagnosis not present

## 2021-09-28 NOTE — Progress Notes (Signed)
This patient returns to my office for at risk foot care.  This patient requires this care by a professional since this patient will be at risk due to having diabetes type 2. This patient is having problems with his left knee and hip.  This patient is unable to cut nails himself since the patient cannot reach his nails.These nails are painful walking and wearing shoes.  This patient presents for at risk foot care today.  General Appearance  Alert, conversant and in no acute stress.  Vascular  Dorsalis pedis  are palpable  bilaterally. Posterior tibial pulses are absent  B/L. Capillary return is within normal limits  bilaterally. Cold feet  Bilaterally. Absent hair  B/L.  Neurologic  Senn-Weinstein monofilament wire test within normal limits/diminished   bilaterally. Muscle power within normal limits bilaterally.  Nails Thick disfigured discolored nails with subungual debris  Hallux nails. No evidence of bacterial infection or drainage bilaterally.  Orthopedic  No limitations of motion  feet .  No crepitus or effusions noted.  No bony pathology or digital deformities noted.  HAV  B/L.  Skin  normotropic skin with no porokeratosis noted bilaterally.  No signs of infections or ulcers noted.     Onychomycosis  Pain in right toes  Pain in left toes  Consent was obtained for treatment procedures.   Mechanical debridement of nails 1-5  bilaterally performed with a nail nipper.  Filed with dremel without incident.    Return office visit     3 months.                Told patient to return for periodic foot care and evaluation due to potential at risk complications.   Nieshia Larmon DPM  

## 2021-10-03 ENCOUNTER — Other Ambulatory Visit: Payer: Self-pay | Admitting: Internal Medicine

## 2021-10-04 ENCOUNTER — Inpatient Hospital Stay: Payer: HMO | Attending: Hematology & Oncology

## 2021-10-04 ENCOUNTER — Other Ambulatory Visit: Payer: Self-pay

## 2021-10-04 ENCOUNTER — Encounter: Payer: Self-pay | Admitting: Hematology & Oncology

## 2021-10-04 ENCOUNTER — Inpatient Hospital Stay (HOSPITAL_BASED_OUTPATIENT_CLINIC_OR_DEPARTMENT_OTHER): Payer: HMO | Admitting: Hematology & Oncology

## 2021-10-04 VITALS — BP 155/84 | HR 58 | Temp 98.0°F | Resp 18 | Wt 295.5 lb

## 2021-10-04 DIAGNOSIS — C8440 Peripheral T-cell lymphoma, not classified, unspecified site: Secondary | ICD-10-CM | POA: Diagnosis not present

## 2021-10-04 LAB — CMP (CANCER CENTER ONLY)
ALT: 17 U/L (ref 0–44)
AST: 12 U/L — ABNORMAL LOW (ref 15–41)
Albumin: 4.5 g/dL (ref 3.5–5.0)
Alkaline Phosphatase: 72 U/L (ref 38–126)
Anion gap: 11 (ref 5–15)
BUN: 20 mg/dL (ref 8–23)
CO2: 29 mmol/L (ref 22–32)
Calcium: 9.9 mg/dL (ref 8.9–10.3)
Chloride: 99 mmol/L (ref 98–111)
Creatinine: 1.17 mg/dL (ref 0.61–1.24)
GFR, Estimated: >60 mL/min (ref >60)
Glucose, Bld: 114 mg/dL — ABNORMAL HIGH (ref 70–99)
Potassium: 4 mmol/L (ref 3.5–5.1)
Sodium: 139 mmol/L (ref 135–145)
Total Bilirubin: 0.7 mg/dL (ref 0.3–1.2)
Total Protein: 6.9 g/dL (ref 6.5–8.1)

## 2021-10-04 LAB — CBC WITH DIFFERENTIAL (CANCER CENTER ONLY)
Abs Immature Granulocytes: 0.08 10*3/uL — ABNORMAL HIGH (ref 0.00–0.07)
Basophils Absolute: 0.1 10*3/uL (ref 0.0–0.1)
Basophils Relative: 1 %
Eosinophils Absolute: 0.3 10*3/uL (ref 0.0–0.5)
Eosinophils Relative: 3 %
HCT: 40.1 % (ref 39.0–52.0)
Hemoglobin: 13.8 g/dL (ref 13.0–17.0)
Immature Granulocytes: 1 %
Lymphocytes Relative: 15 %
Lymphs Abs: 1.2 10*3/uL (ref 0.7–4.0)
MCH: 32.2 pg (ref 26.0–34.0)
MCHC: 34.4 g/dL (ref 30.0–36.0)
MCV: 93.5 fL (ref 80.0–100.0)
Monocytes Absolute: 0.8 10*3/uL (ref 0.1–1.0)
Monocytes Relative: 10 %
Neutro Abs: 5.8 10*3/uL (ref 1.7–7.7)
Neutrophils Relative %: 70 %
Platelet Count: 264 10*3/uL (ref 150–400)
RBC: 4.29 MIL/uL (ref 4.22–5.81)
RDW: 13.2 % (ref 11.5–15.5)
WBC Count: 8.2 10*3/uL (ref 4.0–10.5)
nRBC: 0 % (ref 0.0–0.2)

## 2021-10-04 LAB — LACTATE DEHYDROGENASE: LDH: 146 U/L (ref 98–192)
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
## 2021-10-05 DIAGNOSIS — M545 Low back pain, unspecified: Secondary | ICD-10-CM | POA: Diagnosis not present

## 2021-10-11 DIAGNOSIS — M5451 Vertebrogenic low back pain: Secondary | ICD-10-CM | POA: Diagnosis not present

## 2021-10-15 ENCOUNTER — Other Ambulatory Visit: Payer: Self-pay | Admitting: Internal Medicine

## 2021-10-17 ENCOUNTER — Other Ambulatory Visit (HOSPITAL_COMMUNITY): Payer: Self-pay | Admitting: Internal Medicine

## 2021-10-19 DIAGNOSIS — M5451 Vertebrogenic low back pain: Secondary | ICD-10-CM | POA: Diagnosis not present

## 2021-10-27 DIAGNOSIS — M5451 Vertebrogenic low back pain: Secondary | ICD-10-CM | POA: Diagnosis not present

## 2021-10-31 ENCOUNTER — Telehealth: Payer: Self-pay | Admitting: Pharmacist

## 2021-10-31 ENCOUNTER — Ambulatory Visit (INDEPENDENT_AMBULATORY_CARE_PROVIDER_SITE_OTHER): Payer: HMO | Admitting: Pharmacist

## 2021-10-31 DIAGNOSIS — E1169 Type 2 diabetes mellitus with other specified complication: Secondary | ICD-10-CM

## 2021-10-31 DIAGNOSIS — I5020 Unspecified systolic (congestive) heart failure: Secondary | ICD-10-CM

## 2021-10-31 DIAGNOSIS — E1159 Type 2 diabetes mellitus with other circulatory complications: Secondary | ICD-10-CM

## 2021-10-31 DIAGNOSIS — I152 Hypertension secondary to endocrine disorders: Secondary | ICD-10-CM

## 2021-10-31 DIAGNOSIS — E785 Hyperlipidemia, unspecified: Secondary | ICD-10-CM

## 2021-10-31 MED ORDER — LOSARTAN POTASSIUM 100 MG PO TABS: 100.0000 mg | ORAL_TABLET | Freq: Every day | ORAL | 1 refills | Status: DC

## 2021-10-31 MED ORDER — POTASSIUM CHLORIDE CRYS ER 10 MEQ PO TBCR: 10.0000 meq | EXTENDED_RELEASE_TABLET | Freq: Every day | ORAL | 1 refills | Status: DC

## 2021-10-31 NOTE — Telephone Encounter (Signed)
Opened error

## 2021-10-31 NOTE — Patient Instructions (Signed)
[name] [name] It was a pleasure speaking with you today.  I have attached a summary of our visit today and information about your health goals.    If you have any questions or concerns, please feel free to contact me either at the phone number below or with a MyChart message.   Keep up the good work!  [name], PharmD Clinical Pharmacist [facility] [phone] (direct line)  [phone] (main office number)  CARE PLAN ENTRY    Hypertension / Heart / swelling: BP Readings from Last 3 Encounters:  10/04/21 (!) 155/84  09/27/21 (!) 144/76  07/08/21 (!) 152/70   Pharmacist Clinical Goal(s): Over the next 180 days, patient will work with PharmD and providers to achieve BP goal <140/90 and minimize edema and other symptoms of heart failure Current regimen:  hydrochlorothiazide 25mg  daily  Spironolactone 25mg  - take 0.5 tablet = 12.5mg  daily (started 09/27/2021) Losartan 50mg  - take 2 tablets = 100mg  daily Carvedilol 12.5mg  twice a day Furosemide 40mg  daily as needed for fluid or edema (restarted 07/08/2021) Interventions: Discussed blood pressure goal Coordinated refill for losartan to patient's preferred pharmacy Patient self care activities - Over the next 180 days, patient will: Maintain hypertension medication regimen.  Limit sodium intake to < 2300mg  per day Changed Losartan to 100mg  - take 1 tablet daily. [facility].  Hyperlipidemia Lab Results  Component Value Date/Time   LDLCALC 39 10/14/2019 05:26 AM   LDLDIRECT 76.0 02/07/2021 10:56 AM   Pharmacist Clinical Goal(s): Over the next 180 days, patient will work with PharmD and providers to maintain LDL goal < 100, achieve Triglyceride goal < 150  Current regimen:  Rosuvastatin 10mg  daily Fish Oil 1000mg  daily Interventions: Discussed LDL goal Discussed limiting fried foods Patient self care activities - Over the next 180 days, patient will: Maintain  cholesterol medication regimen.   Diabetes Lab Results  Component Value Date/Time   HGBA1C 6.7 (H) 07/27/2021 07:51 AM   HGBA1C 6.2 12/08/2020 10:17 AM   Pharmacist Clinical Goal(s): Over the next 180 days, patient will work with PharmD and providers to maintain A1c goal <7% Current regimen:  Metformin 500mg  twice a day Interventions: Discussed A1c goal Patient self care activities - Over the next 180 days, patient will: Maintain a1c less than 7%  Medication management Pharmacist Clinical Goal(s): Over the next 180 days, patient will work with PharmD and providers to achieve optimal medication adherence [facility] Interventions Comprehensive medication review performed. Continue current medication management strategy Created list of medications and recommended time of day to take them.  Provided patient with day and night pill container Patient self care activities - Over the next 180 days, patient will: Focus on medication adherence by filling and taking medications appropriately  Take medications as prescribed Report any questions or concerns to PharmD and/or provider(s)  Please see past updates related to this goal by clicking on the "Past Updates" button in the selected goal   The patient verbalized understanding of instructions, educational materials, and care plan provided today and declined offer to receive copy of patient instructions, educational materials, and care plan.

## 2021-10-31 NOTE — Chronic Care Management (AMB) (Signed)
Chronic Care Management Pharmacy Note  10/31/2021 Name:  [name] [name] MRN:  [id number] DOB:  [birth year]  Summary: Adherence improved per refill record Updated prescription for losartan from 2 tabs of 50m to take 1 tablet of 1044mto decrease pill burden.  [place]  Plan: Will continue to follow adherence over the next 3 to 6 months with intervention as needed.   Subjective: [name] FOTIs an [age].o. year old male who is a primary patient of [name], [name] [name].  The CCM team was consulted for assistance with disease management and care coordination needs.    Engaged with patient by telephone for follow up visit in response to provider referral for pharmacy case management and/or care coordination services.   Consent to Services:  The patient was given information about Chronic Care Management services, agreed to services, and gave verbal consent prior to initiation of services.  Please see initial visit note for detailed documentation.   Patient Care Team: PaColon BranchMD as PCP - General [name] [name] as PCP - Cardiology (Cardiology) BuRonald LoboMD as Consulting Physician (Gastroenterology) SuJustice BritainMD as Consulting Physician (Orthopedic Surgery) [name] [name] as Consulting Physician (Physical Medicine and Rehabilitation) [name] [name] as Consulting Physician (Otolaryngology) [name] [name] as Medical Oncologist (Oncology) GoMelissa [name] Referring Physician (Optometry) EcCherre [name] (Pharmacist) BeHaroldine LawsDaShaune PascalMD as Consulting Physician (Cardiology)  Recent office visits: 07/08/2021 - PCP (Dr PaLarose Kellsf/u HTN and diabetes. Labs ordered Patient has been out of furosemide and was not sure he needed to restart. Dr PaLarose Kellsecommended restart furosemide 4087maily and to recheck BMP in 2 to 3 weeks.    Recent consult visits: 10/04/2021 -  Hem/Onc (Dr EnnMarin Olpeen for antioimmuniblastic lymphoma. No medicaiton changes.  09/28/2021 - Podiatry (Dr MayPrudence Davidsoneen for pain due to onychomycosis. Mechanical debridement performed.  09/27/2021 - Cardio (Dr BenHaroldine Lawsollow up nonischemic cardiomyopathy / CHF. Started spironolactone 44m48mtake 0.5 tablet daily 08/31/2021 - [facility] ([place], PAC) Seen for left hip pain - initial consult. Xray ordered. referred to physical therapy.  06/22/19 Podiatry (Dr MayePrudence Davidsonchanical debridement of toe nails. Recheck 3 months.  06/02/2021 - Podiatry - picked up diabetic shoes and custom inserts 04/04/2021 - Oncology (Dr EnneMarin OlpU angioimmunoblastic lymphoma S/p CHP + Adcetris x 6 cycles -- completed on 01/19/2020; No med changes; f/u 6 months.  Hospital visits: None in previous 6 months  Objective:  Lab Results  Component Value Date   CREATININE 1.17 10/04/2021   CREATININE 1.12 07/27/2021   CREATININE 1.14 04/04/2021    Lab Results  Component Value Date   HGBA1C 6.7 (H) 07/27/2021   Last diabetic Eye exam:  Lab Results  Component Value Date/Time   HMDIABEYEEXA No Retinopathy 11/30/2020 12:00 AM    Last diabetic Foot exam:  Lab Results  Component Value Date/Time   HMDIABFOOTEX Done 06/12/2019 12:00 AM        Component Value Date/Time   CHOL 138 02/07/2021 1056   TRIG 282.0 (H) 02/07/2021 1056   TRIG 247 (HH) 08/30/2006 1117   HDL 39.00 (L) 02/07/2021 1056   CHOLHDL 4 02/07/2021 1056   VLDL 56.4 (H) 02/07/2021 1056   LDLCALC 39 10/14/2019 0526   LDLDIRECT 76.0 02/07/2021 1056    Hepatic Function Latest Ref Rng & Units 10/04/2021 04/04/2021  12/13/2020  Total Protein 6.5 - 8.1 g/dL 6.9 6.6 6.9  Albumin 3.5 - 5.0 g/dL 4.5 4.5 4.4  AST 15 - 41 U/L 12(L) 13(L) 14(L)  ALT 0 - 44 U/L _0 Alk Phosphatase 38 - 126 U/L 72 76 79  Total Bilirubin 0.3 - 1.2 mg/dL 0.7 0.7 0.6  Bilirubin, Direct 0.0 - 0.3 mg/dL - - -    Lab Results  Component Value Date/Time    TSH 2.07 09/23/2019 09:55 AM   TSH 0.71 10/21/2018 02:01 PM    CBC Latest Ref Rng & Units 10/04/2021 04/04/2021 12/13/2020  WBC 4.0 - 10.5 K/uL 8.2 6.6 7.5  Hemoglobin 13.0 - 17.0 g/dL 13.8 13.1 12.8(L)  Hematocrit 39.0 - 52.0 % 40.1 38.0(L) 37.5(L)  Platelets 150 - 400 K/uL 264 225 229    No results found for: VD25OH  Clinical ASCVD: No  The ASCVD Risk score (Arnett DK, et al., 2019) failed to calculate for the following reasons:   The patient has a prior MI or stroke diagnosis     Social History   Tobacco Use  Smoking Status Former   Packs/day: 0.50   Years: 2.00   Pack years: 1.00   Types: Cigarettes   Quit date: 06/09/1966   Years since quitting: 55.4  Smokeless Tobacco Never   BP Readings from Last 3 Encounters:  10/04/21 (!) 155/84  09/27/21 (!) 144/76  07/08/21 (!) 152/70   Pulse Readings from Last 3 Encounters:  10/04/21 (!) 58  09/27/21 84  07/08/21 64   Wt Readings from Last 3 Encounters:  10/04/21 295 lb 8 oz (134 kg)  09/27/21 298 lb 12.8 oz (135.5 kg)  07/08/21 (!) 302 lb 4 oz (137.1 kg)    Assessment: Review of patient past medical history, allergies, medications, health status, including review of consultants reports, laboratory and other test data, was performed as part of comprehensive evaluation and provision of chronic care management services.   SDOH:  (Social Determinants of Health) assessments and interventions performed:  SDOH Interventions    Flowsheet Row Most Recent Value  SDOH Interventions   Physical Activity Interventions Other (Comments)  [plans to start using exercise bike more.]       CCM Care Plan  Allergies  Allergen Reactions   Doxycycline Rash    Medications Reviewed Today     Reviewed by [name] Robins, RPH-CPP (Pharmacist) on 10/31/21 at 1048  Med List Status: <None>   Medication Order Taking? Sig Documenting Provider Last Dose Status Informant  allopurinol (ZYLOPRIM) 300 MG tablet [id number] Yes TAKE 1 TABLET BY  MOUTH EVERY DAY [name], MD Taking Active   aspirin 81 MG chewable tablet [id number] Yes Chew 81 mg by mouth daily. [provider] Taking Active   carvedilol (COREG) 12.5 MG tablet [id number] Yes TAKE 1 TABLET BY MOUTH 2 TIMES DAILY. Colon Branch, MD Taking Active   diphenhydramine-acetaminophen (TYLENOL PM) 25-500 MG TABS tablet [id number] Yes Take 1 tablet by mouth at bedtime as needed. [provider] Taking Active   furosemide (LASIX) 40 MG tablet [id number] Yes Take 1 tablet (40 mg total) by mouth daily as needed for fluid or edema. Colon Branch, MD Taking Active   hydrochlorothiazide (HYDRODIURIL) 25 MG tablet [id number] Yes TAKE 1 TABLET BY MOUTH EVERY DAY Colon Branch, MD Taking Active   KLOR-CON M10 10 MEQ tablet [id number] Yes TAKE 1 TABLET BY MOUTH EVERY DAY Colon Branch, MD Taking Active   losartan (COZAAR) 100  MG tablet [id number]  Take 1 tablet (100 mg total) by mouth daily. Colon Branch, MD  Active   meloxicam [facility]) 7.5 MG tablet [id number] Yes Take 7.5 mg by mouth daily. [provider] Taking Active   metFORMIN (GLUCOPHAGE) 500 MG tablet [id number] Yes TAKE 1 TABLET BY MOUTH EVERY DAY WITH BREAKFAST Colon Branch, MD Taking Active   Omega-3 Fatty Acids (FISH OIL) 1000 MG CAPS [id number] Yes Take 1,000 mg by mouth daily. [provider] Taking Active Self           Med Note (HILL, TIFFANY A   Fri May 28, 2020 10:20 AM)    rosuvastatin (CRESTOR) 10 MG tablet [id number] Yes TAKE 1 TABLET BY MOUTH EVERY DAY Bensimhon, [name], MD Taking Active   spironolactone (ALDACTONE) 25 MG tablet [id number] Yes Take 0.5 tablets (12.5 mg total) by mouth daily. Bensimhon, [name], MD Taking Active             Patient Active Problem List   Diagnosis Date Noted   Pain due to onychomycosis of toenails of both feet 03/14/2021   Pincer nail deformity 38/17/7116   Systolic heart failure ([place]) 11/07/2019   Hyponatremia 11/07/2019   Acute CHF (congestive heart  failure) ([place]) 10/09/2019   Elevated troponin 10/09/2019   Antineoplastic chemotherapy induced pancytopenia ([place]) 10/09/2019   Anemia due to antineoplastic chemotherapy 10/06/2019   Angioimmunoblastic lymphoma ([place]) 09/08/2019   Stasis dermatitis of both legs 06/21/2019   Diabetes mellitus, type II ([place]) 07/09/2017   Status post lumbar surgery 06/06/2017   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/22/2016   PVC's (premature ventricular contractions) 11/24/2015   PCP NOTES >>>>>>>>>>>>>>>>>>>>>> 06/23/2015   Morbid obesity ([place]) 02/24/2015   Bradycardia 11/19/2013   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   Palpitations 04/19/2013   Annual physical exam 12/07/2011   R calf larger than L, chronic, U/S (-) for DVT 11-2011 12/07/2011   Osteoarthritis 01/30/2008   Hyperlipidemia associated with type 2 diabetes mellitus ([place]) 12/19/2007   Hypertension associated with diabetes ([place]) 12/19/2007   ERECTILE DYSFUNCTION 01/28/2007   GERD 01/28/2007    Immunization History  Administered Date(s) Administered   Fluad Quad(high Dose 65+) 06/20/2019, 10/04/2020   H1N1 11/12/2008   Influenza Split 07/17/2014, 07/31/2018   Influenza Whole 08/04/2010   Influenza,inj,Quad PF,6+ Mos 06/23/2015   Influenza-Unspecified 07/23/2013, 07/07/2016, 07/20/2017   Moderna Sars-Covid-2 Vaccination 03/16/2020, 04/14/2020   Pneumococcal Conjugate-13 08/12/2014   Pneumococcal Polysaccharide-23 11/12/2008, 05/28/2019   Td 12/19/2007   Tdap 04/04/2018   Zoster Recombinat (Shingrix) 12/20/2018, 05/12/2019   Zoster, Live 10/24/2011    Conditions to be addressed/monitored: HTN, HLD, DMII, and gout / OA; T-Cell lymphoma angioimmunoblastic (in remission) LV dysfunction vs. CHF  There are no care plans that you recently modified to display for this patient.    Medication Assistance: None required.  Patient affirms current coverage meets needs.  [facility]'s [facility] pharmacy [facility]  [id number] [place] [place] [address]Phone: [phone]: [phone]   Follow Up:  Patient agrees to Care Plan and Follow-up.  Plan: Telephone follow up appointment with clinical pharmacist in 2 months   TCherre Robins PharmD Clinical Pharmacist [phone]

## 2021-11-01 DIAGNOSIS — M5451 Vertebrogenic low back pain: Secondary | ICD-10-CM | POA: Diagnosis not present

## 2021-11-01 DIAGNOSIS — M25552 Pain in left hip: Secondary | ICD-10-CM | POA: Diagnosis not present

## 2021-11-08 DIAGNOSIS — M5451 Vertebrogenic low back pain: Secondary | ICD-10-CM | POA: Diagnosis not present

## 2021-11-08 DIAGNOSIS — M25552 Pain in left hip: Secondary | ICD-10-CM | POA: Diagnosis not present

## 2021-11-15 DIAGNOSIS — M25552 Pain in left hip: Secondary | ICD-10-CM | POA: Diagnosis not present

## 2021-11-21 ENCOUNTER — Other Ambulatory Visit: Payer: Self-pay | Admitting: Internal Medicine

## 2021-11-22 DIAGNOSIS — E1169 Type 2 diabetes mellitus with other specified complication: Secondary | ICD-10-CM

## 2021-11-22 DIAGNOSIS — I5020 Unspecified systolic (congestive) heart failure: Secondary | ICD-10-CM | POA: Diagnosis not present

## 2021-11-22 DIAGNOSIS — E1159 Type 2 diabetes mellitus with other circulatory complications: Secondary | ICD-10-CM

## 2021-11-22 DIAGNOSIS — I152 Hypertension secondary to endocrine disorders: Secondary | ICD-10-CM | POA: Diagnosis not present

## 2021-11-22 DIAGNOSIS — E785 Hyperlipidemia, unspecified: Secondary | ICD-10-CM

## 2021-12-01 ENCOUNTER — Encounter: Payer: Self-pay | Admitting: Internal Medicine

## 2021-12-01 DIAGNOSIS — D3141 Benign neoplasm of right ciliary body: Secondary | ICD-10-CM | POA: Diagnosis not present

## 2021-12-01 DIAGNOSIS — H52203 Unspecified astigmatism, bilateral: Secondary | ICD-10-CM | POA: Diagnosis not present

## 2021-12-01 DIAGNOSIS — H2513 Age-related nuclear cataract, bilateral: Secondary | ICD-10-CM | POA: Diagnosis not present

## 2021-12-01 DIAGNOSIS — E119 Type 2 diabetes mellitus without complications: Secondary | ICD-10-CM | POA: Diagnosis not present

## 2021-12-01 LAB — HM DIABETES EYE EXAM

## 2021-12-14 ENCOUNTER — Encounter: Payer: Self-pay | Admitting: Internal Medicine

## 2021-12-14 ENCOUNTER — Ambulatory Visit (INDEPENDENT_AMBULATORY_CARE_PROVIDER_SITE_OTHER): Payer: HMO | Admitting: Internal Medicine

## 2021-12-14 VITALS — BP 132/84 | HR 62 | Temp 98.2°F | Resp 18 | Ht 72.0 in | Wt 304.0 lb

## 2021-12-14 DIAGNOSIS — I5022 Chronic systolic (congestive) heart failure: Secondary | ICD-10-CM

## 2021-12-14 DIAGNOSIS — E1169 Type 2 diabetes mellitus with other specified complication: Secondary | ICD-10-CM

## 2021-12-14 DIAGNOSIS — Z800 Family history of malignant neoplasm of digestive organs: Secondary | ICD-10-CM

## 2021-12-14 DIAGNOSIS — Z23 Encounter for immunization: Secondary | ICD-10-CM

## 2021-12-14 DIAGNOSIS — M109 Gout, unspecified: Secondary | ICD-10-CM

## 2021-12-14 DIAGNOSIS — Z0001 Encounter for general adult medical examination with abnormal findings: Secondary | ICD-10-CM

## 2021-12-14 DIAGNOSIS — E785 Hyperlipidemia, unspecified: Secondary | ICD-10-CM | POA: Diagnosis not present

## 2021-12-14 DIAGNOSIS — Z0000 Encounter for general adult medical examination without abnormal findings: Secondary | ICD-10-CM

## 2021-12-14 LAB — TSH: TSH: 1.28 u[IU]/mL (ref 0.35–5.50)

## 2021-12-14 LAB — LIPID PANEL
Cholesterol: 177 mg/dL (ref 0–200)
HDL: 40.9 mg/dL (ref >39.00)
NonHDL: 135.78
Total CHOL/HDL Ratio: 4
Triglycerides: 398 mg/dL — ABNORMAL HIGH (ref 0.0–149.0)
VLDL: 79.6 mg/dL — ABNORMAL HIGH (ref 0.0–40.0)

## 2021-12-14 LAB — HEMOGLOBIN A1C: Hgb A1c MFr Bld: 6.7 % — ABNORMAL HIGH (ref 4.6–6.5)

## 2021-12-14 LAB — LDL CHOLESTEROL, DIRECT: Direct LDL: 83 mg/dL

## 2021-12-14 LAB — URIC ACID: Uric Acid, Serum: 6.2 mg/dL (ref 4.0–7.8)

## 2021-12-14 NOTE — Patient Instructions (Signed)
Check the  blood pressure regularly BP GOAL is between 110/65 and  135/85. If it is consistently higher or lower, let me know  For pain: First try Tylenol 500 mg 1 to 2 tablets 3 times a day If you still have pain, okay to take a meloxicam once daily now and then.  Always with food on your stomach   GO TO THE LAB : Get the blood work     [place], [place] back for a checkup in 4 to 5 months  If you have a living will, it is a good idea to send [place] a copy to be scanned in your chart

## 2021-12-14 NOTE — Progress Notes (Signed)
Subjective:    Patient ID: [name] [name], male    DOB: 1940-11-03, 81 y.o.   MRN: [id number]  DOS:  12/14/2021 Type of visit - description: cpx  Since the last office visit, saw hematology and cardiology.  Notes reviewed. States he is doing well. Denies lower extremity edema Continue with back pain and other joint aches, apparently taking meloxicam frequently.  Wt Readings from Last 3 Encounters:  12/14/21 (!) 304 lb (137.9 kg)  10/04/21 295 lb 8 oz (134 kg)  09/27/21 298 lb 12.8 oz (135.5 kg)     Review of Systems  Other than above, a 14 point review of systems is negative    Past Medical History:  Diagnosis Date   Arthritis    Complication of anesthesia     had spinal with knee surgeries  -"heart rate too low" for general   Detached retina, left    L eye normal vision, reports that he has had a retina procedure in a doctor's office at [facility]    Diabetes mellitus without complication ([place])    Dysrhythmia    slight irregular rate   GERD (gastroesophageal reflux disease)    hx no problems now   H/O echocardiogram 2014   History of hiatal hernia    ?20 yrs ago   Hyperopia 2016   Hypertension    Lumbar stenosis with neurogenic claudication    MG, ocular (myasthenia gravis) ([place])    ? of L eye,    Prediabetes    Presbyopia OU 2016    Past Surgical History:  Procedure Laterality Date   COLONOSCOPY     HERNIA REPAIR     bilateral inguinal hernia   HERNIA REPAIR     umbilical   IR IMAGING GUIDED PORT INSERTION  09/16/2019   IR REMOVAL TUN ACCESS W/ PORT W/O FL MOD SED  03/04/2020   JOINT REPLACEMENT  2009   right knee   LUMBAR LAMINECTOMY/DECOMPRESSION MICRODISCECTOMY N/A 06/06/2017   Procedure: Decompression L3-5, insitu fusion L3-5 ;  Surgeon: Melina Schools, MD;  Location: [facility];  Service: Orthopedics;  Laterality: N/A;   LYMPH NODE BIOPSY Right 09/01/2019   Procedure: CERVICAL LYMPH NODE BIOPSY;  Surgeon: [name], MD;  Location: [facility];  Service: ENT;   Laterality: Right;   MIDDLE EAR SURGERY Right    ear -patched hole in ear drum   RIGHT/LEFT HEART CATH AND CORONARY ANGIOGRAPHY N/A 11/11/2019   Procedure: RIGHT/LEFT HEART CATH AND CORONARY ANGIOGRAPHY;  Surgeon: Jolaine Artist, MD;  Location: [facility];  Service: Cardiovascular;  Laterality: N/A;   TOTAL KNEE ARTHROPLASTY  09/10/2012   Procedure: TOTAL KNEE ARTHROPLASTY;  Surgeon: Tobi Bastos, MD;  Location: [facility];  Service: Orthopedics;  Laterality: Left;   TOTAL SHOULDER ARTHROPLASTY Right 06/22/2016   Procedure: RIGHT TOTAL SHOULDER ARTHROPLASTY;  Surgeon: [name], MD;  Location: [facility];  Service: Orthopedics;  Laterality: Right;   Social History   Socioeconomic History   Marital status: Married    Spouse name: Not on file   Number of children: 4   Years of education: Not on file   Highest education level: Not on file  Occupational History   Occupation: [employer]-- retired 2010  Tobacco Use   Smoking status: Former    Packs/day: 0.50    Years: 2.00    Pack years: 1.00    Types: Cigarettes    Quit date: 06/09/1966    Years since quitting: 55.5   Smokeless  tobacco: Never  Vaping Use   Vaping Use: Never used  Substance and Sexual Activity   Alcohol use: No    Alcohol/week: 0.0 standard drinks    Comment: none   Drug use: No   Sexual activity: Yes    Partners: Female  Other Topics Concern   Not on file  Social History Narrative   married, 2nd wife at a nursing home, memory unit   Lives alone , lives independently     2 living children, lost 2 kids     Social Determinants of Health   Financial Resource Strain: Low Risk    Difficulty of Paying Living Expenses: Not hard at all  Food Insecurity: No Food Insecurity   Worried About Charity fundraiser in the Last Year: Never true   Arboriculturist in the Last Year: Never true  Transportation Needs: No Transportation Needs   Lack of Transportation (Medical): No   Lack of Transportation  (Non-Medical): No  Physical Activity: Insufficiently Active   Days of Exercise per Week: 2 days   Minutes of Exercise per Session: 10 min  Stress: Not on file  Social Connections: Not on file  Intimate Partner Violence: Not on file    Current Outpatient Medications  Medication Instructions   allopurinol (ZYLOPRIM) 300 MG tablet TAKE 1 TABLET BY MOUTH EVERY DAY   aspirin 81 mg, Oral, Daily   carvedilol (COREG) 12.5 MG tablet TAKE 1 TABLET BY MOUTH 2 TIMES DAILY.   diphenhydramine-acetaminophen (TYLENOL PM) 25-500 MG TABS tablet 1 tablet, Oral, At bedtime PRN   Fish Oil 1,000 mg, Oral, Daily   furosemide (LASIX) 40 mg, Oral, Daily PRN   hydrochlorothiazide (HYDRODIURIL) 25 MG tablet TAKE 1 TABLET BY MOUTH EVERY DAY   losartan (COZAAR) 100 mg, Oral, Daily   meloxicam (MOBIC) 7.5 mg, Oral, Daily   metFORMIN (GLUCOPHAGE) 500 MG tablet TAKE 1 TABLET BY MOUTH EVERY DAY WITH BREAKFAST   potassium chloride (KLOR-CON M10) 10 MEQ tablet 10 mEq, Oral, Daily   rosuvastatin (CRESTOR) 10 MG tablet TAKE 1 TABLET BY MOUTH EVERY DAY   spironolactone (ALDACTONE) 12.5 mg, Oral, Daily       Objective:   Physical Exam BP 132/84 (BP Location: Left Arm, Patient Position: Sitting, Cuff Size: Normal)    Pulse 62    Temp 98.2 F (36.8 C) (Oral)    Resp 18    Ht 6' (1.829 m)    Wt (!) 304 lb (137.9 kg)    SpO2 97%    BMI 41.23 kg/m  General: Well developed, NAD, BMI noted Neck: No  thyromegaly  HEENT:  Normocephalic . Face symmetric, atraumatic Lungs:  CTA B Normal respiratory effort, no intercostal retractions, no accessory muscle use. Heart: RRR,  no murmur.  Abdomen:  Not distended, soft, non-tender. No rebound or rigidity.   Lower extremities: no pretibial edema bilaterally  Skin: Exposed areas without rash. Not pale. Not jaundice Neurologic:  alert & oriented X3.  Speech normal, gait assisted by cane Strength symmetric and appropriate for age.  Psych: Cognition and judgment appear intact.   Cooperative with normal attention span and concentration.  Behavior appropriate. No anxious or depressed appearing.     Assessment      Assessment DM + Neuropathy Dx 01-2021 HTN Hyperlipidemia Gout  Morbid obesity Snoring  CV: -Palpitations, PVCs, h/o bradycardia,s/p. Dr Adin Hector day monitor, echo 2014: Normal LV, some LVH  - New onset acute syst CHF 10/2019>>  S/p catheterization 11/11/2019: No CAD:  Normal EF. 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 HTN: On carvedilol, HCTZ, Lasix, losartan, potassium.  Aldactone was added by cardiology and since then BPs are great.No change Lymphoma: Saw hematology 10/04/2021, s/p treatment w/  chemotherapy and targeted therapy. Still on remission. CHF: Saw cardiology 09/27/2021, BP was slightly elevated, added spironolactone, f/u BMP ok. Rx ECHO (not done).  Weight gain noted, no edema. Encouraged daily weights and watch carbohydrate intake Hyperlipidemia:On Crestor, check FLP Gout: No recent events, on allopurinol, last LFTs normal, check uric acid. Morbid obesity: Counseled RTC 4 to 5 months   In addition to CPX, I addressed all his chronic medical problems and review the chart including other practitioners notes.  This visit occurred during the SARS-CoV-2 public health emergency.  Safety protocols were in place, including screening questions prior to the visit, additional usage of staff PPE, and extensive cleaning of exam room while observing appropriate contact time as indicated for disinfecting solutions.

## 2021-12-15 ENCOUNTER — Encounter: Payer: Self-pay | Admitting: Internal Medicine

## 2021-12-15 NOTE — Assessment & Plan Note (Addendum)
Here for CPX DM: Diet controlled, last A1c 6.7, recheck labs. HTN: On carvedilol, HCTZ, Lasix, losartan, potassium.  Aldactone was added by cardiology and since then BPs are great.No change Lymphoma: Saw hematology 10/04/2021, s/p treatment w/  chemotherapy and targeted therapy. Still on remission. CHF: Saw cardiology 09/27/2021, BP was slightly elevated, added spironolactone, f/u BMP ok. Rx ECHO (not done).  Weight gain noted, no edema. Encouraged daily weights and watch carbohydrate intake Hyperlipidemia:On Crestor, check FLP Gout: No recent events, on allopurinol, last LFTs normal, check uric acid. Morbid obesity: Counseled RTC 4 to 5 months

## 2021-12-15 NOTE — Assessment & Plan Note (Signed)
-   Td 2019 - pneumonia shot: 2010;  Prevnar 2015. 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 Prostate cancer screening:   no further screening -Labs: FLP, A1c, TSH -Diet and exercise: discussed  - ACP: Advised to send a copy

## 2021-12-28 ENCOUNTER — Other Ambulatory Visit: Payer: Self-pay

## 2021-12-28 ENCOUNTER — Ambulatory Visit: Payer: HMO | Admitting: Podiatry

## 2021-12-28 ENCOUNTER — Encounter: Payer: Self-pay | Admitting: Podiatry

## 2021-12-28 DIAGNOSIS — L608 Other nail disorders: Secondary | ICD-10-CM

## 2021-12-28 DIAGNOSIS — E1169 Type 2 diabetes mellitus with other specified complication: Secondary | ICD-10-CM

## 2021-12-28 DIAGNOSIS — M79674 Pain in right toe(s): Secondary | ICD-10-CM | POA: Diagnosis not present

## 2021-12-28 DIAGNOSIS — B351 Tinea unguium: Secondary | ICD-10-CM

## 2021-12-28 DIAGNOSIS — M79675 Pain in left toe(s): Secondary | ICD-10-CM | POA: Diagnosis not present

## 2021-12-28 NOTE — Progress Notes (Signed)
This patient returns to my office for at risk foot care.  This patient requires this care by a professional since this patient will be at risk due to having diabetes type 2. This patient is having problems with his left knee and hip.  This patient is unable to cut nails himself since the patient cannot reach his nails.These nails are painful walking and wearing shoes.  This patient presents for at risk foot care today.  General Appearance  Alert, conversant and in no acute stress.  Vascular  Dorsalis pedis  are palpable  bilaterally. Posterior tibial pulses are absent  B/L. Capillary return is within normal limits  bilaterally. Cold feet  Bilaterally. Absent hair  B/L.  Neurologic  Senn-Weinstein monofilament wire test within normal limits/diminished   bilaterally. Muscle power within normal limits bilaterally.  Nails Thick disfigured discolored nails with subungual debris  Hallux nails. No evidence of bacterial infection or drainage bilaterally.  Orthopedic  No limitations of motion  feet .  No crepitus or effusions noted.  No bony pathology or digital deformities noted.  HAV  B/L.  Skin  normotropic skin with no porokeratosis noted bilaterally.  No signs of infections or ulcers noted.     Onychomycosis  Pain in right toes  Pain in left toes  Consent was obtained for treatment procedures.   Mechanical debridement of nails 1-5  bilaterally performed with a nail nipper.  Filed with dremel without incident.    Return office visit     3 months.                Told patient to return for periodic foot care and evaluation due to potential at risk complications.   Sayler Mickiewicz DPM  

## 2021-12-29 ENCOUNTER — Ambulatory Visit (INDEPENDENT_AMBULATORY_CARE_PROVIDER_SITE_OTHER): Payer: HMO | Admitting: Pharmacist

## 2021-12-29 DIAGNOSIS — E785 Hyperlipidemia, unspecified: Secondary | ICD-10-CM

## 2021-12-29 DIAGNOSIS — E1159 Type 2 diabetes mellitus with other circulatory complications: Secondary | ICD-10-CM

## 2021-12-29 DIAGNOSIS — I152 Hypertension secondary to endocrine disorders: Secondary | ICD-10-CM

## 2021-12-29 DIAGNOSIS — E1169 Type 2 diabetes mellitus with other specified complication: Secondary | ICD-10-CM

## 2021-12-29 DIAGNOSIS — I5022 Chronic systolic (congestive) heart failure: Secondary | ICD-10-CM

## 2021-12-30 ENCOUNTER — Other Ambulatory Visit: Payer: Self-pay | Admitting: Internal Medicine

## 2022-01-02 NOTE — Chronic Care Management (AMB) (Signed)
Chronic Care Management Pharmacy Note  01/02/2022 Name:  [name] [name] MRN:  [id number] DOB:  October 12, 1941  Summary: Patient reported he has about 2 weeks of all his medications though it looks like some should have been filled at the beginning of March.  Reviewed medications list and last labs.  Triglycerides were elevated and discussed limiting fried foods and other foods that can increase triglycerides.   Plan: Will continue to follow adherence over the next 4 to 6 weeks with interventions as needed. Recommended he start using weekly pill container to help with adherence.   Subjective: [name] [name] is an 81 y.o. year old male who is a primary patient of [name], [name], MD.  The CCM team was consulted for assistance with disease management and care coordination needs.    Engaged with patient by telephone for follow up visit in response to provider referral for pharmacy case management and/or care coordination services.   Consent to Services:  The patient was given information about Chronic Care Management services, agreed to services, and gave verbal consent prior to initiation of services.  Please see initial visit note for detailed documentation.   Patient Care Team: Colon Branch, MD as PCP - General [name], [name], MD as PCP - Cardiology (Cardiology) [name], MD as Consulting Physician (Gastroenterology) [name], MD as Consulting Physician (Orthopedic Surgery) [name], MD as Consulting Physician (Physical Medicine and Rehabilitation) Marin Olp Rudell Cobb, MD as Medical Oncologist (Oncology) [name], Crest as Referring Physician (Optometry) [name], RPH-CPP (Pharmacist) Bensimhon, [name], MD as Consulting Physician (Cardiology)  Recent office visits: 12/14/2021 - PCP (Dr [name]) Follow up chronic conditions. Labs checked. No medication changes noted. 07/08/2021 - PCP (Dr [name]) f/u HTN and diabetes. Labs ordered Patient has been out of furosemide and was  not sure he needed to restart. Dr [name] recommended restart furosemide 21m daily and to recheck BMP in 2 to 3 weeks.    Recent consult visits: 10/04/2021 - Hem/Onc (Dr [name] Seen for antioimmuniblastic lymphoma. No medicaiton changes.  09/28/2021 - Podiatry (Dr MPrudence Davidson seen for pain due to onychomycosis. Mechanical debridement performed.  09/27/2021 - Cardio (Dr BHaroldine Laws Follow up nonischemic cardiomyopathy / CHF. Started spironolactone 269m- take 0.5 tablet daily 08/31/2021 - [facility] (Flower, PAC) Seen for left hip pain - initial consult. Xray ordered. referred to physical therapy.  06/22/19 Podiatry (Dr MaPrudence DavidsonMechanical debridement of toe nails. Recheck 3 months.  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  Hospital visits: None in previous 6 months  Objective:  Lab Results  Component Value Date   CREATININE 1.17 10/04/2021   CREATININE 1.12 07/27/2021   CREATININE 1.14 04/04/2021    Lab Results  Component Value Date   HGBA1C 6.7 (H) 12/14/2021   Last diabetic Eye exam:  Lab Results  Component Value Date/Time   HMDIABEYEEXA No Retinopathy 12/01/2021 12:00 AM    Last diabetic Foot exam:  Lab Results  Component Value Date/Time   HMDIABFOOTEX Done 06/12/2019 12:00 AM        Component Value Date/Time   CHOL 177 12/14/2021 0923   TRIG 398.0 (H) 12/14/2021 0923   TRIG 247 (HH) 08/30/2006 1117   HDL 40.90 12/14/2021 0923   CHOLHDL 4 12/14/2021 0923   VLDL 79.6 (H) 12/14/2021 097829  LDLCALC 39 10/14/2019 0526   LDLDIRECT 83.0 12/14/2021 0923    Hepatic Function Latest Ref Rng & Units 10/04/2021 04/04/2021 12/13/2020  Total Protein 6.5 - 8.1 g/dL 6.9 6.6 6.9  Albumin 3.5 - 5.0 g/dL 4.5 4.5 4.4  AST 15 - 41 U/L 12(L) 13(L) 14(L)  ALT 0 - 44 U/L 17 13 13   Alk Phosphatase 38  - 126 U/L 72 76 79  Total Bilirubin 0.3 - 1.2 mg/dL 0.7 0.7 0.6  Bilirubin, Direct 0.0 - 0.3 mg/dL - - -    Lab Results  Component Value Date/Time   TSH 1.28 12/14/2021 09:23 AM   TSH 2.07 09/23/2019 09:55 AM    CBC Latest Ref Rng & Units 10/04/2021 04/04/2021 12/13/2020  WBC 4.0 - 10.5 K/uL 8.2 6.6 7.5  Hemoglobin 13.0 - 17.0 g/dL 13.8 13.1 12.8(L)  Hematocrit 39.0 - 52.0 % 40.1 38.0(L) 37.5(L)  Platelets 150 - 400 K/uL 264 225 229    No results found for: VD25OH  Clinical ASCVD: No  The ASCVD Risk score (Arnett DK, et al., 2019) failed to calculate for the following reasons:   The patient has a prior MI or stroke diagnosis     Social History   Tobacco Use  Smoking Status Former   Packs/day: 0.50   Years: 2.00   Pack years: 1.00   Types: Cigarettes   Quit date: 06/09/1966   Years since quitting: 55.6  Smokeless Tobacco Never   BP Readings from Last 3 Encounters:  12/14/21 132/84  10/04/21 (!) 155/84  09/27/21 (!) 144/76   Pulse Readings from Last 3 Encounters:  12/14/21 62  10/04/21 (!) 58  09/27/21 84   Wt Readings from Last 3 Encounters:  12/14/21 (!) 304 lb (137.9 kg)  10/04/21 295 lb 8 oz (134 kg)  09/27/21 298 lb 12.8 oz (135.5 kg)    Assessment: Review of patient past medical history, allergies, medications, health status, including review of consultants reports, laboratory and other test data, was performed as part of comprehensive evaluation and provision of chronic care management services.   SDOH:  (Social Determinants of Health) assessments and interventions performed:  SDOH Interventions    Flowsheet Row Most Recent Value  SDOH Interventions   Financial Strain Interventions Intervention Not Indicated       CCM Care Plan  Allergies  Allergen Reactions   Doxycycline Rash    Medications Reviewed Today     Reviewed by [name], RPH-CPP (Pharmacist) on 12/29/21 at 56  Med List Status: <None>   Medication Order Taking? Sig  Documenting Provider Last Dose Status Informant  allopurinol (ZYLOPRIM) 300 MG tablet [id number] Yes TAKE 1 TABLET BY MOUTH EVERY DAY [name], MD Taking Active   aspirin 81 MG chewable tablet [id number] Yes Chew 81 mg by mouth daily. [provider] Taking Active   carvedilol (COREG) 12.5 MG tablet [id number] Yes TAKE 1 TABLET BY MOUTH 2 TIMES DAILY. Colon Branch, MD Taking Active   diphenhydramine-acetaminophen (TYLENOL PM) 25-500 MG TABS tablet [id number] Yes Take 1 tablet by mouth at bedtime as needed. [provider] Taking Active   furosemide (LASIX) 40 MG tablet [id number] Yes Take 1 tablet (40 mg total) by mouth daily as needed for fluid or edema. Colon Branch, MD Taking Active   hydrochlorothiazide (HYDRODIURIL) 25 MG tablet [id number] Yes TAKE 1 TABLET BY MOUTH EVERY DAY Colon Branch, MD Taking Active   losartan (COZAAR) 100 MG tablet [id number] Yes Take 1 tablet (100 mg total)  by mouth daily. Colon Branch, MD Taking Active            Med Note [facility], Kayde Atkerson B   Thu Dec 29, 2021 10:10 AM) Per patient still has some 29m and taking 2 tabs.  losartan (COZAAR) 100 MG tablet [id number] losartan 100 mg tablet  TAKE 1 TABLET BY MOUTH EVERY DAY [provider] Taking Active   meloxicam (MOBIC) 7.5 MG tablet [id number] Take 1 tablet by mouth daily. [provider] Taking Active            Med Note (Antony Contras Javarion Douty B   Thu Dec 29, 2021 10:05 AM) Taking as needed  metFORMIN (GLUCOPHAGE) 500 MG tablet [id number] TAKE 1 TABLET BY MOUTH EVERY DAY WITH BREAKFAST PColon Branch MD Taking Active   Omega-3 Fatty Acids (FISH OIL) 1000 MG CAPS [id number] Take 1,000 mg by mouth daily. [provider] Taking Active Self           Med Note (HILL, TIFFANY A   Fri May 28, 2020 10:20 AM)    potassium chloride (KLOR-CON M10) 10 MEQ tablet [id number] Take 1 tablet (10 mEq total) by mouth daily. PColon Branch MD Taking Active   rosuvastatin (CRESTOR) 10 MG tablet  [id number] TAKE 1 TABLET BY MOUTH EVERY DAY Bensimhon, DShaune Pascal MD Taking Active   spironolactone (ALDACTONE) 25 MG tablet [id number] Take 0.5 tablets (12.5 mg total) by mouth daily. Bensimhon, DShaune Pascal MD Taking Active            Med Note (CANTER, KCheri Rous  Wed Dec 14, 2021  8:48 AM) Ran out 2 days ago, has to pick up at pharmacy            Patient Active Problem List   Diagnosis Date Noted   Pain due to onychomycosis of toenails of both feet 03/14/2021   Pincer nail deformity 045/36/4680  Systolic heart failure ([facility] 11/07/2019   Hyponatremia 11/07/2019   Acute CHF (congestive heart failure) ([place] 10/09/2019   Elevated troponin 10/09/2019   Antineoplastic chemotherapy induced pancytopenia ([place] 10/09/2019   Anemia due to antineoplastic chemotherapy 10/06/2019   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   Stasis dermatitis of both legs 06/21/2019   Diabetes mellitus, type II ([place] 07/09/2017   Status post lumbar surgery 06/06/2017   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/22/2016   PVC's (premature ventricular contractions) 11/24/2015   PCP notes >>>>>>>>>>>>>>> 06/23/2015   Morbid obesity ([place] 02/24/2015   Bradycardia 11/19/2013   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   Palpitations 04/19/2013   Annual physical exam 12/07/2011   R calf larger than L, chronic, U/S (-) for DVT 11-2011 12/07/2011   Osteoarthritis 01/30/2008   Hyperlipidemia associated with type 2 diabetes mellitus ([place] 12/19/2007   Hypertension associated with diabetes ([place] 12/19/2007   ERECTILE DYSFUNCTION 01/28/2007   GERD 01/28/2007    Immunization History  Administered Date(s) Administered   Fluad Quad(high Dose 65+) 06/20/2019, 10/04/2020   H1N1 11/12/2008   Influenza Split 07/17/2014, 07/31/2018, 10/18/2021   Influenza Whole 08/04/2010   Influenza, High Dose Seasonal PF 10/18/2021   Influenza,inj,Quad PF,6+ Mos 06/23/2015   Influenza-Unspecified 07/23/2013, 07/07/2016, 07/20/2017   Moderna  Sars-Covid-2 Vaccination 03/16/2020, 04/14/2020   PNEUMOCOCCAL CONJUGATE-20 12/14/2021   Pneumococcal Conjugate-13 08/12/2014   Pneumococcal Polysaccharide-23 11/12/2008, 05/28/2019   Td 12/19/2007   Tdap 04/04/2018   Zoster Recombinat (Shingrix) 12/20/2018, 05/12/2019   Zoster, Live 10/24/2011    Conditions to be addressed/monitored: HTN, HLD,  DMII, and gout / OA; T-Cell lymphoma angioimmunoblastic (in remission) LV dysfunction vs. CHF  Care Plan : General Pharmacy (Adult)  Updates made by [name], RPH-CPP since 01/02/2022 12:00 AM     Problem: Pharmacy Care Plan - Medication and Chronic Care Management   Priority: High  Onset Date: 04/13/2021     Long-Range Goal: Long Range goals of CCM and medication management   Start Date: 04/13/2021  Recent Progress: On track  Priority: High  Note:   Current Barriers:  Unable to maintain control of HTN Does not adhere to prescribed medication regimen Does not maintain contact with provider office  Pharmacist Clinical Goal(s):  Over the next 90 days, patient will achieve control of HTN as evidenced by BP < 140/90 maintain control of hyperlipidemia and CHF as evidenced by maintaining LDL <100 and take medications as prescribed  adhere to prescribed medication regimen as evidenced by refill history and patient self report  through collaboration with PharmD and provider.   Interventions: 1:1 collaboration with Colon Branch, MD regarding development and update of comprehensive plan of care as evidenced by provider attestation and co-signature Inter-disciplinary care team collaboration (see longitudinal plan of care) Comprehensive medication review performed; medication list updated in electronic medical record  Hypertension  BP goal <140/90 Patient is checking BP at home daily. Reports blood pressure today was 135/70 Denies dizziness Current regimen:  hydrochlorothiazide 4m daily  Spironolactone 23m- take 0.5 tablet = 12.31m23mdaily Losartan 100m731mily  Carvedilol 12.31mg 61mce a day Furosemide 40mg 44my as needed for fluid or edema (restarted 07/08/2021) Interventions: Discussed blood pressure goal Coordinated with pharmacy to check refill history - patient has filled losartan for 90 day supply 10/31/2021. Per patient he has about 2 weeks of all other medications on hand Limit sodium intake to < 2300mg p12may   HFpEF / suspect transient LV dysfunction due to critical illness: Controlled; goal: limit edema Followed by Dr BensimhHaroldine Laws appt 09/27/2021 Last EF was 60-65% (12/22/2019) - was as low as 40-45% when receiving chemo in 2021 Current regimen:  Carvedilol 12.31mg twi3ma day  Spironolactone 231mg - t37m0.5 tablet = 12.31mg daily54mtarted 09/27/2021) Furosemide 40mg daily531mneeded for swelling in legs Potassium chloride 10 mEq - take 1 tablet daily Losartan 50mg - take60mablets = 100mg daily I79mventions: Continue current therapy for CHF. Could consider increasing spironolactone to 231mg / day in131mure and stopping hydrochlorothiazide (and possibly potassium supplement).  Maintain current  regimen.  Limit sodium intake to < 2300mg per day  13mrlipidemia LDL goal < 100, Triglyceride goal < 150  LDL is at goal but triglycerides are still elevated.   Lab Results  Component Value Date   TRIG 398.0 (H) 12/14/2021   TRIG 282.0 (H) 02/07/2021   TRIG 257.0 (H) 06/03/2020   Lab Results  Component Value Date   LDLDIRECT 83.0 12/14/2021   LDLDIRECT 76.0 02/07/2021   LDLDIRECT 72.0 06/03/2020  Current regimen:  Rosuvastatin 10mg daily Fish58m 1000mg daily Inter86mions: Discussed LDL goal Discussed limiting fried foods other foods that could increase triglycerides Reviewed medications for lipids - continue current regimen  Diabetes Controlled; A1c goal <7% Does not currently monitor blood glucose at home Current regimen:  Metformin 500mg twice a day 109mrventions: Continue current  regimen  Hip Pain:  Pain has improved.  Attending physical therapy 2 to 3 times per week  Current therapy:  Meloxicam 7.31mg daily  Interve51mons: (none today)   Medication  management [facility] Adherence improving.  Interventions Comprehensive medication review performed. Continue current medication management strategy Reviewed recent refill history and assessed adherence.  Report any questions or concerns to PharmD and/or provider(s)  Patient Goals/Self-Care Activities Over the next 90 days, patient will:  take medications as prescribed. Consider using weekly pill container as a reminder to take medications.  Get pill cutter to help with splitting spironolactone tablets.  collaborate with provider on medication access solutions engage in dietary modifications by limiting intake of sodium to <2332m per day Try to be active every day as able - walking or physical therapy.   Follow Up Plan: Telephone follow up appointment with care management team member scheduled for:  2 months         Medication Assistance: None required.  Patient affirms current coverage meets needs.  [id number] [place] [id number]: [phone]: [phone]  Follow Up:  Patient agrees to Care Plan and Follow-up.  Plan: Telephone follow up appointment with clinical pharmacist in 2 months - to check adherence  TaCherre RobinsPharmD Clinical Pharmacist [facility]Carol[facility]se[phone]

## 2022-01-02 NOTE — Patient Instructions (Addendum)
Mr. Fikes ?It was a pleasure speaking with you today.  ?I have attached a summary of our visit today and information about your health goals.  ? ?Patient Goals/Self-Care Activities ? ?take medications as prescribed. Consider using weekly pill container as a reminder to take medications.  ?Get pill cutter to help with splitting spironolactone tablets.  ?collaborate with provider on medication access solutions ?engage in dietary modifications by limiting intake of sodium to '2300mg'$  per day ?Try to be active every day as able - walking or physical therapy.  ? ?Our next appointment is by telephone on February 07, 2022 at 10am ? ?Please call the care guide team at [phone] if you need to cancel or reschedule your appointment.  ? ? ?If you have any questions or concerns, please feel free to contact me either at the phone number below or with a MyChart message.  ? ?Keep up the good work! ? ?[name], PharmD ?Clinical Pharmacist ?[facility] ?[place] ?[phone] (direct line)  ?[phone] (main office number) ? ? ?Chronic Care Management CARE PLAN ? ?Hypertension / Heart / swelling: ?BP Readings from Last 3 Encounters:  ?12/14/21 132/84  ?10/04/21 (!) 155/84  ?09/27/21 (!) 144/76  ? ?Pharmacist Clinical Goal(s): ?Over the next 180 days, patient will work with PharmD and providers to achieve BP goal <140/90 and minimize edema and other symptoms of heart failure ?Current regimen:  ?hydrochlorothiazide '25mg'$  daily  ?Spironolactone '25mg'$  - take 0.5 tablet = 12.'5mg'$  daily (started 09/27/2021) ?Losartan '100mg'$  daily ?Carvedilol 12.'5mg'$  twice a day ?Furosemide '40mg'$  daily as needed for fluid or edema  ?Interventions: ?Discussed blood pressure goal ?Patient self care activities - Over the next 180 days, patient will: ?Maintain hypertension medication regimen.  ?Limit sodium intake to < '2300mg'$  per day ?Continue medications above for blood pressure  ? ?Hyperlipidemia ?Lab Results  ?Component Value  Date/Time  ? LDLCALC 39 10/14/2019 05:26 AM  ? LDLDIRECT 83.0 12/14/2021 09:23 AM  ? ?Pharmacist Clinical Goal(s): ?Over the next 180 days, patient will work with PharmD and providers to maintain LDL goal < 100, achieve Triglyceride goal < 150  ?Current regimen:  ?Rosuvastatin '10mg'$  daily ?Fish Oil '1000mg'$  daily ?Interventions: ?Discussed LDL goal ?Discussed limiting fried foods and other foods that can increase triglycerides.  ?Patient self care activities - Over the next 180 days, patient will: ?Maintain cholesterol medication regimen.  ? ?Diabetes ?Lab Results  ?Component Value Date/Time  ? HGBA1C 6.7 (H) 12/14/2021 09:23 AM  ? HGBA1C 6.7 (H) 07/27/2021 07:51 AM  ? ?Pharmacist Clinical Goal(s): ?Over the next 180 days, patient will work with PharmD and providers to maintain A1c goal <7% ?Current regimen:  ?Metformin '500mg'$  twice a day ?Interventions: ?Discussed A1c goal ?Patient self care activities - Over the next 180 days, patient will: ?Maintain a1c less than 7% ? ?Medication management ?Pharmacist Clinical Goal(s): ?Over the next 180 days, patient will work with PharmD and providers to achieve optimal medication adherence ?[facility] ?Interventions ?Comprehensive medication review performed. ?Continue current medication management strategy ?Created list of medications and recommended time of day to take them.  ?Provided patient with day and night pill container ?Patient self care activities - Over the next 180 days, patient will: ?Focus on medication adherence by filling and taking medications appropriately  ?Take medications as prescribed ?Report any questions or concerns to PharmD and/or provider(s) ? ? ? ?The patient verbalized understanding of instructions, educational materials, and care plan provided today and agreed to receive a mailed copy of patient  instructions, educational materials, and care plan.   ? ? ?Food Choices to Lower blood glucose and triglycerides: ?  ?Avoid fried foods ?Increase  non-starchy vegetables - carrots, green bean, squash, zucchini, tomatoes, onions, peppers, spinach and other green leafy vegetables, cabbage, lettuce, cucumbers, asparagus, okra (not fried), eggplant ?Limit sugar and processed foods (cakes, cookies, ice cream, crackers and chips) ?Increase fresh fruit but limit serving sizes 1/2 cup or about the size of tennis or baseball ?Limit red meat to no more than 1-2 times per week (serving size about the size of your palm) ?Choose whole grains / lean proteins - whole wheat bread, quinoa, whole grain rice (1/2 cup), fish, chicken, [place] ?Avoid sugar and calorie containing beverages - soda, sweet tea and juice.  Choose water or unsweetened tea instead. ? ?

## 2022-01-20 DIAGNOSIS — I152 Hypertension secondary to endocrine disorders: Secondary | ICD-10-CM

## 2022-01-20 DIAGNOSIS — I5022 Chronic systolic (congestive) heart failure: Secondary | ICD-10-CM | POA: Diagnosis not present

## 2022-01-20 DIAGNOSIS — E785 Hyperlipidemia, unspecified: Secondary | ICD-10-CM

## 2022-01-20 DIAGNOSIS — E1169 Type 2 diabetes mellitus with other specified complication: Secondary | ICD-10-CM | POA: Diagnosis not present

## 2022-01-20 DIAGNOSIS — E1159 Type 2 diabetes mellitus with other circulatory complications: Secondary | ICD-10-CM

## 2022-01-26 ENCOUNTER — Other Ambulatory Visit: Payer: Self-pay | Admitting: Hematology & Oncology

## 2022-01-26 DIAGNOSIS — C8440 Peripheral T-cell lymphoma, not classified, unspecified site: Secondary | ICD-10-CM

## 2022-02-07 ENCOUNTER — Ambulatory Visit (INDEPENDENT_AMBULATORY_CARE_PROVIDER_SITE_OTHER): Payer: HMO | Admitting: Pharmacist

## 2022-02-07 DIAGNOSIS — I5022 Chronic systolic (congestive) heart failure: Secondary | ICD-10-CM

## 2022-02-07 DIAGNOSIS — E1159 Type 2 diabetes mellitus with other circulatory complications: Secondary | ICD-10-CM

## 2022-02-07 DIAGNOSIS — E1169 Type 2 diabetes mellitus with other specified complication: Secondary | ICD-10-CM

## 2022-02-07 NOTE — Chronic Care Management (AMB) (Signed)
? ? ?Chronic Care Management ?Pharmacy Note ? ?02/07/2022 ?Name:  [name] [name] MRN:  [id number] DOB:  1941-02-14 ? ?Summary: ?Patient reports home blood pressure has been 130's / 75 to 85. EMR refill reports not showing most recent refills on maintenance medications. Called [facility] and confirmed patient filled rosuvastatin, losartan, metformin and allopurinol for 90 days on 01/30/2022. He filled carvedilol for 90 days on 01/09/2022 and spironolactone for 90 days 12/14/2021. Patient had received letter in January that cost of some of his medications would increase but he states that all the medications he has pick up so far in 2023 have been $0.  ? ?Plan: ?Will continue to follow adherence over the next 6 to 8 weeks with interventions as needed. Again recommended he start using weekly pill container to help with adherence.  ? ?Subjective: ?[name] [name] is an 81 y.o. year old male who is a primary patient of [name], [name] Berthold, MD.  The CCM team was consulted for assistance with disease management and care coordination needs.   ? ?Engaged with patient by telephone for follow up visit in response to provider referral for pharmacy case management and/or care coordination services.  ? ?Consent to Services:  ?The patient was given information about Chronic Care Management services, agreed to services, and gave verbal consent prior to initiation of services.  Please see initial visit note for detailed documentation.  ? ?Patient Care Team: ?Colon Branch, MD as PCP - General ?[name], [name], MD as PCP - Cardiology (Cardiology) ?[name], MD as Consulting Physician (Gastroenterology) ?[name], MD as Consulting Physician (Orthopedic Surgery) ?[name], MD as Consulting Physician (Physical Medicine and Rehabilitation) ?[name], MD as Medical Oncologist (Oncology) ?[name], Frio as Referring Physician (Optometry) ?[name], RPH-CPP (Pharmacist) ?Bensimhon, [name], MD as Consulting Physician  (Cardiology) ? ?Recent office visits: ?12/14/2021 - PCP (Dr [name]) Follow up chronic conditions. Labs checked. No medication changes noted. ?07/08/2021 - PCP (Dr [name]) f/u HTN and diabetes. Labs ordered Patient has been out of furosemide and was not sure he needed to restart. Dr [name] recommended restart furosemide 40mg  daily and to recheck BMP in 2 to 3 weeks.  ? ? ?Recent consult visits: ?10/04/2021 - Hem/Onc (Dr Marin Olp) Seen for antioimmuniblastic lymphoma. No medicaiton changes.  ?09/28/2021 - Podiatry (Dr Prudence Davidson) seen for pain due to onychomycosis. Mechanical debridement performed.  ?09/27/2021 - Cardio (Dr [name]) Follow up nonischemic cardiomyopathy / CHF. Started spironolactone 25mg  - take 0.5 tablet daily ?08/31/2021 - [facility] (Flower, PAC) Seen for left hip pain - initial consult. Xray ordered. referred to physical therapy.  ?06/22/19 Podiatry (Dr Prudence Davidson) Mechanical debridement of toe nails. Recheck 3 months.  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 ? ?Hospital visits: ?None in previous 6 months ? ?Objective: ? ?Lab Results  ?Component Value Date  ? CREATININE 1.17 10/04/2021  ? CREATININE 1.12 07/27/2021  ? CREATININE 1.14 04/04/2021  ? ? ?Lab Results  ?Component Value Date  ? HGBA1C 6.7 (H) 12/14/2021  ? ?Last diabetic Eye exam:  ?Lab Results  ?Component Value Date/Time  ? HMDIABEYEEXA No Retinopathy 12/01/2021 12:00 AM  ?  ?Last diabetic Foot exam:  ?Lab Results  ?Component Value Date/Time  ? HMDIABFOOTEX Done 06/12/2019 12:00 AM  ?  ? ?   ?Component Value Date/Time  ?  CHOL 177 12/14/2021 0923  ? TRIG 398.0 (H) 12/14/2021 6720  ? TRIG 247 (HH) 08/30/2006 1117  ? HDL 40.90 12/14/2021 0923  ? CHOLHDL 4 12/14/2021 0923  ? VLDL 79.6 (H) 12/14/2021 9470  ? LDLCALC 39 10/14/2019 0526  ? LDLDIRECT 83.0 12/14/2021  0923  ? ? ? ?  Latest Ref Rng & Units 10/04/2021  ?  9:23 AM 04/04/2021  ?  8:57 AM 12/13/2020  ?  9:49 AM  ?Hepatic Function  ?Total Protein 6.5 - 8.1 g/dL 6.9   6.6   6.9    ?Albumin 3.5 - 5.0 g/dL 4.5   4.5   4.4    ?AST 15 - 41 U/L 12   13   14     ?ALT 0 - 44 U/L 17   13   13     ?Alk Phosphatase 38 - 126 U/L 72   76   79    ?Total Bilirubin 0.3 - 1.2 mg/dL 0.7   0.7   0.6    ? ? ?Lab Results  ?Component Value Date/Time  ? TSH 1.28 12/14/2021 09:23 AM  ? TSH 2.07 09/23/2019 09:55 AM  ? ? ? ?  Latest Ref Rng & Units 10/04/2021  ?  9:23 AM 04/04/2021  ?  8:57 AM 12/13/2020  ?  9:49 AM  ?CBC  ?WBC 4.0 - 10.5 K/uL 8.2   6.6   7.5    ?Hemoglobin 13.0 - 17.0 g/dL 13.8   13.1   12.8    ?Hematocrit 39.0 - 52.0 % 40.1   38.0   37.5    ?Platelets 150 - 400 K/uL 264   225   229    ? ? ?No results found for: VD25OH ? ?Clinical ASCVD: No  ?The ASCVD Risk score (Arnett DK, et al., 2019) failed to calculate for the following reasons: ?  The patient has a prior MI or stroke diagnosis   ? ? ?Social History  ? ?Tobacco Use  ?Smoking Status Former  ? Packs/day: 0.50  ? Years: 2.00  ? Pack years: 1.00  ? Types: Cigarettes  ? Quit date: 06/09/1966  ? Years since quitting: 55.7  ?Smokeless Tobacco Never  ? ?BP Readings from Last 3 Encounters:  ?12/14/21 132/84  ?10/04/21 (!) 155/84  ?09/27/21 (!) 144/76  ? ?Pulse Readings from Last 3 Encounters:  ?12/14/21 62  ?10/04/21 (!) 58  ?09/27/21 84  ? ?Wt Readings from Last 3 Encounters:  ?12/14/21 (!) 304 lb (137.9 kg)  ?10/04/21 295 lb 8 oz (134 kg)  ?09/27/21 298 lb 12.8 oz (135.5 kg)  ? ? ?Assessment: Review of patient past medical history, allergies, medications, health status, including review of consultants reports, laboratory and other test data, was performed as part of comprehensive evaluation and provision of chronic care management services.  ? ?SDOH:  (Social Determinants of Health) assessments and interventions performed:  ? ? ? ?CCM Care Plan ? ?Allergies  ?Allergen Reactions  ?  Doxycycline Rash  ? ? ?Medications Reviewed Today   ? ? Reviewed by [name], RPH-CPP (Pharmacist) on 02/07/22 at 1406  Med List Status: <None>  ? ?Medication Order Taking? Sig Documenting Provider Last Dose Status Informant  ?allopurinol (ZYLOPRIM) 300 MG tablet [id number] Yes TAKE 1 TABLET BY MOUTH EVERY DAY Ennever, Rudell Cobb, MD Taking Active   ?aspirin 81 MG chewable tablet [id number] Yes Chew 81 mg by mouth daily. [provider] Taking Active   ?carvedilol (COREG) 12.5 MG tablet [id number] Yes TAKE 1 TABLET  BY MOUTH 2 TIMES DAILY. Colon Branch, MD Taking Active   ?diphenhydramine-acetaminophen (TYLENOL PM) 25-500 MG TABS tablet [id number] Yes Take 1 tablet by mouth at bedtime as needed. [provider] Taking Active   ?furosemide (LASIX) 40 MG tablet [id number] Yes TAKE 1 TABLET (40 MG TOTAL) BY MOUTH DAILY AS NEEDED FOR FLUID OR EDEMA. Colon Branch, MD Taking Active   ?hydrochlorothiazide (HYDRODIURIL) 25 MG tablet [id number] Yes TAKE 1 TABLET BY MOUTH EVERY DAY Colon Branch, MD Taking Active   ?losartan (COZAAR) 100 MG tablet [id number] Yes Take 1 tablet (100 mg total) by mouth daily. Colon Branch, MD Taking Active   ?         ?Med Note Antony Contras, Kasey Ewings B   Tue Feb 07, 2022  2:05 PM)    ?meloxicam (MOBIC) 7.5 MG tablet [id number] Yes Take 1 tablet by mouth daily. [provider] Taking Active   ?         ?Med Note Antony Contras, Brycelynn Stampley B   Thu Dec 29, 2021 10:05 AM) Taking as needed  ?metFORMIN (GLUCOPHAGE) 500 MG tablet [id number] Yes TAKE 1 TABLET BY MOUTH EVERY DAY WITH BREAKFAST Colon Branch, MD Taking Active   ?Omega-3 Fatty Acids (FISH OIL) 1000 MG CAPS [id number] Yes Take 1,000 mg by mouth daily. [provider] Taking Active Self  ?         ?Med Note (HILL, TIFFANY A   Fri May 28, 2020 10:20 AM)    ?potassium chloride (KLOR-CON M10) 10 MEQ tablet [id number] Yes Take 1 tablet (10 mEq total) by mouth daily. Colon Branch, MD Taking Active   ?rosuvastatin (CRESTOR) 10 MG tablet [id number] Yes  TAKE 1 TABLET BY MOUTH EVERY DAY Bensimhon, [name], MD Taking Active   ?spironolactone (ALDACTONE) 25 MG tablet [id number] Yes Take 0.5 tablets (12.5 mg total) by mouth daily. Bensimhon, [name], MD Taking Active

## 2022-02-07 NOTE — Patient Instructions (Signed)
[name], ?It was a pleasure speaking with you  ?Below is a summary of your health goals and care plan ? ?Patient Goals/Self-Care Activities  ?take medications as prescribed. Consider using weekly pill container as a reminder to take medications.  ?Get pill cutter to help with splitting spironolactone tablets.  ?collaborate with provider on medication access solutions ?engage in dietary modifications by limiting intake of sodium to '2300mg'$  per day ?Try to be active every day as able - walking or physical therapy.  ? ? ?If you have any questions or concerns, please feel free to contact me either at the phone number below or with a MyChart message.  ? ?Keep up the good work! ? ?[name], PharmD ?Clinical Pharmacist ?[facility] ?[place] ?[phone] (direct line)  ?[phone] (main office number) ? ? ?Chronic Care Management Care Plan - updated 02/07/2022 ?Hypertension / Heart / swelling: ?BP Readings from Last 3 Encounters:  ?12/14/21 132/84  ?10/04/21 (!) 155/84  ?09/27/21 (!) 144/76  ? ?Pharmacist Clinical Goal(s): ?Over the next 180 days, patient will work with PharmD and providers to achieve BP goal <140/90 and minimize edema and other symptoms of heart failure ?Current regimen:  ?hydrochlorothiazide '25mg'$  daily  ?Spironolactone '25mg'$  - take 0.5 tablet = 12.'5mg'$  daily (started 09/27/2021) ?Losartan '100mg'$  daily ?Carvedilol 12.'5mg'$  twice a day ?Furosemide '40mg'$  daily as needed for fluid or edema  ?Interventions: ?Discussed blood pressure goal ?Patient self care activities - Over the next 180 days, patient will: ?Maintain hypertension medication regimen.  ?Limit sodium intake to < '2300mg'$  per day ?Continue medications above for blood pressure  ? ?Hyperlipidemia ?Lab Results  ?Component Value Date/Time  ? LDLCALC 39 10/14/2019 05:26 AM  ? LDLDIRECT 83.0 12/14/2021 09:23 AM  ? ?Pharmacist Clinical Goal(s): ?Over the next 180 days, patient will work with PharmD and providers to maintain LDL  goal < 100, achieve Triglyceride goal < 150  ?Current regimen:  ?Rosuvastatin '10mg'$  daily ?Fish Oil '1000mg'$  daily ?Interventions: ?Discussed LDL goal ?Discussed limiting fried foods and other foods that can increase triglycerides.  ?Patient self care activities - Over the next 180 days, patient will: ?Maintain cholesterol medication regimen.  ? ?Diabetes ?Lab Results  ?Component Value Date/Time  ? HGBA1C 6.7 (H) 12/14/2021 09:23 AM  ? HGBA1C 6.7 (H) 07/27/2021 07:51 AM  ? ?Pharmacist Clinical Goal(s): ?Over the next 180 days, patient will work with PharmD and providers to maintain A1c goal <7% ?Current regimen:  ?Metformin '500mg'$  twice a day ?Interventions: ?Discussed A1c goal ?Patient self care activities - Over the next 180 days, patient will: ?Maintain a1c less than 7% ? ?Medication management ?Pharmacist Clinical Goal(s): ?Over the next 180 days, patient will work with PharmD and providers to achieve optimal medication adherence ?[facility] ?Interventions ?Comprehensive medication review performed. ?Continue current medication management strategy ?Created list of medications and recommended time of day to take them.  ?Provided patient with day and night pill container ?Patient self care activities - Over the next 180 days, patient will: ?Focus on medication adherence by filling and taking medications appropriately  ?Take medications as prescribed ?Report any questions or concerns to PharmD and/or provider(s) ? ? ?Patient Goals/Self-Care Activities ?Over the next 90 days, patient will:  ?take medications as prescribed. Consider using weekly pill container as a reminder to take medications.  ?Get pill cutter to help with splitting spironolactone tablets.  ?collaborate with provider on medication access solutions ?engage in dietary modifications by limiting intake of sodium to '2300mg'$  per day ?Try to  be active every day as able - walking or physical therapy.  ? ?The patient verbalized understanding of  instructions, educational materials, and care plan provided today and declined offer to receive copy of patient instructions, educational materials, and care plan.   ?

## 2022-02-19 DIAGNOSIS — E1159 Type 2 diabetes mellitus with other circulatory complications: Secondary | ICD-10-CM

## 2022-02-19 DIAGNOSIS — E785 Hyperlipidemia, unspecified: Secondary | ICD-10-CM | POA: Diagnosis not present

## 2022-02-19 DIAGNOSIS — E1169 Type 2 diabetes mellitus with other specified complication: Secondary | ICD-10-CM | POA: Diagnosis not present

## 2022-02-19 DIAGNOSIS — I110 Hypertensive heart disease with heart failure: Secondary | ICD-10-CM

## 2022-02-19 DIAGNOSIS — I152 Hypertension secondary to endocrine disorders: Secondary | ICD-10-CM | POA: Diagnosis not present

## 2022-02-24 DIAGNOSIS — Z6839 Body mass index (BMI) 39.0-39.9, adult: Secondary | ICD-10-CM | POA: Diagnosis not present

## 2022-02-24 DIAGNOSIS — E119 Type 2 diabetes mellitus without complications: Secondary | ICD-10-CM | POA: Diagnosis not present

## 2022-02-24 DIAGNOSIS — I10 Essential (primary) hypertension: Secondary | ICD-10-CM | POA: Diagnosis not present

## 2022-02-24 DIAGNOSIS — Z7984 Long term (current) use of oral hypoglycemic drugs: Secondary | ICD-10-CM | POA: Diagnosis not present

## 2022-02-24 DIAGNOSIS — Z515 Encounter for palliative care: Secondary | ICD-10-CM | POA: Diagnosis not present

## 2022-03-10 ENCOUNTER — Ambulatory Visit (INDEPENDENT_AMBULATORY_CARE_PROVIDER_SITE_OTHER): Payer: HMO

## 2022-03-10 VITALS — Wt 304.0 lb

## 2022-03-10 DIAGNOSIS — Z0000 Encounter for general adult medical examination without abnormal findings: Secondary | ICD-10-CM

## 2022-03-10 NOTE — Progress Notes (Signed)
Subjective:   [name] [name] is a 81 y.o. male who presents for Medicare Annual/Subsequent preventive examination.  Virtual Visit via Telephone Note  I connected with  [name] [name] on 03/10/22 at  9:30 AM EDT by telephone and verified that I am speaking with the correct person using two identifiers.  Location: Patient: Home Provider: [place] Persons participating in the virtual visit: patient/Nurse Health Advisor   I discussed the limitations, risks, security and privacy concerns of performing an evaluation and management service by telephone and the availability of in person appointments. The patient expressed understanding and agreed to proceed.  Interactive audio and video telecommunications were attempted between this nurse and patient, however failed, due to patient having technical difficulties OR patient did not have access to video capability.  We continued and completed visit with audio only.  Some vital signs may be absent or patient reported.   Concha Sudol E Madoc Holquin, LPN   Review of Systems     Cardiac Risk Factors include: advanced age (>60mn, >>20women);diabetes mellitus;dyslipidemia;hypertension;male gender;sedentary lifestyle;obesity (BMI >30kg/m2);Other (see comment), Risk factor comments: CHF     Objective:    Today's Vitals   03/10/22 1126  Weight: (!) 304 lb (137.9 kg)   Body mass index is 41.23 kg/m.     03/10/2022   11:37 AM 10/04/2021    9:45 AM 12/13/2020   10:39 AM 09/09/2020    2:20 PM 05/28/2020   10:17 AM 02/26/2020   11:13 AM 01/19/2020   10:49 AM  Advanced Directives  Does Patient Have a Medical Advance Directive? Yes Yes Yes Yes No Yes No  Type of AParamedicof [place]opher Fl[place] will [place] will [place] will Living will;Healthcare Power of Attorney  Living will Living will  Does patient want to make changes to medical advance directive?  No - Patient declined No -  Patient declined No - Patient declined No - Patient declined No - Patient declined   Copy of [place] Chart? No - copy requested No - copy requested   No - copy requested  No - copy requested  Would patient like information on creating a medical advance directive?  No - Patient declined   No - Patient declined No - Patient declined No - Patient declined    Current Medications (verified) Outpatient Encounter Medications as of 03/10/2022  Medication Sig   allopurinol (ZYLOPRIM) 300 MG tablet TAKE 1 TABLET BY MOUTH EVERY DAY   aspirin 81 MG chewable tablet Chew 81 mg by mouth daily.   carvedilol (COREG) 12.5 MG tablet TAKE 1 TABLET BY MOUTH 2 TIMES DAILY.   diphenhydramine-acetaminophen (TYLENOL PM) 25-500 MG TABS tablet Take 1 tablet by mouth at bedtime as needed.   furosemide (LASIX) 40 MG tablet TAKE 1 TABLET (40 MG TOTAL) BY MOUTH DAILY AS NEEDED FOR FLUID OR EDEMA.   losartan (COZAAR) 100 MG tablet Take 1 tablet (100 mg total) by mouth daily.   meloxicam (MOBIC) 7.5 MG tablet Take 1 tablet by mouth daily.   metFORMIN (GLUCOPHAGE) 500 MG tablet TAKE 1 TABLET BY MOUTH EVERY DAY WITH BREAKFAST   Omega-3 Fatty Acids (FISH OIL) 1000 MG CAPS Take 1,000 mg by mouth daily.   potassium chloride (KLOR-CON M10) 10 MEQ tablet Take 1 tablet (10 mEq total) by mouth daily.   rosuvastatin (CRESTOR) 10 MG tablet TAKE 1 TABLET BY MOUTH EVERY DAY   spironolactone (ALDACTONE) 25 MG tablet Take 0.5  tablets (12.5 mg total) by mouth daily.   hydrochlorothiazide (HYDRODIURIL) 25 MG tablet TAKE 1 TABLET BY MOUTH EVERY DAY (Patient not taking: Reported on 03/10/2022)   No facility-administered encounter medications on file as of 03/10/2022.    Allergies (verified) Doxycycline   History: Past Medical History:  Diagnosis Date   Arthritis    Complication of anesthesia     had spinal with knee surgeries  -"heart rate too low" for general   Detached retina, left    L eye normal vision,  reports that he has had a retina procedure in a doctor's office at [facility]    Diabetes mellitus without complication ([place])    Dysrhythmia    slight irregular rate   GERD (gastroesophageal reflux disease)    hx no problems now   H/O echocardiogram 2014   History of hiatal hernia    ?20 yrs ago   Hyperopia 2016   Hypertension    Lumbar stenosis with neurogenic claudication    MG, ocular (myasthenia gravis) ([employer])    ? of L eye,    Prediabetes    Presbyopia OU 2016   Past Surgical History:  Procedure Laterality Date   COLONOSCOPY     HERNIA REPAIR     bilateral inguinal hernia   HERNIA REPAIR     umbilical   IR IMAGING GUIDED PORT INSERTION  09/16/2019   IR REMOVAL TUN ACCESS W/ PORT W/O FL MOD SED  03/04/2020   JOINT REPLACEMENT  2009   right knee   LUMBAR LAMINECTOMY/DECOMPRESSION MICRODISCECTOMY N/A 06/06/2017   Procedure: Decompression L3-5, insitu fusion L3-5 ;  Surgeon: Melina Schools, MD;  Location: [facility];  Service: Orthopedics;  Laterality: N/A;   LYMPH NODE BIOPSY Right 09/01/2019   Procedure: CERVICAL LYMPH NODE BIOPSY;  Surgeon: [name], MD;  Location: [facility];  Service: ENT;  Laterality: Right;   MIDDLE EAR SURGERY Right    ear -patched hole in ear drum   RIGHT/LEFT HEART CATH AND CORONARY ANGIOGRAPHY N/A 11/11/2019   Procedure: RIGHT/LEFT HEART CATH AND CORONARY ANGIOGRAPHY;  Surgeon: Jolaine Artist, MD;  Location: [facility];  Service: Cardiovascular;  Laterality: N/A;   TOTAL KNEE ARTHROPLASTY  09/10/2012   Procedure: TOTAL KNEE ARTHROPLASTY;  Surgeon: Tobi Bastos, MD;  Location: [facility];  Service: Orthopedics;  Laterality: Left;   TOTAL SHOULDER ARTHROPLASTY Right 06/22/2016   Procedure: RIGHT TOTAL SHOULDER ARTHROPLASTY;  Surgeon: [name], MD;  Location: [facility];  Service: Orthopedics;  Laterality: Right;   Family History  Problem Relation Age of Onset   Diabetes Brother    Colon cancer Brother 70       dx in 2018   Hypertension Sister     Prostate cancer Neg Hx    Stroke Neg Hx    CAD Neg Hx    Social History   Socioeconomic History   Marital status: Married    Spouse name: Not on file   Number of children: 4   Years of education: Not on file   Highest education level: Not on file  Occupational History   Occupation: [employer]-- retired 2010  Tobacco Use   Smoking status: Former    Packs/day: 0.50    Years: 2.00    Pack years: 1.00    Types: Cigarettes    Quit date: 06/09/1966    Years since quitting: 55.7   Smokeless tobacco: Never  Vaping Use   Vaping Use: Never used  Substance and Sexual Activity  Alcohol use: No    Alcohol/week: 0.0 standard drinks    Comment: none   Drug use: No   Sexual activity: Yes    Partners: Female  Other Topics Concern   Not on file  Social History Narrative   married, 2nd wife at a nursing home, memory unit   Lives alone , lives independently     2 living children, lost 2 kids     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   Social Determinants of Health   Financial Resource Strain: Low Risk    Difficulty of Paying Living Expenses: Not hard at all  Food Insecurity: No Food Insecurity   Worried About Charity fundraiser in the Last Year: Never true   Arboriculturist in the Last Year: Never true  Transportation Needs: No Transportation Needs   Lack of Transportation (Medical): No   Lack of Transportation (Non-Medical): No  Physical Activity: Insufficiently Active   Days of Exercise per Week: 7 days   Minutes of Exercise per Session: 20 min  Stress: No Stress Concern Present   Feeling of Stress : Only a little  Social Connections: Engineer, building services of Communication with Friends and Family: More than three times a week   Frequency of Social Gatherings with Friends and Family: More than three times a week   Attends Religious Services: More than 4 times per year   Active Member of [employer] or Organizations: Yes   Attends Arts administrator: More than 4 times per year   Marital Status: Married    Tobacco Counseling Counseling given: Not Answered   Clinical Intake:  Pre-visit preparation completed: Yes  Pain : No/denies pain     BMI - recorded: 41.23 Nutritional Status: BMI > 30  Obese Nutritional Risks: None Diabetes: Yes CBG done?: No Did pt. bring in CBG monitor from home?: No  How often do you need to have someone help you when you read instructions, pamphlets, or other written materials from your doctor or pharmacy?: 1 - Never  Diabetic? Nutrition Risk Assessment:  Has the patient had any N/V/D within the last 2 months?  No  Does the patient have any non-healing wounds?  No  Has the patient had any unintentional weight loss or weight gain?  No   Diabetes:  Is the patient diabetic?  Yes  If diabetic, was a CBG obtained today?  No  Did the patient bring in their glucometer from home?  No  How often do you monitor your CBG's? never.   Financial Strains and Diabetes Management:  Are you having any financial strains with the device, your supplies or your medication? No .  Does the patient want to be seen by Chronic Care Management for management of their diabetes?  No  Would the patient like to be referred to a Nutritionist or for Diabetic Management?  No   Diabetic Exams:  Diabetic Eye Exam: Completed 12/01/2021.   Diabetic Foot Exam: Completed 12/28/2021. Pt has been advised about the importance in completing this exam.  Interpreter Needed?: No  Information entered by :: Wandalene Abrams, LPN   Activities of Daily Living    03/10/2022   11:38 AM  In your present state of health, do you have any difficulty performing the following activities:  Hearing? 0  Vision? 0  Difficulty concentrating or making decisions? 0  Walking or climbing stairs? 1  Dressing or bathing? 0  Doing errands, shopping? 0  Preparing Food and eating ? N  Using the Toilet? N  In the past six months, have you  accidently leaked urine? N  Do you have problems with loss of bowel control? N  Managing your Medications? N  Managing your Finances? N  Housekeeping or managing your Housekeeping? N    Patient Care Team: Colon Branch, MD as PCP - General [name], [name], MD as PCP - Cardiology (Cardiology) [name], MD as Consulting Physician (Gastroenterology) Marin Olp Rudell Cobb, MD as Medical Oncologist (Oncology) [name], [place] as Referring Physician (Optometry) [name], RPH-CPP (Pharmacist) Bensimhon, [name], MD as Consulting Physician (Cardiology) Gardiner Barefoot, DPM as Consulting Physician (Podiatry)  Indicate any recent Medical Services you may have received from other than [facility] providers in the past year (date may be approximate).     Assessment:   This is a routine wellness examination for [place].  Hearing/Vision screen Hearing Screening - Comments:: Denies hearing difficulties   Vision Screening - Comments:: Wears otc reading glasses prn only - up to date with routine eye exams with [facility]  Dietary issues and exercise activities discussed: Current Exercise Habits: Home exercise routine, Type of exercise: walking, Time (Minutes): 20, Frequency (Times/Week): 7, Weekly Exercise (Minutes/Week): 140, Intensity: Mild, Exercise limited by: cardiac condition(s)   Goals Addressed             This Visit's Progress    Reduce portion size   On track    Please review education provided.     Walk more.   On track      Depression Screen    03/10/2022   11:35 AM 12/14/2021    9:15 AM 07/08/2021   10:54 AM 02/07/2021   10:52 AM 12/08/2020   10:04 AM 06/03/2020    9:55 AM 04/10/2019    2:06 PM  PHQ 2/9 Scores  PHQ - 2 Score 0 0 '2 1 2 3 '$ 0  PHQ- 9 Score   '7 3 6 5     '$ Fall Risk    03/10/2022   11:28 AM 12/14/2021    9:15 AM 07/08/2021   10:33 AM 04/13/2021    8:54 AM 12/08/2020    9:12 AM  Fall Risk   Falls in the past year? 1 0 0 0 0  Number falls in past  yr: 0 0 0 0 0  Injury with Fall? 0 0 0 0 0  Risk for fall due to : History of fall(s);Orthopedic patient    Impaired balance/gait  Follow up Falls prevention discussed;Education provided Falls evaluation completed Falls evaluation completed Falls evaluation completed     FALL RISK PREVENTION PERTAINING TO THE HOME:  Any stairs in or around the home? Yes  If so, are there any without handrails? No  Home free of loose throw rugs in walkways, pet beds, electrical cords, etc? Yes  Adequate lighting in your home to reduce risk of falls? Yes   ASSISTIVE DEVICES UTILIZED TO PREVENT FALLS:  Life alert? No  Use of a cane, walker or w/c?  Cane prn only if he has to stand or walk long distances Grab bars in the bathroom? Yes  Shower chair or bench in shower? No  Elevated toilet seat or a handicapped toilet? Yes   TIMED UP AND GO:  Was the test performed? No . Telephonic visit  Cognitive Function:    12/07/2017   10:20 AM 12/06/2016    8:52 AM  MMSE - Mini Mental State Exam  Orientation to  time 5 5  Orientation to Place 5 5  Registration 3 3  Attention/ Calculation 3 2  Recall 3 3  Language- name 2 objects 2 2  Language- repeat 1 1  Language- follow 3 step command 3 3  Language- read & follow direction 1 1  Write a sentence 1 1  Copy design 1 1  Total score 28 27        03/10/2022   11:38 AM  6CIT Screen  What Year? 0 points  What month? 0 points  What time? 0 points  Count back from 20 0 points  Months in reverse 0 points  Repeat phrase 6 points  Total Score 6 points    Immunizations Immunization History  Administered Date(s) Administered   Fluad Quad(high Dose 65+) 06/20/2019, 10/04/2020   H1N1 11/12/2008   Influenza Split 07/17/2014, 07/31/2018, 10/18/2021   Influenza Whole 08/04/2010   Influenza, High Dose Seasonal PF 10/18/2021   Influenza,inj,Quad PF,6+ Mos 06/23/2015   Influenza-Unspecified 07/23/2013, 07/07/2016, 07/20/2017   Moderna Sars-Covid-2  Vaccination 03/16/2020, 04/14/2020   PNEUMOCOCCAL CONJUGATE-20 12/14/2021   Pneumococcal Conjugate-13 08/12/2014   Pneumococcal Polysaccharide-23 11/12/2008, 05/28/2019   Td 12/19/2007   Tdap 04/04/2018   Zoster Recombinat (Shingrix) 12/20/2018, 05/12/2019   Zoster, Live 10/24/2011    TDAP status: Up to date  Flu Vaccine status: Up to date  Pneumococcal vaccine status: Up to date  Covid-19 vaccine status: Completed vaccines  Qualifies for Shingles Vaccine? Yes   Zostavax completed Yes   Shingrix Completed?: Yes  Screening Tests Health Maintenance  Topic Date Due   COVID-19 Vaccine (3 - Moderna risk series) 05/12/2020   COLONOSCOPY (Pts 45-43yr Insurance coverage will need to be confirmed)  09/11/2021   INFLUENZA VACCINE  05/23/2022   HEMOGLOBIN A1C  06/13/2022   OPHTHALMOLOGY EXAM  12/01/2022   FOOT EXAM  12/29/2022   TETANUS/TDAP  04/04/2028   Pneumonia Vaccine 80 Years old  Completed   Hepatitis C Screening  Completed   Zoster Vaccines- Shingrix  Completed   HPV VACCINES  Aged Out    Health Maintenance  Health Maintenance Due  Topic Date Due   COVID-19 Vaccine (3 - Moderna risk series) 05/12/2020   COLONOSCOPY (Pts 45-437yrInsurance coverage will need to be confirmed)  09/11/2021    Colorectal cancer screening: Type of screening: Colonoscopy. Completed 09/11/2018. Repeat every 3 years Says he has appt with [facility]  Lung Cancer Screening: (Low Dose CT Chest recommended if Age 80-80ears, 30 pack-year currently smoking OR have quit w/in 15years.) does not qualify.  Additional Screening:  Hepatitis C Screening: does qualify; Completed 09/09/2019  Vision Screening: Recommended annual ophthalmology exams for early detection of glaucoma and other disorders of the eye. Is the patient up to date with their annual eye exam?  Yes  Who is the provider or what is the name of the office in which the patient attends annual eye exams? [facility] If pt is not  established with a provider, would they like to be referred to a provider to establish care? No .   Dental Screening: Recommended annual dental exams for proper oral hygiene  Community Resource Referral / Chronic Care Management: CRR required this visit?  No   CCM required this visit?  No      Plan:     I have personally reviewed and noted the following in the patient's chart:   Medical and social history Use of alcohol, tobacco or illicit drugs  Current medications and supplements including  opioid prescriptions. Patient is not currently taking opioid prescriptions. Functional ability and status Nutritional status Physical activity Advanced directives List of other physicians Hospitalizations, surgeries, and ER visits in previous 12 months Vitals Screenings to include cognitive, depression, and falls Referrals and appointments  In addition, I have reviewed and discussed with patient certain preventive protocols, quality metrics, and best practice recommendations. A written personalized care plan for preventive services as well as general preventive health recommendations were provided to patient.     Sandrea Hammond, LPN   [id number]   Nurse Notes: None

## 2022-03-10 NOTE — Patient Instructions (Signed)
[name] [name] , Thank you for taking time to come for your Medicare Wellness Visit. I appreciate your ongoing commitment to your health goals. Please review the following plan we discussed and let me know if I can assist you in the future.   Screening recommendations/referrals: Colonoscopy: done 09/11/2018 - repeat in 3 years *this is due - keep appointment with GI doccor Recommended yearly ophthalmology/optometry visit for glaucoma screening and checkup Recommended yearly dental visit for hygiene and checkup  Vaccinations: Influenza vaccine: Done 10/18/2021 - Repeat annually Pneumococcal vaccine: Done 08/12/2014, 05/28/2019, & 12/14/2021 Tdap vaccine: Done 04/04/2018 - Repeat in 10 years Shingles vaccine: Done   12/20/2018 & 05/20/2019 Covid-19: Done 03/16/2020 & 04/14/2020  Advanced directives: Please bring a copy of your health care power of attorney and living will to the office to be added to your chart at your convenience.   Conditions/risks identified: Aim for 30 minutes of exercise or brisk walking, 6-8 glasses of water, and 5 servings of fruits and vegetables each day.   Next appointment: Follow up in one year for your annual wellness visit.   Preventive Care 80 Years and Older, Male  Preventive care refers to lifestyle choices and visits with your health care provider that can promote health and wellness. What does preventive care include? A yearly physical exam. This is also called an annual well check. Dental exams once or twice a year. Routine eye exams. Ask your health care provider how often you should have your eyes checked. Personal lifestyle choices, including: Daily care of your teeth and gums. Regular physical activity. Eating a healthy diet. Avoiding tobacco and drug use. Limiting alcohol use. Practicing safe sex. Taking low doses of aspirin every day. Taking vitamin and mineral supplements as recommended by your health care provider. What happens during an annual well  check? The services and screenings done by your health care provider during your annual well check will depend on your age, overall health, lifestyle risk factors, and family history of disease. Counseling  Your health care provider may ask you questions about your: Alcohol use. Tobacco use. Drug use. Emotional well-being. Home and relationship well-being. Sexual activity. Eating habits. History of falls. Memory and ability to understand (cognition). Work and work Statistician. Screening  You may have the following tests or measurements: Height, weight, and BMI. Blood pressure. Lipid and cholesterol levels. These may be checked every 5 years, or more frequently if you are over 80 years old. Skin check. Lung cancer screening. You may have this screening every year starting at age 80 if you have a 30-pack-year history of smoking and currently smoke or have quit within the past 15 years. Fecal occult blood test (FOBT) of the stool. You may have this test every year starting at age 80. Flexible sigmoidoscopy or colonoscopy. You may have a sigmoidoscopy every 5 years or a colonoscopy every 10 years starting at age 50. Prostate cancer screening. Recommendations will vary depending on your family history and other risks. Hepatitis C blood test. Hepatitis B blood test. Sexually transmitted disease (STD) testing. Diabetes screening. This is done by checking your blood sugar (glucose) after you have not eaten for a while (fasting). You may have this done every 1-3 years. Abdominal aortic aneurysm (AAA) screening. You may need this if you are a current or former smoker. Osteoporosis. You may be screened starting at age 80 if you are at high risk. Talk with your health care provider about your test results, treatment options, and if necessary, the  need for more tests. Vaccines  Your health care provider may recommend certain vaccines, such as: Influenza vaccine. This is recommended every  year. Tetanus, diphtheria, and acellular pertussis (Tdap, Td) vaccine. You may need a Td booster every 10 years. Zoster vaccine. You may need this after age 52. Pneumococcal 13-valent conjugate (PCV13) vaccine. One dose is recommended after age 80. Pneumococcal polysaccharide (PPSV23) vaccine. One dose is recommended after age 26. Talk to your health care provider about which screenings and vaccines you need and how often you need them. This information is not intended to replace advice given to you by your health care provider. Make sure you discuss any questions you have with your health care provider. Document Released: 11/05/2015 Document Revised: 06/28/2016 Document Reviewed: 08/10/2015 Elsevier Interactive Patient Education  2017 [place] Prevention in the Home Falls can cause injuries. They can happen to people of all ages. There are many things you can do to make your home safe and to help prevent falls. What can I do on the outside of my home? Regularly fix the edges of walkways and driveways and fix any cracks. Remove anything that might make you trip as you walk through a door, such as a raised step or threshold. Trim any bushes or trees on the path to your home. Use bright outdoor lighting. Clear any walking paths of anything that might make someone trip, such as rocks or tools. Regularly check to see if handrails are loose or broken. Make sure that both sides of any steps have handrails. Any raised decks and porches should have guardrails on the edges. Have any leaves, snow, or ice cleared regularly. Use sand or salt on walking paths during winter. Clean up any spills in your garage right away. This includes oil or grease spills. What can I do in the bathroom? Use night lights. Install grab bars by the toilet and in the tub and shower. Do not use towel bars as grab bars. Use non-skid mats or decals in the tub or shower. If you need to sit down in the shower, use a  plastic, non-slip stool. Keep the floor dry. Clean up any water that spills on the floor as soon as it happens. Remove soap buildup in the tub or shower regularly. Attach bath mats securely with double-sided non-slip rug tape. Do not have throw rugs and other things on the floor that can make you trip. What can I do in the bedroom? Use night lights. Make sure that you have a light by your bed that is easy to reach. Do not use any sheets or blankets that are too big for your bed. They should not hang down onto the floor. Have a firm chair that has side arms. You can use this for support while you get dressed. Do not have throw rugs and other things on the floor that can make you trip. What can I do in the kitchen? Clean up any spills right away. Avoid walking on wet floors. Keep items that you use a lot in easy-to-reach places. If you need to reach something above you, use a strong step stool that has a grab bar. Keep electrical cords out of the way. Do not use floor polish or wax that makes floors slippery. If you must use wax, use non-skid floor wax. Do not have throw rugs and other things on the floor that can make you trip. What can I do with my stairs? Do not leave any items on the  stairs. Make sure that there are handrails on both sides of the stairs and use them. Fix handrails that are broken or loose. Make sure that handrails are as long as the stairways. Check any carpeting to make sure that it is firmly attached to the stairs. Fix any carpet that is loose or worn. Avoid having throw rugs at the top or bottom of the stairs. If you do have throw rugs, attach them to the floor with carpet tape. Make sure that you have a light switch at the top of the stairs and the bottom of the stairs. If you do not have them, ask someone to add them for you. What else can I do to help prevent falls? Wear shoes that: Do not have high heels. Have rubber bottoms. Are comfortable and fit you  well. Are closed at the toe. Do not wear sandals. If you use a stepladder: Make sure that it is fully opened. Do not climb a closed stepladder. Make sure that both sides of the stepladder are locked into place. Ask someone to hold it for you, if possible. Clearly mark and make sure that you can see: Any grab bars or handrails. First and last steps. Where the edge of each step is. Use tools that help you move around (mobility aids) if they are needed. These include: Canes. Walkers. Scooters. Crutches. Turn on the lights when you go into a dark area. Replace any light bulbs as soon as they burn out. Set up your furniture so you have a clear path. Avoid moving your furniture around. If any of your floors are uneven, fix them. If there are any pets around you, be aware of where they are. Review your medicines with your doctor. Some medicines can make you feel dizzy. This can increase your chance of falling. Ask your doctor what other things that you can do to help prevent falls. This information is not intended to replace advice given to you by your health care provider. Make sure you discuss any questions you have with your health care provider. Document Released: 08/05/2009 Document Revised: 03/16/2016 Document Reviewed: 11/13/2014 Elsevier Interactive Patient Education  2017 [employer].

## 2022-03-27 ENCOUNTER — Other Ambulatory Visit: Payer: Self-pay | Admitting: Oncology

## 2022-03-31 ENCOUNTER — Ambulatory Visit: Payer: HMO | Admitting: Podiatry

## 2022-03-31 ENCOUNTER — Encounter: Payer: Self-pay | Admitting: Podiatry

## 2022-03-31 DIAGNOSIS — M79675 Pain in left toe(s): Secondary | ICD-10-CM

## 2022-03-31 DIAGNOSIS — B351 Tinea unguium: Secondary | ICD-10-CM

## 2022-03-31 DIAGNOSIS — E1169 Type 2 diabetes mellitus with other specified complication: Secondary | ICD-10-CM

## 2022-03-31 DIAGNOSIS — M79674 Pain in right toe(s): Secondary | ICD-10-CM | POA: Diagnosis not present

## 2022-03-31 DIAGNOSIS — L608 Other nail disorders: Secondary | ICD-10-CM

## 2022-03-31 NOTE — Progress Notes (Signed)
This patient returns to my office for at risk foot care.  This patient requires this care by a professional since this patient will be at risk due to having diabetes type 2. This patient is having problems with his left knee and hip.  This patient is unable to cut nails himself since the patient cannot reach his nails.These nails are painful walking and wearing shoes.  This patient presents for at risk foot care today.  General Appearance  Alert, conversant and in no acute stress.  Vascular  Dorsalis pedis  are palpable  bilaterally. Posterior tibial pulses are absent  B/L. Capillary return is within normal limits  bilaterally. Cold feet  Bilaterally. Absent hair  B/L.  Neurologic  Senn-Weinstein monofilament wire test within normal limits/diminished   bilaterally. Muscle power within normal limits bilaterally.  Nails Thick disfigured discolored nails with subungual debris  Hallux nails. No evidence of bacterial infection or drainage bilaterally.  Orthopedic  No limitations of motion  feet .  No crepitus or effusions noted.  No bony pathology or digital deformities noted.  HAV  B/L.  Skin  normotropic skin with no porokeratosis noted bilaterally.  No signs of infections or ulcers noted.     Onychomycosis  Pain in right toes  Pain in left toes  Consent was obtained for treatment procedures.   Mechanical debridement of nails 1-5  bilaterally performed with a nail nipper.  Filed with dremel without incident.    Return office visit     3 months.                Told patient to return for periodic foot care and evaluation due to potential at risk complications.   Larisha Vencill DPM  

## 2022-04-04 ENCOUNTER — Inpatient Hospital Stay: Payer: HMO | Attending: Hematology & Oncology

## 2022-04-04 ENCOUNTER — Inpatient Hospital Stay: Payer: HMO | Admitting: Hematology & Oncology

## 2022-04-04 ENCOUNTER — Telehealth: Payer: Self-pay | Admitting: *Deleted

## 2022-04-04 ENCOUNTER — Encounter: Payer: Self-pay | Admitting: Hematology & Oncology

## 2022-04-04 ENCOUNTER — Other Ambulatory Visit: Payer: Self-pay | Admitting: Lab

## 2022-04-04 VITALS — BP 150/69 | HR 63 | Temp 98.3°F | Resp 18 | Ht 72.0 in | Wt 308.8 lb

## 2022-04-04 DIAGNOSIS — C8440 Peripheral T-cell lymphoma, not classified, unspecified site: Secondary | ICD-10-CM | POA: Diagnosis not present

## 2022-04-04 DIAGNOSIS — Z9221 Personal history of antineoplastic chemotherapy: Secondary | ICD-10-CM | POA: Diagnosis not present

## 2022-04-04 DIAGNOSIS — Z79899 Other long term (current) drug therapy: Secondary | ICD-10-CM | POA: Diagnosis not present

## 2022-04-04 DIAGNOSIS — C865 Angioimmunoblastic T-cell lymphoma: Secondary | ICD-10-CM | POA: Diagnosis not present

## 2022-04-04 LAB — CBC WITH DIFFERENTIAL (CANCER CENTER ONLY)
Abs Immature Granulocytes: 0.05 10*3/uL (ref 0.00–0.07)
Basophils Absolute: 0.1 10*3/uL (ref 0.0–0.1)
Basophils Relative: 1 %
Eosinophils Absolute: 0.3 10*3/uL (ref 0.0–0.5)
Eosinophils Relative: 5 %
HCT: 39.2 % (ref 39.0–52.0)
Hemoglobin: 13.2 g/dL (ref 13.0–17.0)
Immature Granulocytes: 1 %
Lymphocytes Relative: 17 %
Lymphs Abs: 1.2 10*3/uL (ref 0.7–4.0)
MCH: 31.5 pg (ref 26.0–34.0)
MCHC: 33.7 g/dL (ref 30.0–36.0)
MCV: 93.6 fL (ref 80.0–100.0)
Monocytes Absolute: 0.6 10*3/uL (ref 0.1–1.0)
Monocytes Relative: 9 %
Neutro Abs: 4.6 10*3/uL (ref 1.7–7.7)
Neutrophils Relative %: 67 %
Platelet Count: 203 10*3/uL (ref 150–400)
RBC: 4.19 MIL/uL — ABNORMAL LOW (ref 4.22–5.81)
RDW: 13.5 % (ref 11.5–15.5)
WBC Count: 6.8 10*3/uL (ref 4.0–10.5)
nRBC: 0 % (ref 0.0–0.2)

## 2022-04-04 LAB — CMP (CANCER CENTER ONLY)
ALT: 12 U/L (ref 0–44)
AST: 12 U/L — ABNORMAL LOW (ref 15–41)
Albumin: 4.3 g/dL (ref 3.5–5.0)
Alkaline Phosphatase: 67 U/L (ref 38–126)
Anion gap: 11 (ref 5–15)
BUN: 16 mg/dL (ref 8–23)
CO2: 26 mmol/L (ref 22–32)
Calcium: 9.1 mg/dL (ref 8.9–10.3)
Chloride: 103 mmol/L (ref 98–111)
Creatinine: 1.23 mg/dL (ref 0.61–1.24)
GFR, Estimated: 60 mL/min — ABNORMAL LOW (ref >60)
Glucose, Bld: 202 mg/dL — ABNORMAL HIGH (ref 70–99)
Potassium: 4.3 mmol/L (ref 3.5–5.1)
Sodium: 140 mmol/L (ref 135–145)
Total Bilirubin: 0.6 mg/dL (ref 0.3–1.2)
Total Protein: 6.3 g/dL — ABNORMAL LOW (ref 6.5–8.1)

## 2022-04-04 LAB — LACTATE DEHYDROGENASE: LDH: 145 U/L (ref 98–192)

## 2022-04-04 NOTE — Telephone Encounter (Signed)
Per 04/04/22 los - gave upcoming appointments - confirmed

## 2022-04-04 NOTE — Progress Notes (Signed)
Hematology and Oncology Follow Up Visit  [name] [name] [id number] 1941/04/28 81 y.o. 04/04/2022   Principle Diagnosis:  Stage IV angioimmunoblastic T-cell NHL  Current Therapy:   S/p CHP + Adcetris x 6 cycles -- completed on 01/19/2020     Interim History:  Mr. Tokarz is back for follow-up.  The bad news is that his brother died yesterday.  He had colon cancer.  Sorry about this.  Mr. Szabo has gained quite a bit of weight.  He now is over 300 pounds.  A lot of this has to do with his wife not being able to be with him.  She is in a memory care center.  He cooks his own food.  He obviously is cooking a little bit too much.  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  He goes to numerous churches and plays.  He has had no problems with cough or shortness of breath.  He has had no issues with COVID.  There is no change in bowel or bladder habits.  He is supposed to have a colonoscopy tomorrow.  He has had no bleeding.  There has been no problems with headache.  He does have diabetic neuropathy.   Medications:  Current Outpatient Medications:    allopurinol (ZYLOPRIM) 300 MG tablet, TAKE 1 TABLET BY MOUTH EVERY DAY, Disp: 90 tablet, Rfl: 1   aspirin 81 MG chewable tablet, Chew 81 mg by mouth daily., Disp: , Rfl:    carvedilol (COREG) 12.5 MG tablet, TAKE 1 TABLET BY MOUTH 2 TIMES DAILY., Disp: 180 tablet, Rfl: 1   diphenhydramine-acetaminophen (TYLENOL PM) 25-500 MG TABS tablet, Take 1 tablet by mouth at bedtime as needed., Disp: , Rfl:    furosemide (LASIX) 40 MG tablet, TAKE 1 TABLET (40 MG TOTAL) BY MOUTH DAILY AS NEEDED FOR FLUID OR EDEMA., Disp: 90 tablet, Rfl: 1   losartan (COZAAR) 100 MG tablet, Take 1 tablet (100 mg total) by mouth daily., Disp: 90 tablet, Rfl: 1   meloxicam (MOBIC) 7.5 MG tablet, Take 1 tablet by mouth daily., Disp: , Rfl:    metFORMIN (GLUCOPHAGE) 500 MG tablet, TAKE 1 TABLET BY MOUTH EVERY DAY WITH BREAKFAST, Disp: 90 tablet, Rfl: 1   Omega-3  Fatty Acids (FISH OIL) 1000 MG CAPS, Take 1,000 mg by mouth daily., Disp: , Rfl:    potassium chloride (KLOR-CON M10) 10 MEQ tablet, Take 1 tablet (10 mEq total) by mouth daily., Disp: 90 tablet, Rfl: 1   rosuvastatin (CRESTOR) 10 MG tablet, TAKE 1 TABLET BY MOUTH EVERY DAY, Disp: 90 tablet, Rfl: 3   spironolactone (ALDACTONE) 25 MG tablet, Take 0.5 tablets (12.5 mg total) by mouth daily., Disp: 90 tablet, Rfl: 3   hydrochlorothiazide (HYDRODIURIL) 25 MG tablet, TAKE 1 TABLET BY MOUTH EVERY DAY (Patient not taking: Reported on 03/10/2022), Disp: 90 tablet, Rfl: 1  Allergies:  Allergies  Allergen Reactions   Doxycycline Rash    Past Medical History, Surgical history, Social history, and Family History were reviewed and updated.  Review of Systems: Review of Systems  Constitutional: Negative.   HENT:  Negative.    Eyes: Negative.   Respiratory:  Positive for shortness of breath.   Cardiovascular:  Positive for leg swelling.  Gastrointestinal:  Positive for nausea.  Endocrine: Negative.   Genitourinary: Negative.    Musculoskeletal:  Positive for arthralgias.  Skin: Negative.   Neurological: Negative.   Hematological: Negative.   Psychiatric/Behavioral: Negative.      Physical Exam:  height is 6' (1.829 m) and weight is 308 lb 12 oz (140 kg) (abnormal). His oral temperature is 98.3 F (36.8 C). His blood pressure is 150/69 (abnormal) and his pulse is 63. His respiration is 18 and oxygen saturation is 97%.   Wt Readings from Last 3 Encounters:  04/04/22 (!) 308 lb 12 oz (140 kg)  03/10/22 (!) 304 lb (137.9 kg)  12/14/21 (!) 304 lb (137.9 kg)    Physical Exam Vitals reviewed.  HENT:     Head: Normocephalic and atraumatic.  Eyes:     Pupils: Pupils are equal, round, and reactive to light.  Cardiovascular:     Rate and Rhythm: Normal rate and regular rhythm.     Heart sounds: Normal heart sounds.  Pulmonary:     Effort: Pulmonary effort is normal.     Breath sounds: Normal  breath sounds.  Abdominal:     General: Bowel sounds are normal.     Palpations: Abdomen is soft.  Musculoskeletal:        General: No tenderness or deformity. Normal range of motion.     Cervical back: Normal range of motion.  Lymphadenopathy:     Cervical: No cervical adenopathy.  Skin:    General: Skin is warm and dry.     Findings: No erythema or rash.  Neurological:     Mental Status: He is alert and oriented to person, place, and time.  Psychiatric:        Behavior: Behavior normal.        Thought Content: Thought content normal.        Judgment: Judgment normal.      Lab Results  Component Value Date   WBC 6.8 04/04/2022   HGB 13.2 04/04/2022   HCT 39.2 04/04/2022   MCV 93.6 04/04/2022   PLT 203 04/04/2022     Chemistry      Component Value Date/Time   NA 140 04/04/2022 0856   K 4.3 04/04/2022 0856   CL 103 04/04/2022 0856   CO2 26 04/04/2022 0856   BUN 16 04/04/2022 0856   CREATININE 1.23 04/04/2022 0856   CREATININE 1.41 (H) 06/06/2019 1528      Component Value Date/Time   CALCIUM 9.1 04/04/2022 0856   ALKPHOS 67 04/04/2022 0856   AST 12 (L) 04/04/2022 0856   ALT 12 04/04/2022 0856   BILITOT 0.6 04/04/2022 0856      Impression and Plan: Mr. Bantz is a very nice 81 year old white male.  He had stage IV T-cell lymphoma.  This was an angioimmunoblastic lymphoma.  He was treated with chemotherapy and targeted therapy.  He completed 6 cycles of treatment back on 01/19/2020.  He went into remission.  He is still in remission.  So far, everything looks fantastic.  I just wish that the weight would not be a problem for him.  We will still plan for another follow-up in 6 months.  I will certainly pray for him as he deals with his brother's passing.     [name], MD 6/13/202311:06 AM

## 2022-04-05 ENCOUNTER — Other Ambulatory Visit: Payer: Self-pay | Admitting: Internal Medicine

## 2022-04-05 DIAGNOSIS — D122 Benign neoplasm of ascending colon: Secondary | ICD-10-CM | POA: Diagnosis not present

## 2022-04-05 DIAGNOSIS — D125 Benign neoplasm of sigmoid colon: Secondary | ICD-10-CM | POA: Diagnosis not present

## 2022-04-05 DIAGNOSIS — Z09 Encounter for follow-up examination after completed treatment for conditions other than malignant neoplasm: Secondary | ICD-10-CM | POA: Diagnosis not present

## 2022-04-05 DIAGNOSIS — Z800 Family history of malignant neoplasm of digestive organs: Secondary | ICD-10-CM | POA: Diagnosis not present

## 2022-04-05 DIAGNOSIS — Z86010 Personal history of colonic polyps: Secondary | ICD-10-CM | POA: Diagnosis not present

## 2022-04-05 LAB — HM COLONOSCOPY

## 2022-04-07 DIAGNOSIS — D122 Benign neoplasm of ascending colon: Secondary | ICD-10-CM | POA: Diagnosis not present

## 2022-04-07 DIAGNOSIS — D125 Benign neoplasm of sigmoid colon: Secondary | ICD-10-CM | POA: Diagnosis not present

## 2022-04-12 ENCOUNTER — Other Ambulatory Visit: Payer: Self-pay

## 2022-04-12 ENCOUNTER — Emergency Department (HOSPITAL_COMMUNITY)
Admission: EM | Admit: 2022-04-12 | Discharge: 2022-04-13 | Disposition: A | Discharge status: Home / Self Care | Payer: HMO | Attending: Emergency Medicine | Admitting: Emergency Medicine

## 2022-04-12 ENCOUNTER — Encounter (HOSPITAL_COMMUNITY): Payer: Self-pay | Admitting: Emergency Medicine

## 2022-04-12 DIAGNOSIS — W19XXXA Unspecified fall, initial encounter: Secondary | ICD-10-CM

## 2022-04-12 DIAGNOSIS — Y92019 Unspecified place in single-family (private) house as the place of occurrence of the external cause: Secondary | ICD-10-CM | POA: Diagnosis not present

## 2022-04-12 DIAGNOSIS — S21212A Laceration without foreign body of left back wall of thorax without penetration into thoracic cavity, initial encounter: Secondary | ICD-10-CM

## 2022-04-12 DIAGNOSIS — S3992XA Unspecified injury of lower back, initial encounter: Secondary | ICD-10-CM | POA: Diagnosis present

## 2022-04-12 DIAGNOSIS — W01190A Fall on same level from slipping, tripping and stumbling with subsequent striking against furniture, initial encounter: Secondary | ICD-10-CM | POA: Insufficient documentation

## 2022-04-12 DIAGNOSIS — S31109A Unspecified open wound of abdominal wall, unspecified quadrant without penetration into peritoneal cavity, initial encounter: Secondary | ICD-10-CM | POA: Diagnosis not present

## 2022-04-12 DIAGNOSIS — R58 Hemorrhage, not elsewhere classified: Secondary | ICD-10-CM | POA: Diagnosis not present

## 2022-04-12 DIAGNOSIS — S31010A Laceration without foreign body of lower back and pelvis without penetration into retroperitoneum, initial encounter: Secondary | ICD-10-CM | POA: Diagnosis not present

## 2022-04-12 DIAGNOSIS — Z7984 Long term (current) use of oral hypoglycemic drugs: Secondary | ICD-10-CM | POA: Insufficient documentation

## 2022-04-12 DIAGNOSIS — Z7982 Long term (current) use of aspirin: Secondary | ICD-10-CM | POA: Diagnosis not present

## 2022-04-12 LAB — CBC WITH DIFFERENTIAL/PLATELET
Abs Immature Granulocytes: 0.07 10*3/uL (ref 0.00–0.07)
Basophils Absolute: 0.1 10*3/uL (ref 0.0–0.1)
Basophils Relative: 1 %
Eosinophils Absolute: 0.3 10*3/uL (ref 0.0–0.5)
Eosinophils Relative: 3 %
HCT: 40.1 % (ref 39.0–52.0)
Hemoglobin: 13.2 g/dL (ref 13.0–17.0)
Immature Granulocytes: 1 %
Lymphocytes Relative: 14 %
Lymphs Abs: 1.4 10*3/uL (ref 0.7–4.0)
MCH: 31.1 pg (ref 26.0–34.0)
MCHC: 32.9 g/dL (ref 30.0–36.0)
MCV: 94.6 fL (ref 80.0–100.0)
Monocytes Absolute: 1 10*3/uL (ref 0.1–1.0)
Monocytes Relative: 10 %
Neutro Abs: 7.3 10*3/uL (ref 1.7–7.7)
Neutrophils Relative %: 71 %
Platelets: 239 10*3/uL (ref 150–400)
RBC: 4.24 MIL/uL (ref 4.22–5.81)
RDW: 13.3 % (ref 11.5–15.5)
WBC: 10 10*3/uL (ref 4.0–10.5)
nRBC: 0 % (ref 0.0–0.2)

## 2022-04-12 LAB — I-STAT CHEM 8, ED
BUN: 18 mg/dL (ref 8–23)
Calcium, Ion: 1.14 mmol/L — ABNORMAL LOW (ref 1.15–1.40)
Chloride: 103 mmol/L (ref 98–111)
Creatinine, Ser: 1.2 mg/dL (ref 0.61–1.24)
Glucose, Bld: 117 mg/dL — ABNORMAL HIGH (ref 70–99)
HCT: 38 % — ABNORMAL LOW (ref 39.0–52.0)
Hemoglobin: 12.9 g/dL — ABNORMAL LOW (ref 13.0–17.0)
Potassium: 3.8 mmol/L (ref 3.5–5.1)
Sodium: 137 mmol/L (ref 135–145)
TCO2: 24 mmol/L (ref 22–32)

## 2022-04-12 MED ORDER — FENTANYL CITRATE PF 50 MCG/ML IJ SOSY
50.0000 ug | PREFILLED_SYRINGE | Freq: Once | INTRAMUSCULAR | Status: AC
  Administered 2022-04-13: 50 ug via INTRAVENOUS

## 2022-04-12 NOTE — ED Triage Notes (Signed)
Pt BIB EMS from home after tripping over the rug and falling into a glass table. Pt has two small lacerations on left lower back, bleeding controlled at this time. Pt denies LOC or blood thinners. Pt has c-collar in place.

## 2022-04-12 NOTE — ED Provider Notes (Signed)
[facility] DEPT Provider Note   CSN: [id number] Arrival date & time: 04/12/22  2248     History  Chief Complaint  Patient presents with   [name] [name] is a 81 y.o. male.  The history is provided by the patient and medical records.  Fall  [name] [name] is a 81 y.o. male who presents to the Emergency Department complaining of fall.  He presents to the emergency department by EMS for evaluation of injuries following a fall that occurred around 5 PM today.  He was at home and he believes he tripped over a rug and fell backwards, landing on a glass coffee table.  He did slightly slow his fall but still broke the table.  He states there were very large shards of glass.  He was able to get himself back up and walk to a neighbor's house who is a Marine scientist.  They were able to stop the bleeding at that time.  Later today the bleeding returned and was a fair amount so he presents for evaluation.  No head injury or loss of consciousness.  No recent illnesses.  He takes a baby aspirin.  No hip pain.  He is able to ambulate without difficulty.     Home Medications Prior to Admission medications   Medication Sig Start Date End Date Taking? Authorizing Provider  allopurinol (ZYLOPRIM) 300 MG tablet TAKE 1 TABLET BY MOUTH EVERY DAY Patient taking differently: Take 300 mg by mouth daily. 01/26/22  Yes [name], MD  aspirin 81 MG chewable tablet Chew 81 mg by mouth daily.   Yes [provider]  carvedilol (COREG) 12.5 MG tablet TAKE 1 TABLET BY MOUTH TWICE A DAY Patient taking differently: Take 12.5 mg by mouth 2 (two) times daily with a meal. 04/05/22  Yes [name], [name], MD  diphenhydramine-acetaminophen (TYLENOL PM) 25-500 MG TABS tablet Take 1 tablet by mouth at bedtime as needed.   Yes [provider]  furosemide (LASIX) 40 MG tablet TAKE 1 TABLET (40 MG TOTAL) BY MOUTH DAILY AS NEEDED FOR FLUID OR EDEMA. 12/30/21 12/30/22 Yes [name], [name], MD   losartan (COZAAR) 100 MG tablet Take 1 tablet (100 mg total) by mouth daily. 10/31/21  Yes [name], [name], MD  meloxicam (MOBIC) 7.5 MG tablet Take 7.5 mg by mouth daily. 10/26/21  Yes [provider]  metFORMIN (GLUCOPHAGE) 500 MG tablet TAKE 1 TABLET BY MOUTH EVERY DAY WITH BREAKFAST Patient taking differently: Take 500 mg by mouth daily with breakfast. 10/18/21  Yes [name], [name], MD  Omega-3 Fatty Acids (FISH OIL) 1000 MG CAPS Take 1,000 mg by mouth daily.   Yes [provider]  potassium chloride (KLOR-CON M10) 10 MEQ tablet Take 1 tablet (10 mEq total) by mouth daily. 10/31/21  Yes [name], [name], MD  rosuvastatin (CRESTOR) 10 MG tablet TAKE 1 TABLET BY MOUTH EVERY DAY Patient taking differently: Take 10 mg by mouth daily. 10/18/21  Yes Bensimhon, [name], MD  spironolactone (ALDACTONE) 25 MG tablet Take 0.5 tablets (12.5 mg total) by mouth daily. 09/27/21  Yes Bensimhon, [name], MD      Allergies    Doxycycline    Review of Systems   Review of Systems  All other systems reviewed and are negative.   Physical Exam Updated Vital Signs BP (!) 142/68   Pulse 63   Temp 98 F (36.7 C)   Resp 16   Ht 6' (  1.829 m)   Wt (!) 140 kg   SpO2 97%   BMI 41.86 kg/m  Physical Exam Vitals and nursing note reviewed.  Constitutional:      Appearance: He is well-developed.  HENT:     Head: Normocephalic and atraumatic.  Cardiovascular:     Rate and Rhythm: Normal rate and regular rhythm.     Heart sounds: No murmur heard. Pulmonary:     Effort: Pulmonary effort is normal. No respiratory distress.     Breath sounds: Normal breath sounds.  Abdominal:     Palpations: Abdomen is soft.     Tenderness: There is no abdominal tenderness. There is no guarding or rebound.  Musculoskeletal:     Comments: Mild midline upper and mid lumbar tenderness to palpation without any deformity.  There is soft tissue swelling to the left CVA region with a 1-1/2 cm triangular-shaped wound that is  hemostatic  Skin:    General: Skin is warm and dry.  Neurological:     Mental Status: He is alert and oriented to person, place, and time.  Psychiatric:        Behavior: Behavior normal.     ED Results / Procedures / Treatments   Labs (all labs ordered are listed, but only abnormal results are displayed) Labs Reviewed  BASIC METABOLIC PANEL - Abnormal; Notable for the following components:      Result Value   Glucose, Bld 124 (*)    Calcium 8.7 (*)    All other components within normal limits  I-STAT CHEM 8, ED - Abnormal; Notable for the following components:   Glucose, Bld 117 (*)    Calcium, Ion 1.14 (*)    Hemoglobin 12.9 (*)    HCT 38.0 (*)    All other components within normal limits  CBC WITH DIFFERENTIAL/PLATELET    EKG None  Radiology CT Abdomen Pelvis W Contrast  Result Date: 04/13/2022 CLINICAL DATA:  Abdominal trauma, penetrating, fall. Posterior left flank wound from large piece of glass. Concern for retroperitoneal injury. EXAM: CT ABDOMEN AND PELVIS WITH CONTRAST TECHNIQUE: Multidetector CT imaging of the abdomen and pelvis was performed using the standard protocol following bolus administration of intravenous contrast. RADIATION DOSE REDUCTION: This exam was performed according to the departmental dose-optimization program which includes automated exposure control, adjustment of the mA and/or kV according to patient size and/or use of iterative reconstruction technique. CONTRAST:  169m OMNIPAQUE IOHEXOL 300 MG/ML  SOLN COMPARISON:  02/23/2020. FINDINGS: Lower chest: Scattered coronary artery calcifications. Mild atelectasis is present at the lung bases. A 5 mm subpleural nodule is noted in the right lower lobe, axial image 6, unchanged from the prior exam. Hepatobiliary: No focal liver abnormality is seen. No gallstones, gallbladder wall thickening, or biliary dilatation. Pancreas: Unremarkable. No pancreatic ductal dilatation or surrounding inflammatory changes.  Spleen: Normal in size without focal abnormality. Adrenals/Urinary Tract: No adrenal nodule or mass. The kidneys enhance symmetrically. A large cyst is present in the left kidney measuring up to 12.9 cm. No renal calculus or hydronephrosis. The bladder is unremarkable. Stomach/Bowel: Stomach is within normal limits. Appendix appears normal. No evidence of bowel wall thickening, distention, or inflammatory changes. No free air or pneumatosis. Vascular/Lymphatic: Aortic atherosclerosis. No enlarged abdominal or pelvic lymph nodes. Reproductive: The prostate gland is enlarged. Other: No abdominal wall hernia or abnormality. No abdominopelvic ascites. Musculoskeletal: Mild subcutaneous fat stranding and small skin defect are noted over the left flank. No large hematoma or radiopaque foreign body. No intramuscular involvement.  Degenerative changes are present in the thoracolumbar spine and bilateral hips. No acute fracture. IMPRESSION: 1. Mild subcutaneous fat stranding and skin defect over the left flank, compatible with history of penetrating trauma. No hematoma or radiopaque foreign body. 2. Left renal cyst. 3. Stable right lower lobe pulmonary nodule, unchanged from 2021. 4. Enlarged prostate gland. 5. Aortic atherosclerosis. Electronically Signed   By: Brett Fairy M.D.   On: 04/13/2022 01:34    Procedures .Marland KitchenLaceration Repair  Date/Time: 04/13/2022 2:11 AM  Performed by: Quintella Reichert, MD Authorized by: Quintella Reichert, MD   Consent:    Consent obtained:  Verbal   Consent given by:  Patient   Risks discussed:  Infection and pain Universal protocol:    Patient identity confirmed:  Verbally with patient Anesthesia:    Anesthesia method:  Local infiltration   Local anesthetic:  Lidocaine 1% WITH epi Laceration details:    Location:  Trunk   Trunk location:  Lower back   Length (cm):  1.5 Exploration:    Limited defect created (wound extended): no     Hemostasis achieved with:  Direct  pressure Treatment:    Area cleansed with:  Chlorhexidine and saline   Amount of cleaning:  Standard   Irrigation solution:  Sterile saline   Debridement:  None Skin repair:    Repair method:  Staples   Number of staples:  1 Approximation:    Approximation:  Close Repair type:    Repair type:  Simple Post-procedure details:    Dressing:  Antibiotic ointment     Medications Ordered in ED Medications  lidocaine-EPINEPHrine (XYLOCAINE W/EPI) 2 %-1:100000 (with pres) injection 20 mL (has no administration in time range)  bacitracin ointment (has no administration in time range)  fentaNYL (SUBLIMAZE) injection 50 mcg (50 mcg Intravenous Given 04/13/22 0006)  iohexol (OMNIPAQUE) 300 MG/ML solution 100 mL (100 mLs Intravenous Contrast Given 04/13/22 0112)    ED Course/ Medical Decision Making/ A&P                           Medical Decision Making Amount and/or Complexity of Data Reviewed Labs: ordered. Radiology: ordered.  Risk Prescription drug management.   Patient here for evaluation of injuries following a mechanical fall that occurred earlier today.  No head injury or loss of consciousness.  He does have a 1-1/2 cm wound to the left flank, unclear how deep this is is unable to visualize the base.  He does state that the glass that broke had very large shards.  Given concern for potentially deep wound will check a CT scan to rule out deeper injury.  CT scan without evidence of deep injury.  Wound repaired per note.  Feel patient is stable for outpatient follow-up with return precautions.         Final Clinical Impression(s) / ED Diagnoses Final diagnoses:  Fall, initial encounter  Laceration of left side of back, initial encounter    Rx / DC Orders ED Discharge Orders     None         Quintella Reichert, MD 04/13/22 [phone]

## 2022-04-13 ENCOUNTER — Encounter (HOSPITAL_COMMUNITY): Payer: Self-pay

## 2022-04-13 ENCOUNTER — Emergency Department (HOSPITAL_COMMUNITY): Payer: HMO

## 2022-04-13 DIAGNOSIS — S31109A Unspecified open wound of abdominal wall, unspecified quadrant without penetration into peritoneal cavity, initial encounter: Secondary | ICD-10-CM | POA: Diagnosis not present

## 2022-04-13 LAB — BASIC METABOLIC PANEL
Anion gap: 9 (ref 5–15)
BUN: 20 mg/dL (ref 8–23)
CO2: 26 mmol/L (ref 22–32)
Calcium: 8.7 mg/dL — ABNORMAL LOW (ref 8.9–10.3)
Chloride: 104 mmol/L (ref 98–111)
Creatinine, Ser: 1.16 mg/dL (ref 0.61–1.24)
GFR, Estimated: >60 mL/min (ref >60)
Glucose, Bld: 124 mg/dL — ABNORMAL HIGH (ref 70–99)
Potassium: 4 mmol/L (ref 3.5–5.1)
Sodium: 139 mmol/L (ref 135–145)

## 2022-04-13 MED ORDER — LIDOCAINE-EPINEPHRINE 2 %-1:100000 IJ SOLN
20.0000 mL | Freq: Once | INTRAMUSCULAR | Status: AC
  Administered 2022-04-13: 20 mL
  Filled 2022-04-13: qty 1

## 2022-04-13 MED ORDER — BACITRACIN ZINC 500 UNIT/GM EX OINT
TOPICAL_OINTMENT | Freq: Two times a day (BID) | CUTANEOUS | Status: DC
Start: 2022-04-13 — End: 2022-04-13
  Filled 2022-04-13: qty 0.9

## 2022-04-13 MED ORDER — IOHEXOL 300 MG/ML  SOLN
100.0000 mL | Freq: Once | INTRAMUSCULAR | Status: AC | PRN
  Administered 2022-04-13: 100 mL via INTRAVENOUS

## 2022-04-13 NOTE — ED Notes (Signed)
Suture cart in room

## 2022-04-13 NOTE — Discharge Instructions (Signed)
You have 1 staple in your back that will need to be removed in the next 7 to 10 days.  This can happen at your primary care doctor's office, urgent care or the emergency department.  Get rechecked sooner if you have signs of infection.  You had a CT scan performed in the emergency department that showed incidental findings of a cyst on your kidney, a lung nodule, and enlarged prostate and atherosclerosis of your aorta.  Please let your doctor know that you had a CT scan performed so you can have your routine follow-ups regarding these  findings.

## 2022-04-26 ENCOUNTER — Other Ambulatory Visit: Payer: Self-pay | Admitting: Internal Medicine

## 2022-05-03 ENCOUNTER — Encounter: Payer: Self-pay | Admitting: Hematology

## 2022-05-08 DIAGNOSIS — I509 Heart failure, unspecified: Secondary | ICD-10-CM | POA: Diagnosis not present

## 2022-05-08 DIAGNOSIS — Z515 Encounter for palliative care: Secondary | ICD-10-CM | POA: Diagnosis not present

## 2022-05-08 DIAGNOSIS — E119 Type 2 diabetes mellitus without complications: Secondary | ICD-10-CM | POA: Diagnosis not present

## 2022-05-08 DIAGNOSIS — Z7984 Long term (current) use of oral hypoglycemic drugs: Secondary | ICD-10-CM | POA: Diagnosis not present

## 2022-05-09 ENCOUNTER — Ambulatory Visit (INDEPENDENT_AMBULATORY_CARE_PROVIDER_SITE_OTHER): Payer: PPO | Admitting: Internal Medicine

## 2022-05-09 ENCOUNTER — Encounter: Payer: Self-pay | Admitting: Internal Medicine

## 2022-05-09 VITALS — BP 134/84 | HR 54 | Temp 98.4°F | Resp 18 | Ht 72.0 in | Wt 304.2 lb

## 2022-05-09 DIAGNOSIS — E1169 Type 2 diabetes mellitus with other specified complication: Secondary | ICD-10-CM | POA: Diagnosis not present

## 2022-05-09 DIAGNOSIS — F439 Reaction to severe stress, unspecified: Secondary | ICD-10-CM | POA: Diagnosis not present

## 2022-05-09 DIAGNOSIS — R935 Abnormal findings on diagnostic imaging of other abdominal regions, including retroperitoneum: Secondary | ICD-10-CM | POA: Diagnosis not present

## 2022-05-09 DIAGNOSIS — E785 Hyperlipidemia, unspecified: Secondary | ICD-10-CM | POA: Diagnosis not present

## 2022-05-09 LAB — LIPID PANEL
Cholesterol: 141 mg/dL (ref 0–200)
HDL: 35.3 mg/dL — ABNORMAL LOW (ref >39.00)
NonHDL: 105.21
Total CHOL/HDL Ratio: 4
Triglycerides: 348 mg/dL — ABNORMAL HIGH (ref 0.0–149.0)
VLDL: 69.6 mg/dL — ABNORMAL HIGH (ref 0.0–40.0)

## 2022-05-09 LAB — HEMOGLOBIN A1C: Hgb A1c MFr Bld: 6.7 % — ABNORMAL HIGH (ref 4.6–6.5)

## 2022-05-09 LAB — LDL CHOLESTEROL, DIRECT: Direct LDL: 69 mg/dL

## 2022-05-09 NOTE — Patient Instructions (Addendum)
Recommend to proceed with covid booster (bivalent) at your pharmacy. Flu shot this fall.   Continue checking your blood pressures regularly BP GOAL is between 110/65 and  135/85. If it is consistently higher or lower, let me know  Watch your diet closely  GO TO THE LAB : Get the blood work     [place], [place] back for a checkup in 4 to 5 months

## 2022-05-09 NOTE — Assessment & Plan Note (Signed)
DM: Well-controlled, last A1c 6.7.  Diet has not been good in the last few months but he is trying to correct that.  Check A1c, continue metformin. HTN: On carvedilol, Lasix, losartan, potassium, Aldactone.  Ambulatory BPs all below 140/90.  No change.  Recent BMP okay. Abnormal CT abdomen: Went to the ER after a mechanical fall, he is recuperated from that but the work-up included an abdominal CT that showed left renal cyst (was not recommended a follow-up imaging), also aortic sclerosis (we will try to get LDL to less than 70) High cholesterol: On Crestor 10 mg, recheck labs, see above, will try to get LDL around 70. Stress: Brother passed away recently, brother-in-law is on life support.  Condolences and listening therapy provided, knows to call if needed. Preventive care: Vaccines recommended, see AVS RTC 4 to 5 months

## 2022-05-09 NOTE — Progress Notes (Signed)
Subjective:    Patient ID: [name] [name], male    DOB: [birth year], 81 y.o.   MRN: [id number]  DOS:  05/09/2022 Type of visit - description: Follow-up  Went to the ER after a mechanical fall, no head  injury, no LOC. Blood work was essentially okay, CT abdomen showed several abnormalities: L renal cyst Stable R LL pulmonary nodules Enlarged prostate Aortic sclerosis  Since the ER visit, he has no residual pains or aches. We reviewed his chronic medical problems.  When he went to oncology, blood sugar was 208, he was not eating healthy then but he is trying to eat healthy now.  Ambulatory BPs controlled.  Review of Systems See above   Past Medical History:  Diagnosis Date   Arthritis    Complication of anesthesia     had spinal with knee surgeries  -"heart rate too low" for general   Detached retina, left    L eye normal vision, reports that he has had a retina procedure in a doctor's office at [facility]    Diabetes mellitus without complication ([place])    Dysrhythmia    slight irregular rate   GERD (gastroesophageal reflux disease)    hx no problems now   H/O echocardiogram 2014   History of hiatal hernia    ?20 yrs ago   Hyperopia 2016   Hypertension    Lumbar stenosis with neurogenic claudication    MG, 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 of L eye,    Prediabetes    Presbyopia OU 2016    Past Surgical History:  Procedure Laterality Date   COLONOSCOPY     HERNIA REPAIR     bilateral inguinal hernia   HERNIA REPAIR     umbilical   IR IMAGING GUIDED PORT INSERTION  09/16/2019   IR REMOVAL TUN ACCESS W/ PORT W/O FL MOD SED  03/04/2020   JOINT REPLACEMENT  2009   right knee   LUMBAR LAMINECTOMY/DECOMPRESSION MICRODISCECTOMY N/A 06/06/2017   Procedure: Decompression L3-5, insitu fusion L3-5 ;  Surgeon: Melina Schools, MD;  Location: [name];  Service: Orthopedics;  Laterality: N/A;   LYMPH NODE BIOPSY Right 09/01/2019   Procedure: CERVICAL LYMPH NODE BIOPSY;  Surgeon:  [name], MD;  Location: [facility];  Service: ENT;  Laterality: Right;   MIDDLE EAR SURGERY Right    ear -patched hole in ear drum   RIGHT/LEFT HEART CATH AND CORONARY ANGIOGRAPHY N/A 11/11/2019   Procedure: RIGHT/LEFT HEART CATH AND CORONARY ANGIOGRAPHY;  Surgeon: Jolaine Artist, MD;  Location: [facility];  Service: Cardiovascular;  Laterality: N/A;   TOTAL KNEE ARTHROPLASTY  09/10/2012   Procedure: TOTAL KNEE ARTHROPLASTY;  Surgeon: Tobi Bastos, MD;  Location: [facility];  Service: Orthopedics;  Laterality: Left;   TOTAL SHOULDER ARTHROPLASTY Right 06/22/2016   Procedure: RIGHT TOTAL SHOULDER ARTHROPLASTY;  Surgeon: [name], MD;  Location: [facility];  Service: Orthopedics;  Laterality: Right;    Current Outpatient Medications  Medication Instructions   allopurinol (ZYLOPRIM) 300 MG tablet TAKE 1 TABLET BY MOUTH EVERY DAY   aspirin 81 mg, Oral, Daily   carvedilol (COREG) 12.5 MG tablet TAKE 1 TABLET BY MOUTH TWICE A DAY   diphenhydramine-acetaminophen (TYLENOL PM) 25-500 MG TABS tablet 1 tablet, Oral, At bedtime PRN   Fish Oil 1,000 mg, Oral, Daily   furosemide (LASIX) 40 mg, Oral, Daily PRN   losartan (COZAAR) 100 MG tablet TAKE 1 TABLET BY MOUTH EVERY DAY  meloxicam (MOBIC) 7.5 mg, Oral, Daily   metFORMIN (GLUCOPHAGE) 500 MG tablet TAKE 1 TABLET BY MOUTH EVERY DAY WITH BREAKFAST   potassium chloride (KLOR-CON M10) 10 MEQ tablet 10 mEq, Oral, Daily   rosuvastatin (CRESTOR) 10 MG tablet TAKE 1 TABLET BY MOUTH EVERY DAY   spironolactone (ALDACTONE) 12.5 mg, Oral, Daily       Objective:   Physical Exam BP 134/84   Pulse (!) 54   Temp 98.4 F (36.9 C) (Oral)   Resp 18   Ht 6' (1.829 m)   Wt (!) 304 lb 4 oz (138 kg)   SpO2 98%   BMI 41.26 kg/m  General:   Well developed, NAD, BMI noted. HEENT:  Normocephalic . Face symmetric, atraumatic Lungs:  CTA B Normal respiratory effort, no intercostal retractions, no accessory muscle use. Heart: RRR,  no murmur.   Lower extremities: no pretibial edema bilaterally  Skin: Not pale. Not jaundice Neurologic:  alert & oriented X3.  Speech normal, gait and transferring somewhat difficult due to DJD.  Uses a cane. Psych--  Cognition and judgment appear intact.  Cooperative with normal attention span and concentration.  Behavior appropriate. No anxious or depressed appearing.      Assessment     Assessment DM + Neuropathy Dx 01-2021 HTN Hyperlipidemia Gout  Morbid obesity Snoring  CV: -Palpitations, PVCs, h/o bradycardia,s/p. Dr Adin Hector day monitor, echo 2014: Normal LV, some LVH  - New onset acute syst CHF 10/2019>>  S/p catheterization 11/11/2019: No CAD: Normal EF. 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  Diet has not been good in the last few months but he is trying to correct that.  Check A1c, continue metformin. HTN: On carvedilol, Lasix, losartan, potassium, Aldactone.  Ambulatory BPs all below 140/90.  No change.  Recent BMP okay. Abnormal CT abdomen: Went to the ER after a mechanical fall, he is recuperated from that but the work-up included an abdominal CT that showed left renal cyst (was not recommended a follow-up imaging), also aortic sclerosis (we will try to get LDL to less than 70) High cholesterol: On Crestor 10 mg, recheck labs, see above, will try to get LDL around 70. Stress: Brother passed away recently, brother-in-law is on life support.  Condolences and listening therapy provided, knows to call if needed. Preventive care: Vaccines recommended, see AVS RTC 4 to 5 months

## 2022-05-10 ENCOUNTER — Encounter: Payer: Self-pay | Admitting: Internal Medicine

## 2022-05-11 ENCOUNTER — Telehealth: Payer: Self-pay | Admitting: Internal Medicine

## 2022-05-11 DIAGNOSIS — R269 Unspecified abnormalities of gait and mobility: Secondary | ICD-10-CM

## 2022-05-11 DIAGNOSIS — R296 Repeated falls: Secondary | ICD-10-CM

## 2022-05-11 DIAGNOSIS — G629 Polyneuropathy, unspecified: Secondary | ICD-10-CM

## 2022-05-11 NOTE — Telephone Encounter (Signed)
Bonita with [employer] called to provide an FYI that patient has fallen twice in the last month and they wanted the provider to be aware. Patient may benefit from OT/PT. If you have questions, please call Doroteo Bradford at [phone]

## 2022-05-11 NOTE — Telephone Encounter (Signed)
Just an FYI

## 2022-05-12 NOTE — Telephone Encounter (Signed)
PT referral placed.

## 2022-05-12 NOTE — Telephone Encounter (Signed)
Arrange for PT/OT, DX gait disorder

## 2022-05-16 ENCOUNTER — Ambulatory Visit: Payer: HMO | Admitting: Internal Medicine

## 2022-05-31 ENCOUNTER — Ambulatory Visit (INDEPENDENT_AMBULATORY_CARE_PROVIDER_SITE_OTHER): Payer: HMO | Admitting: Pharmacist

## 2022-05-31 ENCOUNTER — Encounter: Payer: Self-pay | Admitting: Hematology

## 2022-05-31 DIAGNOSIS — R269 Unspecified abnormalities of gait and mobility: Secondary | ICD-10-CM

## 2022-05-31 DIAGNOSIS — R296 Repeated falls: Secondary | ICD-10-CM

## 2022-05-31 DIAGNOSIS — E1159 Type 2 diabetes mellitus with other circulatory complications: Secondary | ICD-10-CM

## 2022-05-31 DIAGNOSIS — E1169 Type 2 diabetes mellitus with other specified complication: Secondary | ICD-10-CM

## 2022-05-31 DIAGNOSIS — I152 Hypertension secondary to endocrine disorders: Secondary | ICD-10-CM

## 2022-05-31 MED ORDER — ONETOUCH VERIO FLEX SYSTEM W/DEVICE KIT: PACK | 0 refills | Status: DC

## 2022-05-31 MED ORDER — ONETOUCH DELICA PLUS LANCET33G MISC: 3 refills | Status: DC

## 2022-05-31 MED ORDER — ONETOUCH VERIO VI STRP: ORAL_STRIP | 3 refills | Status: AC

## 2022-05-31 NOTE — Chronic Care Management (AMB) (Signed)
Chronic Care Management Pharmacy Note  05/31/2022 Name:  [name] [name] MRN:  [id number] DOB:  August 28, 1941  Summary: Hypertension: home blood pressure has been at goal.  DM: last A1c at goal. Prescribed One Touch Verio glucometer to check blood glucose at home per patient request.  Hyperlipidemia: LDL at goal but triglycerides elevated - possibly related to dietary indiscretion. Recommended continue rosuvastatin and increase fish oil to 1031m twice a day. Discussed dietary ways to lower triglycerides -written information provided.  Medication adherence - improved. Has filled medications on time so far in 2023. Patient endorses taking medications as prescribed.  Falls: patient has fall that required ED visit 04/2022. Was "carrying too much" and tripped over rug. He has since removed rug from home. Dr PLarose Kellsordered PT for balance / strength but patient postponed. Encouraged him to start PT. Provided phone number to PT. Also discussed fall prevention and tip sheet mailed to patient.   Plan: Will continue to follow adherence with interventions as needed.    Subjective: [name] CASSADAis an 81y.o. year old male who is a primary patient of [name] [name], [name] Berthold MD.  The CCM team was consulted for assistance with disease management and care coordination needs.    Engaged with patient by telephone for follow up visit in response to provider referral for pharmacy case management and/or care coordination services.   Consent to Services:  The patient was given information about Chronic Care Management services, agreed to services, and gave verbal consent prior to initiation of services.  Please see initial visit note for detailed documentation.   Patient Care Team: [name] Branch MD as PCP - General [name] [name], PWonda Cheng MD as PCP - Cardiology (Cardiology) BRonald Lobo MD as Consulting Physician (Gastroenterology) EMarin OlpPRudell Cobb MD as Medical Oncologist (Oncology) GMelissa [name] OBradleyas Referring Physician  (Optometry) ECherre [name] RPH-CPP (Pharmacist) [name] [name], DShaune Pascal MD as Consulting Physician (Cardiology) MGardiner [name] DPM as Consulting Physician (Podiatry)  Recent office visits: 05/11/2022 - Phone Call - Request form [facility]TA for PT / OT due to recent falls. Order placed.  05/09/2022 - Int Med (Dr PLarose [name] Follow up chronic conditions. 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 LDL was 69. No med changes noteds. F/U 4 to 5 months.  12/14/2021 - PCP (Dr PLarose [name] Follow up chronic conditions. Labs checked. No medication changes noted.   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 F/U 6 months.  03/31/2022 - Podiatry (Dr MPrudence Davidson Seen for onychomycosis of toenails of both feet in diabetic. No med changes noted.  10/04/2021 - Hem/Onc (Dr EMarin [name] Seen for antioimmuniblastic lymphoma. No medicaiton changes.    [facility]: [facility]. No LOC. CT ordered for wound on left flank - revealed no deep injury. No med changes noted.   Objective:  Lab Results  Component Value Date   CREATININE 1.20 04/12/2022   CREATININE 1.16 04/12/2022   CREATININE 1.23 04/04/2022    Lab Results  Component Value Date   HGBA1C 6.7 (H) 05/09/2022   Last diabetic Eye exam:  Lab Results  Component Value Date/Time   HMDIABEYEEXA No Retinopathy 12/01/2021 12:00 AM    Last diabetic Foot exam:  Lab Results  Component Value Date/Time   HMDIABFOOTEX Done 06/12/2019 12:00 AM        Component Value Date/Time  CHOL 141 05/09/2022 0916   TRIG 348.0 (H) 05/09/2022 0916   TRIG 247 (HH) 08/30/2006 1117   HDL 35.30 (L) 05/09/2022 0916   CHOLHDL 4 05/09/2022 0916   VLDL 69.6 (H) 05/09/2022 0916   LDLCALC 39 10/14/2019 0526   LDLDIRECT 69.0 05/09/2022 0916       Latest Ref Rng & Units 04/04/2022    8:56 AM  10/04/2021    9:23 AM 04/04/2021    8:57 AM  Hepatic Function  Total Protein 6.5 - 8.1 g/dL 6.3  6.9  6.6   Albumin 3.5 - 5.0 g/dL 4.3  4.5  4.5   AST 15 - 41 U/L 12  12  13    ALT 0 - 44 U/L 12  17  13    Alk Phosphatase 38 - 126 U/L 67  72  76   Total Bilirubin 0.3 - 1.2 mg/dL 0.6  0.7  0.7     Lab Results  Component Value Date/Time   TSH 1.28 12/14/2021 09:23 AM   TSH 2.07 09/23/2019 09:55 AM       Latest Ref Rng & Units 04/12/2022   11:52 PM 04/12/2022   11:43 PM 04/04/2022    8:56 AM  CBC  WBC 4.0 - 10.5 K/uL  10.0  6.8   Hemoglobin 13.0 - 17.0 g/dL 12.9  13.2  13.2   Hematocrit 39.0 - 52.0 % 38.0  40.1  39.2   Platelets 150 - 400 K/uL  239  203     No results found for: "VD25OH"  Clinical ASCVD: No  The ASCVD Risk score (Arnett DK, et al., 2019) failed to calculate for the following reasons:   The patient has a prior MI or stroke diagnosis     Social History   Tobacco Use  Smoking Status Former   Packs/day: 0.50   Years: 2.00   Total pack years: 1.00   Types: Cigarettes   Quit date: 06/09/1966   Years since quitting: 56.0  Smokeless Tobacco Never   BP Readings from Last 3 Encounters:  05/09/22 134/84  04/13/22 (!) 142/68  04/04/22 (!) 150/69   Pulse Readings from Last 3 Encounters:  05/09/22 (!) 54  04/13/22 (!) 103  04/04/22 63   Wt Readings from Last 3 Encounters:  05/09/22 (!) 304 lb 4 oz (138 kg)  04/12/22 (!) 308 lb 10.3 oz (140 kg)  04/04/22 (!) 308 lb 12 oz (140 kg)    Assessment: Review of patient past medical history, allergies, medications, health status, including review of consultants reports, laboratory and other test data, was performed as part of comprehensive evaluation and provision of chronic care management services.   SDOH:  (Social Determinants of Health) assessments and interventions performed:     CCM Care Plan  Allergies  Allergen Reactions   Doxycycline Rash    Medications Reviewed Today     Reviewed by [name] [name], RPH-CPP (Pharmacist) on 05/31/22 at 1027  Med List Status: <None>   Medication Order Taking? Sig Documenting Provider Last Dose Status Informant  allopurinol (ZYLOPRIM) 300 MG tablet [id number] Yes TAKE 1 TABLET BY MOUTH EVERY DAY  Patient taking differently: Take 300 mg by mouth daily.   [name], MD Taking Active Self  aspirin 81 MG chewable tablet [id number] Yes Chew 81 mg by mouth daily. [provider] Taking Active Self  carvedilol (COREG) 12.5 MG tablet [id number] Yes TAKE 1 TABLET BY MOUTH TWICE A DAY  Patient taking differently: Take 12.5 mg by mouth 2 (  two) times daily with a meal.   Colon Branch, MD Taking Active Self  diphenhydramine-acetaminophen (TYLENOL PM) 25-500 MG TABS tablet [id number]  Take 1 tablet by mouth at bedtime as needed. [provider]  Active Self  furosemide (LASIX) 40 MG tablet [id number] Yes TAKE 1 TABLET (40 MG TOTAL) BY MOUTH DAILY AS NEEDED FOR FLUID OR EDEMA. Colon Branch, MD Taking Active Self  losartan (COZAAR) 100 MG tablet [id number] Yes TAKE 1 TABLET BY MOUTH EVERY DAY Colon Branch, MD Taking Active   meloxicam (MOBIC) 7.5 MG tablet [id number] Yes Take 7.5 mg by mouth daily. [provider] Taking Active Self           Med Note Dema Severin, Roxy Cedar Apr 12, 2022 11:54 PM)    metFORMIN (GLUCOPHAGE) 500 MG tablet [id number] Yes TAKE 1 TABLET BY MOUTH EVERY DAY WITH BREAKFAST Colon Branch, MD Taking Active   Omega-3 Fatty Acids (FISH OIL) 1000 MG CAPS [id number] Yes Take 1,000 mg by mouth daily. [provider] Taking Active Self           Med Note (HILL, TIFFANY A   Fri May 28, 2020 10:20 AM)    potassium chloride (KLOR-CON M10) 10 MEQ tablet [id number] Yes Take 1 tablet (10 mEq total) by mouth daily. Colon Branch, MD Taking Active Self  rosuvastatin (CRESTOR) 10 MG tablet [id number] Yes TAKE 1 TABLET BY MOUTH EVERY DAY  Patient taking differently: Take 10 mg by mouth daily.   [name] [name], [name] Pascal, MD Taking Active Self   spironolactone (ALDACTONE) 25 MG tablet [id number] Yes Take 0.5 tablets (12.5 mg total) by mouth daily. [name] [name], [name] Pascal, MD Taking Active Self           Med Note Antony Contras, Sandre Kitty   Tue Feb 07, 2022  2:06 PM)              Patient Active Problem List   Diagnosis Date Noted   Pain due to onychomycosis of toenails of both feet 03/14/2021   Pincer nail deformity 34/19/6222   Systolic heart failure ([place]) 11/07/2019   Hyponatremia 11/07/2019   Acute CHF (congestive heart failure) ([place]) 10/09/2019   Elevated troponin 10/09/2019   Antineoplastic chemotherapy induced pancytopenia ([place]) 10/09/2019   Anemia due to antineoplastic chemotherapy 10/06/2019   Angioimmunoblastic lymphoma ([place]) 09/08/2019   Stasis dermatitis of both legs 06/21/2019   Diabetes mellitus, type II ([place]) 07/09/2017   Status post lumbar surgery 06/06/2017   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/22/2016   PVC's (premature ventricular contractions) 11/24/2015   PCP notes >>>>>>>>>>>>>>> 06/23/2015   Morbid obesity ([place]) 02/24/2015   Bradycardia 11/19/2013   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   Palpitations 04/19/2013   Annual physical exam 12/07/2011   R calf larger than L, chronic, U/S (-) for DVT 11-2011 12/07/2011   Osteoarthritis 01/30/2008   Hyperlipidemia associated with type 2 diabetes mellitus ([place]) 12/19/2007   Hypertension associated with diabetes ([place]) 12/19/2007   ERECTILE DYSFUNCTION 01/28/2007   GERD 01/28/2007    Immunization History  Administered Date(s) Administered   Fluad Quad(high Dose 65+) 06/20/2019, 10/04/2020   H1N1 11/12/2008   Influenza Split 07/17/2014, 07/31/2018, 10/18/2021   Influenza Whole 08/04/2010   Influenza, High Dose Seasonal PF 10/18/2021   Influenza,inj,Quad PF,6+ Mos 06/23/2015   Influenza-Unspecified 07/23/2013, 07/07/2016, 07/20/2017   Moderna Sars-Covid-2 Vaccination 03/16/2020, 04/14/2020   PNEUMOCOCCAL CONJUGATE-20 12/14/2021   Pneumococcal Conjugate-13 08/12/2014    Pneumococcal Polysaccharide-23 11/12/2008, 05/28/2019   Td 12/19/2007  Tdap 04/04/2018   Zoster Recombinat (Shingrix) 12/20/2018, 05/12/2019   Zoster, Live 10/24/2011    Conditions to be addressed/monitored: HTN, HLD, DMII, and gout / OA; T-Cell lymphoma angioimmunoblastic (in remission) LV dysfunction vs. CHF  Care Plan : General Pharmacy (Adult)  Updates made by [name] [name], RPH-CPP since 05/31/2022 12:00 AM     Problem: Pharmacy Care Plan - Medication and Chronic Care Management   Priority: High  Onset Date: 04/13/2021     Long-Range Goal: Long Range goals of CCM and medication management   Start Date: 04/13/2021  This Visit's Progress: On track  Recent Progress: On track  Priority: High  Note:   Current Barriers:  Unable to maintain control of HTN Does not adhere to prescribed medication regimen Does not maintain contact with provider office  Pharmacist Clinical Goal(s):  Over the next 90 days, patient will achieve control of HTN as evidenced by BP < 140/90 maintain control of hyperlipidemia and CHF as evidenced by maintaining LDL <100 and take medications as prescribed  adhere to prescribed medication regimen as evidenced by refill history and patient self report  through collaboration with PharmD and provider.   Interventions: 1:1 collaboration with Colon Branch, MD regarding development and update of comprehensive plan of care as evidenced by provider attestation and co-signature Inter-disciplinary care team collaboration (see longitudinal plan of care) Comprehensive medication review performed; medication list updated in electronic medical record  Hypertension  BP goal <140/90 BP Readings from Last 3 Encounters:  12/14/21 132/84  10/04/21 (!) 155/84  09/27/21 (!) 144/76  Patient is checking BP at home 3 times per week. 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 Could consider increasing spironolactone to 25mg / day 52muture and stopping hydrochlorothiazide (and possibly potassium supplement).  Maintain current  regimen.  Limit sodium intake to < 2300mg per day69mperlipidemia LDL goal < 100, Triglyceride goal < 150  LDL is at goal but triglycerides are elevated.  05/31/2022 - patient admits to past dietary indiscretion but has improved over the last month. Current regimen:  Rosuvastatin 10mg daily Fi3mil 1000mg daily Int30mntions: Discussed LDL and triglyceride goals. Discussed limiting fried foods other foods that could increase triglycerides. Provided list of foods to limit that can increase triglycerides / blood glucose Reviewed medications for lipids - continue current regimen. Emphasized taking Fish Oil to lower triglycerides. Recommend increasing to 1000mg twice a da23mDiabetes Controlled; A1c goal <7% Does not currently monitor blood glucose at home but patient would like to have glucometer to check blood glucose  Current regimen:  Metformin 500mg daily with 39mkfast Interventions: Continue current regimen Sent in prescription for One Touch Verio Flex glucometer, test strips and [employer]  lancets. Recommended he  check blood glucose up to once a day.  Reviewed home blood glucose goals  Fasting blood glucose goal (before meals) = 80 to 130 Blood glucose goal if checked 2 hours after a meal = less than 180   Hip Pain / increase in falls:  Pain has improved.  Had fall 04/2022 requiring ED visit due to falling into a glass table. Per patient he was carrying too much in his arms and tripped over a rug.  Dr [name] [name] ordered physical therapy but patient postponed. He plans to start in 2 to 3 weeks. Current therapy:  Meloxicam 7.48m daily  Interventions:  Encouraged patient to start physical therapy for balance and strength training to prevent future falls.  Discussed fall prevention - patient has removed rug that caused previous fall; I also encouraged his to remove any other rug in home and reminded him to be cautious of carrying too much at one time - better to carry smaller loads. Provided fall prevention tips   Medication management Current [facility] Adherence improving.  Interventions Comprehensive medication review performed. Recommended he use weekly pill container to help with adherence. Reviewed recent refill history and assessed adherence.  Report any questions or concerns to PharmD and/or provider(s)  Patient Goals/Self-Care Activities Over the next 90 days, patient will:  take medications as prescribed. Consider using weekly pill container as a reminder to take medications.  Increase Fish oil to take 10072mtwice a day Get pill cutter to help with splitting spironolactone tablets.  collaborate with provider on medication access solutions engage in dietary modifications: limit intake of sodium to <23001mer day for heart and blood pressure Limit intake of sweets / sugar, bread, potatoes, pasta, rice and corns - these foods can increase triglycerides and blood glucose Increase intake non starchy vegetables Restart physical therapy for balance and strength - Dr PazLarose [place]ss [phone] Plan: Telephone follow up appointment with care management team member scheduled for:  1 to 2 months          Medication Assistance: None required.  Patient affirms current coverage meets needs.  [id number] [id number]: [phone]: [phone] Up:  Patient agrees to Care Plan and Follow-up.  Plan: Telephone follow up appointment with clinical pharmacist in 2 months - to check adherence  TammCherre RobinsarmD Clinical Pharmacist [facility]-[phone]

## 2022-05-31 NOTE — Patient Instructions (Addendum)
[name] It was a pleasure speaking with you today.  Below is a summary of your health goals and summary of our recent visit. You can also view your updated Chronic Care Management Care plan through your MyChart account.    Patient Goals/Self-Care Activities Over the next 90 days, patient will:  take medications as prescribed. Consider using weekly pill container as a reminder to take medications.  Increase Fish oil to take '1000mg'$  twice a day Get pill cutter to help with splitting spironolactone tablets.  collaborate with provider on medication access solutions engage in dietary modifications: limit intake of sodium to '2300mg'$  per day for heart and blood pressure Limit intake of sweets / sugar, bread, potatoes, pasta, rice and corns - these foods can increase triglycerides and blood glucose Increase intake non starchy vegetables Restart physical therapy for balance and strength - Dr [name] has already sent order to [facility] [phone] I have sent in prescription for One Touch Verio Flex glucometer, test strips and lancets. Recommend checking blood glucose once a day. Please call me if you have questions about how to check blood glucose ([name] [name] [phone])  Home blood glucose goals  Fasting blood glucose goal (before meals) = 80 to 130 Blood glucose goal after a meal = less than 180    As always if you have any questions or concerns especially regarding medications, please feel free to contact me either at the phone number below or with a MyChart message.   [name], PharmD Clinical Pharmacist [place] [phone] (direct line)  [phone] (main office number)   What are Triglycerides and How Can You Lower Your Triglyceride Level and Improve Your Heart Health  Triglycerides are a type of fat found in the blood. High levels of triglycerides can be a risk factor for various health conditions. Food choices are one of  many factors that can impact triglyceride levels. Doctors may advise a person to change their diet in order to help lower their triglyceride levels.   Limiting saturated fats, added sugars, excessive alcohol, and refined carbohydrates can help lower a person's triglyceride levels.  Triglycerides travel through the blood in round particles called lipoproteins. People can consume triglycerides directly through foods that contain fat, such as oil and butter. Additionally, when people consume more calories than they need from other foods, such as carbohydrates, the excess energy is converted to and stored as triglycerides. A doctor may diagnose someone with high triglycerides (also known as hypertriglyceridemia) if their fasting blood triglyceride levels are consistently 150 mg/dl or higher. Some people may be genetically predisposed to high levels of triglycerides. Doctors call this familial hypertriglyceridemia. Blood triglycerides are often higher in males than females and tend to increase with age.    Foods that can help lower triglycerides According to a 2011 fact sheet from the American Heart Association, people should focus on eating the below foods to help manage their triglyceride levels: oily fish, like sardines and salmon non starchy vegetables, especially leafy greens, green beans, cucumbers, tomatoes, zucchini, lettuce, onions, peppers, asparagus, cabbage and squash citrus fruits, and berries low fat or fat-free dairy products, such as cheese, yogurt, and milk high-fiber whole grains, such as quinoa, barley, and brown rice beans, nuts, and seeds, which contain fiber and unsaturated, healthy fats The AHA also advises people to limit the following to improve triglyceride levels: alcohol consumption foods that contain added sugars  - avoid beverages with sugar (soda, sweet tea and juices) or  choose unsweet tea or juices with lower / half the sugar (like Trop50 / [place]ropicana with 50% lower  sugar), desserts (try sugar free pudding or jello or fruits for a snack)  Foods highest in Carbohydrates -potatoes, bread, pasta, rice, crackers and other foods made with flour, rice or potatoes. A serving of rice, potatoes or pasta should be  cup or about 15 gram of carbohydrates per serving.  saturated and trans fats found in animal products and processed foods. Instead choose unsaturated fats from plant oils, nuts, and seeds over Low carbohydrate diet People whose daily calorie allowance regularly contains more than 60% carbohydrates are at higher risk of having high triglycerides, especially if those carbohydrates come primarily from refined grains. If a person eats more calories from carbohydrates than they require, their body will store the excess carbohydrates as fat. A person looking to reduce triglycerides should avoid refined carbohydrates, such as baked goods and foods that contain sugar. Instead choose unrefined high-fiber carbs, such as vegetables, beans, and whole grains. Try to replace high-sugar products with fruits such as berries, which can help reduce sugar cravings. High-fiber diet If a person increases their dietary fiber intake, they can slow the absorption of fat and sugar in the small intestine. This decreases the levels of triglycerides in the blood. Research suggests that adults can lower their triglyceride levels and improve their overall health by increasing their fiber intake. A person can consume more fiber by eating foods such as whole grains, nuts, seeds, vegetables, legumes, cereals, and fruits. Other ways to lower triglycerides Exercise Research from 2014 suggests that regular aerobic exercise can increase  the amount of good cholesterol, or HDL, in a person's blood. This can help to lower triglyceride levels. The U.S. Department of Health and [employer]' physical activity guidelines recommend that a person get at least 150 minutes of aerobic exercise, or 30 minutes  five times a week. Aerobic exercise may include activities such as jogging, cycling, or swimming.    Fall Prevention in the Home, Adult Falls can cause injuries and affect people of all ages. There are many simple things that you can do to make your home safe and to help prevent falls. Ask for help when making these changes, if needed. What actions can I take to prevent falls? General instructions Use good lighting in all rooms. Replace any light bulbs that burn out, turn on lights if it is dark, and use night-lights. Place frequently used items in easy-to-reach places. Lower the shelves around your home if necessary. Set up furniture so that there are clear paths around it. Avoid moving your furniture around. Remove throw rugs and other tripping hazards from the floor. Avoid walking on wet floors. Fix any uneven floor surfaces. Add color or contrast paint or tape to grab bars and handrails in your home. Place contrasting color strips on the first and last steps of staircases. When you use a stepladder, make sure that it is completely opened and that the sides and supports are firmly locked. Have someone hold the ladder while you are using it. Do not climb a closed stepladder. Know where your pets are when moving through your home. What can I do in the bathroom?     Keep the floor dry. Immediately clean up any water that is on the floor. Remove soap buildup in the tub or shower regularly. Use nonskid mats or decals on the floor of the tub or shower. Attach bath mats securely with double-sided, nonslip rug tape.  If you need to sit down while you are in the shower, use a plastic, nonslip stool. Install grab bars by the toilet and in the tub and shower. Do not use towel bars as grab bars. What can I do in the bedroom? Make sure that a bedside light is easy to reach. Do not use oversized bedding that reaches the floor. Have a firm chair that has side arms to use for getting dressed. What  can I do in the kitchen? Clean up any spills right away. If you need to reach for something above you, use a sturdy step stool that has a grab bar. Keep electrical cables out of the way. Do not use floor polish or wax that makes floors slippery. If you must use wax, make sure that it is non-skid floor wax. What can I do with my stairs? Do not leave any items on the stairs. Make sure that you have a light switch at the top and the bottom of the stairs. Have them installed if you do not have them. Make sure that there are handrails on both sides of the stairs. Fix handrails that are broken or loose. Make sure that handrails are as long as the staircases. Install non-slip stair treads on all stairs in your home. Avoid having throw rugs at the top or bottom of stairs, or secure the rugs with carpet tape to prevent them from moving. Choose a carpet design that does not hide the edge of steps on the stairs. Check any carpeting to make sure that it is firmly attached to the stairs. Fix any carpet that is loose or worn. What can I do on the outside of my home? Use bright outdoor lighting. Regularly repair the edges of walkways and driveways and fix any cracks. Remove high doorway thresholds. Trim any shrubbery on the main path into your home. Regularly check that handrails are securely fastened and in good repair. Both sides of all steps should have handrails. Install guardrails along the edges of any raised decks or porches. Clear walkways of debris and clutter, including tools and rocks. Have leaves, snow, and ice cleared regularly. Use sand or salt on walkways during winter months. In the garage, clean up any spills right away, including grease or oil spills. What other actions can I take? Wear closed-toe shoes that fit well and support your feet. Wear shoes that have rubber soles or low heels. Use mobility aids as needed, such as canes, walkers, scooters, and crutches. Review your medicines  with your health care provider. Some medicines can cause dizziness or changes in blood pressure, which increase your risk of falling. Talk with your health care provider about other ways that you can decrease your risk of falls. This may include working with a physical therapist or trainer to improve your strength, balance, and endurance. Where to find more information Centers for Disease Control and Prevention, STEADI: http://www.wolf.info/ National Institute on Aging: http://kim-miller.com/ Contact a health care provider if: You are afraid of falling at home. You feel weak, drowsy, or dizzy at home. You fall at home. Summary There are many simple things that you can do to make your home safe and to help prevent falls. Ways to make your home safe include removing tripping hazards and installing grab bars in the bathroom. Ask for help when making these changes in your home. This information is not intended to replace advice given to you by your health care provider. Make sure you discuss any questions you have  with your health care provider. Document Revised: 07/11/2021 Document Reviewed: 05/12/2020 Elsevier Patient Education  [place] patient verbalized understanding of instructions, educational materials, and care plan provided today and agreed to receive a mailed copy of patient instructions, educational materials, and care plan.

## 2022-06-17 ENCOUNTER — Other Ambulatory Visit: Payer: Self-pay | Admitting: Internal Medicine

## 2022-06-17 ENCOUNTER — Other Ambulatory Visit: Payer: Self-pay | Admitting: Hematology & Oncology

## 2022-06-17 DIAGNOSIS — C8440 Peripheral T-cell lymphoma, not classified, unspecified site: Secondary | ICD-10-CM

## 2022-06-19 ENCOUNTER — Encounter: Payer: Self-pay | Admitting: Hematology

## 2022-06-22 DIAGNOSIS — Z7984 Long term (current) use of oral hypoglycemic drugs: Secondary | ICD-10-CM | POA: Diagnosis not present

## 2022-06-22 DIAGNOSIS — I110 Hypertensive heart disease with heart failure: Secondary | ICD-10-CM | POA: Diagnosis not present

## 2022-06-22 DIAGNOSIS — E785 Hyperlipidemia, unspecified: Secondary | ICD-10-CM

## 2022-06-22 DIAGNOSIS — E1159 Type 2 diabetes mellitus with other circulatory complications: Secondary | ICD-10-CM

## 2022-06-22 DIAGNOSIS — I509 Heart failure, unspecified: Secondary | ICD-10-CM | POA: Diagnosis not present

## 2022-07-05 ENCOUNTER — Ambulatory Visit: Payer: HMO | Admitting: Podiatry

## 2022-07-05 ENCOUNTER — Encounter: Payer: Self-pay | Admitting: Podiatry

## 2022-07-05 DIAGNOSIS — E1169 Type 2 diabetes mellitus with other specified complication: Secondary | ICD-10-CM | POA: Diagnosis not present

## 2022-07-05 DIAGNOSIS — M79675 Pain in left toe(s): Secondary | ICD-10-CM

## 2022-07-05 DIAGNOSIS — B351 Tinea unguium: Secondary | ICD-10-CM | POA: Diagnosis not present

## 2022-07-05 DIAGNOSIS — M79674 Pain in right toe(s): Secondary | ICD-10-CM

## 2022-07-05 DIAGNOSIS — L608 Other nail disorders: Secondary | ICD-10-CM

## 2022-07-05 NOTE — Progress Notes (Signed)
This patient returns to my office for at risk foot care.  This patient requires this care by a professional since this patient will be at risk due to having diabetes type 2. This patient is having problems with his left knee and hip.  This patient is unable to cut nails himself since the patient cannot reach his nails.These nails are painful walking and wearing shoes.  This patient presents for at risk foot care today.  General Appearance  Alert, conversant and in no acute stress.  Vascular  Dorsalis pedis  are palpable  bilaterally. Posterior tibial pulses are absent  B/L. Capillary return is within normal limits  bilaterally. Cold feet  Bilaterally. Absent hair  B/L.  Neurologic  Senn-Weinstein monofilament wire test within normal limits/diminished   bilaterally. Muscle power within normal limits bilaterally.  Nails Thick disfigured discolored nails with subungual debris  Hallux nails. No evidence of bacterial infection or drainage bilaterally.  Orthopedic  No limitations of motion  feet .  No crepitus or effusions noted.  No bony pathology or digital deformities noted.  HAV  B/L.  Skin  normotropic skin with no porokeratosis noted bilaterally.  No signs of infections or ulcers noted.     Onychomycosis  Pain in right toes  Pain in left toes  Consent was obtained for treatment procedures.   Mechanical debridement of nails 1-5  bilaterally performed with a nail nipper.  Filed with dremel without incident.    Return office visit     3 months.                Told patient to return for periodic foot care and evaluation due to potential at risk complications.   Krew Hortman DPM  

## 2022-07-06 DIAGNOSIS — L578 Other skin changes due to chronic exposure to nonionizing radiation: Secondary | ICD-10-CM | POA: Diagnosis not present

## 2022-07-06 DIAGNOSIS — B351 Tinea unguium: Secondary | ICD-10-CM | POA: Diagnosis not present

## 2022-07-06 DIAGNOSIS — L570 Actinic keratosis: Secondary | ICD-10-CM | POA: Diagnosis not present

## 2022-08-02 ENCOUNTER — Ambulatory Visit: Payer: HMO | Admitting: Pharmacist

## 2022-08-02 DIAGNOSIS — E1169 Type 2 diabetes mellitus with other specified complication: Secondary | ICD-10-CM

## 2022-08-02 DIAGNOSIS — R296 Repeated falls: Secondary | ICD-10-CM

## 2022-08-02 DIAGNOSIS — I152 Hypertension secondary to endocrine disorders: Secondary | ICD-10-CM

## 2022-08-02 NOTE — Progress Notes (Signed)
Pharmacy Note  08/02/2022 Name: [name] [name] MRN: [id number] DOB: Feb 16, 1941  Subjective: [name] [name] is a 81 y.o. year old male who is a primary care patient of Colon Branch, MD. Clinical Pharmacist Practitioner referral was placed to assist with medication management and adherence  Engaged with patient by telephone for follow up visit today.  Summary: Hypertension: Patient has recently filled losartan and carvedilol. Has passed [facility] measure for 12/27/2021. Patient reports home blood pressure has been at goal - usually 130's / 70 to 82.  DM: last A1c at goal. Prescribed One Touch Verio glucometer to check blood glucose at home but today [facility], only test strips. Looks [facility] along with glucometer and test strips.  Hyperlipidemia: LDL at goal but triglycerides elevated - possibly related to dietary indiscretion. Patient has been limiting sweets and high carbohydrate foods more over the last 2 months.  Medication adherence - improved. Has filled medications on time so far in 12/27/21. Patient endorses taking medications as prescribed.  Falls: Patient denies fall since fall that required ED visit 04/2022. Was "carrying too much" and tripped over rug. He has since removed rug from home. Dr [name] ordered PT for balance / strength but patient postponed.     SDOH (Social Determinants of Health) assessments and interventions performed:  SDOH Interventions    Flowsheet Row Clinical Support from 03/10/2022 in [place] [facility] from 12/29/2021 in [place] at [facility] from 10/31/2021 in [place] [facility] from 08/29/2021 in [place] at [place] [facility] from 07/25/2021 in [place] at [facility] [facility] from 04/13/2021 in [place] at [place] Interventions        Food Insecurity Interventions Intervention Not Indicated -- -- -- Intervention Not Indicated --  Housing Interventions Intervention Not Indicated -- -- -- -- --  Transportation Interventions Intervention Not Indicated -- -- -- Intervention Not Indicated --  Financial Strain Interventions Intervention Not Indicated Intervention Not Indicated -- -- Intervention Not Indicated Intervention Not Indicated  Physical Activity Interventions Intervention Not Indicated -- Other (Comments)  [plans to start using exercise bike more.] Other (Comments)  [not exercising due to left hip pain,  made appt with ortho today.] -- Other (Comments)  [not exercising due to hip pain,  has f/u with ortho]  Stress Interventions Intervention Not Indicated -- -- -- -- --  Social Connections Interventions Intervention Not Indicated -- -- -- -- --        Objective: Review of patient status, including review of consultants reports, laboratory and other test data, was performed as part of comprehensive evaluation and provision of chronic care management services.   Lab Results  Component Value Date   CREATININE 1.20 04/12/2022   CREATININE 1.16 04/12/2022   CREATININE 1.23 04/04/2022    Lab Results  Component Value Date   HGBA1C 6.7 (H) 05/09/2022       Component Value Date/Time   CHOL 141 05/09/2022 0916   TRIG 348.0 (H) 05/09/2022 0916   TRIG 247 (HH) 08/30/2006 1117   HDL 35.30 (L) 05/09/2022 0916   CHOLHDL 4 05/09/2022 0916   VLDL 69.6 (H) 05/09/2022 0916   LDLCALC 39 10/14/2019 0526  LDLDIRECT 69.0 05/09/2022 0916     Clinical ASCVD: Yes  The ASCVD Risk score (Arnett DK, et al., 2019) failed to calculate for the following reasons:   The 2019 ASCVD risk score is only valid for ages 82 to 7   The patient has a prior MI or stroke diagnosis    BP Readings from Last 3  Encounters:  05/09/22 134/84  04/13/22 (!) 142/68  04/04/22 (!) 150/69     Allergies  Allergen Reactions   Doxycycline Rash    Medications Reviewed Today     Reviewed by [name], RPH-CPP (Pharmacist) on 08/02/22 at 0959  Med List Status: <None>   Medication Order Taking? Sig Documenting Provider Last Dose Status Informant  allopurinol (ZYLOPRIM) 300 MG tablet [id number] Yes TAKE 1 TABLET BY MOUTH EVERY DAY [name], MD Taking Active   aspirin 81 MG chewable tablet [id number] Yes Chew 81 mg by mouth daily. [provider] Taking Active Self  Blood Glucose Monitoring Suppl ([place]) w/Device KIT [id number] Yes Use to check blood glucose once daily (DX: E11.9  type 2 diabetes) Colon Branch, MD Taking Active   carvedilol (COREG) 12.5 MG tablet [id number] Yes TAKE 1 TABLET BY MOUTH TWICE A DAY  Patient taking differently: Take 12.5 mg by mouth 2 (two) times daily with a meal.   Colon Branch, MD Taking Active Self  diphenhydramine-acetaminophen (TYLENOL PM) 25-500 MG TABS tablet [id number] Yes Take 1 tablet by mouth at bedtime as needed. [provider] Taking Active Self  furosemide (LASIX) 40 MG tablet [id number] Yes TAKE 1 TABLET (40 MG TOTAL) BY MOUTH DAILY AS NEEDED FOR FLUID OR EDEMA. Dutch Quint B, FNP Taking Active   glucose blood (ONETOUCH VERIO) test strip [id number] Yes Use to check blood glucose once a day (Dx: 11.9 - type 2 DM) Colon Branch, MD Taking Active   KLOR-CON M10 10 MEQ tablet [id number] Yes TAKE 1 TABLET BY MOUTH EVERY DAY [name], FNP Taking Active   Lancets (ONETOUCH DELICA PLUS EEFEOF12R) [place] [id number] Yes Use to check blood glucose once a day (Dx: 11.9 type 2 DM Colon Branch, MD Taking Active   losartan (COZAAR) 100 MG tablet [id number] Yes TAKE 1 TABLET BY MOUTH EVERY DAY Colon Branch, MD Taking Active   meloxicam (MOBIC) 7.5 MG tablet [id number] Yes Take 7.5 mg by mouth daily. [provider] Taking Active  Self           Med Note Dema Severin, Roxy Cedar Apr 12, 2022 11:54 PM)    metFORMIN (GLUCOPHAGE) 500 MG tablet [id number] Yes TAKE 1 TABLET BY MOUTH EVERY DAY WITH BREAKFAST Colon Branch, MD Taking Active   Omega-3 Fatty Acids (FISH OIL) 1000 MG CAPS [id number] Yes Take 1,000 mg by mouth daily. [provider] Taking Active Self           Med Note ([place], TIFFANY A   Fri May 28, 2020 10:20 AM)    rosuvastatin (CRESTOR) 10 MG tablet [id number] Yes TAKE 1 TABLET BY MOUTH EVERY DAY  Patient taking differently: Take 10 mg by mouth daily.   Bensimhon, [name], MD Taking Active Self  spironolactone (ALDACTONE) 25 MG tablet [id number] Yes Take 0.5 tablets (12.5 mg total) by mouth daily. Bensimhon, [name], MD Taking Active Self           Med Note [facility], Sallyanne Birkhead B   Tue Feb 07, 2022  2:06 PM)  terbinafine (LAMISIL) 250 MG tablet [id number] Yes Take 250 mg by mouth daily. 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   Stasis dermatitis of both legs 06/21/2019   Diabetes mellitus, type II ([place]) 07/09/2017   Status post lumbar surgery 06/06/2017   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  Patient affirms current coverage meets needs.   Assessment:   Type 2 DM - controled Hyperlipidemia - LDL at goal of < 70 but Tg not at goal of < 150.  Hypertension at goal History of fall - no recent falls.  Medication Management - adherence for 2023 good   Plan: [facility]  - They state that he received lancets 06/03/2022 and too early to refill. Called patient back and had him check bag - He has lancets but now states he does not have glucometer. [facility] dispensed on same day as test strips and lancets.  Recommended continue rosuvastatin and increase fish oil to 2032m a day. Reviewed dietary ways to lower triglycerides. Continue to limit sweets and high carbohydrate foods.  Encouraged him to start PT - patient has phone number for PT to make appointment.  Heath Maintenance Addressed - patient reminded to get annual flu vaccine and consider updated COVID.   Follow Up:  Telephone follow up appointment with care management team member scheduled for:      TCherre Robins PharmD Clinical Pharmacist [place] [phone]

## 2022-08-04 NOTE — Patient Instructions (Signed)
[name] [name] It was a pleasure speaking with you today.  Below is a summary of your health goals and summary of our recent visit.   Patient Goals/Self-Care Activities Over the next 90 days, patient will:  take medications as prescribed. Consider using weekly pill container as a reminder to take medications.  Increase Fish oil to take '1000mg'$  twice a day collaborate with provider on medication access solutions engage in dietary modifications: limit intake of sodium to '2300mg'$  per day for heart and blood pressure Limit intake of sweets / sugar, bread, potatoes, pasta, rice and corns - these foods can increase triglycerides and blood glucose Increase intake non starchy vegetables Restart physical therapy for balance and strength - Dr [name] has already sent order to [place] blood glucose goals  Fasting blood glucose goal (before meals) = 80 to 130 Blood glucose goal after a meal = less than 180    As always if you have any questions or concerns especially regarding medications, please feel free to contact me either at the phone number below or with a MyChart message.   [name], PharmD Clinical Pharmacist [facility] [phone] (direct line)  [phone] (main office number)

## 2022-08-14 ENCOUNTER — Telehealth: Payer: Self-pay | Admitting: Pharmacist

## 2022-08-14 MED ORDER — ONETOUCH VERIO FLEX SYSTEM W/DEVICE KIT: PACK | 0 refills | Status: DC

## 2022-08-14 NOTE — Telephone Encounter (Signed)
Patient has not been able to find the One Touch Verio Flex gluocmeter [facility] disp[facility] 06/03/2022. Insurance will not cover another glucometer so soon.  I have a coupon to get a One Touch Verio for free. Mailing patient coupon and Rx for new glucometer.

## 2022-09-12 ENCOUNTER — Ambulatory Visit: Payer: PPO | Admitting: Internal Medicine

## 2022-09-13 ENCOUNTER — Encounter: Payer: Self-pay | Admitting: Internal Medicine

## 2022-09-13 ENCOUNTER — Ambulatory Visit (INDEPENDENT_AMBULATORY_CARE_PROVIDER_SITE_OTHER): Payer: HMO | Admitting: Internal Medicine

## 2022-09-13 VITALS — BP 126/70 | HR 68 | Temp 97.8°F | Resp 18 | Ht 72.0 in | Wt 303.1 lb

## 2022-09-13 DIAGNOSIS — I152 Hypertension secondary to endocrine disorders: Secondary | ICD-10-CM | POA: Diagnosis not present

## 2022-09-13 DIAGNOSIS — B351 Tinea unguium: Secondary | ICD-10-CM

## 2022-09-13 DIAGNOSIS — M79674 Pain in right toe(s): Secondary | ICD-10-CM

## 2022-09-13 DIAGNOSIS — Z23 Encounter for immunization: Secondary | ICD-10-CM

## 2022-09-13 DIAGNOSIS — E1169 Type 2 diabetes mellitus with other specified complication: Secondary | ICD-10-CM | POA: Diagnosis not present

## 2022-09-13 DIAGNOSIS — M79675 Pain in left toe(s): Secondary | ICD-10-CM

## 2022-09-13 DIAGNOSIS — E1159 Type 2 diabetes mellitus with other circulatory complications: Secondary | ICD-10-CM

## 2022-09-13 DIAGNOSIS — M1A9XX0 Chronic gout, unspecified, without tophus (tophi): Secondary | ICD-10-CM

## 2022-09-13 NOTE — Patient Instructions (Addendum)
Vaccines I recommend: Covid booster RSV vaccine  Check the  blood pressure regularly BP GOAL is between 110/65 and  135/85. If it is consistently higher or lower, let me know     GO TO THE LAB : Get the blood work     [name] [name], [place] back for   a physical exam by February 2024

## 2022-09-13 NOTE — Progress Notes (Unsigned)
Subjective:    Patient ID: [name] [name], male    DOB: [birth year], 81 y.o.   MRN: [id number]  DOS:  09/13/2022 Type of visit - description: Follow-up Routine follow-up. In general feeling well. Good med compliance.  Wt Readings from Last 3 Encounters:  09/13/22 (!) 303 lb 2 oz (137.5 kg)  05/09/22 (!) 304 lb 4 oz (138 kg)  04/12/22 (!) 308 lb 10.3 oz (140 kg)     Review of Systems See above   Past Medical History:  Diagnosis Date   Arthritis    Complication of anesthesia     had spinal with knee surgeries  -"heart rate too low" for general   Detached retina, left    L eye normal vision, reports that he has had a retina procedure in a doctor's office at [facility]    Diabetes mellitus without complication ([place])    Dysrhythmia    slight irregular rate   GERD (gastroesophageal reflux disease)    hx no problems now   H/O echocardiogram 2014   History of hiatal hernia    ?20 yrs ago   Hyperopia 2016   Hypertension    Lumbar stenosis with neurogenic claudication    MG, ocular (myasthenia gravis) ([place])    ? of L eye,    Prediabetes    Presbyopia OU 2016    Past Surgical History:  Procedure Laterality Date   COLONOSCOPY     HERNIA REPAIR     bilateral inguinal hernia   HERNIA REPAIR     umbilical   IR IMAGING GUIDED PORT INSERTION  09/16/2019   IR REMOVAL TUN ACCESS W/ PORT W/O FL MOD SED  03/04/2020   JOINT REPLACEMENT  2009   right knee   LUMBAR LAMINECTOMY/DECOMPRESSION MICRODISCECTOMY N/A 06/06/2017   Procedure: Decompression L3-5, insitu fusion L3-5 ;  Surgeon: Melina Schools, MD;  Location: [place];  Service: Orthopedics;  Laterality: N/A;   LYMPH NODE BIOPSY Right 09/01/2019   Procedure: CERVICAL LYMPH NODE BIOPSY;  Surgeon: [name], MD;  Location: [place];  Service: ENT;  Laterality: Right;   MIDDLE EAR SURGERY Right    ear -patched hole in ear drum   RIGHT/LEFT HEART CATH AND CORONARY ANGIOGRAPHY N/A 11/11/2019   Procedure: RIGHT/LEFT HEART CATH AND CORONARY  ANGIOGRAPHY;  Surgeon: Jolaine Artist, MD;  Location: [facility];  Service: Cardiovascular;  Laterality: N/A;   TOTAL KNEE ARTHROPLASTY  09/10/2012   Procedure: TOTAL KNEE ARTHROPLASTY;  Surgeon: Tobi Bastos, MD;  Location: [facility];  Service: Orthopedics;  Laterality: Left;   TOTAL SHOULDER ARTHROPLASTY Right 06/22/2016   Procedure: RIGHT TOTAL SHOULDER ARTHROPLASTY;  Surgeon: [name], MD;  Location: [facility];  Service: Orthopedics;  Laterality: Right;    Current Outpatient Medications  Medication Instructions   allopurinol (ZYLOPRIM) 300 MG tablet TAKE 1 TABLET BY MOUTH EVERY DAY   aspirin 81 mg, Oral, Daily   Blood Glucose Monitoring Suppl (ONETOUCH VERIO FLEX SYSTEM) w/Device KIT Use to check blood glucose once daily (DX: E11.9  type 2 diabetes)   carvedilol (COREG) 12.5 MG tablet TAKE 1 TABLET BY MOUTH TWICE A DAY   diphenhydramine-acetaminophen (TYLENOL PM) 25-500 MG TABS tablet 1 tablet, Oral, At bedtime PRN   Fish Oil 2,000 mg, Oral, Daily   furosemide (LASIX) 40 mg, Oral, Daily PRN   glucose blood (ONETOUCH VERIO) test strip Use to check blood glucose once a day (Dx: 11.9 - type 2 DM)   KLOR-CON M10 10 MEQ  tablet 10 mEq, Oral, Daily   Lancets (ONETOUCH DELICA PLUS [id number]) MISC Use to check blood glucose once a day (Dx: 11.9 type 2 DM   losartan (COZAAR) 100 MG tablet TAKE 1 TABLET BY MOUTH EVERY DAY   meloxicam (MOBIC) 7.5 mg, Oral, Daily   metFORMIN (GLUCOPHAGE) 500 MG tablet TAKE 1 TABLET BY MOUTH EVERY DAY WITH BREAKFAST   rosuvastatin (CRESTOR) 10 MG tablet TAKE 1 TABLET BY MOUTH EVERY DAY   spironolactone (ALDACTONE) 12.5 mg, Oral, Daily   terbinafine (LAMISIL) 250 mg, Oral, Daily       Objective:   Physical Exam BP 126/70   Pulse 68   Temp 97.8 F (36.6 C) (Oral)   Resp 18   Ht 6' (1.829 m)   Wt (!) 303 lb 2 oz (137.5 kg)   SpO2 97%   BMI 41.11 kg/m  General:   Well developed, NAD, BMI noted. HEENT:  Normocephalic . Face symmetric,  atraumatic Lungs:  CTA B Normal respiratory effort, no intercostal retractions, no accessory muscle use. Heart: RRR,  no murmur.  Lower extremities: Trace pretibial edema bilaterally  Skin: Not pale. Not jaundice Neurologic:  alert & oriented X3.  Speech normal, gait appropriate for age and unassisted Psych--  Cognition and judgment appear intact.  Cooperative with normal attention span and concentration.  Behavior appropriate. No anxious or depressed appearing.      Assessment    Assessment DM + Neuropathy Dx 01-2021 HTN Hyperlipidemia Gout  Morbid obesity Snoring  CV: -Palpitations, PVCs, h/o bradycardia,s/p. Dr Adin Hector day monitor, echo 2014: Normal LV, some LVH  - New onset acute syst CHF 10/2019>>  S/p catheterization 11/11/2019: No CAD: Normal EF. 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  Check CMP Onychomycosis: On Lamisil prescribed elsewhere, checking LFTs. Gout: On allopurinol, no recent attacks. Preventive care: Flu shot today, recommend COVID booster. RTC 3 months CPX  DM: Well-controlled, last A1c 6.7.  Diet has not been good in the last few months but he is trying to correct that.  Check A1c, continue metformin. HTN: On carvedilol, Lasix, losartan, potassium, Aldactone.  Ambulatory BPs all below 140/90.  No change.  Recent BMP okay. Abnormal CT abdomen: Went to the ER after a mechanical fall, he is recuperated from that but the work-up included an abdominal CT that showed left renal cyst (was not recommended a follow-up imaging), also aortic sclerosis (we will try to get LDL to less than 70) High cholesterol: On Crestor 10 mg, recheck labs, see above, will try to get LDL around  70. Stress: Brother passed away recently, brother-in-law is on life support.  Condolences and listening therapy provided, knows to call if needed. Preventive care: Vaccines recommended, see AVS RTC 4 to 5 months

## 2022-09-14 DIAGNOSIS — M109 Gout, unspecified: Secondary | ICD-10-CM | POA: Insufficient documentation

## 2022-09-14 LAB — COMPREHENSIVE METABOLIC PANEL
AG Ratio: 2 (calc) (ref 1.0–2.5)
ALT: 17 U/L (ref 9–46)
AST: 15 U/L (ref 10–35)
Albumin: 4.5 g/dL (ref 3.6–5.1)
Alkaline phosphatase (APISO): 85 U/L (ref 35–144)
BUN/Creatinine Ratio: 15 (calc) (ref 6–22)
BUN: 18 mg/dL (ref 7–25)
CO2: 23 mmol/L (ref 20–32)
Calcium: 9.1 mg/dL (ref 8.6–10.3)
Chloride: 102 mmol/L (ref 98–110)
Creat: 1.24 mg/dL — ABNORMAL HIGH (ref 0.70–1.22)
Globulin: 2.3 g/dL (calc) (ref 1.9–3.7)
Glucose, Bld: 105 mg/dL — ABNORMAL HIGH (ref 65–99)
Potassium: 4.4 mmol/L (ref 3.5–5.3)
Sodium: 138 mmol/L (ref 135–146)
Total Bilirubin: 0.4 mg/dL (ref 0.2–1.2)
Total Protein: 6.8 g/dL (ref 6.1–8.1)

## 2022-09-14 LAB — HEMOGLOBIN A1C
Hgb A1c MFr Bld: 6.5 % of total Hgb — ABNORMAL HIGH (ref <5.7)
Mean Plasma Glucose: 140 mg/dL
eAG (mmol/L): 7.7 mmol/L

## 2022-09-14 NOTE — Assessment & Plan Note (Signed)
DM: Last A1c satisfactory, continue metformin, check a A1c HTN: Reports normal ambulatory BPs, BP today is very good, continue carvedilol, Lasix, potassium, losartan, Aldactone.  Check CMP Onychomycosis: On Lamisil prescribed elsewhere, checking LFTs. Gout: On allopurinol, no recent attacks. Preventive care: Flu shot today, recommend COVID booster. RTC 3 months CPX

## 2022-09-22 ENCOUNTER — Encounter: Payer: Self-pay | Admitting: Internal Medicine

## 2022-10-04 ENCOUNTER — Other Ambulatory Visit: Payer: HMO | Admitting: Licensed Clinical Social Worker

## 2022-10-04 ENCOUNTER — Encounter: Payer: Self-pay | Admitting: Hematology & Oncology

## 2022-10-04 ENCOUNTER — Inpatient Hospital Stay: Payer: HMO | Admitting: Hematology & Oncology

## 2022-10-04 ENCOUNTER — Inpatient Hospital Stay: Payer: HMO | Attending: Hematology & Oncology

## 2022-10-04 ENCOUNTER — Other Ambulatory Visit: Payer: Self-pay

## 2022-10-04 ENCOUNTER — Inpatient Hospital Stay: Payer: HMO | Admitting: Licensed Clinical Social Worker

## 2022-10-04 VITALS — BP 136/63 | HR 57 | Temp 98.2°F | Resp 18 | Ht 72.0 in | Wt 305.0 lb

## 2022-10-04 DIAGNOSIS — C865 Angioimmunoblastic T-cell lymphoma: Secondary | ICD-10-CM | POA: Diagnosis present

## 2022-10-04 DIAGNOSIS — C8440 Peripheral T-cell lymphoma, not classified, unspecified site: Secondary | ICD-10-CM

## 2022-10-04 DIAGNOSIS — Z9221 Personal history of antineoplastic chemotherapy: Secondary | ICD-10-CM | POA: Insufficient documentation

## 2022-10-04 DIAGNOSIS — Z79899 Other long term (current) drug therapy: Secondary | ICD-10-CM | POA: Insufficient documentation

## 2022-10-04 DIAGNOSIS — E1140 Type 2 diabetes mellitus with diabetic neuropathy, unspecified: Secondary | ICD-10-CM | POA: Insufficient documentation

## 2022-10-04 LAB — CMP (CANCER CENTER ONLY)
ALT: 17 U/L (ref 0–44)
AST: 15 U/L (ref 15–41)
Albumin: 4.8 g/dL (ref 3.5–5.0)
Alkaline Phosphatase: 78 U/L (ref 38–126)
Anion gap: 10 (ref 5–15)
BUN: 20 mg/dL (ref 8–23)
CO2: 26 mmol/L (ref 22–32)
Calcium: 9.6 mg/dL (ref 8.9–10.3)
Chloride: 102 mmol/L (ref 98–111)
Creatinine: 1.13 mg/dL (ref 0.61–1.24)
GFR, Estimated: >60 mL/min (ref >60)
Glucose, Bld: 128 mg/dL — ABNORMAL HIGH (ref 70–99)
Potassium: 4.5 mmol/L (ref 3.5–5.1)
Sodium: 138 mmol/L (ref 135–145)
Total Bilirubin: 0.5 mg/dL (ref 0.3–1.2)
Total Protein: 7.1 g/dL (ref 6.5–8.1)

## 2022-10-04 LAB — CBC WITH DIFFERENTIAL (CANCER CENTER ONLY)
Abs Immature Granulocytes: 0.05 10*3/uL (ref 0.00–0.07)
Basophils Absolute: 0.1 10*3/uL (ref 0.0–0.1)
Basophils Relative: 1 %
Eosinophils Absolute: 0.2 10*3/uL (ref 0.0–0.5)
Eosinophils Relative: 3 %
HCT: 39.6 % (ref 39.0–52.0)
Hemoglobin: 13.4 g/dL (ref 13.0–17.0)
Immature Granulocytes: 1 %
Lymphocytes Relative: 18 %
Lymphs Abs: 1.3 10*3/uL (ref 0.7–4.0)
MCH: 31.5 pg (ref 26.0–34.0)
MCHC: 33.8 g/dL (ref 30.0–36.0)
MCV: 93 fL (ref 80.0–100.0)
Monocytes Absolute: 0.7 10*3/uL (ref 0.1–1.0)
Monocytes Relative: 10 %
Neutro Abs: 4.9 10*3/uL (ref 1.7–7.7)
Neutrophils Relative %: 67 %
Platelet Count: 245 10*3/uL (ref 150–400)
RBC: 4.26 MIL/uL (ref 4.22–5.81)
RDW: 13 % (ref 11.5–15.5)
WBC Count: 7.2 10*3/uL (ref 4.0–10.5)
nRBC: 0 % (ref 0.0–0.2)

## 2022-10-04 LAB — LACTATE DEHYDROGENASE: LDH: 135 U/L (ref 98–192)

## 2022-10-04 NOTE — Progress Notes (Signed)
This man Hematology and Oncology Follow Up Visit  [name] [name] [id number] 06-Dec-1940 81 y.o. 10/04/2022   Principle Diagnosis:  Stage IV angioimmunoblastic T-cell NHL  Current Therapy:   S/p CHP + Adcetris x 6 cycles -- completed on 01/19/2020     Interim History:  [name] [name] is back for follow-up.  He still trying to deal with his wife.  She is in a nursing home.  She has bad dementia.  Some days she does not know who he is.  It seems like more family members are passing away.  I think her brother-in-law passed away.  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  He is quite busy doing this I think this helps him out.  He has had no problems with fever.  He has had no change in bowel or bladder habits.  He has had no bleeding.  His weight continues to go up.  He has had no rashes.  He has had no headache.  He does have diabetes.  He is trying to watch this.  He does have some neuropathy.  Overall, I would have said that his performance status is probably ECOG 1.      Medications:  Current Outpatient Medications:    allopurinol (ZYLOPRIM) 300 MG tablet, TAKE 1 TABLET BY MOUTH EVERY DAY, Disp: 90 tablet, Rfl: 1   aspirin 81 MG chewable tablet, Chew 81 mg by mouth daily., Disp: , Rfl:    Blood Glucose Monitoring Suppl (ONETOUCH VERIO FLEX SYSTEM) w/Device KIT, Use to check blood glucose once daily (DX: E11.9  type 2 diabetes), Disp: 1 kit, Rfl: 0   carvedilol (COREG) 12.5 MG tablet, TAKE 1 TABLET BY MOUTH TWICE A DAY (Patient taking differently: Take 12.5 mg by mouth 2 (two) times daily with a meal.), Disp: 180 tablet, Rfl: 1   diphenhydramine-acetaminophen (TYLENOL PM) 25-500 MG TABS tablet, Take 1 tablet by mouth at bedtime as needed., Disp: , Rfl:    furosemide (LASIX) 40 MG tablet, TAKE 1 TABLET (40 MG TOTAL) BY MOUTH DAILY AS NEEDED FOR FLUID OR EDEMA., Disp: 90 tablet, Rfl: 1   glucose blood (ONETOUCH VERIO) test strip, Use to check blood glucose once a day  (Dx: 11.9 - type 2 DM), Disp: 100 each, Rfl: 3   KLOR-CON M10 10 MEQ tablet, TAKE 1 TABLET BY MOUTH EVERY DAY, Disp: 90 tablet, Rfl: 1   Lancets (ONETOUCH DELICA PLUS [id number]) MISC, Use to check blood glucose once a day (Dx: 11.9 type 2 DM, Disp: 100 each, Rfl: 3   losartan (COZAAR) 100 MG tablet, TAKE 1 TABLET BY MOUTH EVERY DAY, Disp: 90 tablet, Rfl: 1   meloxicam (MOBIC) 7.5 MG tablet, Take 7.5 mg by mouth daily., Disp: , Rfl:    metFORMIN (GLUCOPHAGE) 500 MG tablet, TAKE 1 TABLET BY MOUTH EVERY DAY WITH BREAKFAST, Disp: 90 tablet, Rfl: 1   Omega-3 Fatty Acids (FISH OIL) 1000 MG CAPS, Take 2,000 mg by mouth daily., Disp: , Rfl:    rosuvastatin (CRESTOR) 10 MG tablet, TAKE 1 TABLET BY MOUTH EVERY DAY (Patient taking differently: Take 10 mg by mouth daily.), Disp: 90 tablet, Rfl: 3   simvastatin (ZOCOR) 80 MG tablet, Take 0.5 tablets by mouth daily., Disp: , Rfl:    spironolactone (ALDACTONE) 25 MG tablet, Take 0.5 tablets (12.5 mg total) by mouth daily., Disp: 90 tablet, Rfl: 3   terbinafine (LAMISIL) 250 MG tablet, Take 250 mg by mouth daily., Disp: , Rfl:  Allergies:  Allergies  Allergen Reactions   Doxycycline Rash    Past Medical History, Surgical history, Social history, and Family History were reviewed and updated.  Review of Systems: Review of Systems  Constitutional: Negative.   HENT:  Negative.    Eyes: Negative.   Respiratory:  Positive for shortness of breath.   Cardiovascular:  Positive for leg swelling.  Gastrointestinal:  Positive for nausea.  Endocrine: Negative.   Genitourinary: Negative.    Musculoskeletal:  Positive for arthralgias.  Skin: Negative.   Neurological: Negative.   Hematological: Negative.   Psychiatric/Behavioral: Negative.      Physical Exam:  height is 6' (1.829 m) and weight is 305 lb (138.3 kg) (abnormal). His oral temperature is 98.2 F (36.8 C). His blood pressure is 136/63 and his pulse is 57 (abnormal). His respiration is 18 and  oxygen saturation is 99%.   Wt Readings from Last 3 Encounters:  10/04/22 (!) 305 lb (138.3 kg)  09/13/22 (!) 303 lb 2 oz (137.5 kg)  05/09/22 (!) 304 lb 4 oz (138 kg)    Physical Exam Vitals reviewed.  HENT:     Head: Normocephalic and atraumatic.  Eyes:     Pupils: Pupils are equal, round, and reactive to light.  Cardiovascular:     Rate and Rhythm: Normal rate and regular rhythm.     Heart sounds: Normal heart sounds.  Pulmonary:     Effort: Pulmonary effort is normal.     Breath sounds: Normal breath sounds.  Abdominal:     General: Bowel sounds are normal.     Palpations: Abdomen is soft.  Musculoskeletal:        General: No tenderness or deformity. Normal range of motion.     Cervical back: Normal range of motion.  Lymphadenopathy:     Cervical: No cervical adenopathy.  Skin:    General: Skin is warm and dry.     Findings: No erythema or rash.  Neurological:     Mental Status: He is alert and oriented to person, place, and time.  Psychiatric:        Behavior: Behavior normal.        Thought Content: Thought content normal.        Judgment: Judgment normal.      Lab Results  Component Value Date   WBC 7.2 10/04/2022   HGB 13.4 10/04/2022   HCT 39.6 10/04/2022   MCV 93.0 10/04/2022   PLT 245 10/04/2022     Chemistry      Component Value Date/Time   NA 138 10/04/2022 0900   K 4.5 10/04/2022 0900   CL 102 10/04/2022 0900   CO2 26 10/04/2022 0900   BUN 20 10/04/2022 0900   CREATININE 1.13 10/04/2022 0900   CREATININE 1.24 (H) 09/13/2022 1537      Component Value Date/Time   CALCIUM 9.6 10/04/2022 0900   ALKPHOS 78 10/04/2022 0900   AST 15 10/04/2022 0900   ALT 17 10/04/2022 0900   BILITOT 0.5 10/04/2022 0900      Impression and Plan: [name] [name] is a very nice 81 year old white male.  He had stage IV T-cell lymphoma.  This was an angioimmunoblastic lymphoma.  He was treated with chemotherapy and targeted therapy.  He completed 6 cycles of treatment  back on 01/19/2020.  He went into remission.  He is still in remission.  So far, everything looks fantastic.  I just wish that the weight would not be a problem for him.  I  think the weight will clearly dictate his prognosis.  I do not see need for any CAT scans on him.  Will continue to pray for his poor wife.  I will plan to see him back in 6 more months.   [name], MD 12/13/20239:48 AM

## 2022-10-04 NOTE — Progress Notes (Signed)
[facility] [place] Work  Clinical Social Work was referred by nurse for assessment of psychosocial needs.  Clinical Social Worker met with patient to offer support and assess for needs.    Patient expressed grief over several losses he has experienced lately.  Also, his wife has dementia and is in a nursing home.  She has been at [facility] facility for about two and a half years.  Patient said he is feeling increased emotions this year and wasn't sure why.  Explored ways he has coped in the past.  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  Discussed writing a song to help process the grief.  He appeared to enjoy participating in life review.   Margaree Mackintosh, LCSW  Clinical Social Worker [facility]

## 2022-10-11 ENCOUNTER — Encounter: Payer: Self-pay | Admitting: Podiatry

## 2022-10-11 ENCOUNTER — Ambulatory Visit: Payer: HMO | Admitting: Podiatry

## 2022-10-11 DIAGNOSIS — M79675 Pain in left toe(s): Secondary | ICD-10-CM

## 2022-10-11 DIAGNOSIS — E1169 Type 2 diabetes mellitus with other specified complication: Secondary | ICD-10-CM

## 2022-10-11 DIAGNOSIS — L608 Other nail disorders: Secondary | ICD-10-CM | POA: Diagnosis not present

## 2022-10-11 DIAGNOSIS — B351 Tinea unguium: Secondary | ICD-10-CM | POA: Diagnosis not present

## 2022-10-11 DIAGNOSIS — M79674 Pain in right toe(s): Secondary | ICD-10-CM

## 2022-10-11 NOTE — Progress Notes (Signed)
This patient returns to my office for at risk foot care.  This patient requires this care by a professional since this patient will be at risk due to having diabetes type 2. This patient is having problems with his left knee and hip.  This patient is unable to cut nails himself since the patient cannot reach his nails.These nails are painful walking and wearing shoes.  This patient presents for at risk foot care today.  General Appearance  Alert, conversant and in no acute stress.  Vascular  Dorsalis pedis  are palpable  bilaterally. Posterior tibial pulses are absent  B/L. Capillary return is within normal limits  bilaterally. Cold feet  Bilaterally. Absent hair  B/L.  Neurologic  Senn-Weinstein monofilament wire test within normal limits/diminished   bilaterally. Muscle power within normal limits bilaterally.  Nails Thick disfigured discolored nails with subungual debris  Hallux nails. No evidence of bacterial infection or drainage bilaterally.  Orthopedic  No limitations of motion  feet .  No crepitus or effusions noted.  No bony pathology or digital deformities noted.  HAV  B/L.  Skin  normotropic skin with no porokeratosis noted bilaterally.  No signs of infections or ulcers noted.     Onychomycosis  Pain in right toes  Pain in left toes  Consent was obtained for treatment procedures.   Mechanical debridement of nails 1-5  bilaterally performed with a nail nipper.  Filed with dremel without incident.    Return office visit     3 months.                Told patient to return for periodic foot care and evaluation due to potential at risk complications.   Gardiner Barefoot DPM

## 2022-10-12 ENCOUNTER — Telehealth: Payer: Self-pay | Admitting: Pharmacist

## 2022-10-12 ENCOUNTER — Telehealth: Payer: HMO

## 2022-10-12 NOTE — Telephone Encounter (Signed)
  Care Management   Follow Up Note   10/12/2022 Name: [name] [name] MRN: [id number] DOB: 01-03-1941   Referred by: Colon Branch, MD Reason for referral : No chief complaint on file.  An unsuccessful telephone outreach was attempted today. Patient was referred to Clinical Pharmacist Practitioner for medication management by PCP. Today's phone visit was for medication management follow up   Follow Up Plan: The care management team will reach out to the patient again over the next 30 days.   [name], PharmD Clinical Pharmacist [facility]

## 2022-10-18 ENCOUNTER — Telehealth: Payer: Self-pay

## 2022-10-18 NOTE — Progress Notes (Signed)
  Care Coordination Note  10/18/2022 Name: [name] MRN: [id number] DOB: [birth year]  [name] [name] is a 81 y.o. year old male who is a primary care patient of Colon Branch, MD and is actively engaged with the care management team. I reached out to [name] [name] by phone today to assist with re-scheduling a follow up visit with the Pharmacist  Follow up plan: Unsuccessful telephone outreach attempt made. A HIPAA compliant phone message was left for the patient providing contact information and requesting a return call.  The care management team will reach out to the patient again over the next 7 days.  If patient returns call to provider office, please advise to call [facility]  at [place], [place], [place] [address] Direct Dial: [phone] [email or web address]

## 2022-10-25 ENCOUNTER — Other Ambulatory Visit: Payer: Self-pay | Admitting: Internal Medicine

## 2022-10-25 ENCOUNTER — Other Ambulatory Visit (HOSPITAL_COMMUNITY): Payer: Self-pay | Admitting: Internal Medicine

## 2022-10-25 DIAGNOSIS — B351 Tinea unguium: Secondary | ICD-10-CM | POA: Diagnosis not present

## 2022-11-07 NOTE — Progress Notes (Signed)
  Care Coordination Note  11/07/2022 Name: LIVAN HIRES MRN: [id number] DOB: [birth year]  Madaline Savage is a 82 y.o. year old male who is a primary care patient of Colon Branch, MD and is actively engaged with the care management team. I reached out to Madaline Savage by phone today to assist with re-scheduling a follow up visit with the Pharmacist  Follow up plan: Telephone appointment with care management team member scheduled for:11/08/2022  Noreene Larsson, [facility]  [place], [place] [address] Direct Dial: [phone] [email or web address]

## 2022-11-08 ENCOUNTER — Encounter: Payer: Self-pay | Admitting: Hematology

## 2022-11-08 ENCOUNTER — Ambulatory Visit: Payer: PPO | Admitting: Pharmacist

## 2022-11-08 DIAGNOSIS — I152 Hypertension secondary to endocrine disorders: Secondary | ICD-10-CM

## 2022-11-08 DIAGNOSIS — E1169 Type 2 diabetes mellitus with other specified complication: Secondary | ICD-10-CM

## 2022-11-08 NOTE — Progress Notes (Signed)
Pharmacy Note  11/08/2022 Name: [name] [name] MRN: [id number] DOB: 11/29/1940  Subjective: [name] [name] is a 82 y.o. year old male who is a primary care patient of Colon Branch, MD. Clinical Pharmacist Practitioner referral was placed to assist with medication and diabetes management and medication adherence  Engaged with patient by telephone for follow up visit today.  Hypertension:  Last office blood pressure was good. Patient is taking losartan and carvedilol. Denies dizziness.  He is checking blood pressure occasionally at home but did not have any readings written down to report.  Type 2 diabetes: last A1c at goal. Currently taking metformin '500mg'$  daily  Prescribed One Touch Verio glucometer to check blood glucose at home but today patient states he has been afraid to try to use it.   Hyperlipidemia: LDL at goal but triglycerides elevated - possibly related to dietary indiscretion. Patient has been limiting sweets and high carbohydrate foods more over the last 4 months.  Medication adherence - Epic report shows that last RF for losartan, metformin, carvedilol and rosuvastatin was 07/25/2022 however patient states that he has plenty on hand. He endorses that he is taking all these meds as directed with the exception of carvedilol. Reports he sometimes misses evening dose of carvedilol.  [facility] -  losartan last RF was 10/27/22 for 90 days Metformin last RF was 10/27/22 for 90 days Rosuvastatin last RF was 10/27/22 for 90 days and  last carvedilol RF was 11/01/22 for 90 days.   Objective: Review of patient status, including review of consultants reports, laboratory and other test data, was performed as part of comprehensive evaluation and provision of chronic care management services.   Lab Results  Component Value Date   CREATININE 1.13 10/04/2022   CREATININE 1.24 (H) 09/13/2022   CREATININE 1.20 04/12/2022    Lab Results  Component Value Date   HGBA1C 6.5 (H) 09/13/2022        Component Value Date/Time   CHOL 141 05/09/2022 0916   TRIG 348.0 (H) 05/09/2022 0916   TRIG 247 (HH) 08/30/2006 1117   HDL 35.30 (L) 05/09/2022 0916   CHOLHDL 4 05/09/2022 0916   VLDL 69.6 (H) 05/09/2022 0916   LDLCALC 39 10/14/2019 0526   LDLDIRECT 69.0 05/09/2022 0916     Clinical ASCVD: Yes  The ASCVD Risk score (Arnett DK, et al., 2019) failed to calculate for the following reasons:   The 2019 ASCVD risk score is only valid for ages 35 to 4   The patient has a prior MI or stroke diagnosis    BP Readings from Last 3 Encounters:  10/04/22 136/63  09/13/22 126/70  05/09/22 134/84     Allergies  Allergen Reactions   Doxycycline Rash    Medications Reviewed Today     Reviewed by [name], RPH-CPP (Pharmacist) on 11/08/22 at [place] List Status: <None>   Medication Order Taking? Sig Documenting Provider Last Dose Status Informant  allopurinol (ZYLOPRIM) 300 MG tablet [id number] Yes TAKE 1 TABLET BY MOUTH EVERY DAY [name], MD Taking Active   aspirin 81 MG chewable tablet [id number] Yes Chew 81 mg by mouth daily. [provider] Taking Active Self  Blood Glucose Monitoring Suppl (ONETOUCH VERIO FLEX SYSTEM) w/Device KIT [id number] No Use to check blood glucose once daily (DX: E11.9  type 2 diabetes)  Patient not taking: Reported on 11/08/2022   Colon Branch, MD Not Taking Active   carvedilol (COREG) 12.5 MG tablet [id number] Yes  TAKE 1 TABLET BY MOUTH TWICE A DAY [name], [name], MD Taking Active   diphenhydramine-acetaminophen (TYLENOL PM) 25-500 MG TABS tablet [id number] Yes Take 1 tablet by mouth at bedtime as needed. [provider] Taking Active Self  furosemide (LASIX) 40 MG tablet [id number] Yes TAKE 1 TABLET (40 MG TOTAL) BY MOUTH DAILY AS NEEDED FOR FLUID OR EDEMA. Dutch Quint B, FNP Taking Active   glucose blood (ONETOUCH VERIO) test strip [id number] No Use to check blood glucose once a day (Dx: 11.9 - type 2 DM)  Patient not taking:  Reported on 11/08/2022   Colon Branch, MD Not Taking Active   KLOR-CON M10 10 MEQ tablet [id number] Yes TAKE 1 TABLET BY MOUTH EVERY DAY Dutch Quint B, FNP Taking Active   Lancets (ONETOUCH DELICA PLUS [id number]) [place] [id number] No Use to check blood glucose once a day (Dx: 11.9 type 2 DM  Patient not taking: Reported on 11/08/2022   Colon Branch, MD Not Taking Active   losartan (COZAAR) 100 MG tablet [id number] Yes TAKE 1 TABLET BY MOUTH EVERY DAY Colon Branch, MD Taking Active   meloxicam (MOBIC) 7.5 MG tablet [id number] Yes Take 7.5 mg by mouth daily. [provider] Taking Active Self           Med Note Dema Severin, Roxy Cedar Apr 12, 2022 11:54 PM)    metFORMIN (GLUCOPHAGE) 500 MG tablet [id number] Yes TAKE 1 TABLET BY MOUTH EVERY DAY WITH BREAKFAST Colon Branch, MD Taking Active   Omega-3 Fatty Acids (FISH OIL) 1000 MG CAPS [id number] Yes Take 2,000 mg by mouth daily. [provider] Taking Active Self           Med Note (HILL, TIFFANY A   Fri May 28, 2020 10:20 AM)    rosuvastatin (CRESTOR) 10 MG tablet [id number] Yes TAKE 1 TABLET BY MOUTH EVERY DAY Bensimhon, [name], MD Taking Active   spironolactone (ALDACTONE) 25 MG tablet [id number] Yes Take 0.5 tablets (12.5 mg total) by mouth daily. Bensimhon, [name], MD Taking Active Self           Med Note ([place] Feb 07, 2022  2:06 PM)    terbinafine (LAMISIL) 250 MG tablet [id number] Yes Take 250 mg by mouth daily. 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   Stasis dermatitis of both legs 06/21/2019   Diabetes mellitus, type II ([place]) 07/09/2017   Status post lumbar surgery 06/06/2017   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  Patient affirms current coverage meets needs.   Assessment:   Type 2 DM - controled Hyperlipidemia - LDL at goal of < 70 but Tg not at goal of < 150.  Hypertension - at goal Medication Management - adherence improved over the last year but patient sometimes missing evening dose of carvedilol  Plan: Set up face to face visit 11/15/22 to glucometer education. Continue to take metformin '500mg'$  daily Continue to limit intake of high CHO foods and sugar.  [facility] for carvedilol, rosuvastatin, metofmrin and losartan.  Discussed way to improve adherence to evening meds:  Place bottle by toothbrush Purchase an weekly reminder container to fill with evening meds and place by toothbrush. Patient will come to office next week and will provide weekly pill container and instruction on filling if he has not gotten already.    Recommended continue rosuvastatin and increase fish oil to '2000mg'$  a day. Reviewed dietary ways to lower triglycerides. Continue to limit sweets and high carbohydrate foods.     Follow Up:  Face to Face appointment with care management team member scheduled for: Nov 15, 2022 at 2pm  for glucometer education   [name], PharmD Clinical Pharmacist [place] [phone]

## 2022-11-15 ENCOUNTER — Ambulatory Visit (INDEPENDENT_AMBULATORY_CARE_PROVIDER_SITE_OTHER): Payer: PPO | Admitting: Pharmacist

## 2022-11-15 ENCOUNTER — Encounter: Payer: Self-pay | Admitting: Hematology

## 2022-11-15 ENCOUNTER — Other Ambulatory Visit (HOSPITAL_COMMUNITY): Payer: Self-pay

## 2022-11-15 VITALS — BP 138/70 | HR 68 | Wt 309.8 lb

## 2022-11-15 DIAGNOSIS — E1159 Type 2 diabetes mellitus with other circulatory complications: Secondary | ICD-10-CM

## 2022-11-15 DIAGNOSIS — I152 Hypertension secondary to endocrine disorders: Secondary | ICD-10-CM

## 2022-11-15 DIAGNOSIS — I5020 Unspecified systolic (congestive) heart failure: Secondary | ICD-10-CM

## 2022-11-15 DIAGNOSIS — E1169 Type 2 diabetes mellitus with other specified complication: Secondary | ICD-10-CM | POA: Diagnosis not present

## 2022-11-15 NOTE — Progress Notes (Signed)
Pharmacy Note  11/15/2022 Name: [name] [name] MRN: [id number] DOB: 1941/10/03  Subjective: [name] [name] is a 82 y.o. year old male who is a primary care patient of Colon Branch, MD. Clinical Pharmacist Practitioner referral was placed to assist with medication and diabetes management and medication adherence  Engaged with patient by telephone for follow up visit today.  Hypertension:  Current therapy: losartan and carvedilol. Denies dizziness.  He is checking blood pressure occasionally at home but did not have any readings with him today. At last visit we discussed adherence tips to help remember 2nd dose of carvedilol at night (patient was forgetting) Type 2 diabetes: last A1c at goal. Currently taking metformin '500mg'$  daily  Patient brings in OneTouch glucometer for education on how to use     Objective: Review of patient status, including review of consultants reports, laboratory and other test data, was performed as part of comprehensive evaluation and provision of chronic care management services.   Lab Results  Component Value Date   CREATININE 1.13 10/04/2022   CREATININE 1.24 (H) 09/13/2022   CREATININE 1.20 04/12/2022    Lab Results  Component Value Date   HGBA1C 6.5 (H) 09/13/2022       Component Value Date/Time   CHOL 141 05/09/2022 0916   TRIG 348.0 (H) 05/09/2022 0916   TRIG 247 (HH) 08/30/2006 1117   HDL 35.30 (L) 05/09/2022 0916   CHOLHDL 4 05/09/2022 0916   VLDL 69.6 (H) 05/09/2022 0916   LDLCALC 39 10/14/2019 0526   LDLDIRECT 69.0 05/09/2022 0916     Clinical ASCVD: Yes  The ASCVD Risk score (Arnett DK, et al., 2019) failed to calculate for the following reasons:   The 2019 ASCVD risk score is only valid for ages 61 to 25    BP Readings from Last 3 Encounters:  11/15/22 138/70  10/04/22 136/63  09/13/22 126/70     Allergies  Allergen Reactions   Doxycycline Rash    Medications Reviewed Today     Reviewed by [name], RPH-CPP  (Pharmacist) on 11/08/22 at [place] List Status: <None>   Medication Order Taking? Sig Documenting Provider Last Dose Status Informant  allopurinol (ZYLOPRIM) 300 MG tablet [id number] Yes TAKE 1 TABLET BY MOUTH EVERY DAY [name], MD Taking Active   aspirin 81 MG chewable tablet [id number] Yes Chew 81 mg by mouth daily. [provider] Taking Active Self  Blood Glucose Monitoring Suppl (ONETOUCH VERIO FLEX SYSTEM) w/Device KIT [id number] No Use to check blood glucose once daily (DX: E11.9  type 2 diabetes)  Patient not taking: Reported on 11/08/2022   Colon Branch, MD Not Taking Active   carvedilol (COREG) 12.5 MG tablet [id number] Yes TAKE 1 TABLET BY MOUTH TWICE A DAY [name], [name], MD Taking Active   diphenhydramine-acetaminophen (TYLENOL PM) 25-500 MG TABS tablet [id number] Yes Take 1 tablet by mouth at bedtime as needed. [provider] Taking Active Self  furosemide (LASIX) 40 MG tablet [id number] Yes TAKE 1 TABLET (40 MG TOTAL) BY MOUTH DAILY AS NEEDED FOR FLUID OR EDEMA. Dutch Quint B, FNP Taking Active   glucose blood [facility] VERIO) test strip [id number] No Use to check blood glucose once a day (Dx: 11.9 - type 2 DM)  Patient not taking: Reported on 11/08/2022   Colon Branch, MD Not Taking Active   KLOR-CON M10 10 MEQ tablet [id number] Yes TAKE 1 TABLET BY MOUTH EVERY DAY [name], FNP Taking Active  Lancets (ONETOUCH DELICA PLUS [id number]) [place] [id number] No Use to check blood glucose once a day (Dx: 11.9 type 2 DM  Patient not taking: Reported on 11/08/2022   Colon Branch, MD Not Taking Active   losartan (COZAAR) 100 MG tablet [id number] Yes TAKE 1 TABLET BY MOUTH EVERY DAY Colon Branch, MD Taking Active   meloxicam (MOBIC) 7.5 MG tablet [id number] Yes Take 7.5 mg by mouth daily. [provider] Taking Active Self           Med Note Dema Severin, Roxy Cedar Apr 12, 2022 11:54 PM)    metFORMIN (GLUCOPHAGE) 500 MG tablet [id number] Yes TAKE 1 TABLET BY MOUTH  EVERY DAY WITH BREAKFAST Colon Branch, MD Taking Active   Omega-3 Fatty Acids (FISH OIL) 1000 MG CAPS [id number] Yes Take 2,000 mg by mouth daily. [provider] Taking Active Self           Med Note (HILL, TIFFANY A   Fri May 28, 2020 10:20 AM)    rosuvastatin (CRESTOR) 10 MG tablet [id number] Yes TAKE 1 TABLET BY MOUTH EVERY DAY Bensimhon, [name], MD Taking Active   spironolactone (ALDACTONE) 25 MG tablet [id number] Yes Take 0.5 tablets (12.5 mg total) by mouth daily. Bensimhon, [name], MD Taking Active Self           Med Note ([place] Feb 07, 2022  2:06 PM)    terbinafine (LAMISIL) 250 MG tablet [id number] Yes Take 250 mg by mouth daily. 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   Stasis dermatitis of both legs 06/21/2019   Diabetes mellitus, type II ([place]) 07/09/2017   Status post lumbar surgery 06/06/2017   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  Patient affirms current coverage meets needs.   Assessment:   Type 2 DM - controled Hypertension - at goal Medication Management - adherence improved over the last year but patient sometimes missing evening dose of carvedilol  Plan: Glucometer education provided  How to set time and date  Prep of finger for fingerstick and how to get adequate blood sample  How to store testing supplies   Reviewed Blood glucose goals and when to notify office if too low or too high  Fasting blood glucose goal (before meals) = 80 to 130 Blood glucose goal after a meal = less than 180   Continue current medications Continue to check blood pressure 2 to 3 times  per day - discussed goal of < 140/90 Continue to limit intake of high CHO foods and sugar.  Discussed way to improve adherence to evening meds:  Place bottle by toothbrush Provided patient with a weekly reminder container to fill with evening meds and place by toothbrush.    Reviewed medication list, indication for each medication and how to take.  Reviewed refill history and adherence.     Follow Up:  Telephone follow up appointment with care management team member scheduled for:  3 to 4 months   [name], PharmD Clinical Pharmacist [place] [phone]

## 2022-11-15 NOTE — Patient Instructions (Signed)
Mr. Lammert It was a pleasure speaking with you today.  Below is a summary of your health goals and summary of our recent visit.   Blood glucose goals  Fasting blood glucose goal (before meals) = 80 to 130 Blood glucose goal after a meal = less than 180    Contact our office if you get blood glucose readings less than 70 or over 200.   How to Treat a Low Glucose Level:  If you have a low blood glucose less than 70, please eat / drink 15 grams of carbohydrates (4 oz of juice, soda, 4 glucose tablets, or 3-4 pieces of hard candy).  It is best so choose a "quick" source of sugar that does no contain fat (chocolate and peanut butter might take longer to increase your blood glucose) Wait 15 minutes and then recheck your blood glucose. If your blood glucose is still less than 70, eat another 15 grams of carbohydrates.  Wait another 15 minutes and recheck your glucose.  Continue this until your blood glucose is over 70. Once you blood glucose is over 70, eat a snack with protein in it to prevent your blood glucose from dropping again.  Place bottle by toothbrush weekly reminder container to fill with evening meds and place by toothbrush.    As always if you have any questions or concerns especially regarding medications, please feel free to contact me either at the phone number below or with a MyChart message.   Keep up the good work!  [name], PharmD Clinical Pharmacist [place] [phone] (direct line)  [phone] (main office number)   Print copy of patient instructions, educational materials, and care plan provided in person.

## 2022-12-04 DIAGNOSIS — H2513 Age-related nuclear cataract, bilateral: Secondary | ICD-10-CM | POA: Diagnosis not present

## 2022-12-04 DIAGNOSIS — E119 Type 2 diabetes mellitus without complications: Secondary | ICD-10-CM | POA: Diagnosis not present

## 2022-12-04 DIAGNOSIS — H31002 Unspecified chorioretinal scars, left eye: Secondary | ICD-10-CM | POA: Diagnosis not present

## 2022-12-04 LAB — HM DIABETES EYE EXAM

## 2022-12-06 ENCOUNTER — Other Ambulatory Visit (HOSPITAL_COMMUNITY): Payer: Self-pay | Admitting: Internal Medicine
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
## 2022-12-12 ENCOUNTER — Ambulatory Visit (HOSPITAL_COMMUNITY)
Admission: RE | Admit: 2022-12-12 | Discharge: 2022-12-12 | Disposition: A | Discharge status: Home / Self Care | Payer: PPO | Source: Ambulatory Visit | Attending: Family Medicine | Admitting: Family Medicine

## 2022-12-12 ENCOUNTER — Ambulatory Visit (HOSPITAL_BASED_OUTPATIENT_CLINIC_OR_DEPARTMENT_OTHER)
Admission: RE | Admit: 2022-12-12 | Discharge: 2022-12-12 | Disposition: A | Discharge status: Home / Self Care | Payer: PPO | Source: Ambulatory Visit | Attending: *Deleted | Admitting: *Deleted

## 2022-12-12 VITALS — BP 142/78 | HR 62 | Wt 312.0 lb

## 2022-12-12 DIAGNOSIS — Z791 Long term (current) use of non-steroidal anti-inflammatories (NSAID): Secondary | ICD-10-CM | POA: Diagnosis not present

## 2022-12-12 DIAGNOSIS — I2510 Atherosclerotic heart disease of native coronary artery without angina pectoris: Secondary | ICD-10-CM | POA: Diagnosis not present

## 2022-12-12 DIAGNOSIS — I10 Essential (primary) hypertension: Secondary | ICD-10-CM

## 2022-12-12 DIAGNOSIS — I5020 Unspecified systolic (congestive) heart failure: Secondary | ICD-10-CM

## 2022-12-12 DIAGNOSIS — R0602 Shortness of breath: Secondary | ICD-10-CM | POA: Insufficient documentation

## 2022-12-12 DIAGNOSIS — Z7984 Long term (current) use of oral hypoglycemic drugs: Secondary | ICD-10-CM | POA: Diagnosis not present

## 2022-12-12 DIAGNOSIS — Z7982 Long term (current) use of aspirin: Secondary | ICD-10-CM | POA: Diagnosis not present

## 2022-12-12 DIAGNOSIS — Z79899 Other long term (current) drug therapy: Secondary | ICD-10-CM | POA: Diagnosis not present

## 2022-12-12 DIAGNOSIS — I351 Nonrheumatic aortic (valve) insufficiency: Secondary | ICD-10-CM | POA: Insufficient documentation

## 2022-12-12 DIAGNOSIS — I110 Hypertensive heart disease with heart failure: Secondary | ICD-10-CM | POA: Diagnosis not present

## 2022-12-12 DIAGNOSIS — R001 Bradycardia, unspecified: Secondary | ICD-10-CM | POA: Insufficient documentation

## 2022-12-12 DIAGNOSIS — E669 Obesity, unspecified: Secondary | ICD-10-CM | POA: Diagnosis not present

## 2022-12-12 DIAGNOSIS — E785 Hyperlipidemia, unspecified: Secondary | ICD-10-CM | POA: Diagnosis not present

## 2022-12-12 DIAGNOSIS — E119 Type 2 diabetes mellitus without complications: Secondary | ICD-10-CM | POA: Insufficient documentation

## 2022-12-12 LAB — ECHOCARDIOGRAM COMPLETE
Area-P 1/2: 2.76 cm2
S' Lateral: 2 cm

## 2022-12-12 MED ORDER — PERFLUTREN LIPID MICROSPHERE
1.0000 mL | INTRAVENOUS | Status: DC | PRN
  Administered 2022-12-12: 3 mL via INTRAVENOUS

## 2022-12-12 NOTE — Patient Instructions (Addendum)
Thank you for coming in today  EKG was done today  Your physician recommends that you schedule a follow-up appointment in:  9 months with Dr. [name] You will receive a reminder letter in the mail a few months in advance. If you don't receive a letter, please call our office to schedule the follow-up appointment.     Do the following things EVERYDAY: Weigh yourself in the morning before breakfast. Write it down and keep it in a log. Take your medicines as prescribed Eat low salt foods--Limit salt (sodium) to 2000 mg per day.  Stay as active as you can everyday Limit all fluids for the day to less than 2 liters  At the [facility], you and your health needs are our priority. As part of our continuing mission to provide you with exceptional heart care, we have created designated Provider Care Teams. These Care Teams include your primary Cardiologist (physician) and Advanced Practice Providers (APPs- Physician Assistants and Nurse Practitioners) who all work together to provide you with the care you need, when you need it.   You may see any of the following providers on your designated Care Team at your next follow up: Dr Glori Bickers Dr Loralie Champagne Dr. Roxana Hires, NP Lyda Jester, [place] [facility] [place], [place] Forestine Na, NP Audry Riles, PharmD   Please be sure to bring in all your medications bottles to every appointment.    Thank you for choosing [facility]   If you have any questions or concerns before your next appointment please send [place] a message through [place] or call our office at [phone].    TO LEAVE A MESSAGE FOR THE NURSE SELECT OPTION 2, PLEASE LEAVE A MESSAGE INCLUDING: YOUR NAME DATE OF BIRTH CALL BACK NUMBER REASON FOR CALL**this is important as we prioritize the call backs  YOU WILL RECEIVE A CALL BACK THE SAME DAY AS LONG AS YOU CALL BEFORE 4:00 PM

## 2022-12-12 NOTE — Addendum Note (Signed)
Encounter addended by: Rafael Bihari, FNP on: 12/12/2022 4:53 PM  Actions taken: Clinical Note Signed

## 2022-12-13 ENCOUNTER — Other Ambulatory Visit: Payer: Self-pay | Admitting: Family

## 2022-12-27 ENCOUNTER — Encounter: Payer: HMO | Admitting: Internal Medicine

## 2022-12-28 ENCOUNTER — Other Ambulatory Visit: Payer: Self-pay | Admitting: Hematology & Oncology

## 2022-12-28 DIAGNOSIS — C8440 Peripheral T-cell lymphoma, not classified, unspecified site: Secondary | ICD-10-CM

## 2023-01-03 DIAGNOSIS — M25552 Pain in left hip: Secondary | ICD-10-CM | POA: Diagnosis not present

## 2023-01-03 DIAGNOSIS — M25562 Pain in left knee: Secondary | ICD-10-CM | POA: Diagnosis not present

## 2023-01-04 ENCOUNTER — Encounter: Payer: Self-pay | Admitting: Hematology

## 2023-01-08 ENCOUNTER — Encounter: Payer: Self-pay | Admitting: Internal Medicine

## 2023-01-08 ENCOUNTER — Ambulatory Visit (INDEPENDENT_AMBULATORY_CARE_PROVIDER_SITE_OTHER): Payer: PPO | Admitting: Internal Medicine

## 2023-01-08 VITALS — BP 136/76 | HR 64 | Temp 97.6°F | Resp 18 | Ht 72.0 in | Wt 300.5 lb

## 2023-01-08 DIAGNOSIS — G629 Polyneuropathy, unspecified: Secondary | ICD-10-CM

## 2023-01-08 DIAGNOSIS — E785 Hyperlipidemia, unspecified: Secondary | ICD-10-CM

## 2023-01-08 DIAGNOSIS — Z0001 Encounter for general adult medical examination with abnormal findings: Secondary | ICD-10-CM

## 2023-01-08 DIAGNOSIS — M159 Polyosteoarthritis, unspecified: Secondary | ICD-10-CM

## 2023-01-08 DIAGNOSIS — E1169 Type 2 diabetes mellitus with other specified complication: Secondary | ICD-10-CM

## 2023-01-08 DIAGNOSIS — Z0000 Encounter for general adult medical examination without abnormal findings: Secondary | ICD-10-CM

## 2023-01-08 LAB — B12 AND FOLATE PANEL
Folate: 13 ng/mL (ref >5.9)
Vitamin B-12: 180 pg/mL — ABNORMAL LOW (ref 211–911)

## 2023-01-08 LAB — LIPID PANEL
Cholesterol: 163 mg/dL (ref 0–200)
HDL: 37.7 mg/dL — ABNORMAL LOW (ref >39.00)
Total CHOL/HDL Ratio: 4
Triglycerides: 438 mg/dL — ABNORMAL HIGH (ref 0.0–149.0)

## 2023-01-08 LAB — LDL CHOLESTEROL, DIRECT: Direct LDL: 74 mg/dL

## 2023-01-08 LAB — HEMOGLOBIN A1C: Hgb A1c MFr Bld: 6.5 % (ref 4.6–6.5)

## 2023-01-08 NOTE — Assessment & Plan Note (Addendum)
Here for CPX DM: On metformin, check A1c.  Ambulatory CBGs usually between 115 and 135 Neuropathy: Likely from DM, for completeness we will check 123456 and folic acid.  Feet care discussed.  See AVS. High cholesterol: On Crestor, last LFTs normal, check FLP Cardiovascular: Seen at the advanced heart failure clinic 12/12/2022, they noted that transient LV dysfunction possibly due to critical illness. H/O Lymphoma: Saw oncology 10/04/2022, still in remission.  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  Started to change his diet and some weight loss noted.  Encouraged to continue on the same path. Gout: On allopurinol, no recent events. Morbid obesity: Started to lose weight, eating healthier, see above, praised RTC 6 months

## 2023-01-08 NOTE — Assessment & Plan Note (Signed)
-   Td 2019 - pneumonia shot: 2010;  Prevnar 2015. PNM 20: 2023  - s/p  zostavax and shingrix - covid vax booster and RSV rec - Had a flu shot Colon cancer screening: Colonoscopy:  Adenomatous Polyp (12/17/2006) Cscope again 4-13 was told 3 years  Cscope 08-2015: polyps Colonoscopy 08-2018 Cscope 03/2022, next per GI Prostate cancer screening:   no further screening -Labs: Reviewed, will get FLP, 123456, 123456 and folic acid. -Diet and exercise: discussed  -Rec to bring a healthcare power of attorney

## 2023-01-08 NOTE — Progress Notes (Signed)
Subjective:    Patient ID: [name] [name], male    DOB: 21-Oct-1941, 82 y.o.   MRN: [id number]  DOS:  01/08/2023 Type of visit - description: cpx  Here for CPX. No new symptoms. His main concern is left hip pain.  BP Readings from Last 3 Encounters:  01/08/23 136/76  12/12/22 (!) 142/78  11/15/22 138/70   Wt Readings from Last 3 Encounters:  01/08/23 (!) 300 lb 8 oz (136.3 kg)  12/12/22 (!) 312 lb (141.5 kg)  11/15/22 (!) 309 lb 12.8 oz (140.5 kg)    Review of Systems  Other than above, a 14 point review of systems is negative   Past Medical History:  Diagnosis Date   Arthritis    Complication of anesthesia     had spinal with knee surgeries  -"heart rate too low" for general   Detached retina, left    L eye normal vision, reports that he has had a retina procedure in a doctor's office at [facility]    Diabetes mellitus without complication ([place])    Dysrhythmia    slight irregular rate   GERD (gastroesophageal reflux disease)    hx no problems now   H/O echocardiogram 2014   History of hiatal hernia    ?20 yrs ago   Hyperopia 2016   Hypertension    Lumbar stenosis with neurogenic claudication    MG, ocular (myasthenia gravis) ([place])    ? of L eye,    Prediabetes    Presbyopia OU 2016    Past Surgical History:  Procedure Laterality Date   COLONOSCOPY     HERNIA REPAIR     bilateral inguinal hernia   HERNIA REPAIR     umbilical   IR IMAGING GUIDED PORT INSERTION  09/16/2019   IR REMOVAL TUN ACCESS W/ PORT W/O FL MOD SED  03/04/2020   JOINT REPLACEMENT  2009   right knee   LUMBAR LAMINECTOMY/DECOMPRESSION MICRODISCECTOMY N/A 06/06/2017   Procedure: Decompression L3-5, insitu fusion L3-5 ;  Surgeon: Melina Schools, MD;  Location: [facility];  Service: Orthopedics;  Laterality: N/A;   LYMPH NODE BIOPSY Right 09/01/2019   Procedure: CERVICAL LYMPH NODE BIOPSY;  Surgeon: [name], MD;  Location: [facility];  Service: ENT;  Laterality: Right;   MIDDLE EAR SURGERY Right     ear -patched hole in ear drum   RIGHT/LEFT HEART CATH AND CORONARY ANGIOGRAPHY N/A 11/11/2019   Procedure: RIGHT/LEFT HEART CATH AND CORONARY ANGIOGRAPHY;  Surgeon: Jolaine Artist, MD;  Location: [facility];  Service: Cardiovascular;  Laterality: N/A;   TOTAL KNEE ARTHROPLASTY  09/10/2012   Procedure: TOTAL KNEE ARTHROPLASTY;  Surgeon: Tobi Bastos, MD;  Location: [facility];  Service: Orthopedics;  Laterality: Left;   TOTAL SHOULDER ARTHROPLASTY Right 06/22/2016   Procedure: RIGHT TOTAL SHOULDER ARTHROPLASTY;  Surgeon: [name], MD;  Location: [facility];  Service: Orthopedics;  Laterality: Right;   Social History   Socioeconomic History   Marital status: Married    Spouse name: Not on file   Number of children: 4   Years of education: Not on file   Highest education level: Not on file  Occupational History   Occupation: [employer]-- retired 2010  Tobacco Use   Smoking status: Former    Packs/day: 0.50    Years: 2.00    Additional pack years: 0.00    Total pack years: 1.00    Types: Cigarettes    Quit date: 06/09/1966    Years  since quitting: 56.6   Smokeless tobacco: Never  Vaping Use   Vaping Use: Never used  Substance and Sexual Activity   Alcohol use: No    Alcohol/week: 0.0 standard drinks of alcohol    Comment: none   Drug use: No   Sexual activity: Yes    Partners: Female  Other Topics Concern   Not on file  Social History Narrative   married, 2nd wife at a nursing home, memory unit   Lives alone , lives independently     2 living children, lost 2 kids     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   Social Determinants of Health   Financial Resource Strain: Low Risk  (11/08/2022)   Overall Financial Resource Strain (CARDIA)    Difficulty of Paying Living Expenses: Not very hard  Food Insecurity: No Food Insecurity (03/10/2022)   Hunger Vital Sign    Worried About Running Out of Food in the Last Year: Never true    [place] in the  Last Year: Never true  Transportation Needs: No Transportation Needs (03/10/2022)   PRAPARE - Hydrologist (Medical): No    Lack of Transportation (Non-Medical): No  Physical Activity: Insufficiently Active (03/10/2022)   Exercise Vital Sign    Days of Exercise per Week: 7 days    Minutes of Exercise per Session: 20 min  Stress: No Stress Concern Present (03/10/2022)   [place]    Feeling of Stress : Only a little  Social Connections: Socially Integrated (11/08/2022)   Social Connection and Isolation Panel [NHANES]    Frequency of Communication with Friends and Family: Three times a week    Frequency of Social Gatherings with Friends and Family: Twice a week    Attends Religious Services: More than 4 times per year    Active Member of [employer] or Organizations: Yes    Attends Music therapist: More than 4 times per year    Marital Status: Married  Human resources officer Violence: Not At Risk (03/10/2022)   Humiliation, Afraid, Rape, and Kick questionnaire    Fear of Current or Ex-Partner: No    Emotionally Abused: No    Physically Abused: No    Sexually Abused: No    Current Outpatient Medications  Medication Instructions   allopurinol (ZYLOPRIM) 300 MG tablet TAKE 1 TABLET BY MOUTH EVERY DAY   aspirin 81 mg, Oral, Daily   Blood Glucose Monitoring Suppl ([place]) w/Device KIT Use to check blood glucose once daily (DX: E11.9  type 2 diabetes)   carvedilol (COREG) 12.5 MG tablet TAKE 1 TABLET BY MOUTH TWICE A DAY   diphenhydramine-acetaminophen (TYLENOL PM) 25-500 MG TABS tablet 1 tablet, Oral, At bedtime PRN   Fish Oil 2,000 mg, Oral, Daily   furosemide (LASIX) 40 mg, Oral, Daily PRN   glucose blood (ONETOUCH VERIO) test strip Use to check blood glucose once a day (Dx: 11.9 - type 2 DM)   KLOR-CON M10 10 MEQ tablet 10 mEq, Oral, Daily   Lancets (ONETOUCH DELICA PLUS  123XX123) MISC Use to check blood glucose once a day (Dx: 11.9 type 2 DM   losartan (COZAAR) 100 MG tablet TAKE 1 TABLET BY MOUTH EVERY DAY   meloxicam (MOBIC) 7.5 mg, Oral, Daily   metFORMIN (GLUCOPHAGE) 500 MG tablet TAKE 1 TABLET BY MOUTH EVERY DAY WITH BREAKFAST   rosuvastatin (CRESTOR) 10 MG tablet  TAKE 1 TABLET BY MOUTH EVERY DAY   spironolactone (ALDACTONE) 12.5 mg, Oral, Daily       Objective:   Physical Exam BP 136/76   Pulse 64   Temp 97.6 F (36.4 C) (Oral)   Resp 18   Ht 6' (1.829 m)   Wt (!) 300 lb 8 oz (136.3 kg)   SpO2 97%   BMI 40.76 kg/m  General: Well developed, NAD, BMI noted Neck: No  thyromegaly  HEENT:  Normocephalic . Face symmetric, atraumatic Lungs:  CTA B Normal respiratory effort, no intercostal retractions, no accessory muscle use. Heart: RRR,  no murmur.  Abdomen:  Not distended, soft, non-tender. No rebound or rigidity.   DM foot exam: Good pedal pulses, no pitting edema, pinprick examination: Decreased sensation distally bilaterally. Skin: Exposed areas without rash. Not pale. Not jaundice Neurologic:  alert & oriented X3.  Speech normal, gait and transferring limited, uses a cane.  Main limiting factors are BMI and DJD. Strength symmetric and appropriate for age.  Psych: Cognition and judgment appear intact.  Cooperative with normal attention span and concentration.  Behavior appropriate. No anxious or depressed appearing.     Assessment    Assessment DM + Neuropathy Dx 01-2021 HTN Hyperlipidemia Gout  Morbid obesity Snoring  CV: -Palpitations, PVCs, h/o bradycardia,s/p. Dr Adin Hector day monitor, echo 2014: Normal LV, some LVH  - New onset acute syst CHF 10/2019>>  S/p catheterization 11/11/2019: No CAD: Normal EF. Myasthenia gravis (ocular, Dr Tomi Likens)  h/o detached retina before  Stasis dermatitis T-cell lymphoma, angioimmunoblastic, DX 08-2019  PLAN: Here for CPX DM: On metformin, check A1c.  Ambulatory CBGs usually  between 115 and 135 Neuropathy: Likely from DM, for completeness we will check 123456 and folic acid.  Feet care discussed.  See AVS. High cholesterol: On Crestor, last LFTs normal, check FLP Cardiovascular: Seen at the advanced heart failure clinic 12/12/2022, they noted that transient LV dysfunction possibly due to critical illness. H/O Lymphoma: Saw oncology 10/04/2022, still in remission.  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  Started to change his diet and some weight loss noted.  Encouraged to continue on the same path. Gout: On allopurinol, no recent events. Morbid obesity: Started to lose weight, eating healthier, see above, praised RTC 6 months   In addition to CPX, address all his chronic medical problems.  Also notes from cardiology and hematology reviewed

## 2023-01-08 NOTE — Patient Instructions (Addendum)
Vaccines I recommend:  Covid booster RSV vaccine  Please bring [place] a copy of your Healthcare Power of Attorney for your chart.    Check the  blood pressure regularly BP GOAL is between 110/65 and  135/85. If it is consistently higher or lower, let me know  Diabetes: You can check your sugars at different times, they right times to do it are: - early in AM fasting  ( blood sugar goal 70-130) - 2 hours after a meal (blood sugar goal less than 180)   GO TO THE LAB : Get the blood work     [place], [place] Come back for checkup in 6 months

## 2023-01-10 ENCOUNTER — Other Ambulatory Visit (INDEPENDENT_AMBULATORY_CARE_PROVIDER_SITE_OTHER): Payer: PPO

## 2023-01-10 ENCOUNTER — Telehealth: Payer: Self-pay | Admitting: *Deleted

## 2023-01-10 ENCOUNTER — Ambulatory Visit (INDEPENDENT_AMBULATORY_CARE_PROVIDER_SITE_OTHER): Payer: HMO | Admitting: Podiatry

## 2023-01-10 DIAGNOSIS — E1169 Type 2 diabetes mellitus with other specified complication: Secondary | ICD-10-CM

## 2023-01-10 DIAGNOSIS — M79674 Pain in right toe(s): Secondary | ICD-10-CM | POA: Diagnosis not present

## 2023-01-10 DIAGNOSIS — B351 Tinea unguium: Secondary | ICD-10-CM

## 2023-01-10 DIAGNOSIS — M79675 Pain in left toe(s): Secondary | ICD-10-CM

## 2023-01-10 NOTE — Telephone Encounter (Signed)
Noted  

## 2023-01-10 NOTE — Telephone Encounter (Signed)
Pt here on 01/08/23 and given urine cup for MALB.  Pt did not leave the specimen at the visit. Left message for pt to return my call and verify when he will be returning specimen.

## 2023-01-10 NOTE — Progress Notes (Signed)
This patient returns to my office for at risk foot care.  This patient requires this care by a professional since this patient will be at risk due to having diabetes type 2. This patient is having problems with his left knee and hip.  This patient is unable to cut nails himself since the patient cannot reach his nails.These nails are painful walking and wearing shoes.  This patient presents for at risk foot care today.  General Appearance  Alert, conversant and in no acute stress.  Vascular  Dorsalis pedis  are palpable  bilaterally. Posterior tibial pulses are absent  B/L. Capillary return is within normal limits  bilaterally. Cold feet  Bilaterally. Absent hair  B/L.  Neurologic  Senn-Weinstein monofilament wire test within normal limits/diminished   bilaterally. Muscle power within normal limits bilaterally.  Nails Thick disfigured discolored nails with subungual debris  Hallux nails. No evidence of bacterial infection or drainage bilaterally.  Orthopedic  No limitations of motion  feet .  No crepitus or effusions noted.  No bony pathology or digital deformities noted.  HAV  B/L.  Skin  normotropic skin with no porokeratosis noted bilaterally.  No signs of infections or ulcers noted.     Onychomycosis  Pain in right toes  Pain in left toes  Consent was obtained for treatment procedures.   Mechanical debridement of nails 1-5  bilaterally performed with a nail nipper.  Filed with dremel without incident.    Return office visit     3 months.                Told patient to return for periodic foot care and evaluation due to potential at risk complications.   Dannisha Eckmann DPM  

## 2023-01-10 NOTE — Addendum Note (Signed)
Addended by: Kelle Darting A on: 01/10/2023 08:52 AM   Modules accepted: Orders

## 2023-01-10 NOTE — Progress Notes (Signed)
Pt here for urine drop off.  Was unable to provide urine sample at previous office visit.  No charge today.

## 2023-01-10 NOTE — Telephone Encounter (Signed)
Pt returned my call and states he will return urine sample this week.  Test reordered as future status.

## 2023-01-11 LAB — MICROALBUMIN / CREATININE URINE RATIO
Creatinine,U: 94.3 mg/dL
Microalb Creat Ratio: 18.8 mg/g (ref 0.0–30.0)
Microalb, Ur: 17.7 mg/dL — ABNORMAL HIGH (ref 0.0–1.9)

## 2023-01-21 ENCOUNTER — Other Ambulatory Visit (HOSPITAL_COMMUNITY): Payer: Self-pay | Admitting: Internal Medicine

## 2023-03-21 ENCOUNTER — Ambulatory Visit (INDEPENDENT_AMBULATORY_CARE_PROVIDER_SITE_OTHER): Payer: PPO | Admitting: *Deleted

## 2023-03-21 VITALS — Ht 72.0 in | Wt 280.0 lb

## 2023-03-21 DIAGNOSIS — Z0000 Encounter for general adult medical examination without abnormal findings: Secondary | ICD-10-CM

## 2023-03-21 NOTE — Progress Notes (Signed)
Subjective:   [name] [name] is a 82 y.o. male who presents for Medicare Annual/Subsequent preventive examination.  I connected with  [name] [name] on 03/21/23 by a audio enabled telemedicine application and verified that I am speaking with the correct person using two identifiers.  Patient Location: Home  Provider Location: Office/Clinic  I discussed the limitations of evaluation and management by telemedicine. The patient expressed understanding and agreed to proceed.   Review of Systems     Cardiac Risk Factors include: advanced age (>15men, >68 women);male gender;obesity (BMI >30kg/m2);diabetes mellitus;dyslipidemia;hypertension     Objective:    Today's Vitals   03/21/23 1023  Weight: 280 lb (127 kg)  Height: 6' (1.829 m)  PainSc: 8    Body mass index is 37.97 kg/m.     03/21/2023   10:23 AM 10/04/2022    9:28 AM 04/12/2022   11:00 PM 04/04/2022   10:12 AM 03/10/2022   11:37 AM 10/04/2021    9:45 AM 12/13/2020   10:39 AM  Advanced Directives  Does Patient Have a Medical Advance Directive? Yes Yes Yes No Yes Yes Yes  Type of Diplomatic Services operational officer Living will Healthcare Power of [place];Living will  Healthcare Power of [place];Living will Healthcare Power of [employer];Living will Healthcare Power of [place];Living will  Does patient want to make changes to medical advance directive?  No - Patient declined  No - Patient declined  No - Patient declined No - Patient declined  Copy of Healthcare Power of Attorney in Chart? No - copy requested    No - copy requested No - copy requested   Would patient like information on creating a medical advance directive?    No - Patient declined  No - Patient declined     Current Medications (verified) Outpatient Encounter Medications as of 03/21/2023  Medication Sig   allopurinol (ZYLOPRIM) 300 MG tablet TAKE 1 TABLET BY MOUTH EVERY DAY   aspirin 81 MG chewable tablet Chew 81 mg by mouth daily.   Blood  Glucose Monitoring Suppl (ONETOUCH VERIO FLEX SYSTEM) w/Device KIT Use to check blood glucose once daily (DX: E11.9  type 2 diabetes) (Patient not taking: Reported on 11/08/2022)   carvedilol (COREG) 12.5 MG tablet TAKE 1 TABLET BY MOUTH TWICE A DAY   diphenhydramine-acetaminophen (TYLENOL PM) 25-500 MG TABS tablet Take 1 tablet by mouth at bedtime as needed.   furosemide (LASIX) 40 MG tablet TAKE 1 TABLET (40 MG TOTAL) BY MOUTH DAILY AS NEEDED FOR FLUID OR EDEMA. (Patient not taking: Reported on 03/21/2023)   glucose blood (ONETOUCH VERIO) test strip Use to check blood glucose once a day (Dx: 11.9 - type 2 DM) (Patient not taking: Reported on 11/08/2022)   KLOR-CON M10 10 MEQ tablet TAKE 1 TABLET BY MOUTH EVERY DAY (Patient not taking: Reported on 03/21/2023)   Lancets (ONETOUCH DELICA PLUS LANCET33G) MISC Use to check blood glucose once a day (Dx: 11.9 type 2 DM (Patient not taking: Reported on 11/08/2022)   losartan (COZAAR) 100 MG tablet TAKE 1 TABLET BY MOUTH EVERY DAY   metFORMIN (GLUCOPHAGE) 500 MG tablet TAKE 1 TABLET BY MOUTH EVERY DAY WITH BREAKFAST   Omega-3 Fatty Acids (FISH OIL) 1000 MG CAPS Take 2,000 mg by mouth daily.   rosuvastatin (CRESTOR) 10 MG tablet TAKE 1 TABLET BY MOUTH EVERY DAY   spironolactone (ALDACTONE) 25 MG tablet TAKE 1/2 TABLET BY MOUTH EVERY DAY   [DISCONTINUED] meloxicam (MOBIC) 7.5 MG tablet Take 7.5 mg by  mouth daily.   No facility-administered encounter medications on file as of 03/21/2023.    Allergies (verified) Doxycycline   History: Past Medical History:  Diagnosis Date   Arthritis    Complication of anesthesia     had spinal with knee surgeries  -"heart rate too low" for general   Detached retina, left    L eye normal vision, reports that he has had a retina procedure in a doctor's office at [facility]    Diabetes mellitus without complication (HCC)    Dysrhythmia    slight irregular rate   GERD (gastroesophageal reflux disease)    hx no problems now    H/O echocardiogram 2014   History of hiatal hernia    ?20 yrs ago   Hyperopia 2016   Hypertension    Lumbar stenosis with neurogenic claudication    MG, ocular (myasthenia gravis) (HCC)    ? of L eye,    Prediabetes    Presbyopia OU 2016   Past Surgical History:  Procedure Laterality Date   COLONOSCOPY     HERNIA REPAIR     bilateral inguinal hernia   HERNIA REPAIR     umbilical   IR IMAGING GUIDED PORT INSERTION  09/16/2019   IR REMOVAL TUN ACCESS W/ PORT W/O FL MOD SED  03/04/2020   JOINT REPLACEMENT  2009   right knee   LUMBAR LAMINECTOMY/DECOMPRESSION MICRODISCECTOMY N/A 06/06/2017   Procedure: Decompression L3-5, insitu fusion L3-5 ;  Surgeon: Venita Lick, MD;  Location: [facility] OR;  Service: Orthopedics;  Laterality: N/A;   LYMPH NODE BIOPSY Right 09/01/2019   Procedure: CERVICAL LYMPH NODE BIOPSY;  Surgeon: Flo Shanks, MD;  Location: [facility] OR;  Service: ENT;  Laterality: Right;   MIDDLE EAR SURGERY Right    ear -patched hole in ear drum   RIGHT/LEFT HEART CATH AND CORONARY ANGIOGRAPHY N/A 11/11/2019   Procedure: RIGHT/LEFT HEART CATH AND CORONARY ANGIOGRAPHY;  Surgeon: Dolores Patty, MD;  Location: [facility] INVASIVE CV LAB;  Service: Cardiovascular;  Laterality: N/A;   TOTAL KNEE ARTHROPLASTY  09/10/2012   Procedure: TOTAL KNEE ARTHROPLASTY;  Surgeon: Jacki Cones, MD;  Location: [facility] ORS;  Service: Orthopedics;  Laterality: Left;   TOTAL SHOULDER ARTHROPLASTY Right 06/22/2016   Procedure: RIGHT TOTAL SHOULDER ARTHROPLASTY;  Surgeon: Francena Hanly, MD;  Location: [facility] OR;  Service: Orthopedics;  Laterality: Right;   Family History  Problem Relation Age of Onset   Diabetes Brother    Colon cancer Brother 59       dx in 2018   Hypertension Sister    Prostate cancer Neg Hx    Stroke Neg Hx    CAD Neg Hx    Social History   Socioeconomic History   Marital status: Married    Spouse name: Not on file   Number of children: 4   Years of education: Not on file   Highest  education level: Not on file  Occupational History   Occupation: Systems analyst company-- retired 2010  Tobacco Use   Smoking status: Former    Packs/day: 0.50    Years: 2.00    Additional pack years: 0.00    Total pack years: 1.00    Types: Cigarettes    Quit date: 06/09/1966    Years since quitting: 56.8   Smokeless tobacco: Never  Vaping Use   Vaping Use: Never used  Substance and Sexual Activity   Alcohol use: No    Alcohol/week: 0.0 standard drinks of alcohol    Comment:  none   Drug use: No   Sexual activity: Yes    Partners: Female  Other Topics Concern   Not on file  Social History Narrative   married, 2nd wife at a nursing home, memory unit   Lives alone , lives independently     2 living children, lost 2 kids     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   Social Determinants of Health   Financial Resource Strain: Low Risk  (03/21/2023)   Overall Financial Resource Strain (CARDIA)    Difficulty of Paying Living Expenses: Not hard at all  Food Insecurity: No Food Insecurity (03/21/2023)   Hunger Vital Sign    Worried About Running Out of Food in the Last Year: Never true    Ran Out of Food in the Last Year: Never true  Transportation Needs: No Transportation Needs (03/21/2023)   PRAPARE - Administrator, Civil Service (Medical): No    Lack of Transportation (Non-Medical): No  Physical Activity: Insufficiently Active (03/10/2022)   Exercise Vital Sign    Days of Exercise per Week: 7 days    Minutes of Exercise per Session: 20 min  Stress: No Stress Concern Present (03/10/2022)   [employer] of Occupational Health - Occupational Stress Questionnaire    Feeling of Stress : Only a little  Social Connections: Socially Integrated (11/08/2022)   Social Connection and Isolation Panel [NHANES]    Frequency of Communication with Friends and Family: Three times a week    Frequency of Social Gatherings with Friends and Family: Twice a week    Attends Religious  Services: More than 4 times per year    Active Member of [employer] or Organizations: Yes    Attends Engineer, structural: More than 4 times per year    Marital Status: Married    Tobacco Counseling Counseling given: Not Answered   Clinical Intake:  Pre-visit preparation completed: Yes  Pain : 0-10 Pain Score: 8  Pain Location: Hip Pain Orientation: Left Pain Descriptors / Indicators: Constant, Aching Pain Onset: More than a month ago Pain Frequency: Constant  BMI - recorded: 37.97 Nutritional Status: BMI > 30  Obese Nutritional Risks: None Diabetes: Yes CBG done?: No Did pt. bring in CBG monitor from home?: No  How often do you need to have someone help you when you read instructions, pamphlets, or other written materials from your doctor or pharmacy?: 1 - Never   Activities of Daily Living    03/21/2023   10:29 AM  In your present state of health, do you have any difficulty performing the following activities:  Hearing? 0  Vision? 0  Comment wears readers  Difficulty concentrating or making decisions? 0  Walking or climbing stairs? 1  Dressing or bathing? 0  Doing errands, shopping? 0  Preparing Food and eating ? N  Using the Toilet? N  In the past six months, have you accidently leaked urine? N  Managing your Medications? N  Managing your Finances? N  Housekeeping or managing your Housekeeping? N    Patient Care Team: Wanda Plump, MD as PCP - General [name], Deloris Ping, MD as PCP - Cardiology (Cardiology) Bernette Redbird, MD as Consulting Physician (Gastroenterology) Myna Hidalgo Rose Phi, MD as Medical Oncologist (Oncology) Manning Charity, OD as Referring Physician (Optometry) Henrene Pastor, RPH-CPP (Pharmacist) Bensimhon, Bevelyn Buckles, MD as Consulting Physician (Cardiology) Helane Gunther, DPM as Consulting Physician (Podiatry)  Indicate any recent Medical Services you may have received from  other than [facility] providers in the past year (date may be  approximate).     Assessment:   This is a routine wellness examination for [place].  Hearing/Vision screen No results found.  Dietary issues and exercise activities discussed: Current Exercise Habits: The patient does not participate in regular exercise at present, Exercise limited by: orthopedic condition(s)   Goals Addressed   None    Depression Screen    03/21/2023   10:29 AM 01/08/2023    9:01 AM 09/13/2022    2:46 PM 05/09/2022    8:33 AM 03/10/2022   11:35 AM 12/14/2021    9:15 AM 07/08/2021   10:54 AM  PHQ 2/9 Scores  PHQ - 2 Score 0 0 0 0 0 0 2  PHQ- 9 Score       7    Fall Risk    03/21/2023   10:27 AM 01/08/2023    9:01 AM 09/13/2022    2:47 PM 05/09/2022    8:33 AM 03/10/2022   11:28 AM  Fall Risk   Falls in the past year? 0 1 0 1 1  Number falls in past yr: 0 0 0 0 0  Injury with Fall? 0 1 0 1 0  Risk for fall due to : Orthopedic patient    History of fall(s);Orthopedic patient  Follow up Falls evaluation completed Falls evaluation completed;Education provided Falls evaluation completed Falls evaluation completed Falls prevention discussed;Education provided    FALL RISK PREVENTION PERTAINING TO THE HOME:  Any stairs in or around the home? Yes  If so, are there any without handrails? No  Home free of loose throw rugs in walkways, pet beds, electrical cords, etc? Yes  Adequate lighting in your home to reduce risk of falls? Yes   ASSISTIVE DEVICES UTILIZED TO PREVENT FALLS:  Life alert? No  Use of a cane, walker or w/c? Yes  Grab bars in the bathroom? Yes  Shower chair or bench in shower? No  Elevated toilet seat or a handicapped toilet? Yes   TIMED UP AND GO:  Was the test performed?  No, audio visit .    Cognitive Function:    12/07/2017   10:20 AM 12/06/2016    8:52 AM  MMSE - Mini Mental State Exam  Orientation to time 5 5  Orientation to Place 5 5  Registration 3 3  Attention/ Calculation 3 2  Recall 3 3  Language- name 2 objects 2 2   Language- repeat 1 1  Language- follow 3 step command 3 3  Language- read & follow direction 1 1  Write a sentence 1 1  Copy design 1 1  Total score 28 27        03/21/2023   10:34 AM 03/10/2022   11:38 AM  6CIT Screen  What Year? 0 points 0 points  What month? 0 points 0 points  What time? 0 points 0 points  Count back from 20 0 points 0 points  Months in reverse 4 points 0 points  Repeat phrase 0 points 6 points  Total Score 4 points 6 points    Immunizations Immunization History  Administered Date(s) Administered   Fluad Quad(high Dose 65+) 06/20/2019, 10/04/2020, 09/13/2022   H1N1 11/12/2008   Influenza Split 07/17/2014, 07/31/2018, 10/18/2021   Influenza Whole 08/04/2010   Influenza, High Dose Seasonal PF 10/18/2021   Influenza,inj,Quad PF,6+ Mos 06/23/2015   Influenza-Unspecified 07/23/2013, 07/07/2016, 07/20/2017   Moderna Sars-Covid-2 Vaccination 03/16/2020, 04/14/2020   PNEUMOCOCCAL CONJUGATE-20 12/14/2021  Pneumococcal Conjugate-13 08/12/2014   Pneumococcal Polysaccharide-23 11/12/2008, 05/28/2019   Td 12/19/2007   Tdap 04/04/2018   Zoster Recombinat (Shingrix) 12/20/2018, 05/12/2019   Zoster, Live 10/24/2011    TDAP status: Up to date  Flu Vaccine status: Up to date  Pneumococcal vaccine status: Up to date  Covid-19 vaccine status: Information provided on how to obtain vaccines.   Qualifies for Shingles Vaccine? Yes   Zostavax completed Yes   Shingrix Completed?: Yes  Screening Tests Health Maintenance  Topic Date Due   COVID-19 Vaccine (3 - Moderna risk series) 05/12/2020   Medicare Annual Wellness (AWV)  03/11/2023   INFLUENZA VACCINE  05/24/2023   HEMOGLOBIN A1C  07/11/2023   Diabetic kidney evaluation - eGFR measurement  10/05/2023   OPHTHALMOLOGY EXAM  12/05/2023   FOOT EXAM  01/08/2024   Diabetic kidney evaluation - Urine ACR  01/10/2024   DTaP/Tdap/Td (3 - Td or Tdap) 04/04/2028   Pneumonia Vaccine 23+ Years old  Completed   Zoster  Vaccines- Shingrix  Completed   HPV VACCINES  Aged Out   Colonoscopy  Discontinued   Hepatitis C Screening  Discontinued    Health Maintenance  Health Maintenance Due  Topic Date Due   COVID-19 Vaccine (3 - Moderna risk series) 05/12/2020   Medicare Annual Wellness (AWV)  03/11/2023    Colorectal cancer screening: No longer required.   Lung Cancer Screening: (Low Dose CT Chest recommended if Age 39-80 years, 30 pack-year currently smoking OR have quit w/in 15years.) does not qualify.   Additional Screening:  Hepatitis C Screening: does not qualify  Vision Screening: Recommended annual ophthalmology exams for early detection of glaucoma and other disorders of the eye. Is the patient up to date with their annual eye exam?  Yes  Who is the provider or what is the name of the office in which the patient attends annual eye exams? Can't remember name at this time If pt is not established with a provider, would they like to be referred to a provider to establish care? No .   Dental Screening: Recommended annual dental exams for proper oral hygiene  Community Resource Referral / Chronic Care Management: CRR required this visit?  No   CCM required this visit?  No      Plan:     I have personally reviewed and noted the following in the patient's chart:   Medical and social history Use of alcohol, tobacco or illicit drugs  Current medications and supplements including opioid prescriptions. Patient is not currently taking opioid prescriptions. Functional ability and status Nutritional status Physical activity Advanced directives List of other physicians Hospitalizations, surgeries, and ER visits in previous 12 months Vitals Screenings to include cognitive, depression, and falls Referrals and appointments  In addition, I have reviewed and discussed with patient certain preventive protocols, quality metrics, and best practice recommendations. A written personalized care plan  for preventive services as well as general preventive health recommendations were provided to patient.   Due to this being a telephonic visit, the after visit summary with patients personalized plan was offered to patient via mail or my-chart. Per request, patient was mailed a copy of AVS.  Donne Anon, CMA   03/21/2023   Nurse Notes: None

## 2023-03-21 NOTE — Patient Instructions (Signed)
[name] , Thank you for taking time to come for your Medicare Wellness Visit. I appreciate your ongoing commitment to your health goals. Please review the following plan we discussed and let me know if I can assist you in the future.      This is a list of the screening recommended for you and due dates:  Health Maintenance  Topic Date Due   COVID-19 Vaccine (3 - Moderna risk series) 05/12/2020   Flu Shot  05/24/2023   Hemoglobin A1C  07/11/2023   Yearly kidney function blood test for diabetes  10/05/2023   Eye exam for diabetics  12/05/2023   Complete foot exam   01/08/2024   Yearly kidney health urinalysis for diabetes  01/10/2024   Medicare Annual Wellness Visit  03/20/2024   DTaP/Tdap/Td vaccine (3 - Td or Tdap) 04/04/2028   Pneumonia Vaccine  Completed   Zoster (Shingles) Vaccine  Completed   HPV Vaccine  Aged Out   Colon Cancer Screening  Discontinued   Hepatitis C Screening  Discontinued    Next appointment: Follow up in one year for your annual wellness visit.   Preventive Care 39 Years and Older, Male Preventive care refers to lifestyle choices and visits with your health care provider that can promote health and wellness. What does preventive care include? A yearly physical exam. This is also called an annual well check. Dental exams once or twice a year. Routine eye exams. Ask your health care provider how often you should have your eyes checked. Personal lifestyle choices, including: Daily care of your teeth and gums. Regular physical activity. Eating a healthy diet. Avoiding tobacco and drug use. Limiting alcohol use. Practicing safe sex. Taking low doses of aspirin every day. Taking vitamin and mineral supplements as recommended by your health care provider. What happens during an annual well check? The services and screenings done by your health care provider during your annual well check will depend on your age, overall health, lifestyle risk factors, and  family history of disease. Counseling  Your health care provider may ask you questions about your: Alcohol use. Tobacco use. Drug use. Emotional well-being. Home and relationship well-being. Sexual activity. Eating habits. History of falls. Memory and ability to understand (cognition). Work and work Astronomer. Screening  You may have the following tests or measurements: Height, weight, and BMI. Blood pressure. Lipid and cholesterol levels. These may be checked every 5 years, or more frequently if you are over 54 years old. Skin check. Lung cancer screening. You may have this screening every year starting at age 65 if you have a 30-pack-year history of smoking and currently smoke or have quit within the past 15 years. Fecal occult blood test (FOBT) of the stool. You may have this test every year starting at age 91. Flexible sigmoidoscopy or colonoscopy. You may have a sigmoidoscopy every 5 years or a colonoscopy every 10 years starting at age 57. Prostate cancer screening. Recommendations will vary depending on your family history and other risks. Hepatitis C blood test. Hepatitis B blood test. Sexually transmitted disease (STD) testing. Diabetes screening. This is done by checking your blood sugar (glucose) after you have not eaten for a while (fasting). You may have this done every 1-3 years. Abdominal aortic aneurysm (AAA) screening. You may need this if you are a current or former smoker. Osteoporosis. You may be screened starting at age 4 if you are at high risk. Talk with your health care provider about your test results, treatment  options, and if necessary, the need for more tests. Vaccines  Your health care provider may recommend certain vaccines, such as: Influenza vaccine. This is recommended every year. Tetanus, diphtheria, and acellular pertussis (Tdap, Td) vaccine. You may need a Td booster every 10 years. Zoster vaccine. You may need this after age 58. Pneumococcal  13-valent conjugate (PCV13) vaccine. One dose is recommended after age 50. Pneumococcal polysaccharide (PPSV23) vaccine. One dose is recommended after age 21. Talk to your health care provider about which screenings and vaccines you need and how often you need them. This information is not intended to replace advice given to you by your health care provider. Make sure you discuss any questions you have with your health care provider. Document Released: 11/05/2015 Document Revised: 06/28/2016 Document Reviewed: 08/10/2015 Elsevier Interactive Patient Education  2017 [employer].  Fall Prevention in the Home Falls can cause injuries. They can happen to people of all ages. There are many things you can do to make your home safe and to help prevent falls. What can I do on the outside of my home? Regularly fix the edges of walkways and driveways and fix any cracks. Remove anything that might make you trip as you walk through a door, such as a raised step or threshold. Trim any bushes or trees on the path to your home. Use bright outdoor lighting. Clear any walking paths of anything that might make someone trip, such as rocks or tools. Regularly check to see if handrails are loose or broken. Make sure that both sides of any steps have handrails. Any raised decks and porches should have guardrails on the edges. Have any leaves, snow, or ice cleared regularly. Use sand or salt on walking paths during winter. Clean up any spills in your garage right away. This includes oil or grease spills. What can I do in the bathroom? Use night lights. Install grab bars by the toilet and in the tub and shower. Do not use towel bars as grab bars. Use non-skid mats or decals in the tub or shower. If you need to sit down in the shower, use a plastic, non-slip stool. Keep the floor dry. Clean up any water that spills on the floor as soon as it happens. Remove soap buildup in the tub or shower regularly. Attach  bath mats securely with double-sided non-slip rug tape. Do not have throw rugs and other things on the floor that can make you trip. What can I do in the bedroom? Use night lights. Make sure that you have a light by your bed that is easy to reach. Do not use any sheets or blankets that are too big for your bed. They should not hang down onto the floor. Have a firm chair that has side arms. You can use this for support while you get dressed. Do not have throw rugs and other things on the floor that can make you trip. What can I do in the kitchen? Clean up any spills right away. Avoid walking on wet floors. Keep items that you use a lot in easy-to-reach places. If you need to reach something above you, use a strong step stool that has a grab bar. Keep electrical cords out of the way. Do not use floor polish or wax that makes floors slippery. If you must use wax, use non-skid floor wax. Do not have throw rugs and other things on the floor that can make you trip. What can I do with my stairs? Do not  leave any items on the stairs. Make sure that there are handrails on both sides of the stairs and use them. Fix handrails that are broken or loose. Make sure that handrails are as long as the stairways. Check any carpeting to make sure that it is firmly attached to the stairs. Fix any carpet that is loose or worn. Avoid having throw rugs at the top or bottom of the stairs. If you do have throw rugs, attach them to the floor with carpet tape. Make sure that you have a light switch at the top of the stairs and the bottom of the stairs. If you do not have them, ask someone to add them for you. What else can I do to help prevent falls? Wear shoes that: Do not have high heels. Have rubber bottoms. Are comfortable and fit you well. Are closed at the toe. Do not wear sandals. If you use a stepladder: Make sure that it is fully opened. Do not climb a closed stepladder. Make sure that both sides of the  stepladder are locked into place. Ask someone to hold it for you, if possible. Clearly mark and make sure that you can see: Any grab bars or handrails. First and last steps. Where the edge of each step is. Use tools that help you move around (mobility aids) if they are needed. These include: Canes. Walkers. Scooters. Crutches. Turn on the lights when you go into a dark area. Replace any light bulbs as soon as they burn out. Set up your furniture so you have a clear path. Avoid moving your furniture around. If any of your floors are uneven, fix them. If there are any pets around you, be aware of where they are. Review your medicines with your doctor. Some medicines can make you feel dizzy. This can increase your chance of falling. Ask your doctor what other things that you can do to help prevent falls. This information is not intended to replace advice given to you by your health care provider. Make sure you discuss any questions you have with your health care provider. Document Released: 08/05/2009 Document Revised: 03/16/2016 Document Reviewed: 11/13/2014 Elsevier Interactive Patient Education  2017 [employer].

## 2023-04-04 ENCOUNTER — Encounter: Payer: Self-pay | Admitting: Hematology & Oncology

## 2023-04-04 ENCOUNTER — Inpatient Hospital Stay: Payer: PPO | Attending: Hematology & Oncology

## 2023-04-04 ENCOUNTER — Other Ambulatory Visit: Payer: Self-pay

## 2023-04-04 ENCOUNTER — Inpatient Hospital Stay (HOSPITAL_BASED_OUTPATIENT_CLINIC_OR_DEPARTMENT_OTHER): Payer: PPO | Admitting: Hematology & Oncology

## 2023-04-04 VITALS — BP 160/62 | HR 55 | Temp 97.5°F | Resp 19 | Ht 72.0 in | Wt 281.1 lb

## 2023-04-04 DIAGNOSIS — C8440 Peripheral T-cell lymphoma, not classified, unspecified site: Secondary | ICD-10-CM | POA: Diagnosis not present

## 2023-04-04 DIAGNOSIS — E119 Type 2 diabetes mellitus without complications: Secondary | ICD-10-CM | POA: Insufficient documentation

## 2023-04-04 DIAGNOSIS — Z79899 Other long term (current) drug therapy: Secondary | ICD-10-CM | POA: Insufficient documentation

## 2023-04-04 DIAGNOSIS — Z9221 Personal history of antineoplastic chemotherapy: Secondary | ICD-10-CM | POA: Insufficient documentation

## 2023-04-04 LAB — CBC WITH DIFFERENTIAL (CANCER CENTER ONLY)
Abs Immature Granulocytes: 0.03 10*3/uL (ref 0.00–0.07)
Basophils Absolute: 0.1 10*3/uL (ref 0.0–0.1)
Basophils Relative: 1 %
Eosinophils Absolute: 0.2 10*3/uL (ref 0.0–0.5)
Eosinophils Relative: 3 %
HCT: 39.6 % (ref 39.0–52.0)
Hemoglobin: 13.6 g/dL (ref 13.0–17.0)
Immature Granulocytes: 0 %
Lymphocytes Relative: 17 %
Lymphs Abs: 1.3 10*3/uL (ref 0.7–4.0)
MCH: 31.5 pg (ref 26.0–34.0)
MCHC: 34.3 g/dL (ref 30.0–36.0)
MCV: 91.7 fL (ref 80.0–100.0)
Monocytes Absolute: 0.6 10*3/uL (ref 0.1–1.0)
Monocytes Relative: 8 %
Neutro Abs: 5.4 10*3/uL (ref 1.7–7.7)
Neutrophils Relative %: 71 %
Platelet Count: 218 10*3/uL (ref 150–400)
RBC: 4.32 MIL/uL (ref 4.22–5.81)
RDW: 12.9 % (ref 11.5–15.5)
WBC Count: 7.7 10*3/uL (ref 4.0–10.5)
nRBC: 0 % (ref 0.0–0.2)

## 2023-04-04 LAB — CMP (CANCER CENTER ONLY)
ALT: 10 U/L (ref 0–44)
AST: 10 U/L — ABNORMAL LOW (ref 15–41)
Albumin: 4.6 g/dL (ref 3.5–5.0)
Alkaline Phosphatase: 76 U/L (ref 38–126)
Anion gap: 8 (ref 5–15)
BUN: 14 mg/dL (ref 8–23)
CO2: 27 mmol/L (ref 22–32)
Calcium: 9.8 mg/dL (ref 8.9–10.3)
Chloride: 105 mmol/L (ref 98–111)
Creatinine: 1.11 mg/dL (ref 0.61–1.24)
GFR, Estimated: >60 mL/min (ref >60)
Glucose, Bld: 113 mg/dL — ABNORMAL HIGH (ref 70–99)
Potassium: 4.6 mmol/L (ref 3.5–5.1)
Sodium: 140 mmol/L (ref 135–145)
Total Bilirubin: 0.5 mg/dL (ref 0.3–1.2)
Total Protein: 7 g/dL (ref 6.5–8.1)

## 2023-04-04 LAB — LACTATE DEHYDROGENASE: LDH: 132 U/L (ref 98–192)

## 2023-04-04 NOTE — Progress Notes (Signed)
This man Hematology and Oncology Follow Up Visit  [name] [name] [id number] 1941/02/05 82 y.o. 04/04/2023   Principle Diagnosis:  Stage IV angioimmunoblastic T-cell NHL  Current Therapy:   S/p CHP + Adcetris x 6 cycles -- completed on 01/19/2020     Interim History:  [name] [name] is back for follow-up.  It sounds like he is going need to have hip surgery.  His left hip is not doing all that well.  He is still out playing gospel music.  I know he travels quite a bit.  His poor wife is still in a nursing home.  She has dementia.  He has had no problems with bowels or bladder.  He has had no cough or shortness of breath.  He has had no problems with fever.  He has had no bleeding.  There is been no rashes.  He does have diabetes.  He has to lose about 40 pounds before he can have the hip surgery.  Overall, I would say that his performance status is probably ECOG 1.   Medications:  Current Outpatient Medications:    allopurinol (ZYLOPRIM) 300 MG tablet, TAKE 1 TABLET BY MOUTH EVERY DAY, Disp: 90 tablet, Rfl: 1   aspirin 81 MG chewable tablet, Chew 81 mg by mouth daily., Disp: , Rfl:    Blood Glucose Monitoring Suppl (ONETOUCH VERIO FLEX SYSTEM) w/Device KIT, Use to check blood glucose once daily (DX: E11.9  type 2 diabetes), Disp: 1 kit, Rfl: 0   carvedilol (COREG) 12.5 MG tablet, TAKE 1 TABLET BY MOUTH TWICE A DAY, Disp: 180 tablet, Rfl: 1   diphenhydramine-acetaminophen (TYLENOL PM) 25-500 MG TABS tablet, Take 1 tablet by mouth at bedtime as needed., Disp: , Rfl:    furosemide (LASIX) 40 MG tablet, TAKE 1 TABLET (40 MG TOTAL) BY MOUTH DAILY AS NEEDED FOR FLUID OR EDEMA., Disp: 90 tablet, Rfl: 1   glucose blood (ONETOUCH VERIO) test strip, Use to check blood glucose once a day (Dx: 11.9 - type 2 DM), Disp: 100 each, Rfl: 3   KLOR-CON M10 10 MEQ tablet, TAKE 1 TABLET BY MOUTH EVERY DAY, Disp: 90 tablet, Rfl: 1   Lancets (ONETOUCH DELICA PLUS LANCET33G) MISC, Use to check blood glucose  once a day (Dx: 11.9 type 2 DM, Disp: 100 each, Rfl: 3   losartan (COZAAR) 100 MG tablet, TAKE 1 TABLET BY MOUTH EVERY DAY, Disp: 90 tablet, Rfl: 1   metFORMIN (GLUCOPHAGE) 500 MG tablet, TAKE 1 TABLET BY MOUTH EVERY DAY WITH BREAKFAST, Disp: 90 tablet, Rfl: 1   Omega-3 Fatty Acids (FISH OIL) 1000 MG CAPS, Take 2,000 mg by mouth daily., Disp: , Rfl:    rosuvastatin (CRESTOR) 10 MG tablet, TAKE 1 TABLET BY MOUTH EVERY DAY, Disp: 90 tablet, Rfl: 0   spironolactone (ALDACTONE) 25 MG tablet, TAKE 1/2 TABLET BY MOUTH EVERY DAY, Disp: 45 tablet, Rfl: 7  Allergies:  Allergies  Allergen Reactions   Doxycycline Rash    Past Medical History, Surgical history, Social history, and Family History were reviewed and updated.  Review of Systems: Review of Systems  Constitutional: Negative.   HENT:  Negative.    Eyes: Negative.   Respiratory:  Positive for shortness of breath.   Cardiovascular:  Positive for leg swelling.  Gastrointestinal:  Positive for nausea.  Endocrine: Negative.   Genitourinary: Negative.    Musculoskeletal:  Positive for arthralgias.  Skin: Negative.   Neurological: Negative.   Hematological: Negative.   Psychiatric/Behavioral: Negative.  Physical Exam:  height is 6' (1.829 m) and weight is 281 lb 1.3 oz (127.5 kg). His oral temperature is 97.5 F (36.4 C) (abnormal). His blood pressure is 160/62 (abnormal) and his pulse is 55 (abnormal). His respiration is 19.   Wt Readings from Last 3 Encounters:  04/04/23 281 lb 1.3 oz (127.5 kg)  03/21/23 280 lb (127 kg)  01/08/23 (!) 300 lb 8 oz (136.3 kg)    Physical Exam Vitals reviewed.  HENT:     Head: Normocephalic and atraumatic.  Eyes:     Pupils: Pupils are equal, round, and reactive to light.  Cardiovascular:     Rate and Rhythm: Normal rate and regular rhythm.     Heart sounds: Normal heart sounds.  Pulmonary:     Effort: Pulmonary effort is normal.     Breath sounds: Normal breath sounds.  Abdominal:      General: Bowel sounds are normal.     Palpations: Abdomen is soft.  Musculoskeletal:        General: No tenderness or deformity. Normal range of motion.     Cervical back: Normal range of motion.  Lymphadenopathy:     Cervical: No cervical adenopathy.  Skin:    General: Skin is warm and dry.     Findings: No erythema or rash.  Neurological:     Mental Status: He is alert and oriented to person, place, and time.  Psychiatric:        Behavior: Behavior normal.        Thought Content: Thought content normal.        Judgment: Judgment normal.      Lab Results  Component Value Date   WBC 7.7 04/04/2023   HGB 13.6 04/04/2023   HCT 39.6 04/04/2023   MCV 91.7 04/04/2023   PLT 218 04/04/2023     Chemistry      Component Value Date/Time   NA 140 04/04/2023 0901   K 4.6 04/04/2023 0901   CL 105 04/04/2023 0901   CO2 27 04/04/2023 0901   BUN 14 04/04/2023 0901   CREATININE 1.11 04/04/2023 0901   CREATININE 1.24 (H) 09/13/2022 1537      Component Value Date/Time   CALCIUM 9.8 04/04/2023 0901   ALKPHOS 76 04/04/2023 0901   AST 10 (L) 04/04/2023 0901   ALT 10 04/04/2023 0901   BILITOT 0.5 04/04/2023 0901      Impression and Plan: [name] [name] is a very nice 82 year old white male.  He had stage IV T-cell lymphoma.  This was an angioimmunoblastic lymphoma.  He was treated with chemotherapy and targeted therapy.  He completed 6 cycles of treatment back on 01/19/2020.  He went into remission.  He is still in remission.  I do not see a problem with him having hip surgery.  Again, if he needs to have left hip surgery from my point of view, he should be okay.  I do not see any issues with him healing outside of him having diabetes.  I think this would be the biggest problem with him having hip surgery.  We will still plan for follow-up in 6 months.  [name] Macho, MD 6/12/202410:53 AM

## 2023-04-10 ENCOUNTER — Encounter: Payer: Self-pay | Admitting: Hematology

## 2023-04-12 ENCOUNTER — Ambulatory Visit: Payer: HMO | Admitting: Podiatry

## 2023-04-16 ENCOUNTER — Encounter: Payer: Self-pay | Admitting: Podiatry

## 2023-04-16 ENCOUNTER — Ambulatory Visit (INDEPENDENT_AMBULATORY_CARE_PROVIDER_SITE_OTHER): Payer: PPO | Admitting: Podiatry

## 2023-04-16 DIAGNOSIS — M79675 Pain in left toe(s): Secondary | ICD-10-CM | POA: Diagnosis not present

## 2023-04-16 DIAGNOSIS — E1169 Type 2 diabetes mellitus with other specified complication: Secondary | ICD-10-CM | POA: Diagnosis not present

## 2023-04-16 DIAGNOSIS — B351 Tinea unguium: Secondary | ICD-10-CM

## 2023-04-16 DIAGNOSIS — L608 Other nail disorders: Secondary | ICD-10-CM | POA: Diagnosis not present

## 2023-04-16 DIAGNOSIS — M79674 Pain in right toe(s): Secondary | ICD-10-CM | POA: Diagnosis not present

## 2023-04-16 NOTE — Progress Notes (Signed)
This patient returns to my office for at risk foot care.  This patient requires this care by a professional since this patient will be at risk due to having diabetes type 2. This patient is having problems with his left knee and hip.  This patient is unable to cut nails himself since the patient cannot reach his nails.These nails are painful walking and wearing shoes.  This patient presents for at risk foot care today.  General Appearance  Alert, conversant and in no acute stress.  Vascular  Dorsalis pedis  are palpable  bilaterally. Posterior tibial pulses are absent  B/L. Capillary return is within normal limits  bilaterally. Cold feet  Bilaterally. Absent hair  B/L.  Neurologic  Senn-Weinstein monofilament wire test within normal limits/diminished   bilaterally. Muscle power within normal limits bilaterally.  Nails Thick disfigured discolored nails with subungual debris  Hallux nails. No evidence of bacterial infection or drainage bilaterally.  Orthopedic  No limitations of motion  feet .  No crepitus or effusions noted.  No bony pathology or digital deformities noted.  HAV  B/L.  Skin  normotropic skin with no porokeratosis noted bilaterally.  No signs of infections or ulcers noted.     Onychomycosis  Pain in right toes  Pain in left toes  Consent was obtained for treatment procedures.   Mechanical debridement of nails 1-5  bilaterally performed with a nail nipper.  Filed with dremel without incident.    Return office visit     3 months.                Told patient to return for periodic foot care and evaluation due to potential at risk complications.   Anderson Coppock DPM  

## 2023-04-18 DIAGNOSIS — Z515 Encounter for palliative care: Secondary | ICD-10-CM | POA: Diagnosis not present

## 2023-04-18 DIAGNOSIS — E119 Type 2 diabetes mellitus without complications: Secondary | ICD-10-CM | POA: Diagnosis not present

## 2023-04-18 DIAGNOSIS — M1990 Unspecified osteoarthritis, unspecified site: Secondary | ICD-10-CM | POA: Diagnosis not present

## 2023-04-18 DIAGNOSIS — Z6838 Body mass index (BMI) 38.0-38.9, adult: Secondary | ICD-10-CM | POA: Diagnosis not present

## 2023-04-19 ENCOUNTER — Other Ambulatory Visit (HOSPITAL_COMMUNITY): Payer: Self-pay | Admitting: Internal Medicine

## 2023-04-19 ENCOUNTER — Other Ambulatory Visit: Payer: Self-pay | Admitting: Internal Medicine

## 2023-05-11 DIAGNOSIS — M1612 Unilateral primary osteoarthritis, left hip: Secondary | ICD-10-CM | POA: Diagnosis not present

## 2023-05-16 ENCOUNTER — Telehealth: Payer: Self-pay

## 2023-05-16 NOTE — Telephone Encounter (Signed)
Patient is primarily followed by [facility]. Please advise regarding request for Left Total Hip Arthroplasty.   Thank you!

## 2023-05-16 NOTE — Telephone Encounter (Signed)
   Pre-operative Risk Assessment    Patient Name: [name] [name]  DOB: [birth year] MRN: [id number]      Request for Surgical Clearance    Procedure:   Left Total Hip Arthroplasty  Date of Surgery:  Clearance 07/17/23                                 Surgeon:  Dr. Durene Romans Surgeon's Group or Practice Name:  [facility] Phone number:  [phone] Rosalva Ferron Fax number:  [phone]   Type of Clearance Requested:   - Medical  - Pharmacy:  Hold Aspirin pt will need instructions on when/if to hold   Type of Anesthesia:  Spinal   Additional requests/questions:    SignedZada Finders   05/16/2023, 9:43 AM

## 2023-05-17 ENCOUNTER — Telehealth: Payer: Self-pay

## 2023-05-17 NOTE — Telephone Encounter (Signed)
Patient has normal kidney function, well-controlled diabetes.  Cleared from my side pending cardiology review

## 2023-05-17 NOTE — Telephone Encounter (Signed)
Received fax confirmation

## 2023-05-17 NOTE — Telephone Encounter (Signed)
Received surgical clearance from [facility]- Pt scheduled for L total hip on 07/17/23 with Dr. Charlann Boxer. They have requested separate cardiology clearance. Last OV 12/2022. Please advise if appt is needed.

## 2023-05-17 NOTE — Telephone Encounter (Signed)
Form completed and faxed back to ATTN: Rosalva Ferron at [phone]. Form sent for scanning.

## 2023-05-18 NOTE — Telephone Encounter (Signed)
   Patient Name: [name] [name]  DOB: Feb 10, 1941 MRN: [id number]  Primary Cardiologist: Kristeen Miss, MD  Chart reviewed as part of pre-operative protocol coverage. Given past medical history and time since last visit, based on ACC/AHA guidelines, DELLON REVETTE is at acceptable risk for the planned procedure without further cardiovascular testing.   The patient was advised that if he develops new symptoms prior to surgery to contact our office to arrange for a follow-up visit, and he verbalized understanding.  Per office protocol, if patient is without any new symptoms or concerns at the time of their virtual visit,   He  may hold ASA for 5-7 days  days prior to procedure. Please resume ASA as soon as possible postprocedure, at the discretion of the surgeon.    I will route this recommendation to the requesting party via Epic fax function and remove from pre-op pool.  Please call with questions.  Joni Reining, NP 05/18/2023, 3:50 PM

## 2023-06-21 ENCOUNTER — Other Ambulatory Visit: Payer: Self-pay | Admitting: Hematology & Oncology

## 2023-06-21 DIAGNOSIS — C8440 Peripheral T-cell lymphoma, not classified, unspecified site: Secondary | ICD-10-CM

## 2023-07-03 ENCOUNTER — Other Ambulatory Visit: Payer: Self-pay | Admitting: Pharmacist

## 2023-07-04 NOTE — Patient Instructions (Addendum)
SURGICAL WAITING ROOM VISITATION  Patients having surgery or a procedure may have no more than 2 support people in the waiting area - these visitors may rotate.    Children under the age of 91 must have an adult with them who is not the patient.  Due to an increase in RSV and influenza rates and associated hospitalizations, children ages 49 and under may not visit patients in [facility] hospitals.  If the patient needs to stay at the hospital during part of their recovery, the visitor guidelines for inpatient rooms apply. Pre-op nurse will coordinate an appropriate time for 1 support person to accompany patient in pre-op.  This support person may not rotate.    Please refer to the [facility] website for the visitor guidelines for Inpatients (after your surgery is over and you are in a regular room).    Your procedure is scheduled on: 07/17/23   Report to [facility] Main Entrance    Report to admitting at 6:10 AM   Call this number if you have problems the morning of surgery [phone]   Do not eat food :After Midnight.   After Midnight you may have the following liquids until 5:40 AM DAY OF SURGERY  Water Non-Citrus Juices (without pulp, NO RED-Apple, White grape, White cranberry) Black Coffee (NO MILK/CREAM OR CREAMERS, sugar ok)  Clear Tea (NO MILK/CREAM OR CREAMERS, sugar ok) regular and decaf                             Plain Jell-O (NO RED)                                           Fruit ices (not with fruit pulp, NO RED)                                     Popsicles (NO RED)                                                               Sports drinks like Gatorade (NO RED)                 The day of surgery:  Drink ONE (1) Pre-Surgery Clear Ensure at 5:40 AM the morning of surgery. Drink in one sitting. Do not sip.  This drink was given to you during your hospital  pre-op appointment visit. Nothing else to drink after completing the  Pre-Surgery G2.           If you have questions, please contact your surgeon's office.   FOLLOW BOWEL PREP AND ANY ADDITIONAL PRE OP INSTRUCTIONS YOU RECEIVED FROM YOUR SURGEON'S OFFICE!!!     Oral Hygiene is also important to reduce your risk of infection.                                    Remember - BRUSH YOUR TEETH THE MORNING OF SURGERY WITH YOUR REGULAR TOOTHPASTE  DENTURES WILL BE  REMOVED PRIOR TO SURGERY PLEASE DO NOT APPLY "Poly grip" OR ADHESIVES!!!   Stop all vitamins and herbal supplements 7 days before surgery.   Take these medicines the morning of surgery with A SIP OF WATER: Allopurinol, Carvedilol, Rosuvastatin   DO NOT TAKE ANY ORAL DIABETIC MEDICATIONS DAY OF YOUR SURGERY  How to Manage Your Diabetes Before and After Surgery  Why is it important to control my blood sugar before and after surgery? Improving blood sugar levels before and after surgery helps healing and can limit problems. A way of improving blood sugar control is eating a healthy diet by:  Eating less sugar and carbohydrates  Increasing activity/exercise  Talking with your doctor about reaching your blood sugar goals High blood sugars (greater than 180 mg/dL) can raise your risk of infections and slow your recovery, so you will need to focus on controlling your diabetes during the weeks before surgery. Make sure that the doctor who takes care of your diabetes knows about your planned surgery including the date and location.  How do I manage my blood sugar before surgery? Check your blood sugar at least 4 times a day, starting 2 days before surgery, to make sure that the level is not too high or low. Check your blood sugar the morning of your surgery when you wake up and every 2 hours until you get to the Short Stay unit. If your blood sugar is less than 70 mg/dL, you will need to treat for low blood sugar: Do not take insulin. Treat a low blood sugar (less than 70 mg/dL) with  cup of clear juice (cranberry or apple), 4  glucose tablets, OR glucose gel. Recheck blood sugar in 15 minutes after treatment (to make sure it is greater than 70 mg/dL). If your blood sugar is not greater than 70 mg/dL on recheck, call [phone] for further instructions. Report your blood sugar to the short stay nurse when you get to Short Stay.  If you are admitted to the hospital after surgery: Your blood sugar will be checked by the staff and you will probably be given insulin after surgery (instead of oral diabetes medicines) to make sure you have good blood sugar levels. The goal for blood sugar control after surgery is 80-180 mg/dL.   WHAT DO I DO ABOUT MY DIABETES MEDICATION?  Do not take oral diabetes medicines (pills) the morning of surgery.  THE DAY BEFORE SURGERY, take Metformin as prescribed..      THE MORNING OF SURGERY, do not take Metformin.   Reviewed and Endorsed by [facility] Patient Education Committee, August 2015                              You may not have any metal on your body including jewelry, and body piercing             Do not wear lotions, powders, cologne, or deodorant              Men may shave face and neck.   Do not bring valuables to the hospital. [facility] IS NOT             RESPONSIBLE   FOR VALUABLES.   Contacts, glasses, dentures or bridgework may not be worn into surgery.   Bring small overnight bag day of surgery.   DO NOT BRING YOUR HOME MEDICATIONS TO THE HOSPITAL. PHARMACY WILL DISPENSE MEDICATIONS LISTED ON YOUR MEDICATION LIST  TO YOU DURING YOUR ADMISSION IN THE HOSPITAL!   Special Instructions: Bring a copy of your healthcare power of attorney and living will documents the day of surgery if you haven't scanned them before.              Please read over the following fact sheets you were given: IF YOU HAVE QUESTIONS ABOUT YOUR PRE-OP INSTRUCTIONS PLEASE CALL [phone] [place]ras   If you received a COVID test during your pre-op visit  it is requested that you wear a  mask when out in public, stay away from anyone that may not be feeling well and notify your surgeon if you develop symptoms. If you test positive for Covid or have been in contact with anyone that has tested positive in the last 10 days please notify you surgeon.      Pre-operative 5 CHG Bath Instructions   You can play a key role in reducing the risk of infection after surgery. Your skin needs to be as free of germs as possible. You can reduce the number of germs on your skin by washing with CHG (chlorhexidine gluconate) soap before surgery. CHG is an antiseptic soap that kills germs and continues to kill germs even after washing.   DO NOT use if you have an allergy to chlorhexidine/CHG or antibacterial soaps. If your skin becomes reddened or irritated, stop using the CHG and notify one of our RNs at [phone].   Please shower with the CHG soap starting 4 days before surgery using the following schedule:     Please keep in mind the following:  DO NOT shave, including legs and underarms, starting the day of your first shower.   You may shave your face at any point before/day of surgery.  Place clean sheets on your bed the day you start using CHG soap. Use a clean washcloth (not used since being washed) for each shower. DO NOT sleep with pets once you start using the CHG.   CHG Shower Instructions:  If you choose to wash your hair and private area, wash first with your normal shampoo/soap.  After you use shampoo/soap, rinse your hair and body thoroughly to remove shampoo/soap residue.  Turn the water OFF and apply about 3 tablespoons (45 ml) of CHG soap to a CLEAN washcloth.  Apply CHG soap ONLY FROM YOUR NECK DOWN TO YOUR TOES (washing for 3-5 minutes)  DO NOT use CHG soap on face, private areas, open wounds, or sores.  Pay special attention to the area where your surgery is being performed.  If you are having back surgery, having someone wash your back for you may be helpful. Wait 2  minutes after CHG soap is applied, then you may rinse off the CHG soap.  Pat dry with a clean towel  Put on clean clothes/pajamas   If you choose to wear lotion, please use ONLY the CHG-compatible lotions on the back of this paper.     Additional instructions for the day of surgery: DO NOT APPLY any lotions, deodorants, cologne, or perfumes.   Put on clean/comfortable clothes.  Brush your teeth.  Ask your nurse before applying any prescription medications to the skin.      CHG Compatible Lotions   Aveeno Moisturizing lotion  Cetaphil Moisturizing Cream  Cetaphil Moisturizing Lotion  Clairol Herbal Essence Moisturizing Lotion, Dry Skin  Clairol Herbal Essence Moisturizing Lotion, Extra Dry Skin  Clairol Herbal Essence Moisturizing Lotion, Normal Skin  Curel Age Defying Therapeutic Moisturizing Lotion with Alpha  Hydroxy  Curel Extreme Care Body Lotion  Curel Soothing Hands Moisturizing Hand Lotion  Curel Therapeutic Moisturizing Cream, Fragrance-Free  Curel Therapeutic Moisturizing Lotion, Fragrance-Free  Curel Therapeutic Moisturizing Lotion, Original Formula  Eucerin Daily Replenishing Lotion  Eucerin Dry Skin Therapy Plus Alpha Hydroxy Crme  Eucerin Dry Skin Therapy Plus Alpha Hydroxy Lotion  Eucerin Original Crme  Eucerin Original Lotion  Eucerin Plus Crme Eucerin Plus Lotion  Eucerin TriLipid Replenishing Lotion  Keri Anti-Bacterial Hand Lotion  Keri Deep Conditioning Original Lotion Dry Skin Formula Softly Scented  Keri Deep Conditioning Original Lotion, Fragrance Free Sensitive Skin Formula  Keri Lotion Fast Absorbing Fragrance Free Sensitive Skin Formula  Keri Lotion Fast Absorbing Softly Scented Dry Skin Formula  Keri Original Lotion  Keri Skin Renewal Lotion Keri Silky Smooth Lotion  Keri Silky Smooth Sensitive Skin Lotion  Nivea Body Creamy Conditioning Oil  Nivea Body Extra Enriched Lotion  Nivea Body Original Lotion  Nivea Body Sheer Moisturizing Lotion  Nivea Crme  Nivea Skin Firming Lotion  NutraDerm 30 Skin Lotion  NutraDerm Skin Lotion  NutraDerm Therapeutic Skin Cream  NutraDerm Therapeutic Skin Lotion  ProShield Protective Hand Cream  Provon moisturizing lotion   Incentive Spirometer  An incentive spirometer is a tool that can help keep your lungs clear and active. This tool measures how well you are filling your lungs with each breath. Taking long deep breaths may help reverse or decrease the chance of developing breathing (pulmonary) problems (especially infection) following: A long period of time when you are unable to move or be active. BEFORE THE PROCEDURE  If the spirometer includes an indicator to show your best effort, your nurse or respiratory therapist will set it to a desired goal. If possible, sit up straight or lean slightly forward. Try not to slouch. Hold the incentive spirometer in an upright position. INSTRUCTIONS FOR USE  Sit on the edge of your bed if possible, or sit up as far as you can in bed or on a chair. Hold the incentive spirometer in an upright position. Breathe out normally. Place the mouthpiece in your mouth and seal your lips tightly around it. Breathe in slowly and as deeply as possible, raising the piston or the ball toward the top of the column. Hold your breath for 3-5 seconds or for as long as possible. Allow the piston or ball to fall to the bottom of the column. Remove the mouthpiece from your mouth and breathe out normally. Rest for a few seconds and repeat Steps 1 through 7 at least 10 times every 1-2 hours when you are awake. Take your time and take a few normal breaths between deep breaths. The spirometer may include an indicator to show your best effort. Use the indicator as a goal to work toward during each repetition. After each set of 10 deep breaths, practice coughing to be sure your lungs are clear. If you have an incision (the cut made at the time of surgery), support your incision  when coughing by placing a pillow or rolled up towels firmly against it. Once you are able to get out of bed, walk around indoors and cough well. You may stop using the incentive spirometer when instructed by your caregiver.  RISKS AND COMPLICATIONS Take your time so you do not get dizzy or light-headed. If you are in pain, you may need to take or ask for pain medication before doing incentive spirometry. It is harder to take a deep breath if you are having pain. AFTER  USE Rest and breathe slowly and easily. It can be helpful to keep track of a log of your progress. Your caregiver can provide you with a simple table to help with this. If you are using the spirometer at home, follow these instructions: SEEK MEDICAL CARE IF:  You are having difficultly using the spirometer. You have trouble using the spirometer as often as instructed. Your pain medication is not giving enough relief while using the spirometer. You develop fever of 100.5 F (38.1 C) or higher. SEEK IMMEDIATE MEDICAL CARE IF:  You cough up bloody sputum that had not been present before. You develop fever of 102 F (38.9 C) or greater. You develop worsening pain at or near the incision site. MAKE SURE YOU:  Understand these instructions. Will watch your condition. Will get help right away if you are not doing well or get worse. Document Released: 02/19/2007 Document Revised: 01/01/2012 Document Reviewed: 04/22/2007 Exit[employer] Patient Information 2014 [employer], [place].   ________________________________________________________________________ WHAT IS A BLOOD TRANSFUSION? Blood Transfusion Information  A transfusion is the replacement of blood or some of its parts. Blood is made up of multiple cells which provide different functions. Red blood cells carry oxygen and are used for blood loss replacement. White blood cells fight against infection. Platelets control bleeding. Plasma helps clot blood. Other blood products are  available for specialized needs, such as hemophilia or other clotting disorders. BEFORE THE TRANSFUSION  Who gives blood for transfusions?  Healthy volunteers who are fully evaluated to make sure their blood is safe. This is blood bank blood. Transfusion therapy is the safest it has ever been in the practice of medicine. Before blood is taken from a donor, a complete history is taken to make sure that person has no history of diseases nor engages in risky social behavior (examples are intravenous drug use or sexual activity with multiple partners). The donor's travel history is screened to minimize risk of transmitting infections, such as malaria. The donated blood is tested for signs of infectious diseases, such as HIV and hepatitis. The blood is then tested to be sure it is compatible with you in order to minimize the chance of a transfusion reaction. If you or a relative donates blood, this is often done in anticipation of surgery and is not appropriate for emergency situations. It takes many days to process the donated blood. RISKS AND COMPLICATIONS Although transfusion therapy is very safe and saves many lives, the main dangers of transfusion include:  Getting an infectious disease. Developing a transfusion reaction. This is an allergic reaction to something in the blood you were given. Every precaution is taken to prevent this. The decision to have a blood transfusion has been considered carefully by your caregiver before blood is given. Blood is not given unless the benefits outweigh the risks. AFTER THE TRANSFUSION Right after receiving a blood transfusion, you will usually feel much better and more energetic. This is especially true if your red blood cells have gotten low (anemic). The transfusion raises the level of the red blood cells which carry oxygen, and this usually causes an energy increase. The nurse administering the transfusion will monitor you carefully for complications. HOME CARE  INSTRUCTIONS  No special instructions are needed after a transfusion. You may find your energy is better. Speak with your caregiver about any limitations on activity for underlying diseases you may have. SEEK MEDICAL CARE IF:  Your condition is not improving after your transfusion. You develop redness or irritation at the intravenous (  IV) site. SEEK IMMEDIATE MEDICAL CARE IF:  Any of the following symptoms occur over the next 12 hours: Shaking chills. You have a temperature by mouth above 102 F (38.9 C), not controlled by medicine. Chest, back, or muscle pain. People around you feel you are not acting correctly or are confused. Shortness of breath or difficulty breathing. Dizziness and fainting. You get a rash or develop hives. You have a decrease in urine output. Your urine turns a dark color or changes to pink, red, or brown. Any of the following symptoms occur over the next 10 days: You have a temperature by mouth above 102 F (38.9 C), not controlled by medicine. Shortness of breath. Weakness after normal activity. The white part of the eye turns yellow (jaundice). You have a decrease in the amount of urine or are urinating less often. Your urine turns a dark color or changes to pink, red, or brown. Document Released: 10/06/2000 Document Revised: 01/01/2012 Document Reviewed: 05/25/2008 [facility] Patient Information 2014 [place], [place].  _______________________________________________________________________

## 2023-07-04 NOTE — Progress Notes (Signed)
COVID Vaccine Completed: yes  Date of COVID positive in last 90 days:  PCP - Willow Ora, MD Cardiologist - Laqueta Carina, MD Oncologist- Arlan Organ, MD  Cardiac clearance by Joni Reining , NP 05/18/23 in Epic/chart Oncology clearance by Arlan Organ, MD 04/04/23 in Epic  Chest x-ray -  EKG - 12/12/22 Epic Stress Test -  ECHO - 12/12/22 Epic Cardiac Cath - 11/11/19 Epic Pacemaker/ICD device last checked: Spinal Cord Stimulator:  Bowel Prep -   Sleep Study -  CPAP -   Fasting Blood Sugar -  Checks Blood Sugar _____ times a day  Last dose of GLP1 agonist-  N/A GLP1 instructions:  N/A   Last dose of SGLT-2 inhibitors-  N/A SGLT-2 instructions: N/A   Blood Thinner Instructions:  Time Aspirin Instructions: ASA 81, hold 5-7 days Last Dose:  Activity level:  Can go up a flight of stairs and perform activities of daily living without stopping and without symptoms of chest pain or shortness of breath.  Able to exercise without symptoms  Unable to go up a flight of stairs without symptoms of     Anesthesia review: HTN, PVCs, CHF, DM2, lymphoma   Patient denies shortness of breath, fever, cough and chest pain at PAT appointment  Patient verbalized understanding of instructions that were given to them at the PAT appointment. Patient was also instructed that they will need to review over the PAT instructions again at home before surgery.

## 2023-07-05 ENCOUNTER — Encounter (HOSPITAL_COMMUNITY): Payer: Self-pay

## 2023-07-05 ENCOUNTER — Other Ambulatory Visit: Payer: Self-pay

## 2023-07-05 ENCOUNTER — Encounter (HOSPITAL_COMMUNITY)
Admission: RE | Admit: 2023-07-05 | Discharge: 2023-07-05 | Disposition: A | Discharge status: Home / Self Care | Payer: PPO | Source: Ambulatory Visit | Attending: Orthopedic Surgery | Admitting: Orthopedic Surgery

## 2023-07-05 VITALS — BP 124/78 | HR 61 | Temp 98.4°F | Resp 14 | Ht 72.0 in | Wt 280.6 lb

## 2023-07-05 DIAGNOSIS — Z01812 Encounter for preprocedural laboratory examination: Secondary | ICD-10-CM | POA: Diagnosis not present

## 2023-07-05 DIAGNOSIS — M1612 Unilateral primary osteoarthritis, left hip: Secondary | ICD-10-CM | POA: Diagnosis not present

## 2023-07-05 DIAGNOSIS — Z01818 Encounter for other preprocedural examination: Secondary | ICD-10-CM

## 2023-07-05 DIAGNOSIS — E1169 Type 2 diabetes mellitus with other specified complication: Secondary | ICD-10-CM | POA: Diagnosis not present

## 2023-07-05 DIAGNOSIS — I5021 Acute systolic (congestive) heart failure: Secondary | ICD-10-CM

## 2023-07-05 HISTORY — DX: Non-Hodgkin lymphoma, unspecified, unspecified site: C85.90

## 2023-07-05 HISTORY — DX: Pneumonia, unspecified organism: J18.9

## 2023-07-05 LAB — BASIC METABOLIC PANEL
Anion gap: 9 (ref 5–15)
BUN: 13 mg/dL (ref 8–23)
CO2: 23 mmol/L (ref 22–32)
Calcium: 9 mg/dL (ref 8.9–10.3)
Chloride: 105 mmol/L (ref 98–111)
Creatinine, Ser: 1.09 mg/dL (ref 0.61–1.24)
GFR, Estimated: >60 mL/min (ref >60)
Glucose, Bld: 112 mg/dL — ABNORMAL HIGH (ref 70–99)
Potassium: 4.2 mmol/L (ref 3.5–5.1)
Sodium: 137 mmol/L (ref 135–145)

## 2023-07-05 LAB — TYPE AND SCREEN
ABO/RH(D): O POS
Antibody Screen: NEGATIVE

## 2023-07-05 LAB — SURGICAL PCR SCREEN
MRSA, PCR: NEGATIVE
Staphylococcus aureus: NEGATIVE

## 2023-07-05 LAB — CBC
HCT: 40.9 % (ref 39.0–52.0)
Hemoglobin: 13.3 g/dL (ref 13.0–17.0)
MCH: 30.4 pg (ref 26.0–34.0)
MCHC: 32.5 g/dL (ref 30.0–36.0)
MCV: 93.6 fL (ref 80.0–100.0)
Platelets: 219 10*3/uL (ref 150–400)
RBC: 4.37 MIL/uL (ref 4.22–5.81)
RDW: 12.9 % (ref 11.5–15.5)
WBC: 7.9 10*3/uL (ref 4.0–10.5)
nRBC: 0 % (ref 0.0–0.2)

## 2023-07-05 LAB — GLUCOSE, CAPILLARY: Glucose-Capillary: 127 mg/dL — ABNORMAL HIGH (ref 70–99)

## 2023-07-05 NOTE — Progress Notes (Signed)
   07/05/23 0856  OBSTRUCTIVE SLEEP APNEA  Have you ever been diagnosed with sleep apnea through a sleep study? No  Do you snore loudly (loud enough to be heard through closed doors)?  1  Do you often feel tired, fatigued, or sleepy during the daytime (such as falling asleep during driving or talking to someone)? 0  Has anyone observed you stop breathing during your sleep? 0  Do you have, or are you being treated for high blood pressure? 1  BMI more than 35 kg/m2? 1  Age > 50 (1-yes) 1  Neck circumference greater than:Male 16 inches or larger, Male 17inches or larger? 0  Male Gender (Yes=1) 1  Obstructive Sleep Apnea Score 5  Score 5 or greater  Results sent to PCP

## 2023-07-06 LAB — HEMOGLOBIN A1C
Hgb A1c MFr Bld: 6.1 % — ABNORMAL HIGH (ref 4.8–5.6)
Mean Plasma Glucose: 128 mg/dL

## 2023-07-09 DIAGNOSIS — L821 Other seborrheic keratosis: Secondary | ICD-10-CM | POA: Diagnosis not present

## 2023-07-09 DIAGNOSIS — L578 Other skin changes due to chronic exposure to nonionizing radiation: Secondary | ICD-10-CM | POA: Diagnosis not present

## 2023-07-09 DIAGNOSIS — L570 Actinic keratosis: Secondary | ICD-10-CM | POA: Diagnosis not present

## 2023-07-09 DIAGNOSIS — D2371 Other benign neoplasm of skin of right lower limb, including hip: Secondary | ICD-10-CM | POA: Diagnosis not present

## 2023-07-10 NOTE — Progress Notes (Signed)
Anesthesia Chart Review   Case: [id number] Date/Time: 07/17/23 0825   Procedure: TOTAL HIP ARTHROPLASTY ANTERIOR APPROACH (Left: Hip)   Anesthesia type: Spinal   Pre-op diagnosis: Left hip osteoarthritis   Location: [facility] ROOM 10 / [facility]   Surgeons: Durene Romans, MD       DISCUSSION:81 y.o. former smoker with h/o HTN, DM II, T-cell lymphoma in remission last seen by oncology 10/05/2023, left hip OA scheduled for above procedure 07/17/2023 with Dr. Durene Romans.   Per PCP note 05/17/2023:   Patient has normal kidney function, well-controlled diabetes.  Cleared from my side pending cardiology review     Per cardiology preoperative evaluation 05/18/2023, "Chart reviewed as part of pre-operative protocol coverage. Given past medical history and time since last visit, based on ACC/AHA guidelines, [name] [name] is at acceptable risk for the planned procedure without further cardiovascular testing.  The patient was advised that if he develops new symptoms prior to surgery to contact our office to arrange for a follow-up visit, and he verbalized understanding. Per office protocol, if patient is without any new symptoms or concerns at the time of their virtual visit,  He  may hold ASA for 5-7 days  days prior to procedure. Please resume ASA as soon as possible postprocedure, at the discretion of the surgeon."  VS: BP 124/78   Pulse 61   Temp 36.9 C (Oral)   Resp 14   Ht 6' (1.829 m)   Wt 127.3 kg   SpO2 99%   BMI 38.06 kg/m   PROVIDERS: Wanda Plump, MD is PCP   Cardiologist - Laqueta Carina, MD  Oncologist- Arlan Organ, MD LABS: Labs reviewed: Acceptable for surgery. (all labs ordered are listed, but only abnormal results are displayed)  Labs Reviewed  HEMOGLOBIN A1C - Abnormal; Notable for the following components:      Result Value   Hgb A1c MFr Bld 6.1 (*)    All other components within normal limits  BASIC METABOLIC PANEL - Abnormal; Notable for the following components:    Glucose, Bld 112 (*)    All other components within normal limits  GLUCOSE, CAPILLARY - Abnormal; Notable for the following components:   Glucose-Capillary 127 (*)    All other components within normal limits  SURGICAL PCR SCREEN  CBC  TYPE AND SCREEN     IMAGES:   EKG:   CV: Echo 12/12/2022  1. Left ventricular ejection fraction, by estimation, is 60 to 65%. The  left ventricle has normal function. The left ventricle has no regional  wall motion abnormalities. There is moderate concentric left ventricular  hypertrophy. Left ventricular  diastolic parameters are consistent with Grade I diastolic dysfunction  (impaired relaxation).   2. Right ventricular systolic function is normal. The right ventricular  size is mildly enlarged.   3. The mitral valve is normal in structure. Trivial mitral valve  regurgitation.   4. The aortic valve is tricuspid. There is mild calcification of the  aortic valve. There is mild thickening of the aortic valve. Aortic valve  regurgitation is mild. Aortic valve sclerosis/calcification is present,  without any evidence of aortic  stenosis.   5. Aortic dilatation noted. There is borderline dilatation of the  ascending aorta, measuring 39 mm. There is mild dilatation of the aortic  root, measuring 44 mm.   6. The inferior vena cava is normal in size with greater than 50%  respiratory variability, suggesting right atrial pressure of 3 mmHg.  Past Medical History:  Diagnosis Date   Arthritis    Complication of anesthesia     had spinal with knee surgeries  -"heart rate too low" for general   Detached retina, left    L eye normal vision, reports that he has had a retina procedure in a doctor's office at [facility]    Diabetes mellitus without complication (HCC)    Dysrhythmia    slight irregular rate   GERD (gastroesophageal reflux disease)    hx no problems now   H/O echocardiogram 2014   History of hiatal hernia    ?20 yrs ago   Hyperopia 2016    Hypertension    Lumbar stenosis with neurogenic claudication    Lymphoma (HCC)    MG, ocular (myasthenia gravis) (HCC)    ? of L eye,    Pneumonia    Prediabetes    Presbyopia OU 2016    Past Surgical History:  Procedure Laterality Date   COLONOSCOPY     HERNIA REPAIR     bilateral inguinal hernia   HERNIA REPAIR     umbilical   IR IMAGING GUIDED PORT INSERTION  09/16/2019   IR REMOVAL TUN ACCESS W/ PORT W/O FL MOD SED  03/04/2020   JOINT REPLACEMENT  2009   right knee   LUMBAR LAMINECTOMY/DECOMPRESSION MICRODISCECTOMY N/A 06/06/2017   Procedure: Decompression L3-5, insitu fusion L3-5 ;  Surgeon: Venita Lick, MD;  Location: [facility];  Service: Orthopedics;  Laterality: N/A;   LYMPH NODE BIOPSY Right 09/01/2019   Procedure: CERVICAL LYMPH NODE BIOPSY;  Surgeon: Flo Shanks, MD;  Location: [facility] OR;  Service: ENT;  Laterality: Right;   MIDDLE EAR SURGERY Right    ear -patched hole in ear drum   RIGHT/LEFT HEART CATH AND CORONARY ANGIOGRAPHY N/A 11/11/2019   Procedure: RIGHT/LEFT HEART CATH AND CORONARY ANGIOGRAPHY;  Surgeon: Dolores Patty, MD;  Location: [facility];  Service: Cardiovascular;  Laterality: N/A;   TOTAL KNEE ARTHROPLASTY  09/10/2012   Procedure: TOTAL KNEE ARTHROPLASTY;  Surgeon: Jacki Cones, MD;  Location: [facility];  Service: Orthopedics;  Laterality: Left;   TOTAL SHOULDER ARTHROPLASTY Right 06/22/2016   Procedure: RIGHT TOTAL SHOULDER ARTHROPLASTY;  Surgeon: Francena Hanly, MD;  Location: [facility];  Service: Orthopedics;  Laterality: Right;    MEDICATIONS:  allopurinol (ZYLOPRIM) 300 MG tablet   Ascorbic Acid (VITAMIN C PO)   aspirin 81 MG chewable tablet   Blood Glucose Monitoring Suppl (ONETOUCH VERIO FLEX SYSTEM) w/Device KIT   carvedilol (COREG) 12.5 MG tablet   Cyanocobalamin (B-12 PO)   furosemide (LASIX) 40 MG tablet   glucose blood (ONETOUCH VERIO) test strip   KLOR-CON M10 10 MEQ tablet   Lancets (ONETOUCH DELICA PLUS LANCET33G) MISC    losartan (COZAAR) 100 MG tablet   metFORMIN (GLUCOPHAGE) 500 MG tablet   Omega-3 Fatty Acids (FISH OIL) 1000 MG CAPS   rosuvastatin (CRESTOR) 10 MG tablet   spironolactone (ALDACTONE) 25 MG tablet   No current facility-administered medications for this encounter.     Jodell Cipro Ward, PA-C [facility] [phone]

## 2023-07-10 NOTE — Anesthesia Preprocedure Evaluation (Addendum)
Anesthesia Evaluation  Patient identified by MRN, date of birth, ID band Patient awake    Reviewed: Allergy & Precautions, H&P , NPO status , Patient's Chart, lab work & pertinent test results, reviewed documented beta blocker date and time   History of Anesthesia Complications (+) history of anesthetic complications  Airway Mallampati: III  TM Distance: >3 FB Neck ROM: Full   Comment: LARGE ANTERIOR CERVICAL LYMPH NODE MASS THAT EXTENDS BOTH POSTERIOR AND ANTERIOR TO ALMOST MIDLINE. QUESTIONABLE TRACHEAL DISPLACEMENT.   Dental  (+) Dental Advisory Given, Partial Upper, Missing, Poor Dentition, Chipped, Upper Dentures   Pulmonary pneumonia, former smoker   Pulmonary exam normal breath sounds clear to auscultation       Cardiovascular hypertension, Pt. on medications and Pt. on home beta blockers +CHF  Normal cardiovascular exam+ dysrhythmias  Rhythm:Regular Rate:Normal  Echo 11/2022  1. Left ventricular ejection fraction, by estimation, is 60 to 65%. The left ventricle has normal function. The left ventricle has no regional wall motion abnormalities. There is moderate concentric left ventricular hypertrophy. Left ventricular diastolic parameters are consistent with Grade I diastolic dysfunction (impaired relaxation).   2. Right ventricular systolic function is normal. The right ventricular size is mildly enlarged.   3. The mitral valve is normal in structure. Trivial mitral valve regurgitation.   4. The aortic valve is tricuspid. There is mild calcification of the aortic valve. There is mild thickening of the aortic valve. Aortic valve regurgitation is mild. Aortic valve sclerosis/calcification is present, without any evidence of aortic stenosis.   5. Aortic dilatation noted. There is borderline dilatation of the ascending aorta, measuring 39 mm. There is mild dilatation of the aortic root, measuring 44 mm.   6. The inferior vena cava is  normal in size with greater than 50% respiratory variability, suggesting right atrial pressure of 3 mmHg.   Comparison(s): No significant change from prior study.     Neuro/Psych Myasthenia Gravis  Neuromuscular disease    GI/Hepatic Neg liver ROS, hiatal hernia,GERD  Medicated,,  Endo/Other  diabetes  Morbid obesity  Renal/GU negative Renal ROS     Musculoskeletal  (+) Arthritis , Osteoarthritis,    Abdominal   Peds  Hematology  (+) Blood dyscrasia, anemia   Anesthesia Other Findings   Reproductive/Obstetrics                             Anesthesia Physical Anesthesia Plan  ASA: 3  Anesthesia Plan: General   Post-op Pain Management: Tylenol PO (pre-op)*   Induction: Intravenous  PONV Risk Score and Plan: 2 and Ondansetron, Dexamethasone and Treatment may vary due to age or medical condition  Airway Management Planned: Oral ETT  Additional Equipment:   Intra-op Plan:   Post-operative Plan: Extubation in OR  Informed Consent: I have reviewed the patients History and Physical, chart, labs and discussed the procedure including the risks, benefits and alternatives for the proposed anesthesia with the patient or authorized representative who has indicated his/her understanding and acceptance.     Dental advisory given  Plan Discussed with: CRNA  Anesthesia Plan Comments: (See PAT note 07/05/2023)        Anesthesia Quick Evaluation

## 2023-07-11 ENCOUNTER — Ambulatory Visit (INDEPENDENT_AMBULATORY_CARE_PROVIDER_SITE_OTHER): Payer: PPO | Admitting: Internal Medicine

## 2023-07-11 ENCOUNTER — Encounter: Payer: Self-pay | Admitting: Internal Medicine

## 2023-07-11 VITALS — BP 126/62 | HR 59 | Temp 97.7°F | Resp 18 | Ht 72.0 in | Wt 273.5 lb

## 2023-07-11 DIAGNOSIS — I5020 Unspecified systolic (congestive) heart failure: Secondary | ICD-10-CM

## 2023-07-11 DIAGNOSIS — Z7984 Long term (current) use of oral hypoglycemic drugs: Secondary | ICD-10-CM | POA: Diagnosis not present

## 2023-07-11 DIAGNOSIS — Z23 Encounter for immunization: Secondary | ICD-10-CM

## 2023-07-11 DIAGNOSIS — M159 Polyosteoarthritis, unspecified: Secondary | ICD-10-CM | POA: Diagnosis not present

## 2023-07-11 DIAGNOSIS — I152 Hypertension secondary to endocrine disorders: Secondary | ICD-10-CM

## 2023-07-11 DIAGNOSIS — E1159 Type 2 diabetes mellitus with other circulatory complications: Secondary | ICD-10-CM | POA: Diagnosis not present

## 2023-07-11 DIAGNOSIS — M1A9XX0 Chronic gout, unspecified, without tophus (tophi): Secondary | ICD-10-CM | POA: Diagnosis not present

## 2023-07-11 DIAGNOSIS — E785 Hyperlipidemia, unspecified: Secondary | ICD-10-CM

## 2023-07-11 DIAGNOSIS — E538 Deficiency of other specified B group vitamins: Secondary | ICD-10-CM

## 2023-07-11 LAB — LIPID PANEL
Cholesterol: 141 mg/dL (ref 0–200)
HDL: 39.3 mg/dL (ref >39.00)
LDL Cholesterol: 40 mg/dL (ref 0–99)
NonHDL: 101.24
Total CHOL/HDL Ratio: 4
Triglycerides: 305 mg/dL — ABNORMAL HIGH (ref 0.0–149.0)
VLDL: 61 mg/dL — ABNORMAL HIGH (ref 0.0–40.0)

## 2023-07-11 LAB — B12 AND FOLATE PANEL
Folate: 12.6 ng/mL (ref >5.9)
Vitamin B-12: 624 pg/mL (ref 211–911)

## 2023-07-11 LAB — URIC ACID: Uric Acid, Serum: 5 mg/dL (ref 4.0–7.8)

## 2023-07-11 NOTE — Progress Notes (Signed)
Subjective:    Patient ID: [name] [name], male    DOB: May 06, 1941, 82 y.o.   MRN: [id number]  DOS:  07/11/2023 Type of visit - description: f/u  Chronic medical problems addressed. To have a hip replacement in few days. Ambulatory BPs never more than 140. No recent ambulatory CBGs while he is taking care of his diet better, + weight loss.  Denies chest pain, palpitations.  No recent edema. Unable to walk much due to DJD issues but no DOE. No GI or GU symptoms.  Wt Readings from Last 3 Encounters:  07/11/23 273 lb 8 oz (124.1 kg)  07/05/23 280 lb 10.3 oz (127.3 kg)  04/04/23 281 lb 1.3 oz (127.5 kg)     Review of Systems See above   Past Medical History:  Diagnosis Date   Arthritis    Complication of anesthesia     had spinal with knee surgeries  -"heart rate too low" for general   Detached retina, left    L eye normal vision, reports that he has had a retina procedure in a doctor's office at [facility]    Diabetes mellitus without complication (HCC)    Dysrhythmia    slight irregular rate   GERD (gastroesophageal reflux disease)    hx no problems now   H/O echocardiogram 2014   History of hiatal hernia    ?20 yrs ago   Hyperopia 2016   Hypertension    Lumbar stenosis with neurogenic claudication    Lymphoma (HCC)    MG, ocular (myasthenia gravis) (HCC)    ? of L eye,    Pneumonia    Prediabetes    Presbyopia OU 2016    Past Surgical History:  Procedure Laterality Date   COLONOSCOPY     HERNIA REPAIR     bilateral inguinal hernia   HERNIA REPAIR     umbilical   IR IMAGING GUIDED PORT INSERTION  09/16/2019   IR REMOVAL TUN ACCESS W/ PORT W/O FL MOD SED  03/04/2020   JOINT REPLACEMENT  2009   right knee   LUMBAR LAMINECTOMY/DECOMPRESSION MICRODISCECTOMY N/A 06/06/2017   Procedure: Decompression L3-5, insitu fusion L3-5 ;  Surgeon: Venita Lick, MD;  Location: [facility];  Service: Orthopedics;  Laterality: N/A;   LYMPH NODE BIOPSY Right 09/01/2019   Procedure:  CERVICAL LYMPH NODE BIOPSY;  Surgeon: Flo Shanks, MD;  Location: [facility] OR;  Service: ENT;  Laterality: Right;   MIDDLE EAR SURGERY Right    ear -patched hole in ear drum   RIGHT/LEFT HEART CATH AND CORONARY ANGIOGRAPHY N/A 11/11/2019   Procedure: RIGHT/LEFT HEART CATH AND CORONARY ANGIOGRAPHY;  Surgeon: Dolores Patty, MD;  [facility];  Service: Cardiovascular;  Laterality: N/A;   TOTAL KNEE ARTHROPLASTY  09/10/2012   Procedure: TOTAL KNEE ARTHROPLASTY;  Surgeon: Jacki Cones, MD;  Location: [facility];  Service: Orthopedics;  Laterality: Left;   TOTAL SHOULDER ARTHROPLASTY Right 06/22/2016   Procedure: RIGHT TOTAL SHOULDER ARTHROPLASTY;  Surgeon: Francena Hanly, MD;  Location: [facility];  Service: Orthopedics;  Laterality: Right;    Current Outpatient Medications  Medication Instructions   allopurinol (ZYLOPRIM) 300 MG tablet TAKE 1 TABLET BY MOUTH EVERY DAY   Ascorbic Acid (VITAMIN C PO) 1 tablet, Oral, Daily   aspirin 81 mg, Oral, Daily   Blood Glucose Monitoring Suppl (ONETOUCH VERIO FLEX SYSTEM) w/Device KIT Use to check blood glucose once daily (DX: E11.9  type 2 diabetes)   carvedilol (COREG) 12.5 mg, Oral,  2 times daily   Cyanocobalamin (B-12 PO) 1 tablet, Oral, Daily   Fish Oil 1,000 mg, Oral, Daily   furosemide (LASIX) 40 mg, Oral, Daily PRN   glucose blood (ONETOUCH VERIO) test strip Use to check blood glucose once a day (Dx: 11.9 - type 2 DM)   KLOR-CON M10 10 MEQ tablet 10 mEq, Oral, Daily   Lancets (ONETOUCH DELICA PLUS LANCET33G) MISC Use to check blood glucose once a day (Dx: 11.9 type 2 DM   losartan (COZAAR) 100 mg, Oral, Daily   metFORMIN (GLUCOPHAGE) 500 mg, Oral, Daily with breakfast   rosuvastatin (CRESTOR) 10 MG tablet TAKE 1 TABLET BY MOUTH EVERY DAY   spironolactone (ALDACTONE) 12.5 mg, Oral, Daily       Objective:   Physical Exam BP 126/62   Pulse (!) 59   Temp 97.7 F (36.5 C) (Oral)   Resp 18   Ht 6' (1.829 m)   Wt 273 lb 8 oz (124.1  kg)   SpO2 97%   BMI 37.09 kg/m  General:   Well developed, NAD, BMI noted. HEENT:  Normocephalic . Face symmetric, atraumatic Lungs:  CTA B Normal respiratory effort, no intercostal retractions, no accessory muscle use. Heart: RRR,  no murmur.  Lower extremities: no pretibial edema bilaterally  Skin: Not pale. Not jaundice Neurologic:  alert & oriented X3.  Speech normal, gait assisted by a cane, transferring limited by MSK issues. Psych--  Cognition and judgment appear intact.  Cooperative with normal attention span and concentration.  Behavior appropriate. No anxious or depressed appearing.      Assessment     Assessment DM + Neuropathy Dx 01-2021 HTN Hyperlipidemia Gout  Morbid obesity Snoring  CV: -- Palpitations, PVCs, h/o bradycardia,s/p. Dr Langston Reusing day monitor, echo 2014: Normal LV, some LVH  --New onset acute syst CHF 10/2019>>  S/p catheterization 11/11/2019: No CAD: Normal EF. 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 HTN: BP is very good, ambulatory BPs never more than 140.  Last BMP okay.  Continue carvedilol, losartan, Aldactone, potassium.  Has Lasix, uses it prn, no recent edema. Dyslipidemia, last TG were elevated, he is on Crestor, diet has improved.  Check FLP. Neuropathy: Labs few months ago show low B12, recommended supplements. B12 deficiency: He is actually doing oral supplement.  Checking labs. DJD: Scheduled for a L hip arthroplasty in few days. OSA?  Screening presurgery was positive, Epworth scale today negative.  Observation. Cardiovascular: LOV with cardiology 12/12/2022, they felt that transient LV dysfunction was due to critical illness, next visit 08-2023. T-cell lymphoma stage IV: Saw hematology 03/2023, still in remission. Gout: No recent events, checking a uric acid,  continue allopurinol. Preventive care: Flu shot today. RTC 3 months

## 2023-07-11 NOTE — Assessment & Plan Note (Signed)
DM:A1c 6.1 few days ago, continue metformin. HTN: BP is very good, ambulatory BPs never more than 140.  Last BMP okay.  Continue carvedilol, losartan, Aldactone, potassium.  Has Lasix, uses it prn, no recent edema. Dyslipidemia, last TG were elevated, he is on Crestor, diet has improved.  Check FLP. Neuropathy: Labs few months ago show low B12, recommended supplements. B12 deficiency: He is actually doing oral supplement.  Checking labs. DJD: Scheduled for a L hip arthroplasty in few days. OSA?  Screening presurgery was positive, Epworth scale today negative.  Observation. Cardiovascular: LOV with cardiology 12/12/2022, they felt that transient LV dysfunction was due to critical illness, next visit 08-2023. T-cell lymphoma stage IV: Saw hematology 03/2023, still in remission. Gout: No recent events, checking a uric acid, continue allopurinol. Preventive care: Flu shot today. RTC 3 months

## 2023-07-11 NOTE — Patient Instructions (Addendum)
Vaccines I recommend: Covid booster- new this fall RSV vaccine  Check the  blood pressure regularly Blood pressure goal:  between 110/65 and  135/85. If it is consistently higher or lower, let me know     GO TO THE LAB : Get the blood work     Next visit with me in 3 to 4 months for a checkup;  please schedule it at the front desk

## 2023-07-16 NOTE — H&P (Signed)
TOTAL HIP ADMISSION H&P  Patient is admitted for left total hip arthroplasty.  Therapy Plans: HEP Disposition: Home with sister & has helpful neighbors Planned DVT Prophylaxis: aspirin 81mg  BID DME needed: none PCP: Dr. Drue Novel - clearance received Cardio: clearance received Oncologist >> took him off lasix TXA: IV Allergies: NKDA Anesthesia Concerns: Reports difficulty with meds keeping him asleep, hx of fusion from L3-5 in 2018 BMI: 39.4 Last HgbA1c: 6.1%   Other: - No hx of VTE or cancer - Hx of lymphoma - in remission - Wife is in a nursing home due to dementia - Plays fiddle/mandolin    Subjective:  Chief Complaint: left hip pain  HPI: [name] [name], 82 y.o. male, has a history of pain and functional disability in the left hip(s) due to arthritis and patient has failed non-surgical conservative treatments for greater than 12 weeks to include NSAID's and/or analgesics and activity modification.  Onset of symptoms was gradual starting 2 years ago with gradually worsening course since that time.The patient noted no past surgery on the left hip(s).  Patient currently rates pain in the left hip at 8 out of 10 with activity. Patient has worsening of pain with activity and weight bearing, pain that interfers with activities of daily living, and pain with passive range of motion. Patient has evidence of joint space narrowing by imaging studies. This condition presents safety issues increasing the risk of falls. There is no current active infection.  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   Stasis dermatitis of both legs 06/21/2019   Diabetes mellitus, type II (HCC) 07/09/2017   Status post lumbar surgery 06/06/2017   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/22/2016   PVC's (premature ventricular contractions) 11/24/2015   PCP notes >>>>>>>>>>>>>>> 06/23/2015   Morbid obesity (HCC) 02/24/2015   Bradycardia 11/19/2013   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   Palpitations 04/19/2013   Annual physical exam 12/07/2011   R calf larger than L, chronic, U/S (-) for DVT 11-2011 12/07/2011   Osteoarthritis 01/30/2008   Hyperlipidemia associated with type 2 diabetes mellitus (HCC) 12/19/2007   Hypertension associated with diabetes (HCC) 12/19/2007   ERECTILE DYSFUNCTION 01/28/2007   GERD 01/28/2007   Past Medical History:  Diagnosis Date   Arthritis    Complication of anesthesia     had spinal with knee surgeries  -"heart rate too low" for general   Detached retina, left    L eye normal vision, reports that he has had a retina procedure in a doctor's office at [facility]    Diabetes mellitus without complication (HCC)    Dysrhythmia    slight irregular rate   GERD (gastroesophageal reflux disease)    hx no problems now   H/O echocardiogram 2014   History of hiatal hernia    ?20 yrs ago   Hyperopia 2016   Hypertension    Lumbar stenosis with neurogenic claudication    Lymphoma (HCC)    MG, ocular (myasthenia gravis) (HCC)    ? of L eye,    Pneumonia    Prediabetes    Presbyopia OU 2016    Past  Surgical History:  Procedure Laterality Date   COLONOSCOPY     HERNIA REPAIR     bilateral inguinal hernia   HERNIA REPAIR     umbilical   IR IMAGING GUIDED PORT INSERTION  09/16/2019   IR REMOVAL TUN ACCESS W/ PORT W/O FL MOD SED  03/04/2020   JOINT REPLACEMENT  2009   right knee   LUMBAR LAMINECTOMY/DECOMPRESSION MICRODISCECTOMY N/A 06/06/2017   Procedure: Decompression L3-5, insitu fusion L3-5 ;  Surgeon: Venita Lick, MD;  Location: [facility] OR;  Service:  Orthopedics;  Laterality: N/A;   LYMPH NODE BIOPSY Right 09/01/2019   Procedure: CERVICAL LYMPH NODE BIOPSY;  Surgeon: Flo Shanks, MD;  Location: [facility] OR;  Service: ENT;  Laterality: Right;   MIDDLE EAR SURGERY Right    ear -patched hole in ear drum   RIGHT/LEFT HEART CATH AND CORONARY ANGIOGRAPHY N/A 11/11/2019   Procedure: RIGHT/LEFT HEART CATH AND CORONARY ANGIOGRAPHY;  Surgeon: Dolores Patty, MD;  Location: [facility] INVASIVE [facility] LAB;  Service: Cardiovascular;  Laterality: N/A;   TOTAL KNEE ARTHROPLASTY  09/10/2012   Procedure: TOTAL KNEE ARTHROPLASTY;  Surgeon: Jacki Cones, MD;  Location: [facility] ORS;  Service: Orthopedics;  Laterality: Left;   TOTAL SHOULDER ARTHROPLASTY Right 06/22/2016   Procedure: RIGHT TOTAL SHOULDER ARTHROPLASTY;  Surgeon: Francena Hanly, MD;  Location: [facility] OR;  Service: Orthopedics;  Laterality: Right;    No current facility-administered medications for this encounter.   Current Outpatient Medications  Medication Sig Dispense Refill Last Dose   allopurinol (ZYLOPRIM) 300 MG tablet TAKE 1 TABLET BY MOUTH EVERY DAY 90 tablet 1    Ascorbic Acid (VITAMIN C PO) Take 1 tablet by mouth daily.      aspirin 81 MG chewable tablet Chew 81 mg by mouth daily.      carvedilol (COREG) 12.5 MG tablet Take 1 tablet (12.5 mg total) by mouth 2 (two) times daily. 180 tablet 1    Cyanocobalamin (B-12 PO) Take 1 tablet by mouth daily. daily      losartan (COZAAR) 100 MG tablet Take 1 tablet (100 mg total) by mouth daily. 90 tablet 1    metFORMIN (GLUCOPHAGE) 500 MG tablet Take 1 tablet (500 mg total) by mouth daily with breakfast. 90 tablet 1    Omega-3 Fatty Acids (FISH OIL) 1000 MG CAPS Take 1,000 mg by mouth daily.      rosuvastatin (CRESTOR) 10 MG tablet TAKE 1 TABLET BY MOUTH EVERY DAY 90 tablet 3    spironolactone (ALDACTONE) 25 MG tablet TAKE 1/2 TABLET BY MOUTH EVERY DAY 45 tablet 7    Blood Glucose Monitoring Suppl (ONETOUCH VERIO FLEX SYSTEM) w/Device KIT Use to check  blood glucose once daily (DX: E11.9  type 2 diabetes) 1 kit 0    furosemide (LASIX) 40 MG tablet TAKE 1 TABLET (40 MG TOTAL) BY MOUTH DAILY AS NEEDED FOR FLUID OR EDEMA. (Patient not taking: Reported on 06/22/2023) 90 tablet 1 Not Taking   glucose blood (ONETOUCH VERIO) test strip Use to check blood glucose once a day (Dx: 11.9 - type 2 DM) 100 each 3    KLOR-CON M10 10 MEQ tablet TAKE 1 TABLET BY MOUTH EVERY DAY 90 tablet 1 Not Taking   Lancets (ONETOUCH DELICA PLUS LANCET33G) MISC Use to check blood glucose once a day (Dx: 11.9 type 2 DM 100 each 3    Allergies  Allergen Reactions   Doxycycline Rash    Social History   Tobacco Use   Smoking status: Former  Current packs/day: 0.00    Average packs/day: 0.5 packs/day for 2.0 years (1.0 ttl pk-yrs)    Types: Cigarettes    Start date: 06/09/1964    Quit date: 06/09/1966    Years since quitting: 57.1   Smokeless tobacco: Never  Substance Use Topics   Alcohol use: No    Alcohol/week: 0.0 standard drinks of alcohol    Comment: none    Family History  Problem Relation Age of Onset   Diabetes Brother    Colon cancer Brother 30       dx in 2018   Hypertension Sister    Prostate cancer Neg Hx    Stroke Neg Hx    CAD Neg Hx      Review of Systems  Constitutional:  Negative for chills and fever.  Respiratory:  Negative for cough and shortness of breath.   Cardiovascular:  Negative for chest pain.  Gastrointestinal:  Negative for nausea and vomiting.  Musculoskeletal:  Positive for arthralgias.    Objective:  Physical Exam Well nourished and well developed. General: Alert and oriented x3, cooperative and pleasant, no acute distress. Head: normocephalic, atraumatic, neck supple. Eyes: EOMI.  Musculoskeletal: Left hip exam: Painful and limited hip flexion internal rotation just beyond neutral with tightness with external rotation and reproducible pain in the anterior aspect the hip External rotation contracture with active  hip flexion noted 5 -/5 strength related to this pain  Calves soft and nontender. Motor function intact in LE. Strength 5/5 LE bilaterally. Neuro: Distal pulses 2+. Sensation to light touch intact in LE.  Vital signs in last 24 hours:    Labs:   Estimated body mass index is 37.09 kg/m as calculated from the following:   Height as of 07/11/23: 6' (1.829 m).   Weight as of 07/11/23: 124.1 kg.   Imaging Review Plain radiographs demonstrate severe degenerative joint disease of the left hip(s). The bone quality appears to be adequate for age and reported activity level.      Assessment/Plan:  End stage arthritis, left hip(s)  The patient history, physical examination, clinical judgement of the provider and imaging studies are consistent with end stage degenerative joint disease of the left hip(s) and total hip arthroplasty is deemed medically necessary. The treatment options including medical management, injection therapy, arthroscopy and arthroplasty were discussed at length. The risks and benefits of total hip arthroplasty were presented and reviewed. The risks due to aseptic loosening, infection, stiffness, dislocation/subluxation,  thromboembolic complications and other imponderables were discussed.  The patient acknowledged the explanation, agreed to proceed with the plan and consent was signed. Patient is being admitted for inpatient treatment for surgery, pain control, PT, OT, prophylactic antibiotics, VTE prophylaxis, progressive ambulation and ADL's and discharge planning.The patient is planning to be discharged  home.   Rosalene Billings, PA-C Orthopedic Surgery [facility] [place] [phone]

## 2023-07-17 ENCOUNTER — Ambulatory Visit (HOSPITAL_COMMUNITY): Payer: PPO | Admitting: Physician Assistant

## 2023-07-17 ENCOUNTER — Ambulatory Visit (HOSPITAL_BASED_OUTPATIENT_CLINIC_OR_DEPARTMENT_OTHER): Payer: Self-pay | Admitting: Certified Registered Nurse Anesthetist

## 2023-07-17 ENCOUNTER — Observation Stay (HOSPITAL_COMMUNITY)
Admission: RE | Admit: 2023-07-17 | Discharge: 2023-07-18 | Disposition: A | Discharge status: Home / Self Care | Payer: PPO | Source: Ambulatory Visit | Attending: Orthopedic Surgery | Admitting: Orthopedic Surgery

## 2023-07-17 ENCOUNTER — Other Ambulatory Visit: Payer: Self-pay

## 2023-07-17 ENCOUNTER — Ambulatory Visit (HOSPITAL_COMMUNITY): Payer: PPO

## 2023-07-17 ENCOUNTER — Encounter (HOSPITAL_COMMUNITY)
Admission: RE | Disposition: A | Discharge status: Home / Self Care | Payer: Self-pay | Source: Ambulatory Visit | Attending: Orthopedic Surgery

## 2023-07-17 ENCOUNTER — Encounter (HOSPITAL_COMMUNITY): Payer: Self-pay | Admitting: Orthopedic Surgery

## 2023-07-17 ENCOUNTER — Observation Stay (HOSPITAL_COMMUNITY): Payer: PPO

## 2023-07-17 ENCOUNTER — Ambulatory Visit: Payer: PPO | Admitting: Podiatry

## 2023-07-17 DIAGNOSIS — Z87891 Personal history of nicotine dependence: Secondary | ICD-10-CM | POA: Diagnosis not present

## 2023-07-17 DIAGNOSIS — I5021 Acute systolic (congestive) heart failure: Secondary | ICD-10-CM | POA: Diagnosis not present

## 2023-07-17 DIAGNOSIS — Z96611 Presence of right artificial shoulder joint: Secondary | ICD-10-CM | POA: Diagnosis not present

## 2023-07-17 DIAGNOSIS — I509 Heart failure, unspecified: Secondary | ICD-10-CM | POA: Diagnosis not present

## 2023-07-17 DIAGNOSIS — E119 Type 2 diabetes mellitus without complications: Secondary | ICD-10-CM

## 2023-07-17 DIAGNOSIS — Z96653 Presence of artificial knee joint, bilateral: Secondary | ICD-10-CM | POA: Insufficient documentation

## 2023-07-17 DIAGNOSIS — I110 Hypertensive heart disease with heart failure: Secondary | ICD-10-CM | POA: Insufficient documentation

## 2023-07-17 DIAGNOSIS — Z85828 Personal history of other malignant neoplasm of skin: Secondary | ICD-10-CM | POA: Diagnosis not present

## 2023-07-17 DIAGNOSIS — Z96642 Presence of left artificial hip joint: Secondary | ICD-10-CM

## 2023-07-17 DIAGNOSIS — Z7984 Long term (current) use of oral hypoglycemic drugs: Secondary | ICD-10-CM | POA: Insufficient documentation

## 2023-07-17 DIAGNOSIS — M1612 Unilateral primary osteoarthritis, left hip: Secondary | ICD-10-CM | POA: Diagnosis not present

## 2023-07-17 DIAGNOSIS — Z79899 Other long term (current) drug therapy: Secondary | ICD-10-CM | POA: Diagnosis not present

## 2023-07-17 DIAGNOSIS — Z7982 Long term (current) use of aspirin: Secondary | ICD-10-CM | POA: Diagnosis not present

## 2023-07-17 DIAGNOSIS — E1169 Type 2 diabetes mellitus with other specified complication: Secondary | ICD-10-CM

## 2023-07-17 HISTORY — PX: TOTAL HIP ARTHROPLASTY: SHX124

## 2023-07-17 LAB — GLUCOSE, CAPILLARY
Glucose-Capillary: 110 mg/dL — ABNORMAL HIGH (ref 70–99)
Glucose-Capillary: 139 mg/dL — ABNORMAL HIGH (ref 70–99)
Glucose-Capillary: 174 mg/dL — ABNORMAL HIGH (ref 70–99)
Glucose-Capillary: 163 mg/dL — ABNORMAL HIGH (ref 70–99)

## 2023-07-17 SURGERY — ARTHROPLASTY, HIP, TOTAL, ANTERIOR APPROACH
Anesthesia: General | Site: Hip | Laterality: Left

## 2023-07-17 MED ORDER — SPIRONOLACTONE 12.5 MG HALF TABLET
12.5000 mg | ORAL_TABLET | Freq: Every day | ORAL | Status: DC
  Administered 2023-07-18: 12.5 mg via ORAL
  Filled 2023-07-17: qty 1

## 2023-07-17 MED ORDER — CHLORHEXIDINE GLUCONATE 0.12 % MT SOLN
15.0000 mL | Freq: Once | OROMUCOSAL | Status: AC
  Administered 2023-07-17: 15 mL via OROMUCOSAL

## 2023-07-17 MED ORDER — CEFAZOLIN SODIUM-DEXTROSE 2-4 GM/100ML-% IV SOLN
2.0000 g | Freq: Four times a day (QID) | INTRAVENOUS | Status: AC
  Administered 2023-07-17 (×2): 2 g via INTRAVENOUS
  Filled 2023-07-17: qty 100

## 2023-07-17 MED ORDER — SENNA 8.6 MG PO TABS
2.0000 | ORAL_TABLET | Freq: Every day | ORAL | Status: DC
  Administered 2023-07-17: 17.2 mg via ORAL
  Filled 2023-07-17: qty 2

## 2023-07-17 MED ORDER — STERILE WATER FOR IRRIGATION IR SOLN
Status: DC | PRN
  Administered 2023-07-17: 2000 mL

## 2023-07-17 MED ORDER — METHOCARBAMOL 500 MG IVPB - SIMPLE MED: 500.0000 mg | Freq: Four times a day (QID) | INTRAVENOUS | Status: DC | PRN

## 2023-07-17 MED ORDER — METOCLOPRAMIDE HCL 5 MG PO TABS: 5.0000 mg | ORAL_TABLET | Freq: Three times a day (TID) | ORAL | Status: DC | PRN

## 2023-07-17 MED ORDER — FENTANYL CITRATE (PF) 100 MCG/2ML IJ SOLN
INTRAMUSCULAR | Status: DC | PRN
  Administered 2023-07-17 (×2): 50 ug via INTRAVENOUS
  Administered 2023-07-17 (×2): 100 ug via INTRAVENOUS
  Administered 2023-07-17 (×2): 50 ug via INTRAVENOUS

## 2023-07-17 MED ORDER — TRANEXAMIC ACID-NACL 1000-0.7 MG/100ML-% IV SOLN
1000.0000 mg | INTRAVENOUS | Status: AC
  Administered 2023-07-17: 1000 mg via INTRAVENOUS
  Filled 2023-07-17: qty 100

## 2023-07-17 MED ORDER — DEXAMETHASONE SODIUM PHOSPHATE 10 MG/ML IJ SOLN
INTRAMUSCULAR | Status: AC
  Filled 2023-07-17: qty 1

## 2023-07-17 MED ORDER — POLYETHYLENE GLYCOL 3350 17 G PO PACK
17.0000 g | PACK | Freq: Two times a day (BID) | ORAL | Status: DC
  Administered 2023-07-17 – 2023-07-18 (×2): 17 g via ORAL
  Filled 2023-07-17 (×2): qty 1

## 2023-07-17 MED ORDER — ONDANSETRON HCL 4 MG/2ML IJ SOLN: 4.0000 mg | Freq: Four times a day (QID) | INTRAMUSCULAR | Status: DC | PRN

## 2023-07-17 MED ORDER — INSULIN ASPART 100 UNIT/ML IJ SOLN: 0.0000 [IU] | INTRAMUSCULAR | Status: DC | PRN

## 2023-07-17 MED ORDER — MENTHOL 3 MG MT LOZG: 1.0000 | LOZENGE | OROMUCOSAL | Status: DC | PRN

## 2023-07-17 MED ORDER — ONDANSETRON HCL 4 MG/2ML IJ SOLN
INTRAMUSCULAR | Status: DC | PRN
  Administered 2023-07-17: 4 mg via INTRAVENOUS

## 2023-07-17 MED ORDER — ONDANSETRON HCL 4 MG/2ML IJ SOLN
INTRAMUSCULAR | Status: AC
  Filled 2023-07-17: qty 2

## 2023-07-17 MED ORDER — FENTANYL CITRATE (PF) 100 MCG/2ML IJ SOLN
INTRAMUSCULAR | Status: AC
  Filled 2023-07-17: qty 2

## 2023-07-17 MED ORDER — OXYCODONE HCL 5 MG PO TABS: 5.0000 mg | ORAL_TABLET | Freq: Once | ORAL | Status: DC | PRN

## 2023-07-17 MED ORDER — POTASSIUM CHLORIDE CRYS ER 10 MEQ PO TBCR: 10.0000 meq | EXTENDED_RELEASE_TABLET | Freq: Every day | ORAL | Status: DC

## 2023-07-17 MED ORDER — SODIUM CHLORIDE (PF) 0.9 % IJ SOLN
INTRAMUSCULAR | Status: AC
  Filled 2023-07-17: qty 50

## 2023-07-17 MED ORDER — INSULIN ASPART 100 UNIT/ML IJ SOLN
0.0000 [IU] | Freq: Three times a day (TID) | INTRAMUSCULAR | Status: DC
  Administered 2023-07-17: 3 [IU] via SUBCUTANEOUS
  Administered 2023-07-18: 2 [IU] via SUBCUTANEOUS

## 2023-07-17 MED ORDER — ROCURONIUM BROMIDE 10 MG/ML (PF) SYRINGE
PREFILLED_SYRINGE | INTRAVENOUS | Status: AC
  Filled 2023-07-17: qty 10

## 2023-07-17 MED ORDER — PROPOFOL 10 MG/ML IV BOLUS
INTRAVENOUS | Status: DC | PRN
  Administered 2023-07-17: 150 mg via INTRAVENOUS

## 2023-07-17 MED ORDER — ROSUVASTATIN CALCIUM 10 MG PO TABS
10.0000 mg | ORAL_TABLET | Freq: Every day | ORAL | Status: DC
  Administered 2023-07-18: 10 mg via ORAL
  Filled 2023-07-17: qty 1

## 2023-07-17 MED ORDER — LIDOCAINE 2% (20 MG/ML) 5 ML SYRINGE
INTRAMUSCULAR | Status: DC | PRN
  Administered 2023-07-17: 100 mg via INTRAVENOUS

## 2023-07-17 MED ORDER — ACETAMINOPHEN 500 MG PO TABS
ORAL_TABLET | ORAL | Status: AC
  Administered 2023-07-17: 1000 mg via ORAL
  Filled 2023-07-17: qty 2

## 2023-07-17 MED ORDER — METHOCARBAMOL 500 MG IVPB - SIMPLE MED
INTRAVENOUS | Status: AC
  Administered 2023-07-17: 500 mg via INTRAVENOUS
  Filled 2023-07-17: qty 55

## 2023-07-17 MED ORDER — ACETAMINOPHEN 325 MG PO TABS
ORAL_TABLET | ORAL | Status: DC | PRN
  Administered 2023-07-17: 1000 mg via ORAL

## 2023-07-17 MED ORDER — ACETAMINOPHEN 500 MG PO TABS
1000.0000 mg | ORAL_TABLET | Freq: Four times a day (QID) | ORAL | Status: DC
  Administered 2023-07-17 – 2023-07-18 (×3): 1000 mg via ORAL
  Filled 2023-07-17 (×3): qty 2

## 2023-07-17 MED ORDER — ALUM & MAG HYDROXIDE-SIMETH 200-200-20 MG/5ML PO SUSP: 30.0000 mL | ORAL | Status: DC | PRN

## 2023-07-17 MED ORDER — ONDANSETRON HCL 4 MG PO TABS: 4.0000 mg | ORAL_TABLET | Freq: Four times a day (QID) | ORAL | Status: DC | PRN

## 2023-07-17 MED ORDER — HYDROMORPHONE HCL 1 MG/ML IJ SOLN
INTRAMUSCULAR | Status: AC
  Administered 2023-07-17: 0.5 mg via INTRAVENOUS
  Filled 2023-07-17: qty 1

## 2023-07-17 MED ORDER — LOSARTAN POTASSIUM 50 MG PO TABS
100.0000 mg | ORAL_TABLET | Freq: Every day | ORAL | Status: DC
  Administered 2023-07-18: 100 mg via ORAL
  Filled 2023-07-17: qty 2

## 2023-07-17 MED ORDER — LIDOCAINE HCL (PF) 2 % IJ SOLN
INTRAMUSCULAR | Status: AC
  Filled 2023-07-17: qty 5

## 2023-07-17 MED ORDER — ACETAMINOPHEN 500 MG PO TABS: 1000.0000 mg | ORAL_TABLET | Freq: Four times a day (QID) | ORAL | Status: DC

## 2023-07-17 MED ORDER — KETOROLAC TROMETHAMINE 30 MG/ML IJ SOLN
INTRAMUSCULAR | Status: AC
  Filled 2023-07-17: qty 1

## 2023-07-17 MED ORDER — DEXAMETHASONE SODIUM PHOSPHATE 4 MG/ML IJ SOLN
INTRAMUSCULAR | Status: DC | PRN
  Administered 2023-07-17: 5 mg via INTRAVENOUS

## 2023-07-17 MED ORDER — DROPERIDOL 2.5 MG/ML IJ SOLN: 0.6250 mg | Freq: Once | INTRAMUSCULAR | Status: DC | PRN

## 2023-07-17 MED ORDER — EPHEDRINE SULFATE-NACL 50-0.9 MG/10ML-% IV SOSY
PREFILLED_SYRINGE | INTRAVENOUS | Status: DC | PRN
  Administered 2023-07-17: 10 mg via INTRAVENOUS
  Administered 2023-07-17 (×3): 5 mg via INTRAVENOUS

## 2023-07-17 MED ORDER — ACETAMINOPHEN 500 MG PO TABS
ORAL_TABLET | ORAL | Status: AC
  Filled 2023-07-17: qty 2

## 2023-07-17 MED ORDER — OXYCODONE HCL 5 MG PO TABS: 5.0000 mg | ORAL_TABLET | ORAL | Status: DC | PRN

## 2023-07-17 MED ORDER — CEFAZOLIN SODIUM-DEXTROSE 2-4 GM/100ML-% IV SOLN
INTRAVENOUS | Status: AC
  Filled 2023-07-17: qty 100

## 2023-07-17 MED ORDER — POLYETHYLENE GLYCOL 3350 17 G PO PACK: 17.0000 g | PACK | Freq: Two times a day (BID) | ORAL | Status: DC

## 2023-07-17 MED ORDER — DIPHENHYDRAMINE HCL 12.5 MG/5ML PO ELIX: 12.5000 mg | ORAL_SOLUTION | ORAL | Status: DC | PRN

## 2023-07-17 MED ORDER — ORAL CARE MOUTH RINSE: 15.0000 mL | Freq: Once | OROMUCOSAL | Status: AC

## 2023-07-17 MED ORDER — POVIDONE-IODINE 10 % EX SWAB: 2.0000 | Freq: Once | CUTANEOUS | Status: DC

## 2023-07-17 MED ORDER — ALLOPURINOL 300 MG PO TABS
300.0000 mg | ORAL_TABLET | Freq: Every day | ORAL | Status: DC
  Administered 2023-07-18: 300 mg via ORAL
  Filled 2023-07-17: qty 1

## 2023-07-17 MED ORDER — BUPIVACAINE-EPINEPHRINE 0.25% -1:200000 IJ SOLN
INTRAMUSCULAR | Status: AC
  Filled 2023-07-17: qty 1

## 2023-07-17 MED ORDER — TRANEXAMIC ACID-NACL 1000-0.7 MG/100ML-% IV SOLN
1000.0000 mg | Freq: Once | INTRAVENOUS | Status: AC
  Administered 2023-07-17: 1000 mg via INTRAVENOUS
  Filled 2023-07-17: qty 100

## 2023-07-17 MED ORDER — SODIUM CHLORIDE (PF) 0.9 % IJ SOLN
INTRAMUSCULAR | Status: DC | PRN
  Administered 2023-07-17: 61 mL

## 2023-07-17 MED ORDER — HYDROMORPHONE HCL 1 MG/ML IJ SOLN
0.2500 mg | INTRAMUSCULAR | Status: DC | PRN
  Administered 2023-07-17 (×3): 0.5 mg via INTRAVENOUS

## 2023-07-17 MED ORDER — PROPOFOL 10 MG/ML IV BOLUS
INTRAVENOUS | Status: AC
  Filled 2023-07-17: qty 20

## 2023-07-17 MED ORDER — CEFAZOLIN IN SODIUM CHLORIDE 3-0.9 GM/100ML-% IV SOLN
3.0000 g | INTRAVENOUS | Status: AC
  Administered 2023-07-17: 3 g via INTRAVENOUS
  Filled 2023-07-17: qty 100

## 2023-07-17 MED ORDER — 0.9 % SODIUM CHLORIDE (POUR BTL) OPTIME
TOPICAL | Status: DC | PRN
  Administered 2023-07-17: 1000 mL

## 2023-07-17 MED ORDER — CARVEDILOL 12.5 MG PO TABS
12.5000 mg | ORAL_TABLET | Freq: Two times a day (BID) | ORAL | Status: DC
  Administered 2023-07-17 – 2023-07-18 (×2): 12.5 mg via ORAL
  Filled 2023-07-17 (×2): qty 1

## 2023-07-17 MED ORDER — PHENOL 1.4 % MT LIQD: 1.0000 | OROMUCOSAL | Status: DC | PRN

## 2023-07-17 MED ORDER — SUGAMMADEX SODIUM 200 MG/2ML IV SOLN
INTRAVENOUS | Status: DC | PRN
  Administered 2023-07-17: 200 mg via INTRAVENOUS

## 2023-07-17 MED ORDER — METOCLOPRAMIDE HCL 5 MG/ML IJ SOLN: 5.0000 mg | Freq: Three times a day (TID) | INTRAMUSCULAR | Status: DC | PRN

## 2023-07-17 MED ORDER — METFORMIN HCL 500 MG PO TABS
500.0000 mg | ORAL_TABLET | Freq: Every day | ORAL | Status: DC
  Administered 2023-07-18: 500 mg via ORAL
  Filled 2023-07-17: qty 1

## 2023-07-17 MED ORDER — METHOCARBAMOL 500 MG PO TABS: 500.0000 mg | ORAL_TABLET | Freq: Four times a day (QID) | ORAL | Status: DC | PRN

## 2023-07-17 MED ORDER — LACTATED RINGERS IV SOLN: INTRAVENOUS | Status: DC

## 2023-07-17 MED ORDER — BISACODYL 10 MG RE SUPP: 10.0000 mg | Freq: Every day | RECTAL | Status: DC | PRN

## 2023-07-17 MED ORDER — HYDROMORPHONE HCL 1 MG/ML IJ SOLN
INTRAMUSCULAR | Status: AC
  Filled 2023-07-17: qty 1

## 2023-07-17 MED ORDER — HYDROMORPHONE HCL 1 MG/ML IJ SOLN: 0.5000 mg | INTRAMUSCULAR | Status: DC | PRN

## 2023-07-17 MED ORDER — SODIUM CHLORIDE 0.9 % IV SOLN: INTRAVENOUS | Status: DC

## 2023-07-17 MED ORDER — OXYCODONE HCL 5 MG/5ML PO SOLN: 5.0000 mg | Freq: Once | ORAL | Status: DC | PRN

## 2023-07-17 MED ORDER — ROCURONIUM BROMIDE 100 MG/10ML IV SOLN
INTRAVENOUS | Status: DC | PRN
Start: 2023-07-17 — End: 2023-07-17
  Administered 2023-07-17: 70 mg via INTRAVENOUS

## 2023-07-17 MED ORDER — ASPIRIN 81 MG PO CHEW
81.0000 mg | CHEWABLE_TABLET | Freq: Two times a day (BID) | ORAL | Status: DC
  Administered 2023-07-17 – 2023-07-18 (×2): 81 mg via ORAL
  Filled 2023-07-17 (×2): qty 1

## 2023-07-17 MED ORDER — OXYCODONE HCL 5 MG PO TABS: 10.0000 mg | ORAL_TABLET | ORAL | Status: DC | PRN

## 2023-07-17 SURGICAL SUPPLY — 43 items
ADH SKN CLS APL DERMABOND .7 (GAUZE/BANDAGES/DRESSINGS) ×1
ARTICULEZE HEAD (Hips) ×1 IMPLANT
BAG COUNTER SPONGE SURGICOUNT (BAG) IMPLANT
BAG SPEC THK2 15X12 ZIP CLS (MISCELLANEOUS)
BAG SPNG CNTER NS LX DISP (BAG)
BAG ZIPLOCK 12X15 (MISCELLANEOUS) IMPLANT
BLADE SAG 18X100X1.27 (BLADE) ×1 IMPLANT
COVER PERINEAL POST (MISCELLANEOUS) ×1 IMPLANT
COVER SURGICAL LIGHT HANDLE (MISCELLANEOUS) ×1 IMPLANT
CUP ACET PINNACLE SECTR 58MM (Hips) IMPLANT
DERMABOND ADVANCED .7 DNX12 (GAUZE/BANDAGES/DRESSINGS) ×1 IMPLANT
DRAPE FOOT SWITCH (DRAPES) ×1 IMPLANT
DRAPE STERI IOBAN 125X83 (DRAPES) ×1 IMPLANT
DRAPE U-SHAPE 47X51 STRL (DRAPES) ×2 IMPLANT
DRESSING AQUACEL AG SP 3.5X10 (GAUZE/BANDAGES/DRESSINGS) ×1 IMPLANT
DRSG AQUACEL AG SP 3.5X10 (GAUZE/BANDAGES/DRESSINGS) ×1
DURAPREP 26ML APPLICATOR (WOUND CARE) ×1 IMPLANT
ELECT REM PT RETURN 15FT ADLT (MISCELLANEOUS) ×1 IMPLANT
GLOVE BIO SURGEON STRL SZ 6 (GLOVE) ×1 IMPLANT
GLOVE BIOGEL PI IND STRL 6.5 (GLOVE) ×1 IMPLANT
GLOVE BIOGEL PI IND STRL 7.5 (GLOVE) ×1 IMPLANT
GLOVE ORTHO TXT STRL SZ7.5 (GLOVE) ×2 IMPLANT
GOWN STRL REUS W/ TWL LRG LVL3 (GOWN DISPOSABLE) ×2 IMPLANT
GOWN STRL REUS W/TWL LRG LVL3 (GOWN DISPOSABLE) ×2
HEAD ARTICULEZE (Hips) IMPLANT
HOLDER FOLEY CATH W/STRAP (MISCELLANEOUS) ×1 IMPLANT
KIT TURNOVER KIT A (KITS) IMPLANT
LINER NEUTRAL 36X58 PLUS4 IMPLANT
NDL SAFETY ECLIP 18X1.5 (MISCELLANEOUS) IMPLANT
PACK ANTERIOR HIP CUSTOM (KITS) ×1 IMPLANT
PINNACLE SECTOR CUP 58MM (Hips) ×1 IMPLANT
SCREW 6.5MMX30MM (Screw) IMPLANT
STEM FEM ACTIS HIGH SZ8 (Stem) IMPLANT
SUT MNCRL AB 4-0 PS2 18 (SUTURE) ×1 IMPLANT
SUT STRATAFIX 0 PDS 27 VIOLET (SUTURE) ×1
SUT VIC AB 1 CT1 36 (SUTURE) ×3 IMPLANT
SUT VIC AB 2-0 CT1 27 (SUTURE) ×2
SUT VIC AB 2-0 CT1 TAPERPNT 27 (SUTURE) ×2 IMPLANT
SUTURE STRATFX 0 PDS 27 VIOLET (SUTURE) ×1 IMPLANT
SYR 3ML LL SCALE MARK (SYRINGE) IMPLANT
TRAY FOLEY MTR SLVR 16FR STAT (SET/KITS/TRAYS/PACK) IMPLANT
TUBE SUCTION HIGH CAP CLEAR NV (SUCTIONS) ×1 IMPLANT
WATER STERILE IRR 1000ML POUR (IV SOLUTION) ×1 IMPLANT

## 2023-07-17 NOTE — Discharge Instructions (Signed)

## 2023-07-17 NOTE — Anesthesia Procedure Notes (Signed)
Procedure Name: Intubation Date/Time: 07/17/2023 8:45 AM  Performed by: Vanessa Bellingham, CRNAPre-anesthesia Checklist: Emergency Drugs available, Suction available, Patient identified and Patient being monitored Patient Re-evaluated:Patient Re-evaluated prior to induction Oxygen Delivery Method: Circle system utilized Preoxygenation: Pre-oxygenation with 100% oxygen Induction Type: IV induction Ventilation: Mask ventilation without difficulty Laryngoscope Size: Glidescope and 4 Grade View: Grade I Tube type: Oral Tube size: 7.5 mm Number of attempts: 1 Airway Equipment and Method: Video-laryngoscopy Placement Confirmation: ETT inserted through vocal cords under direct vision, positive ETCO2 and breath sounds checked- equal and bilateral Secured at: 21 cm Tube secured with: Tape Dental Injury: Teeth and Oropharynx as per pre-operative assessment

## 2023-07-17 NOTE — Progress Notes (Signed)
PT Note  Patient Details Name: [name] [name] MRN: [id number] DOB: 05/15/1941   Cancelled Treatment:    Reason Eval/Treat Not Completed: PT arrived and pt semi reclined in bed friends assisting pt with dinner set up. PT provided ed and adjusted bed for more upright position. PT to return this evening if schedule allows. Pt is aware and reports 1/10 L hip pain at rest. PT to continue to follow acutely.   [name], PT Acute Rehab   Jacqualyn Posey 07/17/2023, 5:47 PM

## 2023-07-17 NOTE — Plan of Care (Signed)
  Problem: Fluid Volume: Goal: Ability to maintain a balanced intake and output will improve Outcome: Progressing   Problem: Education: Goal: Knowledge of the prescribed therapeutic regimen will improve Outcome: Progressing Goal: Understanding of discharge needs will improve Outcome: Progressing Goal: Individualized Educational Video(s) Outcome: Progressing

## 2023-07-17 NOTE — Op Note (Signed)
NAME:  KENYA [name]                ACCOUNT NO.: [id number]      MEDICAL RECORD NO.: [id number]      FACILITY:  [facility]      PHYSICIAN:  Shelda Pal  DATE OF BIRTH:  29-Mar-1941     DATE OF PROCEDURE:  07/17/2023                                 OPERATIVE REPORT         PREOPERATIVE DIAGNOSIS: Left  hip osteoarthritis.      POSTOPERATIVE DIAGNOSIS:  Left hip osteoarthritis.      PROCEDURE:  Left total hip replacement through an anterior approach   utilizing DePuy THR system, component size 58 mm pinnacle cup, a size 36+4 neutral   Altrex liner, a size 8 Hi Actis stem with a 36+5 Articuleze metal head ball.      SURGEON:  Madlyn Frankel. Charlann Boxer, M.D.      ASSISTANT:  Rosalene Billings, PA-C     ANESTHESIA:  General.      SPECIMENS:  None.      COMPLICATIONS:  None.      BLOOD LOSS:  500 cc     DRAINS:  None.      INDICATION OF THE PROCEDURE:  [name] [name] is a 82 y.o. male who had   presented to office for evaluation of left hip pain.  Radiographs revealed   progressive degenerative changes with bone-on-bone   articulation of the  hip joint, including subchondral cystic changes and osteophytes.  The patient had painful limited range of   motion significantly affecting their overall quality of life and function.  The patient was failing to    respond to conservative measures including medications and/or injections and activity modification and at this point was ready   to proceed with more definitive measures.  Consent was obtained for   benefit of pain relief.  Specific risks of infection, DVT, component   failure, dislocation, neurovascular injury, and need for revision surgery were reviewed in the office.     PROCEDURE IN DETAIL:  The patient was brought to operative theater.   Once adequate anesthesia, preoperative antibiotics, 2 gm of Ancef, 1 gm of Tranexamic Acid, and 10 mg of Decadron were administered, the patient was positioned supine on the [employer] table.  Once the patient was safely positioned with adequate padding of boney prominences we predraped out the hip, and used fluoroscopy to confirm orientation of the pelvis.      The left hip was then prepped and draped from proximal iliac crest to   mid thigh with a shower curtain technique.      Time-out was performed identifying the patient, planned procedure, and the appropriate extremity.     An incision was then made 2 cm lateral to the   anterior superior iliac spine extending over the orientation of the   tensor fascia lata muscle and sharp dissection was carried down to the   fascia of the muscle.      The fascia was then incised.  The muscle belly was identified and swept   laterally and retractor placed along the superior neck.  Following   cauterization of the circumflex vessels and removing some pericapsular   fat, a second cobra retractor was placed on the inferior neck.  A T-capsulotomy was made along the line of the   superior neck to the trochanteric fossa, then extended proximally and   distally.  Tag sutures were placed and the retractors were then placed   intracapsular.  We then identified the trochanteric fossa and   orientation of my neck cut and then made a neck osteotomy with the femur on traction.  The femoral   head was removed without difficulty or complication.  Traction was let   off and retractors were placed posterior and anterior around the   acetabulum.      The labrum and foveal tissue were debrided.  I began reaming with a 52 mm   reamer and reamed up to 57 mm reamer with good bony bed preparation and a 58 mm  cup was chosen.  The final 58 mm Pinnacle cup was then impacted under fluoroscopy to confirm the depth of penetration and orientation with respect to   Abduction and forward flexion.  A screw was placed into the ilium followed by the hole eliminator.  The final   36+4 neutral Altrex liner was impacted with good visualized rim fit.  The cup  was positioned anatomically within the acetabular portion of the pelvis.      At this point, the femur was rolled to 100 degrees.  Further capsule was   released off the inferior aspect of the femoral neck.  I then   released the superior capsule proximally.  With the leg in a neutral position the hook was placed laterally   along the femur under the vastus lateralis origin and elevated manually and then held in position using the hook attachment on the bed.  The leg was then extended and adducted with the leg rolled to 100   degrees of external rotation.  Retractors were placed along the medial calcar and posteriorly over the greater trochanter.  Once the proximal femur was fully   exposed, I used a box osteotome to set orientation.  I then began   broaching with the starting chili pepper broach and passed this by hand and then broached up to 8.  With the 8 broach in place I chose a high offset neck and did several trial reductions.  The offset was appropriate, leg lengths   appeared to be equal best matched with the +5 head ball trial confirmed radiographically.   Given these findings, I went ahead and dislocated the hip, repositioned all   retractors and positioned the right hip in the extended and abducted position.  The final 8 Hi Actis stem was   chosen and it was impacted down to the level of neck cut.  Based on this   and the trial reductions, a final 36+5 Articuleze metal head ball was chosen and   impacted onto a clean and dry trunnion, and the hip was reduced.  The   hip had been irrigated throughout the case again at this point.  I did   reapproximate the superior capsular leaflet to the anterior leaflet   using #1 Vicryl.  The fascia of the   tensor fascia lata muscle was then reapproximated using #1 Vicryl and #0 Stratafix sutures.  The   remaining wound was closed with 2-0 Vicryl and running 4-0 Monocryl.   The hip was cleaned, dried, and dressed sterilely using Dermabond and    Aquacel dressing.  The patient was then brought   to recovery room in stable condition tolerating the procedure well.    Rosalene Billings,  PA-C was present for the entirety of the case involved from   preoperative positioning, perioperative retractor management, general   facilitation of the case, as well as primary wound closure as assistant.            Madlyn Frankel Charlann Boxer, M.D.        07/17/2023 8:49 AM

## 2023-07-17 NOTE — Transfer of Care (Signed)
Immediate Anesthesia Transfer of Care Note  Patient: [name] [name]  Procedure(s) Performed: TOTAL HIP ARTHROPLASTY ANTERIOR APPROACH (Left: Hip)  Patient Location: PACU  Anesthesia Type:General  Level of Consciousness: awake and patient cooperative  Airway & Oxygen Therapy: Patient Spontanous Breathing and Patient connected to face mask  Post-op Assessment: Report given to RN and Post -op Vital signs reviewed and stable  Post vital signs: Reviewed and stable  Last Vitals:  Vitals Value Taken Time  BP 156/70 07/17/23 1023  Temp    Pulse 56 07/17/23 1028  Resp 10 07/17/23 1024  SpO2 98 % 07/17/23 1028  Vitals shown include unfiled device data.  Last Pain:  Vitals:   07/17/23 0717  TempSrc: Oral         Complications: No notable events documented.

## 2023-07-17 NOTE — Plan of Care (Signed)
Problem: Education: Goal: Ability to describe self-care measures that may prevent or decrease complications (Diabetes Survival Skills Education) will improve Outcome: Progressing Goal: Individualized Educational Video(s) Outcome: Progressing   Problem: Coping: Goal: Ability to adjust to condition or change in health will improve Outcome: Progressing   Problem: Fluid Volume: Goal: Ability to maintain a balanced intake and output will improve Outcome: Progressing   Problem: Health Behavior/Discharge Planning: Goal: Ability to identify and utilize available resources and services will improve Outcome: Progressing Goal: Ability to manage health-related needs will improve Outcome: Progressing   Problem: Metabolic: Goal: Ability to maintain appropriate glucose levels will improve Outcome: Progressing   Problem: Nutritional: Goal: Maintenance of adequate nutrition will improve Outcome: Progressing Goal: Progress toward achieving an optimal weight will improve Outcome: Progressing   Problem: Skin Integrity: Goal: Risk for impaired skin integrity will decrease Outcome: Progressing   Problem: Tissue Perfusion: Goal: Adequacy of tissue perfusion will improve Outcome: Progressing   Problem: Education: Goal: Knowledge of the prescribed therapeutic regimen will improve Outcome: Progressing Goal: Understanding of discharge needs will improve Outcome: Progressing Goal: Individualized Educational Video(s) Outcome: Progressing   Problem: Activity: Goal: Ability to avoid complications of mobility impairment will improve Outcome: Progressing Goal: Ability to tolerate increased activity will improve Outcome: Progressing   Problem: Clinical Measurements: Goal: Postoperative complications will be avoided or minimized Outcome: Progressing   Problem: Pain Management: Goal: Pain level will decrease with appropriate interventions Outcome: Progressing   Problem: Skin  Integrity: Goal: Will show signs of wound healing Outcome: Progressing   Problem: Education: Goal: Knowledge of General Education information will improve Description: Including pain rating scale, medication(s)/side effects and non-pharmacologic comfort measures Outcome: Progressing   Problem: Health Behavior/Discharge Planning: Goal: Ability to manage health-related needs will improve Outcome: Progressing   Problem: Clinical Measurements: Goal: Ability to maintain clinical measurements within normal limits will improve Outcome: Progressing Goal: Will remain free from infection Outcome: Progressing Goal: Diagnostic test results will improve Outcome: Progressing Goal: Respiratory complications will improve Outcome: Progressing Goal: Cardiovascular complication will be avoided Outcome: Progressing   Problem: Activity: Goal: Risk for activity intolerance will decrease Outcome: Progressing   Problem: Nutrition: Goal: Adequate nutrition will be maintained Outcome: Progressing   Problem: Coping: Goal: Level of anxiety will decrease Outcome: Progressing   Problem: Elimination: Goal: Will not experience complications related to bowel motility Outcome: Progressing Goal: Will not experience complications related to urinary retention Outcome: Progressing   Problem: Pain Managment: Goal: General experience of comfort will improve Outcome: Progressing   Problem: Safety: Goal: Ability to remain free from injury will improve Outcome: Progressing   Problem: Skin Integrity: Goal: Risk for impaired skin integrity will decrease Outcome: Progressing   Problem: Education: Goal: Knowledge of the prescribed therapeutic regimen will improve Outcome: Progressing Goal: Understanding of discharge needs will improve Outcome: Progressing Goal: Individualized Educational Video(s) Outcome: Progressing   Problem: Activity: Goal: Ability to avoid complications of mobility impairment  will improve Outcome: Progressing Goal: Ability to tolerate increased activity will improve Outcome: Progressing   Problem: Clinical Measurements: Goal: Postoperative complications will be avoided or minimized Outcome: Progressing   Problem: Pain Management: Goal: Pain level will decrease with appropriate interventions Outcome: Progressing   Problem: Skin Integrity: Goal: Will show signs of wound healing Outcome: Progressing   Problem: Education: Goal: Knowledge of General Education information will improve Description: Including pain rating scale, medication(s)/side effects and non-pharmacologic comfort measures Outcome: Progressing   Problem: Health Behavior/Discharge Planning: Goal: Ability to manage health-related needs will improve  Outcome: Progressing   Problem: Clinical Measurements: Goal: Ability to maintain clinical measurements within normal limits will improve Outcome: Progressing Goal: Will remain free from infection Outcome: Progressing Goal: Diagnostic test results will improve Outcome: Progressing Goal: Respiratory complications will improve Outcome: Progressing Goal: Cardiovascular complication will be avoided Outcome: Progressing

## 2023-07-17 NOTE — Anesthesia Postprocedure Evaluation (Signed)
Anesthesia Post Note  Patient: [name]  Procedure(s) Performed: TOTAL HIP ARTHROPLASTY ANTERIOR APPROACH (Left: Hip)     Patient location during evaluation: PACU Anesthesia Type: General Level of consciousness: sedated and patient cooperative Pain management: pain level controlled Vital Signs Assessment: post-procedure vital signs reviewed and stable Respiratory status: spontaneous breathing Cardiovascular status: stable Anesthetic complications: no   No notable events documented.  Last Vitals:  Vitals:   07/17/23 1545 07/17/23 1600  BP: (!) 144/65 126/68  Pulse: (!) 59 (!) 43  Resp: (!) 23 11  Temp:    SpO2: 98% 100%    Last Pain:  Vitals:   07/17/23 1600  TempSrc:   PainSc: 4     LLE Motor Response: Purposeful movement (07/17/23 1600)   RLE Motor Response: Purposeful movement (07/17/23 1600)        Lewie Loron

## 2023-07-17 NOTE — Care Plan (Signed)
Ortho Bundle Case Management Note  Patient Details  Name: [name] [name] MRN: [id number] Date of Birth: 1941/04/16                  L THA on 07/17/23.  DCP: Home with sister who will be staying with him for 2 days, daughter who lives nearby will be checking on him, and also neighbor that is a nurse will be checking on him.  DME: No needs. Has RW and cane.  PT: HEP   DME Arranged:  N/A DME Agency:       Additional Comments: Please contact me with any questions of if this plan should need to change.    Despina Pole, Case Manager  [facility]  [phone] 07/17/2023, 9:00 AM

## 2023-07-18 ENCOUNTER — Encounter (HOSPITAL_COMMUNITY): Payer: Self-pay | Admitting: Orthopedic Surgery

## 2023-07-18 DIAGNOSIS — M1612 Unilateral primary osteoarthritis, left hip: Secondary | ICD-10-CM | POA: Diagnosis not present

## 2023-07-18 LAB — BASIC METABOLIC PANEL
Anion gap: 12 (ref 5–15)
BUN: 19 mg/dL (ref 8–23)
CO2: 22 mmol/L (ref 22–32)
Calcium: 8.1 mg/dL — ABNORMAL LOW (ref 8.9–10.3)
Chloride: 101 mmol/L (ref 98–111)
Creatinine, Ser: 1.14 mg/dL (ref 0.61–1.24)
GFR, Estimated: >60 mL/min (ref >60)
Glucose, Bld: 148 mg/dL — ABNORMAL HIGH (ref 70–99)
Potassium: 4.1 mmol/L (ref 3.5–5.1)
Sodium: 135 mmol/L (ref 135–145)

## 2023-07-18 LAB — CBC
HCT: 36.5 % — ABNORMAL LOW (ref 39.0–52.0)
Hemoglobin: 12 g/dL — ABNORMAL LOW (ref 13.0–17.0)
MCH: 31.6 pg (ref 26.0–34.0)
MCHC: 32.9 g/dL (ref 30.0–36.0)
MCV: 96.1 fL (ref 80.0–100.0)
Platelets: 188 10*3/uL (ref 150–400)
RBC: 3.8 MIL/uL — ABNORMAL LOW (ref 4.22–5.81)
RDW: 12.8 % (ref 11.5–15.5)
WBC: 14.4 10*3/uL — ABNORMAL HIGH (ref 4.0–10.5)
nRBC: 0 % (ref 0.0–0.2)

## 2023-07-18 LAB — GLUCOSE, CAPILLARY: Glucose-Capillary: 135 mg/dL — ABNORMAL HIGH (ref 70–99)

## 2023-07-18 MED ORDER — SENNA 8.6 MG PO TABS: 2.0000 | ORAL_TABLET | Freq: Every day | ORAL | 0 refills | Status: AC

## 2023-07-18 MED ORDER — POLYETHYLENE GLYCOL 3350 17 G PO PACK: 17.0000 g | PACK | Freq: Two times a day (BID) | ORAL | 0 refills | Status: DC

## 2023-07-18 MED ORDER — CELECOXIB 200 MG PO CAPS
200.0000 mg | ORAL_CAPSULE | Freq: Every day | ORAL | Status: DC
  Administered 2023-07-18: 200 mg via ORAL
  Filled 2023-07-18: qty 1

## 2023-07-18 MED ORDER — CELECOXIB 200 MG PO CAPS: 200.0000 mg | ORAL_CAPSULE | Freq: Every day | ORAL | 0 refills | Status: DC

## 2023-07-18 MED ORDER — OXYCODONE HCL 5 MG PO TABS: 5.0000 mg | ORAL_TABLET | ORAL | 0 refills | Status: DC | PRN

## 2023-07-18 MED ORDER — METHOCARBAMOL 500 MG PO TABS: 500.0000 mg | ORAL_TABLET | Freq: Four times a day (QID) | ORAL | 2 refills | Status: DC | PRN

## 2023-07-18 MED ORDER — ASPIRIN 81 MG PO CHEW: 81.0000 mg | CHEWABLE_TABLET | Freq: Two times a day (BID) | ORAL | 0 refills | Status: AC

## 2023-07-18 NOTE — Evaluation (Signed)
Physical Therapy Evaluation Patient Details Name: [name] [name] MRN: [id number] DOB: [birth year] Today's Date: 07/18/2023  History of Present Illness  Pt Is 82 yo male admitted on 07/17/23 for L anterior THA.  Pt with hx including but not limited to gout, CHF, lymphoma, DM2, lumbar sx, shoulder replacement, bil TKA, OA, HTN  Clinical Impression  Pt is s/p THA resulting in the deficits listed below (see PT Problem List). At baseline, pt is independent and lives alone.  He does have family that will be staying with him at discharge and has necessary DME. Today, pt very motivated and with excellent pain control.  He demonstrated transfers safely and easily with use of RW and AAROM techniques with gait belt.  Pt ambulating 120' and performed stairs similar to home setup without difficulty.  Pt educated on HEP and with good understanding of HEP and progression.  Pt demonstrates safe gait & transfers in order to return home from PT perspective once discharged by MD.  While in hospital, will continue to benefit from PT for skilled therapy to advance mobility and exercises.         If plan is discharge home, recommend the following: A little help with walking and/or transfers;A little help with bathing/dressing/bathroom;Assistance with cooking/housework;Help with stairs or ramp for entrance   Can travel by private vehicle        Equipment Recommendations None recommended by PT  Recommendations for Other Services       Functional Status Assessment Patient has had a recent decline in their functional status and demonstrates the ability to make significant improvements in function in a reasonable and predictable amount of time.     Precautions / Restrictions Restrictions Weight Bearing Restrictions: No      Mobility  Bed Mobility Overal bed mobility: Needs Assistance Bed Mobility: Supine to Sit     Supine to sit: Contact guard     General bed mobility comments: utilized gait belt to assist  L LE    Transfers Overall transfer level: Needs assistance Equipment used: Rolling walker (2 wheels) Transfers: Sit to/from Stand Sit to Stand: Supervision           General transfer comment: Good hand placment without cues; stood from bed and recliner    Ambulation/Gait Ambulation/Gait assistance: Supervision Gait Distance (Feet): 120 Feet Assistive device: Rolling walker (2 wheels) Gait Pattern/deviations: Step-through pattern Gait velocity: decreased but functional     General Gait Details: Slight decrease in weight shift to R; tendency to lean forward -required cues for posture and RW proximity; Steady gait without LOB  Stairs Stairs: Yes Stairs assistance: Contact guard assist Stair Management: Two rails, Step to pattern, Forwards Number of Stairs: 5 General stair comments: Pt aware of correct sequencing witout cues; demonstrated safely  Wheelchair Mobility     Tilt Bed    Modified Rankin (Stroke Patients Only)       Balance Overall balance assessment: Needs assistance Sitting-balance support: No upper extremity supported Sitting balance-Leahy Scale: Normal     Standing balance support: Bilateral upper extremity supported, No upper extremity supported Standing balance-Leahy Scale: Good Standing balance comment: RW to ambulate but could stand without UE support                             Pertinent Vitals/Pain Pain Assessment Pain Assessment: 0-10 Pain Score: 1  Pain Location: L hip Pain Descriptors / Indicators: Sore Pain Intervention(s): Limited activity within patient's tolerance,  Monitored during session, Repositioned (had tylenol but denies need for further meds)    Home Living Family/patient expects to be discharged to:: Private residence Living Arrangements: Alone Available Help at Discharge: Family;Available 24 hours/day (daughter and sister to stay for a few days; wife at [facility]) Type of Home: House Home Access: Stairs to  enter Entrance Stairs-Rails: Right;Left;Can reach both Entrance Stairs-Number of Steps: 3   Home Layout: One level Home Equipment: Educational psychologist (2 wheels);Cane - single point;Grab bars - tub/shower      Prior Function Prior Level of Function : Independent/Modified Independent;Driving             Mobility Comments: Pt could ambulate in community, was recently using cane ADLs Comments: Pt independent adls and iadls     Extremity/Trunk Assessment   Upper Extremity Assessment Upper Extremity Assessment: Overall WFL for tasks assessed    Lower Extremity Assessment Lower Extremity Assessment: LLE deficits/detail LLE Deficits / Details: Expected post op changes; ROM WFL; MMT: ankle 5/5, knee 3/5 not further tested, hip flexion 2/5, hip abd 2/5    Cervical / Trunk Assessment Cervical / Trunk Assessment: Normal  Communication      Cognition Arousal: Alert Behavior During Therapy: WFL for tasks assessed/performed Overall Cognitive Status: Within Functional Limits for tasks assessed                                          General Comments  Educated on safe ice use, no pivots, car transfers. Also, encouraged walking every 1-2 hours during day. Educated on HEP with focus on mobility the first weeks. Discussed doing exercises within pain control and if pain increasing could decreased ROM, reps, and stop exercises as needed. Encouraged to perform ankle pumps frequently for blood flow     Exercises Total Joint Exercises Ankle Circles/Pumps: AROM, Both, 10 reps, Supine Quad Sets: AROM, 10 reps, Both, Supine Heel Slides: AAROM, Left, 10 reps, Supine Hip ABduction/ADduction: AAROM, Left, 10 reps, Supine Long Arc Quad: AROM, Left, 10 reps, Seated Other Exercises Other Exercises: AAROM with education on gait belt use for exercises as needed Other Exercises: Performed standing, L, AROM, 5 reps, with supervision, holding counter: marching, hip abd, hip  ext, hamstring curl   Assessment/Plan    PT Assessment Patient needs continued PT services  PT Problem List Decreased strength;Pain;Decreased range of motion;Decreased activity tolerance;Decreased balance;Decreased mobility;Decreased knowledge of use of DME       PT Treatment Interventions DME instruction;Therapeutic exercise;Gait training;Balance training;Stair training;Functional mobility training;Therapeutic activities;Patient/family education;Modalities    PT Goals (Current goals can be found in the Care Plan section)  Acute Rehab PT Goals Patient Stated Goal: return home PT Goal Formulation: With patient Time For Goal Achievement: 08/01/23 Potential to Achieve Goals: Good    Frequency 7X/week     Co-evaluation               AM-PAC PT "6 Clicks" Mobility  Outcome Measure Help needed turning from your back to your side while in a flat bed without using bedrails?: A Little Help needed moving from lying on your back to sitting on the side of a flat bed without using bedrails?: A Little Help needed moving to and from a bed to a chair (including a wheelchair)?: A Little Help needed standing up from a chair using your arms (e.g., wheelchair or bedside chair)?: A Little Help needed to walk  in hospital room?: A Little Help needed climbing 3-5 steps with a railing? : A Little 6 Click Score: 18    End of Session Equipment Utilized During Treatment: Gait belt Activity Tolerance: Patient tolerated treatment well Patient left: with chair alarm set;in chair;with call bell/phone within reach Nurse Communication: Mobility status PT Visit Diagnosis: Other abnormalities of gait and mobility (R26.89);Muscle weakness (generalized) (M62.81)    Time: 1001-1035 PT Time Calculation (min) (ACUTE ONLY): 34 min   Charges:   PT Evaluation $PT Eval Low Complexity: 1 Low PT Treatments $Gait Training: 8-22 mins PT General Charges $$ ACUTE PT VISIT: 1 Visit         Anise Salvo, PT Acute  Rehab [facility] [phone]   Rayetta Humphrey 07/18/2023, 11:21 AM

## 2023-07-18 NOTE — Care Management Obs Status (Signed)
MEDICARE OBSERVATION STATUS NOTIFICATION   Patient Details  Name: DOANE PLUDE MRN: [id number] Date of Birth: Dec 18, 1940   Medicare Observation Status Notification Given:  Hart Robinsons, LCSW 07/18/2023, 9:57 AM

## 2023-07-18 NOTE — TOC Transition Note (Signed)
Transition of Care [facility]) - CM/SW Discharge Note   Patient Details  Name: [name] [name] MRN: [id number] Date of Birth: 1941/01/06  [facility] CM/SW Contact:  Amada Jupiter, LCSW Phone Number: 07/18/2023, 9:52 AM   Clinical Narrative:     Met with pt who confirms he has needed DME in the home.  Plan for HEP.  No TOC needs.  Final next level of care: Home/Self Care Barriers to Discharge: No Barriers Identified   Patient Goals and CMS Choice      Discharge Placement                         Discharge Plan and Services Additional resources added to the After Visit Summary for                  DME Arranged: N/A                    Social Determinants of Health (SDOH) Interventions SDOH Screenings   Food Insecurity: No Food Insecurity (07/17/2023)  Housing: Patient Declined (07/17/2023)  Transportation Needs: No Transportation Needs (07/17/2023)  Utilities: Not At Risk (07/17/2023)  Alcohol Screen: Low Risk  (03/21/2023)  Depression (PHQ2-9): Low Risk  (07/11/2023)  Financial Resource Strain: Low Risk  (03/21/2023)  Physical Activity: Insufficiently Active (03/10/2022)  Social Connections: Socially Integrated (11/08/2022)  Stress: No Stress Concern Present (03/10/2022)  Tobacco Use: Medium Risk (07/17/2023)     Readmission Risk Interventions     No data to display

## 2023-07-18 NOTE — Progress Notes (Signed)
   Subjective: 1 Day Post-Op Procedure(s) (LRB): TOTAL HIP ARTHROPLASTY ANTERIOR APPROACH (Left) Patient reports pain as mild.   Patient seen in rounds with Dr. Charlann Boxer. Patient is resting in bed on exam this morning. No acute events overnight. Voiding without difficulty. Patient has not been up with PT yet, but states he walked to the bathroom this morning.  We will start therapy today.   Objective: Vital signs in last 24 hours: Temp:  [97.3 F (36.3 C)-98.3 F (36.8 C)] 98.1 F (36.7 C) (09/25 0532) Pulse Rate:  [30-78] 60 (09/25 0532) Resp:  [7-26] 16 (09/25 0532) BP: (104-180)/(60-94) 121/60 (09/25 0808) SpO2:  [90 %-100 %] 98 % (09/25 0532)  Intake/Output from previous day:  Intake/Output Summary (Last 24 hours) at 07/18/2023 0824 Last data filed at 07/18/2023 0814 Gross per 24 hour  Intake 3316.25 ml  Output 1200 ml  Net 2116.25 ml     Intake/Output this shift: Total I/O In: 240 [P.O.:240] Out: -   Labs: Recent Labs    07/18/23 0323  HGB 12.0*   Recent Labs    07/18/23 0323  WBC 14.4*  RBC 3.80*  HCT 36.5*  PLT 188   Recent Labs    07/18/23 0323  NA 135  K 4.1  CL 101  CO2 22  BUN 19  CREATININE 1.14  GLUCOSE 148*  CALCIUM 8.1*   No results for input(s): "LABPT", "INR" in the last 72 hours.  Exam: General - Patient is Alert and Oriented Extremity - Neurologically intact Sensation intact distally Intact pulses distally Dorsiflexion/Plantar flexion intact Dressing - dressing C/D/I Motor Function - intact, moving foot and toes well on exam.   Past Medical History:  Diagnosis Date   Arthritis    Complication of anesthesia     had spinal with knee surgeries  -"heart rate too low" for general   Detached retina, left    L eye normal vision, reports that he has had a retina procedure in a doctor's office at [facility]    Diabetes mellitus without complication (HCC)    Dysrhythmia    slight irregular rate   GERD (gastroesophageal reflux disease)     hx no problems now   H/O echocardiogram 2014   History of hiatal hernia    ?20 yrs ago   Hyperopia 2016   Hypertension    Lumbar stenosis with neurogenic claudication    Lymphoma (HCC)    MG, ocular (myasthenia gravis) (HCC)    ? of L eye,    Pneumonia    Prediabetes    Presbyopia OU 2016    Assessment/Plan: 1 Day Post-Op Procedure(s) (LRB): TOTAL HIP ARTHROPLASTY ANTERIOR APPROACH (Left) Principal Problem:   S/P total left hip arthroplasty  Estimated body mass index is 37.09 kg/m as calculated from the following:   Height as of this encounter: 6' (1.829 m).   Weight as of this encounter: 124.1 kg. Advance diet Up with therapy D/C IV fluids  DVT Prophylaxis - Aspirin Weight bearing as tolerated.  Hgb stable at 12 this AM. Cr. 1.14 - we will use short term celebrex in attempt to deter heterotopic ossification based on intra-op findings.   Plan is to go Home after hospital stay. Plan for discharge today after meeting goals with therapy. Follow up in the office in 2 weeks.   Rosalene Billings, PA-C Orthopedic Surgery [phone] 07/18/2023, 8:24 AM

## 2023-07-24 NOTE — Discharge Summary (Signed)
Patient ID: [name] [name] MRN: [id number] DOB/AGE: February 06, 1941 82 y.o.  Admit date: 07/17/2023 Discharge date: 07/18/2023  Admission Diagnoses:  Left hip osteoarthritis  Discharge Diagnoses:  Principal Problem:   S/P total left hip arthroplasty   Past Medical History:  Diagnosis Date   Arthritis    Complication of anesthesia     had spinal with knee surgeries  -"heart rate too low" for general   Detached retina, left    L eye normal vision, reports that he has had a retina procedure in a doctor's office at [facility]    Diabetes mellitus without complication (HCC)    Dysrhythmia    slight irregular rate   GERD (gastroesophageal reflux disease)    hx no problems now   H/O echocardiogram 2014   History of hiatal hernia    ?20 yrs ago   Hyperopia 2016   Hypertension    Lumbar stenosis with neurogenic claudication    Lymphoma (HCC)    MG, ocular (myasthenia gravis) (HCC)    ? of L eye,    Pneumonia    Prediabetes    Presbyopia OU 2016    Surgeries: Procedure(s): TOTAL HIP ARTHROPLASTY ANTERIOR APPROACH on 07/17/2023   Consultants:   Discharged Condition: Improved  Hospital Course: [name] [name] is an 82 y.o. male who was admitted 07/17/2023 for operative treatment ofS/P total left hip arthroplasty. Patient has severe unremitting pain that affects sleep, daily activities, and work/hobbies. After pre-op clearance the patient was taken to the operating room on 07/17/2023 and underwent  Procedure(s): TOTAL HIP ARTHROPLASTY ANTERIOR APPROACH.    Patient was given perioperative antibiotics:  Anti-infectives (From admission, onward)    Start     Dose/Rate Route Frequency Ordered Stop   07/17/23 1611  ceFAZolin (ANCEF) 2-4 GM/100ML-% IVPB       Note to Pharmacy: Tildon Husky K: cabinet override      07/17/23 1611 07/17/23 1641   07/17/23 1530  ceFAZolin (ANCEF) IVPB 2g/100 mL premix        2 g 200 mL/hr over 30 Minutes Intravenous Every 6 hours 07/17/23 1352 07/18/23 0732    07/17/23 0645  ceFAZolin (ANCEF) IVPB 3g/100 mL premix        3 g 200 mL/hr over 30 Minutes Intravenous On call to O.R. 07/17/23 7846 07/17/23 9629        Patient was given sequential compression devices, early ambulation, and chemoprophylaxis to prevent DVT. Patient worked with PT and was meeting their goals regarding safe ambulation and transfers.  Patient benefited maximally from hospital stay and there were no complications.    Recent vital signs: No data found.   Recent laboratory studies: No results for input(s): "WBC", "HGB", "HCT", "PLT", "NA", "K", "CL", "CO2", "BUN", "CREATININE", "GLUCOSE", "INR", "CALCIUM" in the last 72 hours.  Invalid input(s): "PT", "2"   Discharge Medications:   Allergies as of 07/18/2023       Reactions   Doxycycline Rash        Medication List     TAKE these medications    allopurinol 300 MG tablet Commonly known as: ZYLOPRIM TAKE 1 TABLET BY MOUTH EVERY DAY   aspirin 81 MG chewable tablet Chew 1 tablet (81 mg total) by mouth 2 (two) times daily for 28 days. What changed: when to take this   B-12 PO Take 1 tablet by mouth daily. daily   carvedilol 12.5 MG tablet Commonly known as: COREG Take 1 tablet (12.5 mg total) by mouth 2 (two)  times daily.   celecoxib 200 MG capsule Commonly known as: CELEBREX Take 1 capsule (200 mg total) by mouth daily.   Fish Oil 1000 MG Caps Take 1,000 mg by mouth daily.   furosemide 40 MG tablet Commonly known as: LASIX TAKE 1 TABLET (40 MG TOTAL) BY MOUTH DAILY AS NEEDED FOR FLUID OR EDEMA.   Klor-Con M10 10 MEQ tablet Generic drug: potassium chloride TAKE 1 TABLET BY MOUTH EVERY DAY   losartan 100 MG tablet Commonly known as: COZAAR Take 1 tablet (100 mg total) by mouth daily.   metFORMIN 500 MG tablet Commonly known as: GLUCOPHAGE Take 1 tablet (500 mg total) by mouth daily with breakfast.   methocarbamol 500 MG tablet Commonly known as: ROBAXIN Take 1 tablet (500 mg total)  by mouth every 6 (six) hours as needed for muscle spasms.   OneTouch Delica Plus Lancet33G Misc Use to check blood glucose once a day (Dx: 11.9 type 2 DM   OneTouch Verio Flex System w/Device Kit Use to check blood glucose once daily (DX: E11.9  type 2 diabetes)   OneTouch Verio test strip Generic drug: glucose blood Use to check blood glucose once a day (Dx: 11.9 - type 2 DM)   oxyCODONE 5 MG immediate release tablet Commonly known as: Oxy IR/ROXICODONE Take 1 tablet (5 mg total) by mouth every 4 (four) hours as needed for severe pain.   polyethylene glycol 17 g packet Commonly known as: MIRALAX / GLYCOLAX Take 17 g by mouth 2 (two) times daily.   rosuvastatin 10 MG tablet Commonly known as: CRESTOR TAKE 1 TABLET BY MOUTH EVERY DAY   senna 8.6 MG Tabs tablet Commonly known as: SENOKOT Take 2 tablets (17.2 mg total) by mouth at bedtime for 14 days.   spironolactone 25 MG tablet Commonly known as: ALDACTONE TAKE 1/2 TABLET BY MOUTH EVERY DAY   VITAMIN C PO Take 1 tablet by mouth daily.               Discharge Care Instructions  (From admission, onward)           Start     Ordered   07/18/23 0000  Change dressing       Comments: Maintain surgical dressing until follow up in the clinic. If the edges start to pull up, may reinforce with tape. If the dressing is no longer working, may remove and cover with gauze and tape, but must keep the area dry and clean.  Call with any questions or concerns.   07/18/23 0830            Diagnostic Studies: DG Pelvis Portable  Result Date: 07/17/2023 CLINICAL DATA:  Status post hip arthroplasty. EXAM: PORTABLE PELVIS 1-2 VIEWS COMPARISON:  None Available. FINDINGS: Left hip arthroplasty in expected alignment. No periprosthetic lucency or fracture. Recent postsurgical change includes air and edema in the soft tissues. IMPRESSION: Left hip arthroplasty without immediate postoperative complication. Electronically Signed   By:  Narda Rutherford M.D.   On: 07/17/2023 11:37   DG HIP UNILAT WITH PELVIS 1V LEFT  Result Date: 07/17/2023 CLINICAL DATA:  Elective surgery. EXAM: DG HIP (WITH OR WITHOUT PELVIS) 1V*L* COMPARISON:  None Available. FINDINGS: Two fluoroscopic spot views of the pelvis and left hip obtained in the operating room. Images during hip arthroplasty. Fluoroscopy time 9 seconds. Dose 2.0825 mGy. IMPRESSION: Intraoperative fluoroscopy for left hip arthroplasty. Electronically Signed   By: Narda Rutherford M.D.   On: 07/17/2023 10:52   DG C-Arm  1-60 Min-No Report  Result Date: 07/17/2023 Fluoroscopy was utilized by the requesting physician.  No radiographic interpretation.    Disposition: Discharge disposition: 01-Home or Self Care       Discharge Instructions     Call MD / Call 911   Complete by: As directed    If you experience chest pain or shortness of breath, CALL 911 and be transported to the hospital emergency room.  If you develope a fever above 101 F, pus (white drainage) or increased drainage or redness at the wound, or calf pain, call your surgeon's office.   Change dressing   Complete by: As directed    Maintain surgical dressing until follow up in the clinic. If the edges start to pull up, may reinforce with tape. If the dressing is no longer working, may remove and cover with gauze and tape, but must keep the area dry and clean.  Call with any questions or concerns.   Constipation Prevention   Complete by: As directed    Drink plenty of fluids.  Prune juice may be helpful.  You may use a stool softener, such as Colace (over the counter) 100 mg twice a day.  Use MiraLax (over the counter) for constipation as needed.   Diet - low sodium heart healthy   Complete by: As directed    Increase activity slowly as tolerated   Complete by: As directed    Weight bearing as tolerated with assist device (walker, cane, etc) as directed, use it as long as suggested by your surgeon or therapist,  typically at least 4-6 weeks.   Post-operative opioid taper instructions:   Complete by: As directed    POST-OPERATIVE OPIOID TAPER INSTRUCTIONS: It is important to wean off of your opioid medication as soon as possible. If you do not need pain medication after your surgery it is ok to stop day one. Opioids include: Codeine, Hydrocodone(Norco, Vicodin), Oxycodone(Percocet, oxycontin) and hydromorphone amongst others.  Long term and even short term use of opiods can cause: Increased pain response Dependence Constipation Depression Respiratory depression And more.  Withdrawal symptoms can include Flu like symptoms Nausea, vomiting And more Techniques to manage these symptoms Hydrate well Eat regular healthy meals Stay active Use relaxation techniques(deep breathing, meditating, yoga) Do Not substitute Alcohol to help with tapering If you have been on opioids for less than two weeks and do not have pain than it is ok to stop all together.  Plan to wean off of opioids This plan should start within one week post op of your joint replacement. Maintain the same interval or time between taking each dose and first decrease the dose.  Cut the total daily intake of opioids by one tablet each day Next start to increase the time between doses. The last dose that should be eliminated is the evening dose.      TED hose   Complete by: As directed    Use stockings (TED hose) for 2 weeks on both leg(s).  You may remove them at night for sleeping.        Follow-up Information     Cassandria Anger, PA-C. Go on 08/01/2023.   Specialty: Orthopedic Surgery Why: You are scheduled for first post op appt on Wednesday October 9 at 3:15pm. Contact information: [address] [place] [address] [phone]                  Signed: Cassandria Anger 07/24/2023, 7:20 AM

## 2023-07-26 ENCOUNTER — Encounter: Payer: Self-pay | Admitting: Internal Medicine

## 2023-08-29 ENCOUNTER — Other Ambulatory Visit: Payer: Self-pay | Admitting: Internal Medicine

## 2023-09-05 DIAGNOSIS — Z5189 Encounter for other specified aftercare: Secondary | ICD-10-CM | POA: Diagnosis not present

## 2023-09-28 ENCOUNTER — Ambulatory Visit (INDEPENDENT_AMBULATORY_CARE_PROVIDER_SITE_OTHER): Payer: PPO | Admitting: Podiatry

## 2023-09-28 ENCOUNTER — Encounter: Payer: Self-pay | Admitting: Podiatry

## 2023-09-28 DIAGNOSIS — B351 Tinea unguium: Secondary | ICD-10-CM | POA: Diagnosis not present

## 2023-09-28 DIAGNOSIS — M79675 Pain in left toe(s): Secondary | ICD-10-CM

## 2023-09-28 DIAGNOSIS — L608 Other nail disorders: Secondary | ICD-10-CM

## 2023-09-28 DIAGNOSIS — M79674 Pain in right toe(s): Secondary | ICD-10-CM | POA: Diagnosis not present

## 2023-09-28 DIAGNOSIS — E1169 Type 2 diabetes mellitus with other specified complication: Secondary | ICD-10-CM | POA: Diagnosis not present

## 2023-09-28 NOTE — Progress Notes (Signed)
This patient returns to my office for at risk foot care.  This patient requires this care by a professional since this patient will be at risk due to having diabetes type 2. This patient is having problems with his left knee and hip.  This patient is unable to cut nails himself since the patient cannot reach his nails.These nails are painful walking and wearing shoes.  This patient presents for at risk foot care today.  General Appearance  Alert, conversant and in no acute stress.  Vascular  Dorsalis pedis  are palpable  bilaterally. Posterior tibial pulses are absent  B/L. Capillary return is within normal limits  bilaterally. Cold feet  Bilaterally. Absent hair  B/L.  Neurologic  Senn-Weinstein monofilament wire test within normal limits/diminished   bilaterally. Muscle power within normal limits bilaterally.  Nails Thick disfigured discolored nails with subungual debris  Hallux nails. No evidence of bacterial infection or drainage bilaterally.  Orthopedic  No limitations of motion  feet .  No crepitus or effusions noted.  No bony pathology or digital deformities noted.  HAV  B/L.  Skin  normotropic skin with no porokeratosis noted bilaterally.  No signs of infections or ulcers noted.     Onychomycosis  Pain in right toes  Pain in left toes  Consent was obtained for treatment procedures.   Mechanical debridement of nails 1-5  bilaterally performed with a nail nipper.  Filed with dremel without incident.    Return office visit     3 months.                Told patient to return for periodic foot care and evaluation due to potential at risk complications.   Nieshia Larmon DPM  

## 2023-10-04 ENCOUNTER — Inpatient Hospital Stay: Payer: PPO | Attending: Hematology & Oncology

## 2023-10-04 ENCOUNTER — Inpatient Hospital Stay: Payer: PPO | Admitting: Hematology & Oncology

## 2023-10-04 ENCOUNTER — Encounter: Payer: Self-pay | Admitting: Hematology & Oncology

## 2023-10-04 VITALS — BP 180/71 | HR 55 | Temp 97.8°F | Resp 20 | Ht 72.0 in | Wt 280.0 lb

## 2023-10-04 DIAGNOSIS — C8440 Peripheral T-cell lymphoma, not classified, unspecified site: Secondary | ICD-10-CM

## 2023-10-04 DIAGNOSIS — Z79899 Other long term (current) drug therapy: Secondary | ICD-10-CM | POA: Insufficient documentation

## 2023-10-04 DIAGNOSIS — Z9221 Personal history of antineoplastic chemotherapy: Secondary | ICD-10-CM | POA: Insufficient documentation

## 2023-10-04 DIAGNOSIS — C8651 Angioimmunoblastic t-cell lymphoma, in remission: Secondary | ICD-10-CM | POA: Insufficient documentation

## 2023-10-04 LAB — CBC WITH DIFFERENTIAL (CANCER CENTER ONLY)
Abs Immature Granulocytes: 0.02 10*3/uL (ref 0.00–0.07)
Basophils Absolute: 0.1 10*3/uL (ref 0.0–0.1)
Basophils Relative: 1 %
Eosinophils Absolute: 0.4 10*3/uL (ref 0.0–0.5)
Eosinophils Relative: 6 %
HCT: 38.4 % — ABNORMAL LOW (ref 39.0–52.0)
Hemoglobin: 13 g/dL (ref 13.0–17.0)
Immature Granulocytes: 0 %
Lymphocytes Relative: 18 %
Lymphs Abs: 1.2 10*3/uL (ref 0.7–4.0)
MCH: 31.9 pg (ref 26.0–34.0)
MCHC: 33.9 g/dL (ref 30.0–36.0)
MCV: 94.3 fL (ref 80.0–100.0)
Monocytes Absolute: 0.6 10*3/uL (ref 0.1–1.0)
Monocytes Relative: 9 %
Neutro Abs: 4.4 10*3/uL (ref 1.7–7.7)
Neutrophils Relative %: 66 %
Platelet Count: 219 10*3/uL (ref 150–400)
RBC: 4.07 MIL/uL — ABNORMAL LOW (ref 4.22–5.81)
RDW: 12.7 % (ref 11.5–15.5)
WBC Count: 6.7 10*3/uL (ref 4.0–10.5)
nRBC: 0 % (ref 0.0–0.2)

## 2023-10-04 LAB — CMP (CANCER CENTER ONLY)
ALT: 13 U/L (ref 0–44)
AST: 11 U/L — ABNORMAL LOW (ref 15–41)
Albumin: 4.3 g/dL (ref 3.5–5.0)
Alkaline Phosphatase: 76 U/L (ref 38–126)
Anion gap: 8 (ref 5–15)
BUN: 12 mg/dL (ref 8–23)
CO2: 29 mmol/L (ref 22–32)
Calcium: 9.3 mg/dL (ref 8.9–10.3)
Chloride: 106 mmol/L (ref 98–111)
Creatinine: 1.04 mg/dL (ref 0.61–1.24)
GFR, Estimated: >60 mL/min (ref >60)
Glucose, Bld: 137 mg/dL — ABNORMAL HIGH (ref 70–99)
Potassium: 5.2 mmol/L — ABNORMAL HIGH (ref 3.5–5.1)
Sodium: 143 mmol/L (ref 135–145)
Total Bilirubin: 0.5 mg/dL (ref <1.2)
Total Protein: 6.8 g/dL (ref 6.5–8.1)

## 2023-10-04 LAB — LACTATE DEHYDROGENASE: LDH: 128 U/L (ref 98–192)

## 2023-10-04 NOTE — Progress Notes (Signed)
BP remains elevated, 180/71, has not taken medicine today. Instructed to monitor at home and if it remains elevated, notify PCP. Verbalized understanding.

## 2023-10-04 NOTE — Progress Notes (Signed)
This man Hematology and Oncology Follow Up Visit  [name] [name] [id number] [birth year] 82 y.o. 10/04/2023   Principle Diagnosis:  Stage IV angioimmunoblastic T-cell NHL  Current Therapy:   S/p CHP + Adcetris x 6 cycles -- completed on 01/19/2020     Interim History:  [name] [name] is back for follow-up.  It has been 6 months since we last saw him.  Since then, he had hip surgery.  This was of the left hip.  He had a hip surgery on 07/18/2023.  He was in the hospital overnight.  He did quite well.  He is still playing gospel music.  He is not as busy since he had a hip surgery.  Hopefully, next year he will be able to go out and play more.  He has had no problems with nausea or vomiting.  Has had no change in bowel or bladder habits.  Unfortunately, his poor wife is still in the nursing home dealing with dementia.  He has had no cough.  There is been no problems with COVID.  Currently, I would say that his performance status is probably ECOG 1.    Medications:  Current Outpatient Medications:    allopurinol (ZYLOPRIM) 300 MG tablet, TAKE 1 TABLET BY MOUTH EVERY DAY, Disp: 90 tablet, Rfl: 1   Ascorbic Acid (VITAMIN C PO), Take 1 tablet by mouth daily., Disp: , Rfl:    carvedilol (COREG) 12.5 MG tablet, Take 1 tablet (12.5 mg total) by mouth 2 (two) times daily., Disp: 180 tablet, Rfl: 1   Cyanocobalamin (B-12 PO), Take 1 tablet by mouth daily. daily, Disp: , Rfl:    diphenhydramine-acetaminophen (TYLENOL PM) 25-500 MG TABS tablet, Take 1 tablet by mouth at bedtime as needed., Disp: , Rfl:    KLOR-CON M10 10 MEQ tablet, TAKE 1 TABLET BY MOUTH EVERY DAY, Disp: 90 tablet, Rfl: 1   losartan (COZAAR) 100 MG tablet, Take 1 tablet (100 mg total) by mouth daily., Disp: 90 tablet, Rfl: 1   metFORMIN (GLUCOPHAGE) 500 MG tablet, Take 1 tablet (500 mg total) by mouth daily with breakfast., Disp: 90 tablet, Rfl: 1   methocarbamol (ROBAXIN) 500 MG tablet, Take 1 tablet (500 mg total) by mouth  every 6 (six) hours as needed for muscle spasms., Disp: 40 tablet, Rfl: 2   Omega-3 Fatty Acids (FISH OIL) 1000 MG CAPS, Take 1,000 mg by mouth daily., Disp: , Rfl:    oxyCODONE (OXY IR/ROXICODONE) 5 MG immediate release tablet, Take 1 tablet (5 mg total) by mouth every 4 (four) hours as needed for severe pain., Disp: 30 tablet, Rfl: 0   polyethylene glycol (MIRALAX / GLYCOLAX) 17 g packet, Take 17 g by mouth 2 (two) times daily., Disp: 14 each, Rfl: 0   rosuvastatin (CRESTOR) 10 MG tablet, TAKE 1 TABLET BY MOUTH EVERY DAY, Disp: 90 tablet, Rfl: 3   spironolactone (ALDACTONE) 25 MG tablet, TAKE 1/2 TABLET BY MOUTH EVERY DAY, Disp: 45 tablet, Rfl: 7   Blood Glucose Monitoring Suppl (ONETOUCH VERIO FLEX SYSTEM) w/Device KIT, Use to check blood glucose once daily (DX: E11.9  type 2 diabetes) (Patient not taking: Reported on 10/04/2023), Disp: 1 kit, Rfl: 0   glucose blood (ONETOUCH VERIO) test strip, Use to check blood glucose once a day (Dx: 11.9 - type 2 DM) (Patient not taking: Reported on 10/04/2023), Disp: 100 each, Rfl: 3   Lancets (ONETOUCH DELICA PLUS LANCET33G) MISC, Use to check blood glucose once a day (Dx: 11.9 type 2 DM (  Patient not taking: Reported on 10/04/2023), Disp: 100 each, Rfl: 3  Allergies:  Allergies  Allergen Reactions   Doxycycline Rash    Past Medical History, Surgical history, Social history, and Family History were reviewed and updated.  Review of Systems: Review of Systems  Constitutional: Negative.   HENT:  Negative.    Eyes: Negative.   Respiratory:  Positive for shortness of breath.   Cardiovascular:  Positive for leg swelling.  Gastrointestinal:  Positive for nausea.  Endocrine: Negative.   Genitourinary: Negative.    Musculoskeletal:  Positive for arthralgias.  Skin: Negative.   Neurological: Negative.   Hematological: Negative.   Psychiatric/Behavioral: Negative.      Physical Exam:  height is 6' (1.829 m) and weight is 280 lb (127 kg). His tympanic  temperature is 97.8 F (36.6 C). His blood pressure is 180/71 (abnormal) and his pulse is 55 (abnormal). His respiration is 20 and oxygen saturation is 100%.   Wt Readings from Last 3 Encounters:  10/04/23 280 lb (127 kg)  07/17/23 273 lb 8 oz (124.1 kg)  07/11/23 273 lb 8 oz (124.1 kg)    Physical Exam Vitals reviewed.  HENT:     Head: Normocephalic and atraumatic.  Eyes:     Pupils: Pupils are equal, round, and reactive to light.  Cardiovascular:     Rate and Rhythm: Normal rate and regular rhythm.     Heart sounds: Normal heart sounds.  Pulmonary:     Effort: Pulmonary effort is normal.     Breath sounds: Normal breath sounds.  Abdominal:     General: Bowel sounds are normal.     Palpations: Abdomen is soft.  Musculoskeletal:        General: No tenderness or deformity. Normal range of motion.     Cervical back: Normal range of motion.  Lymphadenopathy:     Cervical: No cervical adenopathy.  Skin:    General: Skin is warm and dry.     Findings: No erythema or rash.  Neurological:     Mental Status: He is alert and oriented to person, place, and time.  Psychiatric:        Behavior: Behavior normal.        Thought Content: Thought content normal.        Judgment: Judgment normal.      Lab Results  Component Value Date   WBC 6.7 10/04/2023   HGB 13.0 10/04/2023   HCT 38.4 (L) 10/04/2023   MCV 94.3 10/04/2023   PLT 219 10/04/2023     Chemistry      Component Value Date/Time   NA 143 10/04/2023 0827   K 5.2 (H) 10/04/2023 0827   CL 106 10/04/2023 0827   CO2 29 10/04/2023 0827   BUN 12 10/04/2023 0827   CREATININE 1.04 10/04/2023 0827   CREATININE 1.24 (H) 09/13/2022 1537      Component Value Date/Time   CALCIUM 9.3 10/04/2023 0827   ALKPHOS 76 10/04/2023 0827   AST 11 (L) 10/04/2023 0827   ALT 13 10/04/2023 0827   BILITOT 0.5 10/04/2023 0827      Impression and Plan: [name] [name] is a very nice 82 year old white male.  He had stage IV T-cell lymphoma.   This was an angioimmunoblastic lymphoma.  He was treated with chemotherapy and targeted therapy.  He completed 6 cycles of treatment back on 01/19/2020.  He went into remission.  He is still in remission.  I am glad to have the hip surgery.  He is going around a whole lot better.  Thankfully, he had no problems with recovery despite the diabetes.  We will go ahead and plan to get him back to see [place] in 6 months.  Josph Macho, MD 12/12/20249:43 AM

## 2023-10-09 ENCOUNTER — Other Ambulatory Visit: Payer: Self-pay | Admitting: Hematology & Oncology

## 2023-10-09 DIAGNOSIS — C8440 Peripheral T-cell lymphoma, not classified, unspecified site: Secondary | ICD-10-CM

## 2023-11-12 ENCOUNTER — Ambulatory Visit (INDEPENDENT_AMBULATORY_CARE_PROVIDER_SITE_OTHER): Payer: PPO | Admitting: Internal Medicine

## 2023-11-12 ENCOUNTER — Encounter: Payer: Self-pay | Admitting: Internal Medicine

## 2023-11-12 VITALS — BP 138/72 | HR 55 | Temp 97.8°F | Resp 16 | Ht 72.0 in | Wt 280.2 lb

## 2023-11-12 DIAGNOSIS — E538 Deficiency of other specified B group vitamins: Secondary | ICD-10-CM | POA: Diagnosis not present

## 2023-11-12 DIAGNOSIS — E1169 Type 2 diabetes mellitus with other specified complication: Secondary | ICD-10-CM

## 2023-11-12 DIAGNOSIS — E785 Hyperlipidemia, unspecified: Secondary | ICD-10-CM

## 2023-11-12 DIAGNOSIS — E1159 Type 2 diabetes mellitus with other circulatory complications: Secondary | ICD-10-CM | POA: Diagnosis not present

## 2023-11-12 DIAGNOSIS — I152 Hypertension secondary to endocrine disorders: Secondary | ICD-10-CM | POA: Diagnosis not present

## 2023-11-12 DIAGNOSIS — Z7984 Long term (current) use of oral hypoglycemic drugs: Secondary | ICD-10-CM | POA: Diagnosis not present

## 2023-11-12 LAB — BASIC METABOLIC PANEL
BUN: 14 mg/dL (ref 6–23)
CO2: 24 meq/L (ref 19–32)
Calcium: 9 mg/dL (ref 8.4–10.5)
Chloride: 103 meq/L (ref 96–112)
Creatinine, Ser: 0.94 mg/dL (ref 0.40–1.50)
GFR: 76.12 mL/min (ref >60.00)
Glucose, Bld: 107 mg/dL — ABNORMAL HIGH (ref 70–99)
Potassium: 4.4 meq/L (ref 3.5–5.1)
Sodium: 138 meq/L (ref 135–145)

## 2023-11-12 LAB — MICROALBUMIN / CREATININE URINE RATIO
Creatinine,U: 54.4 mg/dL
Microalb Creat Ratio: 73.6 mg/g — ABNORMAL HIGH (ref 0.0–30.0)
Microalb, Ur: 40 mg/dL — ABNORMAL HIGH (ref 0.0–1.9)

## 2023-11-12 LAB — B12 AND FOLATE PANEL
Folate: 13.1 ng/mL (ref >5.9)
Vitamin B-12: 848 pg/mL (ref 211–911)

## 2023-11-12 LAB — HEMOGLOBIN A1C: Hgb A1c MFr Bld: 6.3 % (ref 4.6–6.5)

## 2023-11-12 MED ORDER — CYANOCOBALAMIN 500 MCG PO TABS: 500.0000 ug | ORAL_TABLET | Freq: Every day | ORAL | Status: AC

## 2023-11-12 MED ORDER — POTASSIUM CHLORIDE ER 10 MEQ PO TBCR: 10.0000 meq | EXTENDED_RELEASE_TABLET | ORAL | Status: DC

## 2023-11-12 NOTE — Patient Instructions (Addendum)
Vaccines I recommend: Covid booster  Continue checking your blood pressure regularly Blood pressure goal:  between 110/65 and  140/85. If it is consistently higher or lower, let me know     GO TO THE LAB : Get the blood work     Next visit with me in about 4 months for a physical exam Please schedule it at the front desk    Per our records you are due for your diabetic eye exam. Please contact your eye doctor to schedule an appointment. Please have them send copies of your office visit notes to [place]. Our fax number is [phone]. If you need a referral to an eye doctor please let us know.

## 2023-11-12 NOTE — Progress Notes (Unsigned)
Subjective:    Patient ID: [name] [name], male    DOB: 1941-06-21, 83 y.o.   MRN: [id number]  DOS:  11/12/2023 Type of visit - description: Follow-up  Since the last office visit is doing well. Continue recuperating from hip surgery. Denies chest pain or difficulty breathing. No lower extremity edema, no palpitations. BP Readings from Last 3 Encounters:  11/12/23 138/72  10/04/23 (!) 180/71  07/18/23 126/62     Review of Systems See above   Past Medical History:  Diagnosis Date   Arthritis    Complication of anesthesia     had spinal with knee surgeries  -"heart rate too low" for general   Detached retina, left    L eye normal vision, reports that he has had a retina procedure in a doctor's office at [facility]    Diabetes mellitus without complication (HCC)    Dysrhythmia    slight irregular rate   GERD (gastroesophageal reflux disease)    hx no problems now   H/O echocardiogram 2014   History of hiatal hernia    ?20 yrs ago   Hyperopia 2016   Hypertension    Lumbar stenosis with neurogenic claudication    Lymphoma (HCC)    MG, ocular (myasthenia gravis) (HCC)    ? of L eye,    Pneumonia    Prediabetes    Presbyopia OU 2016    Past Surgical History:  Procedure Laterality Date   COLONOSCOPY     HERNIA REPAIR     bilateral inguinal hernia   HERNIA REPAIR     umbilical   IR IMAGING GUIDED PORT INSERTION  09/16/2019   IR REMOVAL TUN ACCESS W/ PORT W/O FL MOD SED  03/04/2020   JOINT REPLACEMENT  2009   right knee   LUMBAR LAMINECTOMY/DECOMPRESSION MICRODISCECTOMY N/A 06/06/2017   Procedure: Decompression L3-5, insitu fusion L3-5 ;  Surgeon: Venita Lick, MD;  Location: [facility] OR;  Service: Orthopedics;  Laterality: N/A;   LYMPH NODE BIOPSY Right 09/01/2019   Procedure: CERVICAL LYMPH NODE BIOPSY;  Surgeon: Flo Shanks, MD;  Location: [facility] OR;  Service: ENT;  Laterality: Right;   MIDDLE EAR SURGERY Right    ear -patched hole in ear drum   RIGHT/LEFT HEART CATH AND  CORONARY ANGIOGRAPHY N/A 11/11/2019   Procedure: RIGHT/LEFT HEART CATH AND CORONARY ANGIOGRAPHY;  Surgeon: Dolores Patty, MD;  Location: [facility] INVASIVE CV LAB;  Service: Cardiovascular;  Laterality: N/A;   TOTAL HIP ARTHROPLASTY Left 07/17/2023   Procedure: TOTAL HIP ARTHROPLASTY ANTERIOR APPROACH;  Surgeon: Durene Romans, MD;  Location: [facility] ORS;  Service: Orthopedics;  Laterality: Left;   TOTAL KNEE ARTHROPLASTY  09/10/2012   Procedure: TOTAL KNEE ARTHROPLASTY;  Surgeon: Jacki Cones, MD;  Location: [facility] ORS;  Service: Orthopedics;  Laterality: Left;   TOTAL SHOULDER ARTHROPLASTY Right 06/22/2016   Procedure: RIGHT TOTAL SHOULDER ARTHROPLASTY;  Surgeon: Francena Hanly, MD;  Location: [facility] OR;  Service: Orthopedics;  Laterality: Right;    Current Outpatient Medications  Medication Instructions   allopurinol (ZYLOPRIM) 300 mg, Oral, Daily   Ascorbic Acid (VITAMIN C PO) 1 tablet, Daily   Blood Glucose Monitoring Suppl (ONETOUCH VERIO FLEX SYSTEM) w/Device KIT Use to check blood glucose once daily (DX: E11.9  type 2 diabetes)   carvedilol (COREG) 12.5 mg, Oral, 2 times daily   Cyanocobalamin (B-12 PO) 1 tablet, Daily   diphenhydramine-acetaminophen (TYLENOL PM) 25-500 MG TABS tablet 1 tablet, At bedtime PRN   Fish Oil 1,000 mg, Daily  glucose blood (ONETOUCH VERIO) test strip Use to check blood glucose once a day (Dx: 11.9 - type 2 DM)   KLOR-CON M10 10 MEQ tablet 10 mEq, Oral, Daily   Lancets (ONETOUCH DELICA PLUS LANCET33G) MISC Use to check blood glucose once a day (Dx: 11.9 type 2 DM   losartan (COZAAR) 100 mg, Oral, Daily   metFORMIN (GLUCOPHAGE) 500 mg, Oral, Daily with breakfast   methocarbamol (ROBAXIN) 500 mg, Oral, Every 6 hours PRN   oxyCODONE (OXY IR/ROXICODONE) 5 mg, Oral, Every 4 hours PRN   polyethylene glycol (MIRALAX / GLYCOLAX) 17 g, Oral, 2 times daily   rosuvastatin (CRESTOR) 10 MG tablet TAKE 1 TABLET BY MOUTH EVERY DAY   spironolactone (ALDACTONE) 12.5 mg, Oral, Daily        Objective:   Physical Exam BP 138/72   Pulse (!) 55   Temp 97.8 F (36.6 C) (Oral)   Resp 16   Ht 6' (1.829 m)   Wt 280 lb 4 oz (127.1 kg)   SpO2 97%   BMI 38.01 kg/m  General:   Well developed, NAD, BMI noted. HEENT:  Normocephalic . Face symmetric, atraumatic Lungs:  CTA B Normal respiratory effort, no intercostal retractions, no accessory muscle use. Heart: RRR,  no murmur.  DM foot exam: No edema, good pedal pulses, pinprick examination normal Skin: Not pale. Not jaundice Neurologic:  alert & oriented X3.  Speech normal, gait appropriate for age, assisted by a cane Psych--  Cognition and judgment appear intact.  Cooperative with normal attention span and concentration.  Behavior appropriate. No anxious or depressed appearing.      Assessment   Assessment DM + Neuropathy Dx 01-2021 HTN Hyperlipidemia Gout  Morbid obesity Snoring  CV: -- Palpitations, PVCs, h/o bradycardia,s/p. Dr Langston Reusing day monitor, echo 2014: Normal LV, some LVH  --New onset acute syst CHF 10/2019>>  S/p catheterization 11/11/2019: No CAD: Normal EF. Myasthenia gravis (ocular, Dr Everlena Cooper)  h/o detached retina before  Stasis dermatitis T-cell lymphoma, angioimmunoblastic, DX 08-2019 B 12 deficiency, Dx 12/2022  PLAN: DM: On metformin, feet exam negative, check A1c and micro HTN: On carvedilol, losartan, Aldactone.  Most recent potassium was slightly elevated, was recommended to decrease KCl 10 mill equivalents to every other day.  Recheck BMP. Ambulatory BPs mostly 130, 140.  Occasionally in the 150s.  Recommend to continue checking, see AVS. B12 deficiency: On 500 mcg daily, checking levels.  T-cell lymphoma: Saw hematology October 04, 2023.He is still in remission.  DJD: Had a L hip replacement September 2024: Doing well. Vaccine advised: Recommend flu shot RTC around 4 months, CPX 9 DM:A1c 6.1 few days ago, continue metformin. HTN: BP is very good, ambulatory BPs never  more than 140.  Last BMP okay.  Continue carvedilol, losartan, Aldactone, potassium.  Has Lasix, uses it prn, no recent edema. Dyslipidemia, last TG were elevated, he is on Crestor, diet has improved.  Check FLP. Neuropathy: Labs few months ago show low B12, recommended supplements. B12 deficiency: He is actually doing oral supplement.  Checking labs. DJD: Scheduled for a L hip arthroplasty in few days. OSA?  Screening presurgery was positive, Epworth scale today negative.  Observation. Cardiovascular: LOV with cardiology 12/12/2022, they felt that transient LV dysfunction was due to critical illness, next visit 08-2023. T-cell lymphoma stage IV: Saw hematology 03/2023, still in remission. Gout: No recent events, checking a uric acid, continue allopurinol. Preventive care: Flu shot today. RTC 3 months

## 2023-11-13 NOTE — Assessment & Plan Note (Signed)
DM: On metformin, feet exam negative, check A1c and micro HTN: On carvedilol, losartan, Aldactone.  Most recent potassium was slightly elevated, was recommended to decrease KCl 10 mill equivalents to every other day.  Recheck BMP. Ambulatory BPs mostly 130, 140.  Occasionally in the 150s.  Recommend to continue checking, see AVS. B12 deficiency: On 500 mcg daily, checking levels. T-cell lymphoma: Saw hematology October 04, 2023.He is still in remission. DJD: Had a L hip replacement September 2024: Doing well. Vaccine advised: Recommend flu shot RTC around 4 months, CPX

## 2023-12-06 DIAGNOSIS — E119 Type 2 diabetes mellitus without complications: Secondary | ICD-10-CM | POA: Diagnosis not present

## 2023-12-06 DIAGNOSIS — H52203 Unspecified astigmatism, bilateral: Secondary | ICD-10-CM | POA: Diagnosis not present

## 2023-12-06 DIAGNOSIS — H2513 Age-related nuclear cataract, bilateral: Secondary | ICD-10-CM | POA: Diagnosis not present

## 2023-12-06 LAB — HM DIABETES EYE EXAM

## 2023-12-27 ENCOUNTER — Encounter: Payer: Self-pay | Admitting: Podiatry

## 2023-12-27 ENCOUNTER — Ambulatory Visit (INDEPENDENT_AMBULATORY_CARE_PROVIDER_SITE_OTHER): Payer: HMO | Admitting: Podiatry

## 2023-12-27 ENCOUNTER — Encounter: Payer: Self-pay | Admitting: Hematology

## 2023-12-27 VITALS — Ht 72.0 in | Wt 280.0 lb

## 2023-12-27 DIAGNOSIS — B351 Tinea unguium: Secondary | ICD-10-CM

## 2023-12-27 DIAGNOSIS — M79674 Pain in right toe(s): Secondary | ICD-10-CM

## 2023-12-27 DIAGNOSIS — E1169 Type 2 diabetes mellitus with other specified complication: Secondary | ICD-10-CM

## 2023-12-27 DIAGNOSIS — L608 Other nail disorders: Secondary | ICD-10-CM

## 2023-12-27 DIAGNOSIS — M79675 Pain in left toe(s): Secondary | ICD-10-CM | POA: Diagnosis not present

## 2023-12-27 NOTE — Progress Notes (Signed)
This patient returns to my office for at risk foot care.  This patient requires this care by a professional since this patient will be at risk due to having diabetes type 2. This patient is having problems with his left knee and hip.  This patient is unable to cut nails himself since the patient cannot reach his nails.These nails are painful walking and wearing shoes.  This patient presents for at risk foot care today.  General Appearance  Alert, conversant and in no acute stress.  Vascular  Dorsalis pedis  are palpable  bilaterally. Posterior tibial pulses are absent  B/L. Capillary return is within normal limits  bilaterally. Cold feet  Bilaterally. Absent hair  B/L.  Neurologic  Senn-Weinstein monofilament wire test within normal limits/diminished   bilaterally. Muscle power within normal limits bilaterally.  Nails Thick disfigured discolored nails with subungual debris  Hallux nails. No evidence of bacterial infection or drainage bilaterally.  Orthopedic  No limitations of motion  feet .  No crepitus or effusions noted.  No bony pathology or digital deformities noted.  HAV  B/L.  Skin  normotropic skin with no porokeratosis noted bilaterally.  No signs of infections or ulcers noted.     Onychomycosis  Pain in right toes  Pain in left toes  Consent was obtained for treatment procedures.   Mechanical debridement of nails 1-5  bilaterally performed with a nail nipper.  Filed with dremel without incident.    Return office visit     3 months.                Told patient to return for periodic foot care and evaluation due to potential at risk complications.   Nieshia Larmon DPM  

## 2024-01-07 ENCOUNTER — Other Ambulatory Visit (HOSPITAL_COMMUNITY): Payer: Self-pay | Admitting: Internal Medicine

## 2024-01-23 ENCOUNTER — Telehealth (HOSPITAL_COMMUNITY): Payer: Self-pay | Admitting: Internal Medicine

## 2024-01-23 NOTE — Telephone Encounter (Signed)
 Called to confirm/remind patient of their appointment at [facility] on 01/23/24.   Appointment:   [x] Confirmed  [] Left mess   [] No answer/No voice mail  [] Phone not in service  Patient reminded to bring all medications and/or complete list.  Confirmed patient has transportation. Gave directions, instructed to utilize valet parking.

## 2024-01-24 ENCOUNTER — Ambulatory Visit (HOSPITAL_COMMUNITY)
Admission: RE | Admit: 2024-01-24 | Discharge: 2024-01-24 | Disposition: A | Discharge status: Home / Self Care | Payer: PPO | Source: Ambulatory Visit | Attending: Internal Medicine | Admitting: Internal Medicine

## 2024-01-24 ENCOUNTER — Encounter (HOSPITAL_COMMUNITY): Payer: Self-pay | Admitting: Internal Medicine

## 2024-01-24 VITALS — BP 146/72 | HR 57 | Ht 72.0 in | Wt 286.8 lb

## 2024-01-24 DIAGNOSIS — E785 Hyperlipidemia, unspecified: Secondary | ICD-10-CM | POA: Insufficient documentation

## 2024-01-24 DIAGNOSIS — I5022 Chronic systolic (congestive) heart failure: Secondary | ICD-10-CM | POA: Insufficient documentation

## 2024-01-24 DIAGNOSIS — E119 Type 2 diabetes mellitus without complications: Secondary | ICD-10-CM | POA: Diagnosis not present

## 2024-01-24 DIAGNOSIS — I110 Hypertensive heart disease with heart failure: Secondary | ICD-10-CM | POA: Diagnosis not present

## 2024-01-24 DIAGNOSIS — Z79899 Other long term (current) drug therapy: Secondary | ICD-10-CM | POA: Diagnosis not present

## 2024-01-24 DIAGNOSIS — R001 Bradycardia, unspecified: Secondary | ICD-10-CM | POA: Insufficient documentation

## 2024-01-24 DIAGNOSIS — Z8572 Personal history of non-Hodgkin lymphomas: Secondary | ICD-10-CM | POA: Insufficient documentation

## 2024-01-24 DIAGNOSIS — I2510 Atherosclerotic heart disease of native coronary artery without angina pectoris: Secondary | ICD-10-CM | POA: Diagnosis not present

## 2024-01-24 DIAGNOSIS — Z8249 Family history of ischemic heart disease and other diseases of the circulatory system: Secondary | ICD-10-CM | POA: Diagnosis not present

## 2024-01-24 DIAGNOSIS — I10 Essential (primary) hypertension: Secondary | ICD-10-CM

## 2024-01-24 DIAGNOSIS — Z9221 Personal history of antineoplastic chemotherapy: Secondary | ICD-10-CM | POA: Diagnosis not present

## 2024-01-24 MED ORDER — SPIRONOLACTONE 25 MG PO TABS: 25.0000 mg | ORAL_TABLET | Freq: Every day | ORAL | 3 refills | Status: DC

## 2024-01-24 NOTE — Progress Notes (Signed)
 [facility] NOTE  PCP: Wanda Plump, MD Heme/Onc: Dr. Myna Hidalgo Primary Cardiologist: Dr. Elease Hashimoto HF Cardiologist: Dr. Gala Romney  HPI: [name] [name] is a 83 y.o. male with HTN, HL, DM2 and T-cell lymphoma followed by Dr. Elease Hashimoto.   He was diagnosed with  stage IV angioimmunoblastic T-cell lymphoma in 12/20 (treated with doxorubicin, Cytoxan, Adcetris 09/29/2019). After first round of chemo was admitted 09/2019 with acute hypoxic respiratory failure due to PNA and possibly new heart failure.  Echocardiogram showed EF of 45 to 50% with mild apical anterior wall motion abnormality is felt secondary to chemotherapy.  He developed AKI with diuresis.   Cath 1/21 with minimal CAD (LAD 30%) and normal hemodynamics.  Subsequently chemo regimen was changed to Adcetris + CHP w/ Onpro. Finished chemo in 5/21 and now felt to be in remission.   Echo 12/22/19: EF 60-65%   Follow up 12/22, remained in remission. Volume stable, low dose spiro added.    Echo  12/12/22, EF 60-65% Personally reviewed  Today he returns for yearly HF follow up. Doing well. No CP or SOB. No edema,orthopnea or PND. SBP goes up to 150 at times and he takes extra spiro and it comes back down   Cardiac Studies  - Echo (3/21): EF 60-65%  - Cath (1/21): LAD 30% lesion otherwise normal coronaries, normal hemodynamics, LVEF 60-65%  Ao = 122/64 (89) LV =  133/7 RA =  1 RV = 17/2 PA =  16/2 (10) PCW = 7 Fick cardiac output/index = 9.9/4.2 PVR = < 1.0 WU FA sat = 98% PA sat = 75%, 77%  Past Medical History:  Diagnosis Date   Arthritis    Complication of anesthesia     had spinal with knee surgeries  -"heart rate too low" for general   Detached retina, left    L eye normal vision, reports that he has had a retina procedure in a doctor's office at [facility]    Diabetes mellitus without complication (HCC)    Dysrhythmia    slight irregular rate   GERD (gastroesophageal reflux disease)    hx no problems now   H/O  echocardiogram 2014   History of hiatal hernia    ?20 yrs ago   Hyperopia 2016   Hypertension    Lumbar stenosis with neurogenic claudication    Lymphoma (HCC)    MG, ocular (myasthenia gravis) (HCC)    ? of L eye,    Pneumonia    Prediabetes    Presbyopia OU 2016   Current Outpatient Medications  Medication Sig Dispense Refill   allopurinol (ZYLOPRIM) 300 MG tablet TAKE 1 TABLET BY MOUTH EVERY DAY 90 tablet 1   Ascorbic Acid (VITAMIN C PO) Take 1 tablet by mouth daily.     carvedilol (COREG) 12.5 MG tablet Take 1 tablet (12.5 mg total) by mouth 2 (two) times daily. 180 tablet 1   celecoxib (CELEBREX) 200 MG capsule Take 200 mg by mouth daily.     cyanocobalamin (VITAMIN B12) 500 MCG tablet Take 1 tablet (500 mcg total) by mouth daily.     diphenhydramine-acetaminophen (TYLENOL PM) 25-500 MG TABS tablet Take 1 tablet by mouth at bedtime as needed.     glucose blood (ONETOUCH VERIO) test strip Use to check blood glucose once a day (Dx: 11.9 - type 2 DM) 100 each 3   losartan (COZAAR) 100 MG tablet Take 1 tablet (100 mg total) by mouth daily. 90 tablet 1   metFORMIN (GLUCOPHAGE)  500 MG tablet Take 1 tablet (500 mg total) by mouth daily with breakfast. 90 tablet 1   Omega-3 Fatty Acids (FISH OIL) 1000 MG CAPS Take 1,000 mg by mouth daily.     polyethylene glycol (MIRALAX / GLYCOLAX) 17 g packet Take 17 g by mouth 2 (two) times daily. 14 each 0   potassium chloride (KLOR-CON 10) 10 MEQ tablet Take 1 tablet (10 mEq total) by mouth every other day.     rosuvastatin (CRESTOR) 10 MG tablet TAKE 1 TABLET BY MOUTH EVERY DAY 90 tablet 3   spironolactone (ALDACTONE) 25 MG tablet TAKE 1/2 TABLET BY MOUTH DAILY 45 tablet 3   No current facility-administered medications for this encounter.   Allergies  Allergen Reactions   Doxycycline Rash   Social History   Socioeconomic History   Marital status: Married    Spouse name: Not on file   Number of children: 4   Years of education: Not on file    Highest education level: Not on file  Occupational History   Occupation: Systems analyst company-- retired 2010  Tobacco Use   Smoking status: Former    Current packs/day: 0.00    Average packs/day: 0.5 packs/day for 2.0 years (1.0 ttl pk-yrs)    Types: Cigarettes    Start date: 06/09/1964    Quit date: 06/09/1966    Years since quitting: 57.6   Smokeless tobacco: Never  Vaping Use   Vaping status: Never Used  Substance and Sexual Activity   Alcohol use: No    Alcohol/week: 0.0 standard drinks of alcohol    Comment: none   Drug use: No   Sexual activity: Not Currently    Partners: Female  Other Topics Concern   Not on file  Social History Narrative   married, 2nd wife at a nursing home, memory unit   Lives alone , lives independently     2 living children, lost 2 kids     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   Social Drivers of Health   Financial Resource Strain: Low Risk  (03/21/2023)   Overall Financial Resource Strain (CARDIA)    Difficulty of Paying Living Expenses: Not hard at all  Food Insecurity: No Food Insecurity (07/17/2023)   Hunger Vital Sign    Worried About Running Out of Food in the Last Year: Never true    Ran Out of Food in the Last Year: Never true  Transportation Needs: No Transportation Needs (07/17/2023)   PRAPARE - Administrator, Civil Service (Medical): No    Lack of Transportation (Non-Medical): No  Physical Activity: Insufficiently Active (03/10/2022)   Exercise Vital Sign    Days of Exercise per Week: 7 days    Minutes of Exercise per Session: 20 min  Stress: No Stress Concern Present (03/10/2022)   [employer] of Occupational Health - Occupational Stress Questionnaire    Feeling of Stress : Only a little  Social Connections: Socially Integrated (11/08/2022)   Social Connection and Isolation Panel [NHANES]    Frequency of Communication with Friends and Family: Three times a week    Frequency of Social Gatherings with Friends  and Family: Twice a week    Attends Religious Services: More than 4 times per year    Active Member of [employer] or Organizations: Yes    Attends Banker Meetings: More than 4 times per year    Marital Status: Married  Catering manager Violence: Not At Risk (07/17/2023)  Humiliation, Afraid, Rape, and Kick questionnaire    Fear of Current or Ex-Partner: No    Emotionally Abused: No    Physically Abused: No    Sexually Abused: No   Family History  Problem Relation Age of Onset   Diabetes Brother    Colon cancer Brother 54       dx in 2018   Hypertension Sister    Prostate cancer Neg Hx    Stroke Neg Hx    CAD Neg Hx    BP (!) 146/72   Pulse (!) 57   Ht 6' (1.829 m)   Wt 130.1 kg (286 lb 12.8 oz)   SpO2 (!) 57%   BMI 38.90 kg/m   Wt Readings from Last 3 Encounters:  01/24/24 130.1 kg (286 lb 12.8 oz)  12/27/23 127 kg (280 lb)  11/12/23 127.1 kg (280 lb 4 oz)   PHYSICAL EXAM: General:  Well appearing. No resp difficulty HEENT: normal Neck: supple. no JVD. Carotids 2+ bilat; no bruits. No lymphadenopathy or thryomegaly appreciated. Cor: PMI nondisplaced. Regular rate & rhythm. No rubs, gallops or murmurs. Lungs: clear Abdomen: obesesoft, nontender, nondistended. No hepatosplenomegaly. No bruits or masses. Good bowel sounds. Extremities: no cyanosis, clubbing, rash, edema Neuro: alert & orientedx3, cranial nerves grossly intact. moves all 4 extremities w/o difficulty. Affect pleasant  ECG (personally reviewed): SB 54 bpm, 1AVB PR 264 msec Personally reviewed   ASSESSMENT & PLAN:  1. Transient LV dysfunction -  Echo (1/21): EF 45 to 50% with new mid apical anterior WMA.  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 - Continue carvedilol 12.5 mg bid - Check labs in 2 weeks with increased spiro  2.  Essential HTN -BP running a bit high. Changes as above   3.  Stage IV T-cell lymphoma - Followed by oncology.  - Remains in remission   4. HL - Continue statin - Followed by PCP  Arvilla Meres, MD  3:23 PM

## 2024-01-24 NOTE — Patient Instructions (Signed)
 Medication Changes:  INCREASE Spironolactone to 25 mg (1 tab) Daily  STOP Potassium  Lab Work:  Your physician recommends that you return for lab work in: 2 weeks   Special Instructions // Education:  Do the following things EVERYDAY: Weigh yourself in the morning before breakfast. Write it down and keep it in a log. Take your medicines as prescribed Eat low salt foods--Limit salt (sodium) to 2000 mg per day.  Stay as active as you can everyday Limit all fluids for the day to less than 2 liters   Follow-Up in: 1 year (April 2026), **PLEASE CALL OUR OFFICE IN FEBRUARY TO SCHEDULE THIS APPOINTMENT    At the [facility], you and your health needs are our priority. We have a designated team specialized in the treatment of Heart Failure. This Care Team includes your primary Heart Failure Specialized Cardiologist (physician), Advanced Practice Providers (APPs- Physician Assistants and Nurse Practitioners), and Pharmacist who all work together to provide you with the care you need, when you need it.   You may see any of the following providers on your designated Care Team at your next follow up:  Dr. Arvilla Meres Dr. Marca Ancona Dr. Dorthula Nettles Dr. Theresia Bough Tonye Becket, NP Robbie Lis, [place] [facility] [place], [place] Brynda Peon, NP [place] Lee, NP Karle Plumber, PharmD   Please be sure to bring in all your medications bottles to every appointment.   Need to Contact us:  If you have any questions or concerns before your next appointment please send [place] a message through [place] or call our office at [phone].    TO LEAVE A MESSAGE FOR THE NURSE SELECT OPTION 2, PLEASE LEAVE A MESSAGE INCLUDING: YOUR NAME DATE OF BIRTH CALL BACK NUMBER REASON FOR CALL**this is important as we prioritize the call backs  YOU WILL RECEIVE A CALL BACK THE SAME DAY AS LONG AS YOU CALL BEFORE 4:00 PM

## 2024-02-07 ENCOUNTER — Other Ambulatory Visit (HOSPITAL_COMMUNITY)

## 2024-02-07 ENCOUNTER — Ambulatory Visit (HOSPITAL_COMMUNITY)
Admission: RE | Admit: 2024-02-07 | Discharge: 2024-02-07 | Disposition: A | Discharge status: Home / Self Care | Source: Ambulatory Visit | Attending: Cardiology | Admitting: Cardiology

## 2024-02-07 DIAGNOSIS — I5022 Chronic systolic (congestive) heart failure: Secondary | ICD-10-CM | POA: Insufficient documentation

## 2024-02-07 LAB — BASIC METABOLIC PANEL WITH GFR
Anion gap: 11 (ref 5–15)
BUN: 17 mg/dL (ref 8–23)
CO2: 23 mmol/L (ref 22–32)
Calcium: 9.1 mg/dL (ref 8.9–10.3)
Chloride: 104 mmol/L (ref 98–111)
Creatinine, Ser: 1.12 mg/dL (ref 0.61–1.24)
GFR, Estimated: >60 mL/min (ref >60)
Glucose, Bld: 126 mg/dL — ABNORMAL HIGH (ref 70–99)
Potassium: 4.1 mmol/L (ref 3.5–5.1)
Sodium: 138 mmol/L (ref 135–145)

## 2024-02-12 ENCOUNTER — Ambulatory Visit: Payer: PPO | Admitting: Internal Medicine

## 2024-02-12 ENCOUNTER — Encounter: Payer: Self-pay | Admitting: Internal Medicine

## 2024-02-12 VITALS — BP 126/78 | HR 50 | Temp 98.1°F | Resp 18 | Ht 72.0 in | Wt 279.5 lb

## 2024-02-12 DIAGNOSIS — Z7984 Long term (current) use of oral hypoglycemic drugs: Secondary | ICD-10-CM

## 2024-02-12 DIAGNOSIS — E1169 Type 2 diabetes mellitus with other specified complication: Secondary | ICD-10-CM | POA: Diagnosis not present

## 2024-02-12 DIAGNOSIS — E785 Hyperlipidemia, unspecified: Secondary | ICD-10-CM | POA: Diagnosis not present

## 2024-02-12 DIAGNOSIS — Z0001 Encounter for general adult medical examination with abnormal findings: Secondary | ICD-10-CM

## 2024-02-12 DIAGNOSIS — I10 Essential (primary) hypertension: Secondary | ICD-10-CM

## 2024-02-12 DIAGNOSIS — Z0000 Encounter for general adult medical examination without abnormal findings: Secondary | ICD-10-CM

## 2024-02-12 LAB — HEMOGLOBIN A1C: Hgb A1c MFr Bld: 5.9 % (ref 4.6–6.5)

## 2024-02-12 LAB — AST: AST: 11 U/L (ref 0–37)

## 2024-02-12 LAB — ALT: ALT: 13 U/L (ref 0–53)

## 2024-02-12 NOTE — Progress Notes (Signed)
 Subjective:    Patient ID: [name] [name], male    DOB: 02-Oct-1941, 83 y.o.   MRN: [id number]  DOS:  02/12/2024 Type of visit - description: CPX  Here for CPX Since the last office visit is doing well. Saw cardiology, note reviewed. Denies chest pain or difficulty breathing.  No edema.  No LUTS.   Review of Systems  Other than above, a 14 point review of systems is negative     Past Medical History:  Diagnosis Date   Arthritis    Complication of anesthesia     had spinal with knee surgeries  -"heart rate too low" for general   Detached retina, left    L eye normal vision, reports that he has had a retina procedure in a doctor's office at [facility]    Diabetes mellitus without complication (HCC)    Dysrhythmia    slight irregular rate   GERD (gastroesophageal reflux disease)    hx no problems now   H/O echocardiogram 2014   History of hiatal hernia    ?20 yrs ago   Hyperopia 2016   Hypertension    Lumbar stenosis with neurogenic claudication    Lymphoma (HCC)    MG, ocular (myasthenia gravis) (HCC)    ? of L eye,    Pneumonia    Prediabetes    Presbyopia OU 2016    Past Surgical History:  Procedure Laterality Date   COLONOSCOPY     HERNIA REPAIR     bilateral inguinal hernia   HERNIA REPAIR     umbilical   IR IMAGING GUIDED PORT INSERTION  09/16/2019   IR REMOVAL TUN ACCESS W/ PORT W/O FL MOD SED  03/04/2020   JOINT REPLACEMENT  2009   right knee   LUMBAR LAMINECTOMY/DECOMPRESSION MICRODISCECTOMY N/A 06/06/2017   Procedure: Decompression L3-5, insitu fusion L3-5 ;  Surgeon: Mort Ards, MD;  Location: [facility] OR;  Service: Orthopedics;  Laterality: N/A;   LYMPH NODE BIOPSY Right 09/01/2019   Procedure: CERVICAL LYMPH NODE BIOPSY;  Surgeon: Lenton Rail, MD;  Location: [facility] OR;  Service: ENT;  Laterality: Right;   MIDDLE EAR SURGERY Right    ear -patched hole in ear drum   RIGHT/LEFT HEART CATH AND CORONARY ANGIOGRAPHY N/A 11/11/2019   Procedure: RIGHT/LEFT HEART  CATH AND CORONARY ANGIOGRAPHY;  Surgeon: Mardell Shade, MD;  Location: [facility] INVASIVE CV LAB;  Service: Cardiovascular;  Laterality: N/A;   TOTAL HIP ARTHROPLASTY Left 07/17/2023   Procedure: TOTAL HIP ARTHROPLASTY ANTERIOR APPROACH;  Surgeon: Claiborne Crew, MD;  Location: [facility] ORS;  Service: Orthopedics;  Laterality: Left;   TOTAL KNEE ARTHROPLASTY  09/10/2012   Procedure: TOTAL KNEE ARTHROPLASTY;  Surgeon: Florencia Hunter, MD;  Location: [facility] ORS;  Service: Orthopedics;  Laterality: Left;   TOTAL SHOULDER ARTHROPLASTY Right 06/22/2016   Procedure: RIGHT TOTAL SHOULDER ARTHROPLASTY;  Surgeon: Ellard Gunning, MD;  Location: [facility] OR;  Service: Orthopedics;  Laterality: Right;   Social History   Socioeconomic History   Marital status: Married    Spouse name: Not on file   Number of children: 4   Years of education: Not on file   Highest education level: Not on file  Occupational History   Occupation: Systems analyst company-- retired 2010  Tobacco Use   Smoking status: Former    Current packs/day: 0.00    Average packs/day: 0.5 packs/day for 2.0 years (1.0 ttl pk-yrs)    Types: Cigarettes    Start date: 06/09/1964    Quit date:  06/09/1966    Years since quitting: 57.7   Smokeless tobacco: Never  Vaping Use   Vaping status: Never Used  Substance and Sexual Activity   Alcohol use: No    Alcohol/week: 0.0 standard drinks of alcohol    Comment: none   Drug use: No   Sexual activity: Not Currently    Partners: Female  Other Topics Concern   Not on file  Social History Narrative   married, 2nd wife at a nursing home, memory unit   Lives alone , lives independently     2 living children, lost 2 kids     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   Social Drivers of Health   Financial Resource Strain: Low Risk  (03/21/2023)   Overall Financial Resource Strain (CARDIA)    Difficulty of Paying Living Expenses: Not hard at all  Food Insecurity: No Food Insecurity (07/17/2023)   Hunger Vital  Sign    Worried About Running Out of Food in the Last Year: Never true    Ran Out of Food in the Last Year: Never true  Transportation Needs: No Transportation Needs (07/17/2023)   PRAPARE - Administrator, Civil Service (Medical): No    Lack of Transportation (Non-Medical): No  Physical Activity: Insufficiently Active (03/10/2022)   Exercise Vital Sign    Days of Exercise per Week: 7 days    Minutes of Exercise per Session: 20 min  Stress: No Stress Concern Present (03/10/2022)   [employer] of Occupational Health - Occupational Stress Questionnaire    Feeling of Stress : Only a little  Social Connections: Socially Integrated (11/08/2022)   Social Connection and Isolation Panel [NHANES]    Frequency of Communication with Friends and Family: Three times a week    Frequency of Social Gatherings with Friends and Family: Twice a week    Attends Religious Services: More than 4 times per year    Active Member of [employer] or Organizations: Yes    Attends Engineer, structural: More than 4 times per year    Marital Status: Married  Catering manager Violence: Not At Risk (07/17/2023)   Humiliation, Afraid, Rape, and Kick questionnaire    Fear of Current or Ex-Partner: No    Emotionally Abused: No    Physically Abused: No    Sexually Abused: No     Current Outpatient Medications  Medication Instructions   allopurinol  (ZYLOPRIM ) 300 mg, Oral, Daily   aspirin  EC 81 mg, Daily   carvedilol  (COREG ) 12.5 mg, Oral, 2 times daily   cyanocobalamin  (VITAMIN B12) 500 mcg, Oral, Daily   diphenhydramine -acetaminophen  (TYLENOL  PM) 25-500 MG TABS tablet 1 tablet, At bedtime PRN   Fish Oil 1,000 mg, Daily   glucose blood (ONETOUCH VERIO) test strip Use to check blood glucose once a day (Dx: 11.9 - type 2 DM)   losartan  (COZAAR ) 100 mg, Oral, Daily   metFORMIN  (GLUCOPHAGE ) 500 mg, Oral, Daily with breakfast   polyethylene glycol (MIRALAX  / GLYCOLAX ) 17 g, Oral, 2 times daily    rosuvastatin  (CRESTOR ) 10 MG tablet TAKE 1 TABLET BY MOUTH EVERY DAY   spironolactone  (ALDACTONE ) 25 mg, Oral, Daily       Objective:   Physical Exam BP 126/78   Pulse (!) 50   Temp 98.1 F (36.7 C) (Oral)   Resp 18   Ht 6' (1.829 m)   Wt 279 lb 8 oz (126.8 kg)   SpO2 97%   BMI 37.91 kg/m  General: Well developed, NAD, BMI noted Neck: No  thyromegaly  HEENT:  Normocephalic . Face symmetric, atraumatic Lungs:  CTA B Normal respiratory effort, no intercostal retractions, no accessory muscle use. Heart: RRR,  no murmur.  Abdomen:  Not distended, soft, non-tender. No rebound or rigidity.   Lower extremities: no pretibial edema bilaterally  Skin: Exposed areas without rash. Not pale. Not jaundice Neurologic:  alert & oriented X3.  Speech normal, gait appropriate for age and unassisted Strength symmetric and appropriate for age.  Psych: Cognition and judgment appear intact.  Cooperative with normal attention span and concentration.  Behavior appropriate. No anxious or depressed appearing.     Assessment    Assessment DM + Neuropathy Dx 01-2021 HTN Hyperlipidemia Gout  Morbid obesity Snoring  CV: -- Palpitations, PVCs, h/o bradycardia,s/p. Dr Jillene Motley day monitor, echo 2014: Normal LV, some LVH  --New onset acute syst CHF 10/2019>>  S/p catheterization 11/11/2019: No CAD: Normal EF. 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  RSV.  COVID booster if not done recently Colon cancer screening: Colonoscopy:  Adenomatous Polyp (12/17/2006) Cscope again 4-13 was told 3 years  Cscope 08-2015: polyps Colonoscopy 08-2018 Cscope 03/2022, Tubular adenoma polyps, was told most likely no f/u d/t age Prostate cancer screening:   no  further screening -Labs: AST ALT A1c -Diet and exercise: discussed  -Rec to bring a healthcare power of attorney Other issues addressed today: DM: On metformin , check A1c. HTN: Well-controlled, occasionally at home BP is in the 150s, recommend to continue checking and let us  know if BP is consistently elevated.  Current treatment: Carvedilol , Aldactone , losartan .  Last BMP okay. Hyperlipidemia: Well-controlled on rosuvastatin .  Check AST ALT Gout: No recent events, last uric acid very good.Cont allop. Transient LV dysfunction.  01/24/2024: Saw cardiology, spironolactone  dose increased, KCL stopped, f/u BMP ok, other meds the same ; next visit 1 year. RTC 5 to 6 months

## 2024-02-12 NOTE — Patient Instructions (Addendum)
 INSTRUCTIONS  FOR TODAY  Vaccines I recommend: Covid booster RSV vaccine Flu shot every fall   Check the  blood pressure regularly Blood pressure goal:  between 110/65 and  135/85. If it is consistently higher or lower, let me know     GO TO THE LAB : Get the blood work     Next office visit for a checkup in 5 to 6 months.  Sooner if needed Please make an appointment before you leave today        "Health Care Power of attorney" (Also know as a  "Living will" or  Advance care planning documents)  If you already have a living will or healthcare power of attorney, is recommended you bring the copy to be scanned in your chart.   The document will be available to all the doctors you see in the system.  If you are over 74 y/o and don't have the document, please read:  Advance care planning is a process that supports adults in  understanding and sharing their preferences regarding future medical care.  The patient's preferences are recorded in documents called Advance Directives and the can be modified at any time while the patient is in full mental capacity.     More information at: [email or web address]

## 2024-02-13 ENCOUNTER — Encounter: Payer: Self-pay | Admitting: Internal Medicine

## 2024-02-13 NOTE — Assessment & Plan Note (Signed)
 Here for CPX - Td 2019 - PNM 20: 2023 ; s/p  zostavax and shingrix -Vaccines are recommended: Flu shot every fall.  RSV.  COVID booster if not done recently Colon cancer screening: Colonoscopy:  Adenomatous Polyp (12/17/2006) Cscope again 4-13 was told 3 years  Cscope 08-2015: polyps Colonoscopy 08-2018 Cscope 03/2022, Tubular adenoma polyps, was told most likely no f/u d/t age Prostate cancer screening:   no further screening -Labs: AST ALT A1c -Diet and exercise: discussed  -Rec to bring a healthcare power of attorney

## 2024-02-13 NOTE — Assessment & Plan Note (Signed)
 Here for CPX  Other issues addressed today: DM: On metformin , check A1c. HTN: Well-controlled, occasionally at home BP is in the 150s, recommend to continue checking and let us  know if BP is consistently elevated.  Current treatment: Carvedilol , Aldactone , losartan .  Last BMP okay. Hyperlipidemia: Well-controlled on rosuvastatin .  Check AST ALT Gout: No recent events, last uric acid very good.Cont allop. Transient LV dysfunction.  01/24/2024: Saw cardiology, spironolactone  dose increased, KCL stopped, f/u BMP ok, other meds the same ; next visit 1 year. RTC 5 to 6 months

## 2024-03-28 ENCOUNTER — Ambulatory Visit (INDEPENDENT_AMBULATORY_CARE_PROVIDER_SITE_OTHER): Admitting: Podiatry

## 2024-03-28 ENCOUNTER — Encounter: Payer: Self-pay | Admitting: Podiatry

## 2024-03-28 DIAGNOSIS — M79674 Pain in right toe(s): Secondary | ICD-10-CM

## 2024-03-28 DIAGNOSIS — L608 Other nail disorders: Secondary | ICD-10-CM | POA: Diagnosis not present

## 2024-03-28 DIAGNOSIS — M79675 Pain in left toe(s): Secondary | ICD-10-CM | POA: Diagnosis not present

## 2024-03-28 DIAGNOSIS — E1169 Type 2 diabetes mellitus with other specified complication: Secondary | ICD-10-CM

## 2024-03-28 DIAGNOSIS — B351 Tinea unguium: Secondary | ICD-10-CM | POA: Diagnosis not present

## 2024-03-28 NOTE — Progress Notes (Signed)
This patient returns to my office for at risk foot care.  This patient requires this care by a professional since this patient will be at risk due to having diabetes type 2. This patient is having problems with his left knee and hip.  This patient is unable to cut nails himself since the patient cannot reach his nails.These nails are painful walking and wearing shoes.  This patient presents for at risk foot care today.  General Appearance  Alert, conversant and in no acute stress.  Vascular  Dorsalis pedis  are palpable  bilaterally. Posterior tibial pulses are absent  B/L. Capillary return is within normal limits  bilaterally. Cold feet  Bilaterally. Absent hair  B/L.  Neurologic  Senn-Weinstein monofilament wire test within normal limits/diminished   bilaterally. Muscle power within normal limits bilaterally.  Nails Thick disfigured discolored nails with subungual debris  Hallux nails. No evidence of bacterial infection or drainage bilaterally.  Orthopedic  No limitations of motion  feet .  No crepitus or effusions noted.  No bony pathology or digital deformities noted.  HAV  B/L.  Skin  normotropic skin with no porokeratosis noted bilaterally.  No signs of infections or ulcers noted.     Onychomycosis  Pain in right toes  Pain in left toes  Consent was obtained for treatment procedures.   Mechanical debridement of nails 1-5  bilaterally performed with a nail nipper.  Filed with dremel without incident.    Return office visit     3 months.                Told patient to return for periodic foot care and evaluation due to potential at risk complications.   Nieshia Larmon DPM  

## 2024-04-02 ENCOUNTER — Other Ambulatory Visit: Payer: Self-pay

## 2024-04-02 DIAGNOSIS — C8440 Peripheral T-cell lymphoma, not classified, unspecified site: Secondary | ICD-10-CM

## 2024-04-03 ENCOUNTER — Other Ambulatory Visit (HOSPITAL_COMMUNITY): Payer: Self-pay | Admitting: Internal Medicine

## 2024-04-03 ENCOUNTER — Other Ambulatory Visit: Payer: Self-pay | Admitting: Internal Medicine

## 2024-04-03 ENCOUNTER — Encounter: Payer: Self-pay | Admitting: Hematology & Oncology

## 2024-04-03 ENCOUNTER — Inpatient Hospital Stay: Payer: PPO | Attending: Hematology & Oncology

## 2024-04-03 ENCOUNTER — Inpatient Hospital Stay: Payer: PPO | Admitting: Hematology & Oncology

## 2024-04-03 VITALS — BP 145/64 | HR 55 | Temp 98.4°F | Resp 20 | Ht 72.0 in | Wt 288.1 lb

## 2024-04-03 DIAGNOSIS — E785 Hyperlipidemia, unspecified: Secondary | ICD-10-CM | POA: Diagnosis not present

## 2024-04-03 DIAGNOSIS — I152 Hypertension secondary to endocrine disorders: Secondary | ICD-10-CM

## 2024-04-03 DIAGNOSIS — C8440 Peripheral T-cell lymphoma, not classified, unspecified site: Secondary | ICD-10-CM

## 2024-04-03 DIAGNOSIS — E1159 Type 2 diabetes mellitus with other circulatory complications: Secondary | ICD-10-CM | POA: Diagnosis not present

## 2024-04-03 DIAGNOSIS — C8651 Angioimmunoblastic t-cell lymphoma, in remission: Secondary | ICD-10-CM | POA: Insufficient documentation

## 2024-04-03 DIAGNOSIS — E1169 Type 2 diabetes mellitus with other specified complication: Secondary | ICD-10-CM

## 2024-04-03 DIAGNOSIS — Z79899 Other long term (current) drug therapy: Secondary | ICD-10-CM | POA: Insufficient documentation

## 2024-04-03 LAB — CMP (CANCER CENTER ONLY)
ALT: 15 U/L (ref 0–44)
AST: 12 U/L — ABNORMAL LOW (ref 15–41)
Albumin: 4.5 g/dL (ref 3.5–5.0)
Alkaline Phosphatase: 61 U/L (ref 38–126)
Anion gap: 8 (ref 5–15)
BUN: 16 mg/dL (ref 8–23)
CO2: 29 mmol/L (ref 22–32)
Calcium: 9.3 mg/dL (ref 8.9–10.3)
Chloride: 104 mmol/L (ref 98–111)
Creatinine: 1.15 mg/dL (ref 0.61–1.24)
GFR, Estimated: >60 mL/min (ref >60)
Glucose, Bld: 117 mg/dL — ABNORMAL HIGH (ref 70–99)
Potassium: 4.8 mmol/L (ref 3.5–5.1)
Sodium: 141 mmol/L (ref 135–145)
Total Bilirubin: 0.6 mg/dL (ref 0.0–1.2)
Total Protein: 6.7 g/dL (ref 6.5–8.1)

## 2024-04-03 LAB — CBC WITH DIFFERENTIAL (CANCER CENTER ONLY)
Abs Immature Granulocytes: 0.05 10*3/uL (ref 0.00–0.07)
Basophils Absolute: 0 10*3/uL (ref 0.0–0.1)
Basophils Relative: 1 %
Eosinophils Absolute: 0.3 10*3/uL (ref 0.0–0.5)
Eosinophils Relative: 4 %
HCT: 39.6 % (ref 39.0–52.0)
Hemoglobin: 13.3 g/dL (ref 13.0–17.0)
Immature Granulocytes: 1 %
Lymphocytes Relative: 16 %
Lymphs Abs: 1.1 10*3/uL (ref 0.7–4.0)
MCH: 31.3 pg (ref 26.0–34.0)
MCHC: 33.6 g/dL (ref 30.0–36.0)
MCV: 93.2 fL (ref 80.0–100.0)
Monocytes Absolute: 0.7 10*3/uL (ref 0.1–1.0)
Monocytes Relative: 10 %
Neutro Abs: 5 10*3/uL (ref 1.7–7.7)
Neutrophils Relative %: 68 %
Platelet Count: 188 10*3/uL (ref 150–400)
RBC: 4.25 MIL/uL (ref 4.22–5.81)
RDW: 13.2 % (ref 11.5–15.5)
WBC Count: 7.2 10*3/uL (ref 4.0–10.5)
nRBC: 0 % (ref 0.0–0.2)

## 2024-04-03 LAB — LACTATE DEHYDROGENASE: LDH: 141 U/L (ref 98–192)

## 2024-04-03 MED ORDER — SPIRONOLACTONE 25 MG PO TABS: 12.5000 mg | ORAL_TABLET | Freq: Every day | ORAL | 3 refills | Status: AC

## 2024-04-03 NOTE — Progress Notes (Signed)
 BP remains elevated, 145/64, has not taken Spironolactone  in 3 weeks. Is going to see PCP soon.

## 2024-04-03 NOTE — Progress Notes (Signed)
 This man Hematology and Oncology Follow Up Visit  [name] [name] [id number] 09-08-1941 83 y.o. 04/03/2024   Principle Diagnosis:  Stage IV angioimmunoblastic T-cell NHL  Current Therapy:   S/p CHP + Adcetris  x 6 cycles -- completed on 01/19/2020     Interim History:  [name] is back for follow-up.  It has been 6 months since we last saw him.  His main problem has been his poor wife.  She is still in the nursing home.  She has been there for 4 years.  Her dementia continues to worsen.  There are times that she does not recognize them.  He is doing quite well with the left hip surgery.  He had this back in September.  He does not have any problems with knee pain.  He still takes little bit of Celebrex .  He has had no change in bowel or bladder habits.  He has had no cough or shortness of breath.  He has had no bleeding.  He has had no fever.  There is been no problems with COVID or Influenza.  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  Overall, I would say that his performance status about ECOG 1.    Medications:  Current Outpatient Medications:    allopurinol  (ZYLOPRIM ) 300 MG tablet, TAKE 1 TABLET BY MOUTH EVERY DAY, Disp: 90 tablet, Rfl: 1   aspirin  EC 81 MG tablet, Take 81 mg by mouth daily. Swallow whole., Disp: , Rfl:    carvedilol  (COREG ) 12.5 MG tablet, Take 1 tablet (12.5 mg total) by mouth 2 (two) times daily., Disp: 180 tablet, Rfl: 1   celecoxib  (CELEBREX ) 200 MG capsule, Take 200 mg by mouth daily., Disp: , Rfl:    cyanocobalamin  (VITAMIN B12) 500 MCG tablet, Take 1 tablet (500 mcg total) by mouth daily., Disp: , Rfl:    diphenhydramine -acetaminophen  (TYLENOL  PM) 25-500 MG TABS tablet, Take 1 tablet by mouth at bedtime as needed., Disp: , Rfl:    losartan  (COZAAR ) 100 MG tablet, Take 1 tablet (100 mg total) by mouth daily., Disp: 90 tablet, Rfl: 1   metFORMIN  (GLUCOPHAGE ) 500 MG tablet, Take 1 tablet (500 mg total) by mouth daily  with breakfast., Disp: 90 tablet, Rfl: 1   Omega-3 Fatty Acids (FISH OIL) 1000 MG CAPS, Take 1,000 mg by mouth daily., Disp: , Rfl:    rosuvastatin  (CRESTOR ) 10 MG tablet, TAKE 1 TABLET BY MOUTH EVERY DAY, Disp: 90 tablet, Rfl: 3   glucose blood (ONETOUCH VERIO) test strip, Use to check blood glucose once a day (Dx: 11.9 - type 2 DM) (Patient not taking: Reported on 04/03/2024), Disp: 100 each, Rfl: 3   polyethylene glycol (MIRALAX  / GLYCOLAX ) 17 g packet, Take 17 g by mouth 2 (two) times daily. (Patient not taking: Reported on 04/03/2024), Disp: 14 each, Rfl: 0   spironolactone  (ALDACTONE ) 25 MG tablet, Take 0.5 tablets (12.5 mg total) by mouth daily., Disp: 90 tablet, Rfl: 3  Allergies:  Allergies  Allergen Reactions   Doxycycline  Rash    Past Medical History, Surgical history, Social history, and Family History were reviewed and updated.  Review of Systems: Review of Systems  Constitutional: Negative.   HENT:  Negative.    Eyes: Negative.   Respiratory:  Positive for shortness of breath.   Cardiovascular:  Positive for leg swelling.  Gastrointestinal:  Positive for nausea.  Endocrine: Negative.   Genitourinary: Negative.    Musculoskeletal:  Positive for arthralgias.  Skin: Negative.   Neurological: Negative.   Hematological: Negative.   Psychiatric/Behavioral: Negative.      Physical Exam:  height is 6' (1.829 m) and weight is 288 lb 1.3 oz (130.7 kg). His oral temperature is 98.4 F (36.9 C). His blood pressure is 145/64 (abnormal) and his pulse is 55 (abnormal). His respiration is 20 and oxygen saturation is 99%.   Wt Readings from Last 3 Encounters:  04/03/24 288 lb 1.3 oz (130.7 kg)  02/12/24 279 lb 8 oz (126.8 kg)  01/24/24 286 lb 12.8 oz (130.1 kg)    Physical Exam Vitals reviewed.  HENT:     Head: Normocephalic and atraumatic.   Eyes:     Pupils: Pupils are equal, round, and reactive to light.    Cardiovascular:     Rate and Rhythm: Normal rate and regular  rhythm.     Heart sounds: Normal heart sounds.  Pulmonary:     Effort: Pulmonary effort is normal.     Breath sounds: Normal breath sounds.  Abdominal:     General: Bowel sounds are normal.     Palpations: Abdomen is soft.   Musculoskeletal:        General: No tenderness or deformity. Normal range of motion.     Cervical back: Normal range of motion.  Lymphadenopathy:     Cervical: No cervical adenopathy.   Skin:    General: Skin is warm and dry.     Findings: No erythema or rash.   Neurological:     Mental Status: He is alert and oriented to person, place, and time.   Psychiatric:        Behavior: Behavior normal.        Thought Content: Thought content normal.        Judgment: Judgment normal.      Lab Results  Component Value Date   WBC 7.2 04/03/2024   HGB 13.3 04/03/2024   HCT 39.6 04/03/2024   MCV 93.2 04/03/2024   PLT 188 04/03/2024     Chemistry      Component Value Date/Time   NA 141 04/03/2024 0827   K 4.8 04/03/2024 0827   CL 104 04/03/2024 0827   CO2 29 04/03/2024 0827   BUN 16 04/03/2024 0827   CREATININE 1.15 04/03/2024 0827   CREATININE 1.24 (H) 09/13/2022 1537      Component Value Date/Time   CALCIUM  9.3 04/03/2024 0827   ALKPHOS 61 04/03/2024 0827   AST 12 (L) 04/03/2024 0827   ALT 15 04/03/2024 0827   BILITOT 0.6 04/03/2024 0827      Impression and Plan: [name] is a very nice 83 year old white male.  He had stage IV T-cell lymphoma.  This was an angioimmunoblastic lymphoma.  He was treated with chemotherapy and targeted therapy.  He completed 6 cycles of treatment back on 01/19/2020.  He went into remission.  He is still in remission.  I just feel bad for his poor wife.  I know she is suffering from the dementia.  I know he is trying to his best to help her out.  Again I do not see any problems with this past lymphoma.  Everything is looking fantastic.  He has been in remission now for 4 years.  He still has a follow-up with us .  We  will plan to get him back in 6 months.   Ivor Mars, MD 6/12/20259:47 AM

## 2024-04-11 ENCOUNTER — Ambulatory Visit: Admitting: *Deleted

## 2024-04-11 VITALS — Ht 72.0 in | Wt 275.0 lb

## 2024-04-11 DIAGNOSIS — Z0000 Encounter for general adult medical examination without abnormal findings: Secondary | ICD-10-CM | POA: Diagnosis not present

## 2024-04-11 NOTE — Progress Notes (Signed)
 Subjective:   [name] [name] is a 83 y.o. who presents for a Medicare Wellness preventive visit.  As a reminder, Annual Wellness Visits don't include a physical exam, and some assessments may be limited, especially if this visit is performed virtually. We may recommend an in-person follow-up visit with your provider if needed.  Visit Complete: Virtual I connected with  [name] [name] on 04/11/24 by a audio enabled telemedicine application and verified that I am speaking with the correct person using two identifiers.  Patient Location: Home  Provider Location: Office/Clinic  I discussed the limitations of evaluation and management by telemedicine. The patient expressed understanding and agreed to proceed.  Vital Signs: Because this visit was a virtual/telehealth visit, some criteria may be missing or patient reported. Any vitals not documented were not able to be obtained and vitals that have been documented are patient reported.  VideoDeclined- This patient declined Librarian, academic. Therefore the visit was completed with audio only.  Persons Participating in Visit: Patient.  AWV Questionnaire: No: Patient Medicare AWV questionnaire was not completed prior to this visit.  Cardiac Risk Factors include: advanced age (>37men, >25 women);dyslipidemia;hypertension;male gender;Other (see comment);obesity (BMI >30kg/m2), Risk factor comments: CHF     Objective:    Today's Vitals   04/11/24 1258  Weight: 275 lb (124.7 kg)  Height: 6' (1.829 m)   Body mass index is 37.3 kg/m.     04/03/2024    9:01 AM 10/04/2023    9:25 AM 07/17/2023    7:06 AM 07/05/2023    8:58 AM 04/04/2023    9:53 AM 03/21/2023   10:23 AM 10/04/2022    9:28 AM  Advanced Directives  Does Patient Have a Medical Advance Directive? Yes Yes Yes Yes Yes Yes Yes  Type of Social research officer, government Power of [employer] Power  of [employer] Power of Attorney Healthcare Power of Attorney Living will  Does patient want to make changes to medical advance directive?   No - Patient declined    No - Patient declined  Copy of Healthcare Power of Attorney in Chart? No - copy requested No - copy requested No - copy requested No - copy requested No - copy requested No - copy requested   Would patient like information on creating a medical advance directive? No - Patient declined No - Patient declined   No - Patient declined      Current Medications (verified) Outpatient Encounter Medications as of 04/11/2024  Medication Sig   allopurinol  (ZYLOPRIM ) 300 MG tablet TAKE 1 TABLET BY MOUTH EVERY DAY   aspirin  EC 81 MG tablet Take 81 mg by mouth daily. Swallow whole.   carvedilol  (COREG ) 12.5 MG tablet Take 1 tablet (12.5 mg total) by mouth 2 (two) times daily.   celecoxib  (CELEBREX ) 200 MG capsule Take 200 mg by mouth daily.   cyanocobalamin  (VITAMIN B12) 500 MCG tablet Take 1 tablet (500 mcg total) by mouth daily.   diphenhydramine -acetaminophen  (TYLENOL  PM) 25-500 MG TABS tablet Take 1 tablet by mouth at bedtime.   glucose blood (ONETOUCH VERIO) test strip Use to check blood glucose once a day (Dx: 11.9 - type 2 DM) (Patient not taking: Reported on 04/03/2024)   losartan  (COZAAR ) 100 MG tablet Take 1 tablet (100 mg total) by mouth daily.   metFORMIN  (GLUCOPHAGE ) 500 MG tablet Take 1 tablet (500 mg total) by mouth daily with breakfast.   Omega-3 Fatty Acids (FISH  OIL) 1000 MG CAPS Take 1,000 mg by mouth daily.   rosuvastatin  (CRESTOR ) 10 MG tablet TAKE 1 TABLET BY MOUTH EVERY DAY   spironolactone  (ALDACTONE ) 25 MG tablet Take 0.5 tablets (12.5 mg total) by mouth daily.   [DISCONTINUED] polyethylene glycol (MIRALAX  / GLYCOLAX ) 17 g packet Take 17 g by mouth 2 (two) times daily. (Patient not taking: Reported on 04/03/2024)   No facility-administered encounter medications on file as of 04/11/2024.    Allergies  (verified) Doxycycline    History: Past Medical History:  Diagnosis Date   Arthritis    Complication of anesthesia     had spinal with knee surgeries  -heart rate too low for general   Detached retina, left    L eye normal vision, reports that he has had a retina procedure in a doctor's office at [facility]    Diabetes mellitus without complication (HCC)    Dysrhythmia    slight irregular rate   GERD (gastroesophageal reflux disease)    hx no problems now   H/O echocardiogram 2014   History of hiatal hernia    ?20 yrs ago   Hyperopia 2016   Hypertension    Lumbar stenosis with neurogenic claudication    Lymphoma (HCC)    MG, ocular (myasthenia gravis) (HCC)    ? of L eye,    Pneumonia    Prediabetes    Presbyopia OU 2016   Past Surgical History:  Procedure Laterality Date   COLONOSCOPY     HERNIA REPAIR     bilateral inguinal hernia   HERNIA REPAIR     umbilical   IR IMAGING GUIDED PORT INSERTION  09/16/2019   IR REMOVAL TUN ACCESS W/ PORT W/O FL MOD SED  03/04/2020   JOINT REPLACEMENT  2009   right knee   LUMBAR LAMINECTOMY/DECOMPRESSION MICRODISCECTOMY N/A 06/06/2017   Procedure: Decompression L3-5, insitu fusion L3-5 ;  Surgeon: Mort Ards, MD;  Location: [facility] OR;  Service: Orthopedics;  Laterality: N/A;   LYMPH NODE BIOPSY Right 09/01/2019   Procedure: CERVICAL LYMPH NODE BIOPSY;  Surgeon: Lenton Rail, MD;  Location[facility] OR;  Service: ENT;  Laterality: Right;   MIDDLE EAR SURGERY Right    ear -patched hole in ear drum   RIGHT/LEFT HEART CATH AND CORONARY ANGIOGRAPHY N/A 11/11/2019   Procedure: RIGHT/LEFT HEART CATH AND CORONARY ANGIOGRAPHY;  Surgeon: Mardell Shade, MD;  Location: [facility] INVASIVE CV LAB;  Service: Cardiovascular;  Laterality: N/A;   TOTAL HIP ARTHROPLASTY Left 07/17/2023   Procedure: TOTAL HIP ARTHROPLASTY ANTERIOR APPROACH;  Surgeon: Claiborne Crew, MD;  Location: [facility] ORS;  Service: Orthopedics;  Laterality: Left;   TOTAL KNEE ARTHROPLASTY  09/10/2012    Procedure: TOTAL KNEE ARTHROPLASTY;  Surgeon: Florencia Hunter, MD;  Location: [facility] ORS;  Service: Orthopedics;  Laterality: Left;   TOTAL SHOULDER ARTHROPLASTY Right 06/22/2016   Procedure: RIGHT TOTAL SHOULDER ARTHROPLASTY;  Surgeon: Ellard Gunning, MD;  Location: [facility] OR;  Service: Orthopedics;  Laterality: Right;   Family History  Problem Relation Age of Onset   Hypertension Sister    Alzheimer's disease Sister    Dementia Sister    Diabetes Brother    Colon cancer Brother 74       dx in 2018   Throat cancer Brother    Prostate cancer Neg Hx    Stroke Neg Hx    CAD Neg Hx    Social History   Socioeconomic History   Marital status: Married    Spouse name: Not on  file   Number of children: 4   Years of education: Not on file   Highest education level: Not on file  Occupational History   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  Tobacco Use   Smoking status: Former    Current packs/day: 0.00    Average packs/day: 0.5 packs/day for 2.0 years (1.0 ttl pk-yrs)    Types: Cigarettes    Start date: 06/09/1964    Quit date: 06/09/1966    Years since quitting: 57.8   Smokeless tobacco: Never  Vaping Use   Vaping status: Never Used  Substance and Sexual Activity   Alcohol use: No    Alcohol/week: 0.0 standard drinks of alcohol    Comment: none   Drug use: No   Sexual activity: Not Currently    Partners: Female  Other Topics Concern   Not on file  Social History Narrative   married, 2nd wife at a nursing home, memory unit   Lives alone , lives independently     2 living children, lost 2 kids     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   Social Drivers of Health   Financial Resource Strain: Low Risk  (04/11/2024)   Overall Financial Resource Strain (CARDIA)    Difficulty of Paying Living Expenses: Not hard at all  Food Insecurity: No Food Insecurity (04/11/2024)   Hunger Vital Sign    Worried About Running Out of Food in the Last Year: Never true    Ran Out of Food  in the Last Year: Never true  Transportation Needs: No Transportation Needs (04/11/2024)   PRAPARE - Administrator, Civil Service (Medical): No    Lack of Transportation (Non-Medical): No  Physical Activity: Insufficiently Active (04/11/2024)   Exercise Vital Sign    Days of Exercise per Week: 7 days    Minutes of Exercise per Session: 20 min  Stress: No Stress Concern Present (04/11/2024)   [employer] of Occupational Health - Occupational Stress Questionnaire    Feeling of Stress: Not at all  Social Connections: Socially Integrated (04/11/2024)   Social Connection and Isolation Panel    Frequency of Communication with Friends and Family: More than three times a week    Frequency of Social Gatherings with Friends and Family: More than three times a week    Attends Religious Services: More than 4 times per year    Active Member of [employer] or Organizations: Yes    Attends Engineer, structural: More than 4 times per year    Marital Status: Married    Tobacco Counseling Counseling given: Not Answered    Clinical Intake:  Pre-visit preparation completed: Yes  Pain : No/denies pain     BMI - recorded: 37.3 Nutritional Status: BMI > 30  Obese Nutritional Risks: None Diabetes: No  Lab Results  Component Value Date   HGBA1C 5.9 02/12/2024   HGBA1C 6.3 11/12/2023   HGBA1C 6.1 (H) 07/05/2023     How often do you need to have someone help you when you read instructions, pamphlets, or other written materials from your doctor or pharmacy?: 1 - Never What is the last grade level you completed in school?: Associate Degree  Interpreter Needed?: No  Information entered by :: Susa Engman, CMA   Activities of Daily Living     04/11/2024    1:16 PM 07/17/2023    4:32 PM  In your present state of health, do you have any difficulty performing the following  activities:  Hearing? 0 0  Vision? 0 0  Difficulty concentrating or making decisions? 0 0   Walking or climbing stairs? 0   Dressing or bathing? 0   Doing errands, shopping? 0   Preparing Food and eating ? N   Using the Toilet? N   In the past six months, have you accidently leaked urine? N   Do you have problems with loss of bowel control? N   Managing your Medications? N   Managing your Finances? N   Housekeeping or managing your Housekeeping? N     Patient Care Team: Ezell Hollow, MD as PCP - General [name], Lela Purple, MD as PCP - Cardiology (Cardiology) Lanita Pitman, MD as Consulting Physician (Gastroenterology) Maria Shiner Sherryll Donald, MD as Medical Oncologist (Oncology) Pauline Bos, OD as Referring Physician (Optometry) Cecilie Coffee, RPH-CPP (Pharmacist) Bensimhon, Rheta Celestine, MD as Consulting Physician (Cardiology) Ruffin Cotton, DPM as Consulting Physician (Podiatry)  I have updated your Care Teams any recent Medical Services you may have received from other providers in the past year.     Assessment:   This is a routine wellness examination for Clarke.  Hearing/Vision screen Hearing Screening - Comments:: Denies hearing difficulties.  Vision Screening - Comments:: Wears RX glasses -- up to date with routine eye exams.    Goals Addressed               This Visit's Progress     Patient Stated (pt-stated)        Wants to eat healthier and continue to lose weight.       Depression Screen     04/11/2024    1:10 PM 02/12/2024    9:30 AM 11/12/2023   10:27 AM 07/11/2023    9:02 AM 03/21/2023   10:29 AM 01/08/2023    9:01 AM 09/13/2022    2:46 PM  PHQ 2/9 Scores  PHQ - 2 Score 1 0 0 0 0 0 0    Fall Risk     04/11/2024    1:07 PM 02/12/2024    9:29 AM 11/12/2023   10:27 AM 07/11/2023    9:02 AM 03/21/2023   10:27 AM  Fall Risk   Falls in the past year? 0 0 0 0 0  Number falls in past yr: 0 0 0 0 0  Injury with Fall? 0 0 0 0 0  Risk for fall due to : Orthopedic patient    Orthopedic patient  Follow up Falls evaluation completed Falls evaluation  completed;Education provided Falls evaluation completed;Education provided Falls evaluation completed Falls evaluation completed    MEDICARE RISK AT HOME:  Medicare Risk at Home Any stairs in or around the home?: Yes If so, are there any without handrails?: No Home free of loose throw rugs in walkways, pet beds, electrical cords, etc?: Yes Adequate lighting in your home to reduce risk of falls?: Yes Life alert?: No Use of a cane, walker or w/c?: No (lt hip replacement 06/2023. no longer needs cane/walker) Grab bars in the bathroom?: Yes Shower chair or bench in shower?: Yes Elevated toilet seat or a handicapped toilet?: Yes  TIMED UP AND GO:  Was the test performed?  No, audio  Cognitive Function: 6CIT completed    12/07/2017   10:20 AM 12/06/2016    8:52 AM  MMSE - Mini Mental State Exam  Orientation to time 5  5   Orientation to Place 5  5   Registration 3  3   Attention/ Calculation  3  2   Recall 3  3   Language- name 2 objects 2  2   Language- repeat 1 1  Language- follow 3 step command 3  3   Language- read & follow direction 1  1   Write a sentence 1  1   Copy design 1  1   Total score 28  27      Data saved with a previous flowsheet row definition        04/11/2024    1:17 PM 03/21/2023   10:34 AM 03/10/2022   11:38 AM  6CIT Screen  What Year? 0 points 0 points 0 points  What month? 0 points 0 points 0 points  What time? 0 points 0 points 0 points  Count back from 20 0 points 0 points 0 points  Months in reverse 2 points 4 points 0 points  Repeat phrase 2 points 0 points 6 points  Total Score 4 points 4 points 6 points    Immunizations Immunization History  Administered Date(s) Administered   Fluad Quad(high Dose 65+) 06/20/2019, 10/04/2020, 09/13/2022   Fluad Trivalent(High Dose 65+) 07/11/2023   H1N1 11/12/2008   Influenza Split 07/17/2014, 07/31/2018, 10/18/2021   Influenza Whole 08/04/2010   Influenza, High Dose Seasonal PF 10/18/2021    Influenza,inj,Quad PF,6+ Mos 06/23/2015   Influenza-Unspecified 07/23/2013, 07/07/2016, 07/20/2017   Moderna Sars-Covid-2 Vaccination 03/16/2020, 04/14/2020   PNEUMOCOCCAL CONJUGATE-20 12/14/2021   Pneumococcal Conjugate-13 08/12/2014   Pneumococcal Polysaccharide-23 11/12/2008, 05/28/2019   Td 12/19/2007   Tdap 04/04/2018   Zoster Recombinant(Shingrix) 12/20/2018, 05/12/2019   Zoster, Live 10/24/2011    Screening Tests Health Maintenance  Topic Date Due   Medicare Annual Wellness (AWV)  03/20/2024   COVID-19 Vaccine (3 - Moderna risk series) 04/11/2025 (Originally 05/12/2020)   INFLUENZA VACCINE  05/23/2024   HEMOGLOBIN A1C  08/13/2024   Diabetic kidney evaluation - Urine ACR  11/11/2024   FOOT EXAM  11/11/2024   OPHTHALMOLOGY EXAM  12/05/2024   Diabetic kidney evaluation - eGFR measurement  04/03/2025   DTaP/Tdap/Td (3 - Td or Tdap) 04/04/2028   Pneumococcal Vaccine: 50+ Years  Completed   Zoster Vaccines- Shingrix  Completed   HPV VACCINES  Aged Out   Meningococcal B Vaccine  Aged Out   Colonoscopy  Discontinued   Hepatitis C Screening  Discontinued    Health Maintenance  Health Maintenance Due  Topic Date Due   Medicare Annual Wellness (AWV)  03/20/2024   Health Maintenance Items Addressed: All HM up to date  Additional Screening:  Vision Screening: Recommended annual ophthalmology exams for early detection of glaucoma and other disorders of the eye. Would you like a referral to an eye doctor? No    Dental Screening: Recommended annual dental exams for proper oral hygiene  Community Resource Referral / Chronic Care Management: CRR required this visit?  No   CCM required this visit?  No   Plan:    I have personally reviewed and noted the following in the patient's chart:   Medical and social history Use of alcohol, tobacco or illicit drugs  Current medications and supplements including opioid prescriptions. Patient is not currently taking opioid  prescriptions. Functional ability and status Nutritional status Physical activity Advanced directives List of other physicians Hospitalizations, surgeries, and ER visits in previous 12 months Vitals Screenings to include cognitive, depression, and falls Referrals and appointments  In addition, I have reviewed and discussed with patient certain preventive protocols, quality metrics, and best practice recommendations. A  written personalized care plan for preventive services as well as general preventive health recommendations were provided to patient.   Susa Engman, CMA   04/11/2024   After Visit Summary: (Mail) Due to this being a telephonic visit, the after visit summary with patients personalized plan was offered to patient via mail   Notes: Nothing significant to report at this time.

## 2024-04-11 NOTE — Patient Instructions (Signed)
 [name] , Thank you for taking time out of your busy schedule to complete your Annual Wellness Visit with me. I enjoyed our conversation and look forward to speaking with you again next year. I, as well as your care team,  appreciate your ongoing commitment to your health goals. Please review the following plan we discussed and let me know if I can assist you in the future. Your Game plan/ To Do List     Follow up Visits: Next Medicare AWV with our clinical staff:   04/14/25 1pm, telephone visit.  Next Office Visit with your provider: 07/15/24 9:20  Clinician Recommendations:  Aim for 30 minutes of exercise or brisk walking, 6-8 glasses of water , and 5 servings of fruits and vegetables each day.       This is a list of the screening recommended for you and due dates:  Health Maintenance  Topic Date Due   COVID-19 Vaccine (3 - Moderna risk series) 05/12/2020   Medicare Annual Wellness Visit  03/20/2024   Flu Shot  05/23/2024   Hemoglobin A1C  08/13/2024   Yearly kidney health urinalysis for diabetes  11/11/2024   Complete foot exam   11/11/2024   Eye exam for diabetics  12/05/2024   Yearly kidney function blood test for diabetes  04/03/2025   DTaP/Tdap/Td vaccine (3 - Td or Tdap) 04/04/2028   Pneumococcal Vaccine for age over 58  Completed   Zoster (Shingles) Vaccine  Completed   HPV Vaccine  Aged Out   Meningitis B Vaccine  Aged Out   Colon Cancer Screening  Discontinued   Hepatitis C Screening  Discontinued   Once completed and notarized, you may return a copy of your Advanced Directive(s) by either of the following:  Bring a copy of your health care power of attorney and living will to the office to be added to your chart at your convenience. You can mail a copy to [facility] [address] [place], [place] [address] or email to [email or web address]   Advance Care Planning is important because it:  [x]  Makes sure you receive the medical care that is  consistent with your values, goals, and preferences  [x]  It provides guidance to your family and loved ones and reduces their decisional burden about whether or not they are making the right decisions based on your wishes.  Follow the link provided in your after visit summary or read over the paperwork we have mailed to you to help you started getting your Advance Directives in place. If you need assistance in completing these, please reach out to us  so that we can help you!  See attachments for Healthy Eating Tips.

## 2024-05-30 ENCOUNTER — Other Ambulatory Visit: Payer: Self-pay | Admitting: Hematology & Oncology

## 2024-05-30 DIAGNOSIS — C8440 Peripheral T-cell lymphoma, not classified, unspecified site: Secondary | ICD-10-CM

## 2024-06-27 ENCOUNTER — Encounter: Payer: Self-pay | Admitting: Podiatry

## 2024-06-27 ENCOUNTER — Ambulatory Visit: Admitting: Podiatry

## 2024-06-27 DIAGNOSIS — M79674 Pain in right toe(s): Secondary | ICD-10-CM | POA: Diagnosis not present

## 2024-06-27 DIAGNOSIS — E1169 Type 2 diabetes mellitus with other specified complication: Secondary | ICD-10-CM | POA: Diagnosis not present

## 2024-06-27 DIAGNOSIS — M79675 Pain in left toe(s): Secondary | ICD-10-CM

## 2024-06-27 DIAGNOSIS — B351 Tinea unguium: Secondary | ICD-10-CM

## 2024-06-27 DIAGNOSIS — L608 Other nail disorders: Secondary | ICD-10-CM

## 2024-06-27 NOTE — Progress Notes (Signed)
This patient returns to my office for at risk foot care.  This patient requires this care by a professional since this patient will be at risk due to having diabetes type 2. This patient is having problems with his left knee and hip.  This patient is unable to cut nails himself since the patient cannot reach his nails.These nails are painful walking and wearing shoes.  This patient presents for at risk foot care today.  General Appearance  Alert, conversant and in no acute stress.  Vascular  Dorsalis pedis  are palpable  bilaterally. Posterior tibial pulses are absent  B/L. Capillary return is within normal limits  bilaterally. Cold feet  Bilaterally. Absent hair  B/L.  Neurologic  Senn-Weinstein monofilament wire test within normal limits/diminished   bilaterally. Muscle power within normal limits bilaterally.  Nails Thick disfigured discolored nails with subungual debris  Hallux nails. No evidence of bacterial infection or drainage bilaterally.  Orthopedic  No limitations of motion  feet .  No crepitus or effusions noted.  No bony pathology or digital deformities noted.  HAV  B/L.  Skin  normotropic skin with no porokeratosis noted bilaterally.  No signs of infections or ulcers noted.     Onychomycosis  Pain in right toes  Pain in left toes  Consent was obtained for treatment procedures.   Mechanical debridement of nails 1-5  bilaterally performed with a nail nipper.  Filed with dremel without incident.    Return office visit     3 months.                Told patient to return for periodic foot care and evaluation due to potential at risk complications.   Nieshia Larmon DPM  

## 2024-07-07 DIAGNOSIS — L570 Actinic keratosis: Secondary | ICD-10-CM | POA: Diagnosis not present

## 2024-07-07 DIAGNOSIS — L578 Other skin changes due to chronic exposure to nonionizing radiation: Secondary | ICD-10-CM | POA: Diagnosis not present

## 2024-07-07 DIAGNOSIS — L821 Other seborrheic keratosis: Secondary | ICD-10-CM | POA: Diagnosis not present

## 2024-07-07 DIAGNOSIS — D485 Neoplasm of uncertain behavior of skin: Secondary | ICD-10-CM | POA: Diagnosis not present

## 2024-07-15 ENCOUNTER — Encounter: Payer: Self-pay | Admitting: Internal Medicine

## 2024-07-15 ENCOUNTER — Ambulatory Visit (INDEPENDENT_AMBULATORY_CARE_PROVIDER_SITE_OTHER): Admitting: Internal Medicine

## 2024-07-15 VITALS — BP 136/66 | HR 50 | Temp 98.0°F | Resp 18 | Ht 72.0 in | Wt 295.0 lb

## 2024-07-15 DIAGNOSIS — E1140 Type 2 diabetes mellitus with diabetic neuropathy, unspecified: Secondary | ICD-10-CM

## 2024-07-15 DIAGNOSIS — Z6841 Body Mass Index (BMI) 40.0 and over, adult: Secondary | ICD-10-CM

## 2024-07-15 DIAGNOSIS — E785 Hyperlipidemia, unspecified: Secondary | ICD-10-CM

## 2024-07-15 DIAGNOSIS — Z7984 Long term (current) use of oral hypoglycemic drugs: Secondary | ICD-10-CM

## 2024-07-15 DIAGNOSIS — Z23 Encounter for immunization: Secondary | ICD-10-CM | POA: Diagnosis not present

## 2024-07-15 DIAGNOSIS — E1169 Type 2 diabetes mellitus with other specified complication: Secondary | ICD-10-CM

## 2024-07-15 DIAGNOSIS — I10 Essential (primary) hypertension: Secondary | ICD-10-CM

## 2024-07-15 LAB — HEMOGLOBIN A1C: Hgb A1c MFr Bld: 6.6 % — ABNORMAL HIGH (ref 4.6–6.5)

## 2024-07-15 LAB — LIPID PANEL
Cholesterol: 155 mg/dL (ref 0–200)
HDL: 39.3 mg/dL (ref >39.00)
LDL Cholesterol: 47 mg/dL (ref 0–99)
NonHDL: 115.36
Total CHOL/HDL Ratio: 4
Triglycerides: 341 mg/dL — ABNORMAL HIGH (ref 0.0–149.0)
VLDL: 68.2 mg/dL — ABNORMAL HIGH (ref 0.0–40.0)

## 2024-07-15 LAB — MICROALBUMIN / CREATININE URINE RATIO
Creatinine,U: 69.9 mg/dL
Microalb Creat Ratio: 330.4 mg/g — ABNORMAL HIGH (ref 0.0–30.0)
Microalb, Ur: 23.1 mg/dL — ABNORMAL HIGH (ref 0.0–1.9)

## 2024-07-15 LAB — BASIC METABOLIC PANEL WITH GFR
BUN: 16 mg/dL (ref 6–23)
CO2: 26 meq/L (ref 19–32)
Calcium: 9.5 mg/dL (ref 8.4–10.5)
Chloride: 101 meq/L (ref 96–112)
Creatinine, Ser: 1.02 mg/dL (ref 0.40–1.50)
GFR: 68.69 mL/min (ref >60.00)
Glucose, Bld: 106 mg/dL — ABNORMAL HIGH (ref 70–99)
Potassium: 4.6 meq/L (ref 3.5–5.1)
Sodium: 138 meq/L (ref 135–145)

## 2024-07-15 NOTE — Patient Instructions (Addendum)
 You got a flu shot today. Recommend a COVID booster this fall  Check the  blood pressure regularly Blood pressure goal:  between 110/65 and  135/85. If it is consistently higher or lower, let me know     GO TO THE LAB :  Get the blood work   Your results will be posted on MyChart with my comments  Go to the front desk for the checkout Please make an appointment for a checkup in 4 to 5 months

## 2024-07-15 NOTE — Assessment & Plan Note (Signed)
 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  Check A1c.  He follows good feet care practices.  Dietary advice provided. Morbid obesity, BMI is 40, admits to overeating, has been overweight for many years and unable to lose weight.  I recommended GLP-1's, he prefers to work on diet in the next few months and if is not working he plans to reach out and perhaps start medications. HTN: BP looks good, ambulatory BPs in the 130s, on carvedilol , losartan , Aldactone .  Check BMP High cholesterol: On Crestor , check FLP. MSK: Has back pain, hip pain, sees Dr. Ernie, on Celebrex  daily.  Checking a BMP today. B12 deficiency: On supplements, last levels okay. Stage IV angioimmunoblastic T-cell NHL, on remission Preventive care: Flu shot today, recommend a COVID booster RTC 4 months

## 2024-07-15 NOTE — Progress Notes (Signed)
 Subjective:    Patient ID: [name] [name], male    DOB: [birth year], 83 y.o.   MRN: [id number]  DOS:  07/15/2024 Type of visit - description: Follow-up  Chronic medical problems addressed. In general feeling well. Does take Celebrex  daily due to back and hip pain. Continue with mild feet numbness, no burning. Weight gain noted, admits to overeating  Wt Readings from Last 3 Encounters:  07/15/24 295 lb (133.8 kg)  04/11/24 275 lb (124.7 kg)  04/03/24 288 lb 1.3 oz (130.7 kg)    Review of Systems See above   Past Medical History:  Diagnosis Date   Arthritis    Complication of anesthesia     had spinal with knee surgeries  -heart rate too low for general   Detached retina, left    L eye normal vision, reports that he has had a retina procedure in a doctor's office at [facility]    Diabetes mellitus without complication (HCC)    Dysrhythmia    slight irregular rate   GERD (gastroesophageal reflux disease)    hx no problems now   H/O echocardiogram 2014   History of hiatal hernia    ?20 yrs ago   Hyperopia 2016   Hypertension    Lumbar stenosis with neurogenic claudication    Lymphoma (HCC)    MG, ocular (myasthenia gravis) (HCC)    ? of L eye,    Pneumonia    Prediabetes    Presbyopia OU 2016    Past Surgical History:  Procedure Laterality Date   COLONOSCOPY     HERNIA REPAIR     bilateral inguinal hernia   HERNIA REPAIR     umbilical   IR IMAGING GUIDED PORT INSERTION  09/16/2019   IR REMOVAL TUN ACCESS W/ PORT W/O FL MOD SED  03/04/2020   JOINT REPLACEMENT  2009   right knee   LUMBAR LAMINECTOMY/DECOMPRESSION MICRODISCECTOMY N/A 06/06/2017   Procedure: Decompression L3-5, insitu fusion L3-5 ;  Surgeon: Burnetta Aures, MD;  Location: [facility] OR;  Service: Orthopedics;  Laterality: N/A;   LYMPH NODE BIOPSY Right 09/01/2019   Procedure: CERVICAL LYMPH NODE BIOPSY;  Surgeon: Arlana Arnt, MD;  Location: [facility] OR;  Service: ENT;  Laterality: Right;   MIDDLE EAR SURGERY Right     ear -patched hole in ear drum   RIGHT/LEFT HEART CATH AND CORONARY ANGIOGRAPHY N/A 11/11/2019   Procedure: RIGHT/LEFT HEART CATH AND CORONARY ANGIOGRAPHY;  Surgeon: Cherrie Toribio SAUNDERS, MD;  Location: [facility];  Service: Cardiovascular;  Laterality: N/A;   TOTAL HIP ARTHROPLASTY Left 07/17/2023   Procedure: TOTAL HIP ARTHROPLASTY ANTERIOR APPROACH;  Surgeon: Ernie Cough, MD;  Location: [facility] ORS;  Service: Orthopedics;  Laterality: Left;   TOTAL KNEE ARTHROPLASTY  09/10/2012   Procedure: TOTAL KNEE ARTHROPLASTY;  Surgeon: Tanda DELENA Heading, MD;  Location: [facility];  Service: Orthopedics;  Laterality: Left;   TOTAL SHOULDER ARTHROPLASTY Right 06/22/2016   Procedure: RIGHT TOTAL SHOULDER ARTHROPLASTY;  Surgeon: Franky Pointer, MD;  Location: [facility] OR;  Service: Orthopedics;  Laterality: Right;    Current Outpatient Medications  Medication Instructions   allopurinol  (ZYLOPRIM ) 300 mg, Oral, Daily   aspirin  EC 81 mg, Daily   carvedilol  (COREG ) 12.5 mg, Oral, 2 times daily   celecoxib  (CELEBREX ) 200 mg, Daily   cyanocobalamin  (VITAMIN B12) 500 mcg, Oral, Daily   diphenhydramine -acetaminophen  (TYLENOL  PM) 25-500 MG TABS tablet 1 tablet, Daily at bedtime   Fish Oil 1,000 mg, Daily   glucose blood (ONETOUCH  VERIO) test strip Use to check blood glucose once a day (Dx: 11.9 - type 2 DM)   losartan  (COZAAR ) 100 mg, Oral, Daily   metFORMIN  (GLUCOPHAGE ) 500 mg, Oral, Daily with breakfast   rosuvastatin  (CRESTOR ) 10 mg, Oral, Daily   spironolactone  (ALDACTONE ) 12.5 mg, Oral, Daily       Objective:   Physical Exam BP 136/66   Pulse (!) 50   Temp 98 F (36.7 C) (Oral)   Resp 18   Ht 6' (1.829 m)   Wt 295 lb (133.8 kg)   SpO2 97%   BMI 40.01 kg/m  General:   Well developed, NAD, BMI noted. HEENT:  Normocephalic . Face symmetric, atraumatic Lungs:  CTA B Normal respiratory effort, no intercostal retractions, no accessory muscle use. Heart: RRR,  no murmur.  DM foot exam: Good pedal  pulses, pinprick examination slightly decreased distally.  Skin with no open lesions or redness. Skin: Not pale. Not jaundice Neurologic:  alert & oriented X3.  Speech normal, gait appropriate for age and unassisted Psych--  Cognition and judgment appear intact.  Cooperative with normal attention span and concentration.  Behavior appropriate. No anxious or depressed appearing.      Assessment   Assessment DM + Neuropathy Dx 01-2021 HTN Hyperlipidemia Gout  Morbid obesity Snoring  CV: -- Palpitations, PVCs, h/o bradycardia,s/p. Dr Jennelle day monitor, echo 2014: Normal LV, some LVH  -- Transient LV dysfunction (new acute syst CHF 10/2019>>  S/p catheterization 11/11/2019: No CAD: Normal EF). 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  Check A1c.  He follows good feet care practices.  Dietary advice provided. Morbid obesity, BMI is 40, admits to overeating, has been overweight for many years and unable to lose weight.  I recommended GLP-1's, he prefers to work on diet in the next few months and if is not working he plans to reach out and perhaps start medications. HTN: BP looks good, ambulatory BPs in the 130s, on carvedilol , losartan , Aldactone .  Check BMP High cholesterol: On Crestor , check FLP. MSK: Has back pain, hip pain, sees Dr. Ernie, on Celebrex  daily.  Checking a BMP today. B12 deficiency: On supplements, last levels okay. Stage IV angioimmunoblastic T-cell NHL, on remission Preventive care: Flu shot today, recommend a COVID booster RTC 4 months

## 2024-07-16 DIAGNOSIS — C4361 Malignant melanoma of right upper limb, including shoulder: Secondary | ICD-10-CM | POA: Diagnosis not present

## 2024-07-17 ENCOUNTER — Ambulatory Visit: Payer: Self-pay | Admitting: Internal Medicine

## 2024-08-04 DIAGNOSIS — C439 Malignant melanoma of skin, unspecified: Secondary | ICD-10-CM | POA: Insufficient documentation

## 2024-08-20 ENCOUNTER — Telehealth (HOSPITAL_BASED_OUTPATIENT_CLINIC_OR_DEPARTMENT_OTHER): Payer: Self-pay

## 2024-08-20 ENCOUNTER — Telehealth: Payer: Self-pay

## 2024-08-20 DIAGNOSIS — C4361 Malignant melanoma of right upper limb, including shoulder: Secondary | ICD-10-CM | POA: Diagnosis not present

## 2024-08-20 NOTE — Telephone Encounter (Signed)
   Name: [name] [name]  DOB: 1941/08/09  MRN: [id number]  Primary Cardiologist: Aleene Passe, MD (Inactive)   Preoperative team, please contact this patient and set up a phone call appointment for further preoperative risk assessment. Please obtain consent and complete medication review. Thank you for your help.  I confirm that guidance regarding antiplatelet and oral anticoagulation therapy has been completed and, if necessary, noted below.  Per office protocol, if patient is without any new symptoms or concerns at the time of their virtual visit, he may hold ASA for 7 days prior to procedure. Please resume ASA as soon as possible postprocedure, at the discretion of the surgeon.    I also confirmed the patient resides in the state of [place] . As per [facility] Medical Board telemedicine laws, the patient must reside in the state in which the provider is licensed.   Lamarr Satterfield, NP 08/20/2024, 1:25 PM [facility]

## 2024-08-20 NOTE — Telephone Encounter (Signed)
   Pre-operative Risk Assessment    Patient Name: [name] [name]  DOB: 07/03/1941 MRN: [id number]   Date of last office visit: 01/24/2024 - Daniel Bensimhon. MD and 10/28/2010 - Aleene Jernigan, PA   Date of next office visit: N/A  Request for Surgical Clearance    Procedure:  Wide local excision of malignant melanoma Rt upper extremity w/SLN Bx  Date of Surgery:  Clearance 09/10/24                                Surgeon:  Dr, Allana Surgeon's Group or Practice Name:  [facility] [place] Phone number:  [phone] Fax number:  [phone]   Type of Clearance Requested:   - Medical  - Pharmacy:  Hold Aspirin  Please advise   Type of Anesthesia:  General    Additional requests/questions:  N/A  SignedPatrcia Hong L   08/20/2024, 12:05 PM

## 2024-08-20 NOTE — Telephone Encounter (Signed)
  Patient Consent for Virtual Visit        [name] [name] has provided verbal consent on 08/20/2024 for a virtual visit (video or telephone).   CONSENT FOR VIRTUAL VISIT FOR:  [name] [name]  By participating in this virtual visit I agree to the following:  I hereby voluntarily request, consent and authorize [facility] and its employed or contracted physicians, physician assistants, nurse practitioners or other licensed health care professionals (the Practitioner), to provide me with telemedicine health care services (the "Services) as deemed necessary by the treating Practitioner. I acknowledge and consent to receive the Services by the Practitioner via telemedicine. I understand that the telemedicine visit will involve communicating with the Practitioner through live audiovisual communication technology and the disclosure of certain medical information by electronic transmission. I acknowledge that I have been given the opportunity to request an in-person assessment or other available alternative prior to the telemedicine visit and am voluntarily participating in the telemedicine visit.  I understand that I have the right to withhold or withdraw my consent to the use of telemedicine in the course of my care at any time, without affecting my right to future care or treatment, and that the Practitioner or I may terminate the telemedicine visit at any time. I understand that I have the right to inspect all information obtained and/or recorded in the course of the telemedicine visit and may receive copies of available information for a reasonable fee.  I understand that some of the potential risks of receiving the Services via telemedicine include:  Delay or interruption in medical evaluation due to technological equipment failure or disruption; Information transmitted may not be sufficient (e.g. poor resolution of images) to allow for appropriate medical decision making by the Practitioner;  and/or  In rare instances, security protocols could fail, causing a breach of personal health information.  Furthermore, I acknowledge that it is my responsibility to provide information about my medical history, conditions and care that is complete and accurate to the best of my ability. I acknowledge that Practitioner's advice, recommendations, and/or decision may be based on factors not within their control, such as incomplete or inaccurate data provided by me or distortions of diagnostic images or specimens that may result from electronic transmissions. I understand that the practice of medicine is not an exact science and that Practitioner makes no warranties or guarantees regarding treatment outcomes. I acknowledge that a copy of this consent can be made available to me via my patient portal [facility] MyChart), or I can request a printed copy by calling the office of [facility].    I understand that my insurance will be billed for this visit.   I have read or had this consent read to me. I understand the contents of this consent, which adequately explains the benefits and risks of the Services being provided via telemedicine.  I have been provided ample opportunity to ask questions regarding this consent and the Services and have had my questions answered to my satisfaction. I give my informed consent for the services to be provided through the use of telemedicine in my medical care

## 2024-08-20 NOTE — Telephone Encounter (Signed)
 Preop tele appt now scheduled, med rec and consent done.

## 2024-08-29 ENCOUNTER — Ambulatory Visit: Attending: Cardiology | Admitting: Cardiology

## 2024-08-29 DIAGNOSIS — Z01810 Encounter for preprocedural cardiovascular examination: Secondary | ICD-10-CM | POA: Diagnosis not present

## 2024-08-29 DIAGNOSIS — Z01818 Encounter for other preprocedural examination: Secondary | ICD-10-CM

## 2024-08-29 NOTE — Progress Notes (Signed)
 Virtual Visit via Telephone Note   Because of BURMAN BRUINGTON co-morbid illnesses, he is at least at moderate risk for complications without adequate follow up.  This format is felt to be most appropriate for this patient at this time.  Due to technical limitations with video connection web designer), today's appointment will be conducted as an audio only telehealth visit, and FREDERICH MONTILLA verbally agreed to proceed in this manner.   All issues noted in this document were discussed and addressed.  No physical exam could be performed with this format.  Evaluation Performed:  Preoperative cardiovascular risk assessment _____________   Date:  08/29/2024   Patient ID:  [name] [name], DOB 1941-06-21, MRN [id number] Patient Location:  Home Provider location:   Office  Primary Care Provider:  Amon Aloysius BRAVO, MD Primary Cardiologist:  Aleene Passe, MD (Inactive)  Chief Complaint / Patient Profile   83 y.o. y/o male with a h/o nonobstructive CAD, hypertension, dyslipidemia, DM2, T-cell lymphoma, HFmrEF, who is pending wide local excision of malignant melanoma rt upper extremity w/SLN Bx and presents today for telephonic preoperative cardiovascular risk assessment.  History of Present Illness    [name] [name] is a 83 y.o. male who presents via audio conferencing for a telehealth visit today.  Pt was last seen in cardiology clinic on 01/24/2024 by Dr. Bensimhon.  At that time TERYL MCCONAGHY was doing well.  The patient is now pending procedure as outlined above. Since his last visit, he has been doing well, stays very physically active at his house and walks for exercise most days of the week.  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 He denies chest pain, palpitations, dyspnea, pnd, orthopnea, n, v, dizziness, syncope, edema, weight gain, or early satiety.    Past Medical History    Past Medical History:  Diagnosis Date   Arthritis    Complication of anesthesia     had spinal with knee  surgeries  -heart rate too low for general   Detached retina, left    L eye normal vision, reports that he has had a retina procedure in a doctor's office at [facility]    Diabetes mellitus without complication (HCC)    Dysrhythmia    slight irregular rate   GERD (gastroesophageal reflux disease)    hx no problems now   H/O echocardiogram 2014   History of hiatal hernia    ?20 yrs ago   Hyperopia 2016   Hypertension    Lumbar stenosis with neurogenic claudication    Lymphoma (HCC)    Melanoma (HCC)    MG, ocular (myasthenia gravis) (HCC)    ? of L eye,    Pneumonia    Prediabetes    Presbyopia OU 2016   Past Surgical History:  Procedure Laterality Date   COLONOSCOPY     HERNIA REPAIR     bilateral inguinal hernia   HERNIA REPAIR     umbilical   IR IMAGING GUIDED PORT INSERTION  09/16/2019   IR REMOVAL TUN ACCESS W/ PORT W/O FL MOD SED  03/04/2020   JOINT REPLACEMENT  2009   right knee   LUMBAR LAMINECTOMY/DECOMPRESSION MICRODISCECTOMY N/A 06/06/2017   Procedure: Decompression L3-5, insitu fusion L3-5 ;  Surgeon: Burnetta Aures, MD;  Location: [facility] OR;  Service: Orthopedics;  Laterality: N/A;   LYMPH NODE BIOPSY Right 09/01/2019   Procedure: CERVICAL LYMPH NODE BIOPSY;  Surgeon: Arlana Arnt, MD;  Location: [facility] OR;  Service: ENT;  Laterality: Right;   MIDDLE EAR SURGERY Right    ear -patched hole in ear drum   RIGHT/LEFT HEART CATH AND CORONARY ANGIOGRAPHY N/A 11/11/2019   Procedure: RIGHT/LEFT HEART CATH AND CORONARY ANGIOGRAPHY;  Surgeon: Cherrie Toribio SAUNDERS, MD;  Location: [facility];  Service: Cardiovascular;  Laterality: N/A;   TOTAL HIP ARTHROPLASTY Left 07/17/2023   Procedure: TOTAL HIP ARTHROPLASTY ANTERIOR APPROACH;  Surgeon: Ernie Cough, MD;  Location: [facility];  Service: Orthopedics;  Laterality: Left;   TOTAL KNEE ARTHROPLASTY  09/10/2012   Procedure: TOTAL KNEE ARTHROPLASTY;  Surgeon: Tanda DELENA Heading, MD;  Location: [facility];  Service: Orthopedics;  Laterality:  Left;   TOTAL SHOULDER ARTHROPLASTY Right 06/22/2016   Procedure: RIGHT TOTAL SHOULDER ARTHROPLASTY;  Surgeon: Franky Pointer, MD;  Location: [facility] OR;  Service: Orthopedics;  Laterality: Right;    Allergies  Allergies  Allergen Reactions   Doxycycline  Rash    Home Medications    Prior to Admission medications   Medication Sig Start Date End Date Taking? Authorizing Provider  allopurinol  (ZYLOPRIM ) 300 MG tablet TAKE 1 TABLET BY MOUTH EVERY DAY 05/30/24   Timmy Maude SAUNDERS, MD  aspirin  EC 81 MG tablet Take 81 mg by mouth daily. Swallow whole.    [provider]  carvedilol  (COREG ) 12.5 MG tablet Take 1 tablet (12.5 mg total) by mouth 2 (two) times daily. 08/30/23   Amon Aloysius BRAVO, MD  celecoxib  (CELEBREX ) 200 MG capsule Take 200 mg by mouth daily. 03/06/24   [provider]  cyanocobalamin  (VITAMIN B12) 500 MCG tablet Take 1 tablet (500 mcg total) by mouth daily. 11/12/23   [name], Jose E, MD  diphenhydramine -acetaminophen  (TYLENOL  PM) 25-500 MG TABS tablet Take 1 tablet by mouth at bedtime.    [provider]  glucose blood (ONETOUCH VERIO) test strip Use to check blood glucose once a day (Dx: 11.9 - type 2 DM) 05/31/22   Amon Aloysius BRAVO, MD  losartan  (COZAAR ) 100 MG tablet Take 1 tablet (100 mg total) by mouth daily. 04/03/24   Amon Aloysius BRAVO, MD  metFORMIN  (GLUCOPHAGE ) 500 MG tablet Take 1 tablet (500 mg total) by mouth daily with breakfast. 08/30/23   [name], Jose E, MD  Omega-3 Fatty Acids (FISH OIL) 1000 MG CAPS Take 1,000 mg by mouth daily.    [provider]  rosuvastatin  (CRESTOR ) 10 MG tablet TAKE 1 TABLET BY MOUTH EVERY DAY 04/04/24   Bensimhon, Toribio SAUNDERS, MD  spironolactone  (ALDACTONE ) 25 MG tablet Take 0.5 tablets (12.5 mg total) by mouth daily. 04/03/24   Timmy Maude SAUNDERS, MD    Physical Exam    Vital Signs:  Lynwood JULIANNA Hedge does not have vital signs available for review today.  Given telephonic nature of communication, physical exam is limited. AAOx3. NAD. Normal  affect.  Speech and respirations are unlabored.  Accessory Clinical Findings    None  Assessment & Plan    1.  Preoperative Cardiovascular Risk Assessment: According to the Revised Cardiac Risk Index (RCRI), his Perioperative Risk of Major Cardiac Event is (%): 0.9 His Functional Capacity in METs is: 6.61 according to the [facility] Activity Status Index (DASI). Therefore, based on ACC/AHA guidelines, patient would be at acceptable risk for the planned procedure without further cardiovascular testing. I will route this recommendation to the requesting party via Epic fax function.   Per office protocol he may hold ASA for 7 days prior to procedure. Please resume ASA as soon as possible postprocedure, at the discretion of  the surgeon.      A copy of this note will be routed to requesting surgeon.  Time:   Today, I have spent 10 minutes with the patient with telehealth technology discussing medical history, symptoms, and management plan.     Delon JAYSON Hoover, NP  08/29/2024, 7:53 AM

## 2024-09-05 DIAGNOSIS — R590 Localized enlarged lymph nodes: Secondary | ICD-10-CM | POA: Diagnosis not present

## 2024-09-05 DIAGNOSIS — Z7984 Long term (current) use of oral hypoglycemic drugs: Secondary | ICD-10-CM | POA: Diagnosis not present

## 2024-09-05 DIAGNOSIS — E119 Type 2 diabetes mellitus without complications: Secondary | ICD-10-CM | POA: Diagnosis not present

## 2024-09-05 DIAGNOSIS — Z7982 Long term (current) use of aspirin: Secondary | ICD-10-CM | POA: Diagnosis not present

## 2024-09-05 DIAGNOSIS — I10 Essential (primary) hypertension: Secondary | ICD-10-CM | POA: Diagnosis not present

## 2024-09-05 DIAGNOSIS — Z79899 Other long term (current) drug therapy: Secondary | ICD-10-CM | POA: Diagnosis not present

## 2024-09-05 DIAGNOSIS — C4361 Malignant melanoma of right upper limb, including shoulder: Secondary | ICD-10-CM | POA: Diagnosis not present

## 2024-09-05 DIAGNOSIS — L905 Scar conditions and fibrosis of skin: Secondary | ICD-10-CM | POA: Diagnosis not present

## 2024-09-26 ENCOUNTER — Encounter: Payer: Self-pay | Admitting: Podiatry

## 2024-09-26 ENCOUNTER — Ambulatory Visit: Admitting: Podiatry

## 2024-09-26 DIAGNOSIS — M79675 Pain in left toe(s): Secondary | ICD-10-CM

## 2024-09-26 DIAGNOSIS — L608 Other nail disorders: Secondary | ICD-10-CM

## 2024-09-26 DIAGNOSIS — M79674 Pain in right toe(s): Secondary | ICD-10-CM

## 2024-09-26 DIAGNOSIS — E1169 Type 2 diabetes mellitus with other specified complication: Secondary | ICD-10-CM

## 2024-09-26 DIAGNOSIS — B351 Tinea unguium: Secondary | ICD-10-CM

## 2024-09-26 NOTE — Progress Notes (Signed)
This patient returns to my office for at risk foot care.  This patient requires this care by a professional since this patient will be at risk due to having diabetes type 2. This patient is having problems with his left knee and hip.  This patient is unable to cut nails himself since the patient cannot reach his nails.These nails are painful walking and wearing shoes.  This patient presents for at risk foot care today.  General Appearance  Alert, conversant and in no acute stress.  Vascular  Dorsalis pedis  are palpable  bilaterally. Posterior tibial pulses are absent  B/L. Capillary return is within normal limits  bilaterally. Cold feet  Bilaterally. Absent hair  B/L.  Neurologic  Senn-Weinstein monofilament wire test within normal limits/diminished   bilaterally. Muscle power within normal limits bilaterally.  Nails Thick disfigured discolored nails with subungual debris  Hallux nails. No evidence of bacterial infection or drainage bilaterally.  Orthopedic  No limitations of motion  feet .  No crepitus or effusions noted.  No bony pathology or digital deformities noted.  HAV  B/L.  Skin  normotropic skin with no porokeratosis noted bilaterally.  No signs of infections or ulcers noted.     Onychomycosis  Pain in right toes  Pain in left toes  Consent was obtained for treatment procedures.   Mechanical debridement of nails 1-5  bilaterally performed with a nail nipper.  Filed with dremel without incident.    Return office visit     3 months.                Told patient to return for periodic foot care and evaluation due to potential at risk complications.   Nieshia Larmon DPM  

## 2024-09-29 ENCOUNTER — Other Ambulatory Visit: Payer: Self-pay | Admitting: Internal Medicine

## 2024-10-02 ENCOUNTER — Inpatient Hospital Stay: Attending: Hematology & Oncology

## 2024-10-02 ENCOUNTER — Telehealth: Payer: Self-pay

## 2024-10-02 ENCOUNTER — Encounter: Payer: Self-pay | Admitting: Hematology & Oncology

## 2024-10-02 ENCOUNTER — Telehealth: Payer: Self-pay | Admitting: Internal Medicine

## 2024-10-02 ENCOUNTER — Ambulatory Visit: Admitting: Hematology & Oncology

## 2024-10-02 VITALS — BP 139/64 | HR 60 | Temp 97.7°F | Resp 20 | Ht 72.0 in | Wt 300.0 lb

## 2024-10-02 DIAGNOSIS — Z79899 Other long term (current) drug therapy: Secondary | ICD-10-CM | POA: Diagnosis not present

## 2024-10-02 DIAGNOSIS — C4361 Malignant melanoma of right upper limb, including shoulder: Secondary | ICD-10-CM

## 2024-10-02 DIAGNOSIS — C8440 Peripheral T-cell lymphoma, not classified, unspecified site: Secondary | ICD-10-CM | POA: Diagnosis not present

## 2024-10-02 DIAGNOSIS — C8651 Angioimmunoblastic t-cell lymphoma, in remission: Secondary | ICD-10-CM | POA: Diagnosis present

## 2024-10-02 DIAGNOSIS — E1169 Type 2 diabetes mellitus with other specified complication: Secondary | ICD-10-CM

## 2024-10-02 DIAGNOSIS — I152 Hypertension secondary to endocrine disorders: Secondary | ICD-10-CM

## 2024-10-02 DIAGNOSIS — E119 Type 2 diabetes mellitus without complications: Secondary | ICD-10-CM | POA: Diagnosis not present

## 2024-10-02 DIAGNOSIS — Z9221 Personal history of antineoplastic chemotherapy: Secondary | ICD-10-CM | POA: Diagnosis not present

## 2024-10-02 LAB — CBC WITH DIFFERENTIAL (CANCER CENTER ONLY)
Abs Immature Granulocytes: 0.04 K/uL (ref 0.00–0.07)
Basophils Absolute: 0.1 K/uL (ref 0.0–0.1)
Basophils Relative: 1 %
Eosinophils Absolute: 0.3 K/uL (ref 0.0–0.5)
Eosinophils Relative: 4 %
HCT: 39.7 % (ref 39.0–52.0)
Hemoglobin: 13.4 g/dL (ref 13.0–17.0)
Immature Granulocytes: 1 %
Lymphocytes Relative: 21 %
Lymphs Abs: 1.4 K/uL (ref 0.7–4.0)
MCH: 32.1 pg (ref 26.0–34.0)
MCHC: 33.8 g/dL (ref 30.0–36.0)
MCV: 95.2 fL (ref 80.0–100.0)
Monocytes Absolute: 0.8 K/uL (ref 0.1–1.0)
Monocytes Relative: 12 %
Neutro Abs: 4.3 K/uL (ref 1.7–7.7)
Neutrophils Relative %: 61 %
Platelet Count: 206 K/uL (ref 150–400)
RBC: 4.17 MIL/uL — ABNORMAL LOW (ref 4.22–5.81)
RDW: 12.5 % (ref 11.5–15.5)
WBC Count: 7 K/uL (ref 4.0–10.5)
nRBC: 0 % (ref 0.0–0.2)

## 2024-10-02 LAB — CMP (CANCER CENTER ONLY)
ALT: 19 U/L (ref 0–44)
AST: 16 U/L (ref 15–41)
Albumin: 4.4 g/dL (ref 3.5–5.0)
Alkaline Phosphatase: 77 U/L (ref 38–126)
Anion gap: 11 (ref 5–15)
BUN: 21 mg/dL (ref 8–23)
CO2: 25 mmol/L (ref 22–32)
Calcium: 9.1 mg/dL (ref 8.9–10.3)
Chloride: 103 mmol/L (ref 98–111)
Creatinine: 1.13 mg/dL (ref 0.61–1.24)
GFR, Estimated: >60 mL/min (ref >60)
Glucose, Bld: 117 mg/dL — ABNORMAL HIGH (ref 70–99)
Potassium: 5 mmol/L (ref 3.5–5.1)
Sodium: 138 mmol/L (ref 135–145)
Total Bilirubin: 0.4 mg/dL (ref 0.0–1.2)
Total Protein: 6.8 g/dL (ref 6.5–8.1)

## 2024-10-02 LAB — LACTATE DEHYDROGENASE: LDH: 165 U/L (ref 105–235)

## 2024-10-02 MED ORDER — METFORMIN HCL 500 MG PO TABS: 500.0000 mg | ORAL_TABLET | Freq: Every day | ORAL | 1 refills | Status: AC

## 2024-10-02 NOTE — Telephone Encounter (Signed)
 Copied from CRM #[id number]. Topic: Clinical - Medication Refill >> Oct 02, 2024 11:54 AM [facility] wrote: Medication: metFORMIN  (GLUCOPHAGE ) 500 MG tablet  Has the patient contacted their pharmacy? Yes (Agent: If no, request that the patient contact the pharmacy for the refill. If patient does not wish to contact the pharmacy document the reason why and proceed with request.) (Agent: If yes, when and what did the pharmacy advise?)  This is the patient's preferred pharmacy:  [id number] [place], [place] - [address] [place] [address] Phone: [phone] Fax: [phone]  Is this the correct pharmacy for this prescription? Yes If no, delete pharmacy and type the correct one.   Has the prescription been filled recently? No  Is the patient out of the medication? Yes  Has the patient been seen for an appointment in the last year OR does the patient have an upcoming appointment? Yes  Can we respond through MyChart? No  Agent: Please be advised that Rx refills may take up to 3 business days. We ask that you follow-up with your pharmacy.

## 2024-10-02 NOTE — Progress Notes (Signed)
 This man Hematology and Oncology Follow Up Visit  MUHSIN [name] [id number] Nov 03, 1940 83 y.o. 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  Current Therapy:   S/p CHP + Adcetris  x 6 cycles -- completed on 01/19/2020     Interim History:  [name] is back for follow-up.  The big news is that he was found to have melanoma of the right arm.  He was referred to surgery.  This was back in October.  He had wide local excision.  Initial pathology on the shave biopsy which was done on 07/07/2024 showed a melanoma that was amelanotic.  It was at least Clark level III.  There was ulceration.  There was a brisk lymphocyte response.  I do not have the path report back from his wide local excision and axillary sentinel node biopsy.  Otherwise, he is doing okay.  He does have diabetes.  He ran out of his metformin  a couple weeks ago.  His wife still is in the nursing home with her dementia.  He has not had any problems with the left hip.  He has surgery for this little over a year ago.  He has had no problems with fever.  He has had no cough or shortness of breath.  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  Overall, I would say that his performance status is probably ECOG 1.    Medications:  Current Outpatient Medications:    allopurinol  (ZYLOPRIM ) 300 MG tablet, TAKE 1 TABLET BY MOUTH EVERY DAY, Disp: 90 tablet, Rfl: 1   aspirin  EC 81 MG tablet, Take 81 mg by mouth daily. Swallow whole., Disp: , Rfl:    carvedilol  (COREG ) 12.5 MG tablet, Take 1 tablet (12.5 mg total) by mouth 2 (two) times daily., Disp: 180 tablet, Rfl: 1   celecoxib  (CELEBREX ) 200 MG capsule, Take 200 mg by mouth daily., Disp: , Rfl:    cyanocobalamin  (VITAMIN B12) 500 MCG tablet, Take 1 tablet (500 mcg total) by mouth daily., Disp: , Rfl:    diphenhydramine -acetaminophen  (TYLENOL  PM) 25-500 MG TABS tablet, Take 1 tablet by mouth at bedtime., Disp: ,  Rfl:    losartan  (COZAAR ) 100 MG tablet, TAKE 1 TABLET BY MOUTH EVERY DAY, Disp: 90 tablet, Rfl: 1   Omega-3 Fatty Acids (FISH OIL) 1000 MG CAPS, Take 1,000 mg by mouth daily., Disp: , Rfl:    rosuvastatin  (CRESTOR ) 10 MG tablet, TAKE 1 TABLET BY MOUTH EVERY DAY, Disp: 90 tablet, Rfl: 3   glucose blood (ONETOUCH VERIO) test strip, Use to check blood glucose once a day (Dx: 11.9 - type 2 DM) (Patient not taking: Reported on 10/02/2024), Disp: 100 each, Rfl: 3   metFORMIN  (GLUCOPHAGE ) 500 MG tablet, Take 1 tablet (500 mg total) by mouth daily with breakfast. (Patient not taking: Reported on 10/02/2024), Disp: 90 tablet, Rfl: 1   spironolactone  (ALDACTONE ) 25 MG tablet, Take 0.5 tablets (12.5 mg total) by mouth daily. (Patient not taking: Reported on 10/02/2024), Disp: 90 tablet, Rfl: 3  Allergies:  Allergies  Allergen Reactions   Doxycycline  Rash    Past Medical History, Surgical history, Social history, and Family History were reviewed and updated.  Review of Systems: Review of Systems  Constitutional: Negative.   HENT:  Negative.    Eyes: Negative.   Respiratory:  Positive for shortness of breath.   Cardiovascular:  Positive for leg swelling.  Gastrointestinal:  Positive for nausea.  Endocrine: Negative.   Genitourinary:  Negative.    Musculoskeletal:  Positive for arthralgias.  Skin: Negative.   Neurological: Negative.   Hematological: Negative.   Psychiatric/Behavioral: Negative.      Physical Exam:  height is 6' (1.829 m) and weight is 300 lb (136.1 kg). His oral temperature is 97.7 F (36.5 C). His blood pressure is 162/64 (abnormal) and his pulse is 60. His respiration is 20 and oxygen saturation is 98%.   Wt Readings from Last 3 Encounters:  10/02/24 300 lb (136.1 kg)  07/15/24 295 lb (133.8 kg)  04/11/24 275 lb (124.7 kg)    Physical Exam Vitals reviewed.  HENT:     Head: Normocephalic and atraumatic.  Eyes:     Pupils: Pupils are equal, round, and reactive to  light.  Cardiovascular:     Rate and Rhythm: Normal rate and regular rhythm.     Heart sounds: Normal heart sounds.  Pulmonary:     Effort: Pulmonary effort is normal.     Breath sounds: Normal breath sounds.  Abdominal:     General: Bowel sounds are normal.     Palpations: Abdomen is soft.  Musculoskeletal:        General: No tenderness or deformity. Normal range of motion.     Cervical back: Normal range of motion.  Lymphadenopathy:     Cervical: No cervical adenopathy.  Skin:    General: Skin is warm and dry.     Findings: No erythema or rash.  Neurological:     Mental Status: He is alert and oriented to person, place, and time.  Psychiatric:        Behavior: Behavior normal.        Thought Content: Thought content normal.        Judgment: Judgment normal.      Lab Results  Component Value Date   WBC 7.0 10/02/2024   HGB 13.4 10/02/2024   HCT 39.7 10/02/2024   MCV 95.2 10/02/2024   PLT 206 10/02/2024     Chemistry      Component Value Date/Time   NA 138 10/02/2024 0828   K 5.0 10/02/2024 0828   CL 103 10/02/2024 0828   CO2 25 10/02/2024 0828   BUN 21 10/02/2024 0828   CREATININE 1.13 10/02/2024 0828   CREATININE 1.24 (H) 09/13/2022 1537      Component Value Date/Time   CALCIUM  9.1 10/02/2024 0828   ALKPHOS 77 10/02/2024 0828   AST 16 10/02/2024 0828   ALT 19 10/02/2024 0828   BILITOT 0.4 10/02/2024 0828      Impression and Plan:  [name] is a very nice 83 year old white male.  He had stage IV T-cell lymphoma.  This was an angioimmunoblastic lymphoma.  He was treated with chemotherapy and targeted therapy.  He completed 6 cycles of treatment back on 01/19/2020.  He went into remission.  He is still in remission.  Now, he has melanoma.  Again I really did see the pathology on this.  Hopefully this is just a stage I or maybe early stage II.  Thankfully, the Breslow depth was not all that bad.  The Breslow depth was 1.5 mm.  Again we will have to watch  this closely.  I want to see him back in 6 months still.  If there is note of the surgical pathology report that he might need adjuvant immunotherapy, I will get him back sooner.   Maude JONELLE Crease, MD 12/11/20259:17 AM

## 2024-10-02 NOTE — Telephone Encounter (Signed)
Rx faxed to [facility] 

## 2024-10-03 ENCOUNTER — Encounter: Payer: Self-pay | Admitting: *Deleted

## 2024-11-09 ENCOUNTER — Other Ambulatory Visit: Payer: Self-pay | Admitting: Hematology & Oncology

## 2024-11-09 DIAGNOSIS — C8440 Peripheral T-cell lymphoma, not classified, unspecified site: Secondary | ICD-10-CM

## 2024-11-10 ENCOUNTER — Encounter: Payer: Self-pay | Admitting: Hematology

## 2024-11-18 ENCOUNTER — Ambulatory Visit: Admitting: Internal Medicine

## 2024-12-15 ENCOUNTER — Ambulatory Visit: Admitting: Internal Medicine

## 2024-12-26 ENCOUNTER — Ambulatory Visit: Admitting: Podiatry

## 2025-04-02 ENCOUNTER — Inpatient Hospital Stay: Admitting: Hematology & Oncology

## 2025-04-02 ENCOUNTER — Inpatient Hospital Stay

## 2025-04-15 ENCOUNTER — Ambulatory Visit
